# Patient Record
Sex: Male | Born: 1945 | Race: White | Hispanic: No | Marital: Married | Smoking: Former smoker
Health system: Southern US, Community
[De-identification: ages and names within clinical notes are randomized; demographics above are authoritative.]

## PROBLEM LIST (undated history)

## (undated) DIAGNOSIS — R161 Splenomegaly, not elsewhere classified: Secondary | ICD-10-CM

## (undated) DIAGNOSIS — K648 Other hemorrhoids: Secondary | ICD-10-CM

## (undated) DIAGNOSIS — M1711 Unilateral primary osteoarthritis, right knee: Secondary | ICD-10-CM

## (undated) DIAGNOSIS — E785 Hyperlipidemia, unspecified: Secondary | ICD-10-CM

## (undated) DIAGNOSIS — R0789 Other chest pain: Secondary | ICD-10-CM

## (undated) DIAGNOSIS — K644 Residual hemorrhoidal skin tags: Secondary | ICD-10-CM

## (undated) DIAGNOSIS — I491 Atrial premature depolarization: Secondary | ICD-10-CM

## (undated) DIAGNOSIS — K921 Melena: Secondary | ICD-10-CM

## (undated) DIAGNOSIS — E782 Mixed hyperlipidemia: Secondary | ICD-10-CM

## (undated) DIAGNOSIS — Z860100 Personal history of colon polyps, unspecified: Secondary | ICD-10-CM

## (undated) DIAGNOSIS — N529 Male erectile dysfunction, unspecified: Secondary | ICD-10-CM

## (undated) DIAGNOSIS — R739 Hyperglycemia, unspecified: Secondary | ICD-10-CM

## (undated) DIAGNOSIS — N200 Calculus of kidney: Secondary | ICD-10-CM

## (undated) DIAGNOSIS — R002 Palpitations: Secondary | ICD-10-CM

## (undated) DIAGNOSIS — G43109 Migraine with aura, not intractable, without status migrainosus: Secondary | ICD-10-CM

## (undated) DIAGNOSIS — R7303 Prediabetes: Secondary | ICD-10-CM

## (undated) DIAGNOSIS — R079 Chest pain, unspecified: Secondary | ICD-10-CM

## (undated) DIAGNOSIS — K635 Polyp of colon: Secondary | ICD-10-CM

## (undated) DIAGNOSIS — K649 Unspecified hemorrhoids: Secondary | ICD-10-CM

## (undated) DIAGNOSIS — M542 Cervicalgia: Secondary | ICD-10-CM

## (undated) DIAGNOSIS — K219 Gastro-esophageal reflux disease without esophagitis: Secondary | ICD-10-CM

## (undated) HISTORY — DX: Hyperglycemia, unspecified: R73.9

## (undated) HISTORY — DX: Gastro-esophageal reflux disease without esophagitis: K21.9

## (undated) HISTORY — DX: Prediabetes: R73.03

## (undated) HISTORY — DX: Other hemorrhoids: K64.8

## (undated) HISTORY — DX: Hyperlipidemia, unspecified: E78.5

## (undated) HISTORY — DX: Cervicalgia: M54.2

## (undated) HISTORY — DX: Unilateral primary osteoarthritis, right knee: M17.11

## (undated) HISTORY — DX: Personal history of colonic polyps: Z86.010

## (undated) HISTORY — PX: BONE MARROW BIOPSY: SHX199

## (undated) HISTORY — DX: Male erectile dysfunction, unspecified: N52.9

## (undated) HISTORY — DX: Chest pain, unspecified: R07.9

## (undated) HISTORY — DX: Residual hemorrhoidal skin tags: K64.4

## (undated) HISTORY — DX: Polyp of colon: K63.5

## (undated) HISTORY — DX: Migraine with aura, not intractable, without status migrainosus: G43.109

## (undated) HISTORY — DX: Atrial premature depolarization: I49.1

## (undated) HISTORY — DX: Mixed hyperlipidemia: E78.2

## (undated) HISTORY — DX: Palpitations: R00.2

## (undated) HISTORY — DX: Other chest pain: R07.89

## (undated) HISTORY — DX: Calculus of kidney: N20.0

## (undated) HISTORY — PX: KIDNEY STONE SURGERY: SHX686

## (undated) HISTORY — DX: Personal history of colon polyps, unspecified: Z86.0100

## (undated) HISTORY — DX: Unspecified hemorrhoids: K64.9

## (undated) HISTORY — DX: Melena: K92.1

---

## 1999-02-24 ENCOUNTER — Encounter: Payer: Self-pay | Admitting: Emergency Medicine

## 1999-02-24 ENCOUNTER — Emergency Department (HOSPITAL_COMMUNITY): Admission: EM | Admit: 1999-02-24 | Discharge: 1999-02-24 | Payer: Self-pay | Admitting: Emergency Medicine

## 1999-02-25 ENCOUNTER — Encounter: Payer: Self-pay | Admitting: Urology

## 1999-02-25 ENCOUNTER — Inpatient Hospital Stay (HOSPITAL_COMMUNITY): Admission: EM | Admit: 1999-02-25 | Discharge: 1999-02-26 | Payer: Self-pay | Admitting: Emergency Medicine

## 1999-02-26 ENCOUNTER — Encounter: Payer: Self-pay | Admitting: Urology

## 2007-08-11 ENCOUNTER — Ambulatory Visit (HOSPITAL_BASED_OUTPATIENT_CLINIC_OR_DEPARTMENT_OTHER): Admission: RE | Admit: 2007-08-11 | Discharge: 2007-08-11 | Payer: Self-pay | Admitting: Urology

## 2009-01-06 ENCOUNTER — Emergency Department (HOSPITAL_COMMUNITY): Admission: EM | Admit: 2009-01-06 | Discharge: 2009-01-07 | Payer: Self-pay | Admitting: Emergency Medicine

## 2011-01-28 LAB — POCT I-STAT, CHEM 8
BUN: 13 mg/dL (ref 6–23)
Calcium, Ion: 1.19 mmol/L (ref 1.12–1.32)
Chloride: 104 mEq/L (ref 96–112)
Creatinine, Ser: 1 mg/dL (ref 0.4–1.5)
Glucose, Bld: 188 mg/dL — ABNORMAL HIGH (ref 70–99)
HCT: 44 % (ref 39.0–52.0)
Hemoglobin: 15 g/dL (ref 13.0–17.0)
Potassium: 3.7 mEq/L (ref 3.5–5.1)
Sodium: 140 mEq/L (ref 135–145)
TCO2: 25 mmol/L (ref 0–100)

## 2011-01-28 LAB — CBC
HCT: 43.5 % (ref 39.0–52.0)
Hemoglobin: 14.9 g/dL (ref 13.0–17.0)
MCHC: 34.2 g/dL (ref 30.0–36.0)
MCV: 90.3 fL (ref 78.0–100.0)
Platelets: 215 10*3/uL (ref 150–400)
RBC: 4.82 MIL/uL (ref 4.22–5.81)
RDW: 14.3 % (ref 11.5–15.5)
WBC: 6.6 10*3/uL (ref 4.0–10.5)

## 2011-01-28 LAB — DIFFERENTIAL
Basophils Absolute: 0 10*3/uL (ref 0.0–0.1)
Basophils Relative: 1 % (ref 0–1)
Eosinophils Absolute: 0.1 10*3/uL (ref 0.0–0.7)
Eosinophils Relative: 2 % (ref 0–5)
Lymphocytes Relative: 29 % (ref 12–46)
Lymphs Abs: 1.9 10*3/uL (ref 0.7–4.0)
Monocytes Absolute: 0.5 10*3/uL (ref 0.1–1.0)
Monocytes Relative: 7 % (ref 3–12)
Neutro Abs: 4 10*3/uL (ref 1.7–7.7)
Neutrophils Relative %: 61 % (ref 43–77)

## 2011-01-28 LAB — POCT CARDIAC MARKERS
CKMB, poc: <1 ng/mL — ABNORMAL LOW (ref 1.0–8.0)
CKMB, poc: <1 ng/mL — ABNORMAL LOW (ref 1.0–8.0)
Myoglobin, poc: 61.6 ng/mL (ref 12–200)
Myoglobin, poc: 75 ng/mL (ref 12–200)
Troponin i, poc: <0.05 ng/mL (ref 0.00–0.09)
Troponin i, poc: <0.05 ng/mL (ref 0.00–0.09)

## 2011-03-02 NOTE — Op Note (Signed)
NAMEJIMMY, Fernando Lee                 ACCOUNT NO.:  [id number]   MEDICAL RECORD NO.:  [id number]          PATIENT TYPE:  AMB   LOCATION:  [facility]                         FACILITY:  [facility]   PHYSICIAN:  Bertram Millard. Dahlstedt, M.D.DATE OF BIRTH:  Aug 28, 1946   DATE OF PROCEDURE:  08/11/2007  DATE OF DISCHARGE:                               OPERATIVE REPORT   PREOPERATIVE DIAGNOSIS:  Right distal ureteral stone, symptomatic.   POSTOPERATIVE DIAGNOSES:  Right distal ureteral stone, symptomatic.   OPERATION PERFORMED:  1. Right cystoscopy.  2. Right ureteroscopic stone extraction.   SURGEON:  Bertram Millard. Dahlstedt, M.D.   ANESTHESIA:  General with LMA.   COMPLICATIONS:  None.   BRIEF HISTORY:  This is a 65 year old male with recurrent lower urinary  tract symptoms.  This occurred over a period of a few months.  He has a  history of stone disease.  Because of significant right lower quadrant  pain he eventually underwent CT urogram, which showed a 5 x 8 mm right  distal ureteral calculus.  Since he had been symptomatic for quite a few  months, it was evident that this stone would not pass.  He presented at  this time for ureteroscopic stone extraction.  Risks and complications  had been discussed with the patient.  He understood and desired to  proceed.   DESCRIPTION OF THE OPERATION:  The patient was administered preoperative  IV antibiotics and was brought to the operating room after the surgical  side was marked.  He was placed in the dorsal lithotomy position after a  general anesthetic was administered using LMA.  Genitalia and perineum  were prepped and draped.  The 22 French panendoscope was advanced into  his bladder.  Urethra was normal.  Prostate was normal.  No bladder  lesions were noted.  The right ureteral orifice was cannulated with a  Glidewire, which was advanced to the right renal pelvis using  fluoroscopic guidance.  The scope was then removed.  The distal ureter  was  dilated to a 23 [place] with ureteral access sheath.  At this point  the ureteroscope was passed.  The stone was encountered at about 3-4 cm  up the ureter, grasped and extracted without difficulty.  The bladder  was drained.  The procedure was terminated.   The patient tolerated the procedure well.  He was awakened and taken to  PACU in stable condition.  He will be discharged on Percocet #20 and  Bactrim DS 1 by mouth twice a day for 5 days.  He will follow up in 1-2  weeks.     Bertram Millard. Dahlstedt, M.D.  Electronically Signed    SMD/MEDQ  D:  08/11/2007  T:  08/13/2007  Job:  [id number]

## 2011-07-28 LAB — POCT HEMOGLOBIN-HEMACUE
Hemoglobin: 16.1 — ABNORMAL HIGH
Operator id: 268271

## 2012-03-08 ENCOUNTER — Encounter: Payer: Self-pay | Admitting: *Deleted

## 2012-03-08 DIAGNOSIS — R0789 Other chest pain: Secondary | ICD-10-CM | POA: Insufficient documentation

## 2012-03-08 DIAGNOSIS — E785 Hyperlipidemia, unspecified: Secondary | ICD-10-CM | POA: Insufficient documentation

## 2012-03-08 DIAGNOSIS — N200 Calculus of kidney: Secondary | ICD-10-CM | POA: Insufficient documentation

## 2012-08-24 ENCOUNTER — Encounter: Payer: Self-pay | Admitting: *Deleted

## 2012-12-28 ENCOUNTER — Encounter (INDEPENDENT_AMBULATORY_CARE_PROVIDER_SITE_OTHER): Payer: Medicare Other | Admitting: Ophthalmology

## 2012-12-28 DIAGNOSIS — H33309 Unspecified retinal break, unspecified eye: Secondary | ICD-10-CM

## 2012-12-28 DIAGNOSIS — H2510 Age-related nuclear cataract, unspecified eye: Secondary | ICD-10-CM

## 2012-12-28 DIAGNOSIS — H3530 Unspecified macular degeneration: Secondary | ICD-10-CM

## 2012-12-28 DIAGNOSIS — H43819 Vitreous degeneration, unspecified eye: Secondary | ICD-10-CM

## 2012-12-28 DIAGNOSIS — D3130 Benign neoplasm of unspecified choroid: Secondary | ICD-10-CM

## 2013-12-28 ENCOUNTER — Ambulatory Visit (INDEPENDENT_AMBULATORY_CARE_PROVIDER_SITE_OTHER): Payer: Commercial Managed Care - HMO | Admitting: Ophthalmology

## 2013-12-28 DIAGNOSIS — H43819 Vitreous degeneration, unspecified eye: Secondary | ICD-10-CM

## 2013-12-28 DIAGNOSIS — H33309 Unspecified retinal break, unspecified eye: Secondary | ICD-10-CM

## 2013-12-28 DIAGNOSIS — H2510 Age-related nuclear cataract, unspecified eye: Secondary | ICD-10-CM

## 2013-12-28 DIAGNOSIS — H3530 Unspecified macular degeneration: Secondary | ICD-10-CM

## 2013-12-28 DIAGNOSIS — D3130 Benign neoplasm of unspecified choroid: Secondary | ICD-10-CM

## 2014-11-13 ENCOUNTER — Ambulatory Visit
Admission: RE | Admit: 2014-11-13 | Discharge: 2014-11-13 | Disposition: A | Discharge status: Home / Self Care | Payer: Medicare Other | Source: Ambulatory Visit | Attending: Family Medicine | Admitting: Family Medicine

## 2014-11-13 ENCOUNTER — Other Ambulatory Visit: Payer: Self-pay | Admitting: Family Medicine

## 2014-11-13 DIAGNOSIS — R52 Pain, unspecified: Secondary | ICD-10-CM

## 2014-11-13 DIAGNOSIS — W19XXXA Unspecified fall, initial encounter: Secondary | ICD-10-CM

## 2015-01-02 ENCOUNTER — Ambulatory Visit (INDEPENDENT_AMBULATORY_CARE_PROVIDER_SITE_OTHER): Payer: Medicare Other | Admitting: Ophthalmology

## 2015-01-02 DIAGNOSIS — H3531 Nonexudative age-related macular degeneration: Secondary | ICD-10-CM

## 2015-01-02 DIAGNOSIS — H2513 Age-related nuclear cataract, bilateral: Secondary | ICD-10-CM | POA: Diagnosis not present

## 2015-01-02 DIAGNOSIS — H33302 Unspecified retinal break, left eye: Secondary | ICD-10-CM | POA: Diagnosis not present

## 2015-01-02 DIAGNOSIS — D3131 Benign neoplasm of right choroid: Secondary | ICD-10-CM | POA: Diagnosis not present

## 2015-01-02 DIAGNOSIS — H43813 Vitreous degeneration, bilateral: Secondary | ICD-10-CM

## 2015-04-28 ENCOUNTER — Ambulatory Visit
Admission: RE | Admit: 2015-04-28 | Discharge: 2015-04-28 | Disposition: A | Discharge status: Home / Self Care | Payer: Medicare Other | Source: Ambulatory Visit | Attending: Family Medicine | Admitting: Family Medicine

## 2015-04-28 ENCOUNTER — Other Ambulatory Visit: Payer: Self-pay | Admitting: Family Medicine

## 2015-04-28 DIAGNOSIS — M25562 Pain in left knee: Secondary | ICD-10-CM

## 2015-12-26 ENCOUNTER — Ambulatory Visit (INDEPENDENT_AMBULATORY_CARE_PROVIDER_SITE_OTHER): Payer: Medicare Other | Admitting: Ophthalmology

## 2015-12-26 DIAGNOSIS — H33302 Unspecified retinal break, left eye: Secondary | ICD-10-CM

## 2015-12-26 DIAGNOSIS — H353131 Nonexudative age-related macular degeneration, bilateral, early dry stage: Secondary | ICD-10-CM

## 2015-12-26 DIAGNOSIS — H2513 Age-related nuclear cataract, bilateral: Secondary | ICD-10-CM

## 2015-12-26 DIAGNOSIS — D3131 Benign neoplasm of right choroid: Secondary | ICD-10-CM | POA: Diagnosis not present

## 2015-12-26 DIAGNOSIS — H43813 Vitreous degeneration, bilateral: Secondary | ICD-10-CM

## 2016-01-02 ENCOUNTER — Ambulatory Visit (INDEPENDENT_AMBULATORY_CARE_PROVIDER_SITE_OTHER): Payer: Medicare Other | Admitting: Ophthalmology

## 2016-12-27 ENCOUNTER — Encounter (INDEPENDENT_AMBULATORY_CARE_PROVIDER_SITE_OTHER): Payer: Medicare Other | Admitting: Ophthalmology

## 2016-12-27 DIAGNOSIS — H33302 Unspecified retinal break, left eye: Secondary | ICD-10-CM | POA: Diagnosis not present

## 2016-12-27 DIAGNOSIS — D3131 Benign neoplasm of right choroid: Secondary | ICD-10-CM

## 2016-12-27 DIAGNOSIS — H353111 Nonexudative age-related macular degeneration, right eye, early dry stage: Secondary | ICD-10-CM

## 2016-12-27 DIAGNOSIS — H43813 Vitreous degeneration, bilateral: Secondary | ICD-10-CM

## 2018-01-02 ENCOUNTER — Ambulatory Visit (INDEPENDENT_AMBULATORY_CARE_PROVIDER_SITE_OTHER): Payer: Medicare Other | Admitting: Ophthalmology

## 2019-01-19 ENCOUNTER — Encounter (INDEPENDENT_AMBULATORY_CARE_PROVIDER_SITE_OTHER): Payer: Medicare Other | Admitting: Ophthalmology

## 2019-03-13 ENCOUNTER — Encounter (INDEPENDENT_AMBULATORY_CARE_PROVIDER_SITE_OTHER): Payer: Medicare Other | Admitting: Ophthalmology

## 2019-03-13 ENCOUNTER — Other Ambulatory Visit: Payer: Self-pay

## 2019-03-13 DIAGNOSIS — H353111 Nonexudative age-related macular degeneration, right eye, early dry stage: Secondary | ICD-10-CM

## 2019-03-13 DIAGNOSIS — H43813 Vitreous degeneration, bilateral: Secondary | ICD-10-CM

## 2019-03-13 DIAGNOSIS — H33302 Unspecified retinal break, left eye: Secondary | ICD-10-CM | POA: Diagnosis not present

## 2019-03-13 DIAGNOSIS — D3131 Benign neoplasm of right choroid: Secondary | ICD-10-CM | POA: Diagnosis not present

## 2019-03-13 DIAGNOSIS — H2513 Age-related nuclear cataract, bilateral: Secondary | ICD-10-CM

## 2019-11-07 ENCOUNTER — Ambulatory Visit: Payer: Medicare Other | Attending: Internal Medicine

## 2019-11-07 DIAGNOSIS — Z23 Encounter for immunization: Secondary | ICD-10-CM | POA: Insufficient documentation

## 2019-11-07 NOTE — Progress Notes (Signed)
   Covid-19 Vaccination Clinic  Name:  Fernando Lee    MRN: [id number] DOB: [birth year]  11/07/2019  [name] was observed post Covid-19 immunization for 15 minutes without incidence. He was provided with Vaccine Information Sheet and instruction to access the V-Safe system.   [name] was instructed to call 911 with any severe reactions post vaccine: [name] Kitchen Difficulty breathing  . Swelling of your face and throat  . A fast heartbeat  . A bad rash all over your body  . Dizziness and weakness    Immunizations Administered    Name Date Dose VIS Date Route   Pfizer COVID-19 Vaccine 11/07/2019  2:20 PM 0.3 mL 09/28/2019 Intramuscular   Manufacturer: [place]   Lot: [id number]   [place]: [id number]

## 2019-11-28 ENCOUNTER — Ambulatory Visit: Payer: Medicare Other | Attending: Internal Medicine

## 2019-11-28 DIAGNOSIS — Z23 Encounter for immunization: Secondary | ICD-10-CM

## 2019-11-28 NOTE — Progress Notes (Signed)
   Covid-19 Vaccination Clinic  Name:  Fernando Lee    MRN: [id number] DOB: Mar 23, 1946  11/28/2019  [name] was observed post Covid-19 immunization for 15 minutes without incidence. He was provided with Vaccine Information Sheet and instruction to access the V-Safe system.   [name] was instructed to call 911 with any severe reactions post vaccine: [name] Kitchen Difficulty breathing  . Swelling of your face and throat  . A fast heartbeat  . A bad rash all over your body  . Dizziness and weakness    Immunizations Administered    Name Date Dose VIS Date Route   Pfizer COVID-19 Vaccine 11/28/2019 10:01 AM 0.3 mL 09/28/2019 Intramuscular   Manufacturer: [employer], [employer]   Lot: [id number]   [place]: [id number]

## 2020-03-12 ENCOUNTER — Encounter (INDEPENDENT_AMBULATORY_CARE_PROVIDER_SITE_OTHER): Payer: Medicare Other | Admitting: Ophthalmology

## 2020-03-12 ENCOUNTER — Other Ambulatory Visit: Payer: Self-pay

## 2020-03-12 DIAGNOSIS — H43813 Vitreous degeneration, bilateral: Secondary | ICD-10-CM | POA: Diagnosis not present

## 2020-03-12 DIAGNOSIS — H353111 Nonexudative age-related macular degeneration, right eye, early dry stage: Secondary | ICD-10-CM

## 2020-03-12 DIAGNOSIS — H33302 Unspecified retinal break, left eye: Secondary | ICD-10-CM | POA: Diagnosis not present

## 2020-03-12 DIAGNOSIS — D3131 Benign neoplasm of right choroid: Secondary | ICD-10-CM

## 2021-01-29 DIAGNOSIS — R739 Hyperglycemia, unspecified: Secondary | ICD-10-CM | POA: Diagnosis not present

## 2021-01-29 DIAGNOSIS — I491 Atrial premature depolarization: Secondary | ICD-10-CM | POA: Diagnosis not present

## 2021-01-29 DIAGNOSIS — E782 Mixed hyperlipidemia: Secondary | ICD-10-CM | POA: Diagnosis not present

## 2021-01-29 DIAGNOSIS — R002 Palpitations: Secondary | ICD-10-CM | POA: Diagnosis not present

## 2021-02-03 DIAGNOSIS — D72829 Elevated white blood cell count, unspecified: Secondary | ICD-10-CM | POA: Diagnosis not present

## 2021-02-03 DIAGNOSIS — R899 Unspecified abnormal finding in specimens from other organs, systems and tissues: Secondary | ICD-10-CM | POA: Diagnosis not present

## 2021-02-12 ENCOUNTER — Telehealth: Payer: Self-pay | Admitting: Hematology

## 2021-02-12 NOTE — Telephone Encounter (Signed)
Received a new hem referral from Dr. Jacelyn Grip for leukocytosis and thrombocytosis. Fernando Lee has been cld and scheduled to see Dr. Irene Limbo on 5/9 at 1pm. Pt aware to arrive 20 minutes early.

## 2021-02-13 DIAGNOSIS — E782 Mixed hyperlipidemia: Secondary | ICD-10-CM | POA: Insufficient documentation

## 2021-02-13 DIAGNOSIS — K648 Other hemorrhoids: Secondary | ICD-10-CM | POA: Insufficient documentation

## 2021-02-13 DIAGNOSIS — Z860100 Personal history of colon polyps, unspecified: Secondary | ICD-10-CM | POA: Insufficient documentation

## 2021-02-13 DIAGNOSIS — K649 Unspecified hemorrhoids: Secondary | ICD-10-CM | POA: Insufficient documentation

## 2021-02-13 DIAGNOSIS — E785 Hyperlipidemia, unspecified: Secondary | ICD-10-CM | POA: Insufficient documentation

## 2021-02-13 DIAGNOSIS — M1711 Unilateral primary osteoarthritis, right knee: Secondary | ICD-10-CM | POA: Insufficient documentation

## 2021-02-13 DIAGNOSIS — R7303 Prediabetes: Secondary | ICD-10-CM | POA: Insufficient documentation

## 2021-02-13 DIAGNOSIS — M79609 Pain in unspecified limb: Secondary | ICD-10-CM | POA: Insufficient documentation

## 2021-02-13 DIAGNOSIS — K921 Melena: Secondary | ICD-10-CM | POA: Insufficient documentation

## 2021-02-13 DIAGNOSIS — R739 Hyperglycemia, unspecified: Secondary | ICD-10-CM | POA: Insufficient documentation

## 2021-02-13 DIAGNOSIS — K219 Gastro-esophageal reflux disease without esophagitis: Secondary | ICD-10-CM | POA: Insufficient documentation

## 2021-02-13 DIAGNOSIS — K644 Residual hemorrhoidal skin tags: Secondary | ICD-10-CM | POA: Insufficient documentation

## 2021-02-13 DIAGNOSIS — R079 Chest pain, unspecified: Secondary | ICD-10-CM | POA: Insufficient documentation

## 2021-02-13 DIAGNOSIS — G43109 Migraine with aura, not intractable, without status migrainosus: Secondary | ICD-10-CM | POA: Insufficient documentation

## 2021-02-13 DIAGNOSIS — I491 Atrial premature depolarization: Secondary | ICD-10-CM | POA: Insufficient documentation

## 2021-02-13 DIAGNOSIS — M542 Cervicalgia: Secondary | ICD-10-CM | POA: Insufficient documentation

## 2021-02-13 DIAGNOSIS — N529 Male erectile dysfunction, unspecified: Secondary | ICD-10-CM | POA: Insufficient documentation

## 2021-02-13 DIAGNOSIS — R002 Palpitations: Secondary | ICD-10-CM | POA: Insufficient documentation

## 2021-02-13 DIAGNOSIS — K635 Polyp of colon: Secondary | ICD-10-CM | POA: Insufficient documentation

## 2021-02-19 ENCOUNTER — Ambulatory Visit: Payer: Medicare Other | Admitting: Cardiology

## 2021-02-21 NOTE — Progress Notes (Signed)
HEMATOLOGY/ONCOLOGY CONSULTATION NOTE  Date of Service: 02/23/2021  Patient Care Team: Patient, Fernando Lee (Inactive) as PCP - General (General Practice)  CHIEF COMPLAINTS/PURPOSE OF CONSULTATION:  Leukocytosis and Thrombocytosis  HISTORY OF PRESENTING ILLNESS:   Fernando Lee is a wonderful 75 y.o. male who has been referred to [place] by Dr. Jacelyn Lee for evaluation and management of leukocytosis and thrombocytosis. The pt reports that he is doing well overall. We are joined today by the pt's wife, Fernando Lee.  The pt reports that he has been referred here by his PCP, Dr. Jacelyn Lee, on routine checkups that were done. The pt notes he was suddenly experiencing some heart fluttering that he had bene putting off for some time until he went in April to see his PCP. The pt notes they did an EKG that showed he had irregular heartbeat. The pt notes he drinks around two cups of coffee daily and his doctor instructed him to stop drinking coffee. The pt notes that Dr. Jacelyn Lee just called it irregular heartbeat, not an extra heartbeat or calling it Atrial flutter. The pt notes he had been having chocolates at night as well that had caffeine. Since stopping the caffeine intake, the pt's heartbeat had been regular.  The labs were performed as just a full checkup and the findings of thrombocytosis and leukocytosis were incidental.  The pt notes that his labs from October 2021 showed WBC of 8.7K and Plt of 373K. All prior labs to this he had the same normal levels. These labs in April were the first with irregular levels. The pt notes that his heart symptoms felt like palpitations and he would need to breathe deeper to help with this. The pt denied any supplements or medication changes in the last six months. The pt notes that after he stopped the coffee, he tried decaf and that even made the heartbeat affected again.  Outside of the palpitations, the pt has started having headaches due to caffeine withdrawals. The pt notes he  had more intermittent and milder headaches prior that is a band like feeling. The pt notes npo neurological symptoms. The pt denies being on any steroids or any recent infections. The pt notes he has lost five pounds over the last six months. He feels well currently.  The pt notes he is due for his next colonoscopy. The pt denies any smoking since 1971.   The pt denies any tick bites and notes that he has a dog at home. He denies any skin rashes or any acute infection issues. The pt denies any familial history of blood disorders. He notes that his brother had esophageal cancer and notes some issues with heart attacks. The pt notes his father passed away with cancer when he was 75 years old. The pt notes his sister has RA, but denies any joint issues. 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 This is not bothersome nor hindering to the pt.   Lab results 02/03/2021 of CBC w/diff and CMP is as follows: all values are WNL except for WBC of 16.2K, RDW of 16.0, Plt of 687K,  Neutro Abs of 11.8K, Mono Abs of 1.5K, Baso Abs of 0.5K, Eos Abs of 1.0K, Glucose of 136. 01/31/2021 CBC showed WBC of 16.2K and Plt of 699K.  On review of systems, pt reports gradual weight loss and denies vision changes, SOB, chest pain, infection issues, fevers, chills, night sweats, sudden weight changes,  sore throat, seasonal allergies, acute cough, acute back pain, chest pain, acute painful and swollen joints, and any other symptoms.  MEDICAL HISTORY:  Past Medical History:  Diagnosis Date  . Arthritis of right knee   . Cervicalgia   . Chest discomfort    normal stress echo  . Chest pain   . Colon polyps   . Dyslipidemia   . Fluttering sensation of heart   . GERD without esophagitis   . Hematochezia   . Hyperglycemia   . Hyperlipidemia   . Internal hemorrhoids   . Kidney stones   . Male erectile dysfunction, unspecified   . Mixed dyslipidemia   . Mixed  hyperlipidemia   . Ocular migraine   . PAC (premature atrial contraction)   . Personal history of colonic polyps   . Prediabetes   . Residual hemorrhoidal skin tags   . Unspecified hemorrhoids     SURGICAL HISTORY: Past Surgical History:  Procedure Laterality Date  . KIDNEY STONE SURGERY     retrieval    SOCIAL HISTORY: Social History   Socioeconomic History  . Marital status: Single    Spouse name: Not on file  . Number of children: Not on file  . Years of education: Not on file  . Highest education level: Not on file  Occupational History  . Not on file  Tobacco Use  . Smoking status: Former Smoker    Types: Cigarettes  . Smokeless tobacco: Never Used  Substance and Sexual Activity  . Alcohol use: Yes    Alcohol/week: 1.0 standard drink    Types: 1 Cans of beer Lee week  . Drug use: Not on file  . Sexual activity: Not on file  Other Topics Concern  . Not on file  Social History Narrative  . Not on file   Social Determinants of Health   Financial Resource Strain: Not on file  Food Insecurity: Not on file  Transportation Needs: Not on file  Physical Activity: Not on file  Stress: Not on file  Social Connections: Not on file  Intimate Partner Violence: Not on file    FAMILY HISTORY: Family History  Problem Relation Age of Onset  . Stroke Brother   . Congestive Heart Failure Brother     ALLERGIES:  has Fernando Known Allergies.  MEDICATIONS:  Current Outpatient Medications  Medication Sig Dispense Refill  . aspirin 81 MG tablet Take 81 mg by mouth daily.    . Biotin 1000 MCG CHEW Chew 2,000 mcg by mouth daily.    . Cholecalciferol 50 MCG (2000 UT) TABS Take 2,000 Units by mouth daily.    . fish oil-omega-3 fatty acids 1000 MG capsule Take 1 g by mouth daily.    . Flaxseed, Linseed, (FLAXSEED OIL) 1000 MG CAPS Take 1,200 mg by mouth daily.    [name] Kitchen glucosamine-chondroitin 500-400 MG tablet Take 1 tablet by mouth 3 (three) times daily.    [name] Kitchen ibuprofen (ADVIL)  200 MG tablet Take 200 mg by mouth every 6 (six) hours as needed for pain.    . Multiple Vitamin (MULTIVITAMIN) tablet Take 1 tablet by mouth daily.    . Probiotic Product (PROBIOTIC PO) Take 1 capsule by mouth daily.    . Turmeric 500 MG CAPS Take 500 mg by mouth daily.     Fernando current facility-administered medications for this visit.    REVIEW OF SYSTEMS:   10 Point review of Systems was done is negative except as noted above.  PHYSICAL EXAMINATION: ECOG  PERFORMANCE STATUS: 0 - Asymptomatic  . Vitals:   02/23/21 1307  BP: 114/69  Pulse: 67  Resp: 18  Temp: (!) 96.9 F (36.1 C)  SpO2: 100%   Filed Weights   02/23/21 1307  Weight: 174 lb 8 oz (79.2 kg)   .There is Fernando height or weight on file to calculate BMI.  GENERAL:alert, in Fernando acute distress and comfortable SKIN: Fernando acute rashes, Fernando significant lesions EYES: conjunctiva are pink and non-injected, sclera anicteric OROPHARYNX: MMM, Fernando exudates, Fernando oropharyngeal erythema or ulceration NECK: supple, Fernando JVD LYMPH:  Fernando palpable lymphadenopathy in the cervical, axillary or inguinal regions LUNGS: clear to auscultation b/l with normal respiratory effort HEART: regular rate & rhythm ABDOMEN:  normoactive bowel sounds , non tender, not distended. Extremity: Fernando pedal edema PSYCH: alert & oriented x 3 with fluent speech NEURO: Fernando focal motor/sensory deficits  LABORATORY DATA:  I have reviewed the data as listed  . CBC Latest Ref Rng & Units 02/23/2021 01/06/2009 01/06/2009  WBC 4.0 - 10.5 K/uL 18.6(H) - 6.6  Hemoglobin 13.0 - 17.0 g/dL 15.2 15.0 14.9  Hematocrit 39.0 - 52.0 % 44.9 44.0 43.5  Platelets 150 - 400 K/uL 773(H) - 215    . CMP Latest Ref Rng & Units 02/23/2021 01/06/2009  Glucose 70 - 99 mg/dL 138(H) 188(H)  BUN 8 - 23 mg/dL 18 13  Creatinine 0.61 - 1.24 mg/dL 1.12 1.0  Sodium 135 - 145 mmol/L 144 140  Potassium 3.5 - 5.1 mmol/L 4.0 3.7  Chloride 98 - 111 mmol/L 103 104  CO2 22 - 32 mmol/L 27 -  Calcium 8.9 -  10.3 mg/dL 9.6 -  Total Protein 6.5 - 8.1 g/dL 7.3 -  Total Bilirubin 0.3 - 1.2 mg/dL 0.8 -  Alkaline Phos 38 - 126 U/L 69 -  AST 15 - 41 U/L 25 -  ALT 0 - 44 U/L 30 -     RADIOGRAPHIC STUDIES: I have personally reviewed the radiological images as listed and agreed with the findings in the report. Fernando results found.  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 The pt has predominantly neutrophils with some increase in basophils and eosinophils. We want to know if this is reactive or bone marrow disorder. -Advised pt that his Plt are elevated as well. We want to know if this is clonal or reactive increase as well due to trauma, bleeding, inflammation, etc.  -Discussed workup for this-- blood tests to rule out clonal process. -Advised pt that he may need a bone marrow biopsy if the workup shows a clonal process. -Advised pt that elevated Plt increases the risk of blood clots. Start Baby ASA once daily. -Will observe for myeloproliferative neoplasms. Will try to rule out CML. -Advised pt that this most likely did not cause his heart symptoms. -Discussed taking a proactive approach due to Fernando indication for reactive process at this time and get bone marrow biopsy and blood at same time. The pt is agreeable to getting both ordered at this time. -Recommended pt continue to stay physically active and drink 48-64 oz water daily. -Advised pt to try not to worry to the best of his abilities. -Will get labs today. -Will get Bm Bx in 1 week. -Will see back in 2 weeks.    FOLLOW UP: Labs today CT bone marrow aspiration and  biopsy in 1 week RTC with Dr Irene Limbo in 2 weeks  . Orders Placed This Encounter  Procedures  . CT Biopsy    Standing Status:   Future    Standing Expiration Date:   02/23/2022    Order Specific Question:   Lab orders requested (DO NOT place  separate lab orders, these will be automatically ordered during procedure specimen collection):    Answer:   Surgical Pathology    Order Specific Question:   Reason for Exam (SYMPTOM  OR DIAGNOSIS REQUIRED)    Answer:   Unilateral bone marrow aspiration and biopsy for evaluation of suspected Myeloproliferative neoplasm with leucocytosis or thrombocytosis    Order Specific Question:   Preferred location?    Answer:   [facility]  . CT BONE MARROW BIOPSY & ASPIRATION    Standing Status:   Future    Standing Expiration Date:   02/23/2022    Order Specific Question:   Reason for Exam (SYMPTOM  OR DIAGNOSIS REQUIRED)    Answer:   Unilateral bone marrow aspiration and biopsy for evaluation of suspected Myeloproliferative neoplasm with leucocytosis or thrombocytosis    Order Specific Question:   Preferred location?    Answer:   [facility]  . CBC with Differential/Platelet    Standing Status:   Future    Number of Occurrences:   1    Standing Expiration Date:   02/23/2022  . CMP ([place] only)    Standing Status:   Future    Number of Occurrences:   1    Standing Expiration Date:   02/23/2022  . Lactate dehydrogenase    Standing Status:   Future    Number of Occurrences:   1    Standing Expiration Date:   02/23/2022  . Reticulocytes    Standing Status:   Future    Number of Occurrences:   1    Standing Expiration Date:   02/23/2022  . JAK2 (including V617F and Exon 12), MPL, and CALR-Next Generation Sequencing    Standing Status:   Future    Number of Occurrences:   1    Standing Expiration Date:   02/23/2022  . Sedimentation rate    Standing Status:   Future    Number of Occurrences:   1    Standing Expiration Date:   02/23/2022  . Multiple Myeloma Panel (SPEP&IFE w/QIG)    Standing Status:   Future    Number of Occurrences:   1    Standing Expiration Date:   02/23/2022  . ABO/RH    Standing Status:   Future    Number of Occurrences:   1    Standing Expiration Date:    02/23/2022     All of the patients questions were answered with apparent satisfaction. The patient knows to call the clinic with any problems, questions or concerns.  I spent 50 minutes counseling the patient face to face. The total time spent in the appointment was 60 minutes and more than 50% was on counseling and direct patient cares.    Sullivan Lone MD [place] AAHIVMS [facility] Hematology/Oncology Physician [facility]  (Office):       [phone] (Work cell):  [phone] (Fax):           [phone]  02/23/2021 1:48 PM  I, Reinaldo Raddle, am acting as scribe for Dr. Sullivan Lone, MD.  .I have reviewed the above documentation for accuracy and completeness, and I agree with  the above. Brunetta Genera MD

## 2021-02-23 ENCOUNTER — Inpatient Hospital Stay: Payer: Medicare Other

## 2021-02-23 ENCOUNTER — Inpatient Hospital Stay: Payer: Medicare Other | Attending: Hematology | Admitting: Hematology

## 2021-02-23 ENCOUNTER — Other Ambulatory Visit: Payer: Self-pay

## 2021-02-23 VITALS — BP 114/69 | HR 67 | Temp 96.9°F | Resp 18 | Wt 174.5 lb

## 2021-02-23 DIAGNOSIS — D72829 Elevated white blood cell count, unspecified: Secondary | ICD-10-CM | POA: Diagnosis not present

## 2021-02-23 DIAGNOSIS — Z87891 Personal history of nicotine dependence: Secondary | ICD-10-CM | POA: Insufficient documentation

## 2021-02-23 DIAGNOSIS — Z87442 Personal history of urinary calculi: Secondary | ICD-10-CM | POA: Insufficient documentation

## 2021-02-23 DIAGNOSIS — D75839 Thrombocytosis, unspecified: Secondary | ICD-10-CM

## 2021-02-23 DIAGNOSIS — K219 Gastro-esophageal reflux disease without esophagitis: Secondary | ICD-10-CM | POA: Insufficient documentation

## 2021-02-23 DIAGNOSIS — Z79899 Other long term (current) drug therapy: Secondary | ICD-10-CM | POA: Diagnosis not present

## 2021-02-23 DIAGNOSIS — Z7982 Long term (current) use of aspirin: Secondary | ICD-10-CM | POA: Insufficient documentation

## 2021-02-23 DIAGNOSIS — Z800 Family history of malignant neoplasm of digestive organs: Secondary | ICD-10-CM | POA: Diagnosis not present

## 2021-02-23 DIAGNOSIS — Z86010 Personal history of colonic polyps: Secondary | ICD-10-CM | POA: Diagnosis not present

## 2021-02-23 DIAGNOSIS — R002 Palpitations: Secondary | ICD-10-CM | POA: Insufficient documentation

## 2021-02-23 DIAGNOSIS — E785 Hyperlipidemia, unspecified: Secondary | ICD-10-CM | POA: Diagnosis not present

## 2021-02-23 LAB — CMP (CANCER CENTER ONLY)
ALT: 30 U/L (ref 0–44)
AST: 25 U/L (ref 15–41)
Albumin: 4.1 g/dL (ref 3.5–5.0)
Alkaline Phosphatase: 69 U/L (ref 38–126)
Anion gap: 14 (ref 5–15)
BUN: 18 mg/dL (ref 8–23)
CO2: 27 mmol/L (ref 22–32)
Calcium: 9.6 mg/dL (ref 8.9–10.3)
Chloride: 103 mmol/L (ref 98–111)
Creatinine: 1.12 mg/dL (ref 0.61–1.24)
GFR, Estimated: >60 mL/min (ref >60)
Glucose, Bld: 138 mg/dL — ABNORMAL HIGH (ref 70–99)
Potassium: 4 mmol/L (ref 3.5–5.1)
Sodium: 144 mmol/L (ref 135–145)
Total Bilirubin: 0.8 mg/dL (ref 0.3–1.2)
Total Protein: 7.3 g/dL (ref 6.5–8.1)

## 2021-02-23 LAB — ABO/RH: ABO/RH(D): A POS

## 2021-02-23 LAB — CBC WITH DIFFERENTIAL/PLATELET
Abs Immature Granulocytes: 0.18 10*3/uL — ABNORMAL HIGH (ref 0.00–0.07)
Basophils Absolute: 0.6 10*3/uL — ABNORMAL HIGH (ref 0.0–0.1)
Basophils Relative: 3 %
Eosinophils Absolute: 1 10*3/uL — ABNORMAL HIGH (ref 0.0–0.5)
Eosinophils Relative: 5 %
HCT: 44.9 % (ref 39.0–52.0)
Hemoglobin: 15.2 g/dL (ref 13.0–17.0)
Immature Granulocytes: 1 %
Lymphocytes Relative: 6 %
Lymphs Abs: 1.2 10*3/uL (ref 0.7–4.0)
MCH: 30.4 pg (ref 26.0–34.0)
MCHC: 33.9 g/dL (ref 30.0–36.0)
MCV: 89.8 fL (ref 80.0–100.0)
Monocytes Absolute: 1.1 10*3/uL — ABNORMAL HIGH (ref 0.1–1.0)
Monocytes Relative: 6 %
Neutro Abs: 14.5 10*3/uL — ABNORMAL HIGH (ref 1.7–7.7)
Neutrophils Relative %: 79 %
Platelets: 773 10*3/uL — ABNORMAL HIGH (ref 150–400)
RBC: 5 MIL/uL (ref 4.22–5.81)
RDW: 16.6 % — ABNORMAL HIGH (ref 11.5–15.5)
Smear Review: INCREASED
WBC: 18.6 10*3/uL — ABNORMAL HIGH (ref 4.0–10.5)
nRBC: 0 % (ref 0.0–0.2)

## 2021-02-23 LAB — RETICULOCYTES
Immature Retic Fract: 11.6 % (ref 2.3–15.9)
RBC.: 4.94 MIL/uL (ref 4.22–5.81)
Retic Count, Absolute: 88.9 10*3/uL (ref 19.0–186.0)
Retic Ct Pct: 1.8 % (ref 0.4–3.1)

## 2021-02-23 LAB — LACTATE DEHYDROGENASE: LDH: 158 U/L (ref 98–192)

## 2021-02-23 LAB — SEDIMENTATION RATE: Sed Rate: 8 mm/hr (ref 0–16)

## 2021-02-24 ENCOUNTER — Ambulatory Visit: Payer: Medicare Other | Admitting: Cardiology

## 2021-02-24 ENCOUNTER — Encounter: Payer: Self-pay | Admitting: Hematology

## 2021-02-24 ENCOUNTER — Ambulatory Visit (INDEPENDENT_AMBULATORY_CARE_PROVIDER_SITE_OTHER): Payer: Medicare Other

## 2021-02-24 ENCOUNTER — Encounter: Payer: Self-pay | Admitting: Cardiology

## 2021-02-24 VITALS — BP 108/60 | HR 59 | Ht 67.0 in | Wt 173.0 lb

## 2021-02-24 DIAGNOSIS — R002 Palpitations: Secondary | ICD-10-CM | POA: Diagnosis not present

## 2021-02-24 DIAGNOSIS — R0600 Dyspnea, unspecified: Secondary | ICD-10-CM | POA: Diagnosis not present

## 2021-02-24 DIAGNOSIS — G4733 Obstructive sleep apnea (adult) (pediatric): Secondary | ICD-10-CM | POA: Insufficient documentation

## 2021-02-24 DIAGNOSIS — E782 Mixed hyperlipidemia: Secondary | ICD-10-CM

## 2021-02-24 DIAGNOSIS — R0609 Other forms of dyspnea: Secondary | ICD-10-CM | POA: Insufficient documentation

## 2021-02-24 MED ORDER — ATORVASTATIN CALCIUM 10 MG PO TABS: 10.0000 mg | ORAL_TABLET | Freq: Every day | ORAL | 1 refills | Status: DC

## 2021-02-24 NOTE — Patient Instructions (Addendum)
Medication Instructions:  Your physician has recommended you make the following change in your medication:   START: Lipitor 10 mg daily   *If you need a refill on your cardiac medications before your next appointment, please call your pharmacy*   Lab Work: None If you have labs (blood work) drawn today and your tests are completely normal, you will receive your results only by: [name] Kitchen MyChart Message (if you have MyChart) OR . A paper copy in the mail If you have any lab test that is abnormal or we need to change your treatment, we will call you to review the results.   Testing/Procedures: Your physician has requested that you have an echocardiogram. Echocardiography is a painless test that uses sound waves to create images of your heart. It provides your doctor with information about the size and shape of your heart and how well your heart's chambers and valves are working. This procedure takes approximately one hour. There are no restrictions for this procedure.  A zio monitor was ordered today. It will remain on for 7 days. You will then return monitor and event diary in provided box. It takes 1-2 weeks for report to be downloaded and returned to [place]. We will call you with the results. If monitor falls off or has orange flashing light, please call Zio for further instructions.      Follow-Up: At [facility], you and your health needs are our priority.  As part of our continuing mission to provide you with exceptional heart care, we have created designated Provider Care Teams.  These Care Teams include your primary Cardiologist (physician) and Advanced Practice Providers (APPs -  Physician Assistants and Nurse Practitioners) who all work together to provide you with the care you need, when you need it.  We recommend signing up for the patient portal called "MyChart".  Sign up information is provided on this After Visit Summary.  MyChart is used to connect with patients for Virtual Visits  (Telemedicine).  Patients are able to view lab/test results, encounter notes, upcoming appointments, etc.  Non-urgent messages can be sent to your provider as well.   To learn more about what you can do with MyChart, go to [email or web address].    Your next appointment:   2 month(s)  The format for your next appointment:   In Person  Provider:   Jenne Campus, MD   Other Instructions   Echocardiogram An echocardiogram is a test that uses sound waves (ultrasound) to produce images of the heart. Images from an echocardiogram can provide important information about:  Heart size and shape.  The size and thickness and movement of your heart's walls.  Heart muscle function and strength.  Heart valve function or if you have stenosis. Stenosis is when the heart valves are too narrow.  If blood is flowing backward through the heart valves (regurgitation).  A tumor or infectious growth around the heart valves.  Areas of heart muscle that are not working well because of poor blood flow or injury from a heart attack.  Aneurysm detection. An aneurysm is a weak or damaged part of an artery wall. The wall bulges out from the normal force of blood pumping through the body. Tell a health care provider about:  Any allergies you have.  All medicines you are taking, including vitamins, herbs, eye drops, creams, and over-the-counter medicines.  Any blood disorders you have.  Any surgeries you have had.  Any medical conditions you have.  Whether you are pregnant or  may be pregnant. What are the risks? Generally, this is a safe test. However, problems may occur, including an allergic reaction to dye (contrast) that may be used during the test. What happens before the test? No specific preparation is needed. You may eat and drink normally. What happens during the test?  You will take off your clothes from the waist up and put on a hospital gown.  Electrodes or electrocardiogram  (ECG)patches may be placed on your chest. The electrodes or patches are then connected to a device that monitors your heart rate and rhythm.  You will lie down on a table for an ultrasound exam. A gel will be applied to your chest to help sound waves pass through your skin.  A handheld device, called a transducer, will be pressed against your chest and moved over your heart. The transducer produces sound waves that travel to your heart and bounce back (or "echo" back) to the transducer. These sound waves will be captured in real-time and changed into images of your heart that can be viewed on a video monitor. The images will be recorded on a computer and reviewed by your health care provider.  You may be asked to change positions or hold your breath for a short time. This makes it easier to get different views or better views of your heart.  In some cases, you may receive contrast through an IV in one of your veins. This can improve the quality of the pictures from your heart. The procedure may vary among health care providers and hospitals.   What can I expect after the test? You may return to your normal, everyday life, including diet, activities, and medicines, unless your health care provider tells you not to do that. Follow these instructions at home:  It is up to you to get the results of your test. Ask your health care provider, or the department that is doing the test, when your results will be ready.  Keep all follow-up visits. This is important. Summary  An echocardiogram is a test that uses sound waves (ultrasound) to produce images of the heart.  Images from an echocardiogram can provide important information about the size and shape of your heart, heart muscle function, heart valve function, and other possible heart problems.  You do not need to do anything to prepare before this test. You may eat and drink normally.  After the echocardiogram is completed, you may return to your  normal, everyday life, unless your health care provider tells you not to do that. This information is not intended to replace advice given to you by your health care provider. Make sure you discuss any questions you have with your health care provider. Document Revised: 05/27/2020 Document Reviewed: 05/27/2020 Elsevier Patient Education  2021 [employer].

## 2021-02-24 NOTE — Progress Notes (Signed)
Cardiology Consultation:    Date:  02/24/2021   ID:  Fernando Lee, DOB 02/23/1946, MRN [id number]  PCP:  Vernie Shanks, MD  Cardiologist:  Jenne Campus, MD   Referring MD: Vernie Shanks, MD   Chief Complaint  Patient presents with  . Irregular Heart Beat    History of Present Illness:    Fernando Lee is a 75 y.o. male who is being seen today for the evaluation of palpitations at the request of Vernie Shanks, MD.  Couple weeks ago he noticed palpitations he describes as fluttering in the chest and he will feel his heart skipping but no sustained arrhythmia.  There was no shortness of breath no sweating associated with this sensation.  It was making him very nervous.  He is a very active gentleman he exercised on the regular basis he walks on the regular basis he does his morning routine with exercises stretching and have no difficulty doing it.  He went to his primary care physician EKG was done he was found to have PACs he was asked to stop caffeinated drinks.  He did and his symptoms improved quite significantly.  Since that time he is doing well but still would like to have investigation done for significance of those findings.  He denies have any chest pain tightness squeezing pressure burning chest.  He is in my office with his wife and his wife tells me that he snores or not during the night he also stop breathing sometimes.  He is also very easy for him to fall asleep during the day while watching uses. He does have dyslipidemia which is not treated yet. Recently he was discovered to have high white blood cell count as well as high platelets he is scheduled to see hematology team for it. There is no family history of premature coronary artery disease He does not smoke. He exercised on the regular basis There is no syncope no dizziness, no family history of sudden cardiac death.  Past Medical History:  Diagnosis Date  . Arthritis of right knee   . Cervicalgia   . Chest  discomfort    normal stress echo  . Chest pain   . Colon polyps   . Dyslipidemia   . Fluttering sensation of heart   . GERD without esophagitis   . Hematochezia   . Hyperglycemia   . Hyperlipidemia   . Internal hemorrhoids   . Kidney stones   . Male erectile dysfunction, unspecified   . Mixed dyslipidemia   . Mixed hyperlipidemia   . Ocular migraine   . PAC (premature atrial contraction)   . Personal history of colonic polyps   . Prediabetes   . Residual hemorrhoidal skin tags   . Unspecified hemorrhoids     Past Surgical History:  Procedure Laterality Date  . KIDNEY STONE SURGERY     retrieval    Current Medications: Current Meds  Medication Sig  . atorvastatin (LIPITOR) 10 MG tablet Take 1 tablet (10 mg total) by mouth daily.  . Cholecalciferol 50 MCG (2000 UT) TABS Take 2,000 Units by mouth daily.  [name] Kitchen FIBER PO Take 1 capsule by mouth daily.  . fish oil-omega-3 fatty acids 1000 MG capsule Take 1 g by mouth daily. Strength 300mg /1500mg   . GLUCOSAMINE CHONDROITIN MSM PO Take 1 tablet by mouth daily. Strength 1500/1500  . GLUCOSAMINE-VITAMIN D PO Take 1 tablet by mouth daily. Strength 1500/1200  . Misc Natural Products (PROSTATE SUPPORT PO) Take 1  capsule by mouth daily. Unknown strength  . Multiple Vitamin (MULTIVITAMIN) tablet Take 1 tablet by mouth daily. Unknown strength  . Probiotic Product (PROBIOTIC PO) Take 1 capsule by mouth daily. Gummies Unknown strength  . psyllium (METAMUCIL) 58.6 % powder Take 1 packet by mouth daily.  . Turmeric (QC TUMERIC COMPLEX PO) Take 750 mg by mouth daily.  . vitamin C (ASCORBIC ACID) 250 MG tablet Take 500 mg by mouth daily.     Allergies:   Patient has no known allergies.   Social History   Socioeconomic History  . Marital status: Single    Spouse name: Not on file  . Number of children: Not on file  . Years of education: Not on file  . Highest education level: Not on file  Occupational History  . Not on file  Tobacco  Use  . Smoking status: Former Smoker    Types: Cigarettes  . Smokeless tobacco: Never Used  Substance and Sexual Activity  . Alcohol use: Yes    Alcohol/week: 1.0 standard drink    Types: 1 Cans of beer per week  . Drug use: Not on file  . Sexual activity: Not on file  Other Topics Concern  . Not on file  Social History Narrative  . Not on file   Social Determinants of Health   Financial Resource Strain: Not on file  Food Insecurity: Not on file  Transportation Needs: Not on file  Physical Activity: Not on file  Stress: Not on file  Social Connections: Not on file     Family History: The patient's family history includes Congestive Heart Failure in his brother; Stroke in his brother. ROS:   Please see the history of present illness.    All 14 point review of systems negative except as described per history of present illness.  EKGs/Labs/Other Studies Reviewed:    The following studies were reviewed today: Normal sinus rhythm normal P interval nonspecific ST segment changes  EKG:  EKG is  ordered today.  The ekg ordered today demonstrates normal sinus rhythm, left axis deviation, nonspecific ST segment changes  Recent Labs: 02/23/2021: ALT 30; BUN 18; Creatinine 1.12; Hemoglobin 15.2; Platelets 773; Potassium 4.0; Sodium 144  Recent Lipid Panel No results found for: CHOL, TRIG, HDL, CHOLHDL, VLDL, LDLCALC, LDLDIRECT  Physical Exam:    VS:  BP 108/60 (BP Location: Right Arm, Patient Position: Sitting)   Pulse (!) 59   Ht 5\' 7"  (1.702 m)   Wt 173 lb (78.5 kg)   SpO2 96%   BMI 27.10 kg/m     Wt Readings from Last 3 Encounters:  02/24/21 173 lb (78.5 kg)  02/23/21 174 lb 8 oz (79.2 kg)     GEN:  Well nourished, well developed in no acute distress HEENT: Normal NECK: No JVD; No carotid bruits LYMPHATICS: No lymphadenopathy CARDIAC: RRR, no murmurs, no rubs, no gallops RESPIRATORY:  Clear to auscultation without rales, wheezing or rhonchi  ABDOMEN: Soft,  non-tender, non-distended MUSCULOSKELETAL:  No edema; No deformity  SKIN: Warm and dry NEUROLOGIC:  Alert and oriented x 3 PSYCHIATRIC:  Normal affect   ASSESSMENT:    1. Palpitations   2. Fluttering sensation of heart   3. Mixed hyperlipidemia   4. Dyspnea on exertion   5. Obstructive sleep apnea    PLAN:    In order of problems listed above:  1. Palpitations, he was asked to stop drinking coffee and stop eating chocolate and palpitations practically subsided still I think it  would be reasonable to perform a Zio patch on him for about a week to see if he has still residual arrhythmias.  I also asked him to have an echocardiogram done to assess left ventricle ejection fraction. 2. Mixed dyslipidemia I did calculated his 10 years predicted risk for coronary disease that came as 21% I offered him cholesterol medication I gave him prescription for 10 mg of Lipitor, however, he wants to talk to his primary care physician before he started.  Because of his risk being more than 10% I also recommend he start taking 1 baby aspirin every single day. 3. Dyspnea on exertion I will do echocardiogram to assess left ventricle ejection fraction. 4. He does have excessive daytime somnolence, his wife described the fact that he snores at night and sometimes he stops breathing at night.  I think he does have significant sleep apnea I talked to them about potentially doing sleep study he said he want to think about it.   Medication Adjustments/Labs and Tests Ordered: Current medicines are reviewed at length with the patient today.  Concerns regarding medicines are outlined above.  Orders Placed This Encounter  Procedures  . LONG TERM MONITOR (3-14 DAYS)  . EKG 12-Lead  . ECHOCARDIOGRAM COMPLETE   Meds ordered this encounter  Medications  . atorvastatin (LIPITOR) 10 MG tablet    Sig: Take 1 tablet (10 mg total) by mouth daily.    Dispense:  90 tablet    Refill:  1    Signed, Park Liter,  MD, [facility]. 02/24/2021 12:01 PM    [facility]

## 2021-02-25 ENCOUNTER — Telehealth: Payer: Self-pay | Admitting: Hematology

## 2021-02-25 NOTE — Telephone Encounter (Signed)
Left message with follow-up appointment per 5/9 los. Gave option to call back to reschedule if needed. 

## 2021-02-27 LAB — MULTIPLE MYELOMA PANEL, SERUM
Albumin SerPl Elph-Mcnc: 3.9 g/dL (ref 2.9–4.4)
Albumin/Glob SerPl: 1.6 (ref 0.7–1.7)
Alpha 1: 0.2 g/dL (ref 0.0–0.4)
Alpha2 Glob SerPl Elph-Mcnc: 0.8 g/dL (ref 0.4–1.0)
B-Globulin SerPl Elph-Mcnc: 0.9 g/dL (ref 0.7–1.3)
Gamma Glob SerPl Elph-Mcnc: 0.7 g/dL (ref 0.4–1.8)
Globulin, Total: 2.6 g/dL (ref 2.2–3.9)
IgA: 217 mg/dL (ref 61–437)
IgG (Immunoglobin G), Serum: 853 mg/dL (ref 603–1613)
IgM (Immunoglobulin M), Srm: 20 mg/dL (ref 15–143)
Total Protein ELP: 6.5 g/dL (ref 6.0–8.5)

## 2021-03-03 DIAGNOSIS — R002 Palpitations: Secondary | ICD-10-CM

## 2021-03-03 LAB — BCR ABL1 FISH (GENPATH)

## 2021-03-03 LAB — JAK2 (INCLUDING V617F AND EXON 12), MPL,& CALR-NEXT GEN SEQ

## 2021-03-05 ENCOUNTER — Other Ambulatory Visit: Payer: Self-pay | Admitting: Radiology

## 2021-03-06 ENCOUNTER — Other Ambulatory Visit: Payer: Self-pay | Admitting: Radiology

## 2021-03-06 DIAGNOSIS — R002 Palpitations: Secondary | ICD-10-CM | POA: Diagnosis not present

## 2021-03-09 ENCOUNTER — Other Ambulatory Visit: Payer: Self-pay

## 2021-03-09 ENCOUNTER — Ambulatory Visit (HOSPITAL_COMMUNITY)
Admission: RE | Admit: 2021-03-09 | Discharge: 2021-03-09 | Disposition: A | Discharge status: Home / Self Care | Payer: Medicare Other | Source: Ambulatory Visit | Attending: Hematology | Admitting: Hematology

## 2021-03-09 ENCOUNTER — Encounter (HOSPITAL_COMMUNITY): Payer: Self-pay

## 2021-03-09 DIAGNOSIS — Z8249 Family history of ischemic heart disease and other diseases of the circulatory system: Secondary | ICD-10-CM | POA: Insufficient documentation

## 2021-03-09 DIAGNOSIS — Z87891 Personal history of nicotine dependence: Secondary | ICD-10-CM | POA: Insufficient documentation

## 2021-03-09 DIAGNOSIS — D75839 Thrombocytosis, unspecified: Secondary | ICD-10-CM | POA: Insufficient documentation

## 2021-03-09 DIAGNOSIS — D72829 Elevated white blood cell count, unspecified: Secondary | ICD-10-CM

## 2021-03-09 DIAGNOSIS — D721 Eosinophilia, unspecified: Secondary | ICD-10-CM | POA: Diagnosis not present

## 2021-03-09 DIAGNOSIS — D72824 Basophilia: Secondary | ICD-10-CM | POA: Diagnosis not present

## 2021-03-09 DIAGNOSIS — D72821 Monocytosis (symptomatic): Secondary | ICD-10-CM | POA: Diagnosis not present

## 2021-03-09 DIAGNOSIS — D47Z9 Other specified neoplasms of uncertain behavior of lymphoid, hematopoietic and related tissue: Secondary | ICD-10-CM | POA: Diagnosis not present

## 2021-03-09 DIAGNOSIS — D72828 Other elevated white blood cell count: Secondary | ICD-10-CM | POA: Diagnosis not present

## 2021-03-09 LAB — CBC WITH DIFFERENTIAL/PLATELET
Abs Immature Granulocytes: 0.36 10*3/uL — ABNORMAL HIGH (ref 0.00–0.07)
Basophils Absolute: 0.6 10*3/uL — ABNORMAL HIGH (ref 0.0–0.1)
Basophils Relative: 3 %
Eosinophils Absolute: 1.2 10*3/uL — ABNORMAL HIGH (ref 0.0–0.5)
Eosinophils Relative: 6 %
HCT: 41.2 % (ref 39.0–52.0)
Hemoglobin: 13.8 g/dL (ref 13.0–17.0)
Immature Granulocytes: 2 %
Lymphocytes Relative: 9 %
Lymphs Abs: 1.7 10*3/uL (ref 0.7–4.0)
MCH: 30.5 pg (ref 26.0–34.0)
MCHC: 33.5 g/dL (ref 30.0–36.0)
MCV: 91.2 fL (ref 80.0–100.0)
Monocytes Absolute: 1.5 10*3/uL — ABNORMAL HIGH (ref 0.1–1.0)
Monocytes Relative: 8 %
Neutro Abs: 13.9 10*3/uL — ABNORMAL HIGH (ref 1.7–7.7)
Neutrophils Relative %: 72 %
Platelets: 827 10*3/uL — ABNORMAL HIGH (ref 150–400)
RBC: 4.52 MIL/uL (ref 4.22–5.81)
RDW: 16.9 % — ABNORMAL HIGH (ref 11.5–15.5)
WBC: 19.2 10*3/uL — ABNORMAL HIGH (ref 4.0–10.5)
nRBC: 0 % (ref 0.0–0.2)

## 2021-03-09 LAB — PROTIME-INR
INR: 1.3 — ABNORMAL HIGH (ref 0.8–1.2)
Prothrombin Time: 16.1 seconds — ABNORMAL HIGH (ref 11.4–15.2)

## 2021-03-09 MED ORDER — NALOXONE HCL 0.4 MG/ML IJ SOLN
INTRAMUSCULAR | Status: AC
  Filled 2021-03-09: qty 1

## 2021-03-09 MED ORDER — FLUMAZENIL 0.5 MG/5ML IV SOLN
INTRAVENOUS | Status: AC
  Filled 2021-03-09: qty 5

## 2021-03-09 MED ORDER — FENTANYL CITRATE (PF) 100 MCG/2ML IJ SOLN
INTRAMUSCULAR | Status: AC
  Filled 2021-03-09: qty 2

## 2021-03-09 MED ORDER — FENTANYL CITRATE (PF) 100 MCG/2ML IJ SOLN
INTRAMUSCULAR | Status: AC | PRN
  Administered 2021-03-09: 25 ug via INTRAVENOUS
  Administered 2021-03-09: 50 ug via INTRAVENOUS

## 2021-03-09 MED ORDER — MIDAZOLAM HCL 2 MG/2ML IJ SOLN
INTRAMUSCULAR | Status: AC
  Filled 2021-03-09: qty 4

## 2021-03-09 MED ORDER — SODIUM CHLORIDE 0.9 % IV SOLN
INTRAVENOUS | Status: AC
  Filled 2021-03-09: qty 250

## 2021-03-09 MED ORDER — MIDAZOLAM HCL 2 MG/2ML IJ SOLN
INTRAMUSCULAR | Status: AC | PRN
  Administered 2021-03-09 (×2): 1 mg via INTRAVENOUS

## 2021-03-09 MED ORDER — LIDOCAINE HCL 1 % IJ SOLN
INTRAMUSCULAR | Status: AC | PRN
  Administered 2021-03-09: 10 mL via INTRADERMAL

## 2021-03-09 MED ORDER — SODIUM CHLORIDE 0.9 % IV SOLN: INTRAVENOUS | Status: DC

## 2021-03-09 NOTE — Discharge Instructions (Signed)
Please call Interventional Radiology clinic [phone] with any questions or concerns.  You may remove your dressing and shower tomorrow.   Bone Marrow Aspiration and Bone Marrow Biopsy, Adult, Care After This sheet gives you information about how to care for yourself after your procedure. Your health care provider may also give you more specific instructions. If you have problems or questions, contact your health care provider. What can I expect after the procedure? After the procedure, it is common to have:  Mild pain and tenderness.  Swelling.  Bruising. Follow these instructions at home: Puncture site care 1. Follow instructions from your health care provider about how to take care of the puncture site. Make sure you: ? Wash your hands with soap and water before and after you change your bandage (dressing). If soap and water are not available, use hand sanitizer. ? Change your dressing as told by your health care provider. 2. Check your puncture site every day for signs of infection. Check for: ? More redness, swelling, or pain. ? Fluid or blood. ? Warmth. ? Pus or a bad smell.   Activity 1. Return to your normal activities as told by your health care provider. Ask your health care provider what activities are safe for you. 2. Do not lift anything that is heavier than 10 lb (4.5 kg), or the limit that you are told, until your health care provider says that it is safe. 3. Do not drive for 24 hours if you were given a sedative during your procedure. General instructions 1. Take over-the-counter and prescription medicines only as told by your health care provider. 2. Do not take baths, swim, or use a hot tub until your health care provider approves. Ask your health care provider if you may take showers. You may only be allowed to take sponge baths. 3. If directed, put ice on the affected area. To do this: ? Put ice in a plastic bag. ? Place a towel between your skin and the  bag. ? Leave the ice on for 20 minutes, 2-3 times a day. 4. Keep all follow-up visits as told by your health care provider. This is important.   Contact a health care provider if:  Your pain is not controlled with medicine.  You have a fever.  You have more redness, swelling, or pain around the puncture site.  You have fluid or blood coming from the puncture site.  Your puncture site feels warm to the touch.  You have pus or a bad smell coming from the puncture site. Summary  After the procedure, it is common to have mild pain, tenderness, swelling, and bruising.  Follow instructions from your health care provider about how to take care of the puncture site and what activities are safe for you.  Take over-the-counter and prescription medicines only as told by your health care provider.  Contact a health care provider if you have any signs of infection, such as fluid or blood coming from the puncture site. This information is not intended to replace advice given to you by your health care provider. Make sure you discuss any questions you have with your health care provider. Document Revised: 02/20/2019 Document Reviewed: 02/20/2019 Elsevier Patient Education  2021 [place].   Moderate Conscious Sedation, Adult, Care After This sheet gives you information about how to care for yourself after your procedure. Your health care provider may also give you more specific instructions. If you have problems or questions, contact your health care provider. What  can I expect after the procedure? After the procedure, it is common to have:  Sleepiness for several hours.  Impaired judgment for several hours.  Difficulty with balance.  Vomiting if you eat too soon. Follow these instructions at home: For the time period you were told by your health care provider: 3. Rest. 4. Do not participate in activities where you could fall or become injured. 5. Do not drive or use  machinery. 6. Do not drink alcohol. 7. Do not take sleeping pills or medicines that cause drowsiness. 8. Do not make important decisions or sign legal documents. 9. Do not take care of children on your own.      Eating and drinking 4. Follow the diet recommended by your health care provider. 5. Drink enough fluid to keep your urine pale yellow. 6. If you vomit: ? Drink water, juice, or soup when you can drink without vomiting. ? Make sure you have little or no nausea before eating solid foods.   General instructions 5. Take over-the-counter and prescription medicines only as told by your health care provider. 6. Have a responsible adult stay with you for the time you are told. It is important to have someone help care for you until you are awake and alert. 7. Do not smoke. 8. Keep all follow-up visits as told by your health care provider. This is important. Contact a health care provider if:  You are still sleepy or having trouble with balance after 24 hours.  You feel light-headed.  You keep feeling nauseous or you keep vomiting.  You develop a rash.  You have a fever.  You have redness or swelling around the IV site. Get help right away if:  You have trouble breathing.  You have new-onset confusion at home. Summary  After the procedure, it is common to feel sleepy, have impaired judgment, or feel nauseous if you eat too soon.  Rest after you get home. Know the things you should not do after the procedure.  Follow the diet recommended by your health care provider and drink enough fluid to keep your urine pale yellow.  Get help right away if you have trouble breathing or new-onset confusion at home. This information is not intended to replace advice given to you by your health care provider. Make sure you discuss any questions you have with your health care provider. Document Revised: 02/01/2020 Document Reviewed: 08/30/2019 Elsevier Patient Education  2021 Elsevier  Inc.      

## 2021-03-09 NOTE — Consult Note (Signed)
Chief Complaint: Patient was seen in consultation today for CT-guided bone marrow biopsy  Referring Physician(s): Brunetta Genera  Supervising Physician: Daryll Brod  Patient Status: [name]  History of Present Illness: Fernando Lee is a 75 y.o. 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  He presents today for CT-guided bone marrow biopsy for further evaluation.  Additional medical history as below.  Past Medical History:  Diagnosis Date  . Arthritis of right knee   . Cervicalgia   . Chest discomfort    normal stress echo  . Chest pain   . Colon polyps   . Dyslipidemia   . Fluttering sensation of heart   . GERD without esophagitis   . Hematochezia   . Hyperglycemia   . Hyperlipidemia   . Internal hemorrhoids   . Kidney stones   . Male erectile dysfunction, unspecified   . Mixed dyslipidemia   . Mixed hyperlipidemia   . Ocular migraine   . PAC (premature atrial contraction)   . Personal history of colonic polyps   . Prediabetes   . Residual hemorrhoidal skin tags   . Unspecified hemorrhoids     Past Surgical History:  Procedure Laterality Date  . KIDNEY STONE SURGERY     retrieval    Allergies: Patient has no known allergies.  Medications: Prior to Admission medications   Medication Sig Start Date End Date Taking? Authorizing Provider  atorvastatin (LIPITOR) 10 MG tablet Take 1 tablet (10 mg total) by mouth daily. 02/24/21 05/25/21  Park Liter, MD  Cholecalciferol 50 MCG (2000 UT) TABS Take 2,000 Units by mouth daily.    [provider]  FIBER PO Take 1 capsule by mouth daily.    [provider]  fish oil-omega-3 fatty acids 1000 MG capsule Take 1 g by mouth daily. Strength 322m/1500mg    [provider]  GLUCOSAMINE CHONDROITIN MSM PO Take 1 tablet by mouth daily. Strength 1500/1500    [provider]  GLUCOSAMINE-VITAMIN D PO Take 1 tablet by  mouth daily. Strength 1500/1200    [provider]  Misc Natural Products (PROSTATE SUPPORT PO) Take 1 capsule by mouth daily. Unknown strength    [provider]  Multiple Vitamin (MULTIVITAMIN) tablet Take 1 tablet by mouth daily. Unknown strength    [provider]  Probiotic Product (PROBIOTIC PO) Take 1 capsule by mouth daily. Gummies Unknown strength    [provider]  psyllium (METAMUCIL) 58.6 % powder Take 1 packet by mouth daily.    [provider]  Turmeric (QC TUMERIC COMPLEX PO) Take 750 mg by mouth daily.    [provider]  vitamin C (ASCORBIC ACID) 250 MG tablet Take 500 mg by mouth daily.    [provider]     Family History  Problem Relation Age of Onset  . Stroke Brother   . Congestive Heart Failure Brother     Social History   Socioeconomic History  . Marital status: Married    Spouse name: Not on file  . Number of children: Not on file  . Years of education: Not on file  . Highest education level: Not on file  Occupational History  . Not on file  Tobacco Use  . Smoking status: Former Smoker    Types: Cigarettes  . Smokeless tobacco: Never Used  Substance and Sexual Activity  . Alcohol use: Yes    Alcohol/week: 1.0 standard drink    Types: 1  Cans of beer per week  . Drug use: Not on file  . Sexual activity: Not on file  Other Topics Concern  . Not on file  Social History Narrative  . Not on file   Social Determinants of Health   Financial Resource Strain: Not on file  Food Insecurity: Not on file  Transportation Needs: Not on file  Physical Activity: Not on file  Stress: Not on file  Social Connections: Not on file     Review of Systems denies fever, headache, chest pain, dyspnea, cough, abdominal/back pain, nausea, vomiting or bleeding.  Vital Signs: pending  Physical Exam awake, alert.  Chest clear to auscultation bilaterally.  Heart with regular rate and rhythm.  Abdomen  soft, positive bowel sounds, nontender.  No lower extremity edema.  Imaging: No results found.  Labs:  CBC: Recent Labs    02/23/21 1357  WBC 18.6*  HGB 15.2  HCT 44.9  PLT 773*    COAGS: No results for input(s): INR, APTT in the last 8760 hours.  BMP: Recent Labs    02/23/21 1357  NA 144  K 4.0  CL 103  CO2 27  GLUCOSE 138*  BUN 18  CALCIUM 9.6  CREATININE 1.12  GFRNONAA >60    LIVER FUNCTION TESTS: Recent Labs    02/23/21 1357  BILITOT 0.8  AST 25  ALT 30  ALKPHOS 69  PROT 7.3  ALBUMIN 4.1    TUMOR MARKERS: No results for input(s): AFPTM, CEA, CA199, CHROMGRNA in the last 8760 hours.  Assessment and Plan: 75 y.o. 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  He presents today for CT-guided bone marrow biopsy for further evaluation. Risks and benefits of procedure was discussed with the patient  including, but not limited to bleeding, infection, damage to adjacent structures or low yield requiring additional tests.  All of the questions were answered and there is agreement to proceed.  Consent signed and in chart.     Thank you for this interesting consult.  I greatly enjoyed meeting Fernando Lee and look forward to participating in their care.  A copy of this report was sent to the requesting provider on this date.  Electronically Signed: D. Rowe Robert, PA-C 03/09/2021, 9:29 AM   I spent a total of 20 minutes    in face to face in clinical consultation, greater than 50% of which was counseling/coordinating care for CT-guided bone marrow biopsy

## 2021-03-09 NOTE — Procedures (Signed)
Interventional Radiology Procedure Note  Procedure: CT BONE MARROW ASP AND CORE    Complications: None  Estimated Blood Loss:  MIN  Findings: 11 G CORE BX AND ASP PATH PENDING    M. Daryll Brod, MD

## 2021-03-16 NOTE — Progress Notes (Signed)
HEMATOLOGY/ONCOLOGY CONSULTATION NOTE  Date of Service: 03/17/2021  Patient Care Team: Fernando Shanks, MD as PCP - General (Family Medicine)  CHIEF COMPLAINTS/PURPOSE OF CONSULTATION:  Leukocytosis and Thrombocytosis  HISTORY OF PRESENTING ILLNESS:   Fernando Lee is a wonderful 75 y.o. male who has been referred to [place] by Fernando Lee for evaluation and management of leukocytosis and thrombocytosis. The pt reports that he is doing well overall. We are joined today by the pt's wife, Fernando Lee.  The pt reports that he has been referred here by his PCP, Fernando Lee, on routine checkups that were done. The pt notes he was suddenly experiencing some heart fluttering that he had bene putting off for some time until he went in April to see his PCP. The pt notes they did an EKG that showed he had irregular heartbeat. The pt notes he drinks around two cups of coffee daily and his doctor instructed him to stop drinking coffee. The pt notes that Fernando Lee just called it irregular heartbeat, not an extra heartbeat or calling it Atrial flutter. The pt notes he had been having chocolates at night as well that had caffeine. Since stopping the caffeine intake, the pt's heartbeat had been regular.  The labs were performed as just a full checkup and the findings of thrombocytosis and leukocytosis were incidental.  The pt notes that his labs from October 2021 showed WBC of 8.7K and Plt of 373K. All prior labs to this he had the same normal levels. These labs in April were the first with irregular levels. The pt notes that his heart symptoms felt like palpitations and he would need to breathe deeper to help with this. The pt denied any supplements or medication changes in the last six months. The pt notes that after he stopped the coffee, he tried decaf and that even made the heartbeat affected again.  Outside of the palpitations, the pt has started having headaches due to caffeine withdrawals. The pt notes he had more  intermittent and milder headaches prior that is a band like feeling. The pt notes npo neurological symptoms. The pt denies being on any steroids or any recent infections. The pt notes he has lost five pounds over the last six months. He feels well currently.  The pt notes he is due for his next colonoscopy. The pt denies any smoking since 1971.   The pt denies any tick bites and notes that he has a dog at home. He denies any skin rashes or any acute infection issues. The pt denies any familial history of blood disorders. He notes that his brother had esophageal cancer and notes some issues with heart attacks. The pt notes his father passed away with cancer when he was 75 years old. The pt notes his sister has RA, but denies any joint issues. 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 This is not bothersome nor hindering to the pt.   Lab results 02/03/2021 of CBC w/diff and CMP is as follows: all values are WNL except for WBC of 16.2K, RDW of 16.0, Plt of 687K,  Neutro Abs of 11.8K, Mono Abs of 1.5K, Baso Abs of 0.5K, Eos Abs of 1.0K, Glucose of 136. 01/31/2021 CBC showed WBC of 16.2K and Plt of 699K.  On review of systems, pt reports gradual weight loss and denies vision changes, SOB, chest pain, infection issues, fevers, chills, night sweats, sudden weight changes, sore  throat, seasonal allergies, acute cough, acute back pain, chest pain, acute painful and swollen joints, and any other symptoms.  INTERVAL HISTORY: Fernando Lee is here today for f/u regarding evaluation and management of leukocytosis and thrombocytosis. The patient's last visit with [place] was on 02/23/2021. The pt reports that he is doing well overall.  The pt reports no new symptoms or concerns. He notes some bother with the bone marrow biopsy he had, but this is improving.   Of note since the patient's last visit, pt has had CT Bone Marrow Biopsy & Aspiration ([id number]) on  03/09/2021, which revealed "- Marked thrombocytosis  - Leukocytosis with neutrophilia, monocytosis, eosinophilia, and  basophilia  - See CBC data and comment   COMMENT:   Per report from [facility], molecular testing of the patient's peripheral  blood in May 2022 was positive for the JAK2 V617F mutation, and negative  for mutations in CALR and MPL. FISH testing was negative for BCR-ABL1  gene rearrangement. 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  Correlation with clinical impressions and cytogenetic results is  recommended. "  Lab results 02/23/2021 of CBC w/diff and CMP is as follows: all values are WNL except for WBC of 18.6K, RDW of 16.6, lt of 773K, Neutro Abs of 14.5K, Monocytes Abs of 1.1K, Basophils Abs of 0.6K, Eos Abs of 1.0K, Glucose of 138. 02/23/2021 MMP WNL. 02/23/2021 LDH of 158. 02/23/2021 Sed Rate of 8. 02/23/2021 ABO/RH A Pos. 02/23/2021 Retic Ct Pct of 1.8, Immature Retic Fract of 11.6. 02/23/2021 JAK2 abnormal. 02/23/2021 BCR ABL normal.  On review of systems, pt denies back pain, abdominal pain, SOB, chest pain, leg swelling, and any other symptoms.  MEDICAL HISTORY:  Past Medical History:  Diagnosis Date  . Arthritis of right knee   . Cervicalgia   . Chest discomfort    normal stress echo  . Chest pain   . Colon polyps   . Dyslipidemia   . Fluttering sensation of heart   . GERD without esophagitis   . Hematochezia   . Hyperglycemia   . Hyperlipidemia   . Internal hemorrhoids   . Kidney stones   . Male erectile dysfunction, unspecified   . Mixed dyslipidemia   . Mixed hyperlipidemia   . Ocular migraine   . PAC (premature atrial contraction)   . Personal history of colonic polyps   . Prediabetes   . Residual hemorrhoidal skin tags   . Unspecified hemorrhoids     SURGICAL HISTORY: Past Surgical  History:  Procedure Laterality Date  . KIDNEY STONE SURGERY     retrieval    SOCIAL HISTORY: Social History   Socioeconomic History  . Marital status: Married    Spouse name: Not on file  . Number of children: Not on file  . Years of education: Not on file  . Highest education level: Not on file  Occupational History  . Not on file  Tobacco Use  . Smoking status: Former Smoker    Types: Cigarettes  . Smokeless tobacco: Never Used  Substance and Sexual Activity  . Alcohol use: Yes    Alcohol/week: 1.0 standard drink    Types: 1 Cans of beer per week  . Drug use: Not on file  . Sexual activity: Not on file  Other Topics Concern  . Not on file  Social History Narrative  . Not on file  Social Determinants of Health   Financial Resource Strain: Not on file  Food Insecurity: Not on file  Transportation Needs: Not on file  Physical Activity: Not on file  Stress: Not on file  Social Connections: Not on file  Intimate Partner Violence: Not on file    FAMILY HISTORY: Family History  Problem Relation Age of Onset  . Stroke Brother   . Congestive Heart Failure Brother     ALLERGIES:  has No Known Allergies.  MEDICATIONS:  Current Outpatient Medications  Medication Sig Dispense Refill  . aspirin EC 81 MG tablet Take 81 mg by mouth daily. Swallow whole.    . docusate sodium (COLACE) 100 MG capsule Take 100 mg by mouth 2 (two) times daily.    . hydroxyurea (HYDREA) 500 MG capsule Take 1 capsule by mouth daily 5 days a week (Monday through Friday). May take with food to minimize GI side effects. 30 capsule 1  . atorvastatin (LIPITOR) 10 MG tablet Take 1 tablet (10 mg total) by mouth daily. 90 tablet 1  . Cholecalciferol 50 MCG (2000 UT) TABS Take 2,000 Units by mouth daily.    [name] Kitchen FIBER PO Take 1 capsule by mouth daily.    . fish oil-omega-3 fatty acids 1000 MG capsule Take 1 g by mouth daily. Strength 349m/1500mg    . GLUCOSAMINE CHONDROITIN MSM PO Take 1 tablet by  mouth daily. Strength 1500/1500    . GLUCOSAMINE-VITAMIN D PO Take 1 tablet by mouth daily. Strength 1500/1200    . Misc Natural Products (PROSTATE SUPPORT PO) Take 1 capsule by mouth daily. Unknown strength    . Multiple Vitamin (MULTIVITAMIN) tablet Take 1 tablet by mouth daily. Unknown strength    . Probiotic Product (PROBIOTIC PO) Take 1 capsule by mouth daily. Gummies Unknown strength    . psyllium (METAMUCIL) 58.6 % powder Take 1 packet by mouth daily.    . Turmeric (QC TUMERIC COMPLEX PO) Take 750 mg by mouth daily.    . vitamin C (ASCORBIC ACID) 250 MG tablet Take 500 mg by mouth daily.     No current facility-administered medications for this visit.    REVIEW OF SYSTEMS:   10 Point review of Systems was done is negative except as noted above.  PHYSICAL EXAMINATION: ECOG PERFORMANCE STATUS: 0 - Asymptomatic  . Vitals:   03/17/21 1534  BP: 111/63  Pulse: 73  Resp: 17  Temp: 98.3 F (36.8 C)  SpO2: 98%   Filed Weights   03/17/21 1534  Weight: 172 lb 4.8 oz (78.2 kg)   .Body mass index is 26.99 kg/m.  NAD. GENERAL:alert, in no acute distress and comfortable SKIN: no acute rashes, no significant lesions EYES: conjunctiva are pink and non-injected, sclera anicteric OROPHARYNX: MMM, no exudates, no oropharyngeal erythema or ulceration NECK: supple, no JVD LYMPH:  no palpable lymphadenopathy in the cervical, axillary or inguinal regions LUNGS: clear to auscultation b/l with normal respiratory effort HEART: regular rate & rhythm ABDOMEN:  normoactive bowel sounds , non tender, not distended. Extremity: no pedal edema PSYCH: alert & oriented x 3 with fluent speech NEURO: no focal motor/sensory deficits  LABORATORY DATA:  I have reviewed the data as listed  . CBC Latest Ref Rng & Units 03/09/2021 02/23/2021 01/06/2009  WBC 4.0 - 10.5 K/uL 19.2(H) 18.6(H) -  Hemoglobin 13.0 - 17.0 g/dL 13.8 15.2 15.0  Hematocrit 39.0 - 52.0 % 41.2 44.9 44.0  Platelets 150 - 400 K/uL  827(H) 773(H) -    . CMP  Latest Ref Rng & Units 02/23/2021 01/06/2009  Glucose 70 - 99 mg/dL 138(H) 188(H)  BUN 8 - 23 mg/dL 18 13  Creatinine 0.61 - 1.24 mg/dL 1.12 1.0  Sodium 135 - 145 mmol/L 144 140  Potassium 3.5 - 5.1 mmol/L 4.0 3.7  Chloride 98 - 111 mmol/L 103 104  CO2 22 - 32 mmol/L 27 -  Calcium 8.9 - 10.3 mg/dL 9.6 -  Total Protein 6.5 - 8.1 g/dL 7.3 -  Total Bilirubin 0.3 - 1.2 mg/dL 0.8 -  Alkaline Phos 38 - 126 U/L 69 -  AST 15 - 41 U/L 25 -  ALT 0 - 44 U/L 30 -   02/23/2021 BCR ABL    02/23/2021 JAK2     RADIOGRAPHIC STUDIES: I have personally reviewed the radiological images as listed and agreed with the findings in the report. CT Biopsy  Result Date: 03/09/2021 INDICATION: Myeloproliferative neoplasm EXAM: CT GUIDED RIGHT ILIAC BONE MARROW ASPIRATION AND CORE BIOPSY Date:  03/09/2021 03/09/2021 12:43 pm Radiologist:  Jerilynn Mages. Daryll Brod, MD Guidance:  CT FLUOROSCOPY TIME:  Fluoroscopy Time: None. MEDICATIONS: 1% lidocaine ANESTHESIA/SEDATION: 2.0 mg IV Versed; 75 mcg IV Fentanyl Moderate Sedation Time:  10 minutes The patient was continuously monitored during the procedure by the interventional radiology nurse under my direct supervision. CONTRAST:  None. COMPLICATIONS: None PROCEDURE: Informed consent was obtained from the patient following explanation of the procedure, risks, benefits and alternatives. The patient understands, agrees and consents for the procedure. All questions were addressed. A time out was performed. The patient was positioned prone and non-contrast localization CT was performed of the pelvis to demonstrate the iliac marrow spaces. Maximal barrier sterile technique utilized including caps, mask, sterile gowns, sterile gloves, large sterile drape, hand hygiene, and Betadine prep. Under sterile conditions and local anesthesia, an 11 gauge coaxial bone biopsy needle was advanced into the right iliac marrow space. Needle position was confirmed with CT imaging.  Initially, bone marrow aspiration was performed. Next, the 11 gauge outer cannula was utilized to obtain a right iliac bone marrow core biopsy. Needle was removed. Hemostasis was obtained with compression. The patient tolerated the procedure well. Samples were prepared with the cytotechnologist. No immediate complications. IMPRESSION: CT guided right iliac bone marrow aspiration and core biopsy. Electronically Signed   By: Jerilynn Mages.  Shick M.D.   On: 03/09/2021 13:00   CT BONE MARROW BIOPSY & ASPIRATION  Result Date: 03/09/2021 INDICATION: Myeloproliferative neoplasm EXAM: CT GUIDED RIGHT ILIAC BONE MARROW ASPIRATION AND CORE BIOPSY Date:  03/09/2021 03/09/2021 12:43 pm Radiologist:  Jerilynn Mages. Daryll Brod, MD Guidance:  CT FLUOROSCOPY TIME:  Fluoroscopy Time: None. MEDICATIONS: 1% lidocaine ANESTHESIA/SEDATION: 2.0 mg IV Versed; 75 mcg IV Fentanyl Moderate Sedation Time:  10 minutes The patient was continuously monitored during the procedure by the interventional radiology nurse under my direct supervision. CONTRAST:  None. COMPLICATIONS: None PROCEDURE: Informed consent was obtained from the patient following explanation of the procedure, risks, benefits and alternatives. The patient understands, agrees and consents for the procedure. All questions were addressed. A time out was performed. The patient was positioned prone and non-contrast localization CT was performed of the pelvis to demonstrate the iliac marrow spaces. Maximal barrier sterile technique utilized including caps, mask, sterile gowns, sterile gloves, large sterile drape, hand hygiene, and Betadine prep. Under sterile conditions and local anesthesia, an 11 gauge coaxial bone biopsy needle was advanced into the right iliac marrow space. Needle position was confirmed with CT imaging. Initially, bone marrow aspiration was performed. Next,  the 11 gauge outer cannula was utilized to obtain a right iliac bone marrow core biopsy. Needle was removed. Hemostasis was  obtained with compression. The patient tolerated the procedure well. Samples were prepared with the cytotechnologist. No immediate complications. IMPRESSION: CT guided right iliac bone marrow aspiration and core biopsy. Electronically Signed   By: Jerilynn Mages.  Shick M.D.   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 WBC also high.  -Discussed pt CT Bone Marrow Biopsy & Aspiration ([id number]) on 03/09/2021; 80% of bone marrow. Very small amount of scarring. -Advised pt that he does have the JAK2 mutation, but normal BCR ABL. He has the [id number] mutation. -Advised pt he does not have Polycythemia Vera, as this is primarily increase in RBC. -Advised pt he could have either JAK2 positive Essential Thrombocytosis (increased Plt primarily) or primary myelofibrosis. Discussed differences between these two. 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 -Discussed essential Thrombocytosis causing secondary myelofibrosis versus primary myelofibrosis. Advised pt it is very difficult to differentiate between these two and does not change the treatment. -Advised pt that he did not have any blasts on his smear and thus no acute leukemia. Thr risk of progression to acute leukemia is 1% each year. -Will start on low dose of Hydroxyurea for goal Plt < 400K. Will start with conservative dose and gradually adjust for needs over months. Will start with 500 mg M-F. Sat and Sun off.  -Recommended pt wear sunscreen and proper skin protection as Hydroxyurea increases skin sensitivity to the sun. -Cautioned pt against using herbal or dietary supplements, as these are not regulated by the FDA.  -Advised pt to avoid drinking grapefruit  juice. -Discussed consideration of bone marrow transplant down the line if this is primary myelofibrosis that causes bone marrow scarring. -Advised pt that if this is primary myelofibrosis, then he would be more sensitive to the Hydroxyurea. -Recommended pt continue to stay physically active and drink 48-64 oz water daily. -Recommended pt receive his first COVID booster.  -Discussed Evusheld and pt's eligibility. Will send referral. -Recommended pt avoid excess alcohol intake. -Advised pt this is typically not a germ-line mutation and is acquired, not inherited. -Continue Baby ASA daily. -Will get [place] Abd for baseline liver/spleen size. -Continue Multivitamin daily. -Will set up for education session. -Will see back in 6 weeks with labs. -Will get labs and toxicity check with NP in 3 weeks.    FOLLOW UP: [place] abd  in 1 week Labs and f/u with Murray Hodgkins in 3 weeks for toxicity check RTC with Dr Irene Limbo with labs in 6 weeks Referral for Evusheld   No orders of the defined types were placed in this encounter.    All of the patients questions were answered with apparent satisfaction. The patient knows to call the clinic with any problems, questions or concerns.  The total time spent in the appointment was 30 minutes and more than 50% was on counseling and direct patient cares.     Sullivan Lone MD [place] AAHIVMS [facility] Hematology/Oncology Physician [facility]  (Office):       [phone] (Work cell):  [phone] (Fax):           [phone]  03/17/2021  4:46 PM  I, Reinaldo Raddle, am acting as scribe for Dr. Sullivan Lone, MD.  .I have reviewed the above documentation for accuracy and completeness, and I agree with the above. Brunetta Genera MD

## 2021-03-17 ENCOUNTER — Inpatient Hospital Stay (HOSPITAL_BASED_OUTPATIENT_CLINIC_OR_DEPARTMENT_OTHER): Payer: Medicare Other | Admitting: Hematology

## 2021-03-17 ENCOUNTER — Other Ambulatory Visit: Payer: Self-pay

## 2021-03-17 ENCOUNTER — Other Ambulatory Visit (HOSPITAL_COMMUNITY): Payer: Self-pay

## 2021-03-17 VITALS — BP 111/63 | HR 73 | Temp 98.3°F | Resp 17 | Wt 172.3 lb

## 2021-03-17 DIAGNOSIS — E785 Hyperlipidemia, unspecified: Secondary | ICD-10-CM | POA: Diagnosis not present

## 2021-03-17 DIAGNOSIS — Z1589 Genetic susceptibility to other disease: Secondary | ICD-10-CM

## 2021-03-17 DIAGNOSIS — D471 Chronic myeloproliferative disease: Secondary | ICD-10-CM

## 2021-03-17 DIAGNOSIS — Z87891 Personal history of nicotine dependence: Secondary | ICD-10-CM | POA: Diagnosis not present

## 2021-03-17 DIAGNOSIS — Z86010 Personal history of colonic polyps: Secondary | ICD-10-CM | POA: Diagnosis not present

## 2021-03-17 DIAGNOSIS — Z800 Family history of malignant neoplasm of digestive organs: Secondary | ICD-10-CM | POA: Diagnosis not present

## 2021-03-17 DIAGNOSIS — Z7982 Long term (current) use of aspirin: Secondary | ICD-10-CM | POA: Diagnosis not present

## 2021-03-17 DIAGNOSIS — D72829 Elevated white blood cell count, unspecified: Secondary | ICD-10-CM | POA: Diagnosis not present

## 2021-03-17 DIAGNOSIS — R002 Palpitations: Secondary | ICD-10-CM | POA: Diagnosis not present

## 2021-03-17 DIAGNOSIS — D75839 Thrombocytosis, unspecified: Secondary | ICD-10-CM | POA: Diagnosis not present

## 2021-03-17 DIAGNOSIS — Z79899 Other long term (current) drug therapy: Secondary | ICD-10-CM | POA: Diagnosis not present

## 2021-03-17 DIAGNOSIS — Z87442 Personal history of urinary calculi: Secondary | ICD-10-CM | POA: Diagnosis not present

## 2021-03-17 DIAGNOSIS — K219 Gastro-esophageal reflux disease without esophagitis: Secondary | ICD-10-CM | POA: Diagnosis not present

## 2021-03-17 MED ORDER — HYDROXYUREA 500 MG PO CAPS
ORAL_CAPSULE | ORAL | 1 refills | Status: DC
  Filled 2021-03-17: qty 30, 42d supply, fill #0
  Filled 2021-04-21: qty 30, 42d supply, fill #1

## 2021-03-18 ENCOUNTER — Telehealth: Payer: Self-pay | Admitting: Hematology

## 2021-03-18 NOTE — Telephone Encounter (Signed)
Left message with follow-up appointments per 5/31 los. Gave option to call back to reschedule if needed. 

## 2021-03-19 ENCOUNTER — Encounter (INDEPENDENT_AMBULATORY_CARE_PROVIDER_SITE_OTHER): Payer: Medicare Other | Admitting: Ophthalmology

## 2021-03-19 ENCOUNTER — Other Ambulatory Visit: Payer: Self-pay

## 2021-03-19 DIAGNOSIS — H2513 Age-related nuclear cataract, bilateral: Secondary | ICD-10-CM

## 2021-03-19 DIAGNOSIS — D3131 Benign neoplasm of right choroid: Secondary | ICD-10-CM

## 2021-03-19 DIAGNOSIS — H43813 Vitreous degeneration, bilateral: Secondary | ICD-10-CM

## 2021-03-19 DIAGNOSIS — H33302 Unspecified retinal break, left eye: Secondary | ICD-10-CM | POA: Diagnosis not present

## 2021-03-20 ENCOUNTER — Encounter (HOSPITAL_COMMUNITY): Payer: Self-pay | Admitting: Hematology

## 2021-03-23 ENCOUNTER — Other Ambulatory Visit: Payer: Self-pay

## 2021-03-23 ENCOUNTER — Ambulatory Visit (HOSPITAL_COMMUNITY): Payer: Medicare Other | Attending: Internal Medicine

## 2021-03-23 ENCOUNTER — Telehealth: Payer: Self-pay | Admitting: Hematology

## 2021-03-23 ENCOUNTER — Telehealth: Payer: Self-pay

## 2021-03-23 DIAGNOSIS — R002 Palpitations: Secondary | ICD-10-CM | POA: Diagnosis not present

## 2021-03-23 DIAGNOSIS — R0600 Dyspnea, unspecified: Secondary | ICD-10-CM

## 2021-03-23 LAB — ECHOCARDIOGRAM COMPLETE
Area-P 1/2: 2.83 cm2
S' Lateral: 2.6 cm

## 2021-03-23 MED ORDER — PERFLUTREN LIPID MICROSPHERE
1.0000 mL | INTRAVENOUS | Status: AC | PRN
  Administered 2021-03-23: 1 mL via INTRAVENOUS

## 2021-03-23 NOTE — Telephone Encounter (Signed)
Spoke w/ patient results discussed however he would rather wait for upcoming ECHO results and then discuss weather or not he should be on this med. Patient states since he has stopped the caffeine, his heart flutter has stopped. Message sent to Dr. Agustin Cree.

## 2021-03-23 NOTE — Telephone Encounter (Signed)
Scheduled appointment per 06/06 sch msg. Patient is aware. 

## 2021-03-23 NOTE — Telephone Encounter (Signed)
-----   Message from Park Liter, MD sent at 03/18/2021  9:30 PM EDT ----- Monitor showed some supraventricular tachycardia as well as 1 episode of ventricular tachycardia.  Best way to approach this is to start metoprolol tartrate 25 twice daily

## 2021-03-26 LAB — SURGICAL PATHOLOGY

## 2021-03-31 DIAGNOSIS — I491 Atrial premature depolarization: Secondary | ICD-10-CM | POA: Diagnosis not present

## 2021-03-31 DIAGNOSIS — Z0000 Encounter for general adult medical examination without abnormal findings: Secondary | ICD-10-CM | POA: Diagnosis not present

## 2021-03-31 DIAGNOSIS — K219 Gastro-esophageal reflux disease without esophagitis: Secondary | ICD-10-CM | POA: Diagnosis not present

## 2021-03-31 DIAGNOSIS — E782 Mixed hyperlipidemia: Secondary | ICD-10-CM | POA: Diagnosis not present

## 2021-03-31 DIAGNOSIS — Z86010 Personal history of colonic polyps: Secondary | ICD-10-CM | POA: Diagnosis not present

## 2021-03-31 DIAGNOSIS — R739 Hyperglycemia, unspecified: Secondary | ICD-10-CM | POA: Diagnosis not present

## 2021-03-31 DIAGNOSIS — G43109 Migraine with aura, not intractable, without status migrainosus: Secondary | ICD-10-CM | POA: Diagnosis not present

## 2021-04-03 ENCOUNTER — Other Ambulatory Visit: Payer: Self-pay

## 2021-04-03 ENCOUNTER — Ambulatory Visit (HOSPITAL_COMMUNITY)
Admission: RE | Admit: 2021-04-03 | Discharge: 2021-04-03 | Disposition: A | Discharge status: Home / Self Care | Payer: Medicare Other | Source: Ambulatory Visit | Attending: Hematology | Admitting: Hematology

## 2021-04-03 DIAGNOSIS — K824 Cholesterolosis of gallbladder: Secondary | ICD-10-CM | POA: Diagnosis not present

## 2021-04-03 DIAGNOSIS — D471 Chronic myeloproliferative disease: Secondary | ICD-10-CM | POA: Diagnosis present

## 2021-04-03 DIAGNOSIS — Z1589 Genetic susceptibility to other disease: Secondary | ICD-10-CM

## 2021-04-03 DIAGNOSIS — D6852 Prothrombin gene mutation: Secondary | ICD-10-CM | POA: Diagnosis not present

## 2021-04-03 DIAGNOSIS — E875 Hyperkalemia: Secondary | ICD-10-CM | POA: Diagnosis not present

## 2021-04-06 ENCOUNTER — Other Ambulatory Visit: Payer: Self-pay | Admitting: Physician Assistant

## 2021-04-06 DIAGNOSIS — Z1589 Genetic susceptibility to other disease: Secondary | ICD-10-CM

## 2021-04-06 DIAGNOSIS — D471 Chronic myeloproliferative disease: Secondary | ICD-10-CM

## 2021-04-07 ENCOUNTER — Other Ambulatory Visit: Payer: Self-pay

## 2021-04-07 ENCOUNTER — Inpatient Hospital Stay: Payer: Medicare Other | Attending: Physician Assistant | Admitting: Physician Assistant

## 2021-04-07 ENCOUNTER — Inpatient Hospital Stay: Payer: Medicare Other

## 2021-04-07 VITALS — BP 121/60 | HR 74 | Temp 98.1°F | Resp 19 | Ht 67.0 in | Wt 173.2 lb

## 2021-04-07 DIAGNOSIS — Z7982 Long term (current) use of aspirin: Secondary | ICD-10-CM | POA: Insufficient documentation

## 2021-04-07 DIAGNOSIS — Z86010 Personal history of colonic polyps: Secondary | ICD-10-CM | POA: Insufficient documentation

## 2021-04-07 DIAGNOSIS — D471 Chronic myeloproliferative disease: Secondary | ICD-10-CM | POA: Diagnosis not present

## 2021-04-07 DIAGNOSIS — Z1589 Genetic susceptibility to other disease: Secondary | ICD-10-CM

## 2021-04-07 DIAGNOSIS — E785 Hyperlipidemia, unspecified: Secondary | ICD-10-CM | POA: Insufficient documentation

## 2021-04-07 DIAGNOSIS — K219 Gastro-esophageal reflux disease without esophagitis: Secondary | ICD-10-CM | POA: Diagnosis not present

## 2021-04-07 LAB — CBC WITH DIFFERENTIAL (CANCER CENTER ONLY)
Abs Immature Granulocytes: 0.27 10*3/uL — ABNORMAL HIGH (ref 0.00–0.07)
Basophils Absolute: 0.4 10*3/uL — ABNORMAL HIGH (ref 0.0–0.1)
Basophils Relative: 3 %
Eosinophils Absolute: 0.7 10*3/uL — ABNORMAL HIGH (ref 0.0–0.5)
Eosinophils Relative: 5 %
HCT: 36.4 % — ABNORMAL LOW (ref 39.0–52.0)
Hemoglobin: 12.2 g/dL — ABNORMAL LOW (ref 13.0–17.0)
Immature Granulocytes: 2 %
Lymphocytes Relative: 7 %
Lymphs Abs: 1 10*3/uL (ref 0.7–4.0)
MCH: 31.9 pg (ref 26.0–34.0)
MCHC: 33.5 g/dL (ref 30.0–36.0)
MCV: 95 fL (ref 80.0–100.0)
Monocytes Absolute: 0.8 10*3/uL (ref 0.1–1.0)
Monocytes Relative: 5 %
Neutro Abs: 10.9 10*3/uL — ABNORMAL HIGH (ref 1.7–7.7)
Neutrophils Relative %: 78 %
Platelet Count: 673 10*3/uL — ABNORMAL HIGH (ref 150–400)
RBC: 3.83 MIL/uL — ABNORMAL LOW (ref 4.22–5.81)
RDW: 18.5 % — ABNORMAL HIGH (ref 11.5–15.5)
WBC Count: 14 10*3/uL — ABNORMAL HIGH (ref 4.0–10.5)
nRBC: 0 % (ref 0.0–0.2)

## 2021-04-07 LAB — CMP (CANCER CENTER ONLY)
ALT: 27 U/L (ref 0–44)
AST: 23 U/L (ref 15–41)
Albumin: 3.7 g/dL (ref 3.5–5.0)
Alkaline Phosphatase: 59 U/L (ref 38–126)
Anion gap: 9 (ref 5–15)
BUN: 18 mg/dL (ref 8–23)
CO2: 26 mmol/L (ref 22–32)
Calcium: 9.4 mg/dL (ref 8.9–10.3)
Chloride: 105 mmol/L (ref 98–111)
Creatinine: 1.13 mg/dL (ref 0.61–1.24)
GFR, Estimated: >60 mL/min (ref >60)
Glucose, Bld: 169 mg/dL — ABNORMAL HIGH (ref 70–99)
Potassium: 4.7 mmol/L (ref 3.5–5.1)
Sodium: 140 mmol/L (ref 135–145)
Total Bilirubin: 0.6 mg/dL (ref 0.3–1.2)
Total Protein: 6.2 g/dL — ABNORMAL LOW (ref 6.5–8.1)

## 2021-04-07 NOTE — Progress Notes (Signed)
HEMATOLOGY/ONCOLOGY PROGRESS NOTE:   Date of Service: 04/09/2021  Patient Care Team: Vernie Shanks, MD as PCP - General (Family Medicine)  CHIEF COMPLAINTS:  JAK2-positive myeloproliferative neoplasm, primary myelofibrosis.   INTERVAL HISTORY: Fernando Lee is here today for JAK2-positive myeloproliferative neoplasm, primary myelofibrosis. He was last seen on 03/17/2021 and was started on Hydroxyurea. He is accompanied by his wife for this visit. Fernando Lee continues to do well without any significant limitations. He reports that his energy and appetite levels are stable. He continues to complete all his ADLs on his own. Patient denies any nausea, vomiting or abdominal pain. He experienced constipation that resolved with colace. He denies any easy bruising or signs of bleeding.Patient denies any fevers, chills, night sweats, shortness of breath, chest pain or cough. He has no other complaints.  MEDICAL HISTORY:  Past Medical History:  Diagnosis Date   Arthritis of right knee    Cervicalgia    Chest discomfort    normal stress echo   Chest pain    Colon polyps    Dyslipidemia    Fluttering sensation of heart    GERD without esophagitis    Hematochezia    Hyperglycemia    Hyperlipidemia    Internal hemorrhoids    Kidney stones    Male erectile dysfunction, unspecified    Mixed dyslipidemia    Mixed hyperlipidemia    Ocular migraine    PAC (premature atrial contraction)    Personal history of colonic polyps    Prediabetes    Residual hemorrhoidal skin tags    Unspecified hemorrhoids     SURGICAL HISTORY: Past Surgical History:  Procedure Laterality Date   KIDNEY STONE SURGERY     retrieval    SOCIAL HISTORY: Social History   Socioeconomic History   Marital status: Married    Spouse name: Not on file   Number of children: Not on file   Years of education: Not on file   Highest education level: Not on file  Occupational History   Not on file  Tobacco Use    Smoking status: Former    Pack years: 0.00    Types: Cigarettes   Smokeless tobacco: Never  Substance and Sexual Activity   Alcohol use: Yes    Alcohol/week: 1.0 standard drink    Types: 1 Cans of beer per week   Drug use: Not on file   Sexual activity: Not on file  Other Topics Concern   Not on file  Social History Narrative   Not on file   Social Determinants of Health   Financial Resource Strain: Not on file  Food Insecurity: Not on file  Transportation Needs: Not on file  Physical Activity: Not on file  Stress: Not on file  Social Connections: Not on file  Intimate Partner Violence: Not on file    FAMILY HISTORY: Family History  Problem Relation Age of Onset   Stroke Brother    Congestive Heart Failure Brother     ALLERGIES:  has No Known Allergies.  MEDICATIONS:  Current Outpatient Medications  Medication Sig Dispense Refill   aspirin EC 81 MG tablet Take 81 mg by mouth daily. Swallow whole.     atorvastatin (LIPITOR) 10 MG tablet Take 1 tablet (10 mg total) by mouth daily. 90 tablet 1   Cholecalciferol 50 MCG (2000 UT) TABS Take 2,000 Units by mouth daily.     docusate sodium (COLACE) 100 MG capsule Take 100 mg by mouth 2 (two) times  daily.     FIBER PO Take 1 capsule by mouth daily.     fish oil-omega-3 fatty acids 1000 MG capsule Take 1 g by mouth daily. Strength 300mg /1500mg      GLUCOSAMINE CHONDROITIN MSM PO Take 1 tablet by mouth daily. Strength 1500/1500     GLUCOSAMINE-VITAMIN D PO Take 1 tablet by mouth daily. Strength 1500/1200     hydroxyurea (HYDREA) 500 MG capsule Take 1 capsule by mouth daily 5 days a week (Monday through Friday). May take with food to minimize GI side effects. 30 capsule 1   Misc Natural Products (PROSTATE SUPPORT PO) Take 1 capsule by mouth daily. Unknown strength     Multiple Vitamin (MULTIVITAMIN) tablet Take 1 tablet by mouth daily. Unknown strength     Probiotic Product (PROBIOTIC PO) Take 1 capsule by mouth daily. Gummies  Unknown strength     psyllium (METAMUCIL) 58.6 % powder Take 1 packet by mouth daily.     Turmeric (QC TUMERIC COMPLEX PO) Take 750 mg by mouth daily.     vitamin C (ASCORBIC ACID) 250 MG tablet Take 500 mg by mouth daily.     No current facility-administered medications for this visit.    REVIEW OF SYSTEMS:   Review of Systems  Constitutional:  Negative for chills, fever and weight loss.  HENT:  Negative for congestion, nosebleeds and sore throat.   Respiratory:  Negative for cough, shortness of breath and wheezing.   Cardiovascular:  Negative for chest pain, palpitations and leg swelling.  Gastrointestinal:  Negative for abdominal pain, blood in stool, diarrhea, melena, nausea and vomiting.  Genitourinary:  Negative for hematuria.  Skin:  Negative for rash.  Neurological:  Negative for dizziness and headaches.  Endo/Heme/Allergies:  Does not bruise/bleed easily.    PHYSICAL EXAMINATION: ECOG PERFORMANCE STATUS: 0 - Asymptomatic  . Vitals:   04/07/21 1452  BP: 121/60  Pulse: 74  Resp: 19  Temp: 98.1 F (36.7 C)  SpO2: 98%   Filed Weights   04/07/21 1452  Weight: 173 lb 3.2 oz (78.6 kg)   .Body mass index is 27.13 kg/m.  NAD. GENERAL:alert, in no acute distress and comfortable SKIN: no acute rashes, no significant lesions EYES: conjunctiva are pink and non-injected, sclera anicteric OROPHARYNX: MMM, no exudates, no oropharyngeal erythema or ulceration NECK: supple, no JVD LYMPH:  no palpable lymphadenopathy in the cervical, axillary or inguinal regions LUNGS: clear to auscultation b/l with normal respiratory effort HEART: regular rate & rhythm ABDOMEN:  normoactive bowel sounds , non tender, not distended. Extremity: no pedal edema PSYCH: alert & oriented x 3 with fluent speech NEURO: no focal motor/sensory deficits  LABORATORY DATA:  I have reviewed the data as listed  . CBC Latest Ref Rng & Units 04/07/2021 03/09/2021 02/23/2021  WBC 4.0 - 10.5 K/uL 14.0(H)  19.2(H) 18.6(H)  Hemoglobin 13.0 - 17.0 g/dL 12.2(L) 13.8 15.2  Hematocrit 39.0 - 52.0 % 36.4(L) 41.2 44.9  Platelets 150 - 400 K/uL 673(H) 827(H) 773(H)    . CMP Latest Ref Rng & Units 04/07/2021 02/23/2021 01/06/2009  Glucose 70 - 99 mg/dL 169(H) 138(H) 188(H)  BUN 8 - 23 mg/dL 18 18 13   Creatinine 0.61 - 1.24 mg/dL 1.13 1.12 1.0  Sodium 135 - 145 mmol/L 140 144 140  Potassium 3.5 - 5.1 mmol/L 4.7 4.0 3.7  Chloride 98 - 111 mmol/L 105 103 104  CO2 22 - 32 mmol/L 26 27 -  Calcium 8.9 - 10.3 mg/dL 9.4 9.6 -  Total Protein  6.5 - 8.1 g/dL 6.2(L) 7.3 -  Total Bilirubin 0.3 - 1.2 mg/dL 0.6 0.8 -  Alkaline Phos 38 - 126 U/L 59 69 -  AST 15 - 41 U/L 23 25 -  ALT 0 - 44 U/L 27 30 -   02/23/2021 BCR ABL    02/23/2021 JAK2     RADIOGRAPHIC STUDIES: I have personally reviewed the radiological images as listed and agreed with the findings in the report. US Abdomen Complete  Result Date: 04/03/2021 CLINICAL DATA:  Newly diagnosed gene mutation EXAM: ABDOMEN ULTRASOUND COMPLETE COMPARISON:  Ultrasound 05/30/2020 FINDINGS: Gallbladder: Tiny polyp measuring 3 mm. No shadowing stone. Negative sonographic Murphy. Normal wall thickness Common bile duct: Diameter: 7.6 mm Liver: No focal hepatic abnormality. Right hepatic lobe extends inferior to the lower pole of right kidney suggesting enlargement. Portal vein is patent on color Doppler imaging with normal direction of blood flow towards the liver. IVC: No abnormality visualized. Pancreas: Visualized portion unremarkable. Spleen: Echogenicity within normal limits. Subjectively appears enlarged, length measurement of 14.6 cm (my measurement) Right Kidney: Length: 10.7 cm. Echogenicity within normal limits. No mass or hydronephrosis visualized. Left Kidney: Length: 11.7 cm. Echogenicity within normal limits. No mass or hydronephrosis visualized. Abdominal aorta: No aneurysm visualized. Other findings: None. IMPRESSION: 1. Tiny gallbladder polyp. 2.  Probable liver and spleen enlargement. 3. Otherwise negative examination Electronically Signed   By: Donavan Foil M.D.   On: 04/03/2021 21:58   ECHOCARDIOGRAM COMPLETE  Result Date: 03/23/2021    ECHOCARDIOGRAM REPORT   Patient Name:   Fernando Lee Date of Exam: 03/23/2021 Medical Rec #:  [id number]     Height:       67.0 in Accession #:    [id number]    Weight:       172.3 lb Date of Birth:  06-05-1946      BSA:          1.898 m Patient Age:    26 years      BP:           111/63 mmHg Patient Gender: M             HR:           54 bpm. Exam Location:  [facility] Procedure: 2D Echo, Cardiac Doppler, Color Doppler and Intracardiac            Opacification Agent Indications:    R00.2 Palpitation  History:        Patient has prior history of Echocardiogram examinations, most                 recent 02/03/2009. Arrythmias:PAC, Signs/Symptoms:Shortness of                 Breath; Risk Factors:Sleep Apnea, Dyslipidemia, Former Smoker                 and Pre-diabetes.  Sonographer:    [place] Referring Phys: [id number] [place] Liter  Sonographer Comments: Technically difficult study due to poor echo windows and suboptimal apical window. IMPRESSIONS  1. Left ventricular ejection fraction, by estimation, is 60 to 65%. The left ventricle has normal function. The left ventricle has no regional wall motion abnormalities. There is mild concentric left ventricular hypertrophy. Left ventricular diastolic parameters are indeterminate. Elevated left atrial pressure.  2. Right ventricular systolic function is normal. The right ventricular size is normal. Tricuspid regurgitation signal is inadequate for assessing PA pressure.  3. The mitral valve is  normal in structure. Trivial mitral valve regurgitation. No evidence of mitral stenosis.  4. The aortic valve is tricuspid. Aortic valve regurgitation is trivial. No aortic stenosis is present.  5. The inferior vena cava is normal in size with greater than 50% respiratory  variability, suggesting right atrial pressure of 3 mmHg. Comparison(s): Prior images unable to be directly viewed, comparison made by report only. Based on 2010 report (from prior stress), LVEF is slightly more dynamic. FINDINGS  Left Ventricle: Left ventricular ejection fraction, by estimation, is 60 to 65%. The left ventricle has normal function. The left ventricle has no regional wall motion abnormalities. Definity contrast agent was given IV to delineate the left ventricular  endocardial borders. The left ventricular internal cavity size was normal in size. There is mild concentric left ventricular hypertrophy. Left ventricular diastolic parameters are indeterminate. Elevated left atrial pressure. Right Ventricle: The right ventricular size is normal. No increase in right ventricular wall thickness. Right ventricular systolic function is normal. Tricuspid regurgitation signal is inadequate for assessing PA pressure. Left Atrium: Left atrial size was normal in size. Right Atrium: Right atrial size was normal in size. Pericardium: There is no evidence of pericardial effusion. Mitral Valve: The mitral valve is normal in structure. Trivial mitral valve regurgitation. No evidence of mitral valve stenosis. Tricuspid Valve: The tricuspid valve is normal in structure. Tricuspid valve regurgitation is trivial. No evidence of tricuspid stenosis. Aortic Valve: The aortic valve is tricuspid. Aortic valve regurgitation is trivial. No aortic stenosis is present. Pulmonic Valve: The pulmonic valve was grossly normal. Pulmonic valve regurgitation is mild. No evidence of pulmonic stenosis. Aorta: The aortic root and ascending aorta are structurally normal, with no evidence of dilitation. Venous: The inferior vena cava is normal in size with greater than 50% respiratory variability, suggesting right atrial pressure of 3 mmHg. IAS/Shunts: The atrial septum is grossly normal.  LEFT VENTRICLE PLAX 2D LVIDd:         4.20 cm   Diastology LVIDs:         2.60 cm  LV e' medial:    7.35 cm/s LV PW:         1.20 cm  LV E/e' medial:  16.9 LV IVS:        1.00 cm  LV e' lateral:   7.58 cm/s LVOT diam:     2.00 cm  LV E/e' lateral: 16.4 LV SV:         87 LV SV Index:   46 LVOT Area:     3.14 cm  RIGHT VENTRICLE RV Basal diam:  3.40 cm RV S prime:     12.80 cm/s TAPSE (M-mode): 2.1 cm LEFT ATRIUM             Index       RIGHT ATRIUM           Index LA diam:        3.20 cm 1.69 cm/m  RA Pressure: 3.00 mmHg LA Vol (A2C):   33.3 ml 17.54 ml/m RA Area:     17.60 cm LA Vol (A4C):   25.7 ml 13.54 ml/m RA Volume:   49.70 ml  26.18 ml/m LA Biplane Vol: 29.5 ml 15.54 ml/m  AORTIC VALVE LVOT Vmax:   128.00 cm/s LVOT Vmean:  80.900 cm/s LVOT VTI:    0.276 m  AORTA Ao Root diam: 3.60 cm Ao Asc diam:  3.90 cm MITRAL VALVE  TRICUSPID VALVE                             Estimated RAP:  3.00 mmHg  MV E velocity: 124.00 cm/s  SHUNTS MV A velocity: 103.00 cm/s  Systemic VTI:  0.28 m MV E/A ratio:  1.20         Systemic Diam: 2.00 cm Rudean Haskell MD Electronically signed by Rudean Haskell MD Signature Date/Time: 03/23/2021/1:48:06 PM    Final      ASSESSMENT & PLAN:  1)JAK2-positive myeloproliferative neoplasm, primary myelofibrosis: -On 03/17/2021, started on Hydroxyurea 500 mg with directions to take 1 capsule by mouth daily for 5 days, Monday through Friday. Holding on Saturday and Sunday.  -Goal Plt < 400K.  -Recommend to take ASA 81 mg daily -Abdominal US from 04/03/2021 revealed probable liver and spleen enlargement -Patient returns today, 04/07/2021 for a follow up visit. Labs from today were reviewed that shows improvement of leukocytosis with WBC now 14.0 and thrombocytosis with Plt of 673K. There is mild anemia with hemoglobin of 12.2, likely secondary to Hydroxyurea. Recommend to continue with treatment. He will return to the clinic on 05/20/2021 for a follow up visit with Dr. Demiah Gullickson Limbo.   Orders Placed This Encounter   Procedures   CBC with Differential ([facility] [facility] Only)    Standing Status:   Future    Standing Expiration Date:   04/09/2022   CMP ([facility] only)    Standing Status:   Future    Standing Expiration Date:   04/09/2022      All of the patients questions were answered with apparent satisfaction. The patient knows to call the clinic with any problems, questions or concerns.  I have spent a total of 25 minutes minutes of face-to-face and non-face-to-face time, preparing to see the patient, obtaining and/or reviewing separately obtained history, performing a medically appropriate examination, counseling and educating the patient, ordering tests, documenting clinical information in the electronic health record,and care coordination.   Lincoln Brigham PA-C Hematology and Oncology [facility] [place] P: [phone]

## 2021-04-09 DIAGNOSIS — Z1589 Genetic susceptibility to other disease: Secondary | ICD-10-CM

## 2021-04-09 DIAGNOSIS — D471 Chronic myeloproliferative disease: Secondary | ICD-10-CM | POA: Insufficient documentation

## 2021-04-09 HISTORY — DX: Genetic susceptibility to other disease: Z15.89

## 2021-04-21 ENCOUNTER — Other Ambulatory Visit (HOSPITAL_COMMUNITY): Payer: Self-pay

## 2021-04-29 ENCOUNTER — Ambulatory Visit: Payer: Medicare Other | Admitting: Hematology

## 2021-04-29 ENCOUNTER — Other Ambulatory Visit: Payer: Medicare Other

## 2021-05-07 ENCOUNTER — Other Ambulatory Visit: Payer: Self-pay

## 2021-05-07 ENCOUNTER — Encounter: Payer: Self-pay | Admitting: Cardiology

## 2021-05-07 ENCOUNTER — Ambulatory Visit: Payer: Medicare Other | Admitting: Cardiology

## 2021-05-07 VITALS — BP 116/66 | HR 72 | Ht 67.0 in | Wt 170.0 lb

## 2021-05-07 DIAGNOSIS — I491 Atrial premature depolarization: Secondary | ICD-10-CM | POA: Diagnosis not present

## 2021-05-07 DIAGNOSIS — I4729 Other ventricular tachycardia: Secondary | ICD-10-CM

## 2021-05-07 DIAGNOSIS — I472 Ventricular tachycardia: Secondary | ICD-10-CM

## 2021-05-07 DIAGNOSIS — R7303 Prediabetes: Secondary | ICD-10-CM

## 2021-05-07 DIAGNOSIS — D471 Chronic myeloproliferative disease: Secondary | ICD-10-CM | POA: Diagnosis not present

## 2021-05-07 NOTE — Patient Instructions (Signed)

## 2021-05-07 NOTE — Progress Notes (Signed)
Cardiology Office Note:    Date:  05/07/2021   ID:  [name], DOB [birth year], MRN [id number]  PCP:  Fernando Shanks, MD  Cardiologist:  Fernando Campus, MD    Referring MD: Fernando Shanks, MD   Chief Complaint  Patient presents with   Follow-up  I am doing fine  History of Present Illness:    Fernando Lee is a 75 y.o. male he does have past medical history significant for palpitations, dyslipidemia, thrombocytosis, who was referred to [place] because of ventricular ectopy he stopped drinking coffee and that sensation went away I did echocardiogram which showed preserved left ventricle ejection fraction as well as I put monitor on him.  Surprisingly monitor showed around of nonsustained ventricular tachycardia only 1 episode nonsustained and asymptomatic.  He comes today to my office to talk about this.  Overall he denies having dizziness or palpitations since she stopped drinking coffee there is [place] no problem.  He recently started hydroxyurea which make him feel bad he said he does have sensation of lack of air but he is not short of breath.  He is scheduled to see his oncologist within the next 2 weeks and that he will talk about this to them.  Past Medical History:  Diagnosis Date   Arthritis of right knee    Cervicalgia    Chest discomfort    normal stress echo   Chest pain    Colon polyps    Dyslipidemia    Fluttering sensation of heart    GERD without esophagitis    Hematochezia    Hyperglycemia    Hyperlipidemia    Internal hemorrhoids    Kidney stones    Male erectile dysfunction, unspecified    Mixed dyslipidemia    Mixed hyperlipidemia    Ocular migraine    PAC (premature atrial contraction)    Personal history of colonic polyps    Prediabetes    Residual hemorrhoidal skin tags    Unspecified hemorrhoids     Past Surgical History:  Procedure Laterality Date   BONE MARROW BIOPSY     KIDNEY STONE SURGERY     retrieval    Current Medications: Current  Meds  Medication Sig   aspirin EC 81 MG tablet Take 81 mg by mouth daily. Swallow whole.   Cholecalciferol 50 MCG (2000 UT) TABS Take 2,000 Units by mouth daily.   docusate sodium (COLACE) 100 MG capsule Take 100 mg by mouth 2 (two) times daily.   FIBER PO Take 1 capsule by mouth daily. Unknown strength   fish oil-omega-3 fatty acids 1000 MG capsule Take 1 g by mouth daily. Strength 311m/1500mg   GLUCOSAMINE CHONDROITIN MSM PO Take 1 tablet by mouth daily. Strength 1500/1500   GLUCOSAMINE-VITAMIN D PO Take 1 tablet by mouth daily. Strength 1500/1200   hydroxyurea (HYDREA) 500 MG capsule Take 1 capsule by mouth daily 5 days a week (Monday through Friday). May take with food to minimize GI side effects. (Patient taking differently: 500 mg daily. Take 1 capsule by mouth daily 5 days a week (Monday through Friday). May take with food to minimize GI side effects.)   Misc Natural Products (PROSTATE SUPPORT PO) Take 1 capsule by mouth daily. Unknown strength   Multiple Vitamin (MULTIVITAMIN) tablet Take 1 tablet by mouth daily. Unknown strength   Probiotic Product (PROBIOTIC PO) Take 1 capsule by mouth daily. Gummies Unknown strength   psyllium (METAMUCIL) 58.6 % powder Take 1 packet by mouth daily.  Turmeric (QC TUMERIC COMPLEX PO) Take 750 mg by mouth daily. Unknown strenght   vitamin C (ASCORBIC ACID) 250 MG tablet Take 500 mg by mouth daily.     Allergies:   Patient has no known allergies.   Social History   Socioeconomic History   Marital status: Married    Spouse name: Not on file   Number of children: Not on file   Years of education: Not on file   Highest education level: Not on file  Occupational History   Not on file  Tobacco Use   Smoking status: Former    Types: Cigarettes   Smokeless tobacco: Never  Substance and Sexual Activity   Alcohol use: Yes    Alcohol/week: 1.0 standard drink    Types: 1 Cans of beer per week   Drug use: Not on file   Sexual activity: Not on file   Other Topics Concern   Not on file  Social History Narrative   Not on file   Social Determinants of Health   Financial Resource Strain: Not on file  Food Insecurity: Not on file  Transportation Needs: Not on file  Physical Activity: Not on file  Stress: Not on file  Social Connections: Not on file     Family History: The patient's family history includes Congestive Heart Failure in his brother; Stroke in his brother. ROS:   Please see the history of present illness.    All 14 point review of systems negative except as described per history of present illness  EKGs/Labs/Other Studies Reviewed:      Recent Labs: 04/07/2021: ALT 27; BUN 18; Creatinine 1.13; Hemoglobin 12.2; Platelet Count 673; Potassium 4.7; Sodium 140  Recent Lipid Panel No results found for: CHOL, TRIG, HDL, CHOLHDL, VLDL, LDLCALC, LDLDIRECT  Physical Exam:    VS:  BP 116/66 (BP Location: Left Arm, Patient Position: Sitting)   Pulse 72   Ht _0  (1.702 m)   Wt 170 lb (77.1 kg)   SpO2 94%   BMI 26.63 kg/m     Wt Readings from Last 3 Encounters:  05/07/21 170 lb (77.1 kg)  04/07/21 173 lb 3.2 oz (78.6 kg)  03/17/21 172 lb 4.8 oz (78.2 kg)     GEN:  Well nourished, well developed in no acute distress HEENT: Normal NECK: No JVD; No carotid bruits LYMPHATICS: No lymphadenopathy CARDIAC: RRR, no murmurs, no rubs, no gallops RESPIRATORY:  Clear to auscultation without rales, wheezing or rhonchi  ABDOMEN: Soft, non-tender, non-distended MUSCULOSKELETAL:  No edema; No deformity  SKIN: Warm and dry LOWER EXTREMITIES: no swelling NEUROLOGIC:  Alert and oriented x 3 PSYCHIATRIC:  Normal affect   ASSESSMENT:    1. PAC (premature atrial contraction)   2. Nonsustained ventricular tachycardia ([place])   3. Prediabetes   4. MPN (myeloproliferative neoplasm) ([facility])    PLAN:    In order of problems listed above:  Nonsustained ventricular tachycardia but preserved left ventricle ejection fraction,  completely asymptomatic.  I wanted him to start metoprolol he does not want to do it because he does not want to mix too many medications together at the right now. Dyslipidemia I wanted him to start Lipitor but he did not start it for the same reason like he does not want to start the Toprol.  He does not want to start to modification what he wants.  He does not feel well with hydroxyurea therefore he does not want to do anything about this right now.  This is  a conversation we will continue having. Prediabetes, followed by antimedicine team. Hematological problem he is taking hydroxyurea, he does have some side effect of this medication.  I will see him back in my office in about 3 months at that time we may elect to perform stress test to make sure he does not have inducible ischemia however he had absolutely no symptomatology to suggest that.  He said he can work in the garden in the heat and have absolutely no difficulty doing it.  I understand his reasoning for not starting any new medication right now when he is struggling with hydroxyurea now.   Medication Adjustments/Labs and Tests Ordered: Current medicines are reviewed at length with the patient today.  Concerns regarding medicines are outlined above.  No orders of the defined types were placed in this encounter.  Medication changes: No orders of the defined types were placed in this encounter.   Signed, Park Liter, MD, [facility] 05/07/2021 11:13 AM    [place]

## 2021-05-13 ENCOUNTER — Telehealth: Payer: Self-pay | Admitting: Hematology

## 2021-05-13 DIAGNOSIS — R5383 Other fatigue: Secondary | ICD-10-CM | POA: Diagnosis not present

## 2021-05-13 DIAGNOSIS — R059 Cough, unspecified: Secondary | ICD-10-CM | POA: Diagnosis not present

## 2021-05-13 DIAGNOSIS — R509 Fever, unspecified: Secondary | ICD-10-CM | POA: Diagnosis not present

## 2021-05-13 DIAGNOSIS — U071 COVID-19: Secondary | ICD-10-CM | POA: Diagnosis not present

## 2021-05-13 NOTE — Progress Notes (Signed)
Contacted pt to let him know Dr Irene Limbo wants him to have labs prior to his next appointment instead of just a phone visit. Pt currently is Covid positive,  pt will need to wait and come in after 06/01/21. Pt acknowledged and verbalized understanding.

## 2021-05-13 NOTE — Telephone Encounter (Signed)
R/s appts per 7/27 sch msg. Pt aware.

## 2021-05-20 ENCOUNTER — Other Ambulatory Visit: Payer: Medicare Other

## 2021-05-20 ENCOUNTER — Ambulatory Visit: Payer: Medicare Other | Admitting: Hematology

## 2021-05-29 ENCOUNTER — Telehealth: Payer: Self-pay | Admitting: Hematology

## 2021-05-29 NOTE — Telephone Encounter (Signed)
Left message with rescheduled upcoming appointment due to provider's emergency. 

## 2021-06-02 ENCOUNTER — Inpatient Hospital Stay: Payer: Medicare Other | Admitting: Hematology

## 2021-06-02 ENCOUNTER — Inpatient Hospital Stay: Payer: Medicare Other

## 2021-06-04 ENCOUNTER — Inpatient Hospital Stay: Payer: Medicare Other | Admitting: Hematology

## 2021-06-04 ENCOUNTER — Inpatient Hospital Stay: Payer: Medicare Other | Attending: Hematology

## 2021-06-04 ENCOUNTER — Other Ambulatory Visit: Payer: Self-pay

## 2021-06-04 VITALS — BP 109/59 | HR 63 | Temp 98.1°F | Resp 18 | Ht 67.0 in | Wt 170.6 lb

## 2021-06-04 DIAGNOSIS — D7581 Myelofibrosis: Secondary | ICD-10-CM | POA: Insufficient documentation

## 2021-06-04 DIAGNOSIS — Z8616 Personal history of COVID-19: Secondary | ICD-10-CM | POA: Diagnosis not present

## 2021-06-04 DIAGNOSIS — R079 Chest pain, unspecified: Secondary | ICD-10-CM | POA: Insufficient documentation

## 2021-06-04 DIAGNOSIS — Z79899 Other long term (current) drug therapy: Secondary | ICD-10-CM | POA: Insufficient documentation

## 2021-06-04 DIAGNOSIS — Z1589 Genetic susceptibility to other disease: Secondary | ICD-10-CM

## 2021-06-04 DIAGNOSIS — Z87891 Personal history of nicotine dependence: Secondary | ICD-10-CM | POA: Insufficient documentation

## 2021-06-04 DIAGNOSIS — M1711 Unilateral primary osteoarthritis, right knee: Secondary | ICD-10-CM | POA: Insufficient documentation

## 2021-06-04 DIAGNOSIS — Z86010 Personal history of colonic polyps: Secondary | ICD-10-CM | POA: Insufficient documentation

## 2021-06-04 DIAGNOSIS — E785 Hyperlipidemia, unspecified: Secondary | ICD-10-CM | POA: Diagnosis not present

## 2021-06-04 DIAGNOSIS — K219 Gastro-esophageal reflux disease without esophagitis: Secondary | ICD-10-CM | POA: Diagnosis not present

## 2021-06-04 DIAGNOSIS — R739 Hyperglycemia, unspecified: Secondary | ICD-10-CM | POA: Diagnosis not present

## 2021-06-04 DIAGNOSIS — D471 Chronic myeloproliferative disease: Secondary | ICD-10-CM

## 2021-06-04 LAB — CBC WITH DIFFERENTIAL (CANCER CENTER ONLY)
Abs Immature Granulocytes: 0.35 10*3/uL — ABNORMAL HIGH (ref 0.00–0.07)
Basophils Absolute: 0.6 10*3/uL — ABNORMAL HIGH (ref 0.0–0.1)
Basophils Relative: 4 %
Eosinophils Absolute: 0.9 10*3/uL — ABNORMAL HIGH (ref 0.0–0.5)
Eosinophils Relative: 5 %
HCT: 37.2 % — ABNORMAL LOW (ref 39.0–52.0)
Hemoglobin: 11.9 g/dL — ABNORMAL LOW (ref 13.0–17.0)
Immature Granulocytes: 2 %
Lymphocytes Relative: 7 %
Lymphs Abs: 1.1 10*3/uL (ref 0.7–4.0)
MCH: 32.1 pg (ref 26.0–34.0)
MCHC: 32 g/dL (ref 30.0–36.0)
MCV: 100.3 fL — ABNORMAL HIGH (ref 80.0–100.0)
Monocytes Absolute: 0.9 10*3/uL (ref 0.1–1.0)
Monocytes Relative: 6 %
Neutro Abs: 12.8 10*3/uL — ABNORMAL HIGH (ref 1.7–7.7)
Neutrophils Relative %: 76 %
Platelet Count: 605 10*3/uL — ABNORMAL HIGH (ref 150–400)
RBC: 3.71 MIL/uL — ABNORMAL LOW (ref 4.22–5.81)
RDW: 15.9 % — ABNORMAL HIGH (ref 11.5–15.5)
WBC Count: 16.8 10*3/uL — ABNORMAL HIGH (ref 4.0–10.5)
nRBC: 0 % (ref 0.0–0.2)

## 2021-06-04 LAB — CMP (CANCER CENTER ONLY)
ALT: 26 U/L (ref 0–44)
AST: 22 U/L (ref 15–41)
Albumin: 3.7 g/dL (ref 3.5–5.0)
Alkaline Phosphatase: 59 U/L (ref 38–126)
Anion gap: 9 (ref 5–15)
BUN: 18 mg/dL (ref 8–23)
CO2: 26 mmol/L (ref 22–32)
Calcium: 9.2 mg/dL (ref 8.9–10.3)
Chloride: 106 mmol/L (ref 98–111)
Creatinine: 1.22 mg/dL (ref 0.61–1.24)
GFR, Estimated: >60 mL/min (ref >60)
Glucose, Bld: 126 mg/dL — ABNORMAL HIGH (ref 70–99)
Potassium: 4.5 mmol/L (ref 3.5–5.1)
Sodium: 141 mmol/L (ref 135–145)
Total Bilirubin: 0.6 mg/dL (ref 0.3–1.2)
Total Protein: 6.2 g/dL — ABNORMAL LOW (ref 6.5–8.1)

## 2021-06-04 MED ORDER — HYDROXYUREA 500 MG PO CAPS: 500.0000 mg | ORAL_CAPSULE | Freq: Every day | ORAL | 2 refills | Status: DC

## 2021-06-05 ENCOUNTER — Telehealth: Payer: Self-pay | Admitting: Hematology

## 2021-06-05 MED ORDER — CILGAVIMAB (PART OF EVUSHELD) INJECTION: 300.0000 mg | Freq: Once | INTRAMUSCULAR | Status: DC

## 2021-06-05 MED ORDER — EPINEPHRINE 0.3 MG/0.3ML IJ SOAJ: 0.3000 mg | Freq: Once | INTRAMUSCULAR | Status: DC | PRN

## 2021-06-05 MED ORDER — ALBUTEROL SULFATE HFA 108 (90 BASE) MCG/ACT IN AERS: 2.0000 | INHALATION_SPRAY | Freq: Once | RESPIRATORY_TRACT | Status: DC | PRN

## 2021-06-05 MED ORDER — DIPHENHYDRAMINE HCL 50 MG/ML IJ SOLN: 50.0000 mg | Freq: Once | INTRAMUSCULAR | Status: DC | PRN

## 2021-06-05 MED ORDER — METHYLPREDNISOLONE SODIUM SUCC 125 MG IJ SOLR: 125.0000 mg | Freq: Once | INTRAMUSCULAR | Status: DC | PRN

## 2021-06-05 MED ORDER — TIXAGEVIMAB (PART OF EVUSHELD) INJECTION: 300.0000 mg | Freq: Once | INTRAMUSCULAR | Status: DC

## 2021-06-05 NOTE — Addendum Note (Signed)
Addended by: Neysa Hotter on: 06/05/2021 09:37 AM   Modules accepted: Orders

## 2021-06-05 NOTE — Progress Notes (Signed)
HEMATOLOGY/ONCOLOGY PROGRESS NOTE:   Date of Service: .06/04/2021    Patient Care Team: Vernie Shanks, MD as PCP - General (Family Medicine)  CHIEF COMPLAINTS:  JAK2-positive myeloproliferative neoplasm, primary myelofibrosis.   INTERVAL HISTORY: Fernando Lee is here today for JAK2-positive myeloproliferative neoplasm, primary myelofibrosis vs ET. He was last seen on 04/07/2021. He is accompanied by his wife for this visit.   Mr. Kotarski notes that he has been able to tolerate his hydroxyurea well without any acute toxicities.  He does report that he had moderately severe COVID infection about a month ago and received treatment with Paxlovid.  He notes that he has recovered completely and has no residual symptoms other than minimal fatigue.  No shortness of breath or chest pain.  Was seen by cardiology for PACs. Labs done today reviewed in details with the patient as well as his bone marrow biopsy results and ultrasound abdomen.  MEDICAL HISTORY:  Past Medical History:  Diagnosis Date   Arthritis of right knee    Cervicalgia    Chest discomfort    normal stress echo   Chest pain    Colon polyps    Dyslipidemia    Fluttering sensation of heart    GERD without esophagitis    Hematochezia    Hyperglycemia    Hyperlipidemia    Internal hemorrhoids    Kidney stones    Male erectile dysfunction, unspecified    Mixed dyslipidemia    Mixed hyperlipidemia    Ocular migraine    PAC (premature atrial contraction)    Personal history of colonic polyps    Prediabetes    Residual hemorrhoidal skin tags    Unspecified hemorrhoids     SURGICAL HISTORY: Past Surgical History:  Procedure Laterality Date   BONE MARROW BIOPSY     KIDNEY STONE SURGERY     retrieval    SOCIAL HISTORY: Social History   Socioeconomic History   Marital status: Married    Spouse name: Not on file   Number of children: Not on file   Years of education: Not on file   Highest education level:  Not on file  Occupational History   Not on file  Tobacco Use   Smoking status: Former    Types: Cigarettes   Smokeless tobacco: Never  Substance and Sexual Activity   Alcohol use: Yes    Alcohol/week: 1.0 standard drink    Types: 1 Cans of beer per week   Drug use: Not on file   Sexual activity: Not on file  Other Topics Concern   Not on file  Social History Narrative   Not on file   Social Determinants of Health   Financial Resource Strain: Not on file  Food Insecurity: Not on file  Transportation Needs: Not on file  Physical Activity: Not on file  Stress: Not on file  Social Connections: Not on file  Intimate Partner Violence: Not on file    FAMILY HISTORY: Family History  Problem Relation Age of Onset   Stroke Brother    Congestive Heart Failure Brother     ALLERGIES:  has No Known Allergies.  MEDICATIONS:  Current Outpatient Medications  Medication Sig Dispense Refill   aspirin EC 81 MG tablet Take 81 mg by mouth daily. Swallow whole.     atorvastatin (LIPITOR) 10 MG tablet Take 1 tablet (10 mg total) by mouth daily. 90 tablet 1   Cholecalciferol 50 MCG (2000 UT) TABS Take 2,000 Units by mouth  daily.     docusate sodium (COLACE) 100 MG capsule Take 100 mg by mouth 2 (two) times daily.     FIBER PO Take 1 capsule by mouth daily. Unknown strength     fish oil-omega-3 fatty acids 1000 MG capsule Take 1 g by mouth daily. Strength 312m/1500mg     GLUCOSAMINE CHONDROITIN MSM PO Take 1 tablet by mouth daily. Strength 1500/1500     GLUCOSAMINE-VITAMIN D PO Take 1 tablet by mouth daily. Strength 1500/1200     hydroxyurea (HYDREA) 500 MG capsule Take 1 capsule (500 mg total) by mouth daily. May take with food to minimize GI side effects. 30 capsule 2   Misc Natural Products (PROSTATE SUPPORT PO) Take 1 capsule by mouth daily. Unknown strength     Multiple Vitamin (MULTIVITAMIN) tablet Take 1 tablet by mouth daily. Unknown strength     Probiotic Product (PROBIOTIC PO)  Take 1 capsule by mouth daily. Gummies Unknown strength     psyllium (METAMUCIL) 58.6 % powder Take 1 packet by mouth daily.     Turmeric (QC TUMERIC COMPLEX PO) Take 750 mg by mouth daily. Unknown strenght     vitamin C (ASCORBIC ACID) 250 MG tablet Take 500 mg by mouth daily.     No current facility-administered medications for this visit.    REVIEW OF SYSTEMS:   Review of Systems  Constitutional:  Negative for chills, fever and weight loss.  HENT:  Negative for congestion, nosebleeds and sore throat.   Respiratory:  Negative for cough, shortness of breath and wheezing.   Cardiovascular:  Negative for chest pain, palpitations and leg swelling.  Gastrointestinal:  Negative for abdominal pain, blood in stool, diarrhea, melena, nausea and vomiting.  Genitourinary:  Negative for hematuria.  Skin:  Negative for rash.  Neurological:  Negative for dizziness and headaches.  Endo/Heme/Allergies:  Does not bruise/bleed easily.    PHYSICAL EXAMINATION: ECOG PERFORMANCE STATUS: 0 - Asymptomatic  . Vitals:   06/04/21 0839  BP: (!) 109/59  Pulse: 63  Resp: 18  Temp: 98.1 F (36.7 C)  SpO2: 100%   Filed Weights   06/04/21 0839  Weight: 170 lb 9.6 oz (77.4 kg)   .Body mass index is 26.72 kg/m.  NAD GENERAL:alert, in no acute distress and comfortable SKIN: no acute rashes, no significant lesions EYES: conjunctiva are pink and non-injected, sclera anicteric OROPHARYNX: MMM, no exudates, no oropharyngeal erythema or ulceration NECK: supple, no JVD LYMPH:  no palpable lymphadenopathy in the cervical, axillary or inguinal regions LUNGS: clear to auscultation b/l with normal respiratory effort HEART: regular rate & rhythm ABDOMEN:  normoactive bowel sounds , non tender, not distended. Extremity: no pedal edema PSYCH: alert & oriented x 3 with fluent speech NEURO: no focal motor/sensory deficits   LABORATORY DATA:  I have reviewed the data as listed  . CBC Latest Ref Rng &  Units 06/04/2021 04/07/2021 03/09/2021  WBC 4.0 - 10.5 K/uL 16.8(H) 14.0(H) 19.2(H)  Hemoglobin 13.0 - 17.0 g/dL 11.9(L) 12.2(L) 13.8  Hematocrit 39.0 - 52.0 % 37.2(L) 36.4(L) 41.2  Platelets 150 - 400 K/uL 605(H) 673(H) 827(H)    . CMP Latest Ref Rng & Units 06/04/2021 04/07/2021 02/23/2021  Glucose 70 - 99 mg/dL 126(H) 169(H) 138(H)  BUN 8 - 23 mg/dL '18 18 18  ' Creatinine 0.61 - 1.24 mg/dL 1.22 1.13 1.12  Sodium 135 - 145 mmol/L 141 140 144  Potassium 3.5 - 5.1 mmol/L 4.5 4.7 4.0  Chloride 98 - 111 mmol/L 106 105 103  CO2 22 - 32 mmol/L '26 26 27  ' Calcium 8.9 - 10.3 mg/dL 9.2 9.4 9.6  Total Protein 6.5 - 8.1 g/dL 6.2(L) 6.2(L) 7.3  Total Bilirubin 0.3 - 1.2 mg/dL 0.6 0.6 0.8  Alkaline Phos 38 - 126 U/L 59 59 69  AST 15 - 41 U/L '22 23 25  ' ALT 0 - 44 U/L '26 27 30   ' 02/23/2021 BCR ABL    02/23/2021 JAK2     RADIOGRAPHIC STUDIES: I have personally reviewed the radiological images as listed and agreed with the findings in the report. No results found.   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  No polycythemia.  Mild leukocytosis.   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 Has grade 1 out of 3 reticulin fibrosis.  Uncertain if this is primary or secondary. -On 03/17/2021, started on Hydroxyurea 500 mg with directions to take 1 capsule by mouth daily for 5 days, Monday through Friday. Holding on Saturday and Sunday.  PLAN -His platelets are improved to 605k which are improved from a peak of 827k -Mild anemia with hemoglobin of 11.9 macrocytosis due to hydroxyurea. -We shall increase his hydroxyurea to 500 mg 7 days a week (currently on it 5 days a week) -Recommend to continue to take ASA 81 mg daily -Reviewed abdominal US from 04/03/2021 revealed probable liver and spleen enlargement. -Reviewed bone marrow biopsy results and answered his questions. -We discussed in detail  the natural history of MPN's and the risk of progression of fibrosis, risk of leukemic transformation and need for monitoring of hydroxyurea. -We discussed that given his age and presentation would not push for allogenic bone marrow transplantation at this time. -We are trying to target a platelet count of less than or equal to 400k without causing other significant anemia. -Taking conservative approach to adjusting hydroxyurea dose in the setting of recent COVID infection. Follow-up Labs in 6 weeks Evusheld injection on 6 weeks Phone visit with Dr. Irene Limbo day after labs in 6 weeks   Orders Placed This Encounter  Procedures   CBC with Differential/Platelet    Standing Status:   Future    Standing Expiration Date:   06/05/2022   CMP ([place] only)    Standing Status:   Future    Standing Expiration Date:   06/05/2022   Reticulocytes    Standing Status:   Future    Standing Expiration Date:   06/05/2022   Ferritin    Standing Status:   Future    Standing Expiration Date:   06/05/2022       All of the patients questions were answered with apparent satisfaction. The patient knows to call the clinic with any problems, questions or concerns.  . The total time spent in the appointment was 30 minutes and more than 50% was on counseling and direct patient cares.   Brunetta Genera MD

## 2021-06-05 NOTE — Telephone Encounter (Signed)
Scheduled follow-up appointments per 8/18 los. Patient is aware. 

## 2021-06-10 ENCOUNTER — Other Ambulatory Visit: Payer: Self-pay | Admitting: Pharmacist

## 2021-06-10 DIAGNOSIS — D471 Chronic myeloproliferative disease: Secondary | ICD-10-CM

## 2021-06-15 DIAGNOSIS — D473 Essential (hemorrhagic) thrombocythemia: Secondary | ICD-10-CM | POA: Diagnosis not present

## 2021-07-16 ENCOUNTER — Other Ambulatory Visit: Payer: Self-pay

## 2021-07-16 ENCOUNTER — Inpatient Hospital Stay: Payer: Medicare Other | Attending: Hematology

## 2021-07-16 DIAGNOSIS — Z1589 Genetic susceptibility to other disease: Secondary | ICD-10-CM

## 2021-07-16 DIAGNOSIS — D7581 Myelofibrosis: Secondary | ICD-10-CM | POA: Diagnosis not present

## 2021-07-16 DIAGNOSIS — D649 Anemia, unspecified: Secondary | ICD-10-CM | POA: Diagnosis not present

## 2021-07-16 DIAGNOSIS — D471 Chronic myeloproliferative disease: Secondary | ICD-10-CM

## 2021-07-16 LAB — CBC WITH DIFFERENTIAL/PLATELET
Abs Immature Granulocytes: 0.34 10*3/uL — ABNORMAL HIGH (ref 0.00–0.07)
Basophils Absolute: 0.7 10*3/uL — ABNORMAL HIGH (ref 0.0–0.1)
Basophils Relative: 5 %
Eosinophils Absolute: 1 10*3/uL — ABNORMAL HIGH (ref 0.0–0.5)
Eosinophils Relative: 6 %
HCT: 37.9 % — ABNORMAL LOW (ref 39.0–52.0)
Hemoglobin: 12.7 g/dL — ABNORMAL LOW (ref 13.0–17.0)
Immature Granulocytes: 2 %
Lymphocytes Relative: 6 %
Lymphs Abs: 1 10*3/uL (ref 0.7–4.0)
MCH: 32.8 pg (ref 26.0–34.0)
MCHC: 33.5 g/dL (ref 30.0–36.0)
MCV: 97.9 fL (ref 80.0–100.0)
Monocytes Absolute: 1 10*3/uL (ref 0.1–1.0)
Monocytes Relative: 7 %
Neutro Abs: 11.6 10*3/uL — ABNORMAL HIGH (ref 1.7–7.7)
Neutrophils Relative %: 74 %
Platelets: 554 10*3/uL — ABNORMAL HIGH (ref 150–400)
RBC: 3.87 MIL/uL — ABNORMAL LOW (ref 4.22–5.81)
RDW: 15.2 % (ref 11.5–15.5)
WBC: 15.7 10*3/uL — ABNORMAL HIGH (ref 4.0–10.5)
nRBC: 0 % (ref 0.0–0.2)

## 2021-07-16 LAB — CMP (CANCER CENTER ONLY)
ALT: 20 U/L (ref 0–44)
AST: 20 U/L (ref 15–41)
Albumin: 4.1 g/dL (ref 3.5–5.0)
Alkaline Phosphatase: 56 U/L (ref 38–126)
Anion gap: 13 (ref 5–15)
BUN: 16 mg/dL (ref 8–23)
CO2: 23 mmol/L (ref 22–32)
Calcium: 9.6 mg/dL (ref 8.9–10.3)
Chloride: 105 mmol/L (ref 98–111)
Creatinine: 1.23 mg/dL (ref 0.61–1.24)
GFR, Estimated: >60 mL/min (ref >60)
Glucose, Bld: 101 mg/dL — ABNORMAL HIGH (ref 70–99)
Potassium: 4.5 mmol/L (ref 3.5–5.1)
Sodium: 141 mmol/L (ref 135–145)
Total Bilirubin: 0.7 mg/dL (ref 0.3–1.2)
Total Protein: 6.7 g/dL (ref 6.5–8.1)

## 2021-07-16 LAB — FERRITIN: Ferritin: 13 ng/mL — ABNORMAL LOW (ref 24–336)

## 2021-07-16 LAB — RETICULOCYTES
Immature Retic Fract: 21.6 % — ABNORMAL HIGH (ref 2.3–15.9)
RBC.: 3.93 MIL/uL — ABNORMAL LOW (ref 4.22–5.81)
Retic Count, Absolute: 76.6 10*3/uL (ref 19.0–186.0)
Retic Ct Pct: 2 % (ref 0.4–3.1)

## 2021-07-17 ENCOUNTER — Inpatient Hospital Stay (HOSPITAL_BASED_OUTPATIENT_CLINIC_OR_DEPARTMENT_OTHER): Payer: Medicare Other | Admitting: Hematology

## 2021-07-17 DIAGNOSIS — D471 Chronic myeloproliferative disease: Secondary | ICD-10-CM | POA: Diagnosis not present

## 2021-07-17 DIAGNOSIS — D75839 Thrombocytosis, unspecified: Secondary | ICD-10-CM | POA: Diagnosis not present

## 2021-07-17 MED ORDER — HYDROXYUREA 500 MG PO CAPS: ORAL_CAPSULE | ORAL | 2 refills | Status: DC

## 2021-07-17 MED ORDER — POLYSACCHARIDE IRON COMPLEX 150 MG PO CAPS: 150.0000 mg | ORAL_CAPSULE | Freq: Every day | ORAL | 2 refills | Status: DC

## 2021-07-24 NOTE — Progress Notes (Signed)
HEMATOLOGY/ONCOLOGY PROGRESS NOTE:   Date of Service: .07/17/2021    Patient Care Team: Vernie Shanks, MD as PCP - General (Family Medicine)  CHIEF COMPLAINTS:  JAK2-positive myeloproliferative neoplasm, primary myelofibrosis.   INTERVAL HISTORY:  .I connected with Benedetto Goad on 07/24/21 at 12:40 PM EDT by telephone visit and verified that I am speaking with the correct person using two identifiers.   I discussed the limitations, risks, security and privacy concerns of performing an evaluation and management service by telemedicine and the availability of in-person appointments. I also discussed with the patient that there may be a patient responsible charge related to this service. The patient expressed understanding and agreed to proceed.   Other persons participating in the visit and their role in the encounter: patients wife  Patient's location: home  Provider's location: [place] was called for follow-up of his JAK2-positive myeloproliferative neoplasm, primary myelofibrosis vs ET.   Mr. Hrdlicka notes that he has been able to tolerate his hydroxyurea well without any acute toxicities.  He notes no residual symptoms from his previous COVID infection. Tolerating hydroxyurea. .  Labs done for this clinic were reviewed with him and his wife in details .  MEDICAL HISTORY:  Past Medical History:  Diagnosis Date   Arthritis of right knee    Cervicalgia    Chest discomfort    normal stress echo   Chest pain    Colon polyps    Dyslipidemia    Fluttering sensation of heart    GERD without esophagitis    Hematochezia    Hyperglycemia    Hyperlipidemia    Internal hemorrhoids    Kidney stones    Male erectile dysfunction, unspecified    Mixed dyslipidemia    Mixed hyperlipidemia    Ocular migraine    PAC (premature atrial contraction)    Personal history of colonic polyps    Prediabetes    Residual hemorrhoidal skin tags     Unspecified hemorrhoids     SURGICAL HISTORY: Past Surgical History:  Procedure Laterality Date   BONE MARROW BIOPSY     KIDNEY STONE SURGERY     retrieval    SOCIAL HISTORY: Social History   Socioeconomic History   Marital status: Married    Spouse name: Not on file   Number of children: Not on file   Years of education: Not on file   Highest education level: Not on file  Occupational History   Not on file  Tobacco Use   Smoking status: Former    Types: Cigarettes   Smokeless tobacco: Never  Substance and Sexual Activity   Alcohol use: Yes    Alcohol/week: 1.0 standard drink    Types: 1 Cans of beer per week   Drug use: Not on file   Sexual activity: Not on file  Other Topics Concern   Not on file  Social History Narrative   Not on file   Social Determinants of Health   Financial Resource Strain: Not on file  Food Insecurity: Not on file  Transportation Needs: Not on file  Physical Activity: Not on file  Stress: Not on file  Social Connections: Not on file  Intimate Partner Violence: Not on file    FAMILY HISTORY: Family History  Problem Relation Age of Onset   Stroke Brother    Congestive Heart Failure Brother     ALLERGIES:  has No Known Allergies.  MEDICATIONS:  Current Outpatient Medications  Medication Sig Dispense Refill   iron polysaccharides (NIFEREX) 150 MG capsule Take 1 capsule (150 mg total) by mouth daily. 30 capsule 2   aspirin EC 81 MG tablet Take 81 mg by mouth daily. Swallow whole.     atorvastatin (LIPITOR) 10 MG tablet Take 1 tablet (10 mg total) by mouth daily. 90 tablet 1   Cholecalciferol 50 MCG (2000 UT) TABS Take 2,000 Units by mouth daily.     docusate sodium (COLACE) 100 MG capsule Take 100 mg by mouth 2 (two) times daily.     FIBER PO Take 1 capsule by mouth daily. Unknown strength     fish oil-omega-3 fatty acids 1000 MG capsule Take 1 g by mouth daily. Strength 330m/1500mg     GLUCOSAMINE CHONDROITIN MSM PO Take 1  tablet by mouth daily. Strength 1500/1500     GLUCOSAMINE-VITAMIN D PO Take 1 tablet by mouth daily. Strength 1500/1200     hydroxyurea (HYDREA) 500 MG capsule 10073m(2 tabs) po daily on Saturday and Sunday and 1 tab (50080mpo daily the rest of the 5 days of the week. May take with food to minimize GI side effects. 60 capsule 2   Misc Natural Products (PROSTATE SUPPORT PO) Take 1 capsule by mouth daily. Unknown strength     Multiple Vitamin (MULTIVITAMIN) tablet Take 1 tablet by mouth daily. Unknown strength     Probiotic Product (PROBIOTIC PO) Take 1 capsule by mouth daily. Gummies Unknown strength     psyllium (METAMUCIL) 58.6 % powder Take 1 packet by mouth daily.     Turmeric (QC TUMERIC COMPLEX PO) Take 750 mg by mouth daily. Unknown strenght     vitamin C (ASCORBIC ACID) 250 MG tablet Take 500 mg by mouth daily.     No current facility-administered medications for this visit.    REVIEW OF SYSTEMS:   .10 Point review of Systems was done is negative except as noted above.  PHYSICAL EXAMINATION: ECOG PERFORMANCE STATUS: 0 - Asymptomatic Telemedicine visit  LABORATORY DATA:  I have reviewed the data as listed  . CBC Latest Ref Rng & Units 07/16/2021 06/04/2021 04/07/2021  WBC 4.0 - 10.5 K/uL 15.7(H) 16.8(H) 14.0(H)  Hemoglobin 13.0 - 17.0 g/dL 12.7(L) 11.9(L) 12.2(L)  Hematocrit 39.0 - 52.0 % 37.9(L) 37.2(L) 36.4(L)  Platelets 150 - 400 K/uL 554(H) 605(H) 673(H)    . CMP Latest Ref Rng & Units 07/16/2021 06/04/2021 04/07/2021  Glucose 70 - 99 mg/dL 101(H) 126(H) 169(H)  BUN 8 - 23 mg/dL '16 18 18  ' Creatinine 0.61 - 1.24 mg/dL 1.23 1.22 1.13  Sodium 135 - 145 mmol/L 141 141 140  Potassium 3.5 - 5.1 mmol/L 4.5 4.5 4.7  Chloride 98 - 111 mmol/L 105 106 105  CO2 22 - 32 mmol/L '23 26 26  ' Calcium 8.9 - 10.3 mg/dL 9.6 9.2 9.4  Total Protein 6.5 - 8.1 g/dL 6.7 6.2(L) 6.2(L)  Total Bilirubin 0.3 - 1.2 mg/dL 0.7 0.6 0.6  Alkaline Phos 38 - 126 U/L 56 59 59  AST 15 - 41 U/L '20 22 23   ' ALT 0 - 44 U/L '20 26 27   ' 02/23/2021 BCR ABL    02/23/2021 JAK2     RADIOGRAPHIC STUDIES: I have personally reviewed the radiological images as listed and agreed with the findings in the report. No results found.   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  No polycythemia.  Mild leukocytosis.   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 Has grade 1 out of 3 reticulin fibrosis.  Uncertain if this is primary or secondary. -On 03/17/2021, started on Hydroxyurea 500 mg with directions to take 1 capsule by mouth daily for 5 days, Monday through Friday. Holding on Saturday and Sunday.  PLAN -His platelets are improved to 554k which are improved from a peak of 827k -Mild anemia with hemoglobin of 12.7  macrocytosis due to hydroxyurea. -We shall increase his hydroxyurea to 1000 mg 2 days a week and 500 mg p.o. daily the remaining 5 days of the week. -Recommend to continue to take ASA 81 mg daily  Follow-up Phone visit with Dr. Irene Limbo day after labs in 6 weeks   All of the patients questions were answered with apparent satisfaction. The patient knows to call the clinic with any problems, questions or concerns.  . The total time spent in the appointment was 20 minutes and more than 50% was on counseling and direct patient cares.   Brunetta Genera MD

## 2021-07-27 ENCOUNTER — Telehealth: Payer: Self-pay | Admitting: Hematology

## 2021-07-27 ENCOUNTER — Other Ambulatory Visit: Payer: Self-pay

## 2021-07-27 NOTE — Telephone Encounter (Signed)
Per 10/7 sch msg, pt has been called and confirmed appts

## 2021-08-04 ENCOUNTER — Ambulatory Visit: Payer: Medicare Other | Admitting: Cardiology

## 2021-08-27 ENCOUNTER — Ambulatory Visit: Payer: Medicare Other | Admitting: Cardiology

## 2021-08-27 ENCOUNTER — Other Ambulatory Visit: Payer: Self-pay

## 2021-08-27 ENCOUNTER — Encounter: Payer: Self-pay | Admitting: Cardiology

## 2021-08-27 VITALS — BP 108/66 | HR 62 | Ht 67.0 in | Wt 171.0 lb

## 2021-08-27 DIAGNOSIS — D75839 Thrombocytosis, unspecified: Secondary | ICD-10-CM | POA: Insufficient documentation

## 2021-08-27 DIAGNOSIS — Q825 Congenital non-neoplastic nevus: Secondary | ICD-10-CM | POA: Insufficient documentation

## 2021-08-27 DIAGNOSIS — R002 Palpitations: Secondary | ICD-10-CM

## 2021-08-27 DIAGNOSIS — D126 Benign neoplasm of colon, unspecified: Secondary | ICD-10-CM | POA: Insufficient documentation

## 2021-08-27 DIAGNOSIS — I472 Ventricular tachycardia, unspecified: Secondary | ICD-10-CM | POA: Diagnosis not present

## 2021-08-27 DIAGNOSIS — D471 Chronic myeloproliferative disease: Secondary | ICD-10-CM

## 2021-08-27 DIAGNOSIS — D72829 Elevated white blood cell count, unspecified: Secondary | ICD-10-CM | POA: Insufficient documentation

## 2021-08-27 DIAGNOSIS — K219 Gastro-esophageal reflux disease without esophagitis: Secondary | ICD-10-CM | POA: Diagnosis not present

## 2021-08-27 DIAGNOSIS — R899 Unspecified abnormal finding in specimens from other organs, systems and tissues: Secondary | ICD-10-CM | POA: Insufficient documentation

## 2021-08-27 DIAGNOSIS — I4729 Other ventricular tachycardia: Secondary | ICD-10-CM | POA: Diagnosis not present

## 2021-08-27 DIAGNOSIS — Z125 Encounter for screening for malignant neoplasm of prostate: Secondary | ICD-10-CM | POA: Insufficient documentation

## 2021-08-27 DIAGNOSIS — R7301 Impaired fasting glucose: Secondary | ICD-10-CM | POA: Insufficient documentation

## 2021-08-27 NOTE — Patient Instructions (Signed)
Medication Instructions:  Your physician recommends that you continue on your current medications as directed. Please refer to the Current Medication list given to you today. *If you need a refill on your cardiac medications before your next appointment, please call your pharmacy*   Lab Work: None If you have labs (blood work) drawn today and your tests are completely normal, you will receive your results only by: [place] (if you have MyChart) OR A paper copy in the mail If you have any lab test that is abnormal or we need to change your treatment, we will call you to review the results.   Testing/Procedures: Your physician has requested that you have a stress echocardiogram. For further information please visit [email or web address]. Please follow instruction sheet as given.    Follow-Up: At [facility], you and your health needs are our priority.  As part of our continuing mission to provide you with exceptional heart care, we have created designated Provider Care Teams.  These Care Teams include your primary Cardiologist (physician) and Advanced Practice Providers (APPs -  Physician Assistants and Nurse Practitioners) who all work together to provide you with the care you need, when you need it.  We recommend signing up for the patient portal called "MyChart".  Sign up information is provided on this After Visit Summary.  MyChart is used to connect with patients for Virtual Visits (Telemedicine).  Patients are able to view lab/test results, encounter notes, upcoming appointments, etc.  Non-urgent messages can be sent to your provider as well.   To learn more about what you can do with MyChart, go to [email or web address].    Your next appointment:   6 month(s)  The format for your next appointment:   In Person  Provider:   Jenne Campus, MD    Other Instructions  Exercise Stress Echocardiogram An exercise stress echocardiogram is a test to check how well your  heart is working. This test uses sound waves (ultrasound) and a computer to make images of your heart before and after exercise. Ultrasound images that are taken before you exercise (resting echocardiogram) will show how much blood is getting to your heart muscle and how well your heart muscle and heart valves are functioning. During the next part of this test, you will walk on a treadmill or ride a stationary bicycle to see how exercise affects your heart. While you exercise, the electrical activity of your heart will be monitored with an electrocardiogram (ECG). Your blood pressure will also be monitored. You may have this test if you have: Chest pain or other symptoms of a heart problem. Recently had a heart attack or heart surgery. Heart valve problems. A condition that causes narrowing of the blood vessels that supply your heart (coronary artery disease). A high risk of heart disease and are starting a new exercise program. A high risk of heart disease and need to have major surgery. Heart arrhythmia (abnormal heart rhythm) problems. Heart failure problems. Tell a health care provider about: Any allergies you have. All medicines you are taking, including vitamins, herbs, eye drops, creams, and over-the-counter medicines. Any problems you or family members have had with anesthetic medicines. Any surgeries you have had. Any blood disorders you have. Any medical conditions you have. Whether you are pregnant or may be pregnant. What are the risks? Generally, this is a safe test. However, problems may occur, including: Chest pain. Dizziness or light-headedness. Shortness of breath. Increased or irregular heartbeat (palpitations). Nausea or vomiting. Heart attack. This is  very rare. What happens before the test? Medicines Ask your health care provider about changing or stopping your regular medicines. This is especially important if you are taking diabetes medicines or blood thinners. If  you use an inhaler, bring it with you to the test. General instructions Wear loose, comfortable clothing and walking shoes. Follow instructions from your health care provider about eating or drinking restrictions. You may be asked to avoid all forms of caffeine for 24 hours before your test, or as told by your health care provider. Do not use any products that contain nicotine or tobacco for 4 hours before the test, or as told by your health care provider. These products include cigarettes, chewing tobacco, and vaping devices, such as e-cigarettes. If you need help quitting, ask your health care provider. What happens during the test?  You will take off your clothes from the waist up and put on a hospital gown. Electrodes or electrocardiogram (ECG) patches may be placed on your chest. The electrodes or patches are then connected to a device that monitors your heart rate and rhythm. A blood pressure cuff will be placed on your arm. You will lie down on a table for an ultrasound exam before you exercise. A gel will be applied to your chest to help sound waves pass through your skin. A handheld device, called a transducer, will be pressed against your chest and moved over your heart. The transducer produces sound waves that travel to your heart and bounce back (or "echo" back) to the transducer. These sound waves will be captured in real-time and changed into images of your heart that can be viewed on a video monitor. The images will be recorded on a computer and reviewed by your health care provider. Once the ultrasound is complete, you will start exercising by walking on a treadmill or pedaling a stationary bicycle. Your blood pressure and heart rhythm will be monitored while you exercise. The exercise will gradually get harder or faster. You will exercise until: Your heart reaches a target level. You are too tired to continue. You cannot continue because of chest pain, weakness, or dizziness. You  will have another ultrasound exam immediately after you stop exercising. The procedure may vary among health care providers and hospitals. What can I expect after the test? After your test, it is common to have: Mild soreness. Mild fatigue. Your heart rate and blood pressure will be monitored until they return to your normal levels. You should not have any new symptoms after this test. Follow these instructions at home: After your stress test, you should be able to return to your normal activities and diet. Take over-the-counter and prescription medicines only as told by your health care provider. Keep all follow-up visits. This is important. It is up to you to get the results of your test. Ask your health care provider, or the department that is doing the test, when your results will be ready. Contact a health care provider if you: Feel dizzy or light-headed. Have a fast or irregular heartbeat. Have nausea or vomiting. Have a headache. Feel short of breath. Get help right away if you: Develop pain or pressure: In your chest. In your jaw or neck. Between your shoulder blades. Radiating down your left arm. Faint. Have trouble breathing. These symptoms may represent a serious problem that is an emergency. Do not wait to see if the symptoms will go away. Get medical help right away. Call your local emergency services (911 in the [place]  Do not drive yourself to the hospital. Summary An exercise stress echocardiogram is a test that uses ultrasound to check how well your heart works before and after exercise. Before the test, follow instructions from your health care provider about stopping medicines and avoiding caffeine, nicotine and tobacco, and certain foods and drinks. During the test, your blood pressure and heart rhythm will be monitored while you exercise on a treadmill or stationary bicycle. This information is not intended to replace advice given to you by your health care  provider. Make sure you discuss any questions you have with your health care provider. Document Revised: 06/17/2021 Document Reviewed: 05/27/2020 Elsevier Patient Education  2022 [employer].

## 2021-08-27 NOTE — Progress Notes (Signed)
Cardiology Office Note:    Date:  08/27/2021   ID:  Fernando Lee, DOB 11-23-1945, MRN [id number]  PCP:  Vernie Shanks, MD  Cardiologist:  Jenne Campus, MD    Referring MD: Vernie Shanks, MD   Chief Complaint  Patient presents with   Follow-up  I am doing better  History of Present Illness:    Fernando Lee is a 75 y.o. male with past medical history significant for palpitations, dyslipidemia, thrombocytosis he was referred originally to [place] because of ventricular ectopy he used to drink a lot of coffee however stopped drinking it and sensation went away.  He did have echocardiogram which showed preserved left ventricle ejection fraction.  Surprisingly however monitor shows some runs of nonsustained ventricular tachycardia it was completely asymptomatic.  We tried to stratify to the significance of this finding.  So far echocardiogram showed preserved ejection fraction.  He does not have any dizziness or passing out.  He tells me when he walks uphill he will get short of breath and tired however he was appropriately managed by the oncology team for his thrombocytosis.  He has been given hydroxyurea he is thrombosed site count is close to normal he feels dramatically better he can walk with no major difficulties.  Past Medical History:  Diagnosis Date   Arthritis of right knee    Cervicalgia    Chest discomfort    normal stress echo   Chest pain    Colon polyps    Dyslipidemia    Fluttering sensation of heart    GERD without esophagitis    Hematochezia    Hyperglycemia    Hyperlipidemia    Internal hemorrhoids    Kidney stones    Male erectile dysfunction, unspecified    Mixed dyslipidemia    Mixed hyperlipidemia    Ocular migraine    PAC (premature atrial contraction)    Personal history of colonic polyps    Prediabetes    Residual hemorrhoidal skin tags    Unspecified hemorrhoids     Past Surgical History:  Procedure Laterality Date   BONE MARROW BIOPSY      KIDNEY STONE SURGERY     retrieval    Current Medications: Current Meds  Medication Sig   aspirin EC 81 MG tablet Take 81 mg by mouth daily. Swallow whole.   Cholecalciferol 50 MCG (2000 UT) TABS Take 2,000 Units by mouth daily.   docusate sodium (COLACE) 100 MG capsule Take 100 mg by mouth 2 (two) times daily.   FIBER PO Take 1 capsule by mouth daily. Unknown strength   fish oil-omega-3 fatty acids 1000 MG capsule Take 1 g by mouth 2 (two) times daily. Strength 385m/1500mg   GLUCOSAMINE CHONDROITIN MSM PO Take 1 tablet by mouth daily. Strength 1500/1500   hydroxyurea (HYDREA) 500 MG capsule 10065m(2 tabs) po daily on Saturday and Sunday and 1 tab (50041mpo daily the rest of the 5 days of the week. May take with food to minimize GI side effects. (Patient taking differently: Take 500-1,000 mg by mouth See admin instructions. 1000m94m tabs) po daily on Saturday and Sunday and 1 tab (500mg12m daily the rest of the 5 days of the week. May take with food to minimize GI side effects.)   iron polysaccharides (NIFEREX) 150 MG capsule Take 1 capsule (150 mg total) by mouth daily.   Misc Natural Products (PROSTATE SUPPORT PO) Take 1 capsule by mouth daily. Unknown strength   Multiple Vitamin (  MULTIVITAMIN) tablet Take 1 tablet by mouth daily. Unknown strength   Probiotic Product (PROBIOTIC PO) Take 1 capsule by mouth daily. Gummies Unknown strength   psyllium (METAMUCIL) 58.6 % powder Take 1 packet by mouth daily.   Turmeric (QC TUMERIC COMPLEX PO) Take 750 mg by mouth daily. Unknown strenght   vitamin C (ASCORBIC ACID) 250 MG tablet Take 500 mg by mouth daily.     Allergies:   Patient has no known allergies.   Social History   Socioeconomic History   Marital status: Married    Spouse name: Not on file   Number of children: Not on file   Years of education: Not on file   Highest education level: Not on file  Occupational History   Not on file  Tobacco Use   Smoking status: Former     Types: Cigarettes   Smokeless tobacco: Never  Substance and Sexual Activity   Alcohol use: Yes    Alcohol/week: 1.0 standard drink    Types: 1 Cans of beer per week   Drug use: Not on file   Sexual activity: Not on file  Other Topics Concern   Not on file  Social History Narrative   Not on file   Social Determinants of Health   Financial Resource Strain: Not on file  Food Insecurity: Not on file  Transportation Needs: Not on file  Physical Activity: Not on file  Stress: Not on file  Social Connections: Not on file     Family History: The patient's family history includes Congestive Heart Failure in his brother; Stroke in his brother. ROS:   Please see the history of present illness.    All 14 point review of systems negative except as described per history of present illness  EKGs/Labs/Other Studies Reviewed:      Recent Labs: 07/16/2021: ALT 20; BUN 16; Creatinine 1.23; Hemoglobin 12.7; Platelets 554; Potassium 4.5; Sodium 141  Recent Lipid Panel No results found for: CHOL, TRIG, HDL, CHOLHDL, VLDL, LDLCALC, LDLDIRECT  Physical Exam:    VS:  BP 108/66 (BP Location: Right Arm, Patient Position: Sitting)   Pulse 62   Ht '5\' 7"'  (1.702 m)   Wt 171 lb (77.6 kg)   SpO2 98%   BMI 26.78 kg/m     Wt Readings from Last 3 Encounters:  08/27/21 171 lb (77.6 kg)  06/04/21 170 lb 9.6 oz (77.4 kg)  05/07/21 170 lb (77.1 kg)     GEN:  Well nourished, well developed in no acute distress HEENT: Normal NECK: No JVD; No carotid bruits LYMPHATICS: No lymphadenopathy CARDIAC: RRR, no murmurs, no rubs, no gallops RESPIRATORY:  Clear to auscultation without rales, wheezing or rhonchi  ABDOMEN: Soft, non-tender, non-distended MUSCULOSKELETAL:  No edema; No deformity  SKIN: Warm and dry LOWER EXTREMITIES: no swelling NEUROLOGIC:  Alert and oriented x 3 PSYCHIATRIC:  Normal affect   ASSESSMENT:    1. Ventricular tachycardia   2. Nonsustained ventricular tachycardia   3.  Gastroesophageal reflux disease without esophagitis   4. Palpitations   5. Myeloproliferative disorder ([place])    PLAN:    In order of problems listed above:  Nonsustained ventricular tachycardia echocardiogram preserved left ventricle ejection fraction.  We will make sure he does not have any coronary artery disease.  He does not have any typical symptomatology of it however today he tells me that before when he was walking uphill he was getting short of breath and tired still was able to complete the task but  with some difficulties.  Overall feeling better. Dyslipidemia I did review his K PN which show me his LDL of 95 HDL 30.  We will continue present management. Myeloproliferative disorder that being managed excellently by oncology team. History of gastroesophageal reflux disease.  He is scheduled to have gastroscopy as well as colonoscopy.   Medication Adjustments/Labs and Tests Ordered: Current medicines are reviewed at length with the patient today.  Concerns regarding medicines are outlined above.  Orders Placed This Encounter  Procedures   ECHOCARDIOGRAM STRESS TEST   Medication changes: No orders of the defined types were placed in this encounter.   Signed, Park Liter, MD, [facility] 08/27/2021 10:51 AM    [place]

## 2021-09-14 ENCOUNTER — Other Ambulatory Visit: Payer: Self-pay

## 2021-09-14 ENCOUNTER — Inpatient Hospital Stay: Payer: Medicare Other | Attending: Hematology

## 2021-09-14 DIAGNOSIS — D471 Chronic myeloproliferative disease: Secondary | ICD-10-CM

## 2021-09-14 DIAGNOSIS — Z79899 Other long term (current) drug therapy: Secondary | ICD-10-CM | POA: Diagnosis not present

## 2021-09-14 DIAGNOSIS — D7581 Myelofibrosis: Secondary | ICD-10-CM | POA: Diagnosis not present

## 2021-09-14 DIAGNOSIS — D509 Iron deficiency anemia, unspecified: Secondary | ICD-10-CM | POA: Diagnosis not present

## 2021-09-14 LAB — FERRITIN: Ferritin: 73 ng/mL (ref 24–336)

## 2021-09-14 LAB — CBC WITH DIFFERENTIAL (CANCER CENTER ONLY)
Abs Immature Granulocytes: 0.06 10*3/uL (ref 0.00–0.07)
Basophils Absolute: 0.6 10*3/uL — ABNORMAL HIGH (ref 0.0–0.1)
Basophils Relative: 5 %
Eosinophils Absolute: 0.7 10*3/uL — ABNORMAL HIGH (ref 0.0–0.5)
Eosinophils Relative: 7 %
HCT: 33.2 % — ABNORMAL LOW (ref 39.0–52.0)
Hemoglobin: 11.2 g/dL — ABNORMAL LOW (ref 13.0–17.0)
Immature Granulocytes: 1 %
Lymphocytes Relative: 8 %
Lymphs Abs: 0.8 10*3/uL (ref 0.7–4.0)
MCH: 35.6 pg — ABNORMAL HIGH (ref 26.0–34.0)
MCHC: 33.7 g/dL (ref 30.0–36.0)
MCV: 105.4 fL — ABNORMAL HIGH (ref 80.0–100.0)
Monocytes Absolute: 0.8 10*3/uL (ref 0.1–1.0)
Monocytes Relative: 7 %
Neutro Abs: 7.9 10*3/uL — ABNORMAL HIGH (ref 1.7–7.7)
Neutrophils Relative %: 72 %
Platelet Count: 345 10*3/uL (ref 150–400)
RBC: 3.15 MIL/uL — ABNORMAL LOW (ref 4.22–5.81)
RDW: 19.3 % — ABNORMAL HIGH (ref 11.5–15.5)
WBC Count: 10.8 10*3/uL — ABNORMAL HIGH (ref 4.0–10.5)
nRBC: 0 % (ref 0.0–0.2)

## 2021-09-14 LAB — CMP (CANCER CENTER ONLY)
ALT: 23 U/L (ref 0–44)
AST: 20 U/L (ref 15–41)
Albumin: 3.8 g/dL (ref 3.5–5.0)
Alkaline Phosphatase: 49 U/L (ref 38–126)
Anion gap: 8 (ref 5–15)
BUN: 17 mg/dL (ref 8–23)
CO2: 26 mmol/L (ref 22–32)
Calcium: 8.9 mg/dL (ref 8.9–10.3)
Chloride: 107 mmol/L (ref 98–111)
Creatinine: 1.07 mg/dL (ref 0.61–1.24)
GFR, Estimated: >60 mL/min (ref >60)
Glucose, Bld: 121 mg/dL — ABNORMAL HIGH (ref 70–99)
Potassium: 4.8 mmol/L (ref 3.5–5.1)
Sodium: 141 mmol/L (ref 135–145)
Total Bilirubin: 0.9 mg/dL (ref 0.3–1.2)
Total Protein: 6 g/dL — ABNORMAL LOW (ref 6.5–8.1)

## 2021-09-14 LAB — RETIC PANEL
Immature Retic Fract: 11.6 % (ref 2.3–15.9)
RBC.: 3.16 MIL/uL — ABNORMAL LOW (ref 4.22–5.81)
Retic Count, Absolute: 40.8 10*3/uL (ref 19.0–186.0)
Retic Ct Pct: 1.3 % (ref 0.4–3.1)
Reticulocyte Hemoglobin: 38.4 pg (ref >27.9)

## 2021-09-15 ENCOUNTER — Inpatient Hospital Stay (HOSPITAL_BASED_OUTPATIENT_CLINIC_OR_DEPARTMENT_OTHER): Payer: Medicare Other | Admitting: Hematology

## 2021-09-15 DIAGNOSIS — Z79899 Other long term (current) drug therapy: Secondary | ICD-10-CM | POA: Diagnosis not present

## 2021-09-15 DIAGNOSIS — D471 Chronic myeloproliferative disease: Secondary | ICD-10-CM

## 2021-09-15 DIAGNOSIS — D509 Iron deficiency anemia, unspecified: Secondary | ICD-10-CM | POA: Diagnosis not present

## 2021-09-15 DIAGNOSIS — D7581 Myelofibrosis: Secondary | ICD-10-CM | POA: Diagnosis not present

## 2021-09-16 ENCOUNTER — Telehealth: Payer: Self-pay | Admitting: Hematology

## 2021-09-16 NOTE — Telephone Encounter (Signed)
Scheduled follow-up appointments per 11/29 los. Patient is aware. 

## 2021-09-21 NOTE — Progress Notes (Signed)
HEMATOLOGY/ONCOLOGY PROGRESS NOTE:   Date of Service: .09/15/2021    Patient Care Team: Vernie Shanks, MD as PCP - General (Family Medicine)  CHIEF COMPLAINTS:  JAK2-positive myeloproliferative neoplasm, primary myelofibrosis.   INTERVAL HISTORY:  .I connected with Benedetto Goad on .09/15/2021 at  2:20 PM EST by telephone visit and verified that I am speaking with the correct person using two identifiers.   I discussed the limitations, risks, security and privacy concerns of performing an evaluation and management service by telemedicine and the availability of in-person appointments. I also discussed with the patient that there may be a patient responsible charge related to this service. The patient expressed understanding and agreed to proceed.   Other persons participating in the visit and their role in the encounter: Patients wife   Patient's location: home  Provider's location: [facility]   Chief Complaint: f/u for Jak2 +ve MON  Patient was called to f/u on his Jak2 +ve MPN. He notes no acute new symptoms. Tolerating hydrea well without any significant reported toxicities. No infection issues Labs from 11/28 reviewed.  PLTs wnl @ 345k   MEDICAL HISTORY:  Past Medical History:  Diagnosis Date   Arthritis of right knee    Cervicalgia    Chest discomfort    normal stress echo   Chest pain    Colon polyps    Dyslipidemia    Fluttering sensation of heart    GERD without esophagitis    Hematochezia    Hyperglycemia    Hyperlipidemia    Internal hemorrhoids    Kidney stones    Male erectile dysfunction, unspecified    Mixed dyslipidemia    Mixed hyperlipidemia    Ocular migraine    PAC (premature atrial contraction)    Personal history of colonic polyps    Prediabetes    Residual hemorrhoidal skin tags    Unspecified hemorrhoids     SURGICAL HISTORY: Past Surgical History:  Procedure Laterality Date   BONE MARROW BIOPSY     KIDNEY  STONE SURGERY     retrieval    SOCIAL HISTORY: Social History   Socioeconomic History   Marital status: Married    Spouse name: Not on file   Number of children: Not on file   Years of education: Not on file   Highest education level: Not on file  Occupational History   Not on file  Tobacco Use   Smoking status: Former    Types: Cigarettes   Smokeless tobacco: Never  Substance and Sexual Activity   Alcohol use: Yes    Alcohol/week: 1.0 standard drink    Types: 1 Cans of beer per week   Drug use: Not on file   Sexual activity: Not on file  Other Topics Concern   Not on file  Social History Narrative   Not on file   Social Determinants of Health   Financial Resource Strain: Not on file  Food Insecurity: Not on file  Transportation Needs: Not on file  Physical Activity: Not on file  Stress: Not on file  Social Connections: Not on file  Intimate Partner Violence: Not on file    FAMILY HISTORY: Family History  Problem Relation Age of Onset   Stroke Brother    Congestive Heart Failure Brother     ALLERGIES:  has No Known Allergies.  MEDICATIONS:  Current Outpatient Medications  Medication Sig Dispense Refill   aspirin EC 81 MG tablet Take 81 mg by mouth  daily. Swallow whole.     Cholecalciferol 50 MCG (2000 UT) TABS Take 2,000 Units by mouth daily.     docusate sodium (COLACE) 100 MG capsule Take 100 mg by mouth 2 (two) times daily.     FIBER PO Take 1 capsule by mouth daily. Unknown strength     fish oil-omega-3 fatty acids 1000 MG capsule Take 1 g by mouth 2 (two) times daily. Strength 397m/1500mg     GLUCOSAMINE CHONDROITIN MSM PO Take 1 tablet by mouth daily. Strength 1500/1500     hydroxyurea (HYDREA) 500 MG capsule 10039m(2 tabs) po daily on Saturday and Sunday and 1 tab (5003mpo daily the rest of the 5 days of the week. May take with food to minimize GI side effects. (Patient taking differently: Take 500-1,000 mg by mouth See admin instructions. 1000m44m2 tabs) po daily on Saturday and Sunday and 1 tab (500mg73m daily the rest of the 5 days of the week. May take with food to minimize GI side effects.) 60 capsule 2   iron polysaccharides (NIFEREX) 150 MG capsule Take 1 capsule (150 mg total) by mouth daily. 30 capsule 2   Misc Natural Products (PROSTATE SUPPORT PO) Take 1 capsule by mouth daily. Unknown strength     Multiple Vitamin (MULTIVITAMIN) tablet Take 1 tablet by mouth daily. Unknown strength     Probiotic Product (PROBIOTIC PO) Take 1 capsule by mouth daily. Gummies Unknown strength     psyllium (METAMUCIL) 58.6 % powder Take 1 packet by mouth daily.     Turmeric (QC TUMERIC COMPLEX PO) Take 750 mg by mouth daily. Unknown strenght     vitamin C (ASCORBIC ACID) 250 MG tablet Take 500 mg by mouth daily.     No current facility-administered medications for this visit.    REVIEW OF SYSTEMS:   10 Point review of Systems was done is negative except as noted above.   PHYSICAL EXAMINATION: Telemedicine visit  LABORATORY DATA:  I have reviewed the data as listed  . CBC Latest Ref Rng & Units 09/14/2021 07/16/2021 06/04/2021  WBC 4.0 - 10.5 K/uL 10.8(H) 15.7(H) 16.8(H)  Hemoglobin 13.0 - 17.0 g/dL 11.2(L) 12.7(L) 11.9(L)  Hematocrit 39.0 - 52.0 % 33.2(L) 37.9(L) 37.2(L)  Platelets 150 - 400 K/uL 345 554(H) 605(H)    . CMP Latest Ref Rng & Units 09/14/2021 07/16/2021 06/04/2021  Glucose 70 - 99 mg/dL 121(H) 101(H) 126(H)  BUN 8 - 23 mg/dL '17 16 18  ' Creatinine 0.61 - 1.24 mg/dL 1.07 1.23 1.22  Sodium 135 - 145 mmol/L 141 141 141  Potassium 3.5 - 5.1 mmol/L 4.8 4.5 4.5  Chloride 98 - 111 mmol/L 107 105 106  CO2 22 - 32 mmol/L '26 23 26  ' Calcium 8.9 - 10.3 mg/dL 8.9 9.6 9.2  Total Protein 6.5 - 8.1 g/dL 6.0(L) 6.7 6.2(L)  Total Bilirubin 0.3 - 1.2 mg/dL 0.9 0.7 0.6  Alkaline Phos 38 - 126 U/L 49 56 59  AST 15 - 41 U/L '20 20 22  ' ALT 0 - 44 U/L '23 20 26   ' 02/23/2021 BCR ABL    02/23/2021 JAK2     RADIOGRAPHIC STUDIES: I  have personally reviewed the radiological images as listed and agreed with the findings in the report. No results found.   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  No polycythemia.  Mild leukocytosis.   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 Has grade 1 out  of 3 reticulin fibrosis.  Uncertain if this is primary or secondary. PLAN -patient notes no acute new concerns. -no reported toxicities with current dose of hydrea -His platelets are improved to 345k which is at goal -other labs mild anemia hgb 11.2, wbc count 10.8 -cmp - stable -We shall decrease his hydroxyurea to 1000 mg 1 days a week and 500 mg p.o. daily the remaining 6 days of the week. -Recommend to continue to take ASA 81 mg daily  Follow-up Phone visit with Dr. Irene Limbo day after labs in 8 weeks   All of the patients questions were answered with apparent satisfaction. The patient knows to call the clinic with any problems, questions or concerns.  . The total time spent in the appointment was 20 minutes and more than 50% was on counseling and direct patient cares.    Brunetta Genera MD

## 2021-09-23 ENCOUNTER — Telehealth (HOSPITAL_COMMUNITY): Payer: Self-pay | Admitting: *Deleted

## 2021-09-23 NOTE — Telephone Encounter (Signed)
Patient given detailed instructions per Stress Test Requisition Sheet for test on 09/30/21 at 1445.Patient Notified to arrive 30 minutes early, and that it is imperative to arrive on time for appointment to keep from having the test rescheduled.  Patient verbalized understanding. Damonte Frieson, [place]e [place]

## 2021-09-30 ENCOUNTER — Ambulatory Visit (HOSPITAL_COMMUNITY): Payer: Medicare Other

## 2021-09-30 ENCOUNTER — Other Ambulatory Visit: Payer: Self-pay

## 2021-09-30 ENCOUNTER — Ambulatory Visit (HOSPITAL_COMMUNITY): Payer: Medicare Other | Attending: Cardiology

## 2021-09-30 DIAGNOSIS — I472 Ventricular tachycardia, unspecified: Secondary | ICD-10-CM

## 2021-09-30 DIAGNOSIS — R0602 Shortness of breath: Secondary | ICD-10-CM | POA: Insufficient documentation

## 2021-09-30 MED ORDER — PERFLUTREN LIPID MICROSPHERE
4.0000 mL | INTRAVENOUS | Status: AC | PRN
Start: 2021-09-30 — End: 2021-09-30
  Administered 2021-09-30: 4 mL via INTRAVENOUS

## 2021-10-01 DIAGNOSIS — G43109 Migraine with aura, not intractable, without status migrainosus: Secondary | ICD-10-CM | POA: Diagnosis not present

## 2021-10-01 DIAGNOSIS — I491 Atrial premature depolarization: Secondary | ICD-10-CM | POA: Diagnosis not present

## 2021-10-01 DIAGNOSIS — R739 Hyperglycemia, unspecified: Secondary | ICD-10-CM | POA: Diagnosis not present

## 2021-10-01 DIAGNOSIS — E782 Mixed hyperlipidemia: Secondary | ICD-10-CM | POA: Diagnosis not present

## 2021-10-01 DIAGNOSIS — K219 Gastro-esophageal reflux disease without esophagitis: Secondary | ICD-10-CM | POA: Diagnosis not present

## 2021-10-01 DIAGNOSIS — Z86010 Personal history of colonic polyps: Secondary | ICD-10-CM | POA: Diagnosis not present

## 2021-10-02 ENCOUNTER — Telehealth: Payer: Self-pay

## 2021-10-02 NOTE — Telephone Encounter (Signed)
Patient notified of results.

## 2021-10-02 NOTE — Telephone Encounter (Signed)
-----   Message from Park Liter, MD sent at 10/01/2021 12:00 PM EST ----- Stress test negative for ischemia

## 2021-10-08 DIAGNOSIS — Z86010 Personal history of colonic polyps: Secondary | ICD-10-CM | POA: Diagnosis not present

## 2021-10-08 DIAGNOSIS — K635 Polyp of colon: Secondary | ICD-10-CM | POA: Diagnosis not present

## 2021-10-08 DIAGNOSIS — K5730 Diverticulosis of large intestine without perforation or abscess without bleeding: Secondary | ICD-10-CM | POA: Diagnosis not present

## 2021-10-13 DIAGNOSIS — K635 Polyp of colon: Secondary | ICD-10-CM | POA: Diagnosis not present

## 2021-11-11 ENCOUNTER — Other Ambulatory Visit: Payer: Self-pay

## 2021-11-11 ENCOUNTER — Inpatient Hospital Stay: Payer: Medicare Other | Attending: Hematology

## 2021-11-11 DIAGNOSIS — D649 Anemia, unspecified: Secondary | ICD-10-CM | POA: Insufficient documentation

## 2021-11-11 DIAGNOSIS — Z7982 Long term (current) use of aspirin: Secondary | ICD-10-CM | POA: Diagnosis not present

## 2021-11-11 DIAGNOSIS — D7581 Myelofibrosis: Secondary | ICD-10-CM | POA: Diagnosis not present

## 2021-11-11 DIAGNOSIS — Z79899 Other long term (current) drug therapy: Secondary | ICD-10-CM | POA: Diagnosis not present

## 2021-11-11 DIAGNOSIS — D471 Chronic myeloproliferative disease: Secondary | ICD-10-CM

## 2021-11-11 LAB — CBC WITH DIFFERENTIAL (CANCER CENTER ONLY)
Abs Immature Granulocytes: 0.03 10*3/uL (ref 0.00–0.07)
Basophils Absolute: 0.3 10*3/uL — ABNORMAL HIGH (ref 0.0–0.1)
Basophils Relative: 3 %
Eosinophils Absolute: 0.5 10*3/uL (ref 0.0–0.5)
Eosinophils Relative: 6 %
HCT: 25.3 % — ABNORMAL LOW (ref 39.0–52.0)
Hemoglobin: 8.6 g/dL — ABNORMAL LOW (ref 13.0–17.0)
Immature Granulocytes: 0 %
Lymphocytes Relative: 10 %
Lymphs Abs: 0.9 10*3/uL (ref 0.7–4.0)
MCH: 39.3 pg — ABNORMAL HIGH (ref 26.0–34.0)
MCHC: 34 g/dL (ref 30.0–36.0)
MCV: 115.5 fL — ABNORMAL HIGH (ref 80.0–100.0)
Monocytes Absolute: 0.6 10*3/uL (ref 0.1–1.0)
Monocytes Relative: 7 %
Neutro Abs: 6.3 10*3/uL (ref 1.7–7.7)
Neutrophils Relative %: 74 %
Platelet Count: 381 10*3/uL (ref 150–400)
RBC: 2.19 MIL/uL — ABNORMAL LOW (ref 4.22–5.81)
RDW: 17 % — ABNORMAL HIGH (ref 11.5–15.5)
WBC Count: 8.6 10*3/uL (ref 4.0–10.5)
nRBC: 0 % (ref 0.0–0.2)

## 2021-11-11 LAB — CMP (CANCER CENTER ONLY)
ALT: 20 U/L (ref 0–44)
AST: 15 U/L (ref 15–41)
Albumin: 4.3 g/dL (ref 3.5–5.0)
Alkaline Phosphatase: 57 U/L (ref 38–126)
Anion gap: 7 (ref 5–15)
BUN: 23 mg/dL (ref 8–23)
CO2: 27 mmol/L (ref 22–32)
Calcium: 9 mg/dL (ref 8.9–10.3)
Chloride: 105 mmol/L (ref 98–111)
Creatinine: 1.06 mg/dL (ref 0.61–1.24)
GFR, Estimated: >60 mL/min (ref >60)
Glucose, Bld: 161 mg/dL — ABNORMAL HIGH (ref 70–99)
Potassium: 4.6 mmol/L (ref 3.5–5.1)
Sodium: 139 mmol/L (ref 135–145)
Total Bilirubin: 0.6 mg/dL (ref 0.3–1.2)
Total Protein: 6.5 g/dL (ref 6.5–8.1)

## 2021-11-11 LAB — RETIC PANEL
Immature Retic Fract: 10.2 % (ref 2.3–15.9)
RBC.: 2.18 MIL/uL — ABNORMAL LOW (ref 4.22–5.81)
Retic Count, Absolute: 29.9 10*3/uL (ref 19.0–186.0)
Retic Ct Pct: 1.4 % (ref 0.4–3.1)
Reticulocyte Hemoglobin: 42 pg (ref >27.9)

## 2021-11-11 LAB — FERRITIN: Ferritin: 243 ng/mL (ref 24–336)

## 2021-11-12 ENCOUNTER — Inpatient Hospital Stay (HOSPITAL_BASED_OUTPATIENT_CLINIC_OR_DEPARTMENT_OTHER): Payer: Medicare Other | Admitting: Hematology

## 2021-11-12 DIAGNOSIS — D471 Chronic myeloproliferative disease: Secondary | ICD-10-CM | POA: Diagnosis not present

## 2021-11-12 DIAGNOSIS — Z7982 Long term (current) use of aspirin: Secondary | ICD-10-CM | POA: Diagnosis not present

## 2021-11-12 DIAGNOSIS — D7581 Myelofibrosis: Secondary | ICD-10-CM | POA: Diagnosis not present

## 2021-11-12 DIAGNOSIS — D649 Anemia, unspecified: Secondary | ICD-10-CM | POA: Diagnosis not present

## 2021-11-12 DIAGNOSIS — Z79899 Other long term (current) drug therapy: Secondary | ICD-10-CM | POA: Diagnosis not present

## 2021-11-18 MED ORDER — HYDROXYUREA 500 MG PO CAPS: ORAL_CAPSULE | ORAL | 2 refills | Status: DC

## 2021-11-18 NOTE — Progress Notes (Signed)
HEMATOLOGY/ONCOLOGY PROGRESS NOTE:   Date of Service: .11/12/2021  Patient Care Team: Vernie Shanks, MD as PCP - General (Family Medicine)  CHIEF COMPLAINTS:  Follow-up for continued evaluation and management of JAK2 positive myeloproliferative neoplasm  INTERVAL HISTORY: .I connected with Fernando Lee on .11/12/2021 at  2:00 PM EST by telephone visit and verified that I am speaking with the correct person using two identifiers.   I discussed the limitations, risks, security and privacy concerns of performing an evaluation and management service by telemedicine and the availability of in-person appointments. I also discussed with the patient that there may be a patient responsible charge related to this service. The patient expressed understanding and agreed to proceed.   Other persons participating in the visit and their role in the encounter: Patient's wife   Patients location: Home Providers location: [place]  Chief Complaint: Follow-up to discuss labs and for continued evaluation and management of JAK2 positive myeloproliferative neoplasm.  Patient was called and notes no acute new symptoms.  Some mild fatigue.  No chest pain no shortness of breath no abdominal pain or distention no leg swelling. No lightheadedness or dizziness.  Labs done on 11/11/2021 were discussed in detail.  His platelet counts are within normal limits at 381k, WBC count within normal limits at 8.6k, hemoglobin of 8.6 with an MCV of 115. We discussed cutting down the dose of his hydroxyurea.   MEDICAL HISTORY:  Past Medical History:  Diagnosis Date   Arthritis of right knee    Cervicalgia    Chest discomfort    normal stress echo   Chest pain    Colon polyps    Dyslipidemia    Fluttering sensation of heart    GERD without esophagitis    Hematochezia    Hyperglycemia    Hyperlipidemia    Internal hemorrhoids    Kidney stones    Male erectile dysfunction, unspecified     Mixed dyslipidemia    Mixed hyperlipidemia    Ocular migraine    PAC (premature atrial contraction)    Personal history of colonic polyps    Prediabetes    Residual hemorrhoidal skin tags    Unspecified hemorrhoids     SURGICAL HISTORY: Past Surgical History:  Procedure Laterality Date   BONE MARROW BIOPSY     KIDNEY STONE SURGERY     retrieval    SOCIAL HISTORY: Social History   Socioeconomic History   Marital status: Married    Spouse name: Not on file   Number of children: Not on file   Years of education: Not on file   Highest education level: Not on file  Occupational History   Not on file  Tobacco Use   Smoking status: Former    Types: Cigarettes   Smokeless tobacco: Never  Substance and Sexual Activity   Alcohol use: Yes    Alcohol/week: 1.0 standard drink    Types: 1 Cans of beer per week   Drug use: Not on file   Sexual activity: Not on file  Other Topics Concern   Not on file  Social History Narrative   Not on file   Social Determinants of Health   Financial Resource Strain: Not on file  Food Insecurity: Not on file  Transportation Needs: Not on file  Physical Activity: Not on file  Stress: Not on file  Social Connections: Not on file  Intimate Partner Violence: Not on file    FAMILY HISTORY: Family  History  Problem Relation Age of Onset   Stroke Brother    Congestive Heart Failure Brother     ALLERGIES:  has No Known Allergies.  MEDICATIONS:  Current Outpatient Medications  Medication Sig Dispense Refill   aspirin EC 81 MG tablet Take 81 mg by mouth daily. Swallow whole.     Cholecalciferol 50 MCG (2000 UT) TABS Take 2,000 Units by mouth daily.     docusate sodium (COLACE) 100 MG capsule Take 100 mg by mouth 2 (two) times daily.     FIBER PO Take 1 capsule by mouth daily. Unknown strength     fish oil-omega-3 fatty acids 1000 MG capsule Take 1 g by mouth 2 (two) times daily. Strength 354m/1500mg     GLUCOSAMINE CHONDROITIN MSM PO  Take 1 tablet by mouth daily. Strength 1500/1500     hydroxyurea (HYDREA) 500 MG capsule 10075m(2 tabs) po daily on Saturday and Sunday and 1 tab (50048mpo daily the rest of the 5 days of the week. May take with food to minimize GI side effects. (Patient taking differently: Take 500-1,000 mg by mouth See admin instructions. 1000m69m tabs) po daily on Saturday and Sunday and 1 tab (500mg83m daily the rest of the 5 days of the week. May take with food to minimize GI side effects.) 60 capsule 2   iron polysaccharides (NIFEREX) 150 MG capsule Take 1 capsule (150 mg total) by mouth daily. 30 capsule 2   Misc Natural Products (PROSTATE SUPPORT PO) Take 1 capsule by mouth daily. Unknown strength     Multiple Vitamin (MULTIVITAMIN) tablet Take 1 tablet by mouth daily. Unknown strength     Probiotic Product (PROBIOTIC PO) Take 1 capsule by mouth daily. Gummies Unknown strength     psyllium (METAMUCIL) 58.6 % powder Take 1 packet by mouth daily.     Turmeric (QC TUMERIC COMPLEX PO) Take 750 mg by mouth daily. Unknown strenght     vitamin C (ASCORBIC ACID) 250 MG tablet Take 500 mg by mouth daily.     No current facility-administered medications for this visit.    REVIEW OF SYSTEMS:   Review of systems negative other than that mentioned above   PHYSICAL EXAMINATION: Telemedicine visit  LABORATORY DATA:  I have reviewed the data as listed  . CBC Latest Ref Rng & Units 11/11/2021 09/14/2021 07/16/2021  WBC 4.0 - 10.5 K/uL 8.6 10.8(H) 15.7(H)  Hemoglobin 13.0 - 17.0 g/dL 8.6(L) 11.2(L) 12.7(L)  Hematocrit 39.0 - 52.0 % 25.3(L) 33.2(L) 37.9(L)  Platelets 150 - 400 K/uL 381 345 554(H)    . CMP Latest Ref Rng & Units 11/11/2021 09/14/2021 07/16/2021  Glucose 70 - 99 mg/dL 161(H) 121(H) 101(H)  BUN 8 - 23 mg/dL _0 Creatinine 0.61 - 1.24 mg/dL 1.06 1.07 1.23  Sodium 135 - 145 mmol/L 139 141 141  Potassium 3.5 - 5.1 mmol/L 4.6 4.8 4.5  Chloride 98 - 111 mmol/L 105 107 105  CO2 22 - 32  mmol/L _1 Calcium 8.9 - 10.3 mg/dL 9.0 8.9 9.6  Total Protein 6.5 - 8.1 g/dL 6.5 6.0(L) 6.7  Total Bilirubin 0.3 - 1.2 mg/dL 0.6 0.9 0.7  Alkaline Phos 38 - 126 U/L 57 49 56  AST 15 - 41 U/L _2 ALT 0 - 44 U/L _3 . Lab Results  Component Value Date   LDH 158 02/23/2021    02/23/2021 BCR ABL    02/23/2021 JAK2  RADIOGRAPHIC STUDIES: I have personally reviewed the radiological images as listed and agreed with the findings in the report. No results found.   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  No polycythemia.  Mild leukocytosis.   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 Has grade 1 out of 3 reticulin fibrosis.  Uncertain if this is primary or secondary. PLAN -Patient notes no acute new symptoms -Mild fatigue no lightheadedness no dizziness no abnormal bleeding no infection issues. -Labs show hemoglobin of 8.6 with an MCV of 115, platelets of 385k and WBC count within normal limits. -Given the patient's anemia we recommended decreasing his hydroxyurea dose to 500 mg 5 days a week Monday through Friday. -Continue vitamin B complex -Continue aspirin 81 mg p.o. daily -Continue iron polysaccharide 1 capsule p.o. daily. --We shall decrease his hydroxyurea to 1000 mg 1 days a week and 500 mg p.o. daily the remaining 6 days of the week. -We might need to allow some permissive thrombocytosis in the context of hydroxyurea related bone marrow suppression with anemia. -Recommend to continue to take ASA 81 mg daily  Follow-up Phone visit with Dr. Irene Limbo day after labs in 6 weeks   All of the patients questions were answered with apparent satisfaction. The patient knows to call the clinic with any problems, questions or concerns.  Brunetta Genera MD

## 2021-12-24 ENCOUNTER — Other Ambulatory Visit: Payer: Self-pay

## 2021-12-24 ENCOUNTER — Inpatient Hospital Stay: Payer: Medicare Other

## 2021-12-24 ENCOUNTER — Other Ambulatory Visit: Payer: Self-pay | Admitting: Hematology

## 2021-12-24 ENCOUNTER — Inpatient Hospital Stay: Payer: Medicare Other | Admitting: Hematology

## 2021-12-24 ENCOUNTER — Inpatient Hospital Stay: Payer: Medicare Other | Attending: Hematology

## 2021-12-24 VITALS — BP 104/56 | HR 68 | Temp 98.1°F | Resp 18 | Wt 176.0 lb

## 2021-12-24 DIAGNOSIS — K219 Gastro-esophageal reflux disease without esophagitis: Secondary | ICD-10-CM | POA: Insufficient documentation

## 2021-12-24 DIAGNOSIS — Z87442 Personal history of urinary calculi: Secondary | ICD-10-CM | POA: Insufficient documentation

## 2021-12-24 DIAGNOSIS — D471 Chronic myeloproliferative disease: Secondary | ICD-10-CM | POA: Diagnosis not present

## 2021-12-24 DIAGNOSIS — E785 Hyperlipidemia, unspecified: Secondary | ICD-10-CM | POA: Insufficient documentation

## 2021-12-24 DIAGNOSIS — R5383 Other fatigue: Secondary | ICD-10-CM | POA: Diagnosis not present

## 2021-12-24 DIAGNOSIS — Z8616 Personal history of COVID-19: Secondary | ICD-10-CM | POA: Diagnosis not present

## 2021-12-24 DIAGNOSIS — Z87891 Personal history of nicotine dependence: Secondary | ICD-10-CM | POA: Insufficient documentation

## 2021-12-24 DIAGNOSIS — D6489 Other specified anemias: Secondary | ICD-10-CM | POA: Diagnosis not present

## 2021-12-24 DIAGNOSIS — D649 Anemia, unspecified: Secondary | ICD-10-CM

## 2021-12-24 DIAGNOSIS — Z7982 Long term (current) use of aspirin: Secondary | ICD-10-CM | POA: Insufficient documentation

## 2021-12-24 DIAGNOSIS — D7581 Myelofibrosis: Secondary | ICD-10-CM | POA: Diagnosis not present

## 2021-12-24 DIAGNOSIS — Z79899 Other long term (current) drug therapy: Secondary | ICD-10-CM | POA: Diagnosis not present

## 2021-12-24 LAB — PREPARE RBC (CROSSMATCH)

## 2021-12-24 LAB — IRON AND IRON BINDING CAPACITY (CC-WL,HP ONLY)
Iron: 174 ug/dL (ref 45–182)
Saturation Ratios: 61 % — ABNORMAL HIGH (ref 17.9–39.5)
TIBC: 284 ug/dL (ref 250–450)
UIBC: 110 ug/dL — ABNORMAL LOW (ref 117–376)

## 2021-12-24 LAB — CMP (CANCER CENTER ONLY)
ALT: 14 U/L (ref 0–44)
AST: 13 U/L — ABNORMAL LOW (ref 15–41)
Albumin: 4.1 g/dL (ref 3.5–5.0)
Alkaline Phosphatase: 50 U/L (ref 38–126)
Anion gap: 6 (ref 5–15)
BUN: 21 mg/dL (ref 8–23)
CO2: 26 mmol/L (ref 22–32)
Calcium: 9.4 mg/dL (ref 8.9–10.3)
Chloride: 106 mmol/L (ref 98–111)
Creatinine: 1.03 mg/dL (ref 0.61–1.24)
GFR, Estimated: >60 mL/min (ref >60)
Glucose, Bld: 129 mg/dL — ABNORMAL HIGH (ref 70–99)
Potassium: 4.5 mmol/L (ref 3.5–5.1)
Sodium: 138 mmol/L (ref 135–145)
Total Bilirubin: 0.6 mg/dL (ref 0.3–1.2)
Total Protein: 6.2 g/dL — ABNORMAL LOW (ref 6.5–8.1)

## 2021-12-24 LAB — RETICULOCYTES
Immature Retic Fract: 20.6 % — ABNORMAL HIGH (ref 2.3–15.9)
RBC.: 1.64 MIL/uL — ABNORMAL LOW (ref 4.22–5.81)
Retic Count, Absolute: 30.3 10*3/uL (ref 19.0–186.0)
Retic Ct Pct: 1.9 % (ref 0.4–3.1)

## 2021-12-24 LAB — CBC WITH DIFFERENTIAL/PLATELET
Abs Immature Granulocytes: 0.06 10*3/uL (ref 0.00–0.07)
Basophils Absolute: 0.4 10*3/uL — ABNORMAL HIGH (ref 0.0–0.1)
Basophils Relative: 3 %
Eosinophils Absolute: 1.2 10*3/uL — ABNORMAL HIGH (ref 0.0–0.5)
Eosinophils Relative: 11 %
HCT: 20 % — ABNORMAL LOW (ref 39.0–52.0)
Hemoglobin: 6.6 g/dL — CL (ref 13.0–17.0)
Immature Granulocytes: 1 %
Lymphocytes Relative: 8 %
Lymphs Abs: 0.9 10*3/uL (ref 0.7–4.0)
MCH: 39.8 pg — ABNORMAL HIGH (ref 26.0–34.0)
MCHC: 33 g/dL (ref 30.0–36.0)
MCV: 120.5 fL — ABNORMAL HIGH (ref 80.0–100.0)
Monocytes Absolute: 0.7 10*3/uL (ref 0.1–1.0)
Monocytes Relative: 6 %
Neutro Abs: 8 10*3/uL — ABNORMAL HIGH (ref 1.7–7.7)
Neutrophils Relative %: 71 %
Platelets: 473 10*3/uL — ABNORMAL HIGH (ref 150–400)
RBC: 1.66 MIL/uL — ABNORMAL LOW (ref 4.22–5.81)
RDW: 15.9 % — ABNORMAL HIGH (ref 11.5–15.5)
WBC: 11.3 10*3/uL — ABNORMAL HIGH (ref 4.0–10.5)
nRBC: 0 % (ref 0.0–0.2)

## 2021-12-24 LAB — LACTATE DEHYDROGENASE: LDH: 109 U/L (ref 98–192)

## 2021-12-24 LAB — VITAMIN B12: Vitamin B-12: 2373 pg/mL — ABNORMAL HIGH (ref 180–914)

## 2021-12-24 LAB — FERRITIN: Ferritin: 209 ng/mL (ref 24–336)

## 2021-12-24 MED ORDER — ACETAMINOPHEN 325 MG PO TABS
650.0000 mg | ORAL_TABLET | Freq: Once | ORAL | Status: AC
  Administered 2021-12-24: 13:00:00 650 mg via ORAL
  Filled 2021-12-24: qty 2

## 2021-12-24 MED ORDER — HEPARIN SOD (PORK) LOCK FLUSH 100 UNIT/ML IV SOLN: 500.0000 [IU] | Freq: Every day | INTRAVENOUS | Status: DC | PRN

## 2021-12-24 MED ORDER — HEPARIN SOD (PORK) LOCK FLUSH 100 UNIT/ML IV SOLN: 250.0000 [IU] | INTRAVENOUS | Status: DC | PRN

## 2021-12-24 MED ORDER — SODIUM CHLORIDE 0.9% IV SOLUTION
250.0000 mL | Freq: Once | INTRAVENOUS | Status: AC
  Administered 2021-12-24: 13:00:00 250 mL via INTRAVENOUS

## 2021-12-24 MED ORDER — SODIUM CHLORIDE 0.9% FLUSH: 10.0000 mL | INTRAVENOUS | Status: DC | PRN

## 2021-12-24 MED ORDER — SODIUM CHLORIDE 0.9% FLUSH: 3.0000 mL | INTRAVENOUS | Status: DC | PRN

## 2021-12-24 MED ORDER — METHYLPREDNISOLONE SODIUM SUCC 40 MG IJ SOLR
40.0000 mg | Freq: Once | INTRAMUSCULAR | Status: AC
  Administered 2021-12-24: 13:00:00 40 mg via INTRAVENOUS
  Filled 2021-12-24: qty 1

## 2021-12-24 NOTE — Progress Notes (Signed)
Patient did not receive PRBC's due to T&S. MD aware and patient aware of appointment on 3/10 at 8am. He is also aware to leave the blue blood bracelet in place. ?

## 2021-12-25 ENCOUNTER — Telehealth: Payer: Self-pay | Admitting: Hematology

## 2021-12-25 ENCOUNTER — Inpatient Hospital Stay: Payer: Medicare Other

## 2021-12-25 ENCOUNTER — Other Ambulatory Visit: Payer: Self-pay

## 2021-12-25 DIAGNOSIS — K219 Gastro-esophageal reflux disease without esophagitis: Secondary | ICD-10-CM | POA: Diagnosis not present

## 2021-12-25 DIAGNOSIS — Z87891 Personal history of nicotine dependence: Secondary | ICD-10-CM | POA: Diagnosis not present

## 2021-12-25 DIAGNOSIS — D649 Anemia, unspecified: Secondary | ICD-10-CM

## 2021-12-25 DIAGNOSIS — Z7982 Long term (current) use of aspirin: Secondary | ICD-10-CM | POA: Diagnosis not present

## 2021-12-25 DIAGNOSIS — D6489 Other specified anemias: Secondary | ICD-10-CM | POA: Diagnosis not present

## 2021-12-25 DIAGNOSIS — Z79899 Other long term (current) drug therapy: Secondary | ICD-10-CM | POA: Diagnosis not present

## 2021-12-25 DIAGNOSIS — D7581 Myelofibrosis: Secondary | ICD-10-CM | POA: Diagnosis not present

## 2021-12-25 DIAGNOSIS — Z87442 Personal history of urinary calculi: Secondary | ICD-10-CM | POA: Diagnosis not present

## 2021-12-25 DIAGNOSIS — R5383 Other fatigue: Secondary | ICD-10-CM | POA: Diagnosis not present

## 2021-12-25 DIAGNOSIS — E785 Hyperlipidemia, unspecified: Secondary | ICD-10-CM | POA: Diagnosis not present

## 2021-12-25 DIAGNOSIS — Z8616 Personal history of COVID-19: Secondary | ICD-10-CM | POA: Diagnosis not present

## 2021-12-25 LAB — ERYTHROPOIETIN: Erythropoietin: 1090 m[IU]/mL — ABNORMAL HIGH (ref 2.6–18.5)

## 2021-12-25 LAB — FOLATE RBC
Folate, Hemolysate: 567 ng/mL
Folate, RBC: 2864 ng/mL (ref >498)
Hematocrit: 19.8 % — ABNORMAL LOW (ref 37.5–51.0)

## 2021-12-25 LAB — COPPER, SERUM: Copper: 128 ug/dL (ref 69–132)

## 2021-12-25 MED ORDER — METHYLPREDNISOLONE SODIUM SUCC 40 MG IJ SOLR
40.0000 mg | Freq: Once | INTRAMUSCULAR | Status: AC
  Administered 2021-12-25: 40 mg via INTRAVENOUS
  Filled 2021-12-25: qty 1

## 2021-12-25 MED ORDER — ACETAMINOPHEN 325 MG PO TABS
650.0000 mg | ORAL_TABLET | Freq: Once | ORAL | Status: AC
  Administered 2021-12-25: 650 mg via ORAL
  Filled 2021-12-25: qty 2

## 2021-12-25 MED ORDER — SODIUM CHLORIDE 0.9% IV SOLUTION
250.0000 mL | Freq: Once | INTRAVENOUS | Status: AC
  Administered 2021-12-25: 250 mL via INTRAVENOUS

## 2021-12-25 NOTE — Patient Instructions (Signed)
Blood Transfusion, Adult, Care After This sheet gives you information about how to care for yourself after your procedure. Your doctor may also give you more specific instructions. If you have problems or questions, contact your doctor. What can I expect after the procedure? After the procedure, it is common to have: Bruising and soreness at the IV site. A headache. Follow these instructions at home: Insertion site care   Follow instructions from your doctor about how to take care of your insertion site. This is where an IV tube was put into your vein. Make sure you: Wash your hands with soap and water before and after you change your bandage (dressing). If you cannot use soap and water, use hand sanitizer. Change your bandage as told by your doctor. Check your insertion site every day for signs of infection. Check for: Redness, swelling, or pain. Bleeding from the site. Warmth. Pus or a bad smell. General instructions Take over-the-counter and prescription medicines only as told by your doctor. Rest as told by your doctor. Go back to your normal activities as told by your doctor. Keep all follow-up visits as told by your doctor. This is important. Contact a doctor if: You have itching or red, swollen areas of skin (hives). You feel worried or nervous (anxious). You feel weak after doing your normal activities. You have redness, swelling, warmth, or pain around the insertion site. You have blood coming from the insertion site, and the blood does not stop with pressure. You have pus or a bad smell coming from the insertion site. Get help right away if: You have signs of a serious reaction. This may be coming from an allergy or the body's defense system (immune system). Signs include: Trouble breathing or shortness of breath. Swelling of the face or feeling warm (flushed). Fever or chills. Head, chest, or back pain. Dark pee (urine) or blood in the pee. Widespread rash. Fast  heartbeat. Feeling dizzy or light-headed. You may receive your blood transfusion in an outpatient setting. If so, you will be told whom to contact to report any reactions. These symptoms may be an emergency. Do not wait to see if the symptoms will go away. Get medical help right away. Call your local emergency services (911 in the [place] Do not drive yourself to the hospital. Summary Bruising and soreness at the IV site are common. Check your insertion site every day for signs of infection. Rest as told by your doctor. Go back to your normal activities as told by your doctor. Get help right away if you have signs of a serious reaction. This information is not intended to replace advice given to you by your health care provider. Make sure you discuss any questions you have with your health care provider. Document Revised: 01/29/2021 Document Reviewed: 03/29/2019 Elsevier Patient Education  2022 [employer]  

## 2021-12-25 NOTE — Telephone Encounter (Signed)
Left message with follow-up appointments per 3/9 los. ?

## 2021-12-26 LAB — VITAMIN C: Vitamin C: 0.8 mg/dL (ref 0.4–2.0)

## 2021-12-27 LAB — TYPE AND SCREEN
ABO/RH(D): A POS
Antibody Screen: NEGATIVE
Unit division: 0

## 2021-12-27 LAB — BPAM RBC
Blood Product Expiration Date: 202304022359
ISSUE DATE / TIME: 202303100844
Unit Type and Rh: 6200

## 2021-12-31 NOTE — Progress Notes (Signed)
? ? ?HEMATOLOGY/ONCOLOGY PROGRESS NOTE:  ? ?Date of Service: .12/24/2021 ? ?Patient Care Team: ?Vernie Shanks, MD as PCP - General (Family Medicine) ? ?CHIEF COMPLAINTS:  ?Follow-up for management of JAK2 positive myeloproliferative neoplasm ? ?INTERVAL HISTORY: ?Fernando Lee is here for follow-up for continued evaluation and management of JAK2 positive myeloproliferative neoplasm. ?She notes increase in fatigue and dyspnea on exertion. ?No other primary toxicities from taking Fernando current dose of hydroxyurea 500 mg 4 days a week ?No fevers no chills no night sweats. ?No abdominal pain ?No abnormal bleeding or bruising ?Labs done today were reviewed with the patient and Fernando Lee in detail ? ?MEDICAL HISTORY:  ?Past Medical History:  ?Diagnosis Date  ? Arthritis of right knee   ? Cervicalgia   ? Chest discomfort   ? normal stress echo  ? Chest pain   ? Colon polyps   ? Dyslipidemia   ? Fluttering sensation of heart   ? GERD without esophagitis   ? Hematochezia   ? Hyperglycemia   ? Hyperlipidemia   ? Internal hemorrhoids   ? Kidney stones   ? Male erectile dysfunction, unspecified   ? Mixed dyslipidemia   ? Mixed hyperlipidemia   ? Ocular migraine   ? PAC (premature atrial contraction)   ? Personal history of colonic polyps   ? Prediabetes   ? Residual hemorrhoidal skin tags   ? Unspecified hemorrhoids   ? ? ?SURGICAL HISTORY: ?Past Surgical History:  ?Procedure Laterality Date  ? BONE MARROW BIOPSY    ? KIDNEY STONE SURGERY    ? retrieval  ? ? ?SOCIAL HISTORY: ?Social History  ? ?Socioeconomic History  ? Marital status: Married  ?  Spouse name: Not on file  ? Number of children: Not on file  ? Years of education: Not on file  ? Highest education level: Not on file  ?Occupational History  ? Not on file  ?Tobacco Use  ? Smoking status: Former  ?  Types: Cigarettes  ? Smokeless tobacco: Never  ?Substance and Sexual Activity  ? Alcohol use: Yes  ?  Alcohol/week: 1.0 standard drink  ?  Types: 1 Cans of beer per week  ?  Drug use: Not on file  ? Sexual activity: Not on file  ?Other Topics Concern  ? Not on file  ?Social History Narrative  ? Not on file  ? ?Social Determinants of Health  ? ?Financial Resource Strain: Not on file  ?Food Insecurity: Not on file  ?Transportation Needs: Not on file  ?Physical Activity: Not on file  ?Stress: Not on file  ?Social Connections: Not on file  ?Intimate Partner Violence: Not on file  ? ? ?FAMILY HISTORY: ?Family History  ?Problem Relation Age of Onset  ? Stroke Brother   ? Congestive Heart Failure Brother   ? ? ?ALLERGIES:  has No Known Allergies. ? ?MEDICATIONS:  ?Current Outpatient Medications  ?Medication Sig Dispense Refill  ? aspirin EC 81 MG tablet Take 81 mg by mouth daily. Swallow whole.    ? Cholecalciferol 50 MCG (2000 UT) TABS Take 2,000 Units by mouth daily.    ? docusate sodium (COLACE) 100 MG capsule Take 100 mg by mouth 2 (two) times daily.    ? FIBER PO Take 1 capsule by mouth daily. Unknown strength    ? fish oil-omega-3 fatty acids 1000 MG capsule Take 1 g by mouth 2 (two) times daily. Strength 369m/1500mg    ? GLUCOSAMINE CHONDROITIN MSM PO Take 1 tablet by mouth  daily. Strength 1500/1500    ? hydroxyurea (HYDREA) 500 MG capsule 1 tab (521m) po daily 5 days a week Monday through Friday. May take with food to minimize GI side effects. 60 capsule 2  ? iron polysaccharides (NIFEREX) 150 MG capsule Take 1 capsule (150 mg total) by mouth daily. 30 capsule 2  ? Misc Natural Products (PROSTATE SUPPORT PO) Take 1 capsule by mouth daily. Unknown strength    ? Multiple Vitamin (MULTIVITAMIN) tablet Take 1 tablet by mouth daily. Unknown strength    ? psyllium (METAMUCIL) 58.6 % powder Take 1 packet by mouth daily.    ? Turmeric (QC TUMERIC COMPLEX PO) Take 750 mg by mouth daily. Unknown strenght    ? vitamin C (ASCORBIC ACID) 250 MG tablet Take 500 mg by mouth daily.    ? ?No current facility-administered medications for this visit.  ? ? ?REVIEW OF SYSTEMS:   ?10 Point review of  Systems was done is negative except as noted above. ? ?PHYSICAL EXAMINATION: ?.BP (!) 104/56   Pulse 68   Temp 98.1 ?F (36.7 ?C)   Resp 18   Wt 176 lb (79.8 kg)   SpO2 99%   BMI 27.57 kg/m?  ?NAD ?GENERAL:alert, in no acute distress and comfortable ?SKIN: no acute rashes, no significant lesions ?EYES: conjunctiva are pink and non-injected, sclera anicteric ?OROPHARYNX: MMM, no exudates, no oropharyngeal erythema or ulceration ?NECK: supple, no JVD ?LYMPH:  no palpable lymphadenopathy in the cervical, axillary or inguinal regions ?LUNGS: clear to auscultation b/l with normal respiratory effort ?HEART: regular rate & rhythm ?ABDOMEN:  normoactive bowel sounds , non tender, not distended. ?Extremity: no pedal edema ?PSYCH: alert & oriented x 3 with fluent speech ?NEURO: no focal motor/sensory deficits ? ? ?LABORATORY DATA:  ?I have reviewed the data as listed ? ?. ?CBC Latest Ref Rng & Units 12/24/2021 12/24/2021 11/11/2021  ?WBC 4.0 - 10.5 K/uL 11.3(H) - 8.6  ?Hemoglobin 13.0 - 17.0 g/dL 6.6(LL) - 8.6(L)  ?Hematocrit 37.5 - 51.0 % 19.8(L) 20.0(L) 25.3(L)  ?Platelets 150 - 400 K/uL 473(H) - 381  ? ? ?. ?CMP Latest Ref Rng & Units 12/24/2021 11/11/2021 09/14/2021  ?Glucose 70 - 99 mg/dL 129(H) 161(H) 121(H)  ?BUN 8 - 23 mg/dL _0 ?Creatinine 0.61 - 1.24 mg/dL 1.03 1.06 1.07  ?Sodium 135 - 145 mmol/L 138 139 141  ?Potassium 3.5 - 5.1 mmol/L 4.5 4.6 4.8  ?Chloride 98 - 111 mmol/L 106 105 107  ?CO2 22 - 32 mmol/L _1 ?Calcium 8.9 - 10.3 mg/dL 9.4 9.0 8.9  ?Total Protein 6.5 - 8.1 g/dL 6.2(L) 6.5 6.0(L)  ?Total Bilirubin 0.3 - 1.2 mg/dL 0.6 0.6 0.9  ?Alkaline Phos 38 - 126 U/L 50 57 49  ?AST 15 - 41 U/L 13(L) 15 20  ?ALT 0 - 44 U/L _2 ? ?. ?Lab Results  ?Component Value Date  ? LDH 109 12/24/2021  ? ? ?02/23/2021 BCR ABL  ? ? ?02/23/2021 JAK2 ? ? ? ? ?RADIOGRAPHIC STUDIES: ?I have personally reviewed the radiological images as listed and agreed with the findings in the report. ?No results  found. ? ? ?ASSESSMENT & PLAN:  ? ? ?1)JAK2-positive myeloproliferative neoplasm-primarily presenting with thrombocytosis.  No polycythemia.  Mild leukocytosis.   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 ?Has grade 1 out of 3 reticulin fibrosis.  Uncertain if this is primary or secondary. ?PLAN ?-Patient's labs from today were reviewed in detail ?CBC  showed significant symptomatic anemia with a hemoglobin of 6.6, MCV of 120.5, WBC count of 11.3k platelets of 473k. ?-Patient was evaluated and scheduled for 2 units of PRBC transfusion. ?-Hydroxyurea dose was cut down from 500 mg p.o. 4 days a week down to 500 mg twice weekly. ?Continue aspirin 81 mg p.o. daily ?-Patient notes no issues with bleeding ?-Additional labs were ordered today and showed ferritin of 209 with an iron saturation of 99%, folic acid within normal limits, B12 adequate, copper 128, LDH 109 suggesting against hemolysis or significant myelofibrosis. ?Erythropoietin levels at 1090 ?We discussed the patient appears to be hydroxy refractory since it is difficult to control Fernando platelets without dropping Fernando hemoglobin levels at this time with hydroxyurea ?Follow-up ?note: Labs with 1 units of prbc if need in 10 days ?Labs with 1 unit of PRBC and MD visit in 3 weeks  ?The total time spent in the appointment was 30 minutes*. ? ?All of the patient's questions were answered with apparent satisfaction. The patient knows to call the clinic with any problems, questions or concerns. ? ? ?Sullivan Lone MD MS AAHIVMS [facility] ?Hematology/Oncology Physician ?[place] ? ?.*Total Encounter Time as defined by the Centers for Medicare and Medicaid Services includes, in addition to the face-to-face time of a patient visit (documented in the note above) non-face-to-face time: obtaining and reviewing outside history, ordering and reviewing medications, tests or procedures, care coordination (communications with  other health care professionals or caregivers) and documentation in the medical record. ? ? ? ? ?

## 2022-01-01 ENCOUNTER — Other Ambulatory Visit: Payer: Self-pay

## 2022-01-01 DIAGNOSIS — D471 Chronic myeloproliferative disease: Secondary | ICD-10-CM

## 2022-01-04 ENCOUNTER — Inpatient Hospital Stay: Payer: Medicare Other

## 2022-01-04 ENCOUNTER — Other Ambulatory Visit: Payer: Self-pay | Admitting: Hematology

## 2022-01-04 ENCOUNTER — Other Ambulatory Visit: Payer: Self-pay

## 2022-01-04 DIAGNOSIS — D6489 Other specified anemias: Secondary | ICD-10-CM | POA: Diagnosis not present

## 2022-01-04 DIAGNOSIS — Z87442 Personal history of urinary calculi: Secondary | ICD-10-CM | POA: Diagnosis not present

## 2022-01-04 DIAGNOSIS — Z8616 Personal history of COVID-19: Secondary | ICD-10-CM | POA: Diagnosis not present

## 2022-01-04 DIAGNOSIS — R5383 Other fatigue: Secondary | ICD-10-CM | POA: Diagnosis not present

## 2022-01-04 DIAGNOSIS — K219 Gastro-esophageal reflux disease without esophagitis: Secondary | ICD-10-CM | POA: Diagnosis not present

## 2022-01-04 DIAGNOSIS — E785 Hyperlipidemia, unspecified: Secondary | ICD-10-CM | POA: Diagnosis not present

## 2022-01-04 DIAGNOSIS — D7581 Myelofibrosis: Secondary | ICD-10-CM | POA: Diagnosis not present

## 2022-01-04 DIAGNOSIS — Z7982 Long term (current) use of aspirin: Secondary | ICD-10-CM | POA: Diagnosis not present

## 2022-01-04 DIAGNOSIS — D471 Chronic myeloproliferative disease: Secondary | ICD-10-CM

## 2022-01-04 DIAGNOSIS — Z87891 Personal history of nicotine dependence: Secondary | ICD-10-CM | POA: Diagnosis not present

## 2022-01-04 DIAGNOSIS — Z79899 Other long term (current) drug therapy: Secondary | ICD-10-CM | POA: Diagnosis not present

## 2022-01-04 LAB — CBC WITH DIFFERENTIAL (CANCER CENTER ONLY)
Abs Immature Granulocytes: 0.15 10*3/uL — ABNORMAL HIGH (ref 0.00–0.07)
Basophils Absolute: 0.5 10*3/uL — ABNORMAL HIGH (ref 0.0–0.1)
Basophils Relative: 4 %
Eosinophils Absolute: 1.2 10*3/uL — ABNORMAL HIGH (ref 0.0–0.5)
Eosinophils Relative: 10 %
HCT: 23.5 % — ABNORMAL LOW (ref 39.0–52.0)
Hemoglobin: 7.9 g/dL — ABNORMAL LOW (ref 13.0–17.0)
Immature Granulocytes: 1 %
Lymphocytes Relative: 7 %
Lymphs Abs: 0.8 10*3/uL (ref 0.7–4.0)
MCH: 38.5 pg — ABNORMAL HIGH (ref 26.0–34.0)
MCHC: 33.6 g/dL (ref 30.0–36.0)
MCV: 114.6 fL — ABNORMAL HIGH (ref 80.0–100.0)
Monocytes Absolute: 0.7 10*3/uL (ref 0.1–1.0)
Monocytes Relative: 6 %
Neutro Abs: 8.6 10*3/uL — ABNORMAL HIGH (ref 1.7–7.7)
Neutrophils Relative %: 72 %
Platelet Count: 481 10*3/uL — ABNORMAL HIGH (ref 150–400)
RBC: 2.05 MIL/uL — ABNORMAL LOW (ref 4.22–5.81)
RDW: 18.5 % — ABNORMAL HIGH (ref 11.5–15.5)
WBC Count: 12 10*3/uL — ABNORMAL HIGH (ref 4.0–10.5)
nRBC: 0 % (ref 0.0–0.2)

## 2022-01-04 LAB — SAMPLE TO BLOOD BANK

## 2022-01-04 LAB — CMP (CANCER CENTER ONLY)
ALT: 15 U/L (ref 0–44)
AST: 14 U/L — ABNORMAL LOW (ref 15–41)
Albumin: 4.2 g/dL (ref 3.5–5.0)
Alkaline Phosphatase: 50 U/L (ref 38–126)
Anion gap: 7 (ref 5–15)
BUN: 22 mg/dL (ref 8–23)
CO2: 28 mmol/L (ref 22–32)
Calcium: 9.2 mg/dL (ref 8.9–10.3)
Chloride: 104 mmol/L (ref 98–111)
Creatinine: 1.03 mg/dL (ref 0.61–1.24)
GFR, Estimated: >60 mL/min
Glucose, Bld: 167 mg/dL — ABNORMAL HIGH (ref 70–99)
Potassium: 4.1 mmol/L (ref 3.5–5.1)
Sodium: 139 mmol/L (ref 135–145)
Total Bilirubin: 0.6 mg/dL (ref 0.3–1.2)
Total Protein: 6.6 g/dL (ref 6.5–8.1)

## 2022-01-04 LAB — IRON AND IRON BINDING CAPACITY (CC-WL,HP ONLY)
Iron: 185 ug/dL — ABNORMAL HIGH (ref 45–182)
Saturation Ratios: 68 % — ABNORMAL HIGH (ref 17.9–39.5)
TIBC: 273 ug/dL (ref 250–450)
UIBC: 88 ug/dL — ABNORMAL LOW (ref 117–376)

## 2022-01-04 LAB — LACTATE DEHYDROGENASE: LDH: 110 U/L (ref 98–192)

## 2022-01-04 LAB — FERRITIN: Ferritin: 249 ng/mL (ref 24–336)

## 2022-01-04 NOTE — Progress Notes (Signed)
Per Dr. Irene Limbo - patient is not to receive RBC today with hemoglobin of 7.9. Patient made aware of the change in treatment plan. Patient does not note any dizziness, hypotension, or excessive fatigue. Patient discharged in stable condition.  ?

## 2022-01-12 ENCOUNTER — Other Ambulatory Visit: Payer: Self-pay | Admitting: *Deleted

## 2022-01-12 DIAGNOSIS — D471 Chronic myeloproliferative disease: Secondary | ICD-10-CM

## 2022-01-14 ENCOUNTER — Other Ambulatory Visit: Payer: Self-pay

## 2022-01-14 ENCOUNTER — Inpatient Hospital Stay: Payer: Medicare Other

## 2022-01-14 ENCOUNTER — Inpatient Hospital Stay: Payer: Medicare Other | Admitting: Hematology

## 2022-01-14 VITALS — BP 109/57 | HR 76 | Temp 97.9°F | Resp 18 | Wt 175.6 lb

## 2022-01-14 DIAGNOSIS — D649 Anemia, unspecified: Secondary | ICD-10-CM | POA: Diagnosis not present

## 2022-01-14 DIAGNOSIS — R5383 Other fatigue: Secondary | ICD-10-CM | POA: Diagnosis not present

## 2022-01-14 DIAGNOSIS — D471 Chronic myeloproliferative disease: Secondary | ICD-10-CM

## 2022-01-14 DIAGNOSIS — Z79899 Other long term (current) drug therapy: Secondary | ICD-10-CM | POA: Diagnosis not present

## 2022-01-14 DIAGNOSIS — Z7982 Long term (current) use of aspirin: Secondary | ICD-10-CM | POA: Diagnosis not present

## 2022-01-14 DIAGNOSIS — Z87891 Personal history of nicotine dependence: Secondary | ICD-10-CM | POA: Diagnosis not present

## 2022-01-14 DIAGNOSIS — Z87442 Personal history of urinary calculi: Secondary | ICD-10-CM | POA: Diagnosis not present

## 2022-01-14 DIAGNOSIS — Z8616 Personal history of COVID-19: Secondary | ICD-10-CM | POA: Diagnosis not present

## 2022-01-14 DIAGNOSIS — D6489 Other specified anemias: Secondary | ICD-10-CM | POA: Diagnosis not present

## 2022-01-14 DIAGNOSIS — K219 Gastro-esophageal reflux disease without esophagitis: Secondary | ICD-10-CM | POA: Diagnosis not present

## 2022-01-14 DIAGNOSIS — E785 Hyperlipidemia, unspecified: Secondary | ICD-10-CM | POA: Diagnosis not present

## 2022-01-14 DIAGNOSIS — D7581 Myelofibrosis: Secondary | ICD-10-CM | POA: Diagnosis not present

## 2022-01-14 LAB — CBC WITH DIFFERENTIAL (CANCER CENTER ONLY)
Abs Immature Granulocytes: 0.07 10*3/uL (ref 0.00–0.07)
Basophils Absolute: 0.3 10*3/uL — ABNORMAL HIGH (ref 0.0–0.1)
Basophils Relative: 3 %
Eosinophils Absolute: 1.4 10*3/uL — ABNORMAL HIGH (ref 0.0–0.5)
Eosinophils Relative: 12 %
HCT: 20.9 % — ABNORMAL LOW (ref 39.0–52.0)
Hemoglobin: 6.9 g/dL — CL (ref 13.0–17.0)
Immature Granulocytes: 1 %
Lymphocytes Relative: 8 %
Lymphs Abs: 0.9 10*3/uL (ref 0.7–4.0)
MCH: 37.3 pg — ABNORMAL HIGH (ref 26.0–34.0)
MCHC: 33 g/dL (ref 30.0–36.0)
MCV: 113 fL — ABNORMAL HIGH (ref 80.0–100.0)
Monocytes Absolute: 0.8 10*3/uL (ref 0.1–1.0)
Monocytes Relative: 7 %
Neutro Abs: 8.1 10*3/uL — ABNORMAL HIGH (ref 1.7–7.7)
Neutrophils Relative %: 69 %
Platelet Count: 520 10*3/uL — ABNORMAL HIGH (ref 150–400)
RBC: 1.85 MIL/uL — ABNORMAL LOW (ref 4.22–5.81)
RDW: 18.6 % — ABNORMAL HIGH (ref 11.5–15.5)
WBC Count: 11.6 10*3/uL — ABNORMAL HIGH (ref 4.0–10.5)
nRBC: 0 % (ref 0.0–0.2)

## 2022-01-14 LAB — CMP (CANCER CENTER ONLY)
ALT: 15 U/L (ref 0–44)
AST: 12 U/L — ABNORMAL LOW (ref 15–41)
Albumin: 4.1 g/dL (ref 3.5–5.0)
Alkaline Phosphatase: 52 U/L (ref 38–126)
Anion gap: 5 (ref 5–15)
BUN: 21 mg/dL (ref 8–23)
CO2: 28 mmol/L (ref 22–32)
Calcium: 9 mg/dL (ref 8.9–10.3)
Chloride: 105 mmol/L (ref 98–111)
Creatinine: 1.01 mg/dL (ref 0.61–1.24)
GFR, Estimated: >60 mL/min (ref >60)
Glucose, Bld: 151 mg/dL — ABNORMAL HIGH (ref 70–99)
Potassium: 4.8 mmol/L (ref 3.5–5.1)
Sodium: 138 mmol/L (ref 135–145)
Total Bilirubin: 0.5 mg/dL (ref 0.3–1.2)
Total Protein: 6.4 g/dL — ABNORMAL LOW (ref 6.5–8.1)

## 2022-01-14 LAB — SAMPLE TO BLOOD BANK

## 2022-01-14 LAB — PREPARE RBC (CROSSMATCH)

## 2022-01-14 MED ORDER — METHYLPREDNISOLONE SODIUM SUCC 40 MG IJ SOLR
40.0000 mg | Freq: Once | INTRAMUSCULAR | Status: AC
  Administered 2022-01-14: 40 mg via INTRAVENOUS
  Filled 2022-01-14: qty 1

## 2022-01-14 MED ORDER — SODIUM CHLORIDE 0.9% IV SOLUTION
250.0000 mL | Freq: Once | INTRAVENOUS | Status: AC
  Administered 2022-01-14: 250 mL via INTRAVENOUS

## 2022-01-14 MED ORDER — ACETAMINOPHEN 325 MG PO TABS
650.0000 mg | ORAL_TABLET | Freq: Once | ORAL | Status: AC
  Administered 2022-01-14: 650 mg via ORAL
  Filled 2022-01-14: qty 2

## 2022-01-14 NOTE — Progress Notes (Signed)
? ? ?HEMATOLOGY/ONCOLOGY PROGRESS NOTE:  ? ?Date of Service: 01/14/2022 ? ?Patient Care Team: ?Vernie Shanks, MD as PCP - General (Family Medicine) ? ?CHIEF COMPLAINTS:  ?Follow-up for continued evaluation and management of JAK2 positive myeloproliferative neoplasm ? ?INTERVAL HISTORY: ?Fernando Lee is a 76 y.o. male here for follow-up for continued evaluation and management of JAK2 positive myeloproliferative neoplasm. He presents today accompanied by his wife. He reports He is doing well with no new symptoms or concerns. ? ?He reports that he does well in the morning and as the day goes by he progressively feels more grade 1 fatigue. He says that while walking his dog up a gradual inclined plane on a hill that he experiences weakness in his legs. He notes that when bending down and bending back up, he feels fatigued. ? ?He was currently on Hydroxyurea 500 mg 2 days a week, but we discussed stopping due to symptomatic anemia. ? ?He reports no blood in stool. ? ?He reports no new or recent hemorrhoids.  ? ?He reports staying hydrating a maintaining a healthy diet. We discussed eating fruits and vegetables. ? ?He notes that he struggled with symptoms during COVID-19 infection back in July/August of 2022. He was one Paxlovid for treatment. ? ?No fevers no chills no night sweats. ?No abdominal pain ?No abnormal bleeding or bruising ? ?He notes he does not drink alcohol. ? ?Labs done today were reviewed with the patient and his wife in detail ?CBC shows increased platelet count at 520 from 481. ?Hemoglobin is down to 6.9 from a hemoglobin of 7.92 weeks ago. ? ?LDH done 01/04/2022 was 110. ? ?MEDICAL HISTORY:  ?Past Medical History:  ?Diagnosis Date  ? Arthritis of right knee   ? Cervicalgia   ? Chest discomfort   ? normal stress echo  ? Chest pain   ? Colon polyps   ? Dyslipidemia   ? Fluttering sensation of heart   ? GERD without esophagitis   ? Hematochezia   ? Hyperglycemia   ? Hyperlipidemia   ? Internal  hemorrhoids   ? Kidney stones   ? Male erectile dysfunction, unspecified   ? Mixed dyslipidemia   ? Mixed hyperlipidemia   ? Ocular migraine   ? PAC (premature atrial contraction)   ? Personal history of colonic polyps   ? Prediabetes   ? Residual hemorrhoidal skin tags   ? Unspecified hemorrhoids   ? ? ?SURGICAL HISTORY: ?Past Surgical History:  ?Procedure Laterality Date  ? BONE MARROW BIOPSY    ? KIDNEY STONE SURGERY    ? retrieval  ? ? ?SOCIAL HISTORY: ?Social History  ? ?Socioeconomic History  ? Marital status: Married  ?  Spouse name: Not on file  ? Number of children: Not on file  ? Years of education: Not on file  ? Highest education level: Not on file  ?Occupational History  ? Not on file  ?Tobacco Use  ? Smoking status: Former  ?  Types: Cigarettes  ? Smokeless tobacco: Never  ?Substance and Sexual Activity  ? Alcohol use: Yes  ?  Alcohol/week: 1.0 standard drink  ?  Types: 1 Cans of beer per week  ? Drug use: Not on file  ? Sexual activity: Not on file  ?Other Topics Concern  ? Not on file  ?Social History Narrative  ? Not on file  ? ?Social Determinants of Health  ? ?Financial Resource Strain: Not on file  ?Food Insecurity: Not on file  ?Transportation Needs: Not  on file  ?Physical Activity: Not on file  ?Stress: Not on file  ?Social Connections: Not on file  ?Intimate Partner Violence: Not on file  ? ? ?FAMILY HISTORY: ?Family History  ?Problem Relation Age of Onset  ? Stroke Brother   ? Congestive Heart Failure Brother   ? ? ?ALLERGIES:  has No Known Allergies. ? ?MEDICATIONS:  ?Current Outpatient Medications  ?Medication Sig Dispense Refill  ? aspirin EC 81 MG tablet Take 81 mg by mouth daily. Swallow whole.    ? Cholecalciferol 50 MCG (2000 UT) TABS Take 2,000 Units by mouth daily.    ? docusate sodium (COLACE) 100 MG capsule Take 100 mg by mouth 2 (two) times daily.    ? FIBER PO Take 1 capsule by mouth daily. Unknown strength    ? fish oil-omega-3 fatty acids 1000 MG capsule Take 1 g by mouth 2  (two) times daily. Strength 378m/1500mg    ? GLUCOSAMINE CHONDROITIN MSM PO Take 1 tablet by mouth daily. Strength 1500/1500    ? hydroxyurea (HYDREA) 500 MG capsule 1 tab (5065m po daily 5 days a week Monday through Friday. May take with food to minimize GI side effects. 60 capsule 2  ? iron polysaccharides (NIFEREX) 150 MG capsule Take 1 capsule (150 mg total) by mouth daily. 30 capsule 2  ? Misc Natural Products (PROSTATE SUPPORT PO) Take 1 capsule by mouth daily. Unknown strength    ? Multiple Vitamin (MULTIVITAMIN) tablet Take 1 tablet by mouth daily. Unknown strength    ? psyllium (METAMUCIL) 58.6 % powder Take 1 packet by mouth daily.    ? Turmeric (QC TUMERIC COMPLEX PO) Take 750 mg by mouth daily. Unknown strenght    ? vitamin C (ASCORBIC ACID) 250 MG tablet Take 500 mg by mouth daily.    ? ?No current facility-administered medications for this visit.  ? ? ?REVIEW OF SYSTEMS:   ?10 Point review of Systems was done is negative except as noted above. ? ?PHYSICAL EXAMINATION: ?.BP (!) 109/57   Pulse 76   Temp 97.9 ?F (36.6 ?C)   Resp 18   Wt 175 lb 9.6 oz (79.7 kg)   SpO2 100%   BMI 27.50 kg/m?  ?NAD ?GENERAL:alert, in no acute distress and comfortable ?SKIN: no acute rashes, no significant lesions ?EYES: conjunctiva are pink and non-injected, sclera anicteric ?NECK: supple, no JVD ?LYMPH:  no palpable lymphadenopathy in the cervical, axillary or inguinal regions ?LUNGS: clear to auscultation b/l with normal respiratory effort ?HEART: regular rate & rhythm ?ABDOMEN:  normoactive bowel sounds , non tender, not distended. ?Extremity: no pedal edema ?PSYCH: alert & oriented x 3 with fluent speech ?NEURO: no focal motor/sensory deficits ? ?LABORATORY DATA:  ?I have reviewed the data as listed ? ?. ? ?  Latest Ref Rng & Units 01/14/2022  ? 12:24 PM 01/04/2022  ?  9:26 AM 12/24/2021  ? 11:29 AM  ?CBC  ?WBC 4.0 - 10.5 K/uL 11.6   12.0     ?Hemoglobin 13.0 - 17.0 g/dL 6.9   7.9     ?Hematocrit 39.0 - 52.0 % 20.9    23.5   19.8    ?Platelets 150 - 400 K/uL 520   481     ? ? ?. ? ?  Latest Ref Rng & Units 01/14/2022  ? 12:24 PM 01/04/2022  ?  9:26 AM 12/24/2021  ?  9:31 AM  ?CMP  ?Glucose 70 - 99 mg/dL 151   167   129    ?  BUN 8 - 23 mg/dL _0 ?Creatinine 0.61 - 1.24 mg/dL 1.01   1.03   1.03    ?Sodium 135 - 145 mmol/L 138   139   138    ?Potassium 3.5 - 5.1 mmol/L 4.8   4.1   4.5    ?Chloride 98 - 111 mmol/L 105   104   106    ?CO2 22 - 32 mmol/L _1 ?Calcium 8.9 - 10.3 mg/dL 9.0   9.2   9.4    ?Total Protein 6.5 - 8.1 g/dL 6.4   6.6   6.2    ?Total Bilirubin 0.3 - 1.2 mg/dL 0.5   0.6   0.6    ?Alkaline Phos 38 - 126 U/L 52   50   50    ?AST 15 - 41 U/L _2 ?ALT 0 - 44 U/L _3 ? ?. ?Lab Results  ?Component Value Date  ? LDH 110 01/04/2022  ? ? ?02/23/2021 BCR ABL  ? ? ?02/23/2021 JAK2 ? ? ? ? ?RADIOGRAPHIC STUDIES: ?I have personally reviewed the radiological images as listed and agreed with the findings in the report. ?No results found. ? ? ?ASSESSMENT & PLAN:  ? ? ?1)JAK2-positive myeloproliferative neoplasm-primarily presenting with thrombocytosis.  No polycythemia.  Mild leukocytosis.   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 ?Has grade 1 out of 3 reticulin fibrosis.  Uncertain if this is primary or secondary. ? ?PLAN ?-Patient's labs from today were reviewed in detail ?CBC showed significant symptomatic anemia with a hemoglobin of 6.9, MCV of 113, WBC count of 11.6k platelets of 520k. ?-Patient was evaluated and scheduled for 1 unit of PRBC transfusion. ?-We will stop the Hydroxyurea dose 500 mg twice weekly due to symptomatic macrocytic anemia. RBC at 1.85 today.  ?Continue aspirin 81 mg p.o. daily ?-Additional labs from 12/24/2021 were discussed in detail with him and showed ferritin of 209 with an iron saturation of 25%, folic acid within normal limits, B12 adequate, copper 128, LDH 109 suggesting against hemolysis or significant  myelofibrosis. 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 Other etiologies of anemia have been evaluated

## 2022-01-14 NOTE — Patient Instructions (Signed)
Blood Transfusion, Adult, Care After This sheet gives you information about how to care for yourself after your procedure. Your doctor may also give you more specific instructions. If you have problems or questions, contact your doctor. What can I expect after the procedure? After the procedure, it is common to have: Bruising and soreness at the IV site. A headache. Follow these instructions at home: Insertion site care   Follow instructions from your doctor about how to take care of your insertion site. This is where an IV tube was put into your vein. Make sure you: Wash your hands with soap and water before and after you change your bandage (dressing). If you cannot use soap and water, use hand sanitizer. Change your bandage as told by your doctor. Check your insertion site every day for signs of infection. Check for: Redness, swelling, or pain. Bleeding from the site. Warmth. Pus or a bad smell. General instructions Take over-the-counter and prescription medicines only as told by your doctor. Rest as told by your doctor. Go back to your normal activities as told by your doctor. Keep all follow-up visits as told by your doctor. This is important. Contact a doctor if: You have itching or red, swollen areas of skin (hives). You feel worried or nervous (anxious). You feel weak after doing your normal activities. You have redness, swelling, warmth, or pain around the insertion site. You have blood coming from the insertion site, and the blood does not stop with pressure. You have pus or a bad smell coming from the insertion site. Get help right away if: You have signs of a serious reaction. This may be coming from an allergy or the body's defense system (immune system). Signs include: Trouble breathing or shortness of breath. Swelling of the face or feeling warm (flushed). Fever or chills. Head, chest, or back pain. Dark pee (urine) or blood in the pee. Widespread rash. Fast  heartbeat. Feeling dizzy or light-headed. You may receive your blood transfusion in an outpatient setting. If so, you will be told whom to contact to report any reactions. These symptoms may be an emergency. Do not wait to see if the symptoms will go away. Get medical help right away. Call your local emergency services (911 in the [place] Do not drive yourself to the hospital. Summary Bruising and soreness at the IV site are common. Check your insertion site every day for signs of infection. Rest as told by your doctor. Go back to your normal activities as told by your doctor. Get help right away if you have signs of a serious reaction. This information is not intended to replace advice given to you by your health care provider. Make sure you discuss any questions you have with your health care provider. Document Revised: 01/29/2021 Document Reviewed: 03/29/2019 Elsevier Patient Education  2022 [employer]  

## 2022-01-15 LAB — BPAM RBC
Blood Product Expiration Date: 202304212359
ISSUE DATE / TIME: 202303301454
Unit Type and Rh: 6200

## 2022-01-15 LAB — TYPE AND SCREEN
ABO/RH(D): A POS
Antibody Screen: NEGATIVE
Unit division: 0

## 2022-01-19 ENCOUNTER — Telehealth: Payer: Self-pay | Admitting: Hematology

## 2022-01-19 NOTE — Telephone Encounter (Signed)
Scheduled follow-up appointments per 3/30 los. Patient is aware. 

## 2022-01-25 ENCOUNTER — Other Ambulatory Visit: Payer: Self-pay

## 2022-01-25 ENCOUNTER — Inpatient Hospital Stay: Payer: Medicare Other | Attending: Hematology

## 2022-01-25 ENCOUNTER — Other Ambulatory Visit: Payer: Self-pay | Admitting: *Deleted

## 2022-01-25 ENCOUNTER — Inpatient Hospital Stay: Payer: Medicare Other

## 2022-01-25 DIAGNOSIS — E785 Hyperlipidemia, unspecified: Secondary | ICD-10-CM | POA: Insufficient documentation

## 2022-01-25 DIAGNOSIS — D471 Chronic myeloproliferative disease: Secondary | ICD-10-CM

## 2022-01-25 DIAGNOSIS — Z87891 Personal history of nicotine dependence: Secondary | ICD-10-CM | POA: Diagnosis not present

## 2022-01-25 DIAGNOSIS — D649 Anemia, unspecified: Secondary | ICD-10-CM

## 2022-01-25 DIAGNOSIS — Z86010 Personal history of colonic polyps: Secondary | ICD-10-CM | POA: Diagnosis not present

## 2022-01-25 DIAGNOSIS — Z79899 Other long term (current) drug therapy: Secondary | ICD-10-CM | POA: Diagnosis not present

## 2022-01-25 DIAGNOSIS — K219 Gastro-esophageal reflux disease without esophagitis: Secondary | ICD-10-CM | POA: Insufficient documentation

## 2022-01-25 DIAGNOSIS — D7581 Myelofibrosis: Secondary | ICD-10-CM | POA: Insufficient documentation

## 2022-01-25 DIAGNOSIS — M129 Arthropathy, unspecified: Secondary | ICD-10-CM | POA: Diagnosis not present

## 2022-01-25 LAB — CBC WITH DIFFERENTIAL/PLATELET
Abs Immature Granulocytes: 0.18 10*3/uL — ABNORMAL HIGH (ref 0.00–0.07)
Basophils Absolute: 0.4 10*3/uL — ABNORMAL HIGH (ref 0.0–0.1)
Basophils Relative: 3 %
Eosinophils Absolute: 1.3 10*3/uL — ABNORMAL HIGH (ref 0.0–0.5)
Eosinophils Relative: 9 %
HCT: 21.2 % — ABNORMAL LOW (ref 39.0–52.0)
Hemoglobin: 7.2 g/dL — ABNORMAL LOW (ref 13.0–17.0)
Immature Granulocytes: 1 %
Lymphocytes Relative: 6 %
Lymphs Abs: 0.8 10*3/uL (ref 0.7–4.0)
MCH: 36.7 pg — ABNORMAL HIGH (ref 26.0–34.0)
MCHC: 34 g/dL (ref 30.0–36.0)
MCV: 108.2 fL — ABNORMAL HIGH (ref 80.0–100.0)
Monocytes Absolute: 0.9 10*3/uL (ref 0.1–1.0)
Monocytes Relative: 6 %
Neutro Abs: 10.3 10*3/uL — ABNORMAL HIGH (ref 1.7–7.7)
Neutrophils Relative %: 75 %
Platelets: 601 10*3/uL — ABNORMAL HIGH (ref 150–400)
RBC: 1.96 MIL/uL — ABNORMAL LOW (ref 4.22–5.81)
RDW: 18.8 % — ABNORMAL HIGH (ref 11.5–15.5)
WBC: 13.9 10*3/uL — ABNORMAL HIGH (ref 4.0–10.5)
nRBC: 0 % (ref 0.0–0.2)

## 2022-01-25 LAB — SAMPLE TO BLOOD BANK

## 2022-01-25 LAB — RETICULOCYTES
Immature Retic Fract: 10.7 % (ref 2.3–15.9)
RBC.: 1.96 MIL/uL — ABNORMAL LOW (ref 4.22–5.81)
Retic Count, Absolute: 32.5 10*3/uL (ref 19.0–186.0)
Retic Ct Pct: 1.7 % (ref 0.4–3.1)

## 2022-01-25 LAB — CMP (CANCER CENTER ONLY)
ALT: 16 U/L (ref 0–44)
AST: 13 U/L — ABNORMAL LOW (ref 15–41)
Albumin: 4.1 g/dL (ref 3.5–5.0)
Alkaline Phosphatase: 54 U/L (ref 38–126)
Anion gap: 5 (ref 5–15)
BUN: 21 mg/dL (ref 8–23)
CO2: 28 mmol/L (ref 22–32)
Calcium: 9 mg/dL (ref 8.9–10.3)
Chloride: 105 mmol/L (ref 98–111)
Creatinine: 0.99 mg/dL (ref 0.61–1.24)
GFR, Estimated: >60 mL/min (ref >60)
Glucose, Bld: 112 mg/dL — ABNORMAL HIGH (ref 70–99)
Potassium: 4.6 mmol/L (ref 3.5–5.1)
Sodium: 138 mmol/L (ref 135–145)
Total Bilirubin: 0.4 mg/dL (ref 0.3–1.2)
Total Protein: 6.3 g/dL — ABNORMAL LOW (ref 6.5–8.1)

## 2022-01-25 LAB — PREPARE RBC (CROSSMATCH)

## 2022-01-25 MED ORDER — HEPARIN SOD (PORK) LOCK FLUSH 100 UNIT/ML IV SOLN: 250.0000 [IU] | INTRAVENOUS | Status: DC | PRN

## 2022-01-25 MED ORDER — METHYLPREDNISOLONE SODIUM SUCC 40 MG IJ SOLR
40.0000 mg | Freq: Once | INTRAMUSCULAR | Status: AC
  Administered 2022-01-25: 40 mg via INTRAVENOUS
  Filled 2022-01-25: qty 1

## 2022-01-25 MED ORDER — ACETAMINOPHEN 325 MG PO TABS
650.0000 mg | ORAL_TABLET | Freq: Once | ORAL | Status: AC
  Administered 2022-01-25: 650 mg via ORAL
  Filled 2022-01-25: qty 2

## 2022-01-25 MED ORDER — SODIUM CHLORIDE 0.9% IV SOLUTION
250.0000 mL | Freq: Once | INTRAVENOUS | Status: AC
  Administered 2022-01-25: 250 mL via INTRAVENOUS

## 2022-01-25 MED ORDER — SODIUM CHLORIDE 0.9% FLUSH: 3.0000 mL | INTRAVENOUS | Status: DC | PRN

## 2022-01-25 NOTE — Patient Instructions (Signed)

## 2022-01-25 NOTE — Progress Notes (Signed)
Blood orders placed and confirmed with blood bank ?

## 2022-01-26 LAB — TYPE AND SCREEN
ABO/RH(D): A POS
Antibody Screen: NEGATIVE
Unit division: 0

## 2022-01-26 LAB — PARVOVIRUS B19 IGM: Parovirus B19 IgM Abs: 0.1 index (ref 0.0–0.8)

## 2022-01-26 LAB — BPAM RBC
Blood Product Expiration Date: 202304222359
ISSUE DATE / TIME: 202304101504
Unit Type and Rh: 6200

## 2022-01-27 LAB — HUMAN PARVOVIRUS DNA DETECTION BY PCR: Parvovirus B19, PCR: NEGATIVE

## 2022-02-03 ENCOUNTER — Other Ambulatory Visit: Payer: Self-pay

## 2022-02-03 DIAGNOSIS — D471 Chronic myeloproliferative disease: Secondary | ICD-10-CM

## 2022-02-05 ENCOUNTER — Inpatient Hospital Stay: Payer: Medicare Other

## 2022-02-05 ENCOUNTER — Other Ambulatory Visit: Payer: Self-pay

## 2022-02-05 ENCOUNTER — Inpatient Hospital Stay (HOSPITAL_BASED_OUTPATIENT_CLINIC_OR_DEPARTMENT_OTHER): Payer: Medicare Other | Admitting: Hematology

## 2022-02-05 VITALS — BP 104/58 | HR 69 | Temp 97.9°F | Resp 20 | Wt 176.3 lb

## 2022-02-05 DIAGNOSIS — M129 Arthropathy, unspecified: Secondary | ICD-10-CM | POA: Diagnosis not present

## 2022-02-05 DIAGNOSIS — D471 Chronic myeloproliferative disease: Secondary | ICD-10-CM | POA: Diagnosis not present

## 2022-02-05 DIAGNOSIS — Z87891 Personal history of nicotine dependence: Secondary | ICD-10-CM | POA: Diagnosis not present

## 2022-02-05 DIAGNOSIS — Z1589 Genetic susceptibility to other disease: Secondary | ICD-10-CM | POA: Diagnosis not present

## 2022-02-05 DIAGNOSIS — E785 Hyperlipidemia, unspecified: Secondary | ICD-10-CM | POA: Diagnosis not present

## 2022-02-05 DIAGNOSIS — K219 Gastro-esophageal reflux disease without esophagitis: Secondary | ICD-10-CM | POA: Diagnosis not present

## 2022-02-05 DIAGNOSIS — Z86010 Personal history of colonic polyps: Secondary | ICD-10-CM | POA: Diagnosis not present

## 2022-02-05 DIAGNOSIS — Z79899 Other long term (current) drug therapy: Secondary | ICD-10-CM | POA: Diagnosis not present

## 2022-02-05 DIAGNOSIS — D649 Anemia, unspecified: Secondary | ICD-10-CM

## 2022-02-05 DIAGNOSIS — D7581 Myelofibrosis: Secondary | ICD-10-CM | POA: Diagnosis not present

## 2022-02-05 LAB — CMP (CANCER CENTER ONLY)
ALT: 17 U/L (ref 0–44)
AST: 13 U/L — ABNORMAL LOW (ref 15–41)
Albumin: 4 g/dL (ref 3.5–5.0)
Alkaline Phosphatase: 53 U/L (ref 38–126)
Anion gap: 6 (ref 5–15)
BUN: 23 mg/dL (ref 8–23)
CO2: 28 mmol/L (ref 22–32)
Calcium: 8.7 mg/dL — ABNORMAL LOW (ref 8.9–10.3)
Chloride: 105 mmol/L (ref 98–111)
Creatinine: 1.07 mg/dL (ref 0.61–1.24)
GFR, Estimated: >60 mL/min (ref >60)
Glucose, Bld: 155 mg/dL — ABNORMAL HIGH (ref 70–99)
Potassium: 4.7 mmol/L (ref 3.5–5.1)
Sodium: 139 mmol/L (ref 135–145)
Total Bilirubin: 0.4 mg/dL (ref 0.3–1.2)
Total Protein: 6.1 g/dL — ABNORMAL LOW (ref 6.5–8.1)

## 2022-02-05 LAB — CBC WITH DIFFERENTIAL (CANCER CENTER ONLY)
Abs Immature Granulocytes: 0.18 10*3/uL — ABNORMAL HIGH (ref 0.00–0.07)
Basophils Absolute: 0.4 10*3/uL — ABNORMAL HIGH (ref 0.0–0.1)
Basophils Relative: 3 %
Eosinophils Absolute: 1.2 10*3/uL — ABNORMAL HIGH (ref 0.0–0.5)
Eosinophils Relative: 9 %
HCT: 23 % — ABNORMAL LOW (ref 39.0–52.0)
Hemoglobin: 7.7 g/dL — ABNORMAL LOW (ref 13.0–17.0)
Immature Granulocytes: 1 %
Lymphocytes Relative: 8 %
Lymphs Abs: 0.9 10*3/uL (ref 0.7–4.0)
MCH: 35.2 pg — ABNORMAL HIGH (ref 26.0–34.0)
MCHC: 33.5 g/dL (ref 30.0–36.0)
MCV: 105 fL — ABNORMAL HIGH (ref 80.0–100.0)
Monocytes Absolute: 1 10*3/uL (ref 0.1–1.0)
Monocytes Relative: 8 %
Neutro Abs: 8.9 10*3/uL — ABNORMAL HIGH (ref 1.7–7.7)
Neutrophils Relative %: 71 %
Platelet Count: 584 10*3/uL — ABNORMAL HIGH (ref 150–400)
RBC: 2.19 MIL/uL — ABNORMAL LOW (ref 4.22–5.81)
RDW: 18.6 % — ABNORMAL HIGH (ref 11.5–15.5)
WBC Count: 12.5 10*3/uL — ABNORMAL HIGH (ref 4.0–10.5)
nRBC: 0 % (ref 0.0–0.2)

## 2022-02-05 LAB — SAMPLE TO BLOOD BANK

## 2022-02-07 NOTE — Progress Notes (Signed)
? ? ?HEMATOLOGY/ONCOLOGY PROGRESS NOTE:  ? ?Date of Service: 02/05/2022 ? ? ?Patient Care Team: ?Vernie Shanks, MD as PCP - General (Family Medicine) ? ?CHIEF COMPLAINTS:  ?Follow-up for continued evaluation and management of JAK2 positive myeloproliferative neoplasm ? ?INTERVAL HISTORY: ? ?Fernando Lee is a 76 y.o. male who is here for continued evaluation and management of JAK2 positive myeloproliferative neoplasm. ?He notes that his energy levels have improved over the last couple of weeks. ?He continues to be off his hydroxyurea. ?He notes that he has been walking more working in the yard and overall has been much more physically active.  His legs do not feel like lead anymore. ?Labs done today were reviewed with him in detail.  Hemoglobin up to 7.7. ?Platelets have remained relatively stable in the 580k range. ?No new infection issues. ?Parvovirus testing negative thus far. ?No indication for PRBC transfusion today. ? ?MEDICAL HISTORY:  ?Past Medical History:  ?Diagnosis Date  ? Arthritis of right knee   ? Cervicalgia   ? Chest discomfort   ? normal stress echo  ? Chest pain   ? Colon polyps   ? Dyslipidemia   ? Fluttering sensation of heart   ? GERD without esophagitis   ? Hematochezia   ? Hyperglycemia   ? Hyperlipidemia   ? Internal hemorrhoids   ? Kidney stones   ? Male erectile dysfunction, unspecified   ? Mixed dyslipidemia   ? Mixed hyperlipidemia   ? Ocular migraine   ? PAC (premature atrial contraction)   ? Personal history of colonic polyps   ? Prediabetes   ? Residual hemorrhoidal skin tags   ? Unspecified hemorrhoids   ? ? ?SURGICAL HISTORY: ?Past Surgical History:  ?Procedure Laterality Date  ? BONE MARROW BIOPSY    ? KIDNEY STONE SURGERY    ? retrieval  ? ? ?SOCIAL HISTORY: ?Social History  ? ?Socioeconomic History  ? Marital status: Married  ?  Spouse name: Not on file  ? Number of children: Not on file  ? Years of education: Not on file  ? Highest education level: Not on file  ?Occupational  History  ? Not on file  ?Tobacco Use  ? Smoking status: Former  ?  Types: Cigarettes  ? Smokeless tobacco: Never  ?Substance and Sexual Activity  ? Alcohol use: Yes  ?  Alcohol/week: 1.0 standard drink  ?  Types: 1 Cans of beer per week  ? Drug use: Not on file  ? Sexual activity: Not on file  ?Other Topics Concern  ? Not on file  ?Social History Narrative  ? Not on file  ? ?Social Determinants of Health  ? ?Financial Resource Strain: Not on file  ?Food Insecurity: Not on file  ?Transportation Needs: Not on file  ?Physical Activity: Not on file  ?Stress: Not on file  ?Social Connections: Not on file  ?Intimate Partner Violence: Not on file  ? ? ?FAMILY HISTORY: ?Family History  ?Problem Relation Age of Onset  ? Stroke Brother   ? Congestive Heart Failure Brother   ? ? ?ALLERGIES:  has No Known Allergies. ? ?MEDICATIONS:  ?Current Outpatient Medications  ?Medication Sig Dispense Refill  ? aspirin EC 81 MG tablet Take 81 mg by mouth daily. Swallow whole.    ? Cholecalciferol 50 MCG (2000 UT) TABS Take 2,000 Units by mouth daily.    ? docusate sodium (COLACE) 100 MG capsule Take 100 mg by mouth 2 (two) times daily.    ? FIBER  PO Take 1 capsule by mouth daily. Unknown strength    ? fish oil-omega-3 fatty acids 1000 MG capsule Take 1 g by mouth 2 (two) times daily. Strength 39m/1500mg    ? GLUCOSAMINE CHONDROITIN MSM PO Take 1 tablet by mouth daily. Strength 1500/1500    ? hydroxyurea (HYDREA) 500 MG capsule 1 tab (5079m po daily 5 days a week Monday through Friday. May take with food to minimize GI side effects. 60 capsule 2  ? iron polysaccharides (NIFEREX) 150 MG capsule Take 1 capsule (150 mg total) by mouth daily. 30 capsule 2  ? Misc Natural Products (PROSTATE SUPPORT PO) Take 1 capsule by mouth daily. Unknown strength    ? Multiple Vitamin (MULTIVITAMIN) tablet Take 1 tablet by mouth daily. Unknown strength    ? psyllium (METAMUCIL) 58.6 % powder Take 1 packet by mouth daily.    ? Turmeric (QC TUMERIC COMPLEX  PO) Take 750 mg by mouth daily. Unknown strenght    ? vitamin C (ASCORBIC ACID) 250 MG tablet Take 500 mg by mouth daily.    ? ?No current facility-administered medications for this visit.  ? ? ?REVIEW OF SYSTEMS:   ?10 Point review of Systems was done is negative except as noted above. ? ?PHYSICAL EXAMINATION: ?.BP (!) 104/58   Pulse 69   Temp 97.9 ?F (36.6 ?C)   Resp 20   Wt 176 lb 4.8 oz (80 kg)   SpO2 99%   BMI 27.61 kg/m?  ?NAD ?GENERAL:alert, in no acute distress and comfortable ?SKIN: no acute rashes, no significant lesions ?EYES: conjunctiva are pink and non-injected, sclera anicteric ?OROPHARYNX: MMM, no exudates, no oropharyngeal erythema or ulceration ?NECK: supple, no JVD ?LYMPH:  no palpable lymphadenopathy in the cervical, axillary or inguinal regions ?LUNGS: clear to auscultation b/l with normal respiratory effort ?HEART: regular rate & rhythm ?ABDOMEN:  normoactive bowel sounds , non tender, not distended. ?Extremity: no pedal edema ?PSYCH: alert & oriented x 3 with fluent speech ?NEURO: no focal motor/sensory deficits ? ?LABORATORY DATA:  ?I have reviewed the data as listed ? ?. ? ?  Latest Ref Rng & Units 02/05/2022  ? 12:58 PM 01/25/2022  ?  1:22 PM 01/14/2022  ? 12:24 PM  ?CBC  ?WBC 4.0 - 10.5 K/uL 12.5   13.9   11.6    ?Hemoglobin 13.0 - 17.0 g/dL 7.7   7.2   6.9    ?Hematocrit 39.0 - 52.0 % 23.0   21.2   20.9    ?Platelets 150 - 400 K/uL 584   601   520    ? ? ?. ? ?  Latest Ref Rng & Units 02/05/2022  ? 12:58 PM 01/25/2022  ?  1:22 PM 01/14/2022  ? 12:24 PM  ?CMP  ?Glucose 70 - 99 mg/dL 155   112   151    ?BUN 8 - 23 mg/dL _0 ?Creatinine 0.61 - 1.24 mg/dL 1.07   0.99   1.01    ?Sodium 135 - 145 mmol/L 139   138   138    ?Potassium 3.5 - 5.1 mmol/L 4.7   4.6   4.8    ?Chloride 98 - 111 mmol/L 105   105   105    ?CO2 22 - 32 mmol/L _1 ?Calcium 8.9 - 10.3 mg/dL 8.7   9.0   9.0    ?Total Protein 6.5 - 8.1 g/dL  6.1   6.3   6.4    ?Total Bilirubin 0.3 - 1.2 mg/dL 0.4   0.4    0.5    ?Alkaline Phos 38 - 126 U/L 53   54   52    ?AST 15 - 41 U/L _0 ?ALT 0 - 44 U/L _1 ? ?. ?Lab Results  ?Component Value Date  ? LDH 110 01/04/2022  ? ? ?02/23/2021 BCR ABL  ? ? ?02/23/2021 JAK2 ? ? ? ? ?RADIOGRAPHIC STUDIES: ?I have personally reviewed the radiological images as listed and agreed with the findings in the report. ?No results found. ? ? ?ASSESSMENT & PLAN:  ? ? ?1)JAK2-positive myeloproliferative neoplasm-primarily presenting with thrombocytosis.  No polycythemia.  Mild leukocytosis.   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 ?Has grade 1 out of 3 reticulin fibrosis.  Uncertain if this is primary or secondary.  LDH has remained within normal limits. ? ?PLAN ?-Patient continues to be off hydroxyurea due to his symptomatic anemia. ?-His hemoglobin levels have improved somewhat to 7.7, WBC count 12.5k, platelets 584k ?CMP stable ?Parvovirus IgM and PCR negative ?Patient notes that his overall energy levels have significantly improved over the last 2 weeks and he has been much more physically active. ?-We will continue to hold his hydroxyurea at this time ?-We will continue to take his p.o. iron and B complex ?-We will monitor his labs every 2 weeks for consideration of transfusion support.. ?-We will need to allow for permissive thrombocytosis at this time pending improvement in his anemia. ?-Continue aspirin ? ? ?Follow-up ?Labs with 1 units of prbc if needed in 14 days ?Labs with 1 unit of PRBC and MD visit in 28 days ? ?The total time spent in the appointment was 20 minutes*. ? ?All of the patient's questions were answered with apparent satisfaction. The patient knows to call the clinic with any problems, questions or concerns. ? ? ?Sullivan Lone MD MS AAHIVMS [facility] ?Hematology/Oncology Physician ?[place] ? ?.*Total Encounter Time as defined by the Centers for Medicare and Medicaid Services includes, in  addition to the face-to-face time of a patient visit (documented in the note above) non-face-to-face time: obtaining and reviewing outside history, ordering and reviewing medications, tests or procedures, care c

## 2022-02-08 LAB — PARVOVIRUS B19 IGG: Parovirus B19 IgG Abs: 0.3 index (ref 0.0–0.8)

## 2022-02-17 ENCOUNTER — Other Ambulatory Visit: Payer: Self-pay

## 2022-02-17 DIAGNOSIS — D471 Chronic myeloproliferative disease: Secondary | ICD-10-CM

## 2022-02-19 ENCOUNTER — Other Ambulatory Visit: Payer: Self-pay

## 2022-02-19 ENCOUNTER — Inpatient Hospital Stay: Payer: Medicare Other

## 2022-02-19 ENCOUNTER — Inpatient Hospital Stay: Payer: Medicare Other | Attending: Hematology

## 2022-02-19 DIAGNOSIS — D72829 Elevated white blood cell count, unspecified: Secondary | ICD-10-CM | POA: Insufficient documentation

## 2022-02-19 DIAGNOSIS — D7581 Myelofibrosis: Secondary | ICD-10-CM | POA: Diagnosis not present

## 2022-02-19 DIAGNOSIS — D471 Chronic myeloproliferative disease: Secondary | ICD-10-CM

## 2022-02-19 DIAGNOSIS — D473 Essential (hemorrhagic) thrombocythemia: Secondary | ICD-10-CM | POA: Insufficient documentation

## 2022-02-19 DIAGNOSIS — D539 Nutritional anemia, unspecified: Secondary | ICD-10-CM | POA: Diagnosis not present

## 2022-02-19 LAB — CMP (CANCER CENTER ONLY)
ALT: 19 U/L (ref 0–44)
AST: 14 U/L — ABNORMAL LOW (ref 15–41)
Albumin: 3.9 g/dL (ref 3.5–5.0)
Alkaline Phosphatase: 51 U/L (ref 38–126)
Anion gap: 3 — ABNORMAL LOW (ref 5–15)
BUN: 21 mg/dL (ref 8–23)
CO2: 28 mmol/L (ref 22–32)
Calcium: 8.5 mg/dL — ABNORMAL LOW (ref 8.9–10.3)
Chloride: 107 mmol/L (ref 98–111)
Creatinine: 0.99 mg/dL (ref 0.61–1.24)
GFR, Estimated: >60 mL/min (ref >60)
Glucose, Bld: 177 mg/dL — ABNORMAL HIGH (ref 70–99)
Potassium: 4.7 mmol/L (ref 3.5–5.1)
Sodium: 138 mmol/L (ref 135–145)
Total Bilirubin: 0.4 mg/dL (ref 0.3–1.2)
Total Protein: 6.1 g/dL — ABNORMAL LOW (ref 6.5–8.1)

## 2022-02-19 LAB — SAMPLE TO BLOOD BANK

## 2022-02-19 LAB — CBC WITH DIFFERENTIAL (CANCER CENTER ONLY)
Abs Immature Granulocytes: 0.26 10*3/uL — ABNORMAL HIGH (ref 0.00–0.07)
Basophils Absolute: 0.2 10*3/uL — ABNORMAL HIGH (ref 0.0–0.1)
Basophils Relative: 2 %
Eosinophils Absolute: 1.1 10*3/uL — ABNORMAL HIGH (ref 0.0–0.5)
Eosinophils Relative: 9 %
HCT: 19.2 % — ABNORMAL LOW (ref 39.0–52.0)
Hemoglobin: 6.6 g/dL — CL (ref 13.0–17.0)
Immature Granulocytes: 2 %
Lymphocytes Relative: 7 %
Lymphs Abs: 0.8 10*3/uL (ref 0.7–4.0)
MCH: 36.1 pg — ABNORMAL HIGH (ref 26.0–34.0)
MCHC: 34.4 g/dL (ref 30.0–36.0)
MCV: 104.9 fL — ABNORMAL HIGH (ref 80.0–100.0)
Monocytes Absolute: 0.8 10*3/uL (ref 0.1–1.0)
Monocytes Relative: 6 %
Neutro Abs: 8.7 10*3/uL — ABNORMAL HIGH (ref 1.7–7.7)
Neutrophils Relative %: 74 %
Platelet Count: 608 10*3/uL — ABNORMAL HIGH (ref 150–400)
RBC: 1.83 MIL/uL — ABNORMAL LOW (ref 4.22–5.81)
RDW: 19.2 % — ABNORMAL HIGH (ref 11.5–15.5)
WBC Count: 11.8 10*3/uL — ABNORMAL HIGH (ref 4.0–10.5)
nRBC: 0 % (ref 0.0–0.2)

## 2022-02-19 LAB — PREPARE RBC (CROSSMATCH)

## 2022-02-19 MED ORDER — METHYLPREDNISOLONE SODIUM SUCC 40 MG IJ SOLR
40.0000 mg | Freq: Once | INTRAMUSCULAR | Status: AC
  Administered 2022-02-19: 40 mg via INTRAVENOUS
  Filled 2022-02-19: qty 1

## 2022-02-19 MED ORDER — SODIUM CHLORIDE 0.9% IV SOLUTION: 250.0000 mL | Freq: Once | INTRAVENOUS | Status: DC

## 2022-02-19 MED ORDER — ACETAMINOPHEN 325 MG PO TABS
650.0000 mg | ORAL_TABLET | Freq: Once | ORAL | Status: AC
  Administered 2022-02-19: 650 mg via ORAL
  Filled 2022-02-19: qty 2

## 2022-02-19 NOTE — Progress Notes (Signed)
CRITICAL VALUE STICKER ? ?CRITICAL VALUE: Hgb 6.6  ? ?DATE & TIME NOTIFIED: 02/19/22 10:35 ? ?MD NOTIFIED: Dr Irene Limbo ? ?TIME OF NOTIFICATION:10:40 ? ? ?

## 2022-02-19 NOTE — Patient Instructions (Signed)
Blood Transfusion, Adult A blood transfusion is a procedure in which you receive blood or a type of blood cell (blood component) through an IV. You may need a blood transfusion when your blood level is low. This may result from a bleeding disorder, illness, injury, or surgery. The blood may come from a donor. You may also be able to donate blood for yourself (autologous blood donation) before a planned surgery. The blood given in a transfusion is made up of different blood components. You may receive: Red blood cells. These carry oxygen to the cells in the body. Platelets. These help your blood to clot. Plasma. This is the liquid part of your blood. It carries proteins and other substances throughout the body. White blood cells. These help you fight infections. If you have hemophilia or another clotting disorder, you may also receive other types of blood products. Tell a health care provider about: Any blood disorders you have. Any previous reactions you have had during a blood transfusion. Any allergies you have. All medicines you are taking, including vitamins, herbs, eye drops, creams, and over-the-counter medicines. Any surgeries you have had. Any medical conditions you have, including any recent fever or cold symptoms. Whether you are pregnant or may be pregnant. What are the risks? Generally, this is a safe procedure. However, problems may occur. The most common problems include: A mild allergic reaction, such as red, swollen areas of skin (hives) and itching. Fever or chills. This may be the body's response to new blood cells received. This may occur during or up to 4 hours after the transfusion. More serious problems may include: Transfusion-associated circulatory overload (TACO), or too much fluid in the lungs. This may cause breathing problems. A serious allergic reaction, such as difficulty breathing or swelling around the face and lips. Transfusion-related acute lung injury  (TRALI), which causes breathing difficulty and low oxygen in the blood. This can occur within hours of the transfusion or several days later. Iron overload. This can happen after receiving many blood transfusions over a period of time. Infection or virus being transmitted. This is rare because donated blood is carefully tested before it is given. Hemolytic transfusion reaction. This is rare. It happens when your body's defense system (immune system)tries to attack the new blood cells. Symptoms may include fever, chills, nausea, low blood pressure, and low back or chest pain. Transfusion-associated graft-versus-host disease (TAGVHD). This is rare. It happens when donated cells attack your body's healthy tissues. What happens before the procedure? Medicines Ask your health care provider about: Changing or stopping your regular medicines. This is especially important if you are taking diabetes medicines or blood thinners. Taking medicines such as aspirin and ibuprofen. These medicines can thin your blood. Do not take these medicines unless your health care provider tells you to take them. Taking over-the-counter medicines, vitamins, herbs, and supplements. General instructions Follow instructions from your health care provider about eating and drinking restrictions. You will have a blood test to determine your blood type. This is necessary to know what kind of blood your body will accept and to match it to the donor blood. If you are going to have a planned surgery, you may be able to do an autologous blood donation. This may be done in case you need to have a transfusion. You will have your temperature, blood pressure, and pulse monitored before the transfusion. If you have had an allergic reaction to a transfusion in the past, you may be given medicine to help prevent   a reaction. This medicine may be given to you by mouth (orally) or through an IV. Set aside time for the blood transfusion. This  procedure generally takes 1-4 hours to complete. What happens during the procedure?  An IV will be inserted into one of your veins. The bag of donated blood will be attached to your IV. The blood will then enter through your vein. Your temperature, blood pressure, and pulse will be monitored regularly during the transfusion. This monitoring is done to detect early signs of a transfusion reaction. Tell your nurse right away if you have any of these symptoms during the transfusion: Shortness of breath or trouble breathing. Chest or back pain. Fever or chills. Hives or itching. If you have any signs or symptoms of a reaction, your transfusion will be stopped and you may be given medicine. When the transfusion is complete, your IV will be removed. Pressure may be applied to the IV site for a few minutes. A bandage (dressing)will be applied. The procedure may vary among health care providers and hospitals. What happens after the procedure? Your temperature, blood pressure, pulse, breathing rate, and blood oxygen level will be monitored until you leave the hospital or clinic. Your blood may be tested to see how you are responding to the transfusion. You may be warmed with fluids or blankets to maintain a normal body temperature. If you receive your blood transfusion in an outpatient setting, you will be told whom to contact to report any reactions. Where to find more information For more information on blood transfusions, visit the [employer]: redcross.org Summary A blood transfusion is a procedure in which you receive blood or a type of blood cell (blood component) through an IV. The blood you receive may come from a donor or be donated by yourself (autologous blood donation) before a planned surgery. The blood given in a transfusion is made up of different blood components. You may receive red blood cells, platelets, plasma, or white blood cells depending on the condition treated. Your  temperature, blood pressure, and pulse will be monitored before, during, and after the transfusion. After the transfusion, your blood may be tested to see how your body has responded. This information is not intended to replace advice given to you by your health care provider. Make sure you discuss any questions you have with your health care provider. Document Revised: 08/09/2019 Document Reviewed: 03/29/2019 Elsevier Patient Education  2023 [employer]  

## 2022-02-20 LAB — TYPE AND SCREEN
ABO/RH(D): A POS
Antibody Screen: NEGATIVE
Unit division: 0

## 2022-02-20 LAB — BPAM RBC
Blood Product Expiration Date: 202305312359
ISSUE DATE / TIME: 202305051144
Unit Type and Rh: 5100

## 2022-03-05 ENCOUNTER — Ambulatory Visit: Payer: Medicare Other | Admitting: Hematology

## 2022-03-05 ENCOUNTER — Other Ambulatory Visit: Payer: Medicare Other

## 2022-03-09 ENCOUNTER — Other Ambulatory Visit: Payer: Self-pay

## 2022-03-09 ENCOUNTER — Ambulatory Visit: Payer: Medicare Other | Admitting: Cardiology

## 2022-03-09 ENCOUNTER — Encounter: Payer: Self-pay | Admitting: Cardiology

## 2022-03-09 VITALS — BP 98/58 | HR 75 | Ht 67.0 in | Wt 175.0 lb

## 2022-03-09 DIAGNOSIS — D471 Chronic myeloproliferative disease: Secondary | ICD-10-CM

## 2022-03-09 DIAGNOSIS — G4733 Obstructive sleep apnea (adult) (pediatric): Secondary | ICD-10-CM

## 2022-03-09 DIAGNOSIS — I472 Ventricular tachycardia, unspecified: Secondary | ICD-10-CM | POA: Diagnosis not present

## 2022-03-09 DIAGNOSIS — I4729 Other ventricular tachycardia: Secondary | ICD-10-CM | POA: Diagnosis not present

## 2022-03-09 DIAGNOSIS — R002 Palpitations: Secondary | ICD-10-CM | POA: Diagnosis not present

## 2022-03-09 DIAGNOSIS — R7303 Prediabetes: Secondary | ICD-10-CM

## 2022-03-09 NOTE — Patient Instructions (Signed)

## 2022-03-09 NOTE — Progress Notes (Signed)
Cardiology Office Note:    Date:  03/09/2022   ID:  SEVEN DOLLENS, DOB 1946/09/21, MRN [id number]  PCP:  Vernie Shanks, MD  Cardiologist:  Jenne Campus, MD    Referring MD: Vernie Shanks, MD   Chief Complaint  Patient presents with   discuss hemoglobin low    Discuss diet      History of Present Illness:    RHONIN TROTT is a 76 y.o. male with past medical history significant for palpitations, dyslipidemia, thrombocytosis.  Originally referred to [place] because of ventricular ectopy however at that time he is to drink a lot of coffee he stopped doing it palpitation when away.  He did have echocardiogram showed preserved ejection fraction monitor however showed a run of nonsustained ventricular tachycardia.  Since that time he is completely asymptomatic in terms of arrhythmia he did develop significant anemia and required multiple blood transfusions.  He is weak tired exhausted his hemoglobin is usually between 6.6 and 7.2.  He complained of being exhausted tired and weak.  Cardiac wise stable  Past Medical History:  Diagnosis Date   Arthritis of right knee    Cervicalgia    Chest discomfort    normal stress echo   Chest pain    Colon polyps    Dyslipidemia    Fluttering sensation of heart    GERD without esophagitis    Hematochezia    Hyperglycemia    Hyperlipidemia    Internal hemorrhoids    Kidney stones    Male erectile dysfunction, unspecified    Mixed dyslipidemia    Mixed hyperlipidemia    Ocular migraine    PAC (premature atrial contraction)    Personal history of colonic polyps    Prediabetes    Residual hemorrhoidal skin tags    Unspecified hemorrhoids     Past Surgical History:  Procedure Laterality Date   BONE MARROW BIOPSY     KIDNEY STONE SURGERY     retrieval    Current Medications: Current Meds  Medication Sig   aspirin EC 81 MG tablet Take 81 mg by mouth daily. Swallow whole.   B Complex Vitamins (B COMPLEX PO) Take 1 tablet by mouth  daily.   Cholecalciferol 50 MCG (2000 UT) TABS Take 2,000 Units by mouth daily.   docusate sodium (COLACE) 100 MG capsule Take 100 mg by mouth 2 (two) times daily.   FIBER PO Take 1 capsule by mouth daily. Unknown strength   fish oil-omega-3 fatty acids 1000 MG capsule Take 1 g by mouth 2 (two) times daily. Strength 364m/1500mg   GLUCOSAMINE CHONDROITIN MSM PO Take 1 tablet by mouth daily. Strength 1500/1500   hydroxyurea (HYDREA) 500 MG capsule 1 tab (5025m po daily 5 days a week Monday through Friday. May take with food to minimize GI side effects.   iron polysaccharides (NIFEREX) 150 MG capsule Take 1 capsule (150 mg total) by mouth daily.   Misc Natural Products (PROSTATE SUPPORT PO) Take 1 capsule by mouth daily. Unknown strength   Multiple Vitamin (MULTIVITAMIN) tablet Take 1 tablet by mouth daily. Unknown strength   psyllium (METAMUCIL) 58.6 % powder Take 1 packet by mouth daily.   Turmeric (QC TUMERIC COMPLEX PO) Take 750 mg by mouth daily. Unknown strenght   vitamin C (ASCORBIC ACID) 250 MG tablet Take 500 mg by mouth daily.     Allergies:   Patient has no known allergies.   Social History   Socioeconomic History   Marital status:  Married    Spouse name: Not on file   Number of children: Not on file   Years of education: Not on file   Highest education level: Not on file  Occupational History   Not on file  Tobacco Use   Smoking status: Former    Types: Cigarettes   Smokeless tobacco: Never  Substance and Sexual Activity   Alcohol use: Yes    Alcohol/week: 1.0 standard drink    Types: 1 Cans of beer per week   Drug use: Not on file   Sexual activity: Not on file  Other Topics Concern   Not on file  Social History Narrative   Not on file   Social Determinants of Health   Financial Resource Strain: Not on file  Food Insecurity: Not on file  Transportation Needs: Not on file  Physical Activity: Not on file  Stress: Not on file  Social Connections: Not on file      Family History: The patient's family history includes Congestive Heart Failure in his brother; Stroke in his brother. ROS:   Please see the history of present illness.    All 14 point review of systems negative except as described per history of present illness  EKGs/Labs/Other Studies Reviewed:      Recent Labs: 02/19/2022: ALT 19; BUN 21; Creatinine 0.99; Hemoglobin 6.6; Platelet Count 608; Potassium 4.7; Sodium 138  Recent Lipid Panel No results found for: CHOL, TRIG, HDL, CHOLHDL, VLDL, LDLCALC, LDLDIRECT  Physical Exam:    VS:  BP (!) 98/58 (BP Location: Left Arm, Patient Position: Sitting)   Pulse 75   Ht _0  (1.702 m)   Wt 175 lb (79.4 kg)   SpO2 94%   BMI 27.41 kg/m     Wt Readings from Last 3 Encounters:  03/09/22 175 lb (79.4 kg)  02/05/22 176 lb 4.8 oz (80 kg)  01/14/22 175 lb 9.6 oz (79.7 kg)     GEN:  Well nourished, well developed in no acute distress HEENT: Normal NECK: No JVD; No carotid bruits LYMPHATICS: No lymphadenopathy CARDIAC: RRR, no murmurs, no rubs, no gallops RESPIRATORY:  Clear to auscultation without rales, wheezing or rhonchi  ABDOMEN: Soft, non-tender, non-distended MUSCULOSKELETAL:  No edema; No deformity  SKIN: Warm and dry LOWER EXTREMITIES: no swelling NEUROLOGIC:  Alert and oriented x 3 PSYCHIATRIC:  Normal affect   ASSESSMENT:    1. Ventricular tachycardia ([place])   2. Nonsustained ventricular tachycardia ([place])   3. Obstructive sleep apnea   4. Palpitations   5. Prediabetes    PLAN:    In order of problems listed above:  Ventricular tachycardia: Denies having dizziness or passing out, overall asymptomatic doing well continue present management ejection fraction normal based on last echocardiogram Obstructive sleep apnea that being followed by internal medicine team Palpitations denies having any Cardiac wise doing well continue present management   Medication Adjustments/Labs and Tests Ordered: Current medicines  are reviewed at length with the patient today.  Concerns regarding medicines are outlined above.  Orders Placed This Encounter  Procedures   EKG 12-Lead   Medication changes: No orders of the defined types were placed in this encounter.   Signed, Park Liter, MD, [facility] 03/09/2022 12:13 PM    [place]

## 2022-03-10 ENCOUNTER — Other Ambulatory Visit: Payer: Self-pay

## 2022-03-10 ENCOUNTER — Inpatient Hospital Stay: Payer: Medicare Other

## 2022-03-10 ENCOUNTER — Inpatient Hospital Stay (HOSPITAL_BASED_OUTPATIENT_CLINIC_OR_DEPARTMENT_OTHER): Payer: Medicare Other | Admitting: Hematology

## 2022-03-10 VITALS — BP 109/59 | HR 66 | Resp 20 | Wt 175.1 lb

## 2022-03-10 DIAGNOSIS — Z1589 Genetic susceptibility to other disease: Secondary | ICD-10-CM | POA: Diagnosis not present

## 2022-03-10 DIAGNOSIS — D471 Chronic myeloproliferative disease: Secondary | ICD-10-CM | POA: Diagnosis not present

## 2022-03-10 DIAGNOSIS — D539 Nutritional anemia, unspecified: Secondary | ICD-10-CM | POA: Diagnosis not present

## 2022-03-10 DIAGNOSIS — D473 Essential (hemorrhagic) thrombocythemia: Secondary | ICD-10-CM | POA: Diagnosis not present

## 2022-03-10 DIAGNOSIS — D7581 Myelofibrosis: Secondary | ICD-10-CM | POA: Diagnosis not present

## 2022-03-10 DIAGNOSIS — D72829 Elevated white blood cell count, unspecified: Secondary | ICD-10-CM | POA: Diagnosis not present

## 2022-03-10 LAB — CMP (CANCER CENTER ONLY)
ALT: 17 U/L (ref 0–44)
AST: 16 U/L (ref 15–41)
Albumin: 4.2 g/dL (ref 3.5–5.0)
Alkaline Phosphatase: 59 U/L (ref 38–126)
Anion gap: 5 (ref 5–15)
BUN: 18 mg/dL (ref 8–23)
CO2: 27 mmol/L (ref 22–32)
Calcium: 8.9 mg/dL (ref 8.9–10.3)
Chloride: 106 mmol/L (ref 98–111)
Creatinine: 1.09 mg/dL (ref 0.61–1.24)
GFR, Estimated: >60 mL/min (ref >60)
Glucose, Bld: 106 mg/dL — ABNORMAL HIGH (ref 70–99)
Potassium: 4.7 mmol/L (ref 3.5–5.1)
Sodium: 138 mmol/L (ref 135–145)
Total Bilirubin: 0.5 mg/dL (ref 0.3–1.2)
Total Protein: 6.5 g/dL (ref 6.5–8.1)

## 2022-03-10 LAB — CBC WITH DIFFERENTIAL (CANCER CENTER ONLY)
Abs Immature Granulocytes: 0.51 10*3/uL — ABNORMAL HIGH (ref 0.00–0.07)
Basophils Absolute: 0.3 10*3/uL — ABNORMAL HIGH (ref 0.0–0.1)
Basophils Relative: 2 %
Eosinophils Absolute: 1.6 10*3/uL — ABNORMAL HIGH (ref 0.0–0.5)
Eosinophils Relative: 10 %
HCT: 19.8 % — ABNORMAL LOW (ref 39.0–52.0)
Hemoglobin: 6.6 g/dL — CL (ref 13.0–17.0)
Immature Granulocytes: 3 %
Lymphocytes Relative: 6 %
Lymphs Abs: 1 10*3/uL (ref 0.7–4.0)
MCH: 34 pg (ref 26.0–34.0)
MCHC: 33.3 g/dL (ref 30.0–36.0)
MCV: 102.1 fL — ABNORMAL HIGH (ref 80.0–100.0)
Monocytes Absolute: 1.1 10*3/uL — ABNORMAL HIGH (ref 0.1–1.0)
Monocytes Relative: 7 %
Neutro Abs: 11 10*3/uL — ABNORMAL HIGH (ref 1.7–7.7)
Neutrophils Relative %: 72 %
Platelet Count: 701 10*3/uL — ABNORMAL HIGH (ref 150–400)
RBC: 1.94 MIL/uL — ABNORMAL LOW (ref 4.22–5.81)
RDW: 18.5 % — ABNORMAL HIGH (ref 11.5–15.5)
WBC Count: 15.4 10*3/uL — ABNORMAL HIGH (ref 4.0–10.5)
nRBC: 0 % (ref 0.0–0.2)

## 2022-03-10 LAB — PREPARE RBC (CROSSMATCH)

## 2022-03-10 LAB — SAMPLE TO BLOOD BANK

## 2022-03-10 MED ORDER — SODIUM CHLORIDE 0.9% IV SOLUTION
250.0000 mL | Freq: Once | INTRAVENOUS | Status: AC
  Administered 2022-03-10: 250 mL via INTRAVENOUS

## 2022-03-10 MED ORDER — METHYLPREDNISOLONE SODIUM SUCC 40 MG IJ SOLR
40.0000 mg | Freq: Once | INTRAMUSCULAR | Status: AC
  Administered 2022-03-10: 40 mg via INTRAVENOUS
  Filled 2022-03-10: qty 1

## 2022-03-10 MED ORDER — ACETAMINOPHEN 325 MG PO TABS
650.0000 mg | ORAL_TABLET | Freq: Once | ORAL | Status: AC
  Administered 2022-03-10: 650 mg via ORAL
  Filled 2022-03-10: qty 2

## 2022-03-10 NOTE — Patient Instructions (Signed)
Blood Transfusion, Adult, Care After This sheet gives you information about how to care for yourself after your procedure. Your doctor may also give you more specific instructions. If you have problems or questions, contact your doctor. What can I expect after the procedure? After the procedure, it is common to have: Bruising and soreness at the IV site. A headache. Follow these instructions at home: Insertion site care     Follow instructions from your doctor about how to take care of your insertion site. This is where an IV tube was put into your vein. Make sure you: Wash your hands with soap and water before and after you change your bandage (dressing). If you cannot use soap and water, use hand sanitizer. Change your bandage as told by your doctor. Check your insertion site every day for signs of infection. Check for: Redness, swelling, or pain. Bleeding from the site. Warmth. Pus or a bad smell. General instructions Take over-the-counter and prescription medicines only as told by your doctor. Rest as told by your doctor. Go back to your normal activities as told by your doctor. Keep all follow-up visits as told by your doctor. This is important. Contact a doctor if: You have itching or red, swollen areas of skin (hives). You feel worried or nervous (anxious). You feel weak after doing your normal activities. You have redness, swelling, warmth, or pain around the insertion site. You have blood coming from the insertion site, and the blood does not stop with pressure. You have pus or a bad smell coming from the insertion site. Get help right away if: You have signs of a serious reaction. This may be coming from an allergy or the body's defense system (immune system). Signs include: Trouble breathing or shortness of breath. Swelling of the face or feeling warm (flushed). Fever or chills. Head, chest, or back pain. Dark pee (urine) or blood in the pee. Widespread rash. Fast  heartbeat. Feeling dizzy or light-headed. You may receive your blood transfusion in an outpatient setting. If so, you will be told whom to contact to report any reactions. These symptoms may be an emergency. Do not wait to see if the symptoms will go away. Get medical help right away. Call your local emergency services (911 in the [place] Do not drive yourself to the hospital. Summary Bruising and soreness at the IV site are common. Check your insertion site every day for signs of infection. Rest as told by your doctor. Go back to your normal activities as told by your doctor. Get help right away if you have signs of a serious reaction. This information is not intended to replace advice given to you by your health care provider. Make sure you discuss any questions you have with your health care provider. Document Revised: 01/29/2021 Document Reviewed: 03/29/2019 Elsevier Patient Education  2023 Elsevier Inc.  

## 2022-03-10 NOTE — Progress Notes (Signed)
HEMATOLOGY/ONCOLOGY PROGRESS NOTE:   Date of Service: 03/10/2022   Patient Care Team: Vernie Shanks, MD as PCP - General (Family Medicine)  CHIEF COMPLAINTS:  Follow-up for continued evaluation and management of JAK2 positive myeloproliferative neoplasm  INTERVAL HISTORY: Fernando Lee is a 76 y.o. male who is here for continued evaluation and management of JAK2 positive myeloproliferative neoplasm. He reports He is doing well with no new symptoms or concerns.  He reports some weakness.  We discussed a potential bone marrow biopsy which he was agreeable to.  He notes he feels good for the first day or two after PRBC transfusion.  He notes some occasional swelling in ankles.  He reports urgency at night. He says that last night he was up every hour.  He notes some stiffness in his knees.  He continues to be off his hydroxyurea.  No new legions or rashes. No abdominal pain or distension. No black or bloody stools. No infection issues. No other new or acute focal symptoms.  Labs done today were reviewed in detail. CBC shows hemoglobin 6.6, WBC count 15.4k, platelets 701k, ANC elevated at 11.0k, RBC at 1.94 and MCV 102.1 consistent with macrocytic anemia. CMP stable  He will get 2 units PRBC transfusion today.  MEDICAL HISTORY:  Past Medical History:  Diagnosis Date   Arthritis of right knee    Cervicalgia    Chest discomfort    normal stress echo   Chest pain    Colon polyps    Dyslipidemia    Fluttering sensation of heart    GERD without esophagitis    Hematochezia    Hyperglycemia    Hyperlipidemia    Internal hemorrhoids    Kidney stones    Male erectile dysfunction, unspecified    Mixed dyslipidemia    Mixed hyperlipidemia    Ocular migraine    PAC (premature atrial contraction)    Personal history of colonic polyps    Prediabetes    Residual hemorrhoidal skin tags    Unspecified hemorrhoids     SURGICAL HISTORY: Past Surgical History:   Procedure Laterality Date   BONE MARROW BIOPSY     KIDNEY STONE SURGERY     retrieval    SOCIAL HISTORY: Social History   Socioeconomic History   Marital status: Married    Spouse name: Not on file   Number of children: Not on file   Years of education: Not on file   Highest education level: Not on file  Occupational History   Not on file  Tobacco Use   Smoking status: Former    Types: Cigarettes   Smokeless tobacco: Never  Substance and Sexual Activity   Alcohol use: Yes    Alcohol/week: 1.0 standard drink    Types: 1 Cans of beer per week   Drug use: Not on file   Sexual activity: Not on file  Other Topics Concern   Not on file  Social History Narrative   Not on file   Social Determinants of Health   Financial Resource Strain: Not on file  Food Insecurity: Not on file  Transportation Needs: Not on file  Physical Activity: Not on file  Stress: Not on file  Social Connections: Not on file  Intimate Partner Violence: Not on file    FAMILY HISTORY: Family History  Problem Relation Age of Onset   Stroke Brother    Congestive Heart Failure Brother     ALLERGIES:  has No Known Allergies.  MEDICATIONS:  Current Outpatient Medications  Medication Sig Dispense Refill   aspirin EC 81 MG tablet Take 81 mg by mouth daily. Swallow whole.     B Complex Vitamins (B COMPLEX PO) Take 1 tablet by mouth daily.     Cholecalciferol 50 MCG (2000 UT) TABS Take 2,000 Units by mouth daily.     docusate sodium (COLACE) 100 MG capsule Take 100 mg by mouth 2 (two) times daily.     FIBER PO Take 1 capsule by mouth daily. Unknown strength     fish oil-omega-3 fatty acids 1000 MG capsule Take 1 g by mouth 2 (two) times daily. Strength 312m/1500mg     GLUCOSAMINE CHONDROITIN MSM PO Take 1 tablet by mouth daily. Strength 1500/1500     hydroxyurea (HYDREA) 500 MG capsule 1 tab (5064m po daily 5 days a week Monday through Friday. May take with food to minimize GI side effects. 60  capsule 2   iron polysaccharides (NIFEREX) 150 MG capsule Take 1 capsule (150 mg total) by mouth daily. 30 capsule 2   Misc Natural Products (PROSTATE SUPPORT PO) Take 1 capsule by mouth daily. Unknown strength     Multiple Vitamin (MULTIVITAMIN) tablet Take 1 tablet by mouth daily. Unknown strength     psyllium (METAMUCIL) 58.6 % powder Take 1 packet by mouth daily.     Turmeric (QC TUMERIC COMPLEX PO) Take 750 mg by mouth daily. Unknown strenght     vitamin C (ASCORBIC ACID) 250 MG tablet Take 500 mg by mouth daily.     No current facility-administered medications for this visit.    REVIEW OF SYSTEMS:   10 Point review of Systems was done is negative except as noted above.  PHYSICAL EXAMINATION: .BP (!) 109/59   Pulse 66   Resp 20   Wt 175 lb 1.6 oz (79.4 kg)   SpO2 100%   BMI 27.42 kg/m  NAD GENERAL:alert, in no acute distress and comfortable SKIN: no acute rashes, no significant lesions EYES: conjunctiva are pink and non-injected, sclera anicteric NECK: supple, no JVD LYMPH:  no palpable lymphadenopathy in the cervical, axillary or inguinal regions LUNGS: clear to auscultation b/l with normal respiratory effort HEART: regular rate & rhythm ABDOMEN:  normoactive bowel sounds , non tender, not distended. Extremity: no pedal edema PSYCH: alert & oriented x 3 with fluent speech NEURO: no focal motor/sensory deficits  LABORATORY DATA:  I have reviewed the data as listed  .    Latest Ref Rng & Units 02/19/2022   10:08 AM 02/05/2022   12:58 PM 01/25/2022    1:22 PM  CBC  WBC 4.0 - 10.5 K/uL 11.8   12.5   13.9    Hemoglobin 13.0 - 17.0 g/dL 6.6   7.7   7.2    Hematocrit 39.0 - 52.0 % 19.2   23.0   21.2    Platelets 150 - 400 K/uL 608   584   601      .    Latest Ref Rng & Units 02/19/2022   10:08 AM 02/05/2022   12:58 PM 01/25/2022    1:22 PM  CMP  Glucose 70 - 99 mg/dL 177   155   112    BUN 8 - 23 mg/dL _0 Creatinine 0.61 - 1.24 mg/dL 0.99   1.07   0.99     Sodium 135 - 145 mmol/L 138   139   138    Potassium 3.5 -  5.1 mmol/L 4.7   4.7   4.6    Chloride 98 - 111 mmol/L 107   105   105    CO2 22 - 32 mmol/L _0 Calcium 8.9 - 10.3 mg/dL 8.5   8.7   9.0    Total Protein 6.5 - 8.1 g/dL 6.1   6.1   6.3    Total Bilirubin 0.3 - 1.2 mg/dL 0.4   0.4   0.4    Alkaline Phos 38 - 126 U/L 51   53   54    AST 15 - 41 U/L _1 ALT 0 - 44 U/L _2 . Lab Results  Component Value Date   LDH 110 01/04/2022    02/23/2021 BCR ABL    02/23/2021 JAK2     RADIOGRAPHIC STUDIES: I have personally reviewed the radiological images as listed and agreed with the findings in the report. No results found.   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  No polycythemia.  Mild leukocytosis.   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 Has grade 1 out of 3 reticulin fibrosis.  Uncertain if this is primary or secondary.  LDH has remained within normal limits.  PLAN -Patient continues to be off hydroxyurea due to his symptomatic anemia. -Labs done today were reviewed in detail. CBC shows hemoglobin 6.6, WBC count 15.4k, platelets 701k, ANC elevated at 11.0k, RBC at 1.94 and MCV 102.1 consistent with macrocytic anemia. CMP stable -We will continue to hold his hydroxyurea at this time -We will continue to take his p.o. iron and B complex -We will monitor his labs every 2 weeks for consideration of transfusion support. -We will need to allow for permissive thrombocytosis at this time pending improvement in his anemia. -Continue aspirin -Today we will do PRBC 2 units and then PRBC transfusion 1 unit every 2 weeks. -Labs and 1 unit of PRBC in 2 weeks and will repeat in 4 weeks. -Schedule CT bone marrow aspiration and biopsy in 1 week. -RTC in 4 weeks.   Follow-up 2 units today of PRBC Labs and 1 unit of PRBC in 2  weeks Labs and 1 unit of PRBC In 4 weeks CT bone marrow aspiration and biopsy in 1 week RTC with Dr Irene Limbo in 4 weeks  The total time spent in the appointment was 30 minutes*.  All of the patient's questions were answered with apparent satisfaction. The patient knows to call the clinic with any problems, questions or concerns.   Sullivan Lone MD MS AAHIVMS [facility] Hematology/Oncology Physician [facility]  .*Total Encounter Time as defined by the Centers for Medicare and Medicaid Services includes, in addition to the face-to-face time of a patient visit (documented in the note above) non-face-to-face time: obtaining and reviewing outside history, ordering and reviewing medications, tests or procedures, care coordination (communications with other health care professionals or caregivers) and documentation in the medical record.  I, Melene Muller, am acting as scribe for Dr. Sullivan Lone, MD.  .I have reviewed the above documentation for accuracy and completeness, and I agree with the above. Brunetta Genera MD

## 2022-03-10 NOTE — Progress Notes (Signed)
CRITICAL VALUE STICKER  CRITICAL VALUE: HGB 6.6  DATE & TIME NOTIFIED: 03/10/22 12:25  MD NOTIFIED: Dr Irene Limbo  TIME OF NOTIFICATION: 12:26

## 2022-03-11 ENCOUNTER — Inpatient Hospital Stay: Payer: Medicare Other

## 2022-03-11 DIAGNOSIS — D539 Nutritional anemia, unspecified: Secondary | ICD-10-CM | POA: Diagnosis not present

## 2022-03-11 DIAGNOSIS — D7581 Myelofibrosis: Secondary | ICD-10-CM | POA: Diagnosis not present

## 2022-03-11 DIAGNOSIS — D72829 Elevated white blood cell count, unspecified: Secondary | ICD-10-CM | POA: Diagnosis not present

## 2022-03-11 DIAGNOSIS — D473 Essential (hemorrhagic) thrombocythemia: Secondary | ICD-10-CM | POA: Diagnosis not present

## 2022-03-11 DIAGNOSIS — D471 Chronic myeloproliferative disease: Secondary | ICD-10-CM

## 2022-03-11 MED ORDER — METHYLPREDNISOLONE SODIUM SUCC 40 MG IJ SOLR
40.0000 mg | Freq: Once | INTRAMUSCULAR | Status: AC
  Administered 2022-03-11: 40 mg via INTRAVENOUS
  Filled 2022-03-11: qty 1

## 2022-03-11 MED ORDER — ACETAMINOPHEN 325 MG PO TABS
650.0000 mg | ORAL_TABLET | Freq: Once | ORAL | Status: AC
  Administered 2022-03-11: 650 mg via ORAL
  Filled 2022-03-11: qty 2

## 2022-03-11 MED ORDER — SODIUM CHLORIDE 0.9% IV SOLUTION
250.0000 mL | Freq: Once | INTRAVENOUS | Status: AC
  Administered 2022-03-11: 250 mL via INTRAVENOUS

## 2022-03-11 NOTE — Patient Instructions (Signed)
Blood Transfusion, Adult, Care After This sheet gives you information about how to care for yourself after your procedure. Your doctor may also give you more specific instructions. If you have problems or questions, contact your doctor. What can I expect after the procedure? After the procedure, it is common to have: Bruising and soreness at the IV site. A headache. Follow these instructions at home: Insertion site care     Follow instructions from your doctor about how to take care of your insertion site. This is where an IV tube was put into your vein. Make sure you: Wash your hands with soap and water before and after you change your bandage (dressing). If you cannot use soap and water, use hand sanitizer. Change your bandage as told by your doctor. Check your insertion site every day for signs of infection. Check for: Redness, swelling, or pain. Bleeding from the site. Warmth. Pus or a bad smell. General instructions Take over-the-counter and prescription medicines only as told by your doctor. Rest as told by your doctor. Go back to your normal activities as told by your doctor. Keep all follow-up visits as told by your doctor. This is important. Contact a doctor if: You have itching or red, swollen areas of skin (hives). You feel worried or nervous (anxious). You feel weak after doing your normal activities. You have redness, swelling, warmth, or pain around the insertion site. You have blood coming from the insertion site, and the blood does not stop with pressure. You have pus or a bad smell coming from the insertion site. Get help right away if: You have signs of a serious reaction. This may be coming from an allergy or the body's defense system (immune system). Signs include: Trouble breathing or shortness of breath. Swelling of the face or feeling warm (flushed). Fever or chills. Head, chest, or back pain. Dark pee (urine) or blood in the pee. Widespread rash. Fast  heartbeat. Feeling dizzy or light-headed. You may receive your blood transfusion in an outpatient setting. If so, you will be told whom to contact to report any reactions. These symptoms may be an emergency. Do not wait to see if the symptoms will go away. Get medical help right away. Call your local emergency services (911 in the [place] Do not drive yourself to the hospital. Summary Bruising and soreness at the IV site are common. Check your insertion site every day for signs of infection. Rest as told by your doctor. Go back to your normal activities as told by your doctor. Get help right away if you have signs of a serious reaction. This information is not intended to replace advice given to you by your health care provider. Make sure you discuss any questions you have with your health care provider. Document Revised: 01/29/2021 Document Reviewed: 03/29/2019 Elsevier Patient Education  2023 Elsevier Inc.  

## 2022-03-12 LAB — TYPE AND SCREEN
ABO/RH(D): A POS
Antibody Screen: NEGATIVE
Unit division: 0
Unit division: 0

## 2022-03-12 LAB — BPAM RBC
Blood Product Expiration Date: 202306082359
Blood Product Expiration Date: 202306162359
ISSUE DATE / TIME: 202305241414
ISSUE DATE / TIME: 202305250824
Unit Type and Rh: 6200
Unit Type and Rh: 6200

## 2022-03-16 ENCOUNTER — Telehealth: Payer: Self-pay | Admitting: Hematology

## 2022-03-16 NOTE — Telephone Encounter (Signed)
Scheduled per 05/24 los, patient has been called and notified of upcoming appointments.

## 2022-03-17 DIAGNOSIS — D649 Anemia, unspecified: Secondary | ICD-10-CM | POA: Diagnosis not present

## 2022-03-17 DIAGNOSIS — D7589 Other specified diseases of blood and blood-forming organs: Secondary | ICD-10-CM | POA: Diagnosis not present

## 2022-03-22 ENCOUNTER — Encounter (INDEPENDENT_AMBULATORY_CARE_PROVIDER_SITE_OTHER): Payer: Medicare Other | Admitting: Ophthalmology

## 2022-03-24 ENCOUNTER — Other Ambulatory Visit: Payer: Self-pay

## 2022-03-24 DIAGNOSIS — D471 Chronic myeloproliferative disease: Secondary | ICD-10-CM

## 2022-03-25 ENCOUNTER — Other Ambulatory Visit: Payer: Self-pay | Admitting: Lab

## 2022-03-25 ENCOUNTER — Inpatient Hospital Stay: Payer: Medicare Other | Attending: Hematology

## 2022-03-25 ENCOUNTER — Other Ambulatory Visit: Payer: Self-pay

## 2022-03-25 ENCOUNTER — Inpatient Hospital Stay: Payer: Medicare Other

## 2022-03-25 DIAGNOSIS — D473 Essential (hemorrhagic) thrombocythemia: Secondary | ICD-10-CM | POA: Insufficient documentation

## 2022-03-25 DIAGNOSIS — D471 Chronic myeloproliferative disease: Secondary | ICD-10-CM

## 2022-03-25 DIAGNOSIS — Z79899 Other long term (current) drug therapy: Secondary | ICD-10-CM | POA: Diagnosis not present

## 2022-03-25 DIAGNOSIS — D7581 Myelofibrosis: Secondary | ICD-10-CM | POA: Diagnosis present

## 2022-03-25 LAB — CBC WITH DIFFERENTIAL (CANCER CENTER ONLY)
Abs Immature Granulocytes: 0.59 10*3/uL — ABNORMAL HIGH (ref 0.00–0.07)
Basophils Absolute: 0.4 10*3/uL — ABNORMAL HIGH (ref 0.0–0.1)
Basophils Relative: 2 %
Eosinophils Absolute: 1.4 10*3/uL — ABNORMAL HIGH (ref 0.0–0.5)
Eosinophils Relative: 10 %
HCT: 21.1 % — ABNORMAL LOW (ref 39.0–52.0)
Hemoglobin: 7.2 g/dL — ABNORMAL LOW (ref 13.0–17.0)
Immature Granulocytes: 4 %
Lymphocytes Relative: 6 %
Lymphs Abs: 0.8 10*3/uL (ref 0.7–4.0)
MCH: 34.4 pg — ABNORMAL HIGH (ref 26.0–34.0)
MCHC: 34.1 g/dL (ref 30.0–36.0)
MCV: 101 fL — ABNORMAL HIGH (ref 80.0–100.0)
Monocytes Absolute: 0.9 10*3/uL (ref 0.1–1.0)
Monocytes Relative: 6 %
Neutro Abs: 10.4 10*3/uL — ABNORMAL HIGH (ref 1.7–7.7)
Neutrophils Relative %: 72 %
Platelet Count: 662 10*3/uL — ABNORMAL HIGH (ref 150–400)
RBC: 2.09 MIL/uL — ABNORMAL LOW (ref 4.22–5.81)
RDW: 18 % — ABNORMAL HIGH (ref 11.5–15.5)
WBC Count: 14.4 10*3/uL — ABNORMAL HIGH (ref 4.0–10.5)
nRBC: 0 % (ref 0.0–0.2)

## 2022-03-25 LAB — CMP (CANCER CENTER ONLY)
ALT: 14 U/L (ref 0–44)
AST: 11 U/L — ABNORMAL LOW (ref 15–41)
Albumin: 4.2 g/dL (ref 3.5–5.0)
Alkaline Phosphatase: 56 U/L (ref 38–126)
Anion gap: 5 (ref 5–15)
BUN: 20 mg/dL (ref 8–23)
CO2: 27 mmol/L (ref 22–32)
Calcium: 9.4 mg/dL (ref 8.9–10.3)
Chloride: 106 mmol/L (ref 98–111)
Creatinine: 1.13 mg/dL (ref 0.61–1.24)
GFR, Estimated: >60 mL/min (ref >60)
Glucose, Bld: 130 mg/dL — ABNORMAL HIGH (ref 70–99)
Potassium: 5 mmol/L (ref 3.5–5.1)
Sodium: 138 mmol/L (ref 135–145)
Total Bilirubin: 0.6 mg/dL (ref 0.3–1.2)
Total Protein: 6.2 g/dL — ABNORMAL LOW (ref 6.5–8.1)

## 2022-03-25 LAB — SAMPLE TO BLOOD BANK

## 2022-03-25 LAB — PREPARE RBC (CROSSMATCH)

## 2022-03-25 MED ORDER — ACETAMINOPHEN 325 MG PO TABS
650.0000 mg | ORAL_TABLET | Freq: Once | ORAL | Status: AC
  Administered 2022-03-25: 650 mg via ORAL
  Filled 2022-03-25: qty 2

## 2022-03-25 MED ORDER — METHYLPREDNISOLONE SODIUM SUCC 40 MG IJ SOLR
40.0000 mg | Freq: Once | INTRAMUSCULAR | Status: AC
  Administered 2022-03-25: 40 mg via INTRAVENOUS
  Filled 2022-03-25: qty 1

## 2022-03-25 MED ORDER — SODIUM CHLORIDE 0.9% IV SOLUTION
250.0000 mL | Freq: Once | INTRAVENOUS | Status: AC
  Administered 2022-03-25: 250 mL via INTRAVENOUS

## 2022-03-26 ENCOUNTER — Other Ambulatory Visit: Payer: Self-pay | Admitting: Student

## 2022-03-26 DIAGNOSIS — D471 Chronic myeloproliferative disease: Secondary | ICD-10-CM

## 2022-03-26 LAB — TYPE AND SCREEN
ABO/RH(D): A POS
Antibody Screen: NEGATIVE
Unit division: 0

## 2022-03-26 LAB — BPAM RBC
Blood Product Expiration Date: 202306282359
ISSUE DATE / TIME: 202306081225
Unit Type and Rh: 6200

## 2022-03-27 NOTE — H&P (Signed)
Referring Physician(s): Brunetta Genera  Supervising Physician: Jacqulynn Cadet  Patient Status:  [facility] OP  Chief Complaint:  "I'm here for another bone marrow biopsy"  Subjective: Pt known to IR service from BM bx on 03/09/21. He has a PMH sig for HLD, GERD, nephrolithiasis, PAC's/VT, thrombocytosis, OSA, prediabetes and JAK2+ MPN. He now has worsening anemia and presents today for CT guided bone marrow biopsy to r/o additional MDS vs progression of myelofibrosis.    Allergies: Patient has no known allergies.  Medications: Prior to Admission medications   Medication Sig Start Date End Date Taking? Authorizing Provider  aspirin EC 81 MG tablet Take 81 mg by mouth daily. Swallow whole.    [provider]  B Complex Vitamins (B COMPLEX PO) Take 1 tablet by mouth daily.    [provider]  Cholecalciferol 50 MCG (2000 UT) TABS Take 2,000 Units by mouth daily.    [provider]  docusate sodium (COLACE) 100 MG capsule Take 100 mg by mouth 2 (two) times daily.    [provider]  FIBER PO Take 1 capsule by mouth daily. Unknown strength    [provider]  fish oil-omega-3 fatty acids 1000 MG capsule Take 1 g by mouth 2 (two) times daily. Strength 364m/1500mg    [provider]  GLUCOSAMINE CHONDROITIN MSM PO Take 1 tablet by mouth daily. Strength 1500/1500    [provider]  hydroxyurea (HYDREA) 500 MG capsule 1 tab (5058m po daily 5 days a week Monday through Friday. May take with food to minimize GI side effects. 11/18/21   KaBrunetta GeneraMD  iron polysaccharides (NIFEREX) 150 MG capsule Take 1 capsule (150 mg total) by mouth daily. 07/17/21   KaBrunetta GeneraMD  Misc Natural Products (PROSTATE SUPPORT PO) Take 1 capsule by mouth daily. Unknown strength    [provider]  Multiple Vitamin (MULTIVITAMIN) tablet Take 1 tablet by mouth daily. Unknown strength    [provider]  psyllium  (METAMUCIL) 58.6 % powder Take 1 packet by mouth daily.    [provider]  Turmeric (QC TUMERIC COMPLEX PO) Take 750 mg by mouth daily. Unknown strenght    [provider]  vitamin C (ASCORBIC ACID) 250 MG tablet Take 500 mg by mouth daily.    [provider]     Vital Signs:   Physical Exam  Imaging: No results found.  Labs:  CBC: Recent Labs    02/05/22 1258 02/19/22 1008 03/10/22 1203 03/25/22 1014  WBC 12.5* 11.8* 15.4* 14.4*  HGB 7.7* 6.6* 6.6* 7.2*  HCT 23.0* 19.2* 19.8* 21.1*  PLT 584* 608* 701* 662*    COAGS: No results for input(s): "INR", "APTT" in the last 8760 hours.  BMP: Recent Labs    02/05/22 1258 02/19/22 1008 03/10/22 1203 03/25/22 1014  NA 139 138 138 138  K 4.7 4.7 4.7 5.0  CL 105 107 106 106  CO2 _0 GLUCOSE 155* 177* 106* 130*  BUN _1 CALCIUM 8.7* 8.5* 8.9 9.4  CREATININE 1.07 0.99 1.09 1.13  GFRNONAA >60 >60 >60 >60    LIVER FUNCTION TESTS: Recent Labs    02/05/22 1258 02/19/22 1008 03/10/22 1203 03/25/22 1014  BILITOT 0.4 0.4 0.5 0.6  AST 13* 14* 16 11*  ALT _2 ALKPHOS 53 51 59 56  PROT 6.1* 6.1* 6.5 6.2*  ALBUMIN 4.0 3.9 4.2 4.2    Assessment  and Plan: Pt known to IR service from BM bx on 03/09/21. He has a PMH sig for HLD, GERD, nephrolithiasis, PAC's/VT, thrombocytosis, OSA, prediabetes and JAK2+ MPN. He now has worsening anemia and presents today for CT guided bone marrow biopsy to r/o additional MDS vs progression of myelofibrosis.Risks and benefits of procedure was discussed with the patient including, but not limited to bleeding, infection, damage to adjacent structures or low yield requiring additional tests.  All of the questions were answered and there is agreement to proceed.  Consent signed and in chart.    Electronically Signed: D. Rowe Robert, PA-C 03/27/2022, 3:25 PM   I spent a total of 20 minutes at the the patient's bedside AND on the  patient's hospital floor or unit, greater than 50% of which was counseling/coordinating care for CT guided bone marrow biopsy

## 2022-03-29 ENCOUNTER — Ambulatory Visit (HOSPITAL_COMMUNITY)
Admission: RE | Admit: 2022-03-29 | Discharge: 2022-03-29 | Disposition: A | Discharge status: Home / Self Care | Payer: Medicare Other | Source: Ambulatory Visit | Attending: Hematology | Admitting: Hematology

## 2022-03-29 ENCOUNTER — Encounter (HOSPITAL_COMMUNITY): Payer: Self-pay

## 2022-03-29 DIAGNOSIS — R7303 Prediabetes: Secondary | ICD-10-CM | POA: Insufficient documentation

## 2022-03-29 DIAGNOSIS — G4733 Obstructive sleep apnea (adult) (pediatric): Secondary | ICD-10-CM | POA: Insufficient documentation

## 2022-03-29 DIAGNOSIS — D75839 Thrombocytosis, unspecified: Secondary | ICD-10-CM | POA: Diagnosis not present

## 2022-03-29 DIAGNOSIS — K219 Gastro-esophageal reflux disease without esophagitis: Secondary | ICD-10-CM | POA: Insufficient documentation

## 2022-03-29 DIAGNOSIS — Z87442 Personal history of urinary calculi: Secondary | ICD-10-CM | POA: Insufficient documentation

## 2022-03-29 DIAGNOSIS — Z1589 Genetic susceptibility to other disease: Secondary | ICD-10-CM

## 2022-03-29 DIAGNOSIS — D469 Myelodysplastic syndrome, unspecified: Secondary | ICD-10-CM | POA: Diagnosis not present

## 2022-03-29 DIAGNOSIS — E785 Hyperlipidemia, unspecified: Secondary | ICD-10-CM | POA: Insufficient documentation

## 2022-03-29 DIAGNOSIS — D471 Chronic myeloproliferative disease: Secondary | ICD-10-CM

## 2022-03-29 DIAGNOSIS — D539 Nutritional anemia, unspecified: Secondary | ICD-10-CM | POA: Diagnosis not present

## 2022-03-29 DIAGNOSIS — D649 Anemia, unspecified: Secondary | ICD-10-CM | POA: Diagnosis not present

## 2022-03-29 DIAGNOSIS — D72829 Elevated white blood cell count, unspecified: Secondary | ICD-10-CM | POA: Diagnosis not present

## 2022-03-29 LAB — CBC WITH DIFFERENTIAL/PLATELET
Abs Immature Granulocytes: 0.89 10*3/uL — ABNORMAL HIGH (ref 0.00–0.07)
Basophils Absolute: 0.9 10*3/uL — ABNORMAL HIGH (ref 0.0–0.1)
Basophils Relative: 5 %
Eosinophils Absolute: 2 10*3/uL — ABNORMAL HIGH (ref 0.0–0.5)
Eosinophils Relative: 11 %
HCT: 27 % — ABNORMAL LOW (ref 39.0–52.0)
Hemoglobin: 8.8 g/dL — ABNORMAL LOW (ref 13.0–17.0)
Immature Granulocytes: 5 %
Lymphocytes Relative: 7 %
Lymphs Abs: 1.2 10*3/uL (ref 0.7–4.0)
MCH: 33.1 pg (ref 26.0–34.0)
MCHC: 32.6 g/dL (ref 30.0–36.0)
MCV: 101.5 fL — ABNORMAL HIGH (ref 80.0–100.0)
Monocytes Absolute: 1.3 10*3/uL — ABNORMAL HIGH (ref 0.1–1.0)
Monocytes Relative: 7 %
Neutro Abs: 12.5 10*3/uL — ABNORMAL HIGH (ref 1.7–7.7)
Neutrophils Relative %: 65 %
Platelets: 770 10*3/uL — ABNORMAL HIGH (ref 150–400)
RBC: 2.66 MIL/uL — ABNORMAL LOW (ref 4.22–5.81)
RDW: 17.8 % — ABNORMAL HIGH (ref 11.5–15.5)
WBC: 18.8 10*3/uL — ABNORMAL HIGH (ref 4.0–10.5)
nRBC: 0 % (ref 0.0–0.2)

## 2022-03-29 LAB — GLUCOSE, CAPILLARY: Glucose-Capillary: 116 mg/dL — ABNORMAL HIGH (ref 70–99)

## 2022-03-29 MED ORDER — MIDAZOLAM HCL 2 MG/2ML IJ SOLN
INTRAMUSCULAR | Status: AC | PRN
  Administered 2022-03-29 (×2): 1 mg via INTRAVENOUS

## 2022-03-29 MED ORDER — FENTANYL CITRATE (PF) 100 MCG/2ML IJ SOLN
INTRAMUSCULAR | Status: AC
  Filled 2022-03-29: qty 2

## 2022-03-29 MED ORDER — FENTANYL CITRATE (PF) 100 MCG/2ML IJ SOLN
INTRAMUSCULAR | Status: AC | PRN
  Administered 2022-03-29 (×2): 50 ug via INTRAVENOUS

## 2022-03-29 MED ORDER — LIDOCAINE HCL (PF) 1 % IJ SOLN
INTRAMUSCULAR | Status: AC | PRN
  Administered 2022-03-29: 15 mL

## 2022-03-29 MED ORDER — MIDAZOLAM HCL 2 MG/2ML IJ SOLN
INTRAMUSCULAR | Status: AC
  Filled 2022-03-29: qty 4

## 2022-03-29 MED ORDER — SODIUM CHLORIDE 0.9 % IV SOLN: INTRAVENOUS | Status: DC

## 2022-03-29 NOTE — Procedures (Signed)
Interventional Radiology Procedure Note  Procedure: CT guided aspirate and core biopsy of right iliac bone Complications: None Recommendations: - Bedrest supine x 1 hrs - Hydrocodone PRN  Pain - Follow biopsy results  Signed,  Randilyn Foisy K. Kingstyn Deruiter, MD   

## 2022-03-29 NOTE — Discharge Instructions (Signed)
For questions /concerns may call Interventional Radiology at [phone] ° °You may remove your dressing and shower tomorrow afternoon ° ° ° °Bone Marrow Aspiration and Bone Marrow Biopsy, Adult, Care After °This sheet gives you information about how to care for yourself after your procedure. Your health care provider may also give you more specific instructions. If you have problems or questions, contact your health careprovider. °What can I expect after the procedure? °After the procedure, it is common to have: °Mild pain and tenderness. °Swelling. °Bruising. °Follow these instructions at home: °Puncture site care °Follow instructions from your health care provider about how to take care of the puncture site. Make sure you: °Wash your hands with soap and water before and after you change your bandage (dressing). If soap and water are not available, use hand sanitizer. °Change your dressing as told by your health care provider. °Check your puncture site every day for signs of infection. Check for: °More redness, swelling, or pain. °Fluid or blood. °Warmth. °Pus or a bad smell. ° °Activity °Return to your normal activities as told by your health care provider. Ask your health care provider what activities are safe for you. °Do not lift anything that is heavier than 10 lb (4.5 kg), or the limit that you are told, until your health care provider says that it is safe. °Do not drive for 24 hours if you were given a sedative during your procedure. °General instructions °Take over-the-counter and prescription medicines only as told by your health care provider. °Do not take baths, swim, or use a hot tub until your health care provider approves. Ask your health care provider if you may take showers. You may only be allowed to take sponge baths. °If directed, put ice on the affected area. To do this: °Put ice in a plastic bag. °Place a towel between your skin and the bag. °Leave the ice on for 20 minutes, 2-3 times a  day. °Keep all follow-up visits as told by your health care provider. This is important. ° °Contact a health care provider if: °Your pain is not controlled with medicine. °You have a fever. °You have more redness, swelling, or pain around the puncture site. °You have fluid or blood coming from the puncture site. °Your puncture site feels warm to the touch. °You have pus or a bad smell coming from the puncture site. °Summary °After the procedure, it is common to have mild pain, tenderness, swelling, and bruising. °Follow instructions from your health care provider about how to take care of the puncture site and what activities are safe for you. °Take over-the-counter and prescription medicines only as told by your health care provider. °Contact a health care provider if you have any signs of infection, such as fluid or blood coming from the puncture site. °This information is not intended to replace advice given to you by your health care provider. Make sure you discuss any questions you have with your healthcare provider. ° ° °Moderate Conscious Sedation, Adult, Care After °This sheet gives you information about how to care for yourself after your procedure. Your health care provider may also give you more specific instructions. If you have problems or questions, contact your health careprovider. °What can I expect after the procedure? °After the procedure, it is common to have: °Sleepiness for several hours. °Impaired judgment for several hours. °Difficulty with balance. °Vomiting if you eat too soon. °Follow these instructions at home: °For the time period you were told by your health care provider: °  Rest. °Do not participate in activities where you could fall or become injured. °Do not drive or use machinery. °Do not drink alcohol. °Do not take sleeping pills or medicines that cause drowsiness. °Do not make important decisions or sign legal documents. °Do not take care of children on your own. °Eating and  drinking ° °Follow the diet recommended by your health care provider. °Drink enough fluid to keep your urine pale yellow. °If you vomit: °Drink water, juice, or soup when you can drink without vomiting. °Make sure you have little or no nausea before eating solid foods. ° °General instructions °Take over-the-counter and prescription medicines only as told by your health care provider. °Have a responsible adult stay with you for the time you are told. It is important to have someone help care for you until you are awake and alert. °Do not smoke. °Keep all follow-up visits as told by your health care provider. This is important. °Contact a health care provider if: °You are still sleepy or having trouble with balance after 24 hours. °You feel light-headed. °You keep feeling nauseous or you keep vomiting. °You develop a rash. °You have a fever. °You have redness or swelling around the IV site. °Get help right away if: °You have trouble breathing. °You have new-onset confusion at home. °Summary °After the procedure, it is common to feel sleepy, have impaired judgment, or feel nauseous if you eat too soon. °Rest after you get home. Know the things you should not do after the procedure. °Follow the diet recommended by your health care provider and drink enough fluid to keep your urine pale yellow. °Get help right away if you have trouble breathing or new-onset confusion at home. °This information is not intended to replace advice given to you by your health care provider. Make sure you discuss any questions you have with your healthcare provider. °Document Revised: 02/01/2020 Document Reviewed: 08/30/2019 °Elsevier Patient Education © 2022 [employer]  °

## 2022-03-31 LAB — SURGICAL PATHOLOGY

## 2022-04-06 ENCOUNTER — Encounter (HOSPITAL_COMMUNITY): Payer: Self-pay | Admitting: Hematology

## 2022-04-06 ENCOUNTER — Other Ambulatory Visit: Payer: Self-pay | Admitting: *Deleted

## 2022-04-06 DIAGNOSIS — Z0000 Encounter for general adult medical examination without abnormal findings: Secondary | ICD-10-CM | POA: Diagnosis not present

## 2022-04-06 DIAGNOSIS — D649 Anemia, unspecified: Secondary | ICD-10-CM

## 2022-04-06 DIAGNOSIS — D471 Chronic myeloproliferative disease: Secondary | ICD-10-CM

## 2022-04-07 ENCOUNTER — Encounter (HOSPITAL_COMMUNITY): Payer: Self-pay | Admitting: Hematology

## 2022-04-07 ENCOUNTER — Inpatient Hospital Stay (HOSPITAL_BASED_OUTPATIENT_CLINIC_OR_DEPARTMENT_OTHER): Payer: Medicare Other | Admitting: Hematology

## 2022-04-07 ENCOUNTER — Other Ambulatory Visit: Payer: Self-pay

## 2022-04-07 ENCOUNTER — Inpatient Hospital Stay: Payer: Medicare Other

## 2022-04-07 VITALS — BP 113/57 | HR 71 | Temp 98.1°F | Resp 20 | Wt 171.7 lb

## 2022-04-07 DIAGNOSIS — D471 Chronic myeloproliferative disease: Secondary | ICD-10-CM | POA: Diagnosis not present

## 2022-04-07 DIAGNOSIS — Z1589 Genetic susceptibility to other disease: Secondary | ICD-10-CM

## 2022-04-07 DIAGNOSIS — D473 Essential (hemorrhagic) thrombocythemia: Secondary | ICD-10-CM | POA: Diagnosis not present

## 2022-04-07 DIAGNOSIS — Z79899 Other long term (current) drug therapy: Secondary | ICD-10-CM | POA: Diagnosis not present

## 2022-04-07 LAB — CMP (CANCER CENTER ONLY)
ALT: 13 U/L (ref 0–44)
AST: 11 U/L — ABNORMAL LOW (ref 15–41)
Albumin: 4.1 g/dL (ref 3.5–5.0)
Alkaline Phosphatase: 54 U/L (ref 38–126)
Anion gap: 4 — ABNORMAL LOW (ref 5–15)
BUN: 23 mg/dL (ref 8–23)
CO2: 28 mmol/L (ref 22–32)
Calcium: 9.2 mg/dL (ref 8.9–10.3)
Chloride: 106 mmol/L (ref 98–111)
Creatinine: 1.09 mg/dL (ref 0.61–1.24)
GFR, Estimated: >60 mL/min (ref >60)
Glucose, Bld: 181 mg/dL — ABNORMAL HIGH (ref 70–99)
Potassium: 4.5 mmol/L (ref 3.5–5.1)
Sodium: 138 mmol/L (ref 135–145)
Total Bilirubin: 0.6 mg/dL (ref 0.3–1.2)
Total Protein: 6.3 g/dL — ABNORMAL LOW (ref 6.5–8.1)

## 2022-04-07 LAB — CBC WITH DIFFERENTIAL (CANCER CENTER ONLY)
Abs Immature Granulocytes: 0.64 10*3/uL — ABNORMAL HIGH (ref 0.00–0.07)
Basophils Absolute: 0.3 10*3/uL — ABNORMAL HIGH (ref 0.0–0.1)
Basophils Relative: 3 %
Eosinophils Absolute: 1.3 10*3/uL — ABNORMAL HIGH (ref 0.0–0.5)
Eosinophils Relative: 10 %
HCT: 21.2 % — ABNORMAL LOW (ref 39.0–52.0)
Hemoglobin: 7.1 g/dL — ABNORMAL LOW (ref 13.0–17.0)
Immature Granulocytes: 5 %
Lymphocytes Relative: 6 %
Lymphs Abs: 0.8 10*3/uL (ref 0.7–4.0)
MCH: 33.5 pg (ref 26.0–34.0)
MCHC: 33.5 g/dL (ref 30.0–36.0)
MCV: 100 fL (ref 80.0–100.0)
Monocytes Absolute: 0.8 10*3/uL (ref 0.1–1.0)
Monocytes Relative: 6 %
Neutro Abs: 9.4 10*3/uL — ABNORMAL HIGH (ref 1.7–7.7)
Neutrophils Relative %: 70 %
Platelet Count: 710 10*3/uL — ABNORMAL HIGH (ref 150–400)
RBC: 2.12 MIL/uL — ABNORMAL LOW (ref 4.22–5.81)
RDW: 17.9 % — ABNORMAL HIGH (ref 11.5–15.5)
WBC Count: 13.2 10*3/uL — ABNORMAL HIGH (ref 4.0–10.5)
nRBC: 0 % (ref 0.0–0.2)

## 2022-04-07 LAB — SAMPLE TO BLOOD BANK

## 2022-04-07 LAB — PREPARE RBC (CROSSMATCH)

## 2022-04-07 MED ORDER — ACETAMINOPHEN 325 MG PO TABS
650.0000 mg | ORAL_TABLET | Freq: Once | ORAL | Status: AC
  Administered 2022-04-07: 650 mg via ORAL
  Filled 2022-04-07: qty 2

## 2022-04-07 MED ORDER — METHYLPREDNISOLONE SODIUM SUCC 40 MG IJ SOLR
40.0000 mg | Freq: Once | INTRAMUSCULAR | Status: AC
  Administered 2022-04-07: 40 mg via INTRAVENOUS
  Filled 2022-04-07: qty 1

## 2022-04-07 MED ORDER — SODIUM CHLORIDE 0.9% IV SOLUTION
250.0000 mL | Freq: Once | INTRAVENOUS | Status: AC
  Administered 2022-04-07: 250 mL via INTRAVENOUS

## 2022-04-07 NOTE — Patient Instructions (Signed)
Blood Transfusion, Adult, Care After This sheet gives you information about how to care for yourself after your procedure. Your doctor may also give you more specific instructions. If you have problems or questions, contact your doctor. What can I expect after the procedure? After the procedure, it is common to have: Bruising and soreness at the IV site. A headache. Follow these instructions at home: Insertion site care     Follow instructions from your doctor about how to take care of your insertion site. This is where an IV tube was put into your vein. Make sure you: Wash your hands with soap and water before and after you change your bandage (dressing). If you cannot use soap and water, use hand sanitizer. Change your bandage as told by your doctor. Check your insertion site every day for signs of infection. Check for: Redness, swelling, or pain. Bleeding from the site. Warmth. Pus or a bad smell. General instructions Take over-the-counter and prescription medicines only as told by your doctor. Rest as told by your doctor. Go back to your normal activities as told by your doctor. Keep all follow-up visits as told by your doctor. This is important. Contact a doctor if: You have itching or red, swollen areas of skin (hives). You feel worried or nervous (anxious). You feel weak after doing your normal activities. You have redness, swelling, warmth, or pain around the insertion site. You have blood coming from the insertion site, and the blood does not stop with pressure. You have pus or a bad smell coming from the insertion site. Get help right away if: You have signs of a serious reaction. This may be coming from an allergy or the body's defense system (immune system). Signs include: Trouble breathing or shortness of breath. Swelling of the face or feeling warm (flushed). Fever or chills. Head, chest, or back pain. Dark pee (urine) or blood in the pee. Widespread rash. Fast  heartbeat. Feeling dizzy or light-headed. You may receive your blood transfusion in an outpatient setting. If so, you will be told whom to contact to report any reactions. These symptoms may be an emergency. Do not wait to see if the symptoms will go away. Get medical help right away. Call your local emergency services (911 in the [place] Do not drive yourself to the hospital. Summary Bruising and soreness at the IV site are common. Check your insertion site every day for signs of infection. Rest as told by your doctor. Go back to your normal activities as told by your doctor. Get help right away if you have signs of a serious reaction. This information is not intended to replace advice given to you by your health care provider. Make sure you discuss any questions you have with your health care provider. Document Revised: 01/29/2021 Document Reviewed: 03/29/2019 Elsevier Patient Education  2023 Elsevier Inc.  

## 2022-04-08 LAB — TYPE AND SCREEN
ABO/RH(D): A POS
Antibody Screen: NEGATIVE
Unit division: 0

## 2022-04-08 LAB — BPAM RBC
Blood Product Expiration Date: 202307082359
ISSUE DATE / TIME: 202306211302
Unit Type and Rh: 6200

## 2022-04-09 ENCOUNTER — Telehealth: Payer: Self-pay | Admitting: Hematology

## 2022-04-14 NOTE — Addendum Note (Signed)
Addended by: Sullivan Lone on: 04/14/2022 02:08 AM   Modules accepted: Orders

## 2022-04-14 NOTE — Progress Notes (Addendum)
HEMATOLOGY/ONCOLOGY PROGRESS NOTE:   Date of Service: 04/14/2022   Patient Care Team: Vernie Shanks, MD as PCP - General (Family Medicine)  CHIEF COMPLAINTS:  Follow-up for continued evaluation and management of JAK2 positive myeloproliferative neoplasm/MDS  INTERVAL HISTORY:  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  He is accompanied by his wife.  He had an uneventful bone marrow biopsy done recently and is here for discussion of the results of his bone marrow biopsy.  He is still requiring PRBC transfusions as needed. We discussed his bone marrow biopsy lab results in details.  He would like to get additional opinion and we discussed referring him to Dr. Terrace Arabia at [facility] for additional input.  No infection issues since his last clinic visit.  No abnormal bleeding or bruising.  No significant new fatigue.  MEDICAL HISTORY:  Past Medical History:  Diagnosis Date   Arthritis of right knee    Cervicalgia    Chest discomfort    normal stress echo   Chest pain    Colon polyps    Dyslipidemia    Fluttering sensation of heart    GERD without esophagitis    Hematochezia    Hyperglycemia    Hyperlipidemia    Internal hemorrhoids    Kidney stones    Male erectile dysfunction, unspecified    Mixed dyslipidemia    Mixed hyperlipidemia    Ocular migraine    PAC (premature atrial contraction)    Personal history of colonic polyps    Prediabetes    Residual hemorrhoidal skin tags    Unspecified hemorrhoids     SURGICAL HISTORY: Past Surgical History:  Procedure Laterality Date   BONE MARROW BIOPSY     KIDNEY STONE SURGERY     retrieval    SOCIAL HISTORY: Social History   Socioeconomic History   Marital status: Married    Spouse name: Not on file   Number of children: Not on file   Years of education: Not on file   Highest education level: Not on file  Occupational History   Not on file   Tobacco Use   Smoking status: Former    Types: Cigarettes   Smokeless tobacco: Never  Substance and Sexual Activity   Alcohol use: Yes    Alcohol/week: 1.0 standard drink of alcohol    Types: 1 Cans of beer per week   Drug use: Not on file   Sexual activity: Not on file  Other Topics Concern   Not on file  Social History Narrative   Not on file   Social Determinants of Health   Financial Resource Strain: Not on file  Food Insecurity: Not on file  Transportation Needs: Not on file  Physical Activity: Not on file  Stress: Not on file  Social Connections: Not on file  Intimate Partner Violence: Not on file    FAMILY HISTORY: Family History  Problem Relation Age of Onset   Stroke Brother    Congestive Heart Failure Brother     ALLERGIES:  has No Known Allergies.  MEDICATIONS:  Current Outpatient Medications  Medication Sig Dispense Refill   aspirin EC 81 MG tablet Take 81 mg by mouth daily. Swallow whole.     B Complex Vitamins (B COMPLEX PO) Take 1 tablet by mouth daily.     Cholecalciferol 50 MCG (2000 UT) TABS Take 2,000 Units by mouth daily.  docusate sodium (COLACE) 100 MG capsule Take 100 mg by mouth 2 (two) times daily.     FIBER PO Take 1 capsule by mouth daily. Unknown strength     fish oil-omega-3 fatty acids 1000 MG capsule Take 1 g by mouth 2 (two) times daily. Strength 323m/1500mg     GLUCOSAMINE CHONDROITIN MSM PO Take 1 tablet by mouth daily. Strength 1500/1500     Misc Natural Products (PROSTATE SUPPORT PO) Take 1 capsule by mouth daily. Unknown strength     Multiple Vitamin (MULTIVITAMIN) tablet Take 1 tablet by mouth daily. Unknown strength     psyllium (METAMUCIL) 58.6 % powder Take 1 packet by mouth daily.     Turmeric (QC TUMERIC COMPLEX PO) Take 750 mg by mouth daily. Unknown strenght     vitamin C (ASCORBIC ACID) 250 MG tablet Take 500 mg by mouth daily.     hydroxyurea (HYDREA) 500 MG capsule 1 tab (5053m po daily 5 days a week Monday  through Friday. May take with food to minimize GI side effects. (Patient not taking: Reported on 04/07/2022) 60 capsule 2   iron polysaccharides (NIFEREX) 150 MG capsule Take 1 capsule (150 mg total) by mouth daily. (Patient not taking: Reported on 04/07/2022) 30 capsule 2   No current facility-administered medications for this visit.    REVIEW OF SYSTEMS:    10 Point review of Systems was done is negative except as noted above.   PHYSICAL EXAMINATION: .BP (!) 113/57   Pulse 71   Temp 98.1 F (36.7 C)   Resp 20   Wt 171 lb 11.2 oz (77.9 kg)   SpO2 99%   BMI 26.89 kg/m  NAD GENERAL:alert, in no acute distress and comfortable SKIN: no acute rashes, no significant lesions EYES: conjunctiva are pink and non-injected, sclera anicteric OROPHARYNX: MMM, no exudates, no oropharyngeal erythema or ulceration NECK: supple, no JVD LYMPH:  no palpable lymphadenopathy in the cervical, axillary or inguinal regions LUNGS: clear to auscultation b/l with normal respiratory effort HEART: regular rate & rhythm ABDOMEN:  normoactive bowel sounds , non tender, not distended. Extremity: no pedal edema PSYCH: alert & oriented x 3 with fluent speech NEURO: no focal motor/sensory deficits   LABORATORY DATA:  I have reviewed the data as listed  .    Latest Ref Rng & Units 04/07/2022    9:54 AM 03/29/2022    9:19 AM 03/25/2022   10:14 AM  CBC  WBC 4.0 - 10.5 K/uL 13.2  18.8  14.4   Hemoglobin 13.0 - 17.0 g/dL 7.1  8.8  7.2   Hematocrit 39.0 - 52.0 % 21.2  27.0  21.1   Platelets 150 - 400 K/uL 710  770  662     .    Latest Ref Rng & Units 04/07/2022    9:54 AM 03/25/2022   10:14 AM 03/10/2022   12:03 PM  CMP  Glucose 70 - 99 mg/dL 181  130  106   BUN 8 - 23 mg/dL _0 Creatinine 0.61 - 1.24 mg/dL 1.09  1.13  1.09   Sodium 135 - 145 mmol/L 138  138  138   Potassium 3.5 - 5.1 mmol/L 4.5  5.0  4.7   Chloride 98 - 111 mmol/L 106  106  106   CO2 22 - 32 mmol/L _1 Calcium 8.9 -  10.3 mg/dL 9.2  9.4  8.9   Total Protein 6.5 - 8.1  g/dL 6.3  6.2  6.5   Total Bilirubin 0.3 - 1.2 mg/dL 0.6  0.6  0.5   Alkaline Phos 38 - 126 U/L 54  56  59   AST 15 - 41 U/L _0 ALT 0 - 44 U/L _1 . Lab Results  Component Value Date   LDH 110 01/04/2022    02/23/2021 BCR ABL    02/23/2021 JAK2   Surgical Pathology  CASE: [id number]  PATIENT: Lyndal Rance  Bone Marrow Report      Clinical History: MPN with progressive anemia  (BH)      DIAGNOSIS:   BONE MARROW, ASPIRATE, CLOT, CORE:  -Hypercellular bone marrow with features of myeloid neoplasm  -See comment   PERIPHERAL BLOOD:  -Macrocytic anemia  -Leukocytosis  -Thrombocytosis   COMMENT:   The bone marrow/peripheral blood show persistent involvement by  previously known myeloid neoplasm.  There is a myeloproliferative  component as supported by previous JAK2 positivity.  However, there are  also dyspoietic changes primarily involving the megakaryocytic cell line  with numerous hypolobated/unilobated forms in addition to  dysgranulopoiesis to a lesser extent.  This is associated with  eosinophilia.  It is not entirely clear whether the overall findings  represent a myeloproliferative neoplasm with treatment related changes  or represent a primary myeloproliferative/myelodysplastic neoplasm  including but not limited to myeloid neoplasms with eosinophilia.  Correlation with cytogenetic and FISH studies strongly recommended.      RADIOGRAPHIC STUDIES: I have personally reviewed the radiological images as listed and agreed with the findings in the report. CT BONE MARROW BIOPSY & ASPIRATION  Result Date: 03/29/2022 INDICATION: 76 year old male with a history of myelodysplastic syndrome and progressive anemia. He presents for repeat bone marrow biopsy to assess for disease progression. EXAM: CT GUIDED BONE MARROW ASPIRATION AND CORE BIOPSY Interventional Radiologist:  Criselda Peaches, MD MEDICATIONS: None. ANESTHESIA/SEDATION: Moderate (conscious) sedation was employed during this procedure. A total of 2 milligrams versed and 100 micrograms fentanyl were administered intravenously. The patient's level of consciousness and vital signs were monitored continuously by radiology nursing throughout the procedure under my direct supervision. Total monitored sedation time: 10 minutes FLUOROSCOPY: None. COMPLICATIONS: None immediate. Estimated blood loss: <25 mL PROCEDURE: Informed written consent was obtained from the patient after a thorough discussion of the procedural risks, benefits and alternatives. All questions were addressed. Maximal Sterile Barrier Technique was utilized including caps, mask, sterile gowns, sterile gloves, sterile drape, hand hygiene and skin antiseptic. A timeout was performed prior to the initiation of the procedure. The patient was positioned prone and non-contrast localization CT was performed of the pelvis to demonstrate the iliac marrow spaces. Maximal barrier sterile technique utilized including caps, mask, sterile gowns, sterile gloves, large sterile drape, hand hygiene, and betadine prep. Under sterile conditions and local anesthesia, an 11 gauge coaxial bone biopsy needle was advanced into the right iliac marrow space. Needle position was confirmed with CT imaging. Initially, bone marrow aspiration was performed. Next, the 11 gauge outer cannula was utilized to obtain a right iliac bone marrow core biopsy. Needle was removed. Hemostasis was obtained with compression. The patient tolerated the procedure well. Samples were prepared with the cytotechnologist. IMPRESSION: Technically successful right iliac bone marrow aspirate and core biopsy. Electronically Signed   By: Jacqulynn Cadet M.D.   On: 03/29/2022 16:40   CT Biopsy  Result Date: 03/29/2022 INDICATION: 76 year old male with a history of myelodysplastic  syndrome and progressive anemia. He  presents for repeat bone marrow biopsy to assess for disease progression. EXAM: CT GUIDED BONE MARROW ASPIRATION AND CORE BIOPSY Interventional Radiologist:  Criselda Peaches, MD MEDICATIONS: None. ANESTHESIA/SEDATION: Moderate (conscious) sedation was employed during this procedure. A total of 2 milligrams versed and 100 micrograms fentanyl were administered intravenously. The patient's level of consciousness and vital signs were monitored continuously by radiology nursing throughout the procedure under my direct supervision. Total monitored sedation time: 10 minutes FLUOROSCOPY: None. COMPLICATIONS: None immediate. Estimated blood loss: <25 mL PROCEDURE: Informed written consent was obtained from the patient after a thorough discussion of the procedural risks, benefits and alternatives. All questions were addressed. Maximal Sterile Barrier Technique was utilized including caps, mask, sterile gowns, sterile gloves, sterile drape, hand hygiene and skin antiseptic. A timeout was performed prior to the initiation of the procedure. The patient was positioned prone and non-contrast localization CT was performed of the pelvis to demonstrate the iliac marrow spaces. Maximal barrier sterile technique utilized including caps, mask, sterile gowns, sterile gloves, large sterile drape, hand hygiene, and betadine prep. Under sterile conditions and local anesthesia, an 11 gauge coaxial bone biopsy needle was advanced into the right iliac marrow space. Needle position was confirmed with CT imaging. Initially, bone marrow aspiration was performed. Next, the 11 gauge outer cannula was utilized to obtain a right iliac bone marrow core biopsy. Needle was removed. Hemostasis was obtained with compression. The patient tolerated the procedure well. Samples were prepared with the cytotechnologist. IMPRESSION: Technically successful right iliac bone marrow aspirate and core biopsy. Electronically Signed   By: Jacqulynn Cadet M.D.    On: 03/29/2022 16:40     ASSESSMENT & PLAN:    1)JAK2-positive myeloproliferative neoplasm-primarily presenting with thrombocytosis.  No polycythemia.  Mild leukocytosis.   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 Has grade 1 out of 3 reticulin fibrosis.  Uncertain if this is primary or secondary.  LDH has remained within normal limits.  PLAN -Patient's labs done today were discussed in detail with the patient. CBC shows hemoglobin of 7.1 with platelets of 710k and WBC count of 13.2k with neutrophils of 9.4 and eosinophils of 1.3k Bone marrow biopsy results discussed in detail with the patient.  Does not shows a involved picture suggesting a combination of an MPN and MDS. MDS FISH panel is unrevealing. We will send out additional myeloid panel including FISH for hypereosinophilic syndrome to evaluate for any other targetable mutations. Hydroxyurea continues to be on hold for more than 6 months. Other reasons for cytopenias evaluated Per patient's request we will give him a referral for second opinion with Dr. Dante Gang at [facility]. We discussed possible consideration of hypomethylating agent treatment with Vidaza if no other targetable mutations noted on myeloid NGS panel. Continue PRBC transfusion as needed for hemoglobin less than 7.5 or if symptomatic  Follow-up note: Labs and 2 units of PRBC every 2 weeks x 6 RTC with Dr Irene Limbo in 6 weeks Referral to Dr Jari Sportsman at [facility] for 2nd opinion regarding MDS/MPN    The total time spent in the appointment was 35 minutes*.  All of the patient's questions were answered with apparent satisfaction. The patient knows to call the clinic with any problems, questions or concerns.   Sullivan Lone MD MS AAHIVMS [facility] Hematology/Oncology Physician [facility]  .*Total Encounter Time as defined by the Centers for Medicare and Medicaid Services includes, in  addition to the face-to-face time of a patient visit (documented in the note above) non-face-to-face time: obtaining and reviewing outside history, ordering and reviewing medications, tests or procedures, care coordination (communications with other health care professionals or caregivers) and documentation in the medical record.

## 2022-04-15 ENCOUNTER — Other Ambulatory Visit: Payer: Self-pay

## 2022-04-15 DIAGNOSIS — D471 Chronic myeloproliferative disease: Secondary | ICD-10-CM

## 2022-04-19 ENCOUNTER — Other Ambulatory Visit: Payer: Self-pay

## 2022-04-19 DIAGNOSIS — D471 Chronic myeloproliferative disease: Secondary | ICD-10-CM

## 2022-04-19 LAB — SURGICAL PATHOLOGY

## 2022-04-21 ENCOUNTER — Inpatient Hospital Stay: Payer: Medicare Other | Attending: Hematology

## 2022-04-21 ENCOUNTER — Telehealth: Payer: Self-pay

## 2022-04-21 ENCOUNTER — Other Ambulatory Visit: Payer: Self-pay

## 2022-04-21 ENCOUNTER — Inpatient Hospital Stay: Payer: Medicare Other

## 2022-04-21 DIAGNOSIS — D7581 Myelofibrosis: Secondary | ICD-10-CM | POA: Diagnosis not present

## 2022-04-21 DIAGNOSIS — D473 Essential (hemorrhagic) thrombocythemia: Secondary | ICD-10-CM | POA: Diagnosis not present

## 2022-04-21 DIAGNOSIS — D471 Chronic myeloproliferative disease: Secondary | ICD-10-CM

## 2022-04-21 LAB — CMP (CANCER CENTER ONLY)
ALT: 16 U/L (ref 0–44)
AST: 12 U/L — ABNORMAL LOW (ref 15–41)
Albumin: 4 g/dL (ref 3.5–5.0)
Alkaline Phosphatase: 61 U/L (ref 38–126)
Anion gap: 6 (ref 5–15)
BUN: 23 mg/dL (ref 8–23)
CO2: 25 mmol/L (ref 22–32)
Calcium: 8.9 mg/dL (ref 8.9–10.3)
Chloride: 106 mmol/L (ref 98–111)
Creatinine: 1.11 mg/dL (ref 0.61–1.24)
GFR, Estimated: >60 mL/min (ref >60)
Glucose, Bld: 176 mg/dL — ABNORMAL HIGH (ref 70–99)
Potassium: 4.1 mmol/L (ref 3.5–5.1)
Sodium: 137 mmol/L (ref 135–145)
Total Bilirubin: 0.6 mg/dL (ref 0.3–1.2)
Total Protein: 6.1 g/dL — ABNORMAL LOW (ref 6.5–8.1)

## 2022-04-21 LAB — CBC WITH DIFFERENTIAL (CANCER CENTER ONLY)
Abs Immature Granulocytes: 0.66 10*3/uL — ABNORMAL HIGH (ref 0.00–0.07)
Basophils Absolute: 0.3 10*3/uL — ABNORMAL HIGH (ref 0.0–0.1)
Basophils Relative: 2 %
Eosinophils Absolute: 1.2 10*3/uL — ABNORMAL HIGH (ref 0.0–0.5)
Eosinophils Relative: 8 %
HCT: 19.4 % — ABNORMAL LOW (ref 39.0–52.0)
Hemoglobin: 6.7 g/dL — CL (ref 13.0–17.0)
Immature Granulocytes: 5 %
Lymphocytes Relative: 5 %
Lymphs Abs: 0.8 10*3/uL (ref 0.7–4.0)
MCH: 33.8 pg (ref 26.0–34.0)
MCHC: 34.5 g/dL (ref 30.0–36.0)
MCV: 98 fL (ref 80.0–100.0)
Monocytes Absolute: 0.8 10*3/uL (ref 0.1–1.0)
Monocytes Relative: 6 %
Neutro Abs: 10.6 10*3/uL — ABNORMAL HIGH (ref 1.7–7.7)
Neutrophils Relative %: 74 %
Platelet Count: 724 10*3/uL — ABNORMAL HIGH (ref 150–400)
RBC: 1.98 MIL/uL — ABNORMAL LOW (ref 4.22–5.81)
RDW: 17.8 % — ABNORMAL HIGH (ref 11.5–15.5)
WBC Count: 14.3 10*3/uL — ABNORMAL HIGH (ref 4.0–10.5)
nRBC: 0 % (ref 0.0–0.2)

## 2022-04-21 LAB — PREPARE RBC (CROSSMATCH)

## 2022-04-21 LAB — SAMPLE TO BLOOD BANK

## 2022-04-21 MED ORDER — SODIUM CHLORIDE 0.9% IV SOLUTION
250.0000 mL | Freq: Once | INTRAVENOUS | Status: AC
  Administered 2022-04-21: 250 mL via INTRAVENOUS

## 2022-04-21 MED ORDER — ACETAMINOPHEN 325 MG PO TABS
650.0000 mg | ORAL_TABLET | Freq: Once | ORAL | Status: AC
  Administered 2022-04-21: 650 mg via ORAL
  Filled 2022-04-21: qty 2

## 2022-04-21 MED ORDER — METHYLPREDNISOLONE SODIUM SUCC 40 MG IJ SOLR
40.0000 mg | Freq: Once | INTRAMUSCULAR | Status: AC
  Administered 2022-04-21: 40 mg via INTRAVENOUS
  Filled 2022-04-21: qty 1

## 2022-04-21 NOTE — Telephone Encounter (Signed)
CRITICAL VALUE STICKER  CRITICAL VALUE:   Hgb 6.7  RECEIVER (on-site recipient of call):  Bonnita Nasuti, [place] NOTIFIED: 10:16   MESSENGER (representative from lab):  Lauren  MD NOTIFIED:   Irene Limbo  TIME OF NOTIFICATION:  10:19

## 2022-04-21 NOTE — Patient Instructions (Signed)
Blood Transfusion, Adult, Care After This sheet gives you information about how to care for yourself after your procedure. Your doctor may also give you more specific instructions. If you have problems or questions, contact your doctor. What can I expect after the procedure? After the procedure, it is common to have: Bruising and soreness at the IV site. A headache. Follow these instructions at home: Insertion site care     Follow instructions from your doctor about how to take care of your insertion site. This is where an IV tube was put into your vein. Make sure you: Wash your hands with soap and water before and after you change your bandage (dressing). If you cannot use soap and water, use hand sanitizer. Change your bandage as told by your doctor. Check your insertion site every day for signs of infection. Check for: Redness, swelling, or pain. Bleeding from the site. Warmth. Pus or a bad smell. General instructions Take over-the-counter and prescription medicines only as told by your doctor. Rest as told by your doctor. Go back to your normal activities as told by your doctor. Keep all follow-up visits as told by your doctor. This is important. Contact a doctor if: You have itching or red, swollen areas of skin (hives). You feel worried or nervous (anxious). You feel weak after doing your normal activities. You have redness, swelling, warmth, or pain around the insertion site. You have blood coming from the insertion site, and the blood does not stop with pressure. You have pus or a bad smell coming from the insertion site. Get help right away if: You have signs of a serious reaction. This may be coming from an allergy or the body's defense system (immune system). Signs include: Trouble breathing or shortness of breath. Swelling of the face or feeling warm (flushed). Fever or chills. Head, chest, or back pain. Dark pee (urine) or blood in the pee. Widespread rash. Fast  heartbeat. Feeling dizzy or light-headed. You may receive your blood transfusion in an outpatient setting. If so, you will be told whom to contact to report any reactions. These symptoms may be an emergency. Do not wait to see if the symptoms will go away. Get medical help right away. Call your local emergency services (911 in the [place] Do not drive yourself to the hospital. Summary Bruising and soreness at the IV site are common. Check your insertion site every day for signs of infection. Rest as told by your doctor. Go back to your normal activities as told by your doctor. Get help right away if you have signs of a serious reaction. This information is not intended to replace advice given to you by your health care provider. Make sure you discuss any questions you have with your health care provider. Document Revised: 01/29/2021 Document Reviewed: 03/29/2019 Elsevier Patient Education  2023 Elsevier Inc.  

## 2022-04-22 LAB — TYPE AND SCREEN
ABO/RH(D): A POS
Antibody Screen: NEGATIVE
Unit division: 0
Unit division: 0

## 2022-04-22 LAB — BPAM RBC
Blood Product Expiration Date: 202307202359
Blood Product Expiration Date: 202307262359
ISSUE DATE / TIME: 202307051204
ISSUE DATE / TIME: 202307051204
Unit Type and Rh: 6200
Unit Type and Rh: 6200

## 2022-05-03 ENCOUNTER — Other Ambulatory Visit: Payer: Self-pay

## 2022-05-03 DIAGNOSIS — D471 Chronic myeloproliferative disease: Secondary | ICD-10-CM

## 2022-05-03 LAB — MPN W/HYPEREOSINOPHILIA FISH

## 2022-05-05 ENCOUNTER — Inpatient Hospital Stay: Payer: Medicare Other

## 2022-05-06 ENCOUNTER — Telehealth: Payer: Self-pay | Admitting: Hematology

## 2022-05-06 ENCOUNTER — Other Ambulatory Visit: Payer: Self-pay

## 2022-05-06 ENCOUNTER — Inpatient Hospital Stay: Payer: Medicare Other

## 2022-05-06 DIAGNOSIS — D7581 Myelofibrosis: Secondary | ICD-10-CM | POA: Diagnosis not present

## 2022-05-06 DIAGNOSIS — D473 Essential (hemorrhagic) thrombocythemia: Secondary | ICD-10-CM | POA: Diagnosis not present

## 2022-05-06 DIAGNOSIS — D471 Chronic myeloproliferative disease: Secondary | ICD-10-CM

## 2022-05-06 LAB — CBC WITH DIFFERENTIAL (CANCER CENTER ONLY)
Abs Immature Granulocytes: 0.91 10*3/uL — ABNORMAL HIGH (ref 0.00–0.07)
Basophils Absolute: 0.4 10*3/uL — ABNORMAL HIGH (ref 0.0–0.1)
Basophils Relative: 3 %
Eosinophils Absolute: 1.1 10*3/uL — ABNORMAL HIGH (ref 0.0–0.5)
Eosinophils Relative: 7 %
HCT: 22.6 % — ABNORMAL LOW (ref 39.0–52.0)
Hemoglobin: 7.7 g/dL — ABNORMAL LOW (ref 13.0–17.0)
Immature Granulocytes: 6 %
Lymphocytes Relative: 5 %
Lymphs Abs: 0.7 10*3/uL (ref 0.7–4.0)
MCH: 32.9 pg (ref 26.0–34.0)
MCHC: 34.1 g/dL (ref 30.0–36.0)
MCV: 96.6 fL (ref 80.0–100.0)
Monocytes Absolute: 1.1 10*3/uL — ABNORMAL HIGH (ref 0.1–1.0)
Monocytes Relative: 7 %
Neutro Abs: 11.3 10*3/uL — ABNORMAL HIGH (ref 1.7–7.7)
Neutrophils Relative %: 72 %
Platelet Count: 695 10*3/uL — ABNORMAL HIGH (ref 150–400)
RBC: 2.34 MIL/uL — ABNORMAL LOW (ref 4.22–5.81)
RDW: 17.4 % — ABNORMAL HIGH (ref 11.5–15.5)
WBC Count: 15.6 10*3/uL — ABNORMAL HIGH (ref 4.0–10.5)
nRBC: 0 % (ref 0.0–0.2)

## 2022-05-06 LAB — CMP (CANCER CENTER ONLY)
ALT: 14 U/L (ref 0–44)
AST: 11 U/L — ABNORMAL LOW (ref 15–41)
Albumin: 3.9 g/dL (ref 3.5–5.0)
Alkaline Phosphatase: 65 U/L (ref 38–126)
Anion gap: 6 (ref 5–15)
BUN: 18 mg/dL (ref 8–23)
CO2: 27 mmol/L (ref 22–32)
Calcium: 8.8 mg/dL — ABNORMAL LOW (ref 8.9–10.3)
Chloride: 104 mmol/L (ref 98–111)
Creatinine: 1.06 mg/dL (ref 0.61–1.24)
GFR, Estimated: >60 mL/min (ref >60)
Glucose, Bld: 161 mg/dL — ABNORMAL HIGH (ref 70–99)
Potassium: 4.2 mmol/L (ref 3.5–5.1)
Sodium: 137 mmol/L (ref 135–145)
Total Bilirubin: 0.5 mg/dL (ref 0.3–1.2)
Total Protein: 5.9 g/dL — ABNORMAL LOW (ref 6.5–8.1)

## 2022-05-06 LAB — SAMPLE TO BLOOD BANK

## 2022-05-06 NOTE — Telephone Encounter (Signed)
Called pt to sch per 7/19 inbasket,pt is aware. Requested to call later for blood appt, desk rn notified

## 2022-05-06 NOTE — Telephone Encounter (Signed)
.  Called patient to schedule appointment per 7/19 inbasket, patient is aware of date and time.   

## 2022-05-10 DIAGNOSIS — D649 Anemia, unspecified: Secondary | ICD-10-CM | POA: Diagnosis not present

## 2022-05-17 ENCOUNTER — Other Ambulatory Visit: Payer: Self-pay | Admitting: *Deleted

## 2022-05-17 DIAGNOSIS — Z87442 Personal history of urinary calculi: Secondary | ICD-10-CM | POA: Diagnosis not present

## 2022-05-17 DIAGNOSIS — K649 Unspecified hemorrhoids: Secondary | ICD-10-CM | POA: Diagnosis not present

## 2022-05-17 DIAGNOSIS — E785 Hyperlipidemia, unspecified: Secondary | ICD-10-CM | POA: Diagnosis not present

## 2022-05-17 DIAGNOSIS — D469 Myelodysplastic syndrome, unspecified: Secondary | ICD-10-CM | POA: Diagnosis not present

## 2022-05-17 DIAGNOSIS — Z79622 Long term (current) use of janus kinase inhibitor: Secondary | ICD-10-CM | POA: Diagnosis not present

## 2022-05-17 DIAGNOSIS — K219 Gastro-esophageal reflux disease without esophagitis: Secondary | ICD-10-CM | POA: Diagnosis not present

## 2022-05-17 DIAGNOSIS — D471 Chronic myeloproliferative disease: Secondary | ICD-10-CM

## 2022-05-17 DIAGNOSIS — M351 Other overlap syndromes: Secondary | ICD-10-CM | POA: Diagnosis not present

## 2022-05-19 ENCOUNTER — Inpatient Hospital Stay: Payer: Medicare Other

## 2022-05-19 ENCOUNTER — Other Ambulatory Visit: Payer: Self-pay

## 2022-05-19 ENCOUNTER — Inpatient Hospital Stay: Payer: Medicare Other | Attending: Hematology | Admitting: Hematology

## 2022-05-19 VITALS — BP 119/58 | HR 70 | Temp 98.8°F | Resp 20 | Wt 168.5 lb

## 2022-05-19 DIAGNOSIS — D72829 Elevated white blood cell count, unspecified: Secondary | ICD-10-CM | POA: Diagnosis not present

## 2022-05-19 DIAGNOSIS — D75839 Thrombocytosis, unspecified: Secondary | ICD-10-CM | POA: Insufficient documentation

## 2022-05-19 DIAGNOSIS — Z86010 Personal history of colonic polyps: Secondary | ICD-10-CM | POA: Insufficient documentation

## 2022-05-19 DIAGNOSIS — D469 Myelodysplastic syndrome, unspecified: Secondary | ICD-10-CM | POA: Diagnosis not present

## 2022-05-19 DIAGNOSIS — E785 Hyperlipidemia, unspecified: Secondary | ICD-10-CM | POA: Diagnosis not present

## 2022-05-19 DIAGNOSIS — M1711 Unilateral primary osteoarthritis, right knee: Secondary | ICD-10-CM | POA: Diagnosis not present

## 2022-05-19 DIAGNOSIS — D539 Nutritional anemia, unspecified: Secondary | ICD-10-CM | POA: Diagnosis not present

## 2022-05-19 DIAGNOSIS — Z8719 Personal history of other diseases of the digestive system: Secondary | ICD-10-CM | POA: Insufficient documentation

## 2022-05-19 DIAGNOSIS — Z87891 Personal history of nicotine dependence: Secondary | ICD-10-CM | POA: Insufficient documentation

## 2022-05-19 DIAGNOSIS — D473 Essential (hemorrhagic) thrombocythemia: Secondary | ICD-10-CM | POA: Diagnosis not present

## 2022-05-19 DIAGNOSIS — D649 Anemia, unspecified: Secondary | ICD-10-CM

## 2022-05-19 DIAGNOSIS — D471 Chronic myeloproliferative disease: Secondary | ICD-10-CM

## 2022-05-19 DIAGNOSIS — R5383 Other fatigue: Secondary | ICD-10-CM | POA: Diagnosis not present

## 2022-05-19 DIAGNOSIS — Z7982 Long term (current) use of aspirin: Secondary | ICD-10-CM | POA: Insufficient documentation

## 2022-05-19 DIAGNOSIS — Z87442 Personal history of urinary calculi: Secondary | ICD-10-CM | POA: Diagnosis not present

## 2022-05-19 DIAGNOSIS — K219 Gastro-esophageal reflux disease without esophagitis: Secondary | ICD-10-CM | POA: Diagnosis not present

## 2022-05-20 ENCOUNTER — Inpatient Hospital Stay: Payer: Medicare Other

## 2022-05-24 ENCOUNTER — Other Ambulatory Visit: Payer: Self-pay

## 2022-05-25 ENCOUNTER — Other Ambulatory Visit: Payer: Self-pay

## 2022-05-25 DIAGNOSIS — D471 Chronic myeloproliferative disease: Secondary | ICD-10-CM

## 2022-05-26 ENCOUNTER — Other Ambulatory Visit: Payer: Self-pay | Admitting: *Deleted

## 2022-05-26 ENCOUNTER — Inpatient Hospital Stay: Payer: Medicare Other

## 2022-05-26 ENCOUNTER — Other Ambulatory Visit: Payer: Self-pay

## 2022-05-26 ENCOUNTER — Encounter: Payer: Self-pay | Admitting: Hematology

## 2022-05-26 DIAGNOSIS — D469 Myelodysplastic syndrome, unspecified: Secondary | ICD-10-CM | POA: Diagnosis not present

## 2022-05-26 DIAGNOSIS — D649 Anemia, unspecified: Secondary | ICD-10-CM | POA: Insufficient documentation

## 2022-05-26 DIAGNOSIS — D471 Chronic myeloproliferative disease: Secondary | ICD-10-CM

## 2022-05-26 LAB — CMP (CANCER CENTER ONLY)
ALT: 15 U/L (ref 0–44)
AST: 11 U/L — ABNORMAL LOW (ref 15–41)
Albumin: 4.1 g/dL (ref 3.5–5.0)
Alkaline Phosphatase: 64 U/L (ref 38–126)
Anion gap: 4 — ABNORMAL LOW (ref 5–15)
BUN: 21 mg/dL (ref 8–23)
CO2: 28 mmol/L (ref 22–32)
Calcium: 8.6 mg/dL — ABNORMAL LOW (ref 8.9–10.3)
Chloride: 104 mmol/L (ref 98–111)
Creatinine: 1.03 mg/dL (ref 0.61–1.24)
GFR, Estimated: >60 mL/min (ref >60)
Glucose, Bld: 136 mg/dL — ABNORMAL HIGH (ref 70–99)
Potassium: 4.5 mmol/L (ref 3.5–5.1)
Sodium: 136 mmol/L (ref 135–145)
Total Bilirubin: 0.5 mg/dL (ref 0.3–1.2)
Total Protein: 6.2 g/dL — ABNORMAL LOW (ref 6.5–8.1)

## 2022-05-26 LAB — CBC WITH DIFFERENTIAL (CANCER CENTER ONLY)
Abs Immature Granulocytes: 1.08 10*3/uL — ABNORMAL HIGH (ref 0.00–0.07)
Basophils Absolute: 0.4 10*3/uL — ABNORMAL HIGH (ref 0.0–0.1)
Basophils Relative: 3 %
Eosinophils Absolute: 1 10*3/uL — ABNORMAL HIGH (ref 0.0–0.5)
Eosinophils Relative: 7 %
HCT: 20.8 % — ABNORMAL LOW (ref 39.0–52.0)
Hemoglobin: 7 g/dL — ABNORMAL LOW (ref 13.0–17.0)
Immature Granulocytes: 7 %
Lymphocytes Relative: 5 %
Lymphs Abs: 0.8 10*3/uL (ref 0.7–4.0)
MCH: 31.5 pg (ref 26.0–34.0)
MCHC: 33.7 g/dL (ref 30.0–36.0)
MCV: 93.7 fL (ref 80.0–100.0)
Monocytes Absolute: 0.9 10*3/uL (ref 0.1–1.0)
Monocytes Relative: 6 %
Neutro Abs: 10.6 10*3/uL — ABNORMAL HIGH (ref 1.7–7.7)
Neutrophils Relative %: 72 %
Platelet Count: 696 10*3/uL — ABNORMAL HIGH (ref 150–400)
RBC: 2.22 MIL/uL — ABNORMAL LOW (ref 4.22–5.81)
RDW: 17.7 % — ABNORMAL HIGH (ref 11.5–15.5)
WBC Count: 14.7 10*3/uL — ABNORMAL HIGH (ref 4.0–10.5)
nRBC: 0 % (ref 0.0–0.2)

## 2022-05-26 LAB — SAMPLE TO BLOOD BANK

## 2022-05-26 LAB — PREPARE RBC (CROSSMATCH)

## 2022-05-26 NOTE — Addendum Note (Signed)
Addended by: Sullivan Lone on: 05/26/2022 01:20 AM   Modules accepted: Orders

## 2022-05-26 NOTE — Progress Notes (Addendum)
HEMATOLOGY/ONCOLOGY PROGRESS NOTE:   Date of Service: 05/26/2022   Patient Care Team: Vernie Shanks, MD (Inactive) as PCP - General (Family Medicine)  CHIEF COMPLAINTS:  Follow-up for continued evaluation and management of JAK2 positive myeloproliferative neoplasm/MDS  INTERVAL HISTORY:  Fernando Lee is here for continued valuation and management of his MPN/MDS.  [facility] for the second hematology opinion and they concurred with our diagnosis.  He was recommended Luspatercept to see if that would help his refractory anemia and also consideration of hypomethylating agents if that did not work. Patient did have transfusion for hemoglobin of 7 at [facility].  Today he has no acute new symptoms.  No lightheadedness or dizziness.  Some element of fatigue.  No new infection issues.  We had a long discussion about treatment options going ahead and he is agreeable with pursuing Luspatercept at this time.  He will be scheduled for continued monitoring and transfusion support as needed. Recent labs done today for his doctors were reviewed in detail with the patient.  MEDICAL HISTORY:  Past Medical History:  Diagnosis Date   Arthritis of right knee    Cervicalgia    Chest discomfort    normal stress echo   Chest pain    Colon polyps    Dyslipidemia    Fluttering sensation of heart    GERD without esophagitis    Hematochezia    Hyperglycemia    Hyperlipidemia    Internal hemorrhoids    Kidney stones    Male erectile dysfunction, unspecified    Mixed dyslipidemia    Mixed hyperlipidemia    Ocular migraine    PAC (premature atrial contraction)    Personal history of colonic polyps    Prediabetes    Residual hemorrhoidal skin tags    Unspecified hemorrhoids     SURGICAL HISTORY: Past Surgical History:  Procedure Laterality Date   BONE MARROW BIOPSY     KIDNEY STONE SURGERY     retrieval    SOCIAL HISTORY: Social History    Socioeconomic History   Marital status: Married    Spouse name: Not on file   Number of children: Not on file   Years of education: Not on file   Highest education level: Not on file  Occupational History   Not on file  Tobacco Use   Smoking status: Former    Types: Cigarettes   Smokeless tobacco: Never  Substance and Sexual Activity   Alcohol use: Yes    Alcohol/week: 1.0 standard drink of alcohol    Types: 1 Cans of beer per week   Drug use: Not on file   Sexual activity: Not on file  Other Topics Concern   Not on file  Social History Narrative   Not on file   Social Determinants of Health   Financial Resource Strain: Not on file  Food Insecurity: Not on file  Transportation Needs: Not on file  Physical Activity: Not on file  Stress: Not on file  Social Connections: Not on file  Intimate Partner Violence: Not on file    FAMILY HISTORY: Family History  Problem Relation Age of Onset   Stroke Brother    Congestive Heart Failure Brother     ALLERGIES:  has No Known Allergies.  MEDICATIONS:  Current Outpatient Medications  Medication Sig Dispense Refill   aspirin EC 81 MG tablet Take 81 mg by mouth daily. Swallow whole.     B Complex  Vitamins (B COMPLEX PO) Take 1 tablet by mouth daily.     Cholecalciferol 50 MCG (2000 UT) TABS Take 2,000 Units by mouth daily.     docusate sodium (COLACE) 100 MG capsule Take 100 mg by mouth 2 (two) times daily.     FIBER PO Take 1 capsule by mouth daily. Unknown strength     fish oil-omega-3 fatty acids 1000 MG capsule Take 1 g by mouth 2 (two) times daily. Strength 348m/1500mg     GLUCOSAMINE CHONDROITIN MSM PO Take 1 tablet by mouth daily. Strength 1500/1500     hydroxyurea (HYDREA) 500 MG capsule 1 tab (5065m po daily 5 days a week Monday through Friday. May take with food to minimize GI side effects. (Patient not taking: Reported on 04/07/2022) 60 capsule 2   iron polysaccharides (NIFEREX) 150 MG capsule Take 1 capsule (150  mg total) by mouth daily. (Patient not taking: Reported on 04/07/2022) 30 capsule 2   Misc Natural Products (PROSTATE SUPPORT PO) Take 1 capsule by mouth daily. Unknown strength     Multiple Vitamin (MULTIVITAMIN) tablet Take 1 tablet by mouth daily. Unknown strength     psyllium (METAMUCIL) 58.6 % powder Take 1 packet by mouth daily.     Turmeric (QC TUMERIC COMPLEX PO) Take 750 mg by mouth daily. Unknown strenght     vitamin C (ASCORBIC ACID) 250 MG tablet Take 500 mg by mouth daily.     No current facility-administered medications for this visit.    REVIEW OF SYSTEMS:    10 Point review of Systems was done is negative except as noted above.   PHYSICAL EXAMINATION: .BP (!) 119/58   Pulse 70   Temp 98.8 F (37.1 C)   Resp 20   Wt 168 lb 8 oz (76.4 kg)   SpO2 99%   BMI 26.39 kg/m  NAD GENERAL:alert, in no acute distress and comfortable SKIN: no acute rashes, no significant lesions EYES: conjunctiva are pink and non-injected, sclera anicteric OROPHARYNX: MMM, no exudates, no oropharyngeal erythema or ulceration NECK: supple, no JVD LYMPH:  no palpable lymphadenopathy in the cervical, axillary or inguinal regions LUNGS: clear to auscultation b/l with normal respiratory effort HEART: regular rate & rhythm ABDOMEN:  normoactive bowel sounds , non tender, not distended. Extremity: no pedal edema PSYCH: alert & oriented x 3 with fluent speech NEURO: no focal motor/sensory deficits    LABORATORY DATA:  I have reviewed the data as listed  .    Latest Ref Rng & Units 05/06/2022    2:00 PM 04/21/2022    9:50 AM 04/07/2022    9:54 AM  CBC  WBC 4.0 - 10.5 K/uL 15.6  14.3  13.2   Hemoglobin 13.0 - 17.0 g/dL 7.7  6.7  7.1   Hematocrit 39.0 - 52.0 % 22.6  19.4  21.2   Platelets 150 - 400 K/uL 695  724  710     .    Latest Ref Rng & Units 05/06/2022    2:00 PM 04/21/2022    9:50 AM 04/07/2022    9:54 AM  CMP  Glucose 70 - 99 mg/dL 161  176  181   BUN 8 - 23 mg/dL '18  23  23    ' Creatinine 0.61 - 1.24 mg/dL 1.06  1.11  1.09   Sodium 135 - 145 mmol/L 137  137  138   Potassium 3.5 - 5.1 mmol/L 4.2  4.1  4.5   Chloride 98 - 111 mmol/L 104  106  106   CO2 22 - 32 mmol/L '27  25  28   ' Calcium 8.9 - 10.3 mg/dL 8.8  8.9  9.2   Total Protein 6.5 - 8.1 g/dL 5.9  6.1  6.3   Total Bilirubin 0.3 - 1.2 mg/dL 0.5  0.6  0.6   Alkaline Phos 38 - 126 U/L 65  61  54   AST 15 - 41 U/L '11  12  11   ' ALT 0 - 44 U/L '14  16  13    ' . Lab Results  Component Value Date   LDH 110 01/04/2022    02/23/2021 BCR ABL    02/23/2021 JAK2   Surgical Pathology  CASE: [id number]  PATIENT: Fernando Lee  Bone Marrow Report      Clinical History: MPN with progressive anemia  (BH)      DIAGNOSIS:   BONE MARROW, ASPIRATE, CLOT, CORE:  -Hypercellular bone marrow with features of myeloid neoplasm  -See comment   PERIPHERAL BLOOD:  -Macrocytic anemia  -Leukocytosis  -Thrombocytosis   COMMENT:   The bone marrow/peripheral blood show persistent involvement by  previously known myeloid neoplasm.  There is a myeloproliferative  component as supported by previous JAK2 positivity.  However, there are  also dyspoietic changes primarily involving the megakaryocytic cell line  with numerous hypolobated/unilobated forms in addition to  dysgranulopoiesis to a lesser extent.  This is associated with  eosinophilia.  It is not entirely clear whether the overall findings  represent a myeloproliferative neoplasm with treatment related changes  or represent a primary myeloproliferative/myelodysplastic neoplasm  including but not limited to myeloid neoplasms with eosinophilia.  Correlation with cytogenetic and FISH studies strongly recommended.      RADIOGRAPHIC STUDIES: I have personally reviewed the radiological images as listed and agreed with the findings in the report. No results found.   ASSESSMENT & PLAN:    1)JAK2-positive myeloproliferative neoplasm-primarily presenting  with thrombocytosis.  With associated MDS causing refractory anemia.  No polycythemia.  Mild leukocytosis.   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 Has grade 1 out of 3 reticulin fibrosis.  Uncertain if this is primary or secondary.  LDH has remained within normal limits.  PLAN -Patient's recent records from [facility] were reviewed and discussed in detail with the patient. -The diagnosis of JAK2 positive MPN/MDS was confirmed. -His recent labs recorded vitals were reviewed and the fact that he received 2 units of PRBC support. -We shall continue monitoring CBC every 2 weeks and provide transfusion support as needed with PRBCs to maintain hemoglobin more than 7.5 or if symptomatic. -We discussed options for Luspatercept to treat his refractory anemia versus consideration of starting hypomethylating agent.-Patient prefers to proceed with Lutathera.  We discussed the pros and cons and potential adverse effects of Lutathera and the patient is agreeable to proceed.   Continue PRBC transfusion as needed for hemoglobin less than 7.5 or if symptomatic  Follow-up Please schedule patient to start Luspatercept in 7 to 10 days with labs and appointment for 1 unit of PRBC MD visit, labs appointment for 1 unit of PRBC 2 weeks after starting Luspatercept  The total time spent in the appointment was 32 minutes*.  All of the patient's questions were answered with apparent satisfaction. The patient knows to call the clinic with any problems, questions or concerns.   Sullivan Lone MD MS AAHIVMS [facility] Hematology/Oncology Physician [facility]  .*Total Encounter Time as defined by  the Centers for Medicare and Medicaid Services includes, in addition to the face-to-face time of a patient visit (documented in the note above) non-face-to-face time: obtaining and reviewing outside history, ordering and reviewing medications, tests or  procedures, care coordination (communications with other health care professionals or caregivers) and documentation in the medical record.

## 2022-05-27 ENCOUNTER — Other Ambulatory Visit: Payer: Self-pay

## 2022-05-28 ENCOUNTER — Other Ambulatory Visit: Payer: Self-pay

## 2022-05-28 ENCOUNTER — Inpatient Hospital Stay: Payer: Medicare Other

## 2022-05-28 DIAGNOSIS — D649 Anemia, unspecified: Secondary | ICD-10-CM

## 2022-05-28 DIAGNOSIS — D469 Myelodysplastic syndrome, unspecified: Secondary | ICD-10-CM | POA: Diagnosis not present

## 2022-05-28 MED ORDER — ACETAMINOPHEN 325 MG PO TABS
650.0000 mg | ORAL_TABLET | Freq: Once | ORAL | Status: AC
  Administered 2022-05-28: 650 mg via ORAL

## 2022-05-28 MED ORDER — METHYLPREDNISOLONE SODIUM SUCC 40 MG IJ SOLR
40.0000 mg | Freq: Once | INTRAMUSCULAR | Status: AC
  Administered 2022-05-28: 40 mg via INTRAVENOUS

## 2022-05-28 MED ORDER — SODIUM CHLORIDE 0.9% IV SOLUTION
250.0000 mL | Freq: Once | INTRAVENOUS | Status: AC
  Administered 2022-05-28: 250 mL via INTRAVENOUS

## 2022-05-29 LAB — TYPE AND SCREEN
ABO/RH(D): A POS
Antibody Screen: NEGATIVE
Unit division: 0
Unit division: 0

## 2022-05-29 LAB — BPAM RBC
Blood Product Expiration Date: 202309012359
Blood Product Expiration Date: 202309012359
ISSUE DATE / TIME: 202308110856
ISSUE DATE / TIME: 202308110856
Unit Type and Rh: 6200
Unit Type and Rh: 6200

## 2022-06-02 ENCOUNTER — Inpatient Hospital Stay: Payer: Medicare Other

## 2022-06-02 ENCOUNTER — Other Ambulatory Visit: Payer: Self-pay

## 2022-06-02 DIAGNOSIS — D469 Myelodysplastic syndrome, unspecified: Secondary | ICD-10-CM

## 2022-06-02 DIAGNOSIS — D649 Anemia, unspecified: Secondary | ICD-10-CM

## 2022-06-02 DIAGNOSIS — D471 Chronic myeloproliferative disease: Secondary | ICD-10-CM

## 2022-06-02 LAB — CBC WITH DIFFERENTIAL (CANCER CENTER ONLY)
Abs Immature Granulocytes: 1.24 10*3/uL — ABNORMAL HIGH (ref 0.00–0.07)
Basophils Absolute: 0.5 10*3/uL — ABNORMAL HIGH (ref 0.0–0.1)
Basophils Relative: 3 %
Eosinophils Absolute: 1.4 10*3/uL — ABNORMAL HIGH (ref 0.0–0.5)
Eosinophils Relative: 8 %
HCT: 25.2 % — ABNORMAL LOW (ref 39.0–52.0)
Hemoglobin: 8.5 g/dL — ABNORMAL LOW (ref 13.0–17.0)
Immature Granulocytes: 7 %
Lymphocytes Relative: 5 %
Lymphs Abs: 0.8 10*3/uL (ref 0.7–4.0)
MCH: 31 pg (ref 26.0–34.0)
MCHC: 33.7 g/dL (ref 30.0–36.0)
MCV: 92 fL (ref 80.0–100.0)
Monocytes Absolute: 1 10*3/uL (ref 0.1–1.0)
Monocytes Relative: 6 %
Neutro Abs: 12.7 10*3/uL — ABNORMAL HIGH (ref 1.7–7.7)
Neutrophils Relative %: 71 %
Platelet Count: 705 10*3/uL — ABNORMAL HIGH (ref 150–400)
RBC: 2.74 MIL/uL — ABNORMAL LOW (ref 4.22–5.81)
RDW: 17.6 % — ABNORMAL HIGH (ref 11.5–15.5)
WBC Count: 17.7 10*3/uL — ABNORMAL HIGH (ref 4.0–10.5)
nRBC: 0 % (ref 0.0–0.2)

## 2022-06-02 LAB — CMP (CANCER CENTER ONLY)
ALT: 12 U/L (ref 0–44)
AST: 10 U/L — ABNORMAL LOW (ref 15–41)
Albumin: 4 g/dL (ref 3.5–5.0)
Alkaline Phosphatase: 63 U/L (ref 38–126)
Anion gap: 3 — ABNORMAL LOW (ref 5–15)
BUN: 19 mg/dL (ref 8–23)
CO2: 29 mmol/L (ref 22–32)
Calcium: 9 mg/dL (ref 8.9–10.3)
Chloride: 106 mmol/L (ref 98–111)
Creatinine: 1.07 mg/dL (ref 0.61–1.24)
GFR, Estimated: >60 mL/min (ref >60)
Glucose, Bld: 146 mg/dL — ABNORMAL HIGH (ref 70–99)
Potassium: 4.9 mmol/L (ref 3.5–5.1)
Sodium: 138 mmol/L (ref 135–145)
Total Bilirubin: 0.7 mg/dL (ref 0.3–1.2)
Total Protein: 6.2 g/dL — ABNORMAL LOW (ref 6.5–8.1)

## 2022-06-02 LAB — SAMPLE TO BLOOD BANK

## 2022-06-02 NOTE — Progress Notes (Signed)
Patient's hgb today was 8.5, no blood transfusion needed at this timeEustaquio Maize, RN aware. Patient left infusion room with no complaints and in no distress. Per pharmacy, patient is unable to get Rebrozyl today due to insurance issues. Patient verbalized understanding.

## 2022-06-02 NOTE — Patient Instructions (Signed)
Luspatercept Injection What is this medication? LUSPATERCEPT (lus PAT er sept) treats low levels of red blood cells (anemia) in the body in people with beta thalassemia or myelodysplastic syndromes. It works by helping the body make more red blood cells. This medicine may be used for other purposes; ask your health care provider or pharmacist if you have questions. COMMON BRAND NAME(S): REBLOZYL What should I tell my care team before I take this medication? They need to know if you have any of these conditions: Have had your spleen removed High blood pressure History of blood clots Tobacco use An unusual or allergic reaction to luspatercept, other medications, foods, dyes or preservatives Pregnant or trying to get pregnant Breast-feeding How should I use this medication? This medication is for injection under the skin. It is given by your care team in a hospital or clinic setting. Talk to your care team about the use of the medication in children. This medication is not approved for use in children. Overdosage: If you think you have taken too much of this medicine contact a poison control center or emergency room at once. NOTE: This medicine is only for you. Do not share this medicine with others. What if I miss a dose? Keep appointments for follow-up doses. It is important not to miss your dose. Call your care team if you are unable to keep an appointment. What may interact with this medication? Interactions are not expected. This list may not describe all possible interactions. Give your health care provider a list of all the medicines, herbs, non-prescription drugs, or dietary supplements you use. Also tell them if you smoke, drink alcohol, or use illegal drugs. Some items may interact with your medicine. What should I watch for while using this medication? Your condition will be monitored carefully while you are receiving this medication. Talk to your care team if you wish to become  pregnant or think you might be pregnant. This medication can cause serious birth defects. Discuss contraceptive options with your care team. Do not breastfeed while taking this medication. You may need blood work done while you are taking this medication. What side effects may I notice from receiving this medication? Side effects that you should report to your care team as soon as possible: Allergic reactions--skin rash, itching, hives, swelling of the face, lips, tongue, or throat Blood clot--pain, swelling, or warmth in the leg, shortness of breath, chest pain Increase in blood pressure Severe back pain, numbness or weakness of the hands, arms, legs, or feet, loss of coordination, loss of bowel or bladder control Side effects that usually do not require medical attention (report these to your care team if they continue or are bothersome): Bone pain Dizziness Fatigue Headache Joint pain Muscle pain Stomach pain This list may not describe all possible side effects. Call your doctor for medical advice about side effects. You may report side effects to FDA at 1-800-FDA-1088. Where should I keep my medication? This medication is given in a hospital or clinic. It will not be stored at home. NOTE: This sheet is a summary. It may not cover all possible information. If you have questions about this medicine, talk to your doctor, pharmacist, or health care provider.  2023 Elsevier/Gold Standard (2021-05-01 00:00:00)  

## 2022-06-10 ENCOUNTER — Inpatient Hospital Stay: Payer: Medicare Other

## 2022-06-10 ENCOUNTER — Other Ambulatory Visit: Payer: Self-pay | Admitting: *Deleted

## 2022-06-10 ENCOUNTER — Other Ambulatory Visit: Payer: Self-pay | Admitting: Hematology

## 2022-06-10 ENCOUNTER — Other Ambulatory Visit: Payer: Self-pay

## 2022-06-10 DIAGNOSIS — D649 Anemia, unspecified: Secondary | ICD-10-CM

## 2022-06-10 DIAGNOSIS — D469 Myelodysplastic syndrome, unspecified: Secondary | ICD-10-CM

## 2022-06-10 DIAGNOSIS — D471 Chronic myeloproliferative disease: Secondary | ICD-10-CM

## 2022-06-10 LAB — CMP (CANCER CENTER ONLY)
ALT: 14 U/L (ref 0–44)
AST: 13 U/L — ABNORMAL LOW (ref 15–41)
Albumin: 4 g/dL (ref 3.5–5.0)
Alkaline Phosphatase: 65 U/L (ref 38–126)
Anion gap: 3 — ABNORMAL LOW (ref 5–15)
BUN: 19 mg/dL (ref 8–23)
CO2: 29 mmol/L (ref 22–32)
Calcium: 8.9 mg/dL (ref 8.9–10.3)
Chloride: 105 mmol/L (ref 98–111)
Creatinine: 0.86 mg/dL (ref 0.61–1.24)
GFR, Estimated: >60 mL/min (ref >60)
Glucose, Bld: 141 mg/dL — ABNORMAL HIGH (ref 70–99)
Potassium: 4.7 mmol/L (ref 3.5–5.1)
Sodium: 137 mmol/L (ref 135–145)
Total Bilirubin: 0.6 mg/dL (ref 0.3–1.2)
Total Protein: 5.8 g/dL — ABNORMAL LOW (ref 6.5–8.1)

## 2022-06-10 LAB — CBC WITH DIFFERENTIAL (CANCER CENTER ONLY)
Abs Immature Granulocytes: 1.13 10*3/uL — ABNORMAL HIGH (ref 0.00–0.07)
Basophils Absolute: 0.4 10*3/uL — ABNORMAL HIGH (ref 0.0–0.1)
Basophils Relative: 3 %
Eosinophils Absolute: 1.2 10*3/uL — ABNORMAL HIGH (ref 0.0–0.5)
Eosinophils Relative: 8 %
HCT: 20.5 % — ABNORMAL LOW (ref 39.0–52.0)
Hemoglobin: 6.8 g/dL — CL (ref 13.0–17.0)
Immature Granulocytes: 8 %
Lymphocytes Relative: 5 %
Lymphs Abs: 0.7 10*3/uL (ref 0.7–4.0)
MCH: 30.8 pg (ref 26.0–34.0)
MCHC: 33.2 g/dL (ref 30.0–36.0)
MCV: 92.8 fL (ref 80.0–100.0)
Monocytes Absolute: 1 10*3/uL (ref 0.1–1.0)
Monocytes Relative: 6 %
Neutro Abs: 10.5 10*3/uL — ABNORMAL HIGH (ref 1.7–7.7)
Neutrophils Relative %: 70 %
Platelet Count: 656 10*3/uL — ABNORMAL HIGH (ref 150–400)
RBC: 2.21 MIL/uL — ABNORMAL LOW (ref 4.22–5.81)
RDW: 18 % — ABNORMAL HIGH (ref 11.5–15.5)
WBC Count: 14.9 10*3/uL — ABNORMAL HIGH (ref 4.0–10.5)
nRBC: 0 % (ref 0.0–0.2)

## 2022-06-10 LAB — SAMPLE TO BLOOD BANK

## 2022-06-10 LAB — PREPARE RBC (CROSSMATCH)

## 2022-06-10 MED ORDER — METHYLPREDNISOLONE SODIUM SUCC 40 MG IJ SOLR: 40.0000 mg | Freq: Once | INTRAMUSCULAR | Status: DC

## 2022-06-10 MED ORDER — LUSPATERCEPT-AAMT 75 MG ~~LOC~~ SOLR
1.0000 mg/kg | Freq: Once | SUBCUTANEOUS | Status: AC
  Administered 2022-06-10: 75 mg via SUBCUTANEOUS
  Filled 2022-06-10: qty 1.5

## 2022-06-10 NOTE — Patient Instructions (Signed)
Luspatercept Injection What is this medication? LUSPATERCEPT (lus PAT er sept) treats low levels of red blood cells (anemia) in the body in people with beta thalassemia or myelodysplastic syndromes. It works by helping the body make more red blood cells. This medicine may be used for other purposes; ask your health care provider or pharmacist if you have questions. COMMON BRAND NAME(S): REBLOZYL What should I tell my care team before I take this medication? They need to know if you have any of these conditions: Have had your spleen removed High blood pressure History of blood clots Tobacco use An unusual or allergic reaction to luspatercept, other medications, foods, dyes or preservatives Pregnant or trying to get pregnant Breast-feeding How should I use this medication? This medication is for injection under the skin. It is given by your care team in a hospital or clinic setting. Talk to your care team about the use of the medication in children. This medication is not approved for use in children. Overdosage: If you think you have taken too much of this medicine contact a poison control center or emergency room at once. NOTE: This medicine is only for you. Do not share this medicine with others. What if I miss a dose? Keep appointments for follow-up doses. It is important not to miss your dose. Call your care team if you are unable to keep an appointment. What may interact with this medication? Interactions are not expected. This list may not describe all possible interactions. Give your health care provider a list of all the medicines, herbs, non-prescription drugs, or dietary supplements you use. Also tell them if you smoke, drink alcohol, or use illegal drugs. Some items may interact with your medicine. What should I watch for while using this medication? Your condition will be monitored carefully while you are receiving this medication. Talk to your care team if you wish to become  pregnant or think you might be pregnant. This medication can cause serious birth defects. Discuss contraceptive options with your care team. Do not breastfeed while taking this medication. You may need blood work done while you are taking this medication. What side effects may I notice from receiving this medication? Side effects that you should report to your care team as soon as possible: Allergic reactions--skin rash, itching, hives, swelling of the face, lips, tongue, or throat Blood clot--pain, swelling, or warmth in the leg, shortness of breath, chest pain Increase in blood pressure Severe back pain, numbness or weakness of the hands, arms, legs, or feet, loss of coordination, loss of bowel or bladder control Side effects that usually do not require medical attention (report these to your care team if they continue or are bothersome): Bone pain Dizziness Fatigue Headache Joint pain Muscle pain Stomach pain This list may not describe all possible side effects. Call your doctor for medical advice about side effects. You may report side effects to FDA at 1-800-FDA-1088. Where should I keep my medication? This medication is given in a hospital or clinic. It will not be stored at home. NOTE: This sheet is a summary. It may not cover all possible information. If you have questions about this medicine, talk to your doctor, pharmacist, or health care provider.  2023 Elsevier/Gold Standard (2021-05-01 00:00:00)  

## 2022-06-11 ENCOUNTER — Inpatient Hospital Stay: Payer: Medicare Other

## 2022-06-11 DIAGNOSIS — D469 Myelodysplastic syndrome, unspecified: Secondary | ICD-10-CM

## 2022-06-11 MED ORDER — SODIUM CHLORIDE 0.9% IV SOLUTION
250.0000 mL | Freq: Once | INTRAVENOUS | Status: AC
  Administered 2022-06-11: 250 mL via INTRAVENOUS

## 2022-06-11 MED ORDER — ACETAMINOPHEN 325 MG PO TABS
650.0000 mg | ORAL_TABLET | Freq: Once | ORAL | Status: AC
  Administered 2022-06-11: 650 mg via ORAL
  Filled 2022-06-11: qty 2

## 2022-06-11 MED ORDER — METHYLPREDNISOLONE SODIUM SUCC 40 MG IJ SOLR
40.0000 mg | Freq: Once | INTRAMUSCULAR | Status: AC
  Administered 2022-06-11: 40 mg via INTRAVENOUS
  Filled 2022-06-11: qty 1

## 2022-06-11 NOTE — Patient Instructions (Signed)
Blood Transfusion, Adult, Care After The following information offers guidance on how to care for yourself after your procedure. Your health care provider may also give you more specific instructions. If you have problems or questions, contact your health care provider. What can I expect after the procedure? After the procedure, it is common to have: Bruising and soreness where the IV was inserted. A headache. Follow these instructions at home: IV insertion site care     Follow instructions from your health care provider about how to take care of your IV insertion site. Make sure you: Wash your hands with soap and water for at least 20 seconds before and after you change your bandage (dressing). If soap and water are not available, use hand sanitizer. Change your dressing as told by your health care provider. Check your IV insertion site every day for signs of infection. Check for: Redness, swelling, or pain. Bleeding from the site. Warmth. Pus or a bad smell. General instructions Take over-the-counter and prescription medicines only as told by your health care provider. Rest as told by your health care provider. Return to your normal activities as told by your health care provider. Keep all follow-up visits. Lab tests may need to be done at certain periods to recheck your blood counts. Contact a health care provider if: You have itching or red, swollen areas of skin (hives). You have a fever or chills. You have pain in the head, back, or chest. You feel anxious or you feel weak after doing your normal activities. You have redness, swelling, warmth, or pain around the IV insertion site. You have blood coming from the IV insertion site that does not stop with pressure. You have pus or a bad smell coming from your IV insertion site. If you received your blood transfusion in an outpatient setting, you will be told whom to contact to report any reactions. Get help right away if: You  have symptoms of a serious allergic or immune system reaction, including: Trouble breathing or shortness of breath. Swelling of the face, feeling flushed, or widespread rash. Dark urine or blood in the urine. Fast heartbeat. These symptoms may be an emergency. Get help right away. Call 911. Do not wait to see if the symptoms will go away. Do not drive yourself to the hospital. Summary Bruising and soreness around the IV insertion site are common. Check your IV insertion site every day for signs of infection. Rest as told by your health care provider. Return to your normal activities as told by your health care provider. Get help right away for symptoms of a serious allergic or immune system reaction to the blood transfusion. This information is not intended to replace advice given to you by your health care provider. Make sure you discuss any questions you have with your health care provider. Document Revised: 01/01/2022 Document Reviewed: 01/01/2022 Elsevier Patient Education  2023 Elsevier Inc.  

## 2022-06-12 LAB — BPAM RBC
Blood Product Expiration Date: 202309112359
ISSUE DATE / TIME: 202308251349
Unit Type and Rh: 6200

## 2022-06-12 LAB — TYPE AND SCREEN
ABO/RH(D): A POS
Antibody Screen: NEGATIVE
Unit division: 0

## 2022-06-15 ENCOUNTER — Other Ambulatory Visit: Payer: Self-pay

## 2022-06-15 DIAGNOSIS — D469 Myelodysplastic syndrome, unspecified: Secondary | ICD-10-CM

## 2022-06-16 ENCOUNTER — Other Ambulatory Visit: Payer: Self-pay

## 2022-06-16 ENCOUNTER — Inpatient Hospital Stay: Payer: Medicare Other

## 2022-06-16 ENCOUNTER — Ambulatory Visit: Payer: Medicare Other | Admitting: Hematology

## 2022-06-16 DIAGNOSIS — D469 Myelodysplastic syndrome, unspecified: Secondary | ICD-10-CM

## 2022-06-16 DIAGNOSIS — D649 Anemia, unspecified: Secondary | ICD-10-CM

## 2022-06-16 LAB — CBC WITH DIFFERENTIAL (CANCER CENTER ONLY)
Abs Immature Granulocytes: 1.61 10*3/uL — ABNORMAL HIGH (ref 0.00–0.07)
Basophils Absolute: 0.6 10*3/uL — ABNORMAL HIGH (ref 0.0–0.1)
Basophils Relative: 3 %
Eosinophils Absolute: 1.2 10*3/uL — ABNORMAL HIGH (ref 0.0–0.5)
Eosinophils Relative: 6 %
HCT: 24.4 % — ABNORMAL LOW (ref 39.0–52.0)
Hemoglobin: 8.3 g/dL — ABNORMAL LOW (ref 13.0–17.0)
Immature Granulocytes: 8 %
Lymphocytes Relative: 4 %
Lymphs Abs: 0.7 10*3/uL (ref 0.7–4.0)
MCH: 31.2 pg (ref 26.0–34.0)
MCHC: 34 g/dL (ref 30.0–36.0)
MCV: 91.7 fL (ref 80.0–100.0)
Monocytes Absolute: 1.2 10*3/uL — ABNORMAL HIGH (ref 0.1–1.0)
Monocytes Relative: 6 %
Neutro Abs: 14.2 10*3/uL — ABNORMAL HIGH (ref 1.7–7.7)
Neutrophils Relative %: 73 %
Platelet Count: 724 10*3/uL — ABNORMAL HIGH (ref 150–400)
RBC: 2.66 MIL/uL — ABNORMAL LOW (ref 4.22–5.81)
RDW: 17.2 % — ABNORMAL HIGH (ref 11.5–15.5)
WBC Count: 19.5 10*3/uL — ABNORMAL HIGH (ref 4.0–10.5)
nRBC: 0 % (ref 0.0–0.2)

## 2022-06-16 LAB — CMP (CANCER CENTER ONLY)
ALT: 14 U/L (ref 0–44)
AST: 11 U/L — ABNORMAL LOW (ref 15–41)
Albumin: 4.1 g/dL (ref 3.5–5.0)
Alkaline Phosphatase: 69 U/L (ref 38–126)
Anion gap: 3 — ABNORMAL LOW (ref 5–15)
BUN: 17 mg/dL (ref 8–23)
CO2: 29 mmol/L (ref 22–32)
Calcium: 9 mg/dL (ref 8.9–10.3)
Chloride: 104 mmol/L (ref 98–111)
Creatinine: 1.06 mg/dL (ref 0.61–1.24)
GFR, Estimated: >60 mL/min (ref >60)
Glucose, Bld: 154 mg/dL — ABNORMAL HIGH (ref 70–99)
Potassium: 4.8 mmol/L (ref 3.5–5.1)
Sodium: 136 mmol/L (ref 135–145)
Total Bilirubin: 0.6 mg/dL (ref 0.3–1.2)
Total Protein: 6.1 g/dL — ABNORMAL LOW (ref 6.5–8.1)

## 2022-06-16 LAB — SAMPLE TO BLOOD BANK

## 2022-06-16 NOTE — Progress Notes (Signed)
Patient's hgb 8.3 today. Patient does not meet parameters for blood transfusion today. He denies any symptoms. Patient provided with a copy of his lab work. He did have questions regarding upcoming appointments. Dr. Grier Mitts nurse asked to give patient a call. Patient discharged home with his wife.

## 2022-06-22 ENCOUNTER — Encounter (INDEPENDENT_AMBULATORY_CARE_PROVIDER_SITE_OTHER): Payer: Medicare Other | Admitting: Ophthalmology

## 2022-06-23 ENCOUNTER — Inpatient Hospital Stay: Payer: Medicare Other | Attending: Hematology | Admitting: Hematology

## 2022-06-23 ENCOUNTER — Other Ambulatory Visit: Payer: Self-pay

## 2022-06-23 ENCOUNTER — Inpatient Hospital Stay: Payer: Medicare Other

## 2022-06-23 VITALS — BP 125/67 | HR 69 | Temp 97.0°F | Resp 18 | Wt 167.5 lb

## 2022-06-23 DIAGNOSIS — D649 Anemia, unspecified: Secondary | ICD-10-CM

## 2022-06-23 DIAGNOSIS — D469 Myelodysplastic syndrome, unspecified: Secondary | ICD-10-CM

## 2022-06-23 DIAGNOSIS — Z79899 Other long term (current) drug therapy: Secondary | ICD-10-CM | POA: Insufficient documentation

## 2022-06-23 DIAGNOSIS — D471 Chronic myeloproliferative disease: Secondary | ICD-10-CM

## 2022-06-23 LAB — CBC WITH DIFFERENTIAL (CANCER CENTER ONLY)
Abs Immature Granulocytes: 2.17 10*3/uL — ABNORMAL HIGH (ref 0.00–0.07)
Basophils Absolute: 0.8 10*3/uL — ABNORMAL HIGH (ref 0.0–0.1)
Basophils Relative: 3 %
Eosinophils Absolute: 1.1 10*3/uL — ABNORMAL HIGH (ref 0.0–0.5)
Eosinophils Relative: 5 %
HCT: 23.4 % — ABNORMAL LOW (ref 39.0–52.0)
Hemoglobin: 7.8 g/dL — ABNORMAL LOW (ref 13.0–17.0)
Immature Granulocytes: 10 %
Lymphocytes Relative: 3 %
Lymphs Abs: 0.7 10*3/uL (ref 0.7–4.0)
MCH: 30.8 pg (ref 26.0–34.0)
MCHC: 33.3 g/dL (ref 30.0–36.0)
MCV: 92.5 fL (ref 80.0–100.0)
Monocytes Absolute: 1.2 10*3/uL — ABNORMAL HIGH (ref 0.1–1.0)
Monocytes Relative: 6 %
Neutro Abs: 16 10*3/uL — ABNORMAL HIGH (ref 1.7–7.7)
Neutrophils Relative %: 73 %
Platelet Count: 754 10*3/uL — ABNORMAL HIGH (ref 150–400)
RBC: 2.53 MIL/uL — ABNORMAL LOW (ref 4.22–5.81)
RDW: 17.7 % — ABNORMAL HIGH (ref 11.5–15.5)
WBC Count: 21.9 10*3/uL — ABNORMAL HIGH (ref 4.0–10.5)
nRBC: 0 % (ref 0.0–0.2)

## 2022-06-23 LAB — CMP (CANCER CENTER ONLY)
ALT: 12 U/L (ref 0–44)
AST: 12 U/L — ABNORMAL LOW (ref 15–41)
Albumin: 4.1 g/dL (ref 3.5–5.0)
Alkaline Phosphatase: 76 U/L (ref 38–126)
Anion gap: 6 (ref 5–15)
BUN: 21 mg/dL (ref 8–23)
CO2: 26 mmol/L (ref 22–32)
Calcium: 8.8 mg/dL — ABNORMAL LOW (ref 8.9–10.3)
Chloride: 105 mmol/L (ref 98–111)
Creatinine: 1.21 mg/dL (ref 0.61–1.24)
GFR, Estimated: >60 mL/min (ref >60)
Glucose, Bld: 143 mg/dL — ABNORMAL HIGH (ref 70–99)
Potassium: 4.4 mmol/L (ref 3.5–5.1)
Sodium: 137 mmol/L (ref 135–145)
Total Bilirubin: 0.7 mg/dL (ref 0.3–1.2)
Total Protein: 6.3 g/dL — ABNORMAL LOW (ref 6.5–8.1)

## 2022-06-28 NOTE — Progress Notes (Signed)
HEMATOLOGY/ONCOLOGY PROGRESS NOTE:   Date of Service: 06/23/2022   Patient Care Team: Fernando Shanks, MD (Inactive) as PCP - General (Family Medicine)  CHIEF COMPLAINTS:  Follow-up for continued evaluation and management of JAK2 positive myeloproliferative neoplasm/MDS  INTERVAL HISTORY:  [name] is a 76 y.o. male here for continued evaluation and management of his MPN/MDS. He reports He is doing well with no new symptoms or concerns.  He notes no issues with any notable toxicities from his first dose of Luspatercept.  He notes persistent grade 1 fatigue.  No lightheadedness or dizziness. No new infection issues. No other new or acute focal symptoms.  Labs done today were reviewed in detail.  MEDICAL HISTORY:  Past Medical History:  Diagnosis Date   Arthritis of right knee    Cervicalgia    Chest discomfort    normal stress echo   Chest pain    Colon polyps    Dyslipidemia    Fluttering sensation of heart    GERD without esophagitis    Hematochezia    Hyperglycemia    Hyperlipidemia    Internal hemorrhoids    Kidney stones    Male erectile dysfunction, unspecified    Mixed dyslipidemia    Mixed hyperlipidemia    Ocular migraine    PAC (premature atrial contraction)    Personal history of colonic polyps    Prediabetes    Residual hemorrhoidal skin tags    Unspecified hemorrhoids     SURGICAL HISTORY: Past Surgical History:  Procedure Laterality Date   BONE MARROW BIOPSY     KIDNEY STONE SURGERY     retrieval    SOCIAL HISTORY: Social History   Socioeconomic History   Marital status: Married    Spouse name: Not on file   Number of children: Not on file   Years of education: Not on file   Highest education level: Not on file  Occupational History   Not on file  Tobacco Use   Smoking status: Former    Types: Cigarettes   Smokeless tobacco: Never  Substance and Sexual Activity   Alcohol use: Yes    Alcohol/week: 1.0 standard  drink of alcohol    Types: 1 Cans of beer per week   Drug use: Not on file   Sexual activity: Not on file  Other Topics Concern   Not on file  Social History Narrative   Not on file   Social Determinants of Health   Financial Resource Strain: Not on file  Food Insecurity: Not on file  Transportation Needs: Not on file  Physical Activity: Not on file  Stress: Not on file  Social Connections: Not on file  Intimate Partner Violence: Not on file    FAMILY HISTORY: Family History  Problem Relation Age of Onset   Stroke Brother    Congestive Heart Failure Brother     ALLERGIES:  has No Known Allergies.  MEDICATIONS:  Current Outpatient Medications  Medication Sig Dispense Refill   aspirin EC 81 MG tablet Take 81 mg by mouth daily. Swallow whole.     B Complex Vitamins (B COMPLEX PO) Take 1 tablet by mouth daily.     Cholecalciferol 50 MCG (2000 UT) TABS Take 2,000 Units by mouth daily.     docusate sodium (COLACE) 100 MG capsule Take 100 mg by mouth 2 (two) times daily.     FIBER PO Take 1 capsule by mouth daily. Unknown strength     fish oil-omega-3  fatty acids 1000 MG capsule Take 1 g by mouth 2 (two) times daily. Strength 359m/1500mg     GLUCOSAMINE CHONDROITIN MSM PO Take 1 tablet by mouth daily. Strength 1500/1500     Misc Natural Products (PROSTATE SUPPORT PO) Take 1 capsule by mouth daily. Unknown strength     Multiple Vitamin (MULTIVITAMIN) tablet Take 1 tablet by mouth daily. Unknown strength     psyllium (METAMUCIL) 58.6 % powder Take 1 packet by mouth daily.     Turmeric (QC TUMERIC COMPLEX PO) Take 750 mg by mouth daily. Unknown strenght     vitamin C (ASCORBIC ACID) 250 MG tablet Take 500 mg by mouth daily.     No current facility-administered medications for this visit.    REVIEW OF SYSTEMS:    10 Point review of Systems was done is negative except as noted above.   PHYSICAL EXAMINATION: .BP 125/67 (BP Location: Left Arm, Patient Position: Sitting)    Pulse 69   Temp (!) 97 F (36.1 C) (Oral)   Resp 18   Wt 167 lb 8 oz (76 kg)   SpO2 95%   BMI 26.23 kg/m  NAD GENERAL:alert, in no acute distress and comfortable SKIN: no acute rashes, no significant lesions EYES: conjunctiva are pink and non-injected, sclera anicteric NECK: supple, no JVD LYMPH:  no palpable lymphadenopathy in the cervical, axillary or inguinal regions LUNGS: clear to auscultation b/l with normal respiratory effort HEART: regular rate & rhythm ABDOMEN:  normoactive bowel sounds , non tender, not distended. Extremity: no pedal edema PSYCH: alert & oriented x 3 with fluent speech NEURO: no focal motor/sensory deficits  LABORATORY DATA:  I have reviewed the data as listed  .    Latest Ref Rng & Units 06/23/2022    9:52 AM 06/16/2022    9:26 AM 06/10/2022    8:57 AM  CBC  WBC 4.0 - 10.5 K/uL 21.9  19.5  14.9   Hemoglobin 13.0 - 17.0 g/dL 7.8  8.3  6.8   Hematocrit 39.0 - 52.0 % 23.4  24.4  20.5   Platelets 150 - 400 K/uL 754  724  656     .    Latest Ref Rng & Units 06/23/2022    9:52 AM 06/16/2022    9:26 AM 06/10/2022    8:57 AM  CMP  Glucose 70 - 99 mg/dL 143  154  141   BUN 8 - 23 mg/dL '21  17  19   ' Creatinine 0.61 - 1.24 mg/dL 1.21  1.06  0.86   Sodium 135 - 145 mmol/L 137  136  137   Potassium 3.5 - 5.1 mmol/L 4.4  4.8  4.7   Chloride 98 - 111 mmol/L 105  104  105   CO2 22 - 32 mmol/L '26  29  29   ' Calcium 8.9 - 10.3 mg/dL 8.8  9.0  8.9   Total Protein 6.5 - 8.1 g/dL 6.3  6.1  5.8   Total Bilirubin 0.3 - 1.2 mg/dL 0.7  0.6  0.6   Alkaline Phos 38 - 126 U/L 76  69  65   AST 15 - 41 U/L '12  11  13   ' ALT 0 - 44 U/L '12  14  14    ' . Lab Results  Component Value Date   LDH 110 01/04/2022    02/23/2021 BCR ABL    02/23/2021 JAK2   Surgical Pathology  CASE: [id number]  PATIENT: Fernando Lee  Bone Marrow Report  Clinical History: MPN with progressive anemia  (BH)      DIAGNOSIS:   BONE MARROW, ASPIRATE, CLOT, CORE:   -Hypercellular bone marrow with features of myeloid neoplasm  -See comment   PERIPHERAL BLOOD:  -Macrocytic anemia  -Leukocytosis  -Thrombocytosis   COMMENT:   The bone marrow/peripheral blood show persistent involvement by  previously known myeloid neoplasm.  There is a myeloproliferative  component as supported by previous JAK2 positivity.  However, there are  also dyspoietic changes primarily involving the megakaryocytic cell line  with numerous hypolobated/unilobated forms in addition to  dysgranulopoiesis to a lesser extent.  This is associated with  eosinophilia.  It is not entirely clear whether the overall findings  represent a myeloproliferative neoplasm with treatment related changes  or represent a primary myeloproliferative/myelodysplastic neoplasm  including but not limited to myeloid neoplasms with eosinophilia.  Correlation with cytogenetic and FISH studies strongly recommended.      RADIOGRAPHIC STUDIES: I have personally reviewed the radiological images as listed and agreed with the findings in the report. No results found.   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  With associated MDS causing refractory anemia.  No polycythemia.  Mild leukocytosis.   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 Has grade 1 out of 3 reticulin fibrosis.  Uncertain if this is primary or secondary.  LDH has remained within normal limits.  PLAN -Labs done today were reviewed in detail. CBC shows elevated WBC at 21.9k, low Hgb of 7.8, and elevated platelets of 754k. CMP stable. -The diagnosis of JAK2 positive MPN/MDS was confirmed. -His recent labs recorded vitals were reviewed and the fact that he received 2 units of PRBC support. -We shall continue monitoring CBC every 2 weeks and provide transfusion support as needed with PRBCs to maintain hemoglobin more than 7.5 or if  symptomatic. -He has started Luspatercept and is tolerating it well with no prohibitive toxicities. -He will be scheduled for continued monitoring and transfusion support as needed.   Continue PRBC transfusion as needed for hemoglobin less than 7.5 or if symptomatic  Follow-up Continue Luspatercept every 3 weeks with labs and MD visit as per orders. Will need labs and prn PRBC transfusion (1 unit) every 2 weeks x 4  The total time spent in the appointment was 22 minutes*.  All of the patient's questions were answered with apparent satisfaction. The patient knows to call the clinic with any problems, questions or concerns.   Sullivan Lone MD MS AAHIVMS [facility] Hematology/Oncology Physician [facility]  .*Total Encounter Time as defined by the Centers for Medicare and Medicaid Services includes, in addition to the face-to-face time of a patient visit (documented in the note above) non-face-to-face time: obtaining and reviewing outside history, ordering and reviewing medications, tests or procedures, care coordination (communications with other health care professionals or caregivers) and documentation in the medical record.  I, Melene Muller, am acting as scribe for Dr. Sullivan Lone, MD.  .I have reviewed the above documentation for accuracy and completeness, and I agree with the above. Brunetta Genera MD

## 2022-06-29 ENCOUNTER — Encounter: Payer: Self-pay | Admitting: Hematology

## 2022-06-29 ENCOUNTER — Telehealth: Payer: Self-pay | Admitting: Hematology

## 2022-06-29 NOTE — Telephone Encounter (Signed)
Scheduled follow-up appointments per 9/6 los. Patient is aware. 

## 2022-06-30 ENCOUNTER — Inpatient Hospital Stay: Payer: Medicare Other

## 2022-06-30 ENCOUNTER — Other Ambulatory Visit: Payer: Self-pay

## 2022-06-30 VITALS — BP 108/67 | HR 73 | Temp 97.9°F | Resp 17 | Wt 167.5 lb

## 2022-06-30 DIAGNOSIS — D649 Anemia, unspecified: Secondary | ICD-10-CM

## 2022-06-30 DIAGNOSIS — D469 Myelodysplastic syndrome, unspecified: Secondary | ICD-10-CM | POA: Diagnosis not present

## 2022-06-30 LAB — CMP (CANCER CENTER ONLY)
ALT: 12 U/L (ref 0–44)
AST: 11 U/L — ABNORMAL LOW (ref 15–41)
Albumin: 4 g/dL (ref 3.5–5.0)
Alkaline Phosphatase: 73 U/L (ref 38–126)
Anion gap: 5 (ref 5–15)
BUN: 22 mg/dL (ref 8–23)
CO2: 27 mmol/L (ref 22–32)
Calcium: 8.9 mg/dL (ref 8.9–10.3)
Chloride: 104 mmol/L (ref 98–111)
Creatinine: 1.18 mg/dL (ref 0.61–1.24)
GFR, Estimated: >60 mL/min (ref >60)
Glucose, Bld: 139 mg/dL — ABNORMAL HIGH (ref 70–99)
Potassium: 4.9 mmol/L (ref 3.5–5.1)
Sodium: 136 mmol/L (ref 135–145)
Total Bilirubin: 0.7 mg/dL (ref 0.3–1.2)
Total Protein: 5.8 g/dL — ABNORMAL LOW (ref 6.5–8.1)

## 2022-06-30 LAB — CBC WITH DIFFERENTIAL (CANCER CENTER ONLY)
Abs Immature Granulocytes: 2.03 10*3/uL — ABNORMAL HIGH (ref 0.00–0.07)
Basophils Absolute: 0.5 10*3/uL — ABNORMAL HIGH (ref 0.0–0.1)
Basophils Relative: 2 %
Eosinophils Absolute: 1.3 10*3/uL — ABNORMAL HIGH (ref 0.0–0.5)
Eosinophils Relative: 7 %
HCT: 19.1 % — ABNORMAL LOW (ref 39.0–52.0)
Hemoglobin: 6.4 g/dL — CL (ref 13.0–17.0)
Immature Granulocytes: 10 %
Lymphocytes Relative: 4 %
Lymphs Abs: 0.8 10*3/uL (ref 0.7–4.0)
MCH: 31.1 pg (ref 26.0–34.0)
MCHC: 33.5 g/dL (ref 30.0–36.0)
MCV: 92.7 fL (ref 80.0–100.0)
Monocytes Absolute: 1.3 10*3/uL — ABNORMAL HIGH (ref 0.1–1.0)
Monocytes Relative: 6 %
Neutro Abs: 14.3 10*3/uL — ABNORMAL HIGH (ref 1.7–7.7)
Neutrophils Relative %: 71 %
Platelet Count: 721 10*3/uL — ABNORMAL HIGH (ref 150–400)
RBC: 2.06 MIL/uL — ABNORMAL LOW (ref 4.22–5.81)
RDW: 17.6 % — ABNORMAL HIGH (ref 11.5–15.5)
WBC Count: 20.1 10*3/uL — ABNORMAL HIGH (ref 4.0–10.5)
nRBC: 0 % (ref 0.0–0.2)

## 2022-06-30 LAB — SAMPLE TO BLOOD BANK

## 2022-06-30 LAB — PREPARE RBC (CROSSMATCH)

## 2022-06-30 MED ORDER — LUSPATERCEPT-AAMT 75 MG ~~LOC~~ SOLR
1.0000 mg/kg | Freq: Once | SUBCUTANEOUS | Status: AC
  Administered 2022-06-30: 75 mg via SUBCUTANEOUS
  Filled 2022-06-30: qty 1.5

## 2022-06-30 MED ORDER — ACETAMINOPHEN 325 MG PO TABS
650.0000 mg | ORAL_TABLET | Freq: Once | ORAL | Status: AC
  Administered 2022-06-30: 650 mg via ORAL
  Filled 2022-06-30: qty 2

## 2022-06-30 MED ORDER — METHYLPREDNISOLONE SODIUM SUCC 40 MG IJ SOLR
40.0000 mg | Freq: Once | INTRAMUSCULAR | Status: AC
  Administered 2022-06-30: 40 mg via INTRAVENOUS
  Filled 2022-06-30: qty 1

## 2022-06-30 NOTE — Patient Instructions (Addendum)
Luspatercept Injection What is this medication? LUSPATERCEPT (lus PAT er sept) treats low levels of red blood cells (anemia) in the body in people with beta thalassemia or myelodysplastic syndromes. It works by helping the body make more red blood cells. This medicine may be used for other purposes; ask your health care provider or pharmacist if you have questions. COMMON BRAND NAME(S): REBLOZYL What should I tell my care team before I take this medication? They need to know if you have any of these conditions: Have had your spleen removed High blood pressure History of blood clots Tobacco use An unusual or allergic reaction to luspatercept, other medications, foods, dyes or preservatives Pregnant or trying to get pregnant Breast-feeding How should I use this medication? This medication is for injection under the skin. It is given by your care team in a hospital or clinic setting. Talk to your care team about the use of the medication in children. This medication is not approved for use in children. Overdosage: If you think you have taken too much of this medicine contact a poison control center or emergency room at once. NOTE: This medicine is only for you. Do not share this medicine with others. What if I miss a dose? Keep appointments for follow-up doses. It is important not to miss your dose. Call your care team if you are unable to keep an appointment. What may interact with this medication? Interactions are not expected. This list may not describe all possible interactions. Give your health care provider a list of all the medicines, herbs, non-prescription drugs, or dietary supplements you use. Also tell them if you smoke, drink alcohol, or use illegal drugs. Some items may interact with your medicine. What should I watch for while using this medication? Your condition will be monitored carefully while you are receiving this medication. Talk to your care team if you wish to become  pregnant or think you might be pregnant. This medication can cause serious birth defects. Discuss contraceptive options with your care team. Do not breastfeed while taking this medication. You may need blood work done while you are taking this medication. What side effects may I notice from receiving this medication? Side effects that you should report to your care team as soon as possible: Allergic reactions--skin rash, itching, hives, swelling of the face, lips, tongue, or throat Blood clot--pain, swelling, or warmth in the leg, shortness of breath, chest pain Increase in blood pressure Severe back pain, numbness or weakness of the hands, arms, legs, or feet, loss of coordination, loss of bowel or bladder control Side effects that usually do not require medical attention (report these to your care team if they continue or are bothersome): Bone pain Dizziness Fatigue Headache Joint pain Muscle pain Stomach pain This list may not describe all possible side effects. Call your doctor for medical advice about side effects. You may report side effects to FDA at 1-800-FDA-1088. Where should I keep my medication? This medication is given in a hospital or clinic. It will not be stored at home. NOTE: This sheet is a summary. It may not cover all possible information. If you have questions about this medicine, talk to your doctor, pharmacist, or health care provider.  2023 Elsevier/Gold Standard (2021-05-01 00:00:00)  Blood Transfusion, Adult, Care After After a blood transfusion, it is common to have: Bruising and soreness at the IV site. A headache. Follow these instructions at home: Your doctor may give you more instructions. If you have problems, contact your doctor.  Insertion site care     Follow instructions from your doctor about how to take care of your insertion site. This is where an IV tube was put into your vein. Make sure you: Wash your hands with soap and water for at least 20  seconds before and after you change your bandage. If you cannot use soap and water, use hand sanitizer. Change your bandage as told by your doctor. Check your insertion site every day for signs of infection. Check for: Redness, swelling, or pain. Bleeding from the site. Warmth. Pus or a bad smell. General instructions Take over-the-counter and prescription medicines only as told by your doctor. Rest as told by your doctor. Go back to your normal activities as told by your doctor. Keep all follow-up visits. You may need to have tests at certain times to check your blood. Contact a doctor if: You have itching or red, swollen areas of skin (hives). You have a fever or chills. You have pain in the head, back, or chest. You feel worried or nervous (anxious). You feel weak after doing your normal activities. You have any of these problems at the insertion site: Redness, swelling, warmth, or pain. Bleeding that does not stop with pressure. Pus or a bad smell. If you received your blood transfusion in an outpatient setting, you will be told whom to contact to report any reactions. Get help right away if: You have signs of a serious reaction. This may be coming from an allergy or the body's defense system (immune system). Signs include: Trouble breathing or shortness of breath. Swelling of the face or feeling warm (flushed). A widespread rash. Dark pee (urine) or blood in the pee. Fast heartbeat. These symptoms may be an emergency. Get help right away. Call 911. Do not wait to see if the symptoms will go away. Do not drive yourself to the hospital. Summary Bruising and soreness at the IV site are common. Check your insertion site every day for signs of infection. Rest as told by your doctor. Go back to your normal activities as told by your doctor. Get help right away if you have signs of a serious reaction. This information is not intended to replace advice given to you by your health  care provider. Make sure you discuss any questions you have with your health care provider. Document Revised: 01/01/2022 Document Reviewed: 01/01/2022 Elsevier Patient Education  [place].

## 2022-07-01 LAB — TYPE AND SCREEN
ABO/RH(D): A POS
Antibody Screen: NEGATIVE
Unit division: 0

## 2022-07-01 LAB — BPAM RBC
Blood Product Expiration Date: 202310042359
ISSUE DATE / TIME: 202309131246
Unit Type and Rh: 6200

## 2022-07-03 ENCOUNTER — Other Ambulatory Visit: Payer: Self-pay

## 2022-07-09 ENCOUNTER — Other Ambulatory Visit: Payer: Self-pay

## 2022-07-09 ENCOUNTER — Inpatient Hospital Stay: Payer: Medicare Other

## 2022-07-09 DIAGNOSIS — D469 Myelodysplastic syndrome, unspecified: Secondary | ICD-10-CM

## 2022-07-09 LAB — CMP (CANCER CENTER ONLY)
ALT: 12 U/L (ref 0–44)
AST: 11 U/L — ABNORMAL LOW (ref 15–41)
Albumin: 3.8 g/dL (ref 3.5–5.0)
Alkaline Phosphatase: 72 U/L (ref 38–126)
Anion gap: 4 — ABNORMAL LOW (ref 5–15)
BUN: 28 mg/dL — ABNORMAL HIGH (ref 8–23)
CO2: 27 mmol/L (ref 22–32)
Calcium: 8.6 mg/dL — ABNORMAL LOW (ref 8.9–10.3)
Chloride: 108 mmol/L (ref 98–111)
Creatinine: 1.04 mg/dL (ref 0.61–1.24)
GFR, Estimated: >60 mL/min (ref >60)
Glucose, Bld: 154 mg/dL — ABNORMAL HIGH (ref 70–99)
Potassium: 4.5 mmol/L (ref 3.5–5.1)
Sodium: 139 mmol/L (ref 135–145)
Total Bilirubin: 0.5 mg/dL (ref 0.3–1.2)
Total Protein: 5.7 g/dL — ABNORMAL LOW (ref 6.5–8.1)

## 2022-07-09 LAB — CBC WITH DIFFERENTIAL (CANCER CENTER ONLY)
Abs Immature Granulocytes: 1.87 10*3/uL — ABNORMAL HIGH (ref 0.00–0.07)
Basophils Absolute: 0.3 10*3/uL — ABNORMAL HIGH (ref 0.0–0.1)
Basophils Relative: 2 %
Eosinophils Absolute: 1.3 10*3/uL — ABNORMAL HIGH (ref 0.0–0.5)
Eosinophils Relative: 7 %
HCT: 16.1 % — ABNORMAL LOW (ref 39.0–52.0)
Hemoglobin: 5.4 g/dL — CL (ref 13.0–17.0)
Immature Granulocytes: 10 %
Lymphocytes Relative: 5 %
Lymphs Abs: 0.8 10*3/uL (ref 0.7–4.0)
MCH: 31 pg (ref 26.0–34.0)
MCHC: 33.5 g/dL (ref 30.0–36.0)
MCV: 92.5 fL (ref 80.0–100.0)
Monocytes Absolute: 1.2 10*3/uL — ABNORMAL HIGH (ref 0.1–1.0)
Monocytes Relative: 7 %
Neutro Abs: 12.6 10*3/uL — ABNORMAL HIGH (ref 1.7–7.7)
Neutrophils Relative %: 69 %
Platelet Count: 679 10*3/uL — ABNORMAL HIGH (ref 150–400)
RBC: 1.74 MIL/uL — ABNORMAL LOW (ref 4.22–5.81)
RDW: 17.6 % — ABNORMAL HIGH (ref 11.5–15.5)
WBC Count: 18.1 10*3/uL — ABNORMAL HIGH (ref 4.0–10.5)
nRBC: 0 % (ref 0.0–0.2)

## 2022-07-09 LAB — RETIC PANEL
Immature Retic Fract: 7.6 % (ref 2.3–15.9)
RBC.: 1.75 MIL/uL — ABNORMAL LOW (ref 4.22–5.81)
Retic Count, Absolute: 21.6 10*3/uL (ref 19.0–186.0)
Retic Ct Pct: 1.3 % (ref 0.4–3.1)
Reticulocyte Hemoglobin: 35 pg (ref >27.9)

## 2022-07-09 LAB — PREPARE RBC (CROSSMATCH)

## 2022-07-10 ENCOUNTER — Inpatient Hospital Stay: Payer: Medicare Other

## 2022-07-10 DIAGNOSIS — D469 Myelodysplastic syndrome, unspecified: Secondary | ICD-10-CM

## 2022-07-10 MED ORDER — SODIUM CHLORIDE 0.9% IV SOLUTION
250.0000 mL | Freq: Once | INTRAVENOUS | Status: AC
  Administered 2022-07-10: 250 mL via INTRAVENOUS

## 2022-07-10 MED ORDER — ACETAMINOPHEN 325 MG PO TABS
650.0000 mg | ORAL_TABLET | Freq: Once | ORAL | Status: AC
  Administered 2022-07-10: 650 mg via ORAL

## 2022-07-10 MED ORDER — METHYLPREDNISOLONE SODIUM SUCC 40 MG IJ SOLR
40.0000 mg | Freq: Once | INTRAMUSCULAR | Status: AC
  Administered 2022-07-10: 40 mg via INTRAVENOUS
  Filled 2022-07-10: qty 1

## 2022-07-10 NOTE — Patient Instructions (Signed)
Blood Transfusion, Adult, Care After The following information offers guidance on how to care for yourself after your procedure. Your health care provider may also give you more specific instructions. If you have problems or questions, contact your health care provider. What can I expect after the procedure? After the procedure, it is common to have: Bruising and soreness where the IV was inserted. A headache. Follow these instructions at home: IV insertion site care     Follow instructions from your health care provider about how to take care of your IV insertion site. Make sure you: Wash your hands with soap and water for at least 20 seconds before and after you change your bandage (dressing). If soap and water are not available, use hand sanitizer. Change your dressing as told by your health care provider. Check your IV insertion site every day for signs of infection. Check for: Redness, swelling, or pain. Bleeding from the site. Warmth. Pus or a bad smell. General instructions Take over-the-counter and prescription medicines only as told by your health care provider. Rest as told by your health care provider. Return to your normal activities as told by your health care provider. Keep all follow-up visits. Lab tests may need to be done at certain periods to recheck your blood counts. Contact a health care provider if: You have itching or red, swollen areas of skin (hives). You have a fever or chills. You have pain in the head, back, or chest. You feel anxious or you feel weak after doing your normal activities. You have redness, swelling, warmth, or pain around the IV insertion site. You have blood coming from the IV insertion site that does not stop with pressure. You have pus or a bad smell coming from your IV insertion site. If you received your blood transfusion in an outpatient setting, you will be told whom to contact to report any reactions. Get help right away if: You  have symptoms of a serious allergic or immune system reaction, including: Trouble breathing or shortness of breath. Swelling of the face, feeling flushed, or widespread rash. Dark urine or blood in the urine. Fast heartbeat. These symptoms may be an emergency. Get help right away. Call 911. Do not wait to see if the symptoms will go away. Do not drive yourself to the hospital. Summary Bruising and soreness around the IV insertion site are common. Check your IV insertion site every day for signs of infection. Rest as told by your health care provider. Return to your normal activities as told by your health care provider. Get help right away for symptoms of a serious allergic or immune system reaction to the blood transfusion. This information is not intended to replace advice given to you by your health care provider. Make sure you discuss any questions you have with your health care provider. Document Revised: 01/01/2022 Document Reviewed: 01/01/2022 Elsevier Patient Education  2023 Elsevier Inc.  

## 2022-07-11 LAB — TYPE AND SCREEN
ABO/RH(D): A POS
Antibody Screen: NEGATIVE
Unit division: 0
Unit division: 0

## 2022-07-11 LAB — BPAM RBC
Blood Product Expiration Date: 202310122359
Blood Product Expiration Date: 202310122359
ISSUE DATE / TIME: 202309230822
ISSUE DATE / TIME: 202309230822
Unit Type and Rh: 6200
Unit Type and Rh: 6200

## 2022-07-12 ENCOUNTER — Encounter: Payer: Self-pay | Admitting: Hematology

## 2022-07-12 ENCOUNTER — Other Ambulatory Visit: Payer: Self-pay | Admitting: *Deleted

## 2022-07-12 DIAGNOSIS — D469 Myelodysplastic syndrome, unspecified: Secondary | ICD-10-CM

## 2022-07-13 ENCOUNTER — Inpatient Hospital Stay: Payer: Medicare Other

## 2022-07-13 ENCOUNTER — Other Ambulatory Visit: Payer: Self-pay

## 2022-07-13 DIAGNOSIS — D469 Myelodysplastic syndrome, unspecified: Secondary | ICD-10-CM | POA: Diagnosis not present

## 2022-07-13 LAB — CBC WITH DIFFERENTIAL (CANCER CENTER ONLY)
Abs Immature Granulocytes: 1.76 10*3/uL — ABNORMAL HIGH (ref 0.00–0.07)
Basophils Absolute: 0.5 10*3/uL — ABNORMAL HIGH (ref 0.0–0.1)
Basophils Relative: 3 %
Eosinophils Absolute: 1.5 10*3/uL — ABNORMAL HIGH (ref 0.0–0.5)
Eosinophils Relative: 7 %
HCT: 21.8 % — ABNORMAL LOW (ref 39.0–52.0)
Hemoglobin: 7.3 g/dL — ABNORMAL LOW (ref 13.0–17.0)
Immature Granulocytes: 9 %
Lymphocytes Relative: 4 %
Lymphs Abs: 0.8 10*3/uL (ref 0.7–4.0)
MCH: 30.4 pg (ref 26.0–34.0)
MCHC: 33.5 g/dL (ref 30.0–36.0)
MCV: 90.8 fL (ref 80.0–100.0)
Monocytes Absolute: 1.4 10*3/uL — ABNORMAL HIGH (ref 0.1–1.0)
Monocytes Relative: 7 %
Neutro Abs: 13.8 10*3/uL — ABNORMAL HIGH (ref 1.7–7.7)
Neutrophils Relative %: 70 %
Platelet Count: 609 10*3/uL — ABNORMAL HIGH (ref 150–400)
RBC: 2.4 MIL/uL — ABNORMAL LOW (ref 4.22–5.81)
RDW: 16.6 % — ABNORMAL HIGH (ref 11.5–15.5)
WBC Count: 19.7 10*3/uL — ABNORMAL HIGH (ref 4.0–10.5)
nRBC: 0 % (ref 0.0–0.2)

## 2022-07-13 LAB — CMP (CANCER CENTER ONLY)
ALT: 12 U/L (ref 0–44)
AST: 10 U/L — ABNORMAL LOW (ref 15–41)
Albumin: 4 g/dL (ref 3.5–5.0)
Alkaline Phosphatase: 68 U/L (ref 38–126)
Anion gap: 4 — ABNORMAL LOW (ref 5–15)
BUN: 25 mg/dL — ABNORMAL HIGH (ref 8–23)
CO2: 28 mmol/L (ref 22–32)
Calcium: 8.8 mg/dL — ABNORMAL LOW (ref 8.9–10.3)
Chloride: 108 mmol/L (ref 98–111)
Creatinine: 1.22 mg/dL (ref 0.61–1.24)
GFR, Estimated: >60 mL/min (ref >60)
Glucose, Bld: 140 mg/dL — ABNORMAL HIGH (ref 70–99)
Potassium: 4.8 mmol/L (ref 3.5–5.1)
Sodium: 140 mmol/L (ref 135–145)
Total Bilirubin: 0.6 mg/dL (ref 0.3–1.2)
Total Protein: 6.2 g/dL — ABNORMAL LOW (ref 6.5–8.1)

## 2022-07-13 LAB — SAMPLE TO BLOOD BANK

## 2022-07-15 ENCOUNTER — Inpatient Hospital Stay: Payer: Medicare Other

## 2022-07-19 ENCOUNTER — Inpatient Hospital Stay: Payer: Medicare Other | Attending: Hematology

## 2022-07-19 ENCOUNTER — Other Ambulatory Visit: Payer: Self-pay

## 2022-07-19 ENCOUNTER — Inpatient Hospital Stay: Payer: Medicare Other

## 2022-07-19 DIAGNOSIS — K219 Gastro-esophageal reflux disease without esophagitis: Secondary | ICD-10-CM | POA: Insufficient documentation

## 2022-07-19 DIAGNOSIS — D469 Myelodysplastic syndrome, unspecified: Secondary | ICD-10-CM

## 2022-07-19 DIAGNOSIS — Z87442 Personal history of urinary calculi: Secondary | ICD-10-CM | POA: Insufficient documentation

## 2022-07-19 DIAGNOSIS — Z86010 Personal history of colonic polyps: Secondary | ICD-10-CM | POA: Insufficient documentation

## 2022-07-19 DIAGNOSIS — M25473 Effusion, unspecified ankle: Secondary | ICD-10-CM | POA: Insufficient documentation

## 2022-07-19 DIAGNOSIS — D539 Nutritional anemia, unspecified: Secondary | ICD-10-CM | POA: Diagnosis not present

## 2022-07-19 DIAGNOSIS — M129 Arthropathy, unspecified: Secondary | ICD-10-CM | POA: Insufficient documentation

## 2022-07-19 DIAGNOSIS — D649 Anemia, unspecified: Secondary | ICD-10-CM

## 2022-07-19 DIAGNOSIS — Z79899 Other long term (current) drug therapy: Secondary | ICD-10-CM | POA: Diagnosis not present

## 2022-07-19 DIAGNOSIS — Z87891 Personal history of nicotine dependence: Secondary | ICD-10-CM | POA: Diagnosis not present

## 2022-07-19 DIAGNOSIS — Z7982 Long term (current) use of aspirin: Secondary | ICD-10-CM | POA: Diagnosis not present

## 2022-07-19 DIAGNOSIS — E785 Hyperlipidemia, unspecified: Secondary | ICD-10-CM | POA: Diagnosis not present

## 2022-07-19 DIAGNOSIS — R351 Nocturia: Secondary | ICD-10-CM | POA: Insufficient documentation

## 2022-07-19 DIAGNOSIS — D751 Secondary polycythemia: Secondary | ICD-10-CM | POA: Insufficient documentation

## 2022-07-19 LAB — CBC WITH DIFFERENTIAL (CANCER CENTER ONLY)
Abs Immature Granulocytes: 1.9 10*3/uL — ABNORMAL HIGH (ref 0.00–0.07)
Basophils Absolute: 0.4 10*3/uL — ABNORMAL HIGH (ref 0.0–0.1)
Basophils Relative: 2 %
Eosinophils Absolute: 1.4 10*3/uL — ABNORMAL HIGH (ref 0.0–0.5)
Eosinophils Relative: 7 %
HCT: 19 % — ABNORMAL LOW (ref 39.0–52.0)
Hemoglobin: 6.3 g/dL — CL (ref 13.0–17.0)
Immature Granulocytes: 9 %
Lymphocytes Relative: 5 %
Lymphs Abs: 1 10*3/uL (ref 0.7–4.0)
MCH: 30.3 pg (ref 26.0–34.0)
MCHC: 33.2 g/dL (ref 30.0–36.0)
MCV: 91.3 fL (ref 80.0–100.0)
Monocytes Absolute: 1.3 10*3/uL — ABNORMAL HIGH (ref 0.1–1.0)
Monocytes Relative: 6 %
Neutro Abs: 14.4 10*3/uL — ABNORMAL HIGH (ref 1.7–7.7)
Neutrophils Relative %: 71 %
Platelet Count: 709 10*3/uL — ABNORMAL HIGH (ref 150–400)
RBC: 2.08 MIL/uL — ABNORMAL LOW (ref 4.22–5.81)
RDW: 16.1 % — ABNORMAL HIGH (ref 11.5–15.5)
WBC Count: 20.4 10*3/uL — ABNORMAL HIGH (ref 4.0–10.5)
nRBC: 0 % (ref 0.0–0.2)

## 2022-07-19 LAB — RETICULOCYTES
Immature Retic Fract: 11.8 % (ref 2.3–15.9)
RBC.: 2.08 MIL/uL — ABNORMAL LOW (ref 4.22–5.81)
Retic Count, Absolute: 23.1 10*3/uL (ref 19.0–186.0)
Retic Ct Pct: 1.1 % (ref 0.4–3.1)

## 2022-07-19 LAB — CMP (CANCER CENTER ONLY)
ALT: 12 U/L (ref 0–44)
AST: 11 U/L — ABNORMAL LOW (ref 15–41)
Albumin: 4.1 g/dL (ref 3.5–5.0)
Alkaline Phosphatase: 78 U/L (ref 38–126)
Anion gap: 7 (ref 5–15)
BUN: 19 mg/dL (ref 8–23)
CO2: 26 mmol/L (ref 22–32)
Calcium: 8.7 mg/dL — ABNORMAL LOW (ref 8.9–10.3)
Chloride: 104 mmol/L (ref 98–111)
Creatinine: 1.2 mg/dL (ref 0.61–1.24)
GFR, Estimated: >60 mL/min (ref >60)
Glucose, Bld: 142 mg/dL — ABNORMAL HIGH (ref 70–99)
Potassium: 4.6 mmol/L (ref 3.5–5.1)
Sodium: 137 mmol/L (ref 135–145)
Total Bilirubin: 0.6 mg/dL (ref 0.3–1.2)
Total Protein: 5.9 g/dL — ABNORMAL LOW (ref 6.5–8.1)

## 2022-07-19 LAB — PREPARE RBC (CROSSMATCH)

## 2022-07-19 LAB — SAMPLE TO BLOOD BANK

## 2022-07-19 LAB — IRON AND IRON BINDING CAPACITY (CC-WL,HP ONLY)
Iron: 198 ug/dL — ABNORMAL HIGH (ref 45–182)
Saturation Ratios: 94 % — ABNORMAL HIGH (ref 17.9–39.5)
TIBC: 210 ug/dL — ABNORMAL LOW (ref 250–450)
UIBC: 12 ug/dL — ABNORMAL LOW (ref 117–376)

## 2022-07-19 LAB — FERRITIN: Ferritin: 933 ng/mL — ABNORMAL HIGH (ref 24–336)

## 2022-07-19 MED ORDER — SODIUM CHLORIDE 0.9% FLUSH: 10.0000 mL | INTRAVENOUS | Status: DC | PRN

## 2022-07-19 MED ORDER — METHYLPREDNISOLONE SODIUM SUCC 40 MG IJ SOLR
40.0000 mg | Freq: Once | INTRAMUSCULAR | Status: AC
  Administered 2022-07-19: 40 mg via INTRAVENOUS
  Filled 2022-07-19: qty 1

## 2022-07-19 MED ORDER — SODIUM CHLORIDE 0.9 % IV SOLN: Freq: Once | INTRAVENOUS | Status: AC

## 2022-07-19 MED ORDER — ACETAMINOPHEN 325 MG PO TABS
650.0000 mg | ORAL_TABLET | Freq: Once | ORAL | Status: AC
  Administered 2022-07-19: 650 mg via ORAL
  Filled 2022-07-19: qty 2

## 2022-07-19 NOTE — Patient Instructions (Signed)

## 2022-07-20 LAB — TYPE AND SCREEN
ABO/RH(D): A POS
Antibody Screen: NEGATIVE
Unit division: 0

## 2022-07-20 LAB — BPAM RBC
Blood Product Expiration Date: 202310262359
ISSUE DATE / TIME: 202310021523
Unit Type and Rh: 6200

## 2022-07-21 ENCOUNTER — Other Ambulatory Visit: Payer: Self-pay | Admitting: Hematology

## 2022-07-21 ENCOUNTER — Inpatient Hospital Stay: Payer: Medicare Other

## 2022-07-21 ENCOUNTER — Inpatient Hospital Stay (HOSPITAL_BASED_OUTPATIENT_CLINIC_OR_DEPARTMENT_OTHER): Payer: Medicare Other | Admitting: Hematology

## 2022-07-21 VITALS — BP 116/60 | HR 67 | Temp 99.0°F | Resp 18 | Ht 67.0 in | Wt 165.4 lb

## 2022-07-21 DIAGNOSIS — D649 Anemia, unspecified: Secondary | ICD-10-CM

## 2022-07-21 DIAGNOSIS — Z7982 Long term (current) use of aspirin: Secondary | ICD-10-CM | POA: Diagnosis not present

## 2022-07-21 DIAGNOSIS — M25473 Effusion, unspecified ankle: Secondary | ICD-10-CM | POA: Diagnosis not present

## 2022-07-21 DIAGNOSIS — Z79899 Other long term (current) drug therapy: Secondary | ICD-10-CM | POA: Diagnosis not present

## 2022-07-21 DIAGNOSIS — Z86010 Personal history of colonic polyps: Secondary | ICD-10-CM | POA: Diagnosis not present

## 2022-07-21 DIAGNOSIS — K219 Gastro-esophageal reflux disease without esophagitis: Secondary | ICD-10-CM | POA: Diagnosis not present

## 2022-07-21 DIAGNOSIS — D469 Myelodysplastic syndrome, unspecified: Secondary | ICD-10-CM | POA: Diagnosis not present

## 2022-07-21 DIAGNOSIS — E785 Hyperlipidemia, unspecified: Secondary | ICD-10-CM | POA: Diagnosis not present

## 2022-07-21 DIAGNOSIS — D751 Secondary polycythemia: Secondary | ICD-10-CM | POA: Diagnosis not present

## 2022-07-21 DIAGNOSIS — Z87891 Personal history of nicotine dependence: Secondary | ICD-10-CM | POA: Diagnosis not present

## 2022-07-21 DIAGNOSIS — Z87442 Personal history of urinary calculi: Secondary | ICD-10-CM | POA: Diagnosis not present

## 2022-07-21 DIAGNOSIS — D539 Nutritional anemia, unspecified: Secondary | ICD-10-CM | POA: Diagnosis not present

## 2022-07-21 DIAGNOSIS — M129 Arthropathy, unspecified: Secondary | ICD-10-CM | POA: Diagnosis not present

## 2022-07-21 MED ORDER — LUSPATERCEPT-AAMT 75 MG ~~LOC~~ SOLR
1.3300 mg/kg | Freq: Once | SUBCUTANEOUS | Status: AC
  Administered 2022-07-21: 100 mg via SUBCUTANEOUS
  Filled 2022-07-21: qty 1.5

## 2022-07-21 NOTE — Progress Notes (Signed)
HEMATOLOGY/ONCOLOGY PROGRESS NOTE:   Date of Service: .07/21/2022    Patient Care Team: Lurline Del, DO as PCP - General (Family Medicine)  CHIEF COMPLAINTS:  Follow-up for continued evaluation and management of JAK2 positive myeloproliferative neoplasm/MDS  INTERVAL HISTORY:  [name] is a 76 y.o. male here for continued evaluation and management of his MPN/MDS. Patient was doing well at his last visit with me on 06/23/22.  He started Luspatercept and is tolerating it well with no prohibitive toxicities. He has had 2 doses thus far.  He has received 2 RBC transfusions since our last visit. His most recent transfusion was on 07/19/22.  Patient feels that his symptoms have improved over the last x1 week. He denies any light headedness, dizziness, urine color changes, fever, chills, night sweats, back pain.  He reports frequent ankle swelling which is controlled with compression stockings. He states that the swelling seems to be dependent in nature and is resolved by the time he wakes up in the mornings.  He reports nocturia but denies any known Hx of prostate problems. He denies any family history of prostate cancer.  He states that his PCP has advised him to consider starting Metformin given his elevated A1c. He would like to try dietary changes before starting the medication.  He recently went on the [employer] for [employer].  MEDICAL HISTORY:  Past Medical History:  Diagnosis Date   Arthritis of right knee    Cervicalgia    Chest discomfort    normal stress echo   Chest pain    Colon polyps    Dyslipidemia    Fluttering sensation of heart    GERD without esophagitis    Hematochezia    Hyperglycemia    Hyperlipidemia    Internal hemorrhoids    Kidney stones    Male erectile dysfunction, unspecified    Mixed dyslipidemia    Mixed hyperlipidemia    Ocular migraine    PAC (premature atrial contraction)    Personal history of colonic polyps     Prediabetes    Residual hemorrhoidal skin tags    Unspecified hemorrhoids     SURGICAL HISTORY: Past Surgical History:  Procedure Laterality Date   BONE MARROW BIOPSY     KIDNEY STONE SURGERY     retrieval    SOCIAL HISTORY: Social History   Socioeconomic History   Marital status: Married    Spouse name: Not on file   Number of children: Not on file   Years of education: Not on file   Highest education level: Not on file  Occupational History   Not on file  Tobacco Use   Smoking status: Former    Types: Cigarettes   Smokeless tobacco: Never  Substance and Sexual Activity   Alcohol use: Yes    Alcohol/week: 1.0 standard drink of alcohol    Types: 1 Cans of beer per week   Drug use: Not on file   Sexual activity: Not on file  Other Topics Concern   Not on file  Social History Narrative   Not on file   Social Determinants of Health   Financial Resource Strain: Not on file  Food Insecurity: Not on file  Transportation Needs: Not on file  Physical Activity: Not on file  Stress: Not on file  Social Connections: Not on file  Intimate Partner Violence: Not on file    FAMILY HISTORY: Family History  Problem Relation Age of Onset   Stroke Brother  Congestive Heart Failure Brother     ALLERGIES:  has No Known Allergies.  MEDICATIONS:  Current Outpatient Medications  Medication Sig Dispense Refill   aspirin EC 81 MG tablet Take 81 mg by mouth daily. Swallow whole.     B Complex Vitamins (B COMPLEX PO) Take 1 tablet by mouth daily.     Cholecalciferol 50 MCG (2000 UT) TABS Take 2,000 Units by mouth daily.     docusate sodium (COLACE) 100 MG capsule Take 100 mg by mouth 2 (two) times daily.     FIBER PO Take 1 capsule by mouth daily. Unknown strength     fish oil-omega-3 fatty acids 1000 MG capsule Take 1 g by mouth 2 (two) times daily. Strength 326m/1500mg     Flaxseed, Linseed, (FLAXSEED OIL) 1000 MG CAPS Take by mouth.     GLUCOSAMINE CHONDROITIN MSM PO  Take 1 tablet by mouth daily. Strength 1500/1500     Misc Natural Products (PROSTATE SUPPORT PO) Take 1 capsule by mouth daily. Unknown strength     Multiple Vitamin (MULTIVITAMIN) tablet Take 1 tablet by mouth daily. Unknown strength     psyllium (METAMUCIL) 58.6 % powder Take 1 packet by mouth daily.     Turmeric (QC TUMERIC COMPLEX PO) Take 750 mg by mouth daily. Unknown strenght     vitamin C (ASCORBIC ACID) 250 MG tablet Take 500 mg by mouth daily.     No current facility-administered medications for this visit.    REVIEW OF SYSTEMS:   10 Point review of Systems was done is negative except as noted above.   PHYSICAL EXAMINATION: .BP 116/60 (BP Location: Left Arm, Patient Position: Sitting)   Pulse 67   Temp 99 F (37.2 C) (Tympanic)   Resp 18   Ht _0  (1.702 m)   Wt 165 lb 6.4 oz (75 kg)   SpO2 100%   BMI 25.91 kg/m  NAD GENERAL:alert, in no acute distress and comfortable SKIN: no acute rashes, no significant lesions EYES: conjunctiva are pink and non-injected, sclera anicteric NECK: supple, no JVD LYMPH:  no palpable lymphadenopathy in the cervical, axillary or inguinal regions LUNGS: clear to auscultation b/l with normal respiratory effort HEART: regular rate & rhythm ABDOMEN:  normoactive bowel sounds , non tender, not distended. Extremity: no pedal edema PSYCH: alert & oriented x 3 with fluent speech NEURO: no focal motor/sensory deficits  LABORATORY DATA:  I have reviewed the data as listed  .    Latest Ref Rng & Units 07/19/2022    1:33 PM 07/13/2022    1:50 PM 07/09/2022    2:58 PM  CBC  WBC 4.0 - 10.5 K/uL 20.4  19.7  18.1   Hemoglobin 13.0 - 17.0 g/dL 6.3  7.3  5.4   Hematocrit 39.0 - 52.0 % 19.0  21.8  16.1   Platelets 150 - 400 K/uL 709  609  679    Iron/TIBC/Ferritin/ %Sat    Component Value Date/Time   IRON 198 (H) 07/19/2022 1344   TIBC 210 (L) 07/19/2022 1344   FERRITIN 933 (H) 07/19/2022 1343   IRONPCTSAT 94 (H) 07/19/2022 1344  .     Latest Ref Rng & Units 07/19/2022    1:33 PM 07/13/2022    1:50 PM 07/09/2022    2:58 PM  CMP  Glucose 70 - 99 mg/dL 142  140  154   BUN 8 - 23 mg/dL _1 Creatinine 0.61 - 1.24 mg/dL 1.20  1.22  1.04   Sodium 135 - 145 mmol/L 137  140  139   Potassium 3.5 - 5.1 mmol/L 4.6  4.8  4.5   Chloride 98 - 111 mmol/L 104  108  108   CO2 22 - 32 mmol/L _0 Calcium 8.9 - 10.3 mg/dL 8.7  8.8  8.6   Total Protein 6.5 - 8.1 g/dL 5.9  6.2  5.7   Total Bilirubin 0.3 - 1.2 mg/dL 0.6  0.6  0.5   Alkaline Phos 38 - 126 U/L 78  68  72   AST 15 - 41 U/L _1 ALT 0 - 44 U/L _2 . Lab Results  Component Value Date   LDH 110 01/04/2022    02/23/2021 BCR ABL    02/23/2021 JAK2   Surgical Pathology  CASE: [id number]  PATIENT: Quantay Petti  Bone Marrow Report      Clinical History: MPN with progressive anemia  (BH)  DIAGNOSIS:   BONE MARROW, ASPIRATE, CLOT, CORE:  -Hypercellular bone marrow with features of myeloid neoplasm  -See comment   PERIPHERAL BLOOD:  -Macrocytic anemia  -Leukocytosis  -Thrombocytosis   COMMENT:   The bone marrow/peripheral blood show persistent involvement by  previously known myeloid neoplasm.  There is a myeloproliferative  component as supported by previous JAK2 positivity.  However, there are  also dyspoietic changes primarily involving the megakaryocytic cell line  with numerous hypolobated/unilobated forms in addition to  dysgranulopoiesis to a lesser extent.  This is associated with  eosinophilia.  It is not entirely clear whether the overall findings  represent a myeloproliferative neoplasm with treatment related changes  or represent a primary myeloproliferative/myelodysplastic neoplasm  including but not limited to myeloid neoplasms with eosinophilia.  Correlation with cytogenetic and FISH studies strongly recommended.      RADIOGRAPHIC STUDIES: I have personally reviewed the radiological images as listed  and agreed with the findings in the report. No results found.   ASSESSMENT & PLAN:    #1 JAK2-positive myeloproliferative neoplasm-primarily presenting with thrombocytosis and associated MDS causing refractory anemia -No polycythemia.  -Mild leukocytosis.  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 Has grade 1 out of 3 reticulin fibrosis.  Uncertain if this is primary or secondary. -LDH has remained within normal limits. 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  PLAN: -Labs were reviewed in detail. -Hgb dropped from 7.3 to 6.3 from 9/26 to 07/19/22; WBC at 20.4k, elevated platelets of 709k. CMP stable. -His recent labs recorded vitals were reviewed and the fact that he received 2 units of PRBC support since our last visit on 06/23/22. -We shall continue monitoring labs every 7-10 days and provide transfusion support as needed with PRBCs to maintain hemoglobin more than 7.5 or if symptomatic. -Increase dose of Luspatercept to 1.32m/kg today. We will likely increase to next dose in 6 weeks if tolerated well. -May conside rVidaza if the Luspatercept is not effective at reducing his transfusion dependence. -He will be scheduled for continued monitoring and transfusion support as needed. Continue PRBC transfusion as needed for hemoglobin less than 7.5 or if symptomatic.  -May consider iron chelation therapy if ferritin levels keep increasing. -We reviewed that he may be a candidate for port placement but he would like to consider this for a while before proceeding with this option.  Follow-up Labs with 2 units of PRBC if needed every 10 days. Continue Luspatercept per  orders    The total time spent in the appointment was 32 minutes* .  All of the patient's questions were answered with apparent satisfaction. The patient knows to call the clinic with any problems, questions or concerns.   Sullivan Lone MD MS AAHIVMS [facility] Hematology/Oncology  Physician [facility]  .*Total Encounter Time as defined by the Centers for Medicare and Medicaid Services includes, in addition to the face-to-face time of a patient visit (documented in the note above) non-face-to-face time: obtaining and reviewing outside history, ordering and reviewing medications, tests or procedures, care coordination (communications with other health care professionals or caregivers) and documentation in the medical record.  I, Eugene Gavia, am acting as scribe for Dr. Sullivan Lone, MD  .I have reviewed the above documentation for accuracy and completeness, and I agree with the above. Brunetta Genera MD

## 2022-07-27 ENCOUNTER — Encounter: Payer: Self-pay | Admitting: Hematology

## 2022-07-29 ENCOUNTER — Other Ambulatory Visit: Payer: Self-pay

## 2022-07-29 ENCOUNTER — Inpatient Hospital Stay: Payer: Medicare Other

## 2022-07-29 DIAGNOSIS — D751 Secondary polycythemia: Secondary | ICD-10-CM | POA: Diagnosis not present

## 2022-07-29 DIAGNOSIS — Z79899 Other long term (current) drug therapy: Secondary | ICD-10-CM | POA: Diagnosis not present

## 2022-07-29 DIAGNOSIS — D539 Nutritional anemia, unspecified: Secondary | ICD-10-CM | POA: Diagnosis not present

## 2022-07-29 DIAGNOSIS — Z87891 Personal history of nicotine dependence: Secondary | ICD-10-CM | POA: Diagnosis not present

## 2022-07-29 DIAGNOSIS — D469 Myelodysplastic syndrome, unspecified: Secondary | ICD-10-CM

## 2022-07-29 DIAGNOSIS — Z7982 Long term (current) use of aspirin: Secondary | ICD-10-CM | POA: Diagnosis not present

## 2022-07-29 DIAGNOSIS — Z87442 Personal history of urinary calculi: Secondary | ICD-10-CM | POA: Diagnosis not present

## 2022-07-29 DIAGNOSIS — M25473 Effusion, unspecified ankle: Secondary | ICD-10-CM | POA: Diagnosis not present

## 2022-07-29 DIAGNOSIS — M129 Arthropathy, unspecified: Secondary | ICD-10-CM | POA: Diagnosis not present

## 2022-07-29 DIAGNOSIS — Z86010 Personal history of colonic polyps: Secondary | ICD-10-CM | POA: Diagnosis not present

## 2022-07-29 DIAGNOSIS — E785 Hyperlipidemia, unspecified: Secondary | ICD-10-CM | POA: Diagnosis not present

## 2022-07-29 DIAGNOSIS — K219 Gastro-esophageal reflux disease without esophagitis: Secondary | ICD-10-CM | POA: Diagnosis not present

## 2022-07-29 LAB — CBC WITH DIFFERENTIAL (CANCER CENTER ONLY)
Abs Immature Granulocytes: 1.9 10*3/uL — ABNORMAL HIGH (ref 0.00–0.07)
Band Neutrophils: 2 %
Basophils Absolute: 0 10*3/uL (ref 0.0–0.1)
Basophils Relative: 0 %
Eosinophils Absolute: 0.7 10*3/uL — ABNORMAL HIGH (ref 0.0–0.5)
Eosinophils Relative: 3 %
HCT: 17.5 % — ABNORMAL LOW (ref 39.0–52.0)
Hemoglobin: 5.9 g/dL — CL (ref 13.0–17.0)
Lymphocytes Relative: 0 %
Lymphs Abs: 0 10*3/uL — ABNORMAL LOW (ref 0.7–4.0)
MCH: 29.9 pg (ref 26.0–34.0)
MCHC: 33.7 g/dL (ref 30.0–36.0)
MCV: 88.8 fL (ref 80.0–100.0)
Metamyelocytes Relative: 8 %
Monocytes Absolute: 0.5 10*3/uL (ref 0.1–1.0)
Monocytes Relative: 2 %
Neutro Abs: 20.7 10*3/uL — ABNORMAL HIGH (ref 1.7–7.7)
Neutrophils Relative %: 85 %
Platelet Count: 649 10*3/uL — ABNORMAL HIGH (ref 150–400)
RBC: 1.97 MIL/uL — ABNORMAL LOW (ref 4.22–5.81)
RDW: 16.2 % — ABNORMAL HIGH (ref 11.5–15.5)
WBC Count: 23.8 10*3/uL — ABNORMAL HIGH (ref 4.0–10.5)
nRBC: 0 % (ref 0.0–0.2)

## 2022-07-29 LAB — CMP (CANCER CENTER ONLY)
ALT: 12 U/L (ref 0–44)
AST: 10 U/L — ABNORMAL LOW (ref 15–41)
Albumin: 4 g/dL (ref 3.5–5.0)
Alkaline Phosphatase: 81 U/L (ref 38–126)
Anion gap: 6 (ref 5–15)
BUN: 26 mg/dL — ABNORMAL HIGH (ref 8–23)
CO2: 26 mmol/L (ref 22–32)
Calcium: 8.7 mg/dL — ABNORMAL LOW (ref 8.9–10.3)
Chloride: 105 mmol/L (ref 98–111)
Creatinine: 1.16 mg/dL (ref 0.61–1.24)
GFR, Estimated: >60 mL/min (ref >60)
Glucose, Bld: 131 mg/dL — ABNORMAL HIGH (ref 70–99)
Potassium: 5.2 mmol/L — ABNORMAL HIGH (ref 3.5–5.1)
Sodium: 137 mmol/L (ref 135–145)
Total Bilirubin: 0.6 mg/dL (ref 0.3–1.2)
Total Protein: 6.3 g/dL — ABNORMAL LOW (ref 6.5–8.1)

## 2022-07-29 LAB — PREPARE RBC (CROSSMATCH)

## 2022-07-29 MED ORDER — ACETAMINOPHEN 325 MG PO TABS
650.0000 mg | ORAL_TABLET | Freq: Once | ORAL | Status: AC
  Administered 2022-07-29: 650 mg via ORAL
  Filled 2022-07-29: qty 2

## 2022-07-29 MED ORDER — METHYLPREDNISOLONE SODIUM SUCC 40 MG IJ SOLR
40.0000 mg | Freq: Once | INTRAMUSCULAR | Status: AC
  Administered 2022-07-29: 40 mg via INTRAVENOUS
  Filled 2022-07-29: qty 1

## 2022-07-29 NOTE — Patient Instructions (Signed)

## 2022-07-30 LAB — SAMPLE TO BLOOD BANK

## 2022-07-30 LAB — TYPE AND SCREEN
ABO/RH(D): A POS
Antibody Screen: NEGATIVE
Unit division: 0
Unit division: 0

## 2022-07-30 LAB — BPAM RBC
Blood Product Expiration Date: 202311082359
Blood Product Expiration Date: 202311082359
ISSUE DATE / TIME: 202310121348
ISSUE DATE / TIME: 202310121545
Unit Type and Rh: 6200
Unit Type and Rh: 6200

## 2022-07-31 ENCOUNTER — Other Ambulatory Visit: Payer: Self-pay

## 2022-08-02 ENCOUNTER — Telehealth: Payer: Self-pay | Admitting: Hematology

## 2022-08-02 NOTE — Telephone Encounter (Signed)
Scheduled appointment per 10/13 scheduling message. Patient is aware.

## 2022-08-06 ENCOUNTER — Other Ambulatory Visit: Payer: Self-pay

## 2022-08-10 ENCOUNTER — Other Ambulatory Visit: Payer: Self-pay

## 2022-08-11 ENCOUNTER — Inpatient Hospital Stay: Payer: Medicare Other

## 2022-08-11 ENCOUNTER — Other Ambulatory Visit: Payer: Self-pay

## 2022-08-11 ENCOUNTER — Inpatient Hospital Stay (HOSPITAL_BASED_OUTPATIENT_CLINIC_OR_DEPARTMENT_OTHER): Payer: Medicare Other | Admitting: Hematology

## 2022-08-11 VITALS — BP 118/68 | HR 72 | Temp 98.0°F | Resp 18

## 2022-08-11 DIAGNOSIS — Z87442 Personal history of urinary calculi: Secondary | ICD-10-CM | POA: Diagnosis not present

## 2022-08-11 DIAGNOSIS — D539 Nutritional anemia, unspecified: Secondary | ICD-10-CM | POA: Diagnosis not present

## 2022-08-11 DIAGNOSIS — Z7982 Long term (current) use of aspirin: Secondary | ICD-10-CM | POA: Diagnosis not present

## 2022-08-11 DIAGNOSIS — E785 Hyperlipidemia, unspecified: Secondary | ICD-10-CM | POA: Diagnosis not present

## 2022-08-11 DIAGNOSIS — D649 Anemia, unspecified: Secondary | ICD-10-CM | POA: Diagnosis not present

## 2022-08-11 DIAGNOSIS — D751 Secondary polycythemia: Secondary | ICD-10-CM | POA: Diagnosis not present

## 2022-08-11 DIAGNOSIS — K219 Gastro-esophageal reflux disease without esophagitis: Secondary | ICD-10-CM | POA: Diagnosis not present

## 2022-08-11 DIAGNOSIS — Z79899 Other long term (current) drug therapy: Secondary | ICD-10-CM | POA: Diagnosis not present

## 2022-08-11 DIAGNOSIS — D469 Myelodysplastic syndrome, unspecified: Secondary | ICD-10-CM

## 2022-08-11 DIAGNOSIS — M129 Arthropathy, unspecified: Secondary | ICD-10-CM | POA: Diagnosis not present

## 2022-08-11 DIAGNOSIS — M25473 Effusion, unspecified ankle: Secondary | ICD-10-CM | POA: Diagnosis not present

## 2022-08-11 DIAGNOSIS — Z87891 Personal history of nicotine dependence: Secondary | ICD-10-CM | POA: Diagnosis not present

## 2022-08-11 DIAGNOSIS — Z86010 Personal history of colonic polyps: Secondary | ICD-10-CM | POA: Diagnosis not present

## 2022-08-11 LAB — CMP (CANCER CENTER ONLY)
ALT: 14 U/L (ref 0–44)
AST: 12 U/L — ABNORMAL LOW (ref 15–41)
Albumin: 4 g/dL (ref 3.5–5.0)
Alkaline Phosphatase: 82 U/L (ref 38–126)
Anion gap: 7 (ref 5–15)
BUN: 20 mg/dL (ref 8–23)
CO2: 26 mmol/L (ref 22–32)
Calcium: 8.7 mg/dL — ABNORMAL LOW (ref 8.9–10.3)
Chloride: 106 mmol/L (ref 98–111)
Creatinine: 1.15 mg/dL (ref 0.61–1.24)
GFR, Estimated: >60 mL/min (ref >60)
Glucose, Bld: 144 mg/dL — ABNORMAL HIGH (ref 70–99)
Potassium: 5.3 mmol/L — ABNORMAL HIGH (ref 3.5–5.1)
Sodium: 139 mmol/L (ref 135–145)
Total Bilirubin: 0.6 mg/dL (ref 0.3–1.2)
Total Protein: 6.2 g/dL — ABNORMAL LOW (ref 6.5–8.1)

## 2022-08-11 LAB — CBC WITH DIFFERENTIAL (CANCER CENTER ONLY)
Abs Immature Granulocytes: 1.84 10*3/uL — ABNORMAL HIGH (ref 0.00–0.07)
Basophils Absolute: 0.4 10*3/uL — ABNORMAL HIGH (ref 0.0–0.1)
Basophils Relative: 2 %
Eosinophils Absolute: 1.2 10*3/uL — ABNORMAL HIGH (ref 0.0–0.5)
Eosinophils Relative: 6 %
HCT: 18.8 % — ABNORMAL LOW (ref 39.0–52.0)
Hemoglobin: 6.3 g/dL — CL (ref 13.0–17.0)
Immature Granulocytes: 10 %
Lymphocytes Relative: 4 %
Lymphs Abs: 0.8 10*3/uL (ref 0.7–4.0)
MCH: 30.1 pg (ref 26.0–34.0)
MCHC: 33.5 g/dL (ref 30.0–36.0)
MCV: 90 fL (ref 80.0–100.0)
Monocytes Absolute: 1.2 10*3/uL — ABNORMAL HIGH (ref 0.1–1.0)
Monocytes Relative: 7 %
Neutro Abs: 12.9 10*3/uL — ABNORMAL HIGH (ref 1.7–7.7)
Neutrophils Relative %: 71 %
Platelet Count: 622 10*3/uL — ABNORMAL HIGH (ref 150–400)
RBC: 2.09 MIL/uL — ABNORMAL LOW (ref 4.22–5.81)
RDW: 15.9 % — ABNORMAL HIGH (ref 11.5–15.5)
WBC Count: 18.3 10*3/uL — ABNORMAL HIGH (ref 4.0–10.5)
nRBC: 0 % (ref 0.0–0.2)

## 2022-08-11 LAB — SAMPLE TO BLOOD BANK

## 2022-08-11 LAB — PREPARE RBC (CROSSMATCH)

## 2022-08-11 MED ORDER — SODIUM CHLORIDE 0.9% IV SOLUTION
250.0000 mL | Freq: Once | INTRAVENOUS | Status: AC
  Administered 2022-08-11: 250 mL via INTRAVENOUS

## 2022-08-11 MED ORDER — METHYLPREDNISOLONE SODIUM SUCC 40 MG IJ SOLR
40.0000 mg | Freq: Once | INTRAMUSCULAR | Status: AC
  Administered 2022-08-11: 40 mg via INTRAVENOUS
  Filled 2022-08-11: qty 1

## 2022-08-11 MED ORDER — ACETAMINOPHEN 325 MG PO TABS
650.0000 mg | ORAL_TABLET | Freq: Once | ORAL | Status: AC
  Administered 2022-08-11: 650 mg via ORAL
  Filled 2022-08-11: qty 2

## 2022-08-11 MED ORDER — LUSPATERCEPT-AAMT 75 MG ~~LOC~~ SOLR
1.3300 mg/kg | Freq: Once | SUBCUTANEOUS | Status: DC
  Filled 2022-08-11 (×2): qty 2

## 2022-08-11 MED ORDER — LUSPATERCEPT-AAMT 75 MG ~~LOC~~ SOLR
1.3300 mg/kg | Freq: Once | SUBCUTANEOUS | Status: AC
  Administered 2022-08-11: 100 mg via SUBCUTANEOUS
  Filled 2022-08-11: qty 1.5

## 2022-08-11 NOTE — Progress Notes (Signed)
HEMATOLOGY/ONCOLOGY PROGRESS NOTE:   Date of Service: 08/11/22  Patient Care Team: Lurline Del, DO as PCP - General (Family Medicine)  CHIEF COMPLAINTS:  Follow-up for continued evaluation and management of JAK2 positive myeloproliferative neoplasm/MDS  INTERVAL HISTORY:  [name] is a 76 y.o. male here for continued evaluation and management of his MPN/MDS. Patient was last seen by me on 07/21/2022 and was doing well without any toxicity with Luspatercept.   He is here today with his wife and to start his cycle 4 day 1 of Luspatercept. This is going to be is 2nd time with his increased dosage. This month he has had 3 blood transfusions. He notes that he feels better when the blood unit has later expiration date. He complains of mild fatigue after his last transfusion.   He reports he was being doing well since his last visit. He had questions if there are any new treatment trials for his MPN/MDS.   He is unsure if he wants to get a port as of right now because he is slightly worried about infection.   His wife reports they recently went to the beach and the patient felt good there.   He denies any infection issues, abnormal bleeding, bone pain, back pain, chest pain, abdominal pain, and leg swelling. He does note that he has some leg swelling if he seats for a long time. He is currently not taking any water pill.   He complains of LLQ pain, but he says it is due to digestion. He complains that he has been waking up more frequently at night to use bathroom. He reports he has scheduled appointment with Urologist for frequency issue. He reports that he does not have any problems with prostate.    MEDICAL HISTORY:  Past Medical History:  Diagnosis Date   Arthritis of right knee    Cervicalgia    Chest discomfort    normal stress echo   Chest pain    Colon polyps    Dyslipidemia    Fluttering sensation of heart    GERD without esophagitis    Hematochezia     Hyperglycemia    Hyperlipidemia    Internal hemorrhoids    Kidney stones    Male erectile dysfunction, unspecified    Mixed dyslipidemia    Mixed hyperlipidemia    Ocular migraine    PAC (premature atrial contraction)    Personal history of colonic polyps    Prediabetes    Residual hemorrhoidal skin tags    Unspecified hemorrhoids     SURGICAL HISTORY: Past Surgical History:  Procedure Laterality Date   BONE MARROW BIOPSY     KIDNEY STONE SURGERY     retrieval    SOCIAL HISTORY: Social History   Socioeconomic History   Marital status: Married    Spouse name: Not on file   Number of children: Not on file   Years of education: Not on file   Highest education level: Not on file  Occupational History   Not on file  Tobacco Use   Smoking status: Former    Types: Cigarettes   Smokeless tobacco: Never  Substance and Sexual Activity   Alcohol use: Yes    Alcohol/week: 1.0 standard drink of alcohol    Types: 1 Cans of beer per week   Drug use: Not on file   Sexual activity: Not on file  Other Topics Concern   Not on file  Social History Narrative   Not on  file   Social Determinants of Health   Financial Resource Strain: Not on file  Food Insecurity: Not on file  Transportation Needs: Not on file  Physical Activity: Not on file  Stress: Not on file  Social Connections: Not on file  Intimate Partner Violence: Not on file    FAMILY HISTORY: Family History  Problem Relation Age of Onset   Stroke Brother    Congestive Heart Failure Brother     ALLERGIES:  has No Known Allergies.  MEDICATIONS:  Current Outpatient Medications  Medication Sig Dispense Refill   aspirin EC 81 MG tablet Take 81 mg by mouth daily. Swallow whole.     B Complex Vitamins (B COMPLEX PO) Take 1 tablet by mouth daily.     Cholecalciferol 50 MCG (2000 UT) TABS Take 2,000 Units by mouth daily.     docusate sodium (COLACE) 100 MG capsule Take 100 mg by mouth 2 (two) times daily.      FIBER PO Take 1 capsule by mouth daily. Unknown strength     fish oil-omega-3 fatty acids 1000 MG capsule Take 1 g by mouth 2 (two) times daily. Strength 379m/1500mg     Flaxseed, Linseed, (FLAXSEED OIL) 1000 MG CAPS Take by mouth.     GLUCOSAMINE CHONDROITIN MSM PO Take 1 tablet by mouth daily. Strength 1500/1500     Misc Natural Products (PROSTATE SUPPORT PO) Take 1 capsule by mouth daily. Unknown strength     Multiple Vitamin (MULTIVITAMIN) tablet Take 1 tablet by mouth daily. Unknown strength     psyllium (METAMUCIL) 58.6 % powder Take 1 packet by mouth daily.     Turmeric (QC TUMERIC COMPLEX PO) Take 750 mg by mouth daily. Unknown strenght     vitamin C (ASCORBIC ACID) 250 MG tablet Take 500 mg by mouth daily.     No current facility-administered medications for this visit.    REVIEW OF SYSTEMS:   10 Point review of Systems was done is negative except as noted above.   PHYSICAL EXAMINATION: .BP (!) 114/58 (BP Location: Left Arm, Patient Position: Sitting) Comment: nurse notified  Pulse 69   Temp 98.6 F (37 C) (Temporal)   Resp 17   Ht '5\' 7"'  (1.702 m)   Wt 167 lb (75.8 kg)   SpO2 99%   BMI 26.16 kg/m  NAD GENERAL:alert, in no acute distress and comfortable SKIN: no acute rashes, no significant lesions EYES: conjunctiva are pink and non-injected, sclera anicteric NECK: supple, no JVD LYMPH:  no palpable lymphadenopathy in the cervical, axillary or inguinal regions LUNGS: clear to auscultation b/l with normal respiratory effort HEART: regular rate & rhythm ABDOMEN:  normoactive bowel sounds , non tender, not distended. Extremity: no pedal edema PSYCH: alert & oriented x 3 with fluent speech NEURO: no focal motor/sensory deficits  LABORATORY DATA:  I have reviewed the data as listed  .    Latest Ref Rng & Units 08/11/2022    9:33 AM 07/29/2022   11:57 AM 07/19/2022    1:33 PM  CBC  WBC 4.0 - 10.5 K/uL 18.3  23.8  20.4   Hemoglobin 13.0 - 17.0 g/dL 6.3  5.9  6.3    Hematocrit 39.0 - 52.0 % 18.8  17.5  19.0   Platelets 150 - 400 K/uL 622  649  709    Iron/TIBC/Ferritin/ %Sat    Component Value Date/Time   IRON 198 (H) 07/19/2022 1344   TIBC 210 (L) 07/19/2022 1344   FERRITIN 933 (H) 07/19/2022  1343   IRONPCTSAT 94 (H) 07/19/2022 1344  .    Latest Ref Rng & Units 08/11/2022    9:33 AM 07/29/2022   11:57 AM 07/19/2022    1:33 PM  CMP  Glucose 70 - 99 mg/dL 144  131  142   BUN 8 - 23 mg/dL '20  26  19   ' Creatinine 0.61 - 1.24 mg/dL 1.15  1.16  1.20   Sodium 135 - 145 mmol/L 139  137  137   Potassium 3.5 - 5.1 mmol/L 5.3  5.2  4.6   Chloride 98 - 111 mmol/L 106  105  104   CO2 22 - 32 mmol/L '26  26  26   ' Calcium 8.9 - 10.3 mg/dL 8.7  8.7  8.7   Total Protein 6.5 - 8.1 g/dL 6.2  6.3  5.9   Total Bilirubin 0.3 - 1.2 mg/dL 0.6  0.6  0.6   Alkaline Phos 38 - 126 U/L 82  81  78   AST 15 - 41 U/L '12  10  11   ' ALT 0 - 44 U/L '14  12  12    ' . Lab Results  Component Value Date   LDH 110 01/04/2022    02/23/2021 BCR ABL    02/23/2021 JAK2   Surgical Pathology  CASE: [id number]  PATIENT: Fernando Lee  Bone Marrow Report      Clinical History: MPN with progressive anemia  (BH)  DIAGNOSIS:   BONE MARROW, ASPIRATE, CLOT, CORE:  -Hypercellular bone marrow with features of myeloid neoplasm  -See comment   PERIPHERAL BLOOD:  -Macrocytic anemia  -Leukocytosis  -Thrombocytosis   COMMENT:   The bone marrow/peripheral blood show persistent involvement by  previously known myeloid neoplasm.  There is a myeloproliferative  component as supported by previous JAK2 positivity.  However, there are  also dyspoietic changes primarily involving the megakaryocytic cell line  with numerous hypolobated/unilobated forms in addition to  dysgranulopoiesis to a lesser extent.  This is associated with  eosinophilia.  It is not entirely clear whether the overall findings  represent a myeloproliferative neoplasm with treatment related changes  or  represent a primary myeloproliferative/myelodysplastic neoplasm  including but not limited to myeloid neoplasms with eosinophilia.  Correlation with cytogenetic and FISH studies strongly recommended.      RADIOGRAPHIC STUDIES: I have personally reviewed the radiological images as listed and agreed with the findings in the report. No results found.   ASSESSMENT & PLAN:    #1 JAK2-positive myeloproliferative neoplasm-primarily presenting with thrombocytosis and associated MDS causing refractory anemia -No polycythemia.  -Mild leukocytosis.  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 Has grade 1 out of 3 reticulin fibrosis.  Uncertain if this is primary or secondary. -LDH has remained within normal limits. 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  PLAN: -Labs were reviewed in detail. WBC counts are 18.3 K, Hemoglobin at 6.3 K, and platelet at 622K. CMP stable. -Increase dose of Luspatercept to 1.9m/kg today. -May consider Vidaza if the Luspatercept is not effective at reducing his transfusion dependence. -He will be scheduled for continued monitoring and transfusion support as needed.  -We reviewed that he may be a candidate for port placement but he would like to consider this for a while before proceeding with this option. Discussed the benefits and risks of placing a port. Patient had question about infection rate with port, and I answered that the infection rate is around 5%. -Answered questions regarding any new treatent trials,  which that are none as of right now. -Explained the difference between [place] and [place]. Answered questions regarding Vidaza and Luspatercept.  -Answered all of patient's and his wife's question about his MPN/MDS.   Follow-up Labs and 2 units of PRBC in 10 days Plz schedule next luspatercept inj in 3 weeks per integrated scheduling  The total time spent in the appointment was 30 minutes* .  All of the  patient's questions were answered with apparent satisfaction. The patient knows to call the clinic with any problems, questions or concerns.   Zettie Cooley, am acting as a scribe for Sullivan Lone, MD.  Sullivan Lone MD [place] AAHIVMS [facility] Hematology/Oncology Physician [facility]  .*Total Encounter Time as defined by the Centers for Medicare and Medicaid Services includes, in addition to the face-to-face time of a patient visit (documented in the note above) non-face-to-face time: obtaining and reviewing outside history, ordering and reviewing medications, tests or procedures, care coordination (communications with other health care professionals or caregivers) and documentation in the medical record.

## 2022-08-12 LAB — TYPE AND SCREEN
ABO/RH(D): A POS
Antibody Screen: NEGATIVE
Unit division: 0

## 2022-08-12 LAB — BPAM RBC
Blood Product Expiration Date: 202311032359
ISSUE DATE / TIME: 202310251226
Unit Type and Rh: 6200

## 2022-08-16 DIAGNOSIS — D649 Anemia, unspecified: Secondary | ICD-10-CM | POA: Diagnosis not present

## 2022-08-16 DIAGNOSIS — Z79899 Other long term (current) drug therapy: Secondary | ICD-10-CM | POA: Diagnosis not present

## 2022-08-16 DIAGNOSIS — D469 Myelodysplastic syndrome, unspecified: Secondary | ICD-10-CM | POA: Diagnosis not present

## 2022-08-16 DIAGNOSIS — Z7982 Long term (current) use of aspirin: Secondary | ICD-10-CM | POA: Diagnosis not present

## 2022-08-17 ENCOUNTER — Inpatient Hospital Stay: Payer: Medicare Other

## 2022-08-17 ENCOUNTER — Encounter: Payer: Self-pay | Admitting: Hematology

## 2022-08-20 ENCOUNTER — Other Ambulatory Visit: Payer: Self-pay

## 2022-08-20 DIAGNOSIS — D469 Myelodysplastic syndrome, unspecified: Secondary | ICD-10-CM

## 2022-08-23 ENCOUNTER — Inpatient Hospital Stay: Payer: Medicare Other

## 2022-08-23 ENCOUNTER — Inpatient Hospital Stay: Payer: Medicare Other | Attending: Hematology

## 2022-08-23 ENCOUNTER — Other Ambulatory Visit: Payer: Self-pay

## 2022-08-23 DIAGNOSIS — R5383 Other fatigue: Secondary | ICD-10-CM | POA: Insufficient documentation

## 2022-08-23 DIAGNOSIS — Z79899 Other long term (current) drug therapy: Secondary | ICD-10-CM | POA: Diagnosis not present

## 2022-08-23 DIAGNOSIS — E785 Hyperlipidemia, unspecified: Secondary | ICD-10-CM | POA: Insufficient documentation

## 2022-08-23 DIAGNOSIS — Z87891 Personal history of nicotine dependence: Secondary | ICD-10-CM | POA: Insufficient documentation

## 2022-08-23 DIAGNOSIS — R161 Splenomegaly, not elsewhere classified: Secondary | ICD-10-CM | POA: Diagnosis not present

## 2022-08-23 DIAGNOSIS — Z7982 Long term (current) use of aspirin: Secondary | ICD-10-CM | POA: Diagnosis not present

## 2022-08-23 DIAGNOSIS — D469 Myelodysplastic syndrome, unspecified: Secondary | ICD-10-CM

## 2022-08-23 DIAGNOSIS — Z86010 Personal history of colonic polyps: Secondary | ICD-10-CM | POA: Diagnosis not present

## 2022-08-23 DIAGNOSIS — Z87442 Personal history of urinary calculi: Secondary | ICD-10-CM | POA: Diagnosis not present

## 2022-08-23 DIAGNOSIS — N202 Calculus of kidney with calculus of ureter: Secondary | ICD-10-CM | POA: Insufficient documentation

## 2022-08-23 DIAGNOSIS — Z7952 Long term (current) use of systemic steroids: Secondary | ICD-10-CM | POA: Diagnosis not present

## 2022-08-23 DIAGNOSIS — K449 Diaphragmatic hernia without obstruction or gangrene: Secondary | ICD-10-CM | POA: Insufficient documentation

## 2022-08-23 DIAGNOSIS — K219 Gastro-esophageal reflux disease without esophagitis: Secondary | ICD-10-CM | POA: Insufficient documentation

## 2022-08-23 LAB — CBC WITH DIFFERENTIAL (CANCER CENTER ONLY)
Abs Immature Granulocytes: 2.61 10*3/uL — ABNORMAL HIGH (ref 0.00–0.07)
Basophils Absolute: 0.9 10*3/uL — ABNORMAL HIGH (ref 0.0–0.1)
Basophils Relative: 4 %
Eosinophils Absolute: 1.5 10*3/uL — ABNORMAL HIGH (ref 0.0–0.5)
Eosinophils Relative: 6 %
HCT: 24.1 % — ABNORMAL LOW (ref 39.0–52.0)
Hemoglobin: 7.9 g/dL — ABNORMAL LOW (ref 13.0–17.0)
Immature Granulocytes: 11 %
Lymphocytes Relative: 4 %
Lymphs Abs: 0.9 10*3/uL (ref 0.7–4.0)
MCH: 29.5 pg (ref 26.0–34.0)
MCHC: 32.8 g/dL (ref 30.0–36.0)
MCV: 89.9 fL (ref 80.0–100.0)
Monocytes Absolute: 1.4 10*3/uL — ABNORMAL HIGH (ref 0.1–1.0)
Monocytes Relative: 6 %
Neutro Abs: 16.1 10*3/uL — ABNORMAL HIGH (ref 1.7–7.7)
Neutrophils Relative %: 69 %
Platelet Count: 751 10*3/uL — ABNORMAL HIGH (ref 150–400)
RBC: 2.68 MIL/uL — ABNORMAL LOW (ref 4.22–5.81)
RDW: 15.7 % — ABNORMAL HIGH (ref 11.5–15.5)
WBC Count: 23.3 10*3/uL — ABNORMAL HIGH (ref 4.0–10.5)
nRBC: 0 % (ref 0.0–0.2)

## 2022-08-23 LAB — CMP (CANCER CENTER ONLY)
ALT: 14 U/L (ref 0–44)
AST: 12 U/L — ABNORMAL LOW (ref 15–41)
Albumin: 4.1 g/dL (ref 3.5–5.0)
Alkaline Phosphatase: 84 U/L (ref 38–126)
Anion gap: 8 (ref 5–15)
BUN: 20 mg/dL (ref 8–23)
CO2: 24 mmol/L (ref 22–32)
Calcium: 9 mg/dL (ref 8.9–10.3)
Chloride: 104 mmol/L (ref 98–111)
Creatinine: 1.17 mg/dL (ref 0.61–1.24)
GFR, Estimated: >60 mL/min (ref >60)
Glucose, Bld: 185 mg/dL — ABNORMAL HIGH (ref 70–99)
Potassium: 5.2 mmol/L — ABNORMAL HIGH (ref 3.5–5.1)
Sodium: 136 mmol/L (ref 135–145)
Total Bilirubin: 0.7 mg/dL (ref 0.3–1.2)
Total Protein: 6.5 g/dL (ref 6.5–8.1)

## 2022-08-23 LAB — SAMPLE TO BLOOD BANK

## 2022-08-23 LAB — PREPARE RBC (CROSSMATCH)

## 2022-08-23 MED ORDER — ACETAMINOPHEN 325 MG PO TABS
650.0000 mg | ORAL_TABLET | Freq: Once | ORAL | Status: AC
  Administered 2022-08-23: 650 mg via ORAL
  Filled 2022-08-23: qty 2

## 2022-08-23 MED ORDER — SODIUM CHLORIDE 0.9% IV SOLUTION
250.0000 mL | Freq: Once | INTRAVENOUS | Status: AC
  Administered 2022-08-23: 250 mL via INTRAVENOUS

## 2022-08-23 MED ORDER — METHYLPREDNISOLONE SODIUM SUCC 40 MG IJ SOLR: 40.0000 mg | Freq: Once | INTRAMUSCULAR | Status: DC

## 2022-08-23 NOTE — Progress Notes (Signed)
Patient did not want to take Solumedrol premed prior to blood transfusion. States an oncologist at [facility] said he shouldn't take a steroid. Dr. Irene Limbo aware, okay to hold steroid.

## 2022-08-23 NOTE — Patient Instructions (Signed)

## 2022-08-24 ENCOUNTER — Encounter (INDEPENDENT_AMBULATORY_CARE_PROVIDER_SITE_OTHER): Payer: Medicare Other | Admitting: Ophthalmology

## 2022-08-24 LAB — BPAM RBC
Blood Product Expiration Date: 202311262359
ISSUE DATE / TIME: 202311061042
Unit Type and Rh: 6200

## 2022-08-24 LAB — TYPE AND SCREEN
ABO/RH(D): A POS
Antibody Screen: NEGATIVE
Unit division: 0

## 2022-08-27 DIAGNOSIS — R350 Frequency of micturition: Secondary | ICD-10-CM | POA: Diagnosis not present

## 2022-08-27 DIAGNOSIS — R3912 Poor urinary stream: Secondary | ICD-10-CM | POA: Diagnosis not present

## 2022-08-31 ENCOUNTER — Encounter (HOSPITAL_COMMUNITY): Payer: Self-pay

## 2022-08-31 ENCOUNTER — Inpatient Hospital Stay (HOSPITAL_COMMUNITY)
Admission: EM | Admit: 2022-08-31 | Discharge: 2022-09-01 | DRG: 841 | Disposition: A | Discharge status: Home / Self Care | Payer: Medicare Other | Attending: Internal Medicine | Admitting: Internal Medicine

## 2022-08-31 ENCOUNTER — Emergency Department (HOSPITAL_COMMUNITY): Payer: Medicare Other

## 2022-08-31 DIAGNOSIS — K219 Gastro-esophageal reflux disease without esophagitis: Secondary | ICD-10-CM | POA: Diagnosis not present

## 2022-08-31 DIAGNOSIS — D649 Anemia, unspecified: Secondary | ICD-10-CM | POA: Diagnosis present

## 2022-08-31 DIAGNOSIS — M1711 Unilateral primary osteoarthritis, right knee: Secondary | ICD-10-CM | POA: Diagnosis present

## 2022-08-31 DIAGNOSIS — K449 Diaphragmatic hernia without obstruction or gangrene: Secondary | ICD-10-CM | POA: Diagnosis present

## 2022-08-31 DIAGNOSIS — R1012 Left upper quadrant pain: Secondary | ICD-10-CM | POA: Diagnosis not present

## 2022-08-31 DIAGNOSIS — E782 Mixed hyperlipidemia: Secondary | ICD-10-CM | POA: Diagnosis not present

## 2022-08-31 DIAGNOSIS — D471 Chronic myeloproliferative disease: Secondary | ICD-10-CM | POA: Diagnosis present

## 2022-08-31 DIAGNOSIS — Z6825 Body mass index (BMI) 25.0-25.9, adult: Secondary | ICD-10-CM

## 2022-08-31 DIAGNOSIS — Z7982 Long term (current) use of aspirin: Secondary | ICD-10-CM

## 2022-08-31 DIAGNOSIS — D75839 Thrombocytosis, unspecified: Secondary | ICD-10-CM | POA: Diagnosis present

## 2022-08-31 DIAGNOSIS — D72829 Elevated white blood cell count, unspecified: Secondary | ICD-10-CM | POA: Diagnosis not present

## 2022-08-31 DIAGNOSIS — R161 Splenomegaly, not elsewhere classified: Secondary | ICD-10-CM | POA: Diagnosis not present

## 2022-08-31 DIAGNOSIS — D464 Refractory anemia, unspecified: Secondary | ICD-10-CM | POA: Diagnosis not present

## 2022-08-31 DIAGNOSIS — Z79899 Other long term (current) drug therapy: Secondary | ICD-10-CM

## 2022-08-31 DIAGNOSIS — N200 Calculus of kidney: Secondary | ICD-10-CM | POA: Diagnosis not present

## 2022-08-31 DIAGNOSIS — E669 Obesity, unspecified: Secondary | ICD-10-CM | POA: Diagnosis present

## 2022-08-31 DIAGNOSIS — N201 Calculus of ureter: Secondary | ICD-10-CM | POA: Diagnosis not present

## 2022-08-31 DIAGNOSIS — D469 Myelodysplastic syndrome, unspecified: Secondary | ICD-10-CM | POA: Diagnosis present

## 2022-08-31 DIAGNOSIS — N202 Calculus of kidney with calculus of ureter: Secondary | ICD-10-CM | POA: Diagnosis present

## 2022-08-31 DIAGNOSIS — Z87891 Personal history of nicotine dependence: Secondary | ICD-10-CM

## 2022-08-31 LAB — CBC WITH DIFFERENTIAL/PLATELET
Abs Immature Granulocytes: 1.73 10*3/uL — ABNORMAL HIGH (ref 0.00–0.07)
Basophils Absolute: 0.2 10*3/uL — ABNORMAL HIGH (ref 0.0–0.1)
Basophils Relative: 1 %
Eosinophils Absolute: 0.6 10*3/uL — ABNORMAL HIGH (ref 0.0–0.5)
Eosinophils Relative: 4 %
HCT: 17.8 % — ABNORMAL LOW (ref 39.0–52.0)
Hemoglobin: 5.7 g/dL — CL (ref 13.0–17.0)
Immature Granulocytes: 11 %
Lymphocytes Relative: 4 %
Lymphs Abs: 0.6 10*3/uL — ABNORMAL LOW (ref 0.7–4.0)
MCH: 28.5 pg (ref 26.0–34.0)
MCHC: 32 g/dL (ref 30.0–36.0)
MCV: 89 fL (ref 80.0–100.0)
Monocytes Absolute: 1.1 10*3/uL — ABNORMAL HIGH (ref 0.1–1.0)
Monocytes Relative: 7 %
Neutro Abs: 12.1 10*3/uL — ABNORMAL HIGH (ref 1.7–7.7)
Neutrophils Relative %: 73 %
Platelets: 540 10*3/uL — ABNORMAL HIGH (ref 150–400)
RBC: 2 MIL/uL — ABNORMAL LOW (ref 4.22–5.81)
RDW: 15.6 % — ABNORMAL HIGH (ref 11.5–15.5)
WBC: 16.3 10*3/uL — ABNORMAL HIGH (ref 4.0–10.5)
nRBC: 0 % (ref 0.0–0.2)

## 2022-08-31 LAB — COMPREHENSIVE METABOLIC PANEL
ALT: 13 U/L (ref 0–44)
AST: 24 U/L (ref 15–41)
Albumin: 3.8 g/dL (ref 3.5–5.0)
Alkaline Phosphatase: 79 U/L (ref 38–126)
Anion gap: 6 (ref 5–15)
BUN: 29 mg/dL — ABNORMAL HIGH (ref 8–23)
CO2: 25 mmol/L (ref 22–32)
Calcium: 8.5 mg/dL — ABNORMAL LOW (ref 8.9–10.3)
Chloride: 106 mmol/L (ref 98–111)
Creatinine, Ser: 0.92 mg/dL (ref 0.61–1.24)
GFR, Estimated: >60 mL/min (ref >60)
Glucose, Bld: 130 mg/dL — ABNORMAL HIGH (ref 70–99)
Potassium: 4.4 mmol/L (ref 3.5–5.1)
Sodium: 137 mmol/L (ref 135–145)
Total Bilirubin: 1.8 mg/dL — ABNORMAL HIGH (ref 0.3–1.2)
Total Protein: 6.4 g/dL — ABNORMAL LOW (ref 6.5–8.1)

## 2022-08-31 LAB — LIPASE, BLOOD: Lipase: 30 U/L (ref 11–51)

## 2022-08-31 LAB — PREPARE RBC (CROSSMATCH)

## 2022-08-31 MED ORDER — ONDANSETRON HCL 4 MG/2ML IJ SOLN: 4.0000 mg | Freq: Four times a day (QID) | INTRAMUSCULAR | Status: DC | PRN

## 2022-08-31 MED ORDER — IOHEXOL 350 MG/ML SOLN
100.0000 mL | Freq: Once | INTRAVENOUS | Status: AC | PRN
  Administered 2022-08-31: 100 mL via INTRAVENOUS

## 2022-08-31 MED ORDER — OXYCODONE HCL 5 MG PO TABS
5.0000 mg | ORAL_TABLET | Freq: Once | ORAL | Status: AC
  Administered 2022-08-31: 5 mg via ORAL
  Filled 2022-08-31: qty 1

## 2022-08-31 MED ORDER — ONDANSETRON HCL 4 MG PO TABS: 4.0000 mg | ORAL_TABLET | Freq: Four times a day (QID) | ORAL | Status: DC | PRN

## 2022-08-31 MED ORDER — ACETAMINOPHEN 325 MG PO TABS: 650.0000 mg | ORAL_TABLET | Freq: Four times a day (QID) | ORAL | Status: DC | PRN

## 2022-08-31 MED ORDER — ACETAMINOPHEN 650 MG RE SUPP: 650.0000 mg | Freq: Four times a day (QID) | RECTAL | Status: DC | PRN

## 2022-08-31 MED ORDER — MORPHINE SULFATE (PF) 2 MG/ML IV SOLN
2.0000 mg | INTRAVENOUS | Status: DC | PRN
  Administered 2022-08-31 – 2022-09-01 (×3): 4 mg via INTRAVENOUS
  Filled 2022-08-31 (×3): qty 2

## 2022-08-31 MED ORDER — FENTANYL CITRATE PF 50 MCG/ML IJ SOSY
50.0000 ug | PREFILLED_SYRINGE | Freq: Once | INTRAMUSCULAR | Status: AC
  Administered 2022-08-31: 50 ug via INTRAVENOUS
  Filled 2022-08-31: qty 1

## 2022-08-31 MED ORDER — SODIUM CHLORIDE 0.9 % IV SOLN
10.0000 mL/h | Freq: Once | INTRAVENOUS | Status: AC
  Administered 2022-08-31: 10 mL/h via INTRAVENOUS

## 2022-08-31 NOTE — ED Notes (Signed)
Patient transported to CT 

## 2022-08-31 NOTE — ED Provider Notes (Signed)
Care transferred to me. He has a history of myelodysplastic syndrome. Patient's CTA shows no obvious bleeding. However patient complains of persistent LUQ pain. CT shows massive splenomegaly. This seems to be causing his LUQ pain today.  No signs of rupture.  Discussed with Dr. Lindi Adie, this point radiation might be an option but he will defer to Dr. Irene Limbo, who can see him in the AM. Will admit for transfusion and pain control. Discussed with Dr. Alcario Drought for admission.   Sherwood Gambler, MD 08/31/22 1944

## 2022-08-31 NOTE — ED Notes (Signed)
Pt care taken, resting, blood infusing, no complaints at this time.

## 2022-08-31 NOTE — ED Notes (Signed)
Blood bank has 2 units of blood ready for this patient. Notified Taquita,RN.

## 2022-08-31 NOTE — ED Triage Notes (Signed)
Pt arrived via POV, c/o LLQ abd pain, denies any n/v or diarrhea. Denies any urinary sx, fevers or chills.

## 2022-08-31 NOTE — ED Notes (Signed)
Pt needs 20g or greater IV for CT

## 2022-08-31 NOTE — H&P (Addendum)
History and Physical    Patient: Fernando Lee PRX:[id number] DOB: 1945/11/15 DOA: 08/31/2022 DOS: the patient was seen and examined on 08/31/2022 PCP: Lurline Del, DO  Patient coming from: Home  Chief Complaint:  Chief Complaint  Patient presents with   Abdominal Pain   HPI: Fernando Lee is a 76 y.o. 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  Frequent PRBC transfusions for anemia.  Pt with HGB 7.9 on 11/6, got 1u PRBC transfusion it looks like that day.  Pt in to ED with c/o LUQ abd pain, fatigue, weakness, SOB.  The patient states that he required multiple blood transfusions throughout the month of October.  No fevers, chills, N/V, hematochezia, melena.  Sharp LUQ pain.  Spleen was 14cm in 2022.  20cm on CT today.    Review of Systems: As mentioned in the history of present illness. All other systems reviewed and are negative. Past Medical History:  Diagnosis Date   Arthritis of right knee    Cervicalgia    Chest discomfort    normal stress echo   Chest pain    Colon polyps    Dyslipidemia    Fluttering sensation of heart    GERD without esophagitis    Hematochezia    Hyperglycemia    Hyperlipidemia    Internal hemorrhoids    Kidney stones    Male erectile dysfunction, unspecified    Mixed dyslipidemia    Mixed hyperlipidemia    Ocular migraine    PAC (premature atrial contraction)    Personal history of colonic polyps    Prediabetes    Residual hemorrhoidal skin tags    Unspecified hemorrhoids    Past Surgical History:  Procedure Laterality Date   BONE MARROW BIOPSY     KIDNEY STONE SURGERY     retrieval   Social History:  reports that he has quit smoking. His smoking use included cigarettes. He has never used smokeless tobacco. He reports current alcohol use of about 1.0 standard drink of alcohol per week. No history on file for drug use.  No Known Allergies  Family History  Problem Relation Age of Onset   Stroke  Brother    Congestive Heart Failure Brother     Prior to Admission medications   Medication Sig Start Date End Date Taking? Authorizing Provider  aspirin EC 81 MG tablet Take 81 mg by mouth daily. Swallow whole.    [provider]  B Complex Vitamins (B COMPLEX PO) Take 1 tablet by mouth daily.    [provider]  Cholecalciferol 50 MCG (2000 UT) TABS Take 2,000 Units by mouth daily.    [provider]  docusate sodium (COLACE) 100 MG capsule Take 100 mg by mouth 2 (two) times daily.    [provider]  FIBER PO Take 1 capsule by mouth daily. Unknown strength    [provider]  fish oil-omega-3 fatty acids 1000 MG capsule Take 1 g by mouth 2 (two) times daily. Strength 312m/1500mg    [provider]  Flaxseed, Linseed, (FLAXSEED OIL) 1000 MG CAPS Take by mouth.    [provider]  GLUCOSAMINE CHONDROITIN MSM PO Take 1 tablet by mouth daily. Strength 1500/1500    [provider]  Misc Natural Products (PROSTATE SUPPORT PO) Take 1 capsule by mouth daily. Unknown strength    [provider]  Multiple Vitamin (MULTIVITAMIN) tablet Take 1 tablet by mouth daily. Unknown strength    [provider]  psyllium (METAMUCIL) 58.6 % powder Take 1 packet by mouth daily.    [provider]  Turmeric (QC TUMERIC COMPLEX PO) Take 750 mg by mouth daily. Unknown strenght    [provider]  vitamin C (ASCORBIC ACID) 250 MG tablet Take 500 mg by mouth daily.    [provider]    Physical Exam: Vitals:   08/31/22 1833 08/31/22 1845 08/31/22 1900 08/31/22 1915  BP: 127/65 128/62 133/66 122/63  Pulse: 84 88 87 86  Resp: _0 Temp: 98.6 F (37 C) 98.6 F (37 C)  98.5 F (36.9 C)  TempSrc: Oral Oral    SpO2: 99% 100% 100% 100%  Weight:      Height:       Constitutional: NAD, calm, comfortable Eyes: PERRL, lids and conjunctivae normal ENMT: Mucous membranes are moist. Posterior  pharynx clear of any exudate or lesions.Normal dentition.  Neck: normal, supple, no masses, no thyromegaly Respiratory: clear to auscultation bilaterally, no wheezing, no crackles. Normal respiratory effort. No accessory muscle use.  Cardiovascular: Regular rate and rhythm, no murmurs / rubs / gallops. No extremity edema. 2+ pedal pulses. No carotid bruits.  Abdomen: LUQ TTP with splenomegally Musculoskeletal: no clubbing / cyanosis. No joint deformity upper and lower extremities. Good ROM, no contractures. Normal muscle tone.  Skin: no rashes, lesions, ulcers. No induration Neurologic: CN 2-12 grossly intact. Sensation intact, DTR normal. Strength 5/5 in all 4.  Psychiatric: Normal judgment and insight. Alert and oriented x 3. Normal mood.   Data Reviewed:       Latest Ref Rng & Units 08/31/2022    2:20 PM 08/23/2022    8:26 AM 08/11/2022    9:33 AM  CBC  WBC 4.0 - 10.5 K/uL 16.3  23.3  18.3   Hemoglobin 13.0 - 17.0 g/dL 5.7  7.9  6.3   Hematocrit 39.0 - 52.0 % 17.8  24.1  18.8   Platelets 150 - 400 K/uL 540  751  622       Latest Ref Rng & Units 08/31/2022    2:20 PM 08/23/2022    8:26 AM 08/11/2022    9:33 AM  CMP  Glucose 70 - 99 mg/dL 130  185  144   BUN 8 - 23 mg/dL _1 Creatinine 0.61 - 1.24 mg/dL 0.92  1.17  1.15   Sodium 135 - 145 mmol/L 137  136  139   Potassium 3.5 - 5.1 mmol/L 4.4  5.2  5.3   Chloride 98 - 111 mmol/L 106  104  106   CO2 22 - 32 mmol/L _2 Calcium 8.9 - 10.3 mg/dL 8.5  9.0  8.7   Total Protein 6.5 - 8.1 g/dL 6.4  6.5  6.2   Total Bilirubin 0.3 - 1.2 mg/dL 1.8  0.7  0.6   Alkaline Phos 38 - 126 U/L 79  84  82   AST 15 - 41 U/L _3 ALT 0 - 44 U/L _4 Assessment and Plan: * Symptomatic anemia Slight T. Bili elevation from baseline to 1.8, LUQ pain, HGB drop with no stigmata of GIB, and spleen looking suspiciously enlarged to 20cm up from 14cm last year. Antibody negative on type and screen today. Splenic  RBC hemolysis? 2u PRBC transfusion Repeat CBC in AM LDH, haptoglobin, DAT, and retic count Watch for stigmata  of GIB Will need heme/onc to see him in AM.  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 Lindi Adie  Family Communication: No family in room  Severity of Illness: The appropriate patient status for this patient is OBSERVATION. Observation status is judged to be reasonable and necessary in order to provide the required intensity of service to ensure the patient's safety. The patient's presenting symptoms, physical exam findings, and initial radiographic and laboratory data in the context of their medical condition is felt to place them at decreased risk for further clinical deterioration. Furthermore, it is anticipated that the patient will be medically stable for discharge from the hospital within 2 midnights of admission.   Author: Etta Quill., DO 08/31/2022 8:29 PM  For on call review [email or web address].

## 2022-08-31 NOTE — Assessment & Plan Note (Signed)
Chronic Needs heme/onc consult in AM as above

## 2022-08-31 NOTE — ED Provider Triage Note (Signed)
Emergency Medicine Provider Triage Evaluation Note  Fernando Lee , a 76 y.o. male  was evaluated in triage.  Pt complains of left upper abdominal pain.  Pt has a history of cancer and splenic enlargement.   Review of Systems  Positive: Abdominal pain Negative: fever  Physical Exam  BP (!) 143/68 (BP Location: Left Arm)   Pulse 93   Temp 98.2 F (36.8 C) (Oral)   Resp 18   Ht '5\' 7"'$  (1.702 m)   Wt 74.8 kg   SpO2 98%   BMI 25.84 kg/m  Gen:   Awake, no distress   Resp:  Normal effort  MSK:   Moves extremities without difficulty  Other:  Abdomen  tender left upper abdomen   Medical Decision Making  Medically screening exam initiated at 2:11 PM.  Appropriate orders placed.  JAJA SWITALSKI was informed that the remainder of the evaluation will be completed by another provider, this initial triage assessment does not replace that evaluation, and the importance of remaining in the ED until their evaluation is complete.     Fransico Meadow, [place] 08/31/22 1413

## 2022-08-31 NOTE — Assessment & Plan Note (Addendum)
Slight T. Bili elevation from baseline to 1.8, LUQ pain, HGB drop with no stigmata of GIB, and spleen looking suspiciously enlarged to 20cm up from 14cm last year. Antibody negative on type and screen today. Splenic RBC hemolysis? 2u PRBC transfusion Repeat CBC in AM LDH, haptoglobin, DAT, and retic count Watch for stigmata of GIB Will need heme/onc to see him in AM.

## 2022-08-31 NOTE — ED Provider Notes (Signed)
[facility] DEPT Provider Note   CSN: [id number] Arrival date & time: 08/31/22  1345     History  Chief Complaint  Patient presents with   Abdominal Pain    Fernando Lee is a 76 y.o. male.   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  The patient states that he required multiple blood transfusions throughout the month of October.  His hemoglobin was last noted to be 7.98 days ago.  He denies any hematemesis, hematochezia or melena.  He denies any fevers or chills.  He denies any vomiting.  He endorses sharp left upper quadrant pain.  Home Medications Prior to Admission medications   Medication Sig Start Date End Date Taking? Authorizing Provider  aspirin EC 81 MG tablet Take 81 mg by mouth daily. Swallow whole.    [provider]  B Complex Vitamins (B COMPLEX PO) Take 1 tablet by mouth daily.    [provider]  Cholecalciferol 50 MCG (2000 UT) TABS Take 2,000 Units by mouth daily.    [provider]  docusate sodium (COLACE) 100 MG capsule Take 100 mg by mouth 2 (two) times daily.    [provider]  FIBER PO Take 1 capsule by mouth daily. Unknown strength    [provider]  fish oil-omega-3 fatty acids 1000 MG capsule Take 1 g by mouth 2 (two) times daily. Strength '300mg'$ /'1500mg'$     [provider]  Flaxseed, Linseed, (FLAXSEED OIL) 1000 MG CAPS Take by mouth.    [provider]  GLUCOSAMINE CHONDROITIN MSM PO Take 1 tablet by mouth daily. Strength 1500/1500    [provider]  Misc Natural Products (PROSTATE SUPPORT PO) Take 1 capsule by mouth daily. Unknown strength    [provider]  Multiple Vitamin (MULTIVITAMIN) tablet Take 1 tablet by mouth daily. Unknown strength    [provider]  psyllium (METAMUCIL) 58.6 % powder Take 1 packet by mouth daily.    [provider]  Turmeric (QC TUMERIC COMPLEX PO) Take 750 mg by mouth daily. Unknown strenght    [provider]  vitamin C (ASCORBIC ACID) 250 MG tablet Take 500 mg by mouth daily.    [provider]      Allergies    Patient has no known allergies.    Review of Systems   Review of Systems  Gastrointestinal:  Positive for abdominal pain.  All other systems reviewed and are negative.   Physical Exam Updated Vital Signs BP 121/69   Pulse 84   Temp 98.2 F (36.8 C) (Oral)   Resp 18   Ht '5\' 7"'$  (1.702 m)   Wt 74.8 kg   SpO2 98%   BMI 25.84 kg/m  Physical Exam Vitals and nursing note reviewed.  Constitutional:      General: He is not in acute distress. HENT:     Head: Normocephalic and atraumatic.  Eyes:     Conjunctiva/sclera: Conjunctivae normal.     Pupils: Pupils are equal, round, and reactive to light.  Cardiovascular:     Rate and Rhythm: Normal rate and regular rhythm.  Pulmonary:     Effort: Pulmonary effort is normal. No respiratory distress.  Abdominal:     General: There is no distension.     Tenderness: There is abdominal tenderness in the left upper  quadrant. There is no right CVA tenderness, left CVA tenderness, guarding or rebound.  Musculoskeletal:        General: No deformity or signs of injury.     Cervical back: Neck supple.  Skin:    Findings: No lesion or rash.  Neurological:     General: No focal deficit present.     Mental Status: He is alert. Mental status is at baseline.     ED Results / Procedures / Treatments   Labs (all labs ordered are listed, but only abnormal results are displayed) Labs Reviewed  CBC WITH DIFFERENTIAL/PLATELET - Abnormal; Notable for the following components:      Result Value   WBC 16.3 (*)    RBC 2.00 (*)    Hemoglobin 5.7 (*)    HCT 17.8 (*)    RDW 15.6 (*)    Platelets 540 (*)    Neutro Abs 12.1  (*)    Lymphs Abs 0.6 (*)    Monocytes Absolute 1.1 (*)    Eosinophils Absolute 0.6 (*)    Basophils Absolute 0.2 (*)    Abs Immature Granulocytes 1.73 (*)    All other components within normal limits  COMPREHENSIVE METABOLIC PANEL - Abnormal; Notable for the following components:   Glucose, Bld 130 (*)    BUN 29 (*)    Calcium 8.5 (*)    Total Protein 6.4 (*)    Total Bilirubin 1.8 (*)    All other components within normal limits  LIPASE, BLOOD  PREPARE RBC (CROSSMATCH)  TYPE AND SCREEN    EKG None  Radiology No results found.  Procedures .Critical Care  Performed by: Regan Lemming, MD Authorized by: Regan Lemming, MD   Critical care provider statement:    Critical care time (minutes):  30   Critical care was time spent personally by me on the following activities:  Development of treatment plan with patient or surrogate, discussions with consultants, evaluation of patient's response to treatment, examination of patient, ordering and review of laboratory studies, ordering and review of radiographic studies, ordering and performing treatments and interventions, pulse oximetry, re-evaluation of patient's condition and review of old charts   Care discussed with: admitting provider       Medications Ordered in ED Medications  0.9 %  sodium chloride infusion (has no administration in time range)  iohexol (OMNIPAQUE) 350 MG/ML injection 100 mL (has no administration in time range)  oxyCODONE (Oxy IR/ROXICODONE) immediate release tablet 5 mg (5 mg Oral Given 08/31/22 1430)  fentaNYL (SUBLIMAZE) injection 50 mcg (50 mcg Intravenous Given 08/31/22 1654)    ED Course/ Medical Decision Making/ A&P Clinical Course as of 08/31/22 1745  Tue Aug 31, 2022  1440 Hemoglobin(!!): 5.7 [JL]    Clinical Course User Index [JL] Regan Lemming, MD                           Medical Decision Making Amount and/or Complexity of Data Reviewed Labs: ordered. Decision-making details  documented in ED Course. Radiology: ordered.  Risk Prescription drug management.   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  The patient states that he required multiple blood transfusions throughout the month of October.  His hemoglobin was last noted to be 7.98 days ago.  He denies any hematemesis, hematochezia or melena.  He denies any fevers or  chills.  He denies any vomiting.  He endorses sharp left upper quadrant pain.  On arrival, the patient was vitally stable.  Physical exam significant for left upper quadrant tenderness to palpation.  Initial triage labs concerning for leukocytosis to 16.3, anemia to 5.7, thrombocytosis to 540.  I discussed the patient's case with on-call hematology who did not recommend any immediate intervention regarding the patient beyond his need for blood transfusion.  I discussed plan for blood transfusion with the patient given his symptomatic anemia.  Will evaluate further for any source of intra-abdominal hemorrhage given the patient's rapid decline in his hemoglobin however suspect the primary cause to be related to his MDS.  MP with mildly elevated BUN to 29, normal renal function, normal liver function, mildly elevated T. bili to 1.8.  He was administered fentanyl and oxycodone for pain control.  CT angiogram was ordered and pending of the abdomen pelvis.  2 units PRBCs were ordered after discussion with the patient.  Plan for likely admission following blood product administration for observation, full disposition pending CT imaging given the patient's ongoing abdominal pain.  Signout given to Dr. Regenia Skeeter at 1700.   Final Clinical Impression(s) / ED Diagnoses Final diagnoses:  Left upper quadrant abdominal pain  Symptomatic anemia  MDS (myelodysplastic syndrome) ([place])  Myeloproliferative disorder  [facility])    Rx / DC Orders ED Discharge Orders     None         Regan Lemming, MD 08/31/22 1745

## 2022-09-01 ENCOUNTER — Inpatient Hospital Stay: Payer: Medicare Other

## 2022-09-01 ENCOUNTER — Inpatient Hospital Stay: Payer: Medicare Other | Admitting: Hematology

## 2022-09-01 ENCOUNTER — Other Ambulatory Visit: Payer: Self-pay

## 2022-09-01 ENCOUNTER — Ambulatory Visit: Payer: Medicare Other

## 2022-09-01 DIAGNOSIS — K219 Gastro-esophageal reflux disease without esophagitis: Secondary | ICD-10-CM | POA: Diagnosis present

## 2022-09-01 DIAGNOSIS — D649 Anemia, unspecified: Secondary | ICD-10-CM | POA: Diagnosis present

## 2022-09-01 DIAGNOSIS — R161 Splenomegaly, not elsewhere classified: Secondary | ICD-10-CM

## 2022-09-01 DIAGNOSIS — K449 Diaphragmatic hernia without obstruction or gangrene: Secondary | ICD-10-CM | POA: Diagnosis present

## 2022-09-01 DIAGNOSIS — Z6825 Body mass index (BMI) 25.0-25.9, adult: Secondary | ICD-10-CM | POA: Diagnosis not present

## 2022-09-01 DIAGNOSIS — D464 Refractory anemia, unspecified: Secondary | ICD-10-CM | POA: Diagnosis present

## 2022-09-01 DIAGNOSIS — D469 Myelodysplastic syndrome, unspecified: Secondary | ICD-10-CM | POA: Diagnosis not present

## 2022-09-01 DIAGNOSIS — E669 Obesity, unspecified: Secondary | ICD-10-CM | POA: Diagnosis present

## 2022-09-01 DIAGNOSIS — Z7982 Long term (current) use of aspirin: Secondary | ICD-10-CM | POA: Diagnosis not present

## 2022-09-01 DIAGNOSIS — M1711 Unilateral primary osteoarthritis, right knee: Secondary | ICD-10-CM | POA: Diagnosis present

## 2022-09-01 DIAGNOSIS — E782 Mixed hyperlipidemia: Secondary | ICD-10-CM | POA: Diagnosis present

## 2022-09-01 DIAGNOSIS — Z79899 Other long term (current) drug therapy: Secondary | ICD-10-CM | POA: Diagnosis not present

## 2022-09-01 DIAGNOSIS — N202 Calculus of kidney with calculus of ureter: Secondary | ICD-10-CM | POA: Diagnosis present

## 2022-09-01 DIAGNOSIS — D75839 Thrombocytosis, unspecified: Secondary | ICD-10-CM | POA: Diagnosis present

## 2022-09-01 DIAGNOSIS — D471 Chronic myeloproliferative disease: Secondary | ICD-10-CM | POA: Diagnosis present

## 2022-09-01 DIAGNOSIS — D72829 Elevated white blood cell count, unspecified: Secondary | ICD-10-CM | POA: Diagnosis present

## 2022-09-01 DIAGNOSIS — Z87891 Personal history of nicotine dependence: Secondary | ICD-10-CM | POA: Diagnosis not present

## 2022-09-01 LAB — CBC
HCT: 19.9 % — ABNORMAL LOW (ref 39.0–52.0)
Hemoglobin: 6.7 g/dL — CL (ref 13.0–17.0)
MCH: 29.1 pg (ref 26.0–34.0)
MCHC: 33.7 g/dL (ref 30.0–36.0)
MCV: 86.5 fL (ref 80.0–100.0)
Platelets: 420 10*3/uL — ABNORMAL HIGH (ref 150–400)
RBC: 2.3 MIL/uL — ABNORMAL LOW (ref 4.22–5.81)
RDW: 15 % (ref 11.5–15.5)
WBC: 13.8 10*3/uL — ABNORMAL HIGH (ref 4.0–10.5)
nRBC: 0 % (ref 0.0–0.2)

## 2022-09-01 LAB — DIRECT ANTIGLOBULIN TEST (NOT AT ARMC)
DAT, IgG: NEGATIVE
DAT, complement: NEGATIVE

## 2022-09-01 LAB — BASIC METABOLIC PANEL
Anion gap: 4 — ABNORMAL LOW (ref 5–15)
BUN: 23 mg/dL (ref 8–23)
CO2: 25 mmol/L (ref 22–32)
Calcium: 8.1 mg/dL — ABNORMAL LOW (ref 8.9–10.3)
Chloride: 106 mmol/L (ref 98–111)
Creatinine, Ser: 0.9 mg/dL (ref 0.61–1.24)
GFR, Estimated: >60 mL/min (ref >60)
Glucose, Bld: 136 mg/dL — ABNORMAL HIGH (ref 70–99)
Potassium: 4.4 mmol/L (ref 3.5–5.1)
Sodium: 135 mmol/L (ref 135–145)

## 2022-09-01 LAB — PREPARE RBC (CROSSMATCH)

## 2022-09-01 MED ORDER — TRAMADOL HCL 50 MG PO TABS: 50.0000 mg | ORAL_TABLET | Freq: Four times a day (QID) | ORAL | 0 refills | Status: AC | PRN

## 2022-09-01 MED ORDER — SODIUM CHLORIDE 0.9% IV SOLUTION: Freq: Once | INTRAVENOUS | Status: DC

## 2022-09-01 MED ORDER — PREDNISONE 10 MG PO TABS: ORAL_TABLET | ORAL | 0 refills | Status: DC

## 2022-09-01 MED ORDER — METHYLPREDNISOLONE SODIUM SUCC 125 MG IJ SOLR
125.0000 mg | Freq: Once | INTRAMUSCULAR | Status: AC
  Administered 2022-09-01: 125 mg via INTRAVENOUS
  Filled 2022-09-01: qty 2

## 2022-09-01 NOTE — Progress Notes (Signed)
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 He also is seen by Dr. Joan Mayans at [facility].  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  He was admitted to the ED on 08/31/2022 due to persistent LUQ abdominal pain, shortness of breath and weakness.  He was again noted to have symptomatic anemia.He had an CT angio GI bleed scan on 08/31/2022 which showed significant splenomegaly of 20 x 16 x 11 cm.  He was also noted to have lower pulm right renal calculus and 2 distal left ureteral calculi measuring 4 mm and 5 mm without any hydroureter or hydronephrosis. No overt GI bleeding noted.  Moderate to large hiatal hernia.  His previous ultrasound abdomen on 04/03/2021 showed splenomegaly with a splenic length of 14.6 cm.  He has been given Fentanyl, oxycodone, and Morphine for pain management. Patient reported that Morphine helped relieve his pain.  He notes his pain has been better during today's visit.   He denies of fever, chills, and night sweats during the visit.  He has been to the Urologist, who has prescribed him with Alfuzosin for his urinary symptoms of urinary retention and weak stream.   OBJECTIVE:  PHYSICAL EXAMINATION: . Vitals:   09/01/22 0600 09/01/22 0700 09/01/22 0800 09/01/22 0937  BP: 114/68 123/76 121/70   Pulse: 75 78 78   Resp: 18 19 (!) 21   Temp:    98.9 F (37.2 C)  TempSrc:    Oral  SpO2: 98% 100% 96%   Weight:      Height:       Filed Weights   08/31/22 1353  Weight: 74.8 kg   .Body mass index is 25.84 kg/m.  GENERAL:alert, in no acute distress and comfortable SKIN: skin  color, texture, turgor are normal, no rashes or significant lesions EYES: normal, conjunctiva are pink and non-injected, sclera clear OROPHARYNX:no exudate, no erythema and lips, buccal mucosa, and tongue normal  NECK: supple, no JVD, thyroid normal size, non-tender, without nodularity LYMPH:  no palpable lymphadenopathy in the cervical, axillary or inguinal LUNGS: clear to auscultation with normal respiratory effort HEART: regular rate & rhythm,  no murmurs and no lower extremity edema ABDOMEN: abdomen soft, non-tender, normoactive bowel sounds , enlarged spleen 5 finger breadths below the left costal margin with some mild tenderness to palpation. Musculoskeletal: no cyanosis of digits and no clubbing  PSYCH: alert & oriented x 3 with fluent speech NEURO: no focal motor/sensory deficits  MEDICAL HISTORY:  Past Medical History:  Diagnosis Date   Arthritis of right knee    Cervicalgia    Chest discomfort    normal stress echo   Chest pain    Colon polyps    Dyslipidemia    Fluttering sensation of heart    GERD without esophagitis    Hematochezia    Hyperglycemia    Hyperlipidemia    Internal hemorrhoids    Kidney stones    Male erectile dysfunction, unspecified    Mixed dyslipidemia    Mixed hyperlipidemia    Ocular migraine    PAC (premature atrial contraction)    Personal history of colonic polyps    Prediabetes    Residual  hemorrhoidal skin tags    Unspecified hemorrhoids     SURGICAL HISTORY: Past Surgical History:  Procedure Laterality Date   BONE MARROW BIOPSY     KIDNEY STONE SURGERY     retrieval    SOCIAL HISTORY: Social History   Socioeconomic History   Marital status: Married    Spouse name: Not on file   Number of children: Not on file   Years of education: Not on file   Highest education level: Not on file  Occupational History   Not on file  Tobacco Use   Smoking status: Former    Types: Cigarettes   Smokeless tobacco: Never  Substance and  Sexual Activity   Alcohol use: Yes    Alcohol/week: 1.0 standard drink of alcohol    Types: 1 Cans of beer per week   Drug use: Not on file   Sexual activity: Not on file  Other Topics Concern   Not on file  Social History Narrative   Not on file   Social Determinants of Health   Financial Resource Strain: Not on file  Food Insecurity: Unknown (08/31/2022)   Hunger Vital Sign    Worried About Running Out of Food in the Last Year: Never true    Ran Out of Food in the Last Year: Not on file  Transportation Needs: No Transportation Needs (08/31/2022)   PRAPARE - Hydrologist (Medical): No    Lack of Transportation (Non-Medical): No  Physical Activity: Not on file  Stress: Not on file  Social Connections: Not on file  Intimate Partner Violence: Not on file    FAMILY HISTORY: Family History  Problem Relation Age of Onset   Stroke Brother    Congestive Heart Failure Brother     ALLERGIES:  has No Known Allergies.  MEDICATIONS:  Scheduled Meds:  sodium chloride   Intravenous Once   Continuous Infusions: PRN Meds:.acetaminophen **OR** acetaminophen, morphine injection, ondansetron **OR** ondansetron (ZOFRAN) IV  REVIEW OF SYSTEMS:    10 Point review of Systems was done is negative except as noted above.   LABORATORY DATA:  I have reviewed the data as listed  .    Latest Ref Rng & Units 09/01/2022    3:30 AM 08/31/2022    2:20 PM 08/23/2022    8:26 AM  CBC  WBC 4.0 - 10.5 K/uL 13.8  16.3  23.3   Hemoglobin 13.0 - 17.0 g/dL 6.7  5.7  7.9   Hematocrit 39.0 - 52.0 % 19.9  17.8  24.1   Platelets 150 - 400 K/uL 420  540  751     .    Latest Ref Rng & Units 09/01/2022    3:30 AM 08/31/2022    2:20 PM 08/23/2022    8:26 AM  CMP  Glucose 70 - 99 mg/dL 136  130  185   BUN 8 - 23 mg/dL _0 Creatinine 0.61 - 1.24 mg/dL 0.90  0.92  1.17   Sodium 135 - 145 mmol/L 135  137  136   Potassium 3.5 - 5.1 mmol/L 4.4  4.4  5.2    Chloride 98 - 111 mmol/L 106  106  104   CO2 22 - 32 mmol/L _1 Calcium 8.9 - 10.3 mg/dL 8.1  8.5  9.0   Total Protein 6.5 - 8.1 g/dL  6.4  6.5   Total Bilirubin 0.3 - 1.2 mg/dL  1.8  0.7   Alkaline Phos 38 - 126 U/L  79  84   AST 15 - 41 U/L  24  12   ALT 0 - 44 U/L  13  14      RADIOGRAPHIC STUDIES: I have personally reviewed the radiological images as listed and agreed with the findings in the report. CT ANGIO GI BLEED  Result Date: 08/31/2022 CLINICAL DATA:  Acute mesenteric ischemia. Abdominal pain. History of myelodysplastic syndrome. EXAM: CTA ABDOMEN AND PELVIS WITHOUT AND WITH CONTRAST TECHNIQUE: Multidetector CT imaging of the abdomen and pelvis was performed using the standard protocol during bolus administration of intravenous contrast. Multiplanar reconstructed images and MIPs were obtained and reviewed to evaluate the vascular anatomy. RADIATION DOSE REDUCTION: This exam was performed according to the departmental dose-optimization program which includes automated exposure control, adjustment of the mA and/or kV according to patient size and/or use of iterative reconstruction technique. CONTRAST:  132m OMNIPAQUE IOHEXOL 350 MG/ML SOLN COMPARISON:  Abdominal ultrasound examination 04/03/2021 FINDINGS: VASCULAR Aorta: Normal caliber abdominal aorta. Scattered atherosclerotic calcifications. No dissection. Celiac: Normal SMA: Normal Renals: Normal IMA: Patent Inflow: Normal Proximal Outflow: Normal Veins: Normal Review of the MIP images confirms the above findings. NON-VASCULAR Lower chest: Small left pleural effusion and bibasilar atelectasis. The heart is normal in size. No pericardial effusion. There is a moderate to large hiatal hernia. Hepatobiliary: No hepatic lesions or intrahepatic biliary dilatation. The gallbladder is unremarkable. No common bile duct dilatation. Pancreas: No mass, inflammation or ductal dilatation. Spleen: Massive splenomegaly. The spleen measures 20  x 16 x 11 cm. No splenic lesions or splenic infarct. Adrenals/Urinary Tract: The left kidney is displaced medially and inferiorly by the enlarged spleen. No worrisome renal lesions,, hydronephrosis or pyelonephritis. There is a lower pole right renal calculus and there are 2 distal left ureteral calculi more proximal calculus measures 4 mm and the more distal calculus measures 5 mm. I do not see any hydroureter or hydronephrosis. Stomach/Bowel: The stomach, duodenum, small bowel and colon are grossly normal. No inflammatory changes, mass lesions or obstructive findings. Lymphatic: No abdominal or pelvic lymphadenopathy. Reproductive: The prostate gland is enlarged. The seminal vesicles are unremarkable. Other: No pelvic mass or adenopathy. No free pelvic fluid collections. No inguinal mass or adenopathy. No abdominal wall hernia or subcutaneous lesions. Musculoskeletal: No significant bony findings. IMPRESSION: 1. Normal caliber abdominal aorta and no dissection. The branch vessels are normal. 2. Splenomegaly. 3. Two distal left ureteral calculi but no hydroureter or hydronephrosis. 4. Lower pole right renal calculus. 5. Moderate to large hiatal hernia. 6. Small left pleural effusion and bibasilar atelectasis. Electronically Signed   By: PMarijo SanesM.D.   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  -Mild leukocytosis.  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 Has grade 1 out of 3 reticulin fibrosis.  Uncertain if this is primary or secondary. -LDH has remained within normal limits. 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  #2 Progression to massive splenomegaly with left upper quadrant abdominal pain. Still has myeloid left shift but no significant increase in blasts to suggest transformation to AML.  Still with  similar pattern of thrombocytosis leukocytosis with myeloid left shift and refractory anemia.  #3 symptomatic anemia -from his MDS.  No overt evidence of GI bleeding  #4 left upper quadrant abdominal pain likely from his significant splenomegaly but rapid improvement in pain  after pain meds also suggest this could be from his ureterolithiasis on the left side.  PLAN: -Transfuse as needed to maintain hemoglobin of at least 7. -Pain medications as per hospital medicine team with plan to discharge on pain meds as needed. -Drink adequate amounts of water to try to flush out the small urinary stones.  He has been started on alfuzosin by his urologist and should continue following up with urology regarding his ureterolithiasis and other urinary symptoms. -We given him a dose of Solu-Medrol and was recommended to discharge on oral prednisone taper sent over 2 weeks to try to reduce any inflammation in the spleen. -We will set him up for weekly labs and transfusion support as needed as outpatient. -We discussed that given his significant splenomegaly we would recommend transitioning from [place] to [place]. -He is now agreeable with Port-A-Cath placement as outpatient to help facilitate more frequent blood monitoring transfusion support and IV Vidaza. -Answered all of patient's and his wife's question about his MPN/MDS.  -Plan communicated with hospitalist Dr. Sloan Leiter. -Outpatient orders for [place] placed, Port-A-Cath ordered and scheduling message sent.  The total time spent in the appointment was 60 minutes*.  All of the patient's questions were answered with apparent satisfaction. The patient knows to call the clinic with any problems, questions or concerns.   Sullivan Lone MD MS AAHIVMS [facility] Hematology/Oncology Physician [facility]  .*Total Encounter Time as defined by the Centers for Medicare and Medicaid Services includes, in addition to the face-to-face time of a patient  visit (documented in the note above) non-face-to-face time: obtaining and reviewing outside history, ordering and reviewing medications, tests or procedures, care coordination (communications with other health care professionals or caregivers) and documentation in the medical record.  09/01/2022 9:42 AM

## 2022-09-01 NOTE — Discharge Summary (Signed)
Physician Discharge Summary  Fernando Lee:[id number] DOB: December 24, 1945 DOA: 08/31/2022  PCP: Lurline Del, DO  Admit date: 08/31/2022 Discharge date: 09/01/2022  Admitted From: Home Disposition: Home  Recommendations for Outpatient Follow-up:  Follow up with oncology office scheduled.   Home Health: N/A Equipment/Devices: N/A  Discharge Condition: Stable CODE STATUS: Full code Diet recommendation: Regular diet  Discharge summary: 76 year old gentleman with history of JAK2 positive MDS on Luspatercept, transfusion dependent recurrent anemia presenting with left upper quadrant abdominal pain, fatigue and weakness.  Was  scheduled for dose of chemotherapy and blood transfusion today as outpatient.  He was found to have a hemoglobin of 5.7, CT scan with splenomegaly of 20 cm.  Admitted with oncology consultation.   # Symptomatic anemia due to severe bone marrow suppression from myelodysplastic syndrome. 5.7-2 units of PRBC transfusion-6.7.  Received third unit of transfusion.  Clinically improved.  Seen by oncology.  Decision was made to discharge him with prednisone taper, also prescribed him Short course of tramadol to take for moderate pain.  Patient is medically stabilized.  He will be followed very closely at oncology clinic.  Platelets and WBC counts are stable. Significant splenomegaly and discomfort.  No evidence of acute complications.   Patient was initially admitted to inpatient as there was recommendations for inpatient procedures and patient needed third unit of blood transfusion, however patient decided to go home and do procedure as outpatient and was discharged with outpatient follow-up on his request.  Discharge Diagnoses:  Principal Problem:   Symptomatic anemia Active Problems:   MDS (myelodysplastic syndrome) ([place])   Anemia    Discharge Instructions  Discharge Instructions     Diet general   Complete by: As directed    Increase activity slowly    Complete by: As directed       Allergies as of 09/01/2022   No Known Allergies      Medication List     STOP taking these medications    LUSPATERCEPT-AAMT [place]       TAKE these medications    aspirin EC 81 MG tablet Take 81 mg by mouth daily. Swallow whole.   B COMPLEX PO Take 1 tablet by mouth daily.   docusate sodium 100 MG capsule Commonly known as: COLACE Take 100 mg by mouth 2 (two) times daily.   FIBER PO Take 1 capsule by mouth daily. Unknown strength   fish oil-omega-3 fatty acids 1000 MG capsule Take 1 g by mouth 2 (two) times daily. Strength '300mg'$ /'1500mg'$    Flaxseed Oil 1000 MG Caps Take 1,000 mg by mouth daily.   GLUCOSAMINE CHONDROITIN MSM PO Take 1 tablet by mouth daily. Strength 1500/1500   multivitamin tablet Take 1 tablet by mouth daily. Unknown strength   predniSONE 10 MG tablet Commonly known as: DELTASONE 4 tabs daily for 4 days 3 tabs daily for 4 days 2 tabs daily for 4 days 1 tab daily for 4 days   PROSTATE SUPPORT PO Take 1 capsule by mouth daily. Unknown strength   psyllium 58.6 % powder Commonly known as: METAMUCIL Take 1 packet by mouth daily.   QC TUMERIC COMPLEX PO Take 750 mg by mouth daily.   traMADol 50 MG tablet Commonly known as: Ultram Take 1 tablet (50 mg total) by mouth every 6 (six) hours as needed for up to 5 days.   vitamin C 250 MG tablet Commonly known as: ASCORBIC ACID Take 500 mg by mouth daily.   Vitamin D3 50 MCG (2000 UT) Tabs  Take 2,000 Units by mouth daily.        No Known Allergies  Consultations: Hematology oncology   Procedures/Studies: CT ANGIO GI BLEED  Result Date: 08/31/2022 CLINICAL DATA:  Acute mesenteric ischemia. Abdominal pain. History of myelodysplastic syndrome. EXAM: CTA ABDOMEN AND PELVIS WITHOUT AND WITH CONTRAST TECHNIQUE: Multidetector CT imaging of the abdomen and pelvis was performed using the standard protocol during bolus administration of intravenous contrast.  Multiplanar reconstructed images and MIPs were obtained and reviewed to evaluate the vascular anatomy. RADIATION DOSE REDUCTION: This exam was performed according to the departmental dose-optimization program which includes automated exposure control, adjustment of the mA and/or kV according to patient size and/or use of iterative reconstruction technique. CONTRAST:  157m OMNIPAQUE IOHEXOL 350 MG/ML SOLN COMPARISON:  Abdominal ultrasound examination 04/03/2021 FINDINGS: VASCULAR Aorta: Normal caliber abdominal aorta. Scattered atherosclerotic calcifications. No dissection. Celiac: Normal SMA: Normal Renals: Normal IMA: Patent Inflow: Normal Proximal Outflow: Normal Veins: Normal Review of the MIP images confirms the above findings. NON-VASCULAR Lower chest: Small left pleural effusion and bibasilar atelectasis. The heart is normal in size. No pericardial effusion. There is a moderate to large hiatal hernia. Hepatobiliary: No hepatic lesions or intrahepatic biliary dilatation. The gallbladder is unremarkable. No common bile duct dilatation. Pancreas: No mass, inflammation or ductal dilatation. Spleen: Massive splenomegaly. The spleen measures 20 x 16 x 11 cm. No splenic lesions or splenic infarct. Adrenals/Urinary Tract: The left kidney is displaced medially and inferiorly by the enlarged spleen. No worrisome renal lesions,, hydronephrosis or pyelonephritis. There is a lower pole right renal calculus and there are 2 distal left ureteral calculi more proximal calculus measures 4 mm and the more distal calculus measures 5 mm. I do not see any hydroureter or hydronephrosis. Stomach/Bowel: The stomach, duodenum, small bowel and colon are grossly normal. No inflammatory changes, mass lesions or obstructive findings. Lymphatic: No abdominal or pelvic lymphadenopathy. Reproductive: The prostate gland is enlarged. The seminal vesicles are unremarkable. Other: No pelvic mass or adenopathy. No free pelvic fluid collections.  No inguinal mass or adenopathy. No abdominal wall hernia or subcutaneous lesions. Musculoskeletal: No significant bony findings. IMPRESSION: 1. Normal caliber abdominal aorta and no dissection. The branch vessels are normal. 2. Splenomegaly. 3. Two distal left ureteral calculi but no hydroureter or hydronephrosis. 4. Lower pole right renal calculus. 5. Moderate to large hiatal hernia. 6. Small left pleural effusion and bibasilar atelectasis. Electronically Signed   By: PMarijo SanesM.D.   On: 08/31/2022 18:13   (Echo, Carotid, EGD, Colonoscopy, ERCP)    Subjective: Patient seen and examined in the morning rounds.  Detailed discussion with oncology.  He was again seen in the evening for discharge readiness.  Wife at the bedside.  Eagerly wanting to go home.  Up about and walking around.   Discharge Exam: Vitals:   09/01/22 1500 09/01/22 1533  BP: 123/67 120/75  Pulse: 78 79  Resp: (!) 21 (!) 23  Temp:  98.9 F (37.2 C)  SpO2: 97% 98%   Vitals:   09/01/22 1343 09/01/22 1400 09/01/22 1500 09/01/22 1533  BP: 118/66 119/67 123/67 120/75  Pulse: 78 79 78 79  Resp: (!) 24 (!) 25 (!) 21 (!) 23  Temp: 99.5 F (37.5 C)   98.9 F (37.2 C)  TempSrc: Oral   Oral  SpO2: 97% 97% 97% 98%  Weight:      Height:        General: Pt is alert, awake, not in acute distress Cardiovascular: RRR, S1/S2 +,  no rubs, no gallops Respiratory: CTA bilaterally, no wheezing, no rhonchi Abdominal: Soft, NT, ND, bowel sounds +, culpable form for splenomegaly left upper quadrant. Extremities: no edema, no cyanosis    The results of significant diagnostics from this hospitalization (including imaging, microbiology, ancillary and laboratory) are listed below for reference.     Microbiology: No results found for this or any previous visit (from the past 240 hour(s)).   Labs: BNP (last 3 results) No results for input(s): "BNP" in the last 8760 hours. Basic Metabolic Panel: Recent Labs  Lab 08/31/22 1420  09/01/22 0330  NA 137 135  K 4.4 4.4  CL 106 106  CO2 25 25  GLUCOSE 130* 136*  BUN 29* 23  CREATININE 0.92 0.90  CALCIUM 8.5* 8.1*   Liver Function Tests: Recent Labs  Lab 08/31/22 1420  AST 24  ALT 13  ALKPHOS 79  BILITOT 1.8*  PROT 6.4*  ALBUMIN 3.8   Recent Labs  Lab 08/31/22 1420  LIPASE 30   No results for input(s): "AMMONIA" in the last 168 hours. CBC: Recent Labs  Lab 08/31/22 1420 09/01/22 0330  WBC 16.3* 13.8*  NEUTROABS 12.1*  --   HGB 5.7* 6.7*  HCT 17.8* 19.9*  MCV 89.0 86.5  PLT 540* 420*   Cardiac Enzymes: No results for input(s): "CKTOTAL", "CKMB", "CKMBINDEX", "TROPONINI" in the last 168 hours. BNP: Invalid input(s): "POCBNP" CBG: No results for input(s): "GLUCAP" in the last 168 hours. D-Dimer No results for input(s): "DDIMER" in the last 72 hours. Hgb A1c No results for input(s): "HGBA1C" in the last 72 hours. Lipid Profile No results for input(s): "CHOL", "HDL", "LDLCALC", "TRIG", "CHOLHDL", "LDLDIRECT" in the last 72 hours. Thyroid function studies No results for input(s): "TSH", "T4TOTAL", "T3FREE", "THYROIDAB" in the last 72 hours.  Invalid input(s): "FREET3" Anemia work up No results for input(s): "VITAMINB12", "FOLATE", "FERRITIN", "TIBC", "IRON", "RETICCTPCT" in the last 72 hours. Urinalysis No results found for: "COLORURINE", "APPEARANCEUR", "LABSPEC", "PHURINE", "GLUCOSEU", "HGBUR", "BILIRUBINUR", "KETONESUR", "PROTEINUR", "UROBILINOGEN", "NITRITE", "LEUKOCYTESUR" Sepsis Labs Recent Labs  Lab 08/31/22 1420 09/01/22 0330  WBC 16.3* 13.8*   Microbiology No results found for this or any previous visit (from the past 240 hour(s)).   Time coordinating discharge: 35 minutes  SIGNED:   Barb Merino, MD  [facility] 09/01/2022, 4:12 PM

## 2022-09-01 NOTE — Progress Notes (Signed)
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  Was a scheduled for dose of chemotherapy and blood transfusion today as outpatient.  He was found to have a hemoglobin of 5.7, CT scan with splenomegaly of 20 cm.  Admitted with oncology consultation.   Assessment & Plan:   Symptomatic anemia due to severe bone marrow suppression from myelodysplastic syndrome. 5.7-2 units of PRBC transfusion-6.7.  We will transfuse 1 more unit of blood today.  Patient consented. Platelets and WBC counts are stable. Significant splenomegaly and discomfort.  No evidence of acute complications.  Called and discussed case with oncology.  Further management plan will be deferred to oncology. Discussed whether splenomegaly is causing sequestration of her obesity and may benefit with intervention, discussion deferred to oncology.   DVT prophylaxis: SCDs Start: 08/31/22 2012   Code Status: Full code Family Communication: None at the bedside Disposition Plan: Status is: Observation Should be changed to inpatient . Anticipate need for more transfusions, trial of therapies and procedures      Consultants:  Hematology oncology  Procedures:  None  Antimicrobials:  None   Subjective: Patient seen in the morning rounds.  Denied any overnight events.  He just had a busy night in the ER.  Completed 2 units of PRBC overnight but hemoglobin is still 6.7.  Consented for on the unit PRBC. Does have abdominal mild to moderate discomfort in the right upper quadrant and palpable spleen that he can feel.   Objective: Vitals:   09/01/22 0800 09/01/22 0937 09/01/22 1000 09/01/22 1047  BP: 121/70  114/67 (!) 141/73  Pulse: 78  86 87  Resp: (!) 21  (!) 22  (!) 24  Temp:  98.9 F (37.2 C)  99 F (37.2 C)  TempSrc:  Oral  Oral  SpO2: 96%  96% 98%  Weight:      Height:        Intake/Output Summary (Last 24 hours) at 09/01/2022 1101 Last data filed at 08/31/2022 2123 Gross per 24 hour  Intake 328 ml  Output --  Net 328 ml   Filed Weights   08/31/22 1353  Weight: 74.8 kg    Examination:  General exam: Appears calm and comfortable, on room air. Respiratory system: Clear to auscultation. Respiratory effort normal. Cardiovascular system: S1 & S2 heard, RRR. No JVD, murmurs, rubs, gallops or clicks. No pedal edema. Gastrointestinal system: Soft.  Nontender.  Palpable splenomegaly left upper quadrant.  Nontender. Central nervous system: Alert and oriented. No focal neurological deficits. Extremities: Symmetric 5 x 5 power. Skin: No rashes, lesions or ulcers Psychiatry: Judgement and insight appear normal. Mood & affect appropriate anxious.    Data Reviewed: I have personally reviewed following labs and imaging studies  CBC: Recent Labs  Lab 08/31/22 1420 09/01/22 0330  WBC 16.3* 13.8*  NEUTROABS 12.1*  --   HGB 5.7* 6.7*  HCT 17.8* 19.9*  MCV 89.0 86.5  PLT 540* 092*   Basic Metabolic Panel: Recent Labs  Lab 08/31/22 1420 09/01/22 0330  NA 137 135  K 4.4 4.4  CL 106 106  CO2 25 25  GLUCOSE 130* 136*  BUN 29* 23  CREATININE 0.92 0.90  CALCIUM 8.5* 8.1*   GFR: Estimated Creatinine Clearance: 63.2 mL/min (by C-G formula based on  SCr of 0.9 mg/dL). Liver Function Tests: Recent Labs  Lab 08/31/22 1420  AST 24  ALT 13  ALKPHOS 79  BILITOT 1.8*  PROT 6.4*  ALBUMIN 3.8   Recent Labs  Lab 08/31/22 1420  LIPASE 30   No results for input(s): "AMMONIA" in the last 168 hours. Coagulation Profile: No results for input(s): "INR", "PROTIME" in the last 168 hours. Cardiac Enzymes: No results for input(s): "CKTOTAL", "CKMB", "CKMBINDEX", "TROPONINI" in the last 168 hours. BNP (last 3 results) No results for  input(s): "PROBNP" in the last 8760 hours. HbA1C: No results for input(s): "HGBA1C" in the last 72 hours. CBG: No results for input(s): "GLUCAP" in the last 168 hours. Lipid Profile: No results for input(s): "CHOL", "HDL", "LDLCALC", "TRIG", "CHOLHDL", "LDLDIRECT" in the last 72 hours. Thyroid Function Tests: No results for input(s): "TSH", "T4TOTAL", "FREET4", "T3FREE", "THYROIDAB" in the last 72 hours. Anemia Panel: No results for input(s): "VITAMINB12", "FOLATE", "FERRITIN", "TIBC", "IRON", "RETICCTPCT" in the last 72 hours. Sepsis Labs: No results for input(s): "PROCALCITON", "LATICACIDVEN" in the last 168 hours.  No results found for this or any previous visit (from the past 240 hour(s)).       Radiology Studies: CT ANGIO GI BLEED  Result Date: 08/31/2022 CLINICAL DATA:  Acute mesenteric ischemia. Abdominal pain. History of myelodysplastic syndrome. EXAM: CTA ABDOMEN AND PELVIS WITHOUT AND WITH CONTRAST TECHNIQUE: Multidetector CT imaging of the abdomen and pelvis was performed using the standard protocol during bolus administration of intravenous contrast. Multiplanar reconstructed images and MIPs were obtained and reviewed to evaluate the vascular anatomy. RADIATION DOSE REDUCTION: This exam was performed according to the departmental dose-optimization program which includes automated exposure control, adjustment of the mA and/or kV according to patient size and/or use of iterative reconstruction technique. CONTRAST:  137m OMNIPAQUE IOHEXOL 350 MG/ML SOLN COMPARISON:  Abdominal ultrasound examination 04/03/2021 FINDINGS: VASCULAR Aorta: Normal caliber abdominal aorta. Scattered atherosclerotic calcifications. No dissection. Celiac: Normal SMA: Normal Renals: Normal IMA: Patent Inflow: Normal Proximal Outflow: Normal Veins: Normal Review of the MIP images confirms the above findings. NON-VASCULAR Lower chest: Small left pleural effusion and bibasilar atelectasis. The heart is normal in  size. No pericardial effusion. There is a moderate to large hiatal hernia. Hepatobiliary: No hepatic lesions or intrahepatic biliary dilatation. The gallbladder is unremarkable. No common bile duct dilatation. Pancreas: No mass, inflammation or ductal dilatation. Spleen: Massive splenomegaly. The spleen measures 20 x 16 x 11 cm. No splenic lesions or splenic infarct. Adrenals/Urinary Tract: The left kidney is displaced medially and inferiorly by the enlarged spleen. No worrisome renal lesions,, hydronephrosis or pyelonephritis. There is a lower pole right renal calculus and there are 2 distal left ureteral calculi more proximal calculus measures 4 mm and the more distal calculus measures 5 mm. I do not see any hydroureter or hydronephrosis. Stomach/Bowel: The stomach, duodenum, small bowel and colon are grossly normal. No inflammatory changes, mass lesions or obstructive findings. Lymphatic: No abdominal or pelvic lymphadenopathy. Reproductive: The prostate gland is enlarged. The seminal vesicles are unremarkable. Other: No pelvic mass or adenopathy. No free pelvic fluid collections. No inguinal mass or adenopathy. No abdominal wall hernia or subcutaneous lesions. Musculoskeletal: No significant bony findings. IMPRESSION: 1. Normal caliber abdominal aorta and no dissection. The branch vessels are normal. 2. Splenomegaly. 3. Two distal left ureteral calculi but no hydroureter or hydronephrosis. 4. Lower pole right renal calculus. 5. Moderate to large hiatal hernia. 6. Small left pleural effusion and bibasilar atelectasis. Electronically Signed   By: PMamie Nick  Gallerani M.D.   On: 08/31/2022 18:13        Scheduled Meds:  sodium chloride   Intravenous Once   Continuous Infusions:   LOS: 0 days    Time spent: 35 minutes    Barb Merino, MD [facility] Pager [phone]

## 2022-09-01 NOTE — ED Notes (Signed)
Pt in bed, no hives or redness noted, pt denies itchiness.  Pt continues blood transfusion, pt states that he doesn't need anything for his pain at this time .

## 2022-09-01 NOTE — ED Notes (Signed)
Pt in bed, sig other at bedside, pt states that he is ready to go home, pt reports some slight swelling in his R arm, pt states that he doesn't need or want anything for it, pt states that it is already getting better. Pt d/c pt iv times two, dressing placed, pt verbalized understanding d/c instructions and follow up. Pt from dpt with sig other.

## 2022-09-02 ENCOUNTER — Other Ambulatory Visit: Payer: Self-pay | Admitting: Hematology

## 2022-09-02 DIAGNOSIS — R161 Splenomegaly, not elsewhere classified: Secondary | ICD-10-CM

## 2022-09-02 DIAGNOSIS — D469 Myelodysplastic syndrome, unspecified: Secondary | ICD-10-CM

## 2022-09-02 DIAGNOSIS — D649 Anemia, unspecified: Secondary | ICD-10-CM

## 2022-09-02 MED ORDER — LIDOCAINE-PRILOCAINE 2.5-2.5 % EX CREA: TOPICAL_CREAM | CUTANEOUS | 3 refills | Status: DC

## 2022-09-02 MED ORDER — PROCHLORPERAZINE MALEATE 10 MG PO TABS: 10.0000 mg | ORAL_TABLET | Freq: Four times a day (QID) | ORAL | 1 refills | Status: DC | PRN

## 2022-09-02 MED ORDER — ONDANSETRON HCL 8 MG PO TABS: 8.0000 mg | ORAL_TABLET | Freq: Three times a day (TID) | ORAL | 1 refills | Status: DC | PRN

## 2022-09-02 NOTE — Progress Notes (Signed)
IV Vidaza, Port-A-Cath orders placed. Scheduling message sent. Weekly labs and 1 unit of PRBC transfusion support as needed Hoping to start [place] 09/06/2022. Patient has been recommended to avoid all contact activities due to massive splenomegaly with increased risk of splenic injury.

## 2022-09-03 ENCOUNTER — Other Ambulatory Visit: Payer: Self-pay

## 2022-09-03 DIAGNOSIS — D469 Myelodysplastic syndrome, unspecified: Secondary | ICD-10-CM

## 2022-09-04 LAB — BPAM RBC
Blood Product Expiration Date: 202312022359
Blood Product Expiration Date: 202312062359
Blood Product Expiration Date: 202312122359
Blood Product Expiration Date: 202312132359
ISSUE DATE / TIME: 202311141821
ISSUE DATE / TIME: 202311142128
ISSUE DATE / TIME: 202311151032
Unit Type and Rh: 6200
Unit Type and Rh: 6200
Unit Type and Rh: 6200
Unit Type and Rh: 6200

## 2022-09-04 LAB — TYPE AND SCREEN
ABO/RH(D): A POS
Antibody Screen: NEGATIVE
Unit division: 0
Unit division: 0
Unit division: 0
Unit division: 0

## 2022-09-06 ENCOUNTER — Inpatient Hospital Stay: Payer: Medicare Other

## 2022-09-06 ENCOUNTER — Other Ambulatory Visit: Payer: Self-pay

## 2022-09-06 ENCOUNTER — Inpatient Hospital Stay (HOSPITAL_BASED_OUTPATIENT_CLINIC_OR_DEPARTMENT_OTHER): Payer: Medicare Other | Admitting: Hematology

## 2022-09-06 VITALS — BP 125/64 | HR 90 | Temp 98.4°F | Resp 20 | Wt 163.1 lb

## 2022-09-06 VITALS — BP 106/64 | HR 73 | Temp 98.2°F | Resp 16

## 2022-09-06 DIAGNOSIS — K449 Diaphragmatic hernia without obstruction or gangrene: Secondary | ICD-10-CM | POA: Diagnosis not present

## 2022-09-06 DIAGNOSIS — D469 Myelodysplastic syndrome, unspecified: Secondary | ICD-10-CM

## 2022-09-06 DIAGNOSIS — K219 Gastro-esophageal reflux disease without esophagitis: Secondary | ICD-10-CM | POA: Diagnosis not present

## 2022-09-06 DIAGNOSIS — Z7982 Long term (current) use of aspirin: Secondary | ICD-10-CM | POA: Diagnosis not present

## 2022-09-06 DIAGNOSIS — Z87891 Personal history of nicotine dependence: Secondary | ICD-10-CM | POA: Diagnosis not present

## 2022-09-06 DIAGNOSIS — Z87442 Personal history of urinary calculi: Secondary | ICD-10-CM | POA: Diagnosis not present

## 2022-09-06 DIAGNOSIS — D649 Anemia, unspecified: Secondary | ICD-10-CM

## 2022-09-06 DIAGNOSIS — R5383 Other fatigue: Secondary | ICD-10-CM | POA: Diagnosis not present

## 2022-09-06 DIAGNOSIS — N202 Calculus of kidney with calculus of ureter: Secondary | ICD-10-CM | POA: Diagnosis not present

## 2022-09-06 DIAGNOSIS — Z86010 Personal history of colonic polyps: Secondary | ICD-10-CM | POA: Diagnosis not present

## 2022-09-06 DIAGNOSIS — R161 Splenomegaly, not elsewhere classified: Secondary | ICD-10-CM | POA: Diagnosis not present

## 2022-09-06 DIAGNOSIS — Z7952 Long term (current) use of systemic steroids: Secondary | ICD-10-CM | POA: Diagnosis not present

## 2022-09-06 DIAGNOSIS — Z79899 Other long term (current) drug therapy: Secondary | ICD-10-CM | POA: Diagnosis not present

## 2022-09-06 DIAGNOSIS — E785 Hyperlipidemia, unspecified: Secondary | ICD-10-CM | POA: Diagnosis not present

## 2022-09-06 LAB — COMPREHENSIVE METABOLIC PANEL
ALT: 19 U/L (ref 0–44)
AST: 14 U/L — ABNORMAL LOW (ref 15–41)
Albumin: 4 g/dL (ref 3.5–5.0)
Alkaline Phosphatase: 77 U/L (ref 38–126)
Anion gap: 8 (ref 5–15)
BUN: 27 mg/dL — ABNORMAL HIGH (ref 8–23)
CO2: 25 mmol/L (ref 22–32)
Calcium: 8.8 mg/dL — ABNORMAL LOW (ref 8.9–10.3)
Chloride: 102 mmol/L (ref 98–111)
Creatinine, Ser: 1.18 mg/dL (ref 0.61–1.24)
GFR, Estimated: >60 mL/min (ref >60)
Glucose, Bld: 274 mg/dL — ABNORMAL HIGH (ref 70–99)
Potassium: 4.7 mmol/L (ref 3.5–5.1)
Sodium: 135 mmol/L (ref 135–145)
Total Bilirubin: 1.6 mg/dL — ABNORMAL HIGH (ref 0.3–1.2)
Total Protein: 6.1 g/dL — ABNORMAL LOW (ref 6.5–8.1)

## 2022-09-06 LAB — CBC WITH DIFFERENTIAL (CANCER CENTER ONLY)
Abs Immature Granulocytes: 2.45 10*3/uL — ABNORMAL HIGH (ref 0.00–0.07)
Basophils Absolute: 0.3 10*3/uL — ABNORMAL HIGH (ref 0.0–0.1)
Basophils Relative: 1 %
Eosinophils Absolute: 0.6 10*3/uL — ABNORMAL HIGH (ref 0.0–0.5)
Eosinophils Relative: 2 %
HCT: 21.4 % — ABNORMAL LOW (ref 39.0–52.0)
Hemoglobin: 7.3 g/dL — ABNORMAL LOW (ref 13.0–17.0)
Immature Granulocytes: 9 %
Lymphocytes Relative: 2 %
Lymphs Abs: 0.6 10*3/uL — ABNORMAL LOW (ref 0.7–4.0)
MCH: 29.7 pg (ref 26.0–34.0)
MCHC: 34.1 g/dL (ref 30.0–36.0)
MCV: 87 fL (ref 80.0–100.0)
Monocytes Absolute: 1 10*3/uL (ref 0.1–1.0)
Monocytes Relative: 4 %
Neutro Abs: 21.2 10*3/uL — ABNORMAL HIGH (ref 1.7–7.7)
Neutrophils Relative %: 82 %
Platelet Count: 642 10*3/uL — ABNORMAL HIGH (ref 150–400)
RBC: 2.46 MIL/uL — ABNORMAL LOW (ref 4.22–5.81)
RDW: 14.6 % (ref 11.5–15.5)
WBC Count: 26.2 10*3/uL — ABNORMAL HIGH (ref 4.0–10.5)
nRBC: 0 % (ref 0.0–0.2)

## 2022-09-06 LAB — SAMPLE TO BLOOD BANK

## 2022-09-06 LAB — PREPARE RBC (CROSSMATCH)

## 2022-09-06 MED ORDER — ONDANSETRON HCL 8 MG PO TABS
8.0000 mg | ORAL_TABLET | Freq: Once | ORAL | Status: AC
  Administered 2022-09-06: 8 mg via ORAL
  Filled 2022-09-06: qty 1

## 2022-09-06 MED ORDER — SODIUM CHLORIDE 0.9% IV SOLUTION
250.0000 mL | Freq: Once | INTRAVENOUS | Status: AC
  Administered 2022-09-06: 250 mL via INTRAVENOUS

## 2022-09-06 MED ORDER — SODIUM CHLORIDE 0.9 % IV SOLN
75.0000 mg/m2 | Freq: Once | INTRAVENOUS | Status: AC
  Administered 2022-09-06: 140 mg via INTRAVENOUS
  Filled 2022-09-06: qty 14

## 2022-09-06 MED ORDER — METHYLPREDNISOLONE SODIUM SUCC 40 MG IJ SOLR
40.0000 mg | Freq: Once | INTRAMUSCULAR | Status: AC
  Administered 2022-09-06: 40 mg via INTRAVENOUS
  Filled 2022-09-06: qty 1

## 2022-09-06 MED ORDER — ACETAMINOPHEN 325 MG PO TABS
650.0000 mg | ORAL_TABLET | Freq: Once | ORAL | Status: AC
  Administered 2022-09-06: 650 mg via ORAL
  Filled 2022-09-06: qty 2

## 2022-09-06 MED ORDER — SODIUM CHLORIDE 0.9 % IV SOLN: Freq: Once | INTRAVENOUS | Status: AC

## 2022-09-06 NOTE — Progress Notes (Signed)
HEMATOLOGY/ONCOLOGY PROGRESS NOTE:   Date of Service: 09/06/22  Patient Care Team: Lurline Del, DO as PCP - General (Family Medicine)  CHIEF COMPLAINTS:  Follow-up for continued evaluation and management of JAK2 positive myeloproliferative neoplasm/MDS  INTERVAL HISTORY:  [name] is a 76 y.o. male here for continued evaluation and management of his MPN/MDS with refractory anemia thrombocytosis. He is here to start his cycle 1 of Vidaza.  Patient was last seen by me in ED on 09/01/2022. He was admitted to the ED on 08/31/2022 due to persistent LUQ abdominal pain, shortness of breath and weakness. He had an CT angio GI bleed scan on 08/31/2022 which showed significant splenomegaly of 20 x 16 x 11 cm. He was given Fentanyl, Oxycodone, and Morphine for pain management. He reported that Morphine helped relieve his pain more than Fentanyl and Oxycodone. We changed his treatment from [place] to [place].   Patient is here with her wife during today's visit. He reports he is doing better since his ED visit with occasional sharp abdominal pain.  He complains of fatigue since the morning due to his medication. He complains of losing weight in the past month, but his wife reports he has been eating fairly well. He denies abnormal bowl moment.  He denies of noticing abnormal kidney stone passage. But, he noticed dark colored urine with mild blood in the morning.    He is currently taking 16 day regimen of Prednisone and Tramadol 50 mg. He was prescribed these medications by the ED physician.   MEDICAL HISTORY:  Past Medical History:  Diagnosis Date   Arthritis of right knee    Cervicalgia    Chest discomfort    normal stress echo   Chest pain    Colon polyps    Dyslipidemia    Fluttering sensation of heart    GERD without esophagitis    Hematochezia    Hyperglycemia    Hyperlipidemia    Internal hemorrhoids    Kidney stones    Male erectile dysfunction, unspecified     Mixed dyslipidemia    Mixed hyperlipidemia    Ocular migraine    PAC (premature atrial contraction)    Personal history of colonic polyps    Prediabetes    Residual hemorrhoidal skin tags    Unspecified hemorrhoids     SURGICAL HISTORY: Past Surgical History:  Procedure Laterality Date   BONE MARROW BIOPSY     KIDNEY STONE SURGERY     retrieval    SOCIAL HISTORY: Social History   Socioeconomic History   Marital status: Married    Spouse name: Not on file   Number of children: Not on file   Years of education: Not on file   Highest education level: Not on file  Occupational History   Not on file  Tobacco Use   Smoking status: Former    Types: Cigarettes   Smokeless tobacco: Never  Substance and Sexual Activity   Alcohol use: Yes    Alcohol/week: 1.0 standard drink of alcohol    Types: 1 Cans of beer per week   Drug use: Not on file   Sexual activity: Not on file  Other Topics Concern   Not on file  Social History Narrative   Not on file   Social Determinants of Health   Financial Resource Strain: Not on file  Food Insecurity: Unknown (08/31/2022)   Hunger Vital Sign    Worried About Running Out of Food in the Last Year:  Never true    Ran Out of Food in the Last Year: Not on file  Transportation Needs: No Transportation Needs (08/31/2022)   PRAPARE - Hydrologist (Medical): No    Lack of Transportation (Non-Medical): No  Physical Activity: Not on file  Stress: Not on file  Social Connections: Not on file  Intimate Partner Violence: Not on file    FAMILY HISTORY: Family History  Problem Relation Age of Onset   Stroke Brother    Congestive Heart Failure Brother     ALLERGIES:  has No Known Allergies.  MEDICATIONS:  Current Outpatient Medications  Medication Sig Dispense Refill   aspirin EC 81 MG tablet Take 81 mg by mouth daily. Swallow whole.     B Complex Vitamins (B COMPLEX PO) Take 1 tablet by mouth daily.      Cholecalciferol (VITAMIN D3) 50 MCG (2000 UT) TABS Take 2,000 Units by mouth daily.     docusate sodium (COLACE) 100 MG capsule Take 100 mg by mouth 2 (two) times daily.     FIBER PO Take 1 capsule by mouth daily. Unknown strength     fish oil-omega-3 fatty acids 1000 MG capsule Take 1 g by mouth 2 (two) times daily. Strength 372m/1500mg     Flaxseed, Linseed, (FLAXSEED OIL) 1000 MG CAPS Take 1,000 mg by mouth daily.     GLUCOSAMINE CHONDROITIN MSM PO Take 1 tablet by mouth daily. Strength 1500/1500     lidocaine-prilocaine (EMLA) cream Apply to affected area once 30 g 3   Misc Natural Products (PROSTATE SUPPORT PO) Take 1 capsule by mouth daily. Unknown strength     Multiple Vitamin (MULTIVITAMIN) tablet Take 1 tablet by mouth daily. Unknown strength     ondansetron (ZOFRAN) 8 MG tablet Take 1 tablet (8 mg total) by mouth every 8 (eight) hours as needed for nausea or vomiting. 30 tablet 1   predniSONE (DELTASONE) 10 MG tablet 4 tabs daily for 4 days 3 tabs daily for 4 days 2 tabs daily for 4 days 1 tab daily for 4 days 40 tablet 0   prochlorperazine (COMPAZINE) 10 MG tablet Take 1 tablet (10 mg total) by mouth every 6 (six) hours as needed for nausea or vomiting. 30 tablet 1   psyllium (METAMUCIL) 58.6 % powder Take 1 packet by mouth daily.     traMADol (ULTRAM) 50 MG tablet Take 1 tablet (50 mg total) by mouth every 6 (six) hours as needed for up to 5 days. 20 tablet 0   Turmeric (QC TUMERIC COMPLEX PO) Take 750 mg by mouth daily.     vitamin C (ASCORBIC ACID) 250 MG tablet Take 500 mg by mouth daily.     No current facility-administered medications for this visit.    REVIEW OF SYSTEMS:   10 Point review of Systems was done is negative except as noted above.   PHYSICAL EXAMINATION: .BP 125/64   Pulse 90   Temp 98.4 F (36.9 C)   Resp 20   Wt 163 lb 1.6 oz (74 kg)   SpO2 98%   BMI 25.55 kg/m  NAD GENERAL:alert, in no acute distress and comfortable SKIN: no acute rashes, no  significant lesions EYES: conjunctiva are pink and non-injected, sclera anicteric NECK: supple, no JVD LYMPH:  no palpable lymphadenopathy in the cervical, axillary or inguinal regions LUNGS: clear to auscultation b/l with normal respiratory effort HEART: regular rate & rhythm ABDOMEN:  normoactive bowel sounds , non tender, not distended.  Extremity: no pedal edema PSYCH: alert & oriented x 3 with fluent speech NEURO: no focal motor/sensory deficits  LABORATORY DATA:  I have reviewed the data as listed  .    Latest Ref Rng & Units 09/06/2022    9:32 AM 09/01/2022    3:30 AM 08/31/2022    2:20 PM  CBC  WBC 4.0 - 10.5 K/uL 26.2  13.8  16.3   Hemoglobin 13.0 - 17.0 g/dL 7.3  6.7  5.7   Hematocrit 39.0 - 52.0 % 21.4  19.9  17.8   Platelets 150 - 400 K/uL 642  420  540    Iron/TIBC/Ferritin/ %Sat    Component Value Date/Time   IRON 198 (H) 07/19/2022 1344   TIBC 210 (L) 07/19/2022 1344   FERRITIN 933 (H) 07/19/2022 1343   IRONPCTSAT 94 (H) 07/19/2022 1344  .    Latest Ref Rng & Units 09/06/2022    9:38 AM 09/01/2022    3:30 AM 08/31/2022    2:20 PM  CMP  Glucose 70 - 99 mg/dL 274  136  130   BUN 8 - 23 mg/dL _0 Creatinine 0.61 - 1.24 mg/dL 1.18  0.90  0.92   Sodium 135 - 145 mmol/L 135  135  137   Potassium 3.5 - 5.1 mmol/L 4.7  4.4  4.4   Chloride 98 - 111 mmol/L 102  106  106   CO2 22 - 32 mmol/L _1 Calcium 8.9 - 10.3 mg/dL 8.8  8.1  8.5   Total Protein 6.5 - 8.1 g/dL 6.1   6.4   Total Bilirubin 0.3 - 1.2 mg/dL 1.6   1.8   Alkaline Phos 38 - 126 U/L 77   79   AST 15 - 41 U/L 14   24   ALT 0 - 44 U/L 19   13    . Lab Results  Component Value Date   LDH 110 01/04/2022    02/23/2021 BCR ABL    02/23/2021 JAK2   Surgical Pathology  CASE: [id number]  PATIENT: Jaelin Holifield  Bone Marrow Report      Clinical History: MPN with progressive anemia  (BH)  DIAGNOSIS:   BONE MARROW, ASPIRATE, CLOT, CORE:  -Hypercellular bone marrow  with features of myeloid neoplasm  -See comment   PERIPHERAL BLOOD:  -Macrocytic anemia  -Leukocytosis  -Thrombocytosis   COMMENT:   The bone marrow/peripheral blood show persistent involvement by  previously known myeloid neoplasm.  There is a myeloproliferative  component as supported by previous JAK2 positivity.  However, there are  also dyspoietic changes primarily involving the megakaryocytic cell line  with numerous hypolobated/unilobated forms in addition to  dysgranulopoiesis to a lesser extent.  This is associated with  eosinophilia.  It is not entirely clear whether the overall findings  represent a myeloproliferative neoplasm with treatment related changes  or represent a primary myeloproliferative/myelodysplastic neoplasm  including but not limited to myeloid neoplasms with eosinophilia.  Correlation with cytogenetic and FISH studies strongly recommended.      RADIOGRAPHIC STUDIES: I have personally reviewed the radiological images as listed and agreed with the findings in the report. CT ANGIO GI BLEED  Result Date: 08/31/2022 CLINICAL DATA:  Acute mesenteric ischemia. Abdominal pain. History of myelodysplastic syndrome. EXAM: CTA ABDOMEN AND PELVIS WITHOUT AND WITH CONTRAST TECHNIQUE: Multidetector CT imaging of the abdomen and pelvis was performed using the standard protocol during bolus administration of intravenous contrast.  Multiplanar reconstructed images and MIPs were obtained and reviewed to evaluate the vascular anatomy. RADIATION DOSE REDUCTION: This exam was performed according to the departmental dose-optimization program which includes automated exposure control, adjustment of the mA and/or kV according to patient size and/or use of iterative reconstruction technique. CONTRAST:  110m OMNIPAQUE IOHEXOL 350 MG/ML SOLN COMPARISON:  Abdominal ultrasound examination 04/03/2021 FINDINGS: VASCULAR Aorta: Normal caliber abdominal aorta. Scattered atherosclerotic  calcifications. No dissection. Celiac: Normal SMA: Normal Renals: Normal IMA: Patent Inflow: Normal Proximal Outflow: Normal Veins: Normal Review of the MIP images confirms the above findings. NON-VASCULAR Lower chest: Small left pleural effusion and bibasilar atelectasis. The heart is normal in size. No pericardial effusion. There is a moderate to large hiatal hernia. Hepatobiliary: No hepatic lesions or intrahepatic biliary dilatation. The gallbladder is unremarkable. No common bile duct dilatation. Pancreas: No mass, inflammation or ductal dilatation. Spleen: Massive splenomegaly. The spleen measures 20 x 16 x 11 cm. No splenic lesions or splenic infarct. Adrenals/Urinary Tract: The left kidney is displaced medially and inferiorly by the enlarged spleen. No worrisome renal lesions,, hydronephrosis or pyelonephritis. There is a lower pole right renal calculus and there are 2 distal left ureteral calculi more proximal calculus measures 4 mm and the more distal calculus measures 5 mm. I do not see any hydroureter or hydronephrosis. Stomach/Bowel: The stomach, duodenum, small bowel and colon are grossly normal. No inflammatory changes, mass lesions or obstructive findings. Lymphatic: No abdominal or pelvic lymphadenopathy. Reproductive: The prostate gland is enlarged. The seminal vesicles are unremarkable. Other: No pelvic mass or adenopathy. No free pelvic fluid collections. No inguinal mass or adenopathy. No abdominal wall hernia or subcutaneous lesions. Musculoskeletal: No significant bony findings. IMPRESSION: 1. Normal caliber abdominal aorta and no dissection. The branch vessels are normal. 2. Splenomegaly. 3. Two distal left ureteral calculi but no hydroureter or hydronephrosis. 4. Lower pole right renal calculus. 5. Moderate to large hiatal hernia. 6. Small left pleural effusion and bibasilar atelectasis. Electronically Signed   By: PMarijo SanesM.D.   On: 08/31/2022 18:13     ASSESSMENT & PLAN:    #1  JAK2-positive myeloproliferative neoplasm-primarily presenting with thrombocytosis and associated MDS causing refractory anemia -No polycythemia.  -Mild leukocytosis.  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 Has grade 1 out of 3 reticulin fibrosis.  Uncertain if this is primary or secondary. -LDH has remained within normal limits. 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  PLAN: -Labs were reviewed in detail. WBC counts are 26.2 K, Hemoglobin at 7.3 K, and platelet at 642K. CMP stable. -Discussed his CT Angio GI bled scan from 09/01/2022 with the patient and his wife. CT angio GI bleed scan results showed kidney stones.and significant splenomegaly.  -Recommended to drink more water to push kidney stone.  -Port scheduled for 09/14/2022. Answered patient's questions regarding his port.  -Educated the patient and his wife on the difference between VGreeceand LCogswell Answered all patient's questions on his new treatment.  -Answered all of patient's and his wife's question about his MPN/MDS.  -patient appropriate to proceed with C1 of Vidaza from today.  Follow-up: Labs and 1 unit of PRBC weekly x 8 MD visit in 2 weeks with labs for toxicity check C2 of Vdiaza per integrated scheduling  The total time spent in the appointment was 30 minutes* .  All of the patient's questions were answered with apparent satisfaction. The patient knows to call the clinic with any problems, questions or concerns.   GSullivan Lone  MD MS AAHIVMS [facility] Hematology/Oncology Physician [facility]  .*Total Encounter Time as defined by the Centers for Medicare and Medicaid Services includes, in addition to the face-to-face time of a patient visit (documented in the note above) non-face-to-face time: obtaining and reviewing outside history, ordering and reviewing medications, tests or procedures, care coordination (communications with other health care  professionals or caregivers) and documentation in the medical record.   I, Cleda Mccreedy, am acting as a Education administrator for Sullivan Lone, MD.  .I have reviewed the above documentation for accuracy and completeness, and I agree with the above. Brunetta Genera MD

## 2022-09-06 NOTE — Patient Instructions (Addendum)
Blood Transfusion, Adult, Care After The following information offers guidance on how to care for yourself after your procedure. Your health care provider may also give you more specific instructions. If you have problems or questions, contact your health care provider. What can I expect after the procedure? After the procedure, it is common to have: Bruising and soreness where the IV was inserted. A headache. Follow these instructions at home: IV insertion site care     Follow instructions from your health care provider about how to take care of your IV insertion site. Make sure you: Wash your hands with soap and water for at least 20 seconds before and after you change your bandage (dressing). If soap and water are not available, use hand sanitizer. Change your dressing as told by your health care provider. Check your IV insertion site every day for signs of infection. Check for: Redness, swelling, or pain. Bleeding from the site. Warmth. Pus or a bad smell. General instructions Take over-the-counter and prescription medicines only as told by your health care provider. Rest as told by your health care provider. Return to your normal activities as told by your health care provider. Keep all follow-up visits. Lab tests may need to be done at certain periods to recheck your blood counts. Contact a health care provider if: You have itching or red, swollen areas of skin (hives). You have a fever or chills. You have pain in the head, back, or chest. You feel anxious or you feel weak after doing your normal activities. You have redness, swelling, warmth, or pain around the IV insertion site. You have blood coming from the IV insertion site that does not stop with pressure. You have pus or a bad smell coming from your IV insertion site. If you received your blood transfusion in an outpatient setting, you will be told whom to contact to report any reactions. Get help right away if: You  have symptoms of a serious allergic or immune system reaction, including: Trouble breathing or shortness of breath. Swelling of the face, feeling flushed, or widespread rash. Dark urine or blood in the urine. Fast heartbeat. These symptoms may be an emergency. Get help right away. Call 911. Do not wait to see if the symptoms will go away. Do not drive yourself to the hospital. Summary Bruising and soreness around the IV insertion site are common. Check your IV insertion site every day for signs of infection. Rest as told by your health care provider. Return to your normal activities as told by your health care provider. Get help right away for symptoms of a serious allergic or immune system reaction to the blood transfusion. This information is not intended to replace advice given to you by your health care provider. Make sure you discuss any questions you have with your health care provider. Document Revised: 01/01/2022 Document Reviewed: 01/01/2022 Elsevier Patient Education  [facility]  Discharge Instructions: Thank you for choosing [facility] to provide your oncology and hematology care.   If you have a lab appointment with the [facility], please go directly to the [facility] and check in at the registration area.   Wear comfortable clothing and clothing appropriate for easy access to any Portacath or PICC line.   We strive to give you quality time with your provider. You may need to reschedule your appointment if you arrive late (15 or more minutes).  Arriving late affects you and other patients  whose appointments are after yours.  Also, if you miss three or more appointments without notifying the office, you may be dismissed from the clinic at the provider's discretion.      For prescription refill requests, have your pharmacy contact our office and allow 72 hours for refills to be completed.    Today you  received the following chemotherapy and/or immunotherapy agent: Azacitidine (Vidaza).   To help prevent nausea and vomiting after your treatment, we encourage you to take your nausea medication as directed.  BELOW ARE SYMPTOMS THAT SHOULD BE REPORTED IMMEDIATELY: *FEVER GREATER THAN 100.4 F (38 C) OR HIGHER *CHILLS OR SWEATING *NAUSEA AND VOMITING THAT IS NOT CONTROLLED WITH YOUR NAUSEA MEDICATION *UNUSUAL SHORTNESS OF BREATH *UNUSUAL BRUISING OR BLEEDING *URINARY PROBLEMS (pain or burning when urinating, or frequent urination) *BOWEL PROBLEMS (unusual diarrhea, constipation, pain near the anus) TENDERNESS IN MOUTH AND THROAT WITH OR WITHOUT PRESENCE OF ULCERS (sore throat, sores in mouth, or a toothache) UNUSUAL RASH, SWELLING OR PAIN  UNUSUAL VAGINAL DISCHARGE OR ITCHING   Items with * indicate a potential emergency and should be followed up as soon as possible or go to the Emergency Department if any problems should occur.  Please show the CHEMOTHERAPY ALERT CARD or IMMUNOTHERAPY ALERT CARD at check-in to the Emergency Department and triage nurse.  Should you have questions after your visit or need to cancel or reschedule your appointment, please contact [facility]  Dept: [phone]  and follow the prompts.  Office hours are 8:00 a.m. to 4:30 p.m. Monday - Friday. Please note that voicemails left after 4:00 p.m. may not be returned until the following business day.  We are closed weekends and major holidays. You have access to a nurse at all times for urgent questions. Please call the main number to the clinic Dept: [phone] and follow the prompts.   For any non-urgent questions, you may also contact your provider using MyChart. We now offer e-Visits for anyone 82 and older to request care online for non-urgent symptoms. For details visit [email or web address].   Also download the MyChart app! Go to the app store, search "MyChart", open the app,  select [facility], and log in with your MyChart username and password.  Masks are optional in the cancer centers. If you would like for your care team to wear a mask while they are taking care of you, please let them know. You may have one support person who is at least 78 years old accompany you for your appointments.  Azacitidine Injection What is this medication? AZACITIDINE (ay [place]) treats blood and bone marrow cancers. It works by slowing down the growth of cancer cells. This medicine may be used for other purposes; ask your health care provider or pharmacist if you have questions. COMMON BRAND NAME(S): Vidaza What should I tell my care team before I take this medication? They need to know if you have any of these conditions: Kidney disease Liver disease Low blood cell levels, such as low white cells, platelets, or red blood cells Low levels of albumin in the blood Low levels of bicarbonate in the blood An unusual or allergic reaction to azacitidine, mannitol, other medications, foods, dyes, or preservatives If you or your partner are pregnant or trying to get pregnant Breast-feeding How should I use this medication? This medication is injected into a vein or under the skin. It is given by your care team in a hospital or clinic setting. Talk  to your care team about the use of this medication in children. While it may be prescribed for children as young as 1 month for selected conditions, precautions do apply. Overdosage: If you think you have taken too much of this medicine contact a poison control center or emergency room at once. NOTE: This medicine is only for you. Do not share this medicine with others. What if I miss a dose? Keep appointments for follow-up doses. It is important not to miss your dose. Call your care team if you are unable to keep an appointment. What may interact with this medication? Interactions are not expected. This list may not describe all  possible interactions. Give your health care provider a list of all the medicines, herbs, non-prescription drugs, or dietary supplements you use. Also tell them if you smoke, drink alcohol, or use illegal drugs. Some items may interact with your medicine. What should I watch for while using this medication? Your condition will be monitored carefully while you are receiving this medication. You may need blood work while taking this medication. This medication may make you feel generally unwell. This is not uncommon as chemotherapy can affect healthy cells as well as cancer cells. Report any side effects. Continue your course of treatment even though you feel ill unless your care team tells you to stop. Other product types may be available that contain the medication azacitidine. The injection and oral products should not be used in place of one another. Talk to your care team if you have questions. This medication can cause serious side effects. To reduce the risk, your care team may give you other medications to take before receiving this one. Be sure to follow the directions from your care team. This medication may increase your risk of getting an infection. Call your care team for advice if you get a fever, chills, sore throat, or other symptoms of a cold or flu. Do not treat yourself. Try to avoid being around people who are sick. Avoid taking medications that contain aspirin, acetaminophen, ibuprofen, naproxen, or ketoprofen unless instructed by your care team. These medications may hide a fever. This medication may increase your risk to bruise or bleed. Call your care team if you notice any unusual bleeding. Be careful brushing or flossing your teeth or using a toothpick because you may get an infection or bleed more easily. If you have any dental work done, tell your dentist you are receiving this medication. Talk to your care team if you or your partner may be pregnant. Serious birth defects can  occur if you take this medication during pregnancy and for 6 months after the last dose. You will need a negative pregnancy test before starting this medication. Contraception is recommended while taking his medication and for 6 months after the last dose. Your care team can help you find the option that works for you. If your partner can get pregnant, use a condom during sex while taking this medication and for 3 months after the last dose. Do not breastfeed while taking this medication and for 1 week after the last dose. This medication may cause infertility. Talk to your care team if you are concerned about your fertility. What side effects may I notice from receiving this medication? Side effects that you should report to your care team as soon as possible: Allergic reactions--skin rash, itching, hives, swelling of the face, lips, tongue, or throat Infection--fever, chills, cough, sore throat, wounds that don't heal, pain or trouble when  passing urine, general feeling of discomfort or being unwell Kidney injury--decrease in the amount of urine, swelling of the ankles, hands, or feet Liver injury--right upper belly pain, loss of appetite, nausea, light-colored stool, dark yellow or brown urine, yellowing skin or eyes, unusual weakness or fatigue Low red blood cell level--unusual weakness or fatigue, dizziness, headache, trouble breathing Tumor lysis syndrome (TLS)--nausea, vomiting, diarrhea, decrease in the amount of urine, dark urine, unusual weakness or fatigue, confusion, muscle pain or cramps, fast or irregular heartbeat, joint pain Unusual bruising or bleeding Side effects that usually do not require medical attention (report to your care team if they continue or are bothersome): Constipation Diarrhea Nausea Pain, redness, or irritation at injection site Vomiting This list may not describe all possible side effects. Call your doctor for medical advice about side effects. You may report  side effects to FDA at 1-800-FDA-1088. Where should I keep my medication? This medication is given in a hospital or clinic. It will not be stored at home. NOTE: This sheet is a summary. It may not cover all possible information. If you have questions about this medicine, talk to your doctor, pharmacist, or health care provider.  2023 Elsevier/Gold Standard (2022-02-18 00:00:00)

## 2022-09-07 ENCOUNTER — Inpatient Hospital Stay: Payer: Medicare Other

## 2022-09-07 ENCOUNTER — Telehealth: Payer: Self-pay

## 2022-09-07 VITALS — BP 114/70 | HR 79 | Temp 98.2°F | Resp 17

## 2022-09-07 DIAGNOSIS — R5383 Other fatigue: Secondary | ICD-10-CM | POA: Diagnosis not present

## 2022-09-07 DIAGNOSIS — K449 Diaphragmatic hernia without obstruction or gangrene: Secondary | ICD-10-CM | POA: Diagnosis not present

## 2022-09-07 DIAGNOSIS — K219 Gastro-esophageal reflux disease without esophagitis: Secondary | ICD-10-CM | POA: Diagnosis not present

## 2022-09-07 DIAGNOSIS — N202 Calculus of kidney with calculus of ureter: Secondary | ICD-10-CM | POA: Diagnosis not present

## 2022-09-07 DIAGNOSIS — E785 Hyperlipidemia, unspecified: Secondary | ICD-10-CM | POA: Diagnosis not present

## 2022-09-07 DIAGNOSIS — Z87891 Personal history of nicotine dependence: Secondary | ICD-10-CM | POA: Diagnosis not present

## 2022-09-07 DIAGNOSIS — R161 Splenomegaly, not elsewhere classified: Secondary | ICD-10-CM | POA: Diagnosis not present

## 2022-09-07 DIAGNOSIS — Z7952 Long term (current) use of systemic steroids: Secondary | ICD-10-CM | POA: Diagnosis not present

## 2022-09-07 DIAGNOSIS — Z79899 Other long term (current) drug therapy: Secondary | ICD-10-CM | POA: Diagnosis not present

## 2022-09-07 DIAGNOSIS — Z87442 Personal history of urinary calculi: Secondary | ICD-10-CM | POA: Diagnosis not present

## 2022-09-07 DIAGNOSIS — Z86010 Personal history of colonic polyps: Secondary | ICD-10-CM | POA: Diagnosis not present

## 2022-09-07 DIAGNOSIS — Z7982 Long term (current) use of aspirin: Secondary | ICD-10-CM | POA: Diagnosis not present

## 2022-09-07 DIAGNOSIS — D469 Myelodysplastic syndrome, unspecified: Secondary | ICD-10-CM

## 2022-09-07 DIAGNOSIS — D649 Anemia, unspecified: Secondary | ICD-10-CM

## 2022-09-07 LAB — TYPE AND SCREEN
ABO/RH(D): A POS
Antibody Screen: NEGATIVE
Unit division: 0

## 2022-09-07 LAB — BPAM RBC
Blood Product Expiration Date: 202312142359
ISSUE DATE / TIME: 202311201352
Unit Type and Rh: 6200

## 2022-09-07 MED ORDER — ONDANSETRON HCL 8 MG PO TABS
8.0000 mg | ORAL_TABLET | Freq: Once | ORAL | Status: AC
  Administered 2022-09-07: 8 mg via ORAL
  Filled 2022-09-07: qty 1

## 2022-09-07 MED ORDER — SODIUM CHLORIDE 0.9 % IV SOLN
75.0000 mg/m2 | Freq: Once | INTRAVENOUS | Status: AC
  Administered 2022-09-07: 140 mg via INTRAVENOUS
  Filled 2022-09-07: qty 14

## 2022-09-07 MED ORDER — SODIUM CHLORIDE 0.9 % IV SOLN: Freq: Once | INTRAVENOUS | Status: AC

## 2022-09-07 NOTE — Telephone Encounter (Signed)
Pt doing well. He is actually receiving the medication for 5 days. Will call him again next week.

## 2022-09-07 NOTE — Patient Instructions (Signed)
[facility]  Discharge Instructions: Thank you for choosing [facility] to provide your oncology and hematology care.   If you have a lab appointment with the Cancer Center, please go directly to the Cancer Center and check in at the registration area.   Wear comfortable clothing and clothing appropriate for easy access to any Portacath or PICC line.   We strive to give you quality time with your provider. You may need to reschedule your appointment if you arrive late (15 or more minutes).  Arriving late affects you and other patients whose appointments are after yours.  Also, if you miss three or more appointments without notifying the office, you may be dismissed from the clinic at the provider's discretion.      For prescription refill requests, have your pharmacy contact our office and allow 72 hours for refills to be completed.    Today you received the following chemotherapy and/or immunotherapy agents: Vidaza      To help prevent nausea and vomiting after your treatment, we encourage you to take your nausea medication as directed.  BELOW ARE SYMPTOMS THAT SHOULD BE REPORTED IMMEDIATELY: *FEVER GREATER THAN 100.4 F (38 C) OR HIGHER *CHILLS OR SWEATING *NAUSEA AND VOMITING THAT IS NOT CONTROLLED WITH YOUR NAUSEA MEDICATION *UNUSUAL SHORTNESS OF BREATH *UNUSUAL BRUISING OR BLEEDING *URINARY PROBLEMS (pain or burning when urinating, or frequent urination) *BOWEL PROBLEMS (unusual diarrhea, constipation, pain near the anus) TENDERNESS IN MOUTH AND THROAT WITH OR WITHOUT PRESENCE OF ULCERS (sore throat, sores in mouth, or a toothache) UNUSUAL RASH, SWELLING OR PAIN  UNUSUAL VAGINAL DISCHARGE OR ITCHING   Items with * indicate a potential emergency and should be followed up as soon as possible or go to the Emergency Department if any problems should occur.  Please show the CHEMOTHERAPY ALERT CARD or IMMUNOTHERAPY ALERT CARD at check-in to the  Emergency Department and triage nurse.  Should you have questions after your visit or need to cancel or reschedule your appointment, please contact [facility]  Dept: [phone]  and follow the prompts.  Office hours are 8:00 a.m. to 4:30 p.m. Monday - Friday. Please note that voicemails left after 4:00 p.m. may not be returned until the following business day.  We are closed weekends and major holidays. You have access to a nurse at all times for urgent questions. Please call the main number to the clinic Dept: [phone] and follow the prompts.   For any non-urgent questions, you may also contact your provider using MyChart. We now offer e-Visits for anyone 18 and older to request care online for non-urgent symptoms. For details visit [email or web address].   Also download the MyChart app! Go to the app store, search "MyChart", open the app, select [facility], and log in with your MyChart username and password.  Masks are optional in the cancer centers. If you would like for your care team to wear a mask while they are taking care of you, please let them know. You may have one support Ota Ebersole who is at least 78 years old accompany you for your appointments. 

## 2022-09-07 NOTE — Telephone Encounter (Signed)
-----   Message from Lester [place], RN sent at 09/06/2022  4:12 PM EST ----- Regarding: Dr. Irene Limbo Pt. first time Vidaza. Tolerated well, no issues noted. Hemoglobin was 7.3 and he also received 1 unit of blood.

## 2022-09-08 ENCOUNTER — Inpatient Hospital Stay: Payer: Medicare Other

## 2022-09-08 ENCOUNTER — Other Ambulatory Visit: Payer: Self-pay

## 2022-09-08 VITALS — BP 105/64 | HR 74 | Temp 98.0°F | Resp 17

## 2022-09-08 DIAGNOSIS — Z86010 Personal history of colonic polyps: Secondary | ICD-10-CM | POA: Diagnosis not present

## 2022-09-08 DIAGNOSIS — E785 Hyperlipidemia, unspecified: Secondary | ICD-10-CM | POA: Diagnosis not present

## 2022-09-08 DIAGNOSIS — D649 Anemia, unspecified: Secondary | ICD-10-CM

## 2022-09-08 DIAGNOSIS — D469 Myelodysplastic syndrome, unspecified: Secondary | ICD-10-CM

## 2022-09-08 DIAGNOSIS — K449 Diaphragmatic hernia without obstruction or gangrene: Secondary | ICD-10-CM | POA: Diagnosis not present

## 2022-09-08 DIAGNOSIS — Z7952 Long term (current) use of systemic steroids: Secondary | ICD-10-CM | POA: Diagnosis not present

## 2022-09-08 DIAGNOSIS — Z87442 Personal history of urinary calculi: Secondary | ICD-10-CM | POA: Diagnosis not present

## 2022-09-08 DIAGNOSIS — Z87891 Personal history of nicotine dependence: Secondary | ICD-10-CM | POA: Diagnosis not present

## 2022-09-08 DIAGNOSIS — R5383 Other fatigue: Secondary | ICD-10-CM | POA: Diagnosis not present

## 2022-09-08 DIAGNOSIS — R161 Splenomegaly, not elsewhere classified: Secondary | ICD-10-CM | POA: Diagnosis not present

## 2022-09-08 DIAGNOSIS — N202 Calculus of kidney with calculus of ureter: Secondary | ICD-10-CM | POA: Diagnosis not present

## 2022-09-08 DIAGNOSIS — K219 Gastro-esophageal reflux disease without esophagitis: Secondary | ICD-10-CM | POA: Diagnosis not present

## 2022-09-08 DIAGNOSIS — Z7982 Long term (current) use of aspirin: Secondary | ICD-10-CM | POA: Diagnosis not present

## 2022-09-08 DIAGNOSIS — Z79899 Other long term (current) drug therapy: Secondary | ICD-10-CM | POA: Diagnosis not present

## 2022-09-08 MED ORDER — SODIUM CHLORIDE 0.9 % IV SOLN
75.0000 mg/m2 | Freq: Once | INTRAVENOUS | Status: AC
  Administered 2022-09-08: 140 mg via INTRAVENOUS
  Filled 2022-09-08: qty 14

## 2022-09-08 MED ORDER — SODIUM CHLORIDE 0.9 % IV SOLN: Freq: Once | INTRAVENOUS | Status: AC

## 2022-09-08 MED ORDER — ONDANSETRON HCL 8 MG PO TABS
8.0000 mg | ORAL_TABLET | Freq: Once | ORAL | Status: AC
  Administered 2022-09-08: 8 mg via ORAL
  Filled 2022-09-08: qty 1

## 2022-09-08 NOTE — Patient Instructions (Signed)
[facility]  Discharge Instructions: Thank you for choosing [facility] to provide your oncology and hematology care.   If you have a lab appointment with the Cancer Center, please go directly to the Cancer Center and check in at the registration area.   Wear comfortable clothing and clothing appropriate for easy access to any Portacath or PICC line.   We strive to give you quality time with your provider. You may need to reschedule your appointment if you arrive late (15 or more minutes).  Arriving late affects you and other patients whose appointments are after yours.  Also, if you miss three or more appointments without notifying the office, you may be dismissed from the clinic at the provider's discretion.      For prescription refill requests, have your pharmacy contact our office and allow 72 hours for refills to be completed.    Today you received the following chemotherapy and/or immunotherapy agents: Vidaza      To help prevent nausea and vomiting after your treatment, we encourage you to take your nausea medication as directed.  BELOW ARE SYMPTOMS THAT SHOULD BE REPORTED IMMEDIATELY: *FEVER GREATER THAN 100.4 F (38 C) OR HIGHER *CHILLS OR SWEATING *NAUSEA AND VOMITING THAT IS NOT CONTROLLED WITH YOUR NAUSEA MEDICATION *UNUSUAL SHORTNESS OF BREATH *UNUSUAL BRUISING OR BLEEDING *URINARY PROBLEMS (pain or burning when urinating, or frequent urination) *BOWEL PROBLEMS (unusual diarrhea, constipation, pain near the anus) TENDERNESS IN MOUTH AND THROAT WITH OR WITHOUT PRESENCE OF ULCERS (sore throat, sores in mouth, or a toothache) UNUSUAL RASH, SWELLING OR PAIN  UNUSUAL VAGINAL DISCHARGE OR ITCHING   Items with * indicate a potential emergency and should be followed up as soon as possible or go to the Emergency Department if any problems should occur.  Please show the CHEMOTHERAPY ALERT CARD or IMMUNOTHERAPY ALERT CARD at check-in to the  Emergency Department and triage nurse.  Should you have questions after your visit or need to cancel or reschedule your appointment, please contact [facility]  Dept: [phone]  and follow the prompts.  Office hours are 8:00 a.m. to 4:30 p.m. Monday - Friday. Please note that voicemails left after 4:00 p.m. may not be returned until the following business day.  We are closed weekends and major holidays. You have access to a nurse at all times for urgent questions. Please call the main number to the clinic Dept: [phone] and follow the prompts.   For any non-urgent questions, you may also contact your provider using MyChart. We now offer e-Visits for anyone 18 and older to request care online for non-urgent symptoms. For details visit [email or web address].   Also download the MyChart app! Go to the app store, search "MyChart", open the app, select [facility], and log in with your MyChart username and password.  Masks are optional in the cancer centers. If you would like for your care team to wear a mask while they are taking care of you, please let them know. You may have one support person who is at least 78 years old accompany you for your appointments. 

## 2022-09-10 ENCOUNTER — Other Ambulatory Visit: Payer: Self-pay

## 2022-09-10 ENCOUNTER — Inpatient Hospital Stay: Payer: Medicare Other

## 2022-09-10 VITALS — BP 123/71 | HR 92 | Temp 97.7°F | Resp 16 | Wt 164.5 lb

## 2022-09-10 DIAGNOSIS — Z86010 Personal history of colonic polyps: Secondary | ICD-10-CM | POA: Diagnosis not present

## 2022-09-10 DIAGNOSIS — E785 Hyperlipidemia, unspecified: Secondary | ICD-10-CM | POA: Diagnosis not present

## 2022-09-10 DIAGNOSIS — N202 Calculus of kidney with calculus of ureter: Secondary | ICD-10-CM | POA: Diagnosis not present

## 2022-09-10 DIAGNOSIS — D469 Myelodysplastic syndrome, unspecified: Secondary | ICD-10-CM | POA: Diagnosis not present

## 2022-09-10 DIAGNOSIS — Z87891 Personal history of nicotine dependence: Secondary | ICD-10-CM | POA: Diagnosis not present

## 2022-09-10 DIAGNOSIS — D649 Anemia, unspecified: Secondary | ICD-10-CM

## 2022-09-10 DIAGNOSIS — Z7952 Long term (current) use of systemic steroids: Secondary | ICD-10-CM | POA: Diagnosis not present

## 2022-09-10 DIAGNOSIS — R5383 Other fatigue: Secondary | ICD-10-CM | POA: Diagnosis not present

## 2022-09-10 DIAGNOSIS — K449 Diaphragmatic hernia without obstruction or gangrene: Secondary | ICD-10-CM | POA: Diagnosis not present

## 2022-09-10 DIAGNOSIS — Z87442 Personal history of urinary calculi: Secondary | ICD-10-CM | POA: Diagnosis not present

## 2022-09-10 DIAGNOSIS — Z79899 Other long term (current) drug therapy: Secondary | ICD-10-CM | POA: Diagnosis not present

## 2022-09-10 DIAGNOSIS — Z7982 Long term (current) use of aspirin: Secondary | ICD-10-CM | POA: Diagnosis not present

## 2022-09-10 DIAGNOSIS — R161 Splenomegaly, not elsewhere classified: Secondary | ICD-10-CM | POA: Diagnosis not present

## 2022-09-10 DIAGNOSIS — K219 Gastro-esophageal reflux disease without esophagitis: Secondary | ICD-10-CM | POA: Diagnosis not present

## 2022-09-10 MED ORDER — SODIUM CHLORIDE 0.9 % IV SOLN
75.0000 mg/m2 | Freq: Once | INTRAVENOUS | Status: AC
  Administered 2022-09-10: 140 mg via INTRAVENOUS
  Filled 2022-09-10: qty 14

## 2022-09-10 MED ORDER — ONDANSETRON HCL 8 MG PO TABS
8.0000 mg | ORAL_TABLET | Freq: Once | ORAL | Status: AC
  Administered 2022-09-10: 8 mg via ORAL
  Filled 2022-09-10: qty 1

## 2022-09-10 MED ORDER — SODIUM CHLORIDE 0.9 % IV SOLN: Freq: Once | INTRAVENOUS | Status: AC

## 2022-09-10 NOTE — Patient Instructions (Signed)
[facility]  Discharge Instructions: Thank you for choosing [facility] to provide your oncology and hematology care.   If you have a lab appointment with the Cancer Center, please go directly to the Cancer Center and check in at the registration area.   Wear comfortable clothing and clothing appropriate for easy access to any Portacath or PICC line.   We strive to give you quality time with your provider. You may need to reschedule your appointment if you arrive late (15 or more minutes).  Arriving late affects you and other patients whose appointments are after yours.  Also, if you miss three or more appointments without notifying the office, you may be dismissed from the clinic at the provider's discretion.      For prescription refill requests, have your pharmacy contact our office and allow 72 hours for refills to be completed.    Today you received the following chemotherapy and/or immunotherapy agents: Vidaza      To help prevent nausea and vomiting after your treatment, we encourage you to take your nausea medication as directed.  BELOW ARE SYMPTOMS THAT SHOULD BE REPORTED IMMEDIATELY: *FEVER GREATER THAN 100.4 F (38 C) OR HIGHER *CHILLS OR SWEATING *NAUSEA AND VOMITING THAT IS NOT CONTROLLED WITH YOUR NAUSEA MEDICATION *UNUSUAL SHORTNESS OF BREATH *UNUSUAL BRUISING OR BLEEDING *URINARY PROBLEMS (pain or burning when urinating, or frequent urination) *BOWEL PROBLEMS (unusual diarrhea, constipation, pain near the anus) TENDERNESS IN MOUTH AND THROAT WITH OR WITHOUT PRESENCE OF ULCERS (sore throat, sores in mouth, or a toothache) UNUSUAL RASH, SWELLING OR PAIN  UNUSUAL VAGINAL DISCHARGE OR ITCHING   Items with * indicate a potential emergency and should be followed up as soon as possible or go to the Emergency Department if any problems should occur.  Please show the CHEMOTHERAPY ALERT CARD or IMMUNOTHERAPY ALERT CARD at check-in to the  Emergency Department and triage nurse.  Should you have questions after your visit or need to cancel or reschedule your appointment, please contact [facility]  Dept: [phone]  and follow the prompts.  Office hours are 8:00 a.m. to 4:30 p.m. Monday - Friday. Please note that voicemails left after 4:00 p.m. may not be returned until the following business day.  We are closed weekends and major holidays. You have access to a nurse at all times for urgent questions. Please call the main number to the clinic Dept: [phone] and follow the prompts.   For any non-urgent questions, you may also contact your provider using MyChart. We now offer e-Visits for anyone 18 and older to request care online for non-urgent symptoms. For details visit [email or web address].   Also download the MyChart app! Go to the app store, search "MyChart", open the app, select , and log in with your MyChart username and password.  Masks are optional in the cancer centers. If you would like for your care team to wear a mask while they are taking care of you, please let them know. You may have one support person who is at least 78 years old accompany you for your appointments. 

## 2022-09-13 ENCOUNTER — Other Ambulatory Visit: Payer: Self-pay

## 2022-09-13 ENCOUNTER — Other Ambulatory Visit: Payer: Self-pay | Admitting: *Deleted

## 2022-09-13 ENCOUNTER — Other Ambulatory Visit: Payer: Self-pay | Admitting: Radiology

## 2022-09-13 ENCOUNTER — Telehealth: Payer: Self-pay | Admitting: *Deleted

## 2022-09-13 ENCOUNTER — Inpatient Hospital Stay: Payer: Medicare Other

## 2022-09-13 ENCOUNTER — Encounter: Payer: Self-pay | Admitting: Hematology

## 2022-09-13 VITALS — BP 99/66 | HR 73 | Temp 98.2°F | Resp 18 | Wt 162.8 lb

## 2022-09-13 DIAGNOSIS — E785 Hyperlipidemia, unspecified: Secondary | ICD-10-CM | POA: Diagnosis not present

## 2022-09-13 DIAGNOSIS — Z7952 Long term (current) use of systemic steroids: Secondary | ICD-10-CM | POA: Diagnosis not present

## 2022-09-13 DIAGNOSIS — D469 Myelodysplastic syndrome, unspecified: Secondary | ICD-10-CM

## 2022-09-13 DIAGNOSIS — N202 Calculus of kidney with calculus of ureter: Secondary | ICD-10-CM | POA: Diagnosis not present

## 2022-09-13 DIAGNOSIS — K449 Diaphragmatic hernia without obstruction or gangrene: Secondary | ICD-10-CM | POA: Diagnosis not present

## 2022-09-13 DIAGNOSIS — Z87891 Personal history of nicotine dependence: Secondary | ICD-10-CM | POA: Diagnosis not present

## 2022-09-13 DIAGNOSIS — R5383 Other fatigue: Secondary | ICD-10-CM | POA: Diagnosis not present

## 2022-09-13 DIAGNOSIS — R161 Splenomegaly, not elsewhere classified: Secondary | ICD-10-CM | POA: Diagnosis not present

## 2022-09-13 DIAGNOSIS — Z79899 Other long term (current) drug therapy: Secondary | ICD-10-CM | POA: Diagnosis not present

## 2022-09-13 DIAGNOSIS — K219 Gastro-esophageal reflux disease without esophagitis: Secondary | ICD-10-CM | POA: Diagnosis not present

## 2022-09-13 DIAGNOSIS — Z87442 Personal history of urinary calculi: Secondary | ICD-10-CM | POA: Diagnosis not present

## 2022-09-13 DIAGNOSIS — Z7982 Long term (current) use of aspirin: Secondary | ICD-10-CM | POA: Diagnosis not present

## 2022-09-13 DIAGNOSIS — Z86010 Personal history of colonic polyps: Secondary | ICD-10-CM | POA: Diagnosis not present

## 2022-09-13 DIAGNOSIS — D649 Anemia, unspecified: Secondary | ICD-10-CM

## 2022-09-13 LAB — CMP (CANCER CENTER ONLY)
ALT: 19 U/L (ref 0–44)
AST: 10 U/L — ABNORMAL LOW (ref 15–41)
Albumin: 4.1 g/dL (ref 3.5–5.0)
Alkaline Phosphatase: 65 U/L (ref 38–126)
Anion gap: 8 (ref 5–15)
BUN: 23 mg/dL (ref 8–23)
CO2: 25 mmol/L (ref 22–32)
Calcium: 9.1 mg/dL (ref 8.9–10.3)
Chloride: 102 mmol/L (ref 98–111)
Creatinine: 1.12 mg/dL (ref 0.61–1.24)
GFR, Estimated: >60 mL/min (ref >60)
Glucose, Bld: 241 mg/dL — ABNORMAL HIGH (ref 70–99)
Potassium: 4.4 mmol/L (ref 3.5–5.1)
Sodium: 135 mmol/L (ref 135–145)
Total Bilirubin: 0.8 mg/dL (ref 0.3–1.2)
Total Protein: 6 g/dL — ABNORMAL LOW (ref 6.5–8.1)

## 2022-09-13 LAB — CBC WITH DIFFERENTIAL (CANCER CENTER ONLY)
Abs Immature Granulocytes: 0.4 10*3/uL — ABNORMAL HIGH (ref 0.00–0.07)
Band Neutrophils: 7 %
Basophils Absolute: 0.4 10*3/uL — ABNORMAL HIGH (ref 0.0–0.1)
Basophils Relative: 2 %
Eosinophils Absolute: 0.6 10*3/uL — ABNORMAL HIGH (ref 0.0–0.5)
Eosinophils Relative: 3 %
HCT: 18.6 % — ABNORMAL LOW (ref 39.0–52.0)
Hemoglobin: 6.2 g/dL — CL (ref 13.0–17.0)
Lymphocytes Relative: 5 %
Lymphs Abs: 0.9 10*3/uL (ref 0.7–4.0)
MCH: 28.7 pg (ref 26.0–34.0)
MCHC: 33.3 g/dL (ref 30.0–36.0)
MCV: 86.1 fL (ref 80.0–100.0)
Metamyelocytes Relative: 1 %
Monocytes Absolute: 0.2 10*3/uL (ref 0.1–1.0)
Monocytes Relative: 1 %
Myelocytes: 1 %
Neutro Abs: 16 10*3/uL — ABNORMAL HIGH (ref 1.7–7.7)
Neutrophils Relative %: 80 %
Platelet Count: 469 10*3/uL — ABNORMAL HIGH (ref 150–400)
RBC: 2.16 MIL/uL — ABNORMAL LOW (ref 4.22–5.81)
RDW: 15 % (ref 11.5–15.5)
WBC Count: 18.4 10*3/uL — ABNORMAL HIGH (ref 4.0–10.5)
nRBC: 0 % (ref 0.0–0.2)

## 2022-09-13 LAB — PREPARE RBC (CROSSMATCH)

## 2022-09-13 LAB — SAMPLE TO BLOOD BANK

## 2022-09-13 MED ORDER — ONDANSETRON HCL 8 MG PO TABS
8.0000 mg | ORAL_TABLET | Freq: Once | ORAL | Status: AC
  Administered 2022-09-13: 8 mg via ORAL
  Filled 2022-09-13: qty 1

## 2022-09-13 MED ORDER — ACETAMINOPHEN 325 MG PO TABS
650.0000 mg | ORAL_TABLET | Freq: Once | ORAL | Status: AC
  Administered 2022-09-13: 650 mg via ORAL

## 2022-09-13 MED ORDER — SODIUM CHLORIDE 0.9% IV SOLUTION
250.0000 mL | Freq: Once | INTRAVENOUS | Status: AC
  Administered 2022-09-13: 250 mL via INTRAVENOUS

## 2022-09-13 MED ORDER — METHYLPREDNISOLONE SODIUM SUCC 40 MG IJ SOLR
40.0000 mg | Freq: Once | INTRAMUSCULAR | Status: AC
  Administered 2022-09-13: 40 mg via INTRAVENOUS
  Filled 2022-09-13: qty 1

## 2022-09-13 MED ORDER — ACETAMINOPHEN 325 MG PO TABS
ORAL_TABLET | ORAL | Status: AC
  Filled 2022-09-13: qty 2

## 2022-09-13 MED ORDER — SODIUM CHLORIDE 0.9 % IV SOLN
75.0000 mg/m2 | Freq: Once | INTRAVENOUS | Status: AC
  Administered 2022-09-13: 140 mg via INTRAVENOUS
  Filled 2022-09-13: qty 14

## 2022-09-13 MED ORDER — DIPHENHYDRAMINE HCL 25 MG PO CAPS: 25.0000 mg | ORAL_CAPSULE | Freq: Once | ORAL | Status: DC

## 2022-09-13 MED ORDER — SODIUM CHLORIDE 0.9 % IV SOLN: Freq: Once | INTRAVENOUS | Status: AC

## 2022-09-13 MED ORDER — ACETAMINOPHEN 325 MG PO TABS: 650.0000 mg | ORAL_TABLET | Freq: Once | ORAL | Status: DC

## 2022-09-13 NOTE — Progress Notes (Signed)
Per Dr Lorenso Courier and Shawn Stall RN consulted that patient should get 1 unit of blood.  Ordered.

## 2022-09-13 NOTE — H&P (Signed)
Referring Physician(s): Brunetta Genera  Supervising Physician: Michaelle Birks  Patient Status:  [facility]  Chief Complaint:  "I'm here for a port a cath"  Subjective: Pt known to IR service from bone marrow biopsies in 2022 and on 03/29/22. He has a PMH sig for HLD, GERD, renal stones, arthritis as well as JAK2 + MPN/MDS/refractory anemia/thrombocytosis. He is scheduled today for port a cath placement for MPN/MDS requiring frequent transfusion support /Vidaza chemotherapy.    Allergies: Patient has no known allergies.  Medications: Prior to Admission medications   Medication Sig Start Date End Date Taking? Authorizing Provider  ALFUZOSIN HCL ER PO  09/02/22   [provider]  aspirin EC 81 MG tablet Take 81 mg by mouth daily. Swallow whole.    [provider]  B Complex Vitamins (B COMPLEX PO) Take 1 tablet by mouth daily.    [provider]  Cholecalciferol (VITAMIN D3) 50 MCG (2000 UT) TABS Take 2,000 Units by mouth daily.    [provider]  docusate sodium (COLACE) 100 MG capsule Take 100 mg by mouth 2 (two) times daily.    [provider]  FIBER PO Take 1 capsule by mouth daily. Unknown strength    [provider]  fish oil-omega-3 fatty acids 1000 MG capsule Take 1 g by mouth 2 (two) times daily. Strength '300mg'$ /'1500mg'$     [provider]  Flaxseed, Linseed, (FLAXSEED OIL) 1000 MG CAPS Take 1,000 mg by mouth daily.    [provider]  GLUCOSAMINE CHONDROITIN MSM PO Take 1 tablet by mouth daily. Strength 1500/1500    [provider]  lidocaine-prilocaine (EMLA) cream Apply to affected area once 09/02/22   Brunetta Genera, MD  Misc Natural Products (PROSTATE SUPPORT PO) Take 1 capsule by mouth daily. Unknown strength    [provider]  Multiple Vitamin (MULTIVITAMIN) tablet Take 1 tablet by mouth daily. Unknown strength    [provider]  ondansetron (ZOFRAN) 8 MG tablet  Take 1 tablet (8 mg total) by mouth every 8 (eight) hours as needed for nausea or vomiting. 09/02/22   Brunetta Genera, MD  predniSONE (DELTASONE) 10 MG tablet 4 tabs daily for 4 days 3 tabs daily for 4 days 2 tabs daily for 4 days 1 tab daily for 4 days 09/01/22   Barb Merino, MD  prochlorperazine (COMPAZINE) 10 MG tablet Take 1 tablet (10 mg total) by mouth every 6 (six) hours as needed for nausea or vomiting. 09/02/22   Brunetta Genera, MD  psyllium (METAMUCIL) 58.6 % powder Take 1 packet by mouth daily.    [provider]  Turmeric (QC TUMERIC COMPLEX PO) Take 750 mg by mouth daily.    [provider]  vitamin C (ASCORBIC ACID) 250 MG tablet Take 500 mg by mouth daily.    [provider]     Vital Signs:   Physical Exam  Imaging: No results found.  Labs:  CBC: Recent Labs    08/31/22 1420 09/01/22 0330 09/06/22 0932 09/13/22 0750  WBC 16.3* 13.8* 26.2* 18.4*  HGB 5.7* 6.7* 7.3* 6.2*  HCT 17.8* 19.9* 21.4* 18.6*  PLT 540* 420* 642* 469*    COAGS: No results for input(s): "INR", "APTT" in the last 8760 hours.  BMP: Recent Labs    08/31/22 1420 09/01/22 0330 09/06/22 0938 09/13/22 0750  NA 137 135 135 135  K 4.4 4.4 4.7 4.4  CL 106 106 102 102  CO2 25 25 25  25  GLUCOSE 130* 136* 274* 241*  BUN 29* 23 27* 23  CALCIUM 8.5* 8.1* 8.8* 9.1  CREATININE 0.92 0.90 1.18 1.12  GFRNONAA >60 >60 >60 >60    LIVER FUNCTION TESTS: Recent Labs    08/23/22 0826 08/31/22 1420 09/06/22 0938 09/13/22 0750  BILITOT 0.7 1.8* 1.6* 0.8  AST 12* 24 14* 10*  ALT '14 13 19 19  '$ ALKPHOS 84 79 77 65  PROT 6.5 6.4* 6.1* 6.0*  ALBUMIN 4.1 3.8 4.0 4.1    Assessment and Plan: Pt known to IR service from bone marrow biopsies in 2022 and on 03/29/22. He has a PMH sig for HLD, GERD, renal stones, arthritis as well as JAK2 + MPN/MDS/refractory anemia/thrombocytosis. He is scheduled today for port a cath placement for MPN/MDS requiring frequent  transfusion support /Vidaza chemotherapy. Risks and benefits of image guided port-a-catheter placement was discussed with the patient including, but not limited to bleeding, infection, pneumothorax, or fibrin sheath development and need for additional procedures.  All of the patient's questions were answered, patient is agreeable to proceed. Consent signed and in chart.    Electronically Signed: D. Rowe Robert, PA-C 09/13/2022, 3:11 PM   I spent a total of  at the the patient's bedside AND on the patient's hospital floor or unit, greater than 50% of which was counseling/coordinating care for port a cath placement

## 2022-09-13 NOTE — Patient Instructions (Addendum)
[facility]  Discharge Instructions: Thank you for choosing [facility] to provide your oncology and hematology care.   If you have a lab appointment with the [facility], please go directly to the [facility] and check in at the registration area.   Wear comfortable clothing and clothing appropriate for easy access to any Portacath or PICC line.   We strive to give you quality time with your provider. You may need to reschedule your appointment if you arrive late (15 or more minutes).  Arriving late affects you and other patients whose appointments are after yours.  Also, if you miss three or more appointments without notifying the office, you may be dismissed from the clinic at the provider's discretion.      For prescription refill requests, have your pharmacy contact our office and allow 72 hours for refills to be completed.    Today you received the following chemotherapy and/or immunotherapy agents: Vidaza      To help prevent nausea and vomiting after your treatment, we encourage you to take your nausea medication as directed.  BELOW ARE SYMPTOMS THAT SHOULD BE REPORTED IMMEDIATELY: *FEVER GREATER THAN 100.4 F (38 C) OR HIGHER *CHILLS OR SWEATING *NAUSEA AND VOMITING THAT IS NOT CONTROLLED WITH YOUR NAUSEA MEDICATION *UNUSUAL SHORTNESS OF BREATH *UNUSUAL BRUISING OR BLEEDING *URINARY PROBLEMS (pain or burning when urinating, or frequent urination) *BOWEL PROBLEMS (unusual diarrhea, constipation, pain near the anus) TENDERNESS IN MOUTH AND THROAT WITH OR WITHOUT PRESENCE OF ULCERS (sore throat, sores in mouth, or a toothache) UNUSUAL RASH, SWELLING OR PAIN  UNUSUAL VAGINAL DISCHARGE OR ITCHING   Items with * indicate a potential emergency and should be followed up as soon as possible or go to the Emergency Department if any problems should occur.  Please show the CHEMOTHERAPY ALERT CARD or IMMUNOTHERAPY ALERT CARD at check-in to the  Emergency Department and triage nurse.  Should you have questions after your visit or need to cancel or reschedule your appointment, please contact [facility]  Dept: [phone]  and follow the prompts.  Office hours are 8:00 a.m. to 4:30 p.m. Monday - Friday. Please note that voicemails left after 4:00 p.m. may not be returned until the following business day.  We are closed weekends and major holidays. You have access to a nurse at all times for urgent questions. Please call the main number to the clinic Dept: [phone] and follow the prompts.   For any non-urgent questions, you may also contact your provider using MyChart. We now offer e-Visits for anyone 3 and older to request care online for non-urgent symptoms. For details visit [email or web address].   Also download the MyChart app! Go to the app store, search "MyChart", open the app, select [facility], and log in with your MyChart username and password.  Masks are optional in the cancer centers. If you would like for your care team to wear a mask while they are taking care of you, please let them know. You may have one support person who is at least 78 years old accompany you for your appointments. Blood Transfusion, Adult, Care After The following information offers guidance on how to care for yourself after your procedure. Your health care provider may also give you more specific instructions. If you have problems or questions, contact your health care provider. What can I expect after the procedure? After the procedure, it is common to have: Bruising and soreness where the IV  was inserted. A headache. Follow these instructions at home: IV insertion site care     Follow instructions from your health care provider about how to take care of your IV insertion site. Make sure you: Wash your hands with soap and water for at least 20 seconds before and after you change your bandage (dressing). If soap  and water are not available, use hand sanitizer. Change your dressing as told by your health care provider. Check your IV insertion site every day for signs of infection. Check for: Redness, swelling, or pain. Bleeding from the site. Warmth. Pus or a bad smell. General instructions Take over-the-counter and prescription medicines only as told by your health care provider. Rest as told by your health care provider. Return to your normal activities as told by your health care provider. Keep all follow-up visits. Lab tests may need to be done at certain periods to recheck your blood counts. Contact a health care provider if: You have itching or red, swollen areas of skin (hives). You have a fever or chills. You have pain in the head, back, or chest. You feel anxious or you feel weak after doing your normal activities. You have redness, swelling, warmth, or pain around the IV insertion site. You have blood coming from the IV insertion site that does not stop with pressure. You have pus or a bad smell coming from your IV insertion site. If you received your blood transfusion in an outpatient setting, you will be told whom to contact to report any reactions. Get help right away if: You have symptoms of a serious allergic or immune system reaction, including: Trouble breathing or shortness of breath. Swelling of the face, feeling flushed, or widespread rash. Dark urine or blood in the urine. Fast heartbeat. These symptoms may be an emergency. Get help right away. Call 911. Do not wait to see if the symptoms will go away. Do not drive yourself to the hospital. Summary Bruising and soreness around the IV insertion site are common. Check your IV insertion site every day for signs of infection. Rest as told by your health care provider. Return to your normal activities as told by your health care provider. Get help right away for symptoms of a serious allergic or immune system reaction to the  blood transfusion. This information is not intended to replace advice given to you by your health care provider. Make sure you discuss any questions you have with your health care provider. Document Revised: 01/01/2022 Document Reviewed: 01/01/2022 Elsevier Patient Education  [place].

## 2022-09-13 NOTE — Telephone Encounter (Signed)
CRITICAL VALUE STICKER  CRITICAL VALUE: Hgb 6.2  RECEIVER (on-site recipient of call): Julianne Rice, RN  DATE & TIME NOTIFIED: 09/13/2022 @ 8:28 am  MESSENGER (representative from lab): Pam  MD NOTIFIED: Dr Irene Limbo  TIME OF NOTIFICATION: 8.:30 am  RESPONSE: Pending.  Patient has appt today in infusion.  Blood Bank hold on file

## 2022-09-14 ENCOUNTER — Encounter (HOSPITAL_COMMUNITY): Payer: Self-pay

## 2022-09-14 ENCOUNTER — Ambulatory Visit (HOSPITAL_COMMUNITY)
Admission: RE | Admit: 2022-09-14 | Discharge: 2022-09-14 | Disposition: A | Discharge status: Home / Self Care | Payer: Medicare Other | Source: Ambulatory Visit | Attending: Hematology | Admitting: Hematology

## 2022-09-14 ENCOUNTER — Ambulatory Visit (HOSPITAL_COMMUNITY)
Admission: RE | Admit: 2022-09-14 | Discharge: 2022-09-14 | Disposition: A | Discharge status: Home / Self Care | Payer: Medicare Other | Source: Ambulatory Visit

## 2022-09-14 DIAGNOSIS — D469 Myelodysplastic syndrome, unspecified: Secondary | ICD-10-CM | POA: Diagnosis not present

## 2022-09-14 DIAGNOSIS — K219 Gastro-esophageal reflux disease without esophagitis: Secondary | ICD-10-CM | POA: Diagnosis not present

## 2022-09-14 DIAGNOSIS — E785 Hyperlipidemia, unspecified: Secondary | ICD-10-CM | POA: Diagnosis not present

## 2022-09-14 DIAGNOSIS — Z452 Encounter for adjustment and management of vascular access device: Secondary | ICD-10-CM | POA: Diagnosis not present

## 2022-09-14 HISTORY — DX: Splenomegaly, not elsewhere classified: R16.1

## 2022-09-14 HISTORY — PX: IR IMAGING GUIDED PORT INSERTION: IMG5740

## 2022-09-14 LAB — TYPE AND SCREEN
ABO/RH(D): A POS
Antibody Screen: NEGATIVE
Unit division: 0

## 2022-09-14 LAB — BPAM RBC
Blood Product Expiration Date: 202312182359
ISSUE DATE / TIME: 202311271104
Unit Type and Rh: 6200

## 2022-09-14 MED ORDER — MIDAZOLAM HCL 2 MG/2ML IJ SOLN
INTRAMUSCULAR | Status: AC | PRN
  Administered 2022-09-14 (×2): 1 mg via INTRAVENOUS

## 2022-09-14 MED ORDER — HEPARIN SOD (PORK) LOCK FLUSH 100 UNIT/ML IV SOLN
INTRAVENOUS | Status: AC
  Filled 2022-09-14: qty 5

## 2022-09-14 MED ORDER — LIDOCAINE-EPINEPHRINE 1 %-1:100000 IJ SOLN
INTRAMUSCULAR | Status: AC
  Filled 2022-09-14: qty 1

## 2022-09-14 MED ORDER — SODIUM CHLORIDE 0.9 % IV SOLN: INTRAVENOUS | Status: DC

## 2022-09-14 MED ORDER — MIDAZOLAM HCL 2 MG/2ML IJ SOLN
INTRAMUSCULAR | Status: AC
  Filled 2022-09-14: qty 4

## 2022-09-14 MED ORDER — FENTANYL CITRATE (PF) 100 MCG/2ML IJ SOLN
INTRAMUSCULAR | Status: AC | PRN
  Administered 2022-09-14: 50 ug via INTRAVENOUS

## 2022-09-14 MED ORDER — FENTANYL CITRATE (PF) 100 MCG/2ML IJ SOLN
INTRAMUSCULAR | Status: AC
  Filled 2022-09-14: qty 2

## 2022-09-14 MED ORDER — HEPARIN SOD (PORK) LOCK FLUSH 100 UNIT/ML IV SOLN
INTRAVENOUS | Status: AC | PRN
  Administered 2022-09-14: 500 [IU] via INTRAVENOUS

## 2022-09-14 NOTE — Procedures (Signed)
Vascular and Interventional Radiology Procedure Note  Patient: Fernando Lee DOB: 1945-11-22 Medical Record Number: [id number] Note Date/Time: 09/14/22 1:22 PM   Performing Physician: Michaelle Birks, MD Assistant(s): None  Diagnosis:  MDS  Procedure: PORT PLACEMENT  Anesthesia: Conscious Sedation Complications: None Estimated Blood Loss: Minimal  Findings:  Successful right-sided port placement, with the tip of the catheter in the proximal right atrium.  Plan: Catheter ready for use.  See detailed procedure note with images in PACS. The patient tolerated the procedure well without incident or complication and was returned to Recovery in stable condition.    Michaelle Birks, MD [facility] Rad[facility]   Pager. [place]

## 2022-09-16 ENCOUNTER — Other Ambulatory Visit: Payer: Self-pay

## 2022-09-16 ENCOUNTER — Inpatient Hospital Stay: Payer: Medicare Other

## 2022-09-16 DIAGNOSIS — R161 Splenomegaly, not elsewhere classified: Secondary | ICD-10-CM | POA: Diagnosis not present

## 2022-09-16 DIAGNOSIS — K219 Gastro-esophageal reflux disease without esophagitis: Secondary | ICD-10-CM | POA: Diagnosis not present

## 2022-09-16 DIAGNOSIS — Z79899 Other long term (current) drug therapy: Secondary | ICD-10-CM | POA: Diagnosis not present

## 2022-09-16 DIAGNOSIS — Z7952 Long term (current) use of systemic steroids: Secondary | ICD-10-CM | POA: Diagnosis not present

## 2022-09-16 DIAGNOSIS — Z87891 Personal history of nicotine dependence: Secondary | ICD-10-CM | POA: Diagnosis not present

## 2022-09-16 DIAGNOSIS — D469 Myelodysplastic syndrome, unspecified: Secondary | ICD-10-CM | POA: Diagnosis not present

## 2022-09-16 DIAGNOSIS — D649 Anemia, unspecified: Secondary | ICD-10-CM

## 2022-09-16 DIAGNOSIS — Z7982 Long term (current) use of aspirin: Secondary | ICD-10-CM | POA: Diagnosis not present

## 2022-09-16 DIAGNOSIS — R5383 Other fatigue: Secondary | ICD-10-CM | POA: Diagnosis not present

## 2022-09-16 DIAGNOSIS — E785 Hyperlipidemia, unspecified: Secondary | ICD-10-CM | POA: Diagnosis not present

## 2022-09-16 DIAGNOSIS — N202 Calculus of kidney with calculus of ureter: Secondary | ICD-10-CM | POA: Diagnosis not present

## 2022-09-16 DIAGNOSIS — Z87442 Personal history of urinary calculi: Secondary | ICD-10-CM | POA: Diagnosis not present

## 2022-09-16 DIAGNOSIS — Z86010 Personal history of colonic polyps: Secondary | ICD-10-CM | POA: Diagnosis not present

## 2022-09-16 DIAGNOSIS — K449 Diaphragmatic hernia without obstruction or gangrene: Secondary | ICD-10-CM | POA: Diagnosis not present

## 2022-09-16 LAB — CBC WITH DIFFERENTIAL (CANCER CENTER ONLY)
Abs Immature Granulocytes: 0.89 10*3/uL — ABNORMAL HIGH (ref 0.00–0.07)
Basophils Absolute: 0.2 10*3/uL — ABNORMAL HIGH (ref 0.0–0.1)
Basophils Relative: 2 %
Eosinophils Absolute: 0.5 10*3/uL (ref 0.0–0.5)
Eosinophils Relative: 3 %
HCT: 19.3 % — ABNORMAL LOW (ref 39.0–52.0)
Hemoglobin: 6.3 g/dL — CL (ref 13.0–17.0)
Immature Granulocytes: 7 %
Lymphocytes Relative: 3 %
Lymphs Abs: 0.5 10*3/uL — ABNORMAL LOW (ref 0.7–4.0)
MCH: 28.3 pg (ref 26.0–34.0)
MCHC: 32.6 g/dL (ref 30.0–36.0)
MCV: 86.5 fL (ref 80.0–100.0)
Monocytes Absolute: 0.6 10*3/uL (ref 0.1–1.0)
Monocytes Relative: 4 %
Neutro Abs: 11.1 10*3/uL — ABNORMAL HIGH (ref 1.7–7.7)
Neutrophils Relative %: 81 %
Platelet Count: 265 10*3/uL (ref 150–400)
RBC: 2.23 MIL/uL — ABNORMAL LOW (ref 4.22–5.81)
RDW: 15 % (ref 11.5–15.5)
WBC Count: 13.7 10*3/uL — ABNORMAL HIGH (ref 4.0–10.5)
nRBC: 0 % (ref 0.0–0.2)

## 2022-09-16 LAB — SAMPLE TO BLOOD BANK

## 2022-09-16 LAB — PREPARE RBC (CROSSMATCH)

## 2022-09-16 MED ORDER — SODIUM CHLORIDE 0.9% IV SOLUTION
250.0000 mL | Freq: Once | INTRAVENOUS | Status: AC
  Administered 2022-09-16: 250 mL via INTRAVENOUS

## 2022-09-16 MED ORDER — METHYLPREDNISOLONE SODIUM SUCC 40 MG IJ SOLR
40.0000 mg | Freq: Once | INTRAMUSCULAR | Status: AC
  Administered 2022-09-16: 40 mg via INTRAVENOUS
  Filled 2022-09-16: qty 1

## 2022-09-16 MED ORDER — ACETAMINOPHEN 325 MG PO TABS
650.0000 mg | ORAL_TABLET | Freq: Once | ORAL | Status: AC
  Administered 2022-09-16: 650 mg via ORAL
  Filled 2022-09-16: qty 2

## 2022-09-16 MED ORDER — SODIUM CHLORIDE 0.9% FLUSH
10.0000 mL | INTRAVENOUS | Status: AC | PRN
  Administered 2022-09-16: 10 mL

## 2022-09-16 MED ORDER — HEPARIN SOD (PORK) LOCK FLUSH 100 UNIT/ML IV SOLN
500.0000 [IU] | Freq: Every day | INTRAVENOUS | Status: AC | PRN
  Administered 2022-09-16: 500 [IU]

## 2022-09-16 NOTE — Patient Instructions (Signed)
Blood Transfusion, Adult, Care After The following information offers guidance on how to care for yourself after your procedure. Your health care provider may also give you more specific instructions. If you have problems or questions, contact your health care provider. What can I expect after the procedure? After the procedure, it is common to have: Bruising and soreness where the IV was inserted. A headache. Follow these instructions at home: IV insertion site care     Follow instructions from your health care provider about how to take care of your IV insertion site. Make sure you: Wash your hands with soap and water for at least 20 seconds before and after you change your bandage (dressing). If soap and water are not available, use hand sanitizer. Change your dressing as told by your health care provider. Check your IV insertion site every day for signs of infection. Check for: Redness, swelling, or pain. Bleeding from the site. Warmth. Pus or a bad smell. General instructions Take over-the-counter and prescription medicines only as told by your health care provider. Rest as told by your health care provider. Return to your normal activities as told by your health care provider. Keep all follow-up visits. Lab tests may need to be done at certain periods to recheck your blood counts. Contact a health care provider if: You have itching or red, swollen areas of skin (hives). You have a fever or chills. You have pain in the head, back, or chest. You feel anxious or you feel weak after doing your normal activities. You have redness, swelling, warmth, or pain around the IV insertion site. You have blood coming from the IV insertion site that does not stop with pressure. You have pus or a bad smell coming from your IV insertion site. If you received your blood transfusion in an outpatient setting, you will be told whom to contact to report any reactions. Get help right away if: You  have symptoms of a serious allergic or immune system reaction, including: Trouble breathing or shortness of breath. Swelling of the face, feeling flushed, or widespread rash. Dark urine or blood in the urine. Fast heartbeat. These symptoms may be an emergency. Get help right away. Call 911. Do not wait to see if the symptoms will go away. Do not drive yourself to the hospital. Summary Bruising and soreness around the IV insertion site are common. Check your IV insertion site every day for signs of infection. Rest as told by your health care provider. Return to your normal activities as told by your health care provider. Get help right away for symptoms of a serious allergic or immune system reaction to the blood transfusion. This information is not intended to replace advice given to you by your health care provider. Make sure you discuss any questions you have with your health care provider. Document Revised: 01/01/2022 Document Reviewed: 01/01/2022 Elsevier Patient Education  [facility]  Discharge Instructions: Thank you for choosing [facility] to provide your oncology and hematology care.   If you have a lab appointment with the [facility], please go directly to the [facility] and check in at the registration area.   Wear comfortable clothing and clothing appropriate for easy access to any Portacath or PICC line.   We strive to give you quality time with your provider. You may need to reschedule your appointment if you arrive late (15 or more minutes).  Arriving late affects you and other patients  whose appointments are after yours.  Also, if you miss three or more appointments without notifying the office, you may be dismissed from the clinic at the provider's discretion.      For prescription refill requests, have your pharmacy contact our office and allow 72 hours for refills to be completed.    Today you  received the following chemotherapy and/or immunotherapy agent: Azacitidine (Vidaza).   To help prevent nausea and vomiting after your treatment, we encourage you to take your nausea medication as directed.  BELOW ARE SYMPTOMS THAT SHOULD BE REPORTED IMMEDIATELY: *FEVER GREATER THAN 100.4 F (38 C) OR HIGHER *CHILLS OR SWEATING *NAUSEA AND VOMITING THAT IS NOT CONTROLLED WITH YOUR NAUSEA MEDICATION *UNUSUAL SHORTNESS OF BREATH *UNUSUAL BRUISING OR BLEEDING *URINARY PROBLEMS (pain or burning when urinating, or frequent urination) *BOWEL PROBLEMS (unusual diarrhea, constipation, pain near the anus) TENDERNESS IN MOUTH AND THROAT WITH OR WITHOUT PRESENCE OF ULCERS (sore throat, sores in mouth, or a toothache) UNUSUAL RASH, SWELLING OR PAIN  UNUSUAL VAGINAL DISCHARGE OR ITCHING   Items with * indicate a potential emergency and should be followed up as soon as possible or go to the Emergency Department if any problems should occur.  Please show the CHEMOTHERAPY ALERT CARD or IMMUNOTHERAPY ALERT CARD at check-in to the Emergency Department and triage nurse.  Should you have questions after your visit or need to cancel or reschedule your appointment, please contact [facility]  Dept: [phone]  and follow the prompts.  Office hours are 8:00 a.m. to 4:30 p.m. Monday - Friday. Please note that voicemails left after 4:00 p.m. may not be returned until the following business day.  We are closed weekends and major holidays. You have access to a nurse at all times for urgent questions. Please call the main number to the clinic Dept: [phone] and follow the prompts.   For any non-urgent questions, you may also contact your provider using MyChart. We now offer e-Visits for anyone 67 and older to request care online for non-urgent symptoms. For details visit [email or web address].   Also download the MyChart app! Go to the app store, search "MyChart", open the app,  select [facility], and log in with your MyChart username and password.  Masks are optional in the cancer centers. If you would like for your care team to wear a mask while they are taking care of you, please let them know. You may have one support person who is at least 78 years old accompany you for your appointments.  Azacitidine Injection What is this medication? AZACITIDINE (ay [place]) treats blood and bone marrow cancers. It works by slowing down the growth of cancer cells. This medicine may be used for other purposes; ask your health care provider or pharmacist if you have questions. COMMON BRAND NAME(S): Vidaza What should I tell my care team before I take this medication? They need to know if you have any of these conditions: Kidney disease Liver disease Low blood cell levels, such as low white cells, platelets, or red blood cells Low levels of albumin in the blood Low levels of bicarbonate in the blood An unusual or allergic reaction to azacitidine, mannitol, other medications, foods, dyes, or preservatives If you or your partner are pregnant or trying to get pregnant Breast-feeding How should I use this medication? This medication is injected into a vein or under the skin. It is given by your care team in a hospital or clinic setting. Talk  to your care team about the use of this medication in children. While it may be prescribed for children as young as 1 month for selected conditions, precautions do apply. Overdosage: If you think you have taken too much of this medicine contact a poison control center or emergency room at once. NOTE: This medicine is only for you. Do not share this medicine with others. What if I miss a dose? Keep appointments for follow-up doses. It is important not to miss your dose. Call your care team if you are unable to keep an appointment. What may interact with this medication? Interactions are not expected. This list may not describe all  possible interactions. Give your health care provider a list of all the medicines, herbs, non-prescription drugs, or dietary supplements you use. Also tell them if you smoke, drink alcohol, or use illegal drugs. Some items may interact with your medicine. What should I watch for while using this medication? Your condition will be monitored carefully while you are receiving this medication. You may need blood work while taking this medication. This medication may make you feel generally unwell. This is not uncommon as chemotherapy can affect healthy cells as well as cancer cells. Report any side effects. Continue your course of treatment even though you feel ill unless your care team tells you to stop. Other product types may be available that contain the medication azacitidine. The injection and oral products should not be used in place of one another. Talk to your care team if you have questions. This medication can cause serious side effects. To reduce the risk, your care team may give you other medications to take before receiving this one. Be sure to follow the directions from your care team. This medication may increase your risk of getting an infection. Call your care team for advice if you get a fever, chills, sore throat, or other symptoms of a cold or flu. Do not treat yourself. Try to avoid being around people who are sick. Avoid taking medications that contain aspirin, acetaminophen, ibuprofen, naproxen, or ketoprofen unless instructed by your care team. These medications may hide a fever. This medication may increase your risk to bruise or bleed. Call your care team if you notice any unusual bleeding. Be careful brushing or flossing your teeth or using a toothpick because you may get an infection or bleed more easily. If you have any dental work done, tell your dentist you are receiving this medication. Talk to your care team if you or your partner may be pregnant. Serious birth defects can  occur if you take this medication during pregnancy and for 6 months after the last dose. You will need a negative pregnancy test before starting this medication. Contraception is recommended while taking his medication and for 6 months after the last dose. Your care team can help you find the option that works for you. If your partner can get pregnant, use a condom during sex while taking this medication and for 3 months after the last dose. Do not breastfeed while taking this medication and for 1 week after the last dose. This medication may cause infertility. Talk to your care team if you are concerned about your fertility. What side effects may I notice from receiving this medication? Side effects that you should report to your care team as soon as possible: Allergic reactions--skin rash, itching, hives, swelling of the face, lips, tongue, or throat Infection--fever, chills, cough, sore throat, wounds that don't heal, pain or trouble when  passing urine, general feeling of discomfort or being unwell Kidney injury--decrease in the amount of urine, swelling of the ankles, hands, or feet Liver injury--right upper belly pain, loss of appetite, nausea, light-colored stool, dark yellow or brown urine, yellowing skin or eyes, unusual weakness or fatigue Low red blood cell level--unusual weakness or fatigue, dizziness, headache, trouble breathing Tumor lysis syndrome (TLS)--nausea, vomiting, diarrhea, decrease in the amount of urine, dark urine, unusual weakness or fatigue, confusion, muscle pain or cramps, fast or irregular heartbeat, joint pain Unusual bruising or bleeding Side effects that usually do not require medical attention (report to your care team if they continue or are bothersome): Constipation Diarrhea Nausea Pain, redness, or irritation at injection site Vomiting This list may not describe all possible side effects. Call your doctor for medical advice about side effects. You may report  side effects to FDA at 1-800-FDA-1088. Where should I keep my medication? This medication is given in a hospital or clinic. It will not be stored at home. NOTE: This sheet is a summary. It may not cover all possible information. If you have questions about this medicine, talk to your doctor, pharmacist, or health care provider.  2023 Elsevier/Gold Standard (2022-02-18 00:00:00)

## 2022-09-17 LAB — BPAM RBC
Blood Product Expiration Date: 202312232359
Blood Product Expiration Date: 202312232359
ISSUE DATE / TIME: 202311301047
ISSUE DATE / TIME: 202311301047
Unit Type and Rh: 6200
Unit Type and Rh: 6200

## 2022-09-17 LAB — TYPE AND SCREEN
ABO/RH(D): A POS
Antibody Screen: NEGATIVE
Unit division: 0
Unit division: 0

## 2022-09-20 ENCOUNTER — Other Ambulatory Visit: Payer: Self-pay

## 2022-09-20 DIAGNOSIS — D469 Myelodysplastic syndrome, unspecified: Secondary | ICD-10-CM

## 2022-09-20 MED ORDER — PREDNISONE 10 MG PO TABS: ORAL_TABLET | ORAL | 0 refills | Status: DC

## 2022-09-22 ENCOUNTER — Other Ambulatory Visit: Payer: Self-pay

## 2022-09-22 DIAGNOSIS — D469 Myelodysplastic syndrome, unspecified: Secondary | ICD-10-CM

## 2022-09-23 ENCOUNTER — Inpatient Hospital Stay: Payer: Medicare Other | Attending: Hematology

## 2022-09-23 ENCOUNTER — Other Ambulatory Visit: Payer: Self-pay

## 2022-09-23 ENCOUNTER — Inpatient Hospital Stay: Payer: Medicare Other

## 2022-09-23 DIAGNOSIS — J90 Pleural effusion, not elsewhere classified: Secondary | ICD-10-CM | POA: Insufficient documentation

## 2022-09-23 DIAGNOSIS — D469 Myelodysplastic syndrome, unspecified: Secondary | ICD-10-CM

## 2022-09-23 DIAGNOSIS — D75839 Thrombocytosis, unspecified: Secondary | ICD-10-CM | POA: Diagnosis not present

## 2022-09-23 DIAGNOSIS — N400 Enlarged prostate without lower urinary tract symptoms: Secondary | ICD-10-CM | POA: Diagnosis not present

## 2022-09-23 DIAGNOSIS — Z79899 Other long term (current) drug therapy: Secondary | ICD-10-CM | POA: Insufficient documentation

## 2022-09-23 DIAGNOSIS — M1711 Unilateral primary osteoarthritis, right knee: Secondary | ICD-10-CM | POA: Diagnosis not present

## 2022-09-23 DIAGNOSIS — E782 Mixed hyperlipidemia: Secondary | ICD-10-CM | POA: Insufficient documentation

## 2022-09-23 DIAGNOSIS — R739 Hyperglycemia, unspecified: Secondary | ICD-10-CM | POA: Diagnosis not present

## 2022-09-23 DIAGNOSIS — Z87891 Personal history of nicotine dependence: Secondary | ICD-10-CM | POA: Insufficient documentation

## 2022-09-23 DIAGNOSIS — Z5111 Encounter for antineoplastic chemotherapy: Secondary | ICD-10-CM | POA: Insufficient documentation

## 2022-09-23 DIAGNOSIS — K5900 Constipation, unspecified: Secondary | ICD-10-CM | POA: Diagnosis not present

## 2022-09-23 DIAGNOSIS — D72829 Elevated white blood cell count, unspecified: Secondary | ICD-10-CM | POA: Diagnosis not present

## 2022-09-23 DIAGNOSIS — N202 Calculus of kidney with calculus of ureter: Secondary | ICD-10-CM | POA: Insufficient documentation

## 2022-09-23 DIAGNOSIS — R161 Splenomegaly, not elsewhere classified: Secondary | ICD-10-CM | POA: Diagnosis not present

## 2022-09-23 DIAGNOSIS — Z7952 Long term (current) use of systemic steroids: Secondary | ICD-10-CM | POA: Diagnosis not present

## 2022-09-23 DIAGNOSIS — K219 Gastro-esophageal reflux disease without esophagitis: Secondary | ICD-10-CM | POA: Insufficient documentation

## 2022-09-23 DIAGNOSIS — K449 Diaphragmatic hernia without obstruction or gangrene: Secondary | ICD-10-CM | POA: Insufficient documentation

## 2022-09-23 LAB — SAMPLE TO BLOOD BANK

## 2022-09-23 LAB — CBC WITH DIFFERENTIAL (CANCER CENTER ONLY)
Abs Immature Granulocytes: 0.87 10*3/uL — ABNORMAL HIGH (ref 0.00–0.07)
Basophils Absolute: 0.2 10*3/uL — ABNORMAL HIGH (ref 0.0–0.1)
Basophils Relative: 2 %
Eosinophils Absolute: 0.5 10*3/uL (ref 0.0–0.5)
Eosinophils Relative: 4 %
HCT: 21.8 % — ABNORMAL LOW (ref 39.0–52.0)
Hemoglobin: 7.2 g/dL — ABNORMAL LOW (ref 13.0–17.0)
Immature Granulocytes: 8 %
Lymphocytes Relative: 5 %
Lymphs Abs: 0.6 10*3/uL — ABNORMAL LOW (ref 0.7–4.0)
MCH: 29.6 pg (ref 26.0–34.0)
MCHC: 33 g/dL (ref 30.0–36.0)
MCV: 89.7 fL (ref 80.0–100.0)
Monocytes Absolute: 0.7 10*3/uL (ref 0.1–1.0)
Monocytes Relative: 6 %
Neutro Abs: 8.4 10*3/uL — ABNORMAL HIGH (ref 1.7–7.7)
Neutrophils Relative %: 75 %
Platelet Count: 388 10*3/uL (ref 150–400)
RBC: 2.43 MIL/uL — ABNORMAL LOW (ref 4.22–5.81)
RDW: 16.4 % — ABNORMAL HIGH (ref 11.5–15.5)
WBC Count: 11.2 10*3/uL — ABNORMAL HIGH (ref 4.0–10.5)
nRBC: 0 % (ref 0.0–0.2)

## 2022-09-23 LAB — CMP (CANCER CENTER ONLY)
ALT: 10 U/L (ref 0–44)
AST: 10 U/L — ABNORMAL LOW (ref 15–41)
Albumin: 3.6 g/dL (ref 3.5–5.0)
Alkaline Phosphatase: 60 U/L (ref 38–126)
Anion gap: 9 (ref 5–15)
BUN: 17 mg/dL (ref 8–23)
CO2: 23 mmol/L (ref 22–32)
Calcium: 8.6 mg/dL — ABNORMAL LOW (ref 8.9–10.3)
Chloride: 102 mmol/L (ref 98–111)
Creatinine: 0.93 mg/dL (ref 0.61–1.24)
GFR, Estimated: >60 mL/min (ref >60)
Glucose, Bld: 249 mg/dL — ABNORMAL HIGH (ref 70–99)
Potassium: 4.2 mmol/L (ref 3.5–5.1)
Sodium: 134 mmol/L — ABNORMAL LOW (ref 135–145)
Total Bilirubin: 1.3 mg/dL — ABNORMAL HIGH (ref 0.3–1.2)
Total Protein: 5.6 g/dL — ABNORMAL LOW (ref 6.5–8.1)

## 2022-09-23 LAB — PREPARE RBC (CROSSMATCH)

## 2022-09-23 MED ORDER — METHYLPREDNISOLONE SODIUM SUCC 40 MG IJ SOLR
40.0000 mg | Freq: Once | INTRAMUSCULAR | Status: AC
  Administered 2022-09-23: 40 mg via INTRAVENOUS
  Filled 2022-09-23: qty 1

## 2022-09-23 MED ORDER — SODIUM CHLORIDE 0.9% IV SOLUTION
250.0000 mL | Freq: Once | INTRAVENOUS | Status: AC
  Administered 2022-09-23: 250 mL via INTRAVENOUS

## 2022-09-23 MED ORDER — SODIUM CHLORIDE 0.9% FLUSH
10.0000 mL | INTRAVENOUS | Status: AC
  Administered 2022-09-23: 10 mL via INTRAVENOUS

## 2022-09-23 MED ORDER — ACETAMINOPHEN 325 MG PO TABS
650.0000 mg | ORAL_TABLET | Freq: Once | ORAL | Status: AC
  Administered 2022-09-23: 650 mg via ORAL
  Filled 2022-09-23: qty 2

## 2022-09-24 ENCOUNTER — Inpatient Hospital Stay (HOSPITAL_BASED_OUTPATIENT_CLINIC_OR_DEPARTMENT_OTHER): Payer: Medicare Other | Admitting: Hematology

## 2022-09-24 VITALS — BP 122/62 | HR 95 | Temp 97.7°F | Resp 17 | Ht 67.0 in | Wt 166.6 lb

## 2022-09-24 DIAGNOSIS — D75839 Thrombocytosis, unspecified: Secondary | ICD-10-CM | POA: Diagnosis not present

## 2022-09-24 DIAGNOSIS — R161 Splenomegaly, not elsewhere classified: Secondary | ICD-10-CM | POA: Diagnosis not present

## 2022-09-24 DIAGNOSIS — D471 Chronic myeloproliferative disease: Secondary | ICD-10-CM

## 2022-09-24 DIAGNOSIS — E782 Mixed hyperlipidemia: Secondary | ICD-10-CM | POA: Diagnosis not present

## 2022-09-24 DIAGNOSIS — J90 Pleural effusion, not elsewhere classified: Secondary | ICD-10-CM | POA: Diagnosis not present

## 2022-09-24 DIAGNOSIS — D469 Myelodysplastic syndrome, unspecified: Secondary | ICD-10-CM

## 2022-09-24 DIAGNOSIS — Z7952 Long term (current) use of systemic steroids: Secondary | ICD-10-CM | POA: Diagnosis not present

## 2022-09-24 DIAGNOSIS — Z79899 Other long term (current) drug therapy: Secondary | ICD-10-CM | POA: Diagnosis not present

## 2022-09-24 DIAGNOSIS — D72829 Elevated white blood cell count, unspecified: Secondary | ICD-10-CM | POA: Diagnosis not present

## 2022-09-24 DIAGNOSIS — M1711 Unilateral primary osteoarthritis, right knee: Secondary | ICD-10-CM | POA: Diagnosis not present

## 2022-09-24 DIAGNOSIS — K449 Diaphragmatic hernia without obstruction or gangrene: Secondary | ICD-10-CM | POA: Diagnosis not present

## 2022-09-24 DIAGNOSIS — N202 Calculus of kidney with calculus of ureter: Secondary | ICD-10-CM | POA: Diagnosis not present

## 2022-09-24 DIAGNOSIS — K219 Gastro-esophageal reflux disease without esophagitis: Secondary | ICD-10-CM | POA: Diagnosis not present

## 2022-09-24 DIAGNOSIS — K5900 Constipation, unspecified: Secondary | ICD-10-CM | POA: Diagnosis not present

## 2022-09-24 DIAGNOSIS — D649 Anemia, unspecified: Secondary | ICD-10-CM

## 2022-09-24 DIAGNOSIS — Z87891 Personal history of nicotine dependence: Secondary | ICD-10-CM | POA: Diagnosis not present

## 2022-09-24 DIAGNOSIS — R739 Hyperglycemia, unspecified: Secondary | ICD-10-CM | POA: Diagnosis not present

## 2022-09-24 DIAGNOSIS — Z5111 Encounter for antineoplastic chemotherapy: Secondary | ICD-10-CM | POA: Diagnosis not present

## 2022-09-24 LAB — BPAM RBC
Blood Product Expiration Date: 202312292359
ISSUE DATE / TIME: 202312071050
Unit Type and Rh: 6200

## 2022-09-24 LAB — TYPE AND SCREEN
ABO/RH(D): A POS
Antibody Screen: NEGATIVE
Unit division: 0

## 2022-09-24 MED ORDER — SENNOSIDES-DOCUSATE SODIUM 8.6-50 MG PO TABS: 2.0000 | ORAL_TABLET | Freq: Every day | ORAL | 1 refills | Status: DC

## 2022-09-24 NOTE — Progress Notes (Signed)
HEMATOLOGY/ONCOLOGY PROGRESS NOTE:   Date of Service: 09/24/22  Patient Care Team: Lurline Del, DO as PCP - General (Family Medicine)  CHIEF COMPLAINTS:  Follow-up for continued evaluation and management of JAK2 positive myeloproliferative neoplasm/MDS  INTERVAL HISTORY:  [name] is a 76 y.o. male here for continued evaluation and management of his MPN/MDS with refractory anemia thrombocytosis. He is here for toxicity check after cycle 1 of Vidaza.  Patient was last seen by me on 09/06/22 and noted fatigue after his morning medications. He noted accompanying weight loss despite good appetite.   Today, he states that he tolerated his first cycle of Vidaza. However, he did experience nausea and fatigue the day after treatment. However, he has continued to experience an increase in his energy levels overall. He denies any vomiting or diarrhea. He is using Senna and Metamucil with mild relief of constipation. He denies any other significant toxicities.  I provided reassurance that a repeat bone marrow biopsy is not indicated at this time as he is responding well to the [place].  He reports that his LUQ discomfort has improved but he does have sudden episodes of pain with straining such as when sneezing and coughing. He has not been using Tramadol for his pain as it causes him to be very drowsy.  He reports that his appetite is good and he does not feel that he is getting full too soon. His weight has been stable for the last 3 months.  He denies any urinary complaints.  He has not been taking the steroids as he has concerned about permanent negative side effects. I provided reassurance that most of these effects are primarily associated with long-term steroid use rather than a short taper. We discussed the benefits of steroid use in regards to her splenomegaly.  We reviewed his labs results in detail. His WBC have improved significantly from 23.8 to 11.2.  HGB has improved  from 5.9 to 7.2 and his HCT has improved from 17.5 to 21.8. His platelets have elevated from 649K to 388K today. He has some persistent mild hyperglycemia but his CMP is otherwise stable.   MEDICAL HISTORY:  Past Medical History:  Diagnosis Date   Arthritis of right knee    Cervicalgia    Chest discomfort    normal stress echo   Chest pain    Colon polyps    Dyslipidemia    Fluttering sensation of heart    GERD without esophagitis    Hematochezia    Hyperglycemia    Hyperlipidemia    Internal hemorrhoids    Kidney stones    Male erectile dysfunction, unspecified    Mixed dyslipidemia    Mixed hyperlipidemia    Ocular migraine    PAC (premature atrial contraction)    Personal history of colonic polyps    Prediabetes    Residual hemorrhoidal skin tags    Spleen enlarged    nov 2023 hospitalized   Unspecified hemorrhoids     SURGICAL HISTORY: Past Surgical History:  Procedure Laterality Date   BONE MARROW BIOPSY     IR IMAGING GUIDED PORT INSERTION  09/14/2022   KIDNEY STONE SURGERY     retrieval    SOCIAL HISTORY: Social History   Socioeconomic History   Marital status: Married    Spouse name: Not on file   Number of children: Not on file   Years of education: Not on file   Highest education level: Not on file  Occupational History  Not on file  Tobacco Use   Smoking status: Former    Types: Cigarettes   Smokeless tobacco: Never  Substance and Sexual Activity   Alcohol use: Yes    Alcohol/week: 1.0 standard drink of alcohol    Types: 1 Cans of beer per week   Drug use: Not on file   Sexual activity: Not on file  Other Topics Concern   Not on file  Social History Narrative   Not on file   Social Determinants of Health   Financial Resource Strain: Not on file  Food Insecurity: Unknown (08/31/2022)   Hunger Vital Sign    Worried About Running Out of Food in the Last Year: Never true    Ran Out of Food in the Last Year: Not on file   Transportation Needs: No Transportation Needs (08/31/2022)   PRAPARE - Hydrologist (Medical): No    Lack of Transportation (Non-Medical): No  Physical Activity: Not on file  Stress: Not on file  Social Connections: Not on file  Intimate Partner Violence: Not on file    FAMILY HISTORY: Family History  Problem Relation Age of Onset   Stroke Brother    Congestive Heart Failure Brother     ALLERGIES:  has No Known Allergies.  MEDICATIONS:  Current Outpatient Medications  Medication Sig Dispense Refill   ALFUZOSIN HCL ER PO      aspirin EC 81 MG tablet Take 81 mg by mouth daily. Swallow whole.     B Complex Vitamins (B COMPLEX PO) Take 1 tablet by mouth daily.     Cholecalciferol (VITAMIN D3) 50 MCG (2000 UT) TABS Take 2,000 Units by mouth daily.     docusate sodium (COLACE) 100 MG capsule Take 100 mg by mouth 2 (two) times daily.     FIBER PO Take 1 capsule by mouth daily. Unknown strength     fish oil-omega-3 fatty acids 1000 MG capsule Take 1 g by mouth 2 (two) times daily. Strength 381m/1500mg     Flaxseed, Linseed, (FLAXSEED OIL) 1000 MG CAPS Take 1,000 mg by mouth daily.     GLUCOSAMINE CHONDROITIN MSM PO Take 1 tablet by mouth daily. Strength 1500/1500     lidocaine-prilocaine (EMLA) cream Apply to affected area once 30 g 3   Misc Natural Products (PROSTATE SUPPORT PO) Take 1 capsule by mouth daily. Unknown strength     Multiple Vitamin (MULTIVITAMIN) tablet Take 1 tablet by mouth daily. Unknown strength     ondansetron (ZOFRAN) 8 MG tablet Take 1 tablet (8 mg total) by mouth every 8 (eight) hours as needed for nausea or vomiting. 30 tablet 1   predniSONE (DELTASONE) 10 MG tablet 4 tabs daily for 4 days 3 tabs daily for 4 days 2 tabs daily for 4 days 1 tab daily for 4 days 40 tablet 0   prochlorperazine (COMPAZINE) 10 MG tablet Take 1 tablet (10 mg total) by mouth every 6 (six) hours as needed for nausea or vomiting. 30 tablet 1   psyllium  (METAMUCIL) 58.6 % powder Take 1 packet by mouth daily.     Turmeric (QC TUMERIC COMPLEX PO) Take 750 mg by mouth daily.     vitamin C (ASCORBIC ACID) 250 MG tablet Take 500 mg by mouth daily.     No current facility-administered medications for this visit.    REVIEW OF SYSTEMS:   10 Point review of Systems was done is negative except as noted above.   PHYSICAL EXAMINATION: .  BP 122/62 (BP Location: Left Arm, Patient Position: Sitting)   Pulse 95   Temp 97.7 F (36.5 C) (Temporal)   Resp 17   Ht _0  (1.702 m)   Wt 166 lb 9.6 oz (75.6 kg)   SpO2 96%   BMI 26.09 kg/m  NAD GENERAL:alert, in no acute distress and comfortable SKIN: no acute rashes, no significant lesions EYES: conjunctiva are pink and non-injected, sclera anicteric NECK: supple, no JVD LYMPH:  no palpable lymphadenopathy in the cervical, axillary or inguinal regions LUNGS: clear to auscultation b/l with normal respiratory effort HEART: regular rate & rhythm ABDOMEN:  normoactive bowel sounds , non tender, not distended. Splenomegaly *** Extremity: no pedal edema PSYCH: alert & oriented x 3 with fluent speech NEURO: no focal motor/sensory deficits  LABORATORY DATA:  I have reviewed the data as listed  .    Latest Ref Rng & Units 09/23/2022    8:45 AM 09/16/2022    8:43 AM 09/13/2022    7:50 AM  CBC  WBC 4.0 - 10.5 K/uL 11.2  13.7  18.4   Hemoglobin 13.0 - 17.0 g/dL 7.2  6.3  6.2   Hematocrit 39.0 - 52.0 % 21.8  19.3  18.6   Platelets 150 - 400 K/uL 388  265  469    Iron/TIBC/Ferritin/ %Sat    Component Value Date/Time   IRON 198 (H) 07/19/2022 1344   TIBC 210 (L) 07/19/2022 1344   FERRITIN 933 (H) 07/19/2022 1343   IRONPCTSAT 94 (H) 07/19/2022 1344  .    Latest Ref Rng & Units 09/23/2022    8:45 AM 09/13/2022    7:50 AM 09/06/2022    9:38 AM  CMP  Glucose 70 - 99 mg/dL 249  241  274   BUN 8 - 23 mg/dL _1 Creatinine 0.61 - 1.24 mg/dL 0.93  1.12  1.18   Sodium 135 - 145 mmol/L 134   135  135   Potassium 3.5 - 5.1 mmol/L 4.2  4.4  4.7   Chloride 98 - 111 mmol/L 102  102  102   CO2 22 - 32 mmol/L _2 Calcium 8.9 - 10.3 mg/dL 8.6  9.1  8.8   Total Protein 6.5 - 8.1 g/dL 5.6  6.0  6.1   Total Bilirubin 0.3 - 1.2 mg/dL 1.3  0.8  1.6   Alkaline Phos 38 - 126 U/L 60  65  77   AST 15 - 41 U/L _3 ALT 0 - 44 U/L _4 . Lab Results  Component Value Date   LDH 110 01/04/2022    02/23/2021 BCR ABL    02/23/2021 JAK2   Surgical Pathology  CASE: [id number]  PATIENT: Fernando Lee  Bone Marrow Report      Clinical History: MPN with progressive anemia  (BH)  DIAGNOSIS:   BONE MARROW, ASPIRATE, CLOT, CORE:  -Hypercellular bone marrow with features of myeloid neoplasm  -See comment   PERIPHERAL BLOOD:  -Macrocytic anemia  -Leukocytosis  -Thrombocytosis   COMMENT:   The bone marrow/peripheral blood show persistent involvement by  previously known myeloid neoplasm.  There is a myeloproliferative  component as supported by previous JAK2 positivity.  However, there are  also dyspoietic changes primarily involving the megakaryocytic cell line  with numerous hypolobated/unilobated forms in addition to  dysgranulopoiesis to a lesser extent.  This is associated with  eosinophilia.  It is not entirely clear whether the overall findings  represent a myeloproliferative neoplasm with treatment related changes  or represent a primary myeloproliferative/myelodysplastic neoplasm  including but not limited to myeloid neoplasms with eosinophilia.  Correlation with cytogenetic and FISH studies strongly recommended.      RADIOGRAPHIC STUDIES: I have personally reviewed the radiological images as listed and agreed with the findings in the report. IR IMAGING GUIDED PORT INSERTION  Result Date: 09/14/2022 INDICATION: Poor IV access.  Myelodysplastic syndrome EXAM: IMPLANTED PORT A CATH PLACEMENT WITH ULTRASOUND AND FLUOROSCOPIC GUIDANCE  MEDICATIONS: None ANESTHESIA/SEDATION: Moderate (conscious) sedation was employed during this procedure. A total of Versed 3 mg and Fentanyl 100 mcg was administered intravenously. Moderate Sedation Time: 20 minutes. The patient's level of consciousness and vital signs were monitored continuously by radiology nursing throughout the procedure under my direct supervision. FLUOROSCOPY TIME:  Fluoroscopic dose; 1 mGy COMPLICATIONS: None immediate. PROCEDURE: The procedure, risks, benefits, and alternatives were explained to the patient. Questions regarding the procedure were encouraged and answered. The patient understands and consents to the procedure. The RIGHT neck and chest were prepped with chlorhexidine in a sterile fashion, and a sterile drape was applied covering the operative field. Maximum barrier sterile technique with sterile gowns and gloves were used for the procedure. A timeout was performed prior to the initiation of the procedure. Local anesthesia was provided with 1% lidocaine with epinephrine. After creating a small venotomy incision, a micropuncture kit was utilized to access the internal jugular vein under direct, real-time ultrasound guidance. Ultrasound image documentation was performed. The microwire was kinked to measure appropriate catheter length. A subcutaneous port pocket was then created along the upper chest wall utilizing a combination of sharp and blunt dissection. The pocket was irrigated with sterile saline. A single lumen ISP power injectable port was chosen for placement. The 8 Fr catheter was tunneled from the port pocket site to the venotomy incision. The port was placed in the pocket. The external catheter was trimmed to appropriate length. At the venotomy, an 8 Fr peel-away sheath was placed over a guidewire under fluoroscopic guidance. The catheter was then placed through the sheath and the sheath was removed. Final catheter positioning was confirmed and documented with a  fluoroscopic spot radiograph. The port was accessed with a Huber needle, aspirated and flushed with heparinized saline. The port pocket incision was closed with interrupted 3-0 Vicryl suture then Dermabond was applied, including at the venotomy incision. Dressings were placed. The patient tolerated the procedure well without immediate post procedural complication. IMPRESSION: Successful placement of a RIGHT internal jugular approach power injectable Port-A-Cath. The tip of the catheter is positioned within the proximal RIGHT atrium. The catheter is ready for immediate use. Michaelle Birks, MD [facility] Radiology Electronically Signed   By: Michaelle Birks M.D.   On: 09/14/2022 17:18   CT ANGIO GI BLEED  Result Date: 08/31/2022 CLINICAL DATA:  Acute mesenteric ischemia. Abdominal pain. History of myelodysplastic syndrome. EXAM: CTA ABDOMEN AND PELVIS WITHOUT AND WITH CONTRAST TECHNIQUE: Multidetector CT imaging of the abdomen and pelvis was performed using the standard protocol during bolus administration of intravenous contrast. Multiplanar reconstructed images and MIPs were obtained and reviewed to evaluate the vascular anatomy. RADIATION DOSE REDUCTION: This exam was performed according to the departmental dose-optimization program which includes automated exposure control, adjustment of the mA and/or kV according to patient size and/or use of iterative reconstruction technique. CONTRAST:  171m OMNIPAQUE IOHEXOL 350 MG/ML SOLN  COMPARISON:  Abdominal ultrasound examination 04/03/2021 FINDINGS: VASCULAR Aorta: Normal caliber abdominal aorta. Scattered atherosclerotic calcifications. No dissection. Celiac: Normal SMA: Normal Renals: Normal IMA: Patent Inflow: Normal Proximal Outflow: Normal Veins: Normal Review of the MIP images confirms the above findings. NON-VASCULAR Lower chest: Small left pleural effusion and bibasilar atelectasis. The heart is normal in size. No  pericardial effusion. There is a moderate to large hiatal hernia. Hepatobiliary: No hepatic lesions or intrahepatic biliary dilatation. The gallbladder is unremarkable. No common bile duct dilatation. Pancreas: No mass, inflammation or ductal dilatation. Spleen: Massive splenomegaly. The spleen measures 20 x 16 x 11 cm. No splenic lesions or splenic infarct. Adrenals/Urinary Tract: The left kidney is displaced medially and inferiorly by the enlarged spleen. No worrisome renal lesions,, hydronephrosis or pyelonephritis. There is a lower pole right renal calculus and there are 2 distal left ureteral calculi more proximal calculus measures 4 mm and the more distal calculus measures 5 mm. I do not see any hydroureter or hydronephrosis. Stomach/Bowel: The stomach, duodenum, small bowel and colon are grossly normal. No inflammatory changes, mass lesions or obstructive findings. Lymphatic: No abdominal or pelvic lymphadenopathy. Reproductive: The prostate gland is enlarged. The seminal vesicles are unremarkable. Other: No pelvic mass or adenopathy. No free pelvic fluid collections. No inguinal mass or adenopathy. No abdominal wall hernia or subcutaneous lesions. Musculoskeletal: No significant bony findings. IMPRESSION: 1. Normal caliber abdominal aorta and no dissection. The branch vessels are normal. 2. Splenomegaly. 3. Two distal left ureteral calculi but no hydroureter or hydronephrosis. 4. Lower pole right renal calculus. 5. Moderate to large hiatal hernia. 6. Small left pleural effusion and bibasilar atelectasis. Electronically Signed   By: Marijo Sanes M.D.   On: 08/31/2022 18:13     IR IMAGING GUIDED PORT INSERTION  Result Date: 09/14/2022 INDICATION: Poor IV access.  Myelodysplastic syndrome EXAM: IMPLANTED PORT A CATH PLACEMENT WITH ULTRASOUND AND FLUOROSCOPIC GUIDANCE MEDICATIONS: None ANESTHESIA/SEDATION: Moderate (conscious) sedation was employed during this procedure. A total of Versed 3 mg and  Fentanyl 100 mcg was administered intravenously. Moderate Sedation Time: 20 minutes. The patient's level of consciousness and vital signs were monitored continuously by radiology nursing throughout the procedure under my direct supervision. FLUOROSCOPY TIME:  Fluoroscopic dose; 1 mGy COMPLICATIONS: None immediate. PROCEDURE: The procedure, risks, benefits, and alternatives were explained to the patient. Questions regarding the procedure were encouraged and answered. The patient understands and consents to the procedure. The RIGHT neck and chest were prepped with chlorhexidine in a sterile fashion, and a sterile drape was applied covering the operative field. Maximum barrier sterile technique with sterile gowns and gloves were used for the procedure. A timeout was performed prior to the initiation of the procedure. Local anesthesia was provided with 1% lidocaine with epinephrine. After creating a small venotomy incision, a micropuncture kit was utilized to access the internal jugular vein under direct, real-time ultrasound guidance. Ultrasound image documentation was performed. The microwire was kinked to measure appropriate catheter length. A subcutaneous port pocket was then created along the upper chest wall utilizing a combination of sharp and blunt dissection. The pocket was irrigated with sterile saline. A single lumen ISP power injectable port was chosen for placement. The 8 Fr catheter was tunneled from the port pocket site to the venotomy incision. The port was placed in the pocket. The external catheter was trimmed to appropriate length. At the venotomy, an 8 Fr peel-away sheath was placed over a guidewire under fluoroscopic guidance. The catheter was then placed through the  sheath and the sheath was removed. Final catheter positioning was confirmed and documented with a fluoroscopic spot radiograph. The port was accessed with a Huber needle, aspirated and flushed with heparinized saline. The port pocket  incision was closed with interrupted 3-0 Vicryl suture then Dermabond was applied, including at the venotomy incision. Dressings were placed. The patient tolerated the procedure well without immediate post procedural complication. IMPRESSION: Successful placement of a RIGHT internal jugular approach power injectable Port-A-Cath. The tip of the catheter is positioned within the proximal RIGHT atrium. The catheter is ready for immediate use. Michaelle Birks, MD [facility] Radiology Electronically Signed   By: Michaelle Birks M.D.   On: 09/14/2022 17:18   CT ANGIO GI BLEED  Result Date: 08/31/2022 CLINICAL DATA:  Acute mesenteric ischemia. Abdominal pain. History of myelodysplastic syndrome. EXAM: CTA ABDOMEN AND PELVIS WITHOUT AND WITH CONTRAST TECHNIQUE: Multidetector CT imaging of the abdomen and pelvis was performed using the standard protocol during bolus administration of intravenous contrast. Multiplanar reconstructed images and MIPs were obtained and reviewed to evaluate the vascular anatomy. RADIATION DOSE REDUCTION: This exam was performed according to the departmental dose-optimization program which includes automated exposure control, adjustment of the mA and/or kV according to patient size and/or use of iterative reconstruction technique. CONTRAST:  133m OMNIPAQUE IOHEXOL 350 MG/ML SOLN COMPARISON:  Abdominal ultrasound examination 04/03/2021 FINDINGS: VASCULAR Aorta: Normal caliber abdominal aorta. Scattered atherosclerotic calcifications. No dissection. Celiac: Normal SMA: Normal Renals: Normal IMA: Patent Inflow: Normal Proximal Outflow: Normal Veins: Normal Review of the MIP images confirms the above findings. NON-VASCULAR Lower chest: Small left pleural effusion and bibasilar atelectasis. The heart is normal in size. No pericardial effusion. There is a moderate to large hiatal hernia. Hepatobiliary: No hepatic lesions or intrahepatic biliary dilatation.  The gallbladder is unremarkable. No common bile duct dilatation. Pancreas: No mass, inflammation or ductal dilatation. Spleen: Massive splenomegaly. The spleen measures 20 x 16 x 11 cm. No splenic lesions or splenic infarct. Adrenals/Urinary Tract: The left kidney is displaced medially and inferiorly by the enlarged spleen. No worrisome renal lesions,, hydronephrosis or pyelonephritis. There is a lower pole right renal calculus and there are 2 distal left ureteral calculi more proximal calculus measures 4 mm and the more distal calculus measures 5 mm. I do not see any hydroureter or hydronephrosis. Stomach/Bowel: The stomach, duodenum, small bowel and colon are grossly normal. No inflammatory changes, mass lesions or obstructive findings. Lymphatic: No abdominal or pelvic lymphadenopathy. Reproductive: The prostate gland is enlarged. The seminal vesicles are unremarkable. Other: No pelvic mass or adenopathy. No free pelvic fluid collections. No inguinal mass or adenopathy. No abdominal wall hernia or subcutaneous lesions. Musculoskeletal: No significant bony findings. IMPRESSION: 1. Normal caliber abdominal aorta and no dissection. The branch vessels are normal. 2. Splenomegaly. 3. Two distal left ureteral calculi but no hydroureter or hydronephrosis. 4. Lower pole right renal calculus. 5. Moderate to large hiatal hernia. 6. Small left pleural effusion and bibasilar atelectasis. Electronically Signed   By: PMarijo SanesM.D.   On: 08/31/2022 18:13     ASSESSMENT & PLAN:    #1 JAK2-positive myeloproliferative neoplasm-primarily presenting with thrombocytosis and associated MDS causing refractory anemia -No polycythemia.  -Mild leukocytosis.  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 Has grade 1 out of 3 reticulin fibrosis.  Uncertain if this is primary or secondary. -LDH has remained within normal limits. 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 -CT Angio GI bleed  scan from 09/01/2022  showed kidney stones.and significant splenomegaly  PLAN: -We reviewed his labs results in detail. His WBC have improved significantly from 23.8 to 11.2.  HGB has improved from 5.9 to 7.2 and his HCT has improved from 17.5 to 21.8. His platelets have elevated from 649K to 388K today. He has some persistent mild hyperglycemia but his CMP is otherwise stable.  -He tolerated Vidaza cycle 1 well without significant toxicities. -Continue with daily B complex. -Continue Senna-S for constipation. Provided Rx today.  -Encouraged him to trial Prednisone taper. -He may use Tramadol prn for his LUQ pain. -He may also try a low compression, elastic abdominal/chest binder. -Will proceed with cycle 2 day 1 of Vidaza on 10/05/22.  Follow-up: -Follow-up for continued Vidaza treatment for cycle 2 and please schedule cycle 3 of Vidaza as per integrated scheduling. -Continue labs and PRBC transfusion as needed weekly x 8 -MD visit cycle 2-day 15. -NP visit currently as scheduled on cycle 2-day 1  The total time spent in the appointment was 30 minutes* .  All of the patient's questions were answered with apparent satisfaction. The patient knows to call the clinic with any problems, questions or concerns.   I,Alexis Herring,acting as a Education administrator for Sullivan Lone, MD.,have documented all relevant documentation on the behalf of Sullivan Lone, MD,as directed by  Sullivan Lone, MD while in the presence of Sullivan Lone, MD.  ***  Sullivan Lone MD Sharon AAHIVMS [facility] Hematology/Oncology Physician [facility]  .*Total Encounter Time as defined by the Centers for Medicare and Medicaid Services includes, in addition to the face-to-face time of a patient visit (documented in the note above) non-face-to-face time: obtaining and reviewing outside history, ordering and reviewing medications, tests or procedures, care coordination (communications with other health care professionals or  caregivers) and documentation in the medical record.

## 2022-09-29 ENCOUNTER — Other Ambulatory Visit: Payer: Self-pay

## 2022-09-30 ENCOUNTER — Other Ambulatory Visit: Payer: Self-pay

## 2022-09-30 ENCOUNTER — Inpatient Hospital Stay: Payer: Medicare Other

## 2022-09-30 ENCOUNTER — Encounter: Payer: Self-pay | Admitting: Hematology

## 2022-09-30 DIAGNOSIS — Z87891 Personal history of nicotine dependence: Secondary | ICD-10-CM | POA: Diagnosis not present

## 2022-09-30 DIAGNOSIS — E782 Mixed hyperlipidemia: Secondary | ICD-10-CM | POA: Diagnosis not present

## 2022-09-30 DIAGNOSIS — D469 Myelodysplastic syndrome, unspecified: Secondary | ICD-10-CM

## 2022-09-30 DIAGNOSIS — K449 Diaphragmatic hernia without obstruction or gangrene: Secondary | ICD-10-CM | POA: Diagnosis not present

## 2022-09-30 DIAGNOSIS — M1711 Unilateral primary osteoarthritis, right knee: Secondary | ICD-10-CM | POA: Diagnosis not present

## 2022-09-30 DIAGNOSIS — N202 Calculus of kidney with calculus of ureter: Secondary | ICD-10-CM | POA: Diagnosis not present

## 2022-09-30 DIAGNOSIS — D75839 Thrombocytosis, unspecified: Secondary | ICD-10-CM | POA: Diagnosis not present

## 2022-09-30 DIAGNOSIS — Z7952 Long term (current) use of systemic steroids: Secondary | ICD-10-CM | POA: Diagnosis not present

## 2022-09-30 DIAGNOSIS — K219 Gastro-esophageal reflux disease without esophagitis: Secondary | ICD-10-CM | POA: Diagnosis not present

## 2022-09-30 DIAGNOSIS — K5900 Constipation, unspecified: Secondary | ICD-10-CM | POA: Diagnosis not present

## 2022-09-30 DIAGNOSIS — D72829 Elevated white blood cell count, unspecified: Secondary | ICD-10-CM | POA: Diagnosis not present

## 2022-09-30 DIAGNOSIS — R739 Hyperglycemia, unspecified: Secondary | ICD-10-CM | POA: Diagnosis not present

## 2022-09-30 DIAGNOSIS — D649 Anemia, unspecified: Secondary | ICD-10-CM

## 2022-09-30 DIAGNOSIS — Z79899 Other long term (current) drug therapy: Secondary | ICD-10-CM | POA: Diagnosis not present

## 2022-09-30 DIAGNOSIS — Z5111 Encounter for antineoplastic chemotherapy: Secondary | ICD-10-CM | POA: Diagnosis not present

## 2022-09-30 DIAGNOSIS — J90 Pleural effusion, not elsewhere classified: Secondary | ICD-10-CM | POA: Diagnosis not present

## 2022-09-30 DIAGNOSIS — R161 Splenomegaly, not elsewhere classified: Secondary | ICD-10-CM | POA: Diagnosis not present

## 2022-09-30 LAB — CBC WITH DIFFERENTIAL (CANCER CENTER ONLY)
Abs Immature Granulocytes: 0.56 10*3/uL — ABNORMAL HIGH (ref 0.00–0.07)
Basophils Absolute: 0.2 10*3/uL — ABNORMAL HIGH (ref 0.0–0.1)
Basophils Relative: 3 %
Eosinophils Absolute: 0.2 10*3/uL (ref 0.0–0.5)
Eosinophils Relative: 3 %
HCT: 20.1 % — ABNORMAL LOW (ref 39.0–52.0)
Hemoglobin: 6.7 g/dL — CL (ref 13.0–17.0)
Immature Granulocytes: 6 %
Lymphocytes Relative: 3 %
Lymphs Abs: 0.3 10*3/uL — ABNORMAL LOW (ref 0.7–4.0)
MCH: 29.8 pg (ref 26.0–34.0)
MCHC: 33.3 g/dL (ref 30.0–36.0)
MCV: 89.3 fL (ref 80.0–100.0)
Monocytes Absolute: 0.4 10*3/uL (ref 0.1–1.0)
Monocytes Relative: 4 %
Neutro Abs: 7.9 10*3/uL — ABNORMAL HIGH (ref 1.7–7.7)
Neutrophils Relative %: 81 %
Platelet Count: 665 10*3/uL — ABNORMAL HIGH (ref 150–400)
RBC: 2.25 MIL/uL — ABNORMAL LOW (ref 4.22–5.81)
RDW: 16.6 % — ABNORMAL HIGH (ref 11.5–15.5)
WBC Count: 9.6 10*3/uL (ref 4.0–10.5)
nRBC: 0 % (ref 0.0–0.2)

## 2022-09-30 LAB — FERRITIN: Ferritin: 1450 ng/mL — ABNORMAL HIGH (ref 24–336)

## 2022-09-30 LAB — IRON AND IRON BINDING CAPACITY (CC-WL,HP ONLY)
Iron: 180 ug/dL (ref 45–182)
Saturation Ratios: 93 % — ABNORMAL HIGH (ref 17.9–39.5)
TIBC: 193 ug/dL — ABNORMAL LOW (ref 250–450)
UIBC: 13 ug/dL — ABNORMAL LOW (ref 117–376)

## 2022-09-30 LAB — PREPARE RBC (CROSSMATCH)

## 2022-09-30 MED ORDER — HEPARIN SOD (PORK) LOCK FLUSH 100 UNIT/ML IV SOLN
500.0000 [IU] | Freq: Every day | INTRAVENOUS | Status: AC | PRN
  Administered 2022-09-30: 500 [IU]

## 2022-09-30 MED ORDER — METHYLPREDNISOLONE SODIUM SUCC 40 MG IJ SOLR
40.0000 mg | Freq: Once | INTRAMUSCULAR | Status: AC
  Administered 2022-09-30: 40 mg via INTRAVENOUS
  Filled 2022-09-30: qty 1

## 2022-09-30 MED ORDER — SODIUM CHLORIDE 0.9% IV SOLUTION
250.0000 mL | Freq: Once | INTRAVENOUS | Status: AC
  Administered 2022-09-30: 250 mL via INTRAVENOUS

## 2022-09-30 MED ORDER — SODIUM CHLORIDE 0.9% FLUSH
10.0000 mL | INTRAVENOUS | Status: AC | PRN
  Administered 2022-09-30: 10 mL

## 2022-09-30 MED ORDER — SODIUM CHLORIDE 0.9% FLUSH
10.0000 mL | INTRAVENOUS | Status: DC | PRN
  Administered 2022-09-30: 10 mL via INTRAVENOUS

## 2022-09-30 MED ORDER — ACETAMINOPHEN 325 MG PO TABS
650.0000 mg | ORAL_TABLET | Freq: Once | ORAL | Status: AC
  Administered 2022-09-30: 650 mg via ORAL
  Filled 2022-09-30: qty 2

## 2022-10-01 ENCOUNTER — Inpatient Hospital Stay: Payer: Medicare Other

## 2022-10-01 DIAGNOSIS — D469 Myelodysplastic syndrome, unspecified: Secondary | ICD-10-CM | POA: Diagnosis not present

## 2022-10-01 DIAGNOSIS — E782 Mixed hyperlipidemia: Secondary | ICD-10-CM | POA: Diagnosis not present

## 2022-10-01 DIAGNOSIS — K449 Diaphragmatic hernia without obstruction or gangrene: Secondary | ICD-10-CM | POA: Diagnosis not present

## 2022-10-01 DIAGNOSIS — D75839 Thrombocytosis, unspecified: Secondary | ICD-10-CM | POA: Diagnosis not present

## 2022-10-01 DIAGNOSIS — M1711 Unilateral primary osteoarthritis, right knee: Secondary | ICD-10-CM | POA: Diagnosis not present

## 2022-10-01 DIAGNOSIS — Z7952 Long term (current) use of systemic steroids: Secondary | ICD-10-CM | POA: Diagnosis not present

## 2022-10-01 DIAGNOSIS — K219 Gastro-esophageal reflux disease without esophagitis: Secondary | ICD-10-CM | POA: Diagnosis not present

## 2022-10-01 DIAGNOSIS — J90 Pleural effusion, not elsewhere classified: Secondary | ICD-10-CM | POA: Diagnosis not present

## 2022-10-01 DIAGNOSIS — R161 Splenomegaly, not elsewhere classified: Secondary | ICD-10-CM | POA: Diagnosis not present

## 2022-10-01 DIAGNOSIS — N202 Calculus of kidney with calculus of ureter: Secondary | ICD-10-CM | POA: Diagnosis not present

## 2022-10-01 DIAGNOSIS — D72829 Elevated white blood cell count, unspecified: Secondary | ICD-10-CM | POA: Diagnosis not present

## 2022-10-01 DIAGNOSIS — Z79899 Other long term (current) drug therapy: Secondary | ICD-10-CM | POA: Diagnosis not present

## 2022-10-01 DIAGNOSIS — Z87891 Personal history of nicotine dependence: Secondary | ICD-10-CM | POA: Diagnosis not present

## 2022-10-01 DIAGNOSIS — Z5111 Encounter for antineoplastic chemotherapy: Secondary | ICD-10-CM | POA: Diagnosis not present

## 2022-10-01 DIAGNOSIS — K5900 Constipation, unspecified: Secondary | ICD-10-CM | POA: Diagnosis not present

## 2022-10-01 DIAGNOSIS — R739 Hyperglycemia, unspecified: Secondary | ICD-10-CM | POA: Diagnosis not present

## 2022-10-01 MED ORDER — METHYLPREDNISOLONE SODIUM SUCC 40 MG IJ SOLR
40.0000 mg | Freq: Once | INTRAMUSCULAR | Status: AC
  Administered 2022-10-01: 40 mg via INTRAVENOUS
  Filled 2022-10-01: qty 1

## 2022-10-01 MED ORDER — HEPARIN SOD (PORK) LOCK FLUSH 100 UNIT/ML IV SOLN
500.0000 [IU] | Freq: Every day | INTRAVENOUS | Status: AC | PRN
  Administered 2022-10-01: 500 [IU]

## 2022-10-01 MED ORDER — ACETAMINOPHEN 325 MG PO TABS
650.0000 mg | ORAL_TABLET | Freq: Once | ORAL | Status: AC
  Administered 2022-10-01: 650 mg via ORAL
  Filled 2022-10-01: qty 2

## 2022-10-01 MED ORDER — SODIUM CHLORIDE 0.9% IV SOLUTION
250.0000 mL | Freq: Once | INTRAVENOUS | Status: AC
  Administered 2022-10-01: 250 mL via INTRAVENOUS

## 2022-10-01 MED ORDER — SODIUM CHLORIDE 0.9% FLUSH
10.0000 mL | INTRAVENOUS | Status: AC | PRN
  Administered 2022-10-01: 10 mL

## 2022-10-01 MED ORDER — HEPARIN SOD (PORK) LOCK FLUSH 100 UNIT/ML IV SOLN: 250.0000 [IU] | INTRAVENOUS | Status: DC | PRN

## 2022-10-01 NOTE — Patient Instructions (Signed)
Blood Transfusion, Adult, Care After The following information offers guidance on how to care for yourself after your procedure. Your health care provider may also give you more specific instructions. If you have problems or questions, contact your health care provider. What can I expect after the procedure? After the procedure, it is common to have: Bruising and soreness where the IV was inserted. A headache. Follow these instructions at home: IV insertion site care     Follow instructions from your health care provider about how to take care of your IV insertion site. Make sure you: Wash your hands with soap and water for at least 20 seconds before and after you change your bandage (dressing). If soap and water are not available, use hand sanitizer. Change your dressing as told by your health care provider. Check your IV insertion site every day for signs of infection. Check for: Redness, swelling, or pain. Bleeding from the site. Warmth. Pus or a bad smell. General instructions Take over-the-counter and prescription medicines only as told by your health care provider. Rest as told by your health care provider. Return to your normal activities as told by your health care provider. Keep all follow-up visits. Lab tests may need to be done at certain periods to recheck your blood counts. Contact a health care provider if: You have itching or red, swollen areas of skin (hives). You have a fever or chills. You have pain in the head, back, or chest. You feel anxious or you feel weak after doing your normal activities. You have redness, swelling, warmth, or pain around the IV insertion site. You have blood coming from the IV insertion site that does not stop with pressure. You have pus or a bad smell coming from your IV insertion site. If you received your blood transfusion in an outpatient setting, you will be told whom to contact to report any reactions. Get help right away if: You  have symptoms of a serious allergic or immune system reaction, including: Trouble breathing or shortness of breath. Swelling of the face, feeling flushed, or widespread rash. Dark urine or blood in the urine. Fast heartbeat. These symptoms may be an emergency. Get help right away. Call 911. Do not wait to see if the symptoms will go away. Do not drive yourself to the hospital. Summary Bruising and soreness around the IV insertion site are common. Check your IV insertion site every day for signs of infection. Rest as told by your health care provider. Return to your normal activities as told by your health care provider. Get help right away for symptoms of a serious allergic or immune system reaction to the blood transfusion. This information is not intended to replace advice given to you by your health care provider. Make sure you discuss any questions you have with your health care provider. Document Revised: 01/01/2022 Document Reviewed: 01/01/2022 Elsevier Patient Education  [facility]  Discharge Instructions: Thank you for choosing [facility] to provide your oncology and hematology care.   If you have a lab appointment with the [facility], please go directly to the [facility] and check in at the registration area.   Wear comfortable clothing and clothing appropriate for easy access to any Portacath or PICC line.   We strive to give you quality time with your provider. You may need to reschedule your appointment if you arrive late (15 or more minutes).  Arriving late affects you and other patients  whose appointments are after yours.  Also, if you miss three or more appointments without notifying the office, you may be dismissed from the clinic at the provider's discretion.      For prescription refill requests, have your pharmacy contact our office and allow 72 hours for refills to be completed.    Today you  received the following chemotherapy and/or immunotherapy agent: Azacitidine (Vidaza).   To help prevent nausea and vomiting after your treatment, we encourage you to take your nausea medication as directed.  BELOW ARE SYMPTOMS THAT SHOULD BE REPORTED IMMEDIATELY: *FEVER GREATER THAN 100.4 F (38 C) OR HIGHER *CHILLS OR SWEATING *NAUSEA AND VOMITING THAT IS NOT CONTROLLED WITH YOUR NAUSEA MEDICATION *UNUSUAL SHORTNESS OF BREATH *UNUSUAL BRUISING OR BLEEDING *URINARY PROBLEMS (pain or burning when urinating, or frequent urination) *BOWEL PROBLEMS (unusual diarrhea, constipation, pain near the anus) TENDERNESS IN MOUTH AND THROAT WITH OR WITHOUT PRESENCE OF ULCERS (sore throat, sores in mouth, or a toothache) UNUSUAL RASH, SWELLING OR PAIN  UNUSUAL VAGINAL DISCHARGE OR ITCHING   Items with * indicate a potential emergency and should be followed up as soon as possible or go to the Emergency Department if any problems should occur.  Please show the CHEMOTHERAPY ALERT CARD or IMMUNOTHERAPY ALERT CARD at check-in to the Emergency Department and triage nurse.  Should you have questions after your visit or need to cancel or reschedule your appointment, please contact [facility]  Dept: [phone]  and follow the prompts.  Office hours are 8:00 a.m. to 4:30 p.m. Monday - Friday. Please note that voicemails left after 4:00 p.m. may not be returned until the following business day.  We are closed weekends and major holidays. You have access to a nurse at all times for urgent questions. Please call the main number to the clinic Dept: [phone] and follow the prompts.   For any non-urgent questions, you may also contact your provider using MyChart. We now offer e-Visits for anyone 85 and older to request care online for non-urgent symptoms. For details visit [email or web address].   Also download the MyChart app! Go to the app store, search "MyChart", open the app,  select [facility], and log in with your MyChart username and password.  Masks are optional in the cancer centers. If you would like for your care team to wear a mask while they are taking care of you, please let them know. You may have one support person who is at least 78 years old accompany you for your appointments.  Azacitidine Injection What is this medication? AZACITIDINE (ay [place]) treats blood and bone marrow cancers. It works by slowing down the growth of cancer cells. This medicine may be used for other purposes; ask your health care provider or pharmacist if you have questions. COMMON BRAND NAME(S): Vidaza What should I tell my care team before I take this medication? They need to know if you have any of these conditions: Kidney disease Liver disease Low blood cell levels, such as low white cells, platelets, or red blood cells Low levels of albumin in the blood Low levels of bicarbonate in the blood An unusual or allergic reaction to azacitidine, mannitol, other medications, foods, dyes, or preservatives If you or your partner are pregnant or trying to get pregnant Breast-feeding How should I use this medication? This medication is injected into a vein or under the skin. It is given by your care team in a hospital or clinic setting. Talk  to your care team about the use of this medication in children. While it may be prescribed for children as young as 1 month for selected conditions, precautions do apply. Overdosage: If you think you have taken too much of this medicine contact a poison control center or emergency room at once. NOTE: This medicine is only for you. Do not share this medicine with others. What if I miss a dose? Keep appointments for follow-up doses. It is important not to miss your dose. Call your care team if you are unable to keep an appointment. What may interact with this medication? Interactions are not expected. This list may not describe all  possible interactions. Give your health care provider a list of all the medicines, herbs, non-prescription drugs, or dietary supplements you use. Also tell them if you smoke, drink alcohol, or use illegal drugs. Some items may interact with your medicine. What should I watch for while using this medication? Your condition will be monitored carefully while you are receiving this medication. You may need blood work while taking this medication. This medication may make you feel generally unwell. This is not uncommon as chemotherapy can affect healthy cells as well as cancer cells. Report any side effects. Continue your course of treatment even though you feel ill unless your care team tells you to stop. Other product types may be available that contain the medication azacitidine. The injection and oral products should not be used in place of one another. Talk to your care team if you have questions. This medication can cause serious side effects. To reduce the risk, your care team may give you other medications to take before receiving this one. Be sure to follow the directions from your care team. This medication may increase your risk of getting an infection. Call your care team for advice if you get a fever, chills, sore throat, or other symptoms of a cold or flu. Do not treat yourself. Try to avoid being around people who are sick. Avoid taking medications that contain aspirin, acetaminophen, ibuprofen, naproxen, or ketoprofen unless instructed by your care team. These medications may hide a fever. This medication may increase your risk to bruise or bleed. Call your care team if you notice any unusual bleeding. Be careful brushing or flossing your teeth or using a toothpick because you may get an infection or bleed more easily. If you have any dental work done, tell your dentist you are receiving this medication. Talk to your care team if you or your partner may be pregnant. Serious birth defects can  occur if you take this medication during pregnancy and for 6 months after the last dose. You will need a negative pregnancy test before starting this medication. Contraception is recommended while taking his medication and for 6 months after the last dose. Your care team can help you find the option that works for you. If your partner can get pregnant, use a condom during sex while taking this medication and for 3 months after the last dose. Do not breastfeed while taking this medication and for 1 week after the last dose. This medication may cause infertility. Talk to your care team if you are concerned about your fertility. What side effects may I notice from receiving this medication? Side effects that you should report to your care team as soon as possible: Allergic reactions--skin rash, itching, hives, swelling of the face, lips, tongue, or throat Infection--fever, chills, cough, sore throat, wounds that don't heal, pain or trouble when  passing urine, general feeling of discomfort or being unwell Kidney injury--decrease in the amount of urine, swelling of the ankles, hands, or feet Liver injury--right upper belly pain, loss of appetite, nausea, light-colored stool, dark yellow or brown urine, yellowing skin or eyes, unusual weakness or fatigue Low red blood cell level--unusual weakness or fatigue, dizziness, headache, trouble breathing Tumor lysis syndrome (TLS)--nausea, vomiting, diarrhea, decrease in the amount of urine, dark urine, unusual weakness or fatigue, confusion, muscle pain or cramps, fast or irregular heartbeat, joint pain Unusual bruising or bleeding Side effects that usually do not require medical attention (report to your care team if they continue or are bothersome): Constipation Diarrhea Nausea Pain, redness, or irritation at injection site Vomiting This list may not describe all possible side effects. Call your doctor for medical advice about side effects. You may report  side effects to FDA at 1-800-FDA-1088. Where should I keep my medication? This medication is given in a hospital or clinic. It will not be stored at home. NOTE: This sheet is a summary. It may not cover all possible information. If you have questions about this medicine, talk to your doctor, pharmacist, or health care provider.  2023 Elsevier/Gold Standard (2022-02-18 00:00:00)

## 2022-10-01 NOTE — Progress Notes (Unsigned)
[facility] OFFICE PROGRESS NOTE  Lurline Del, DO [place] [address]  DIAGNOSIS: Follow-up for continued evaluation and management of JAK2 positive myeloproliferative neoplasm/MDS   CURRENT THERAPY: Vidaza days 1-5 every 4 weeks. Status post 1 cycle   INTERVAL HISTORY: Fernando Lee 76 y.o. male returns to clinic today for follow-up visit accompanied by ***.  The patient was last seen in clinic by Dr. Irene Limbo on 09/24/2022.  The patient is being followed for MPN-MDS with refractory anemia and thrombocytosis.  He is status post 1 cycle of 5 days and tolerated it well except for some fatigue.  At the patient's last appointment he was endorsing some constipation for which Dr. Irene Limbo sent a prescription for Senokot-S which has ***his symptoms.  Energy???    Nausea.   Today the patient denies any fever, chills, or night sweats.  Weight loss?  Appetite?  At the patient's last appointment, he was hesitant to start his steroids but after a discussion with Dr. Rod Can he eventually ***.  He is currently taking ***.  He denies any signs and symptoms of infection including sore throat, cough, shortness of breath, skin infections, abdominal pain?  Diarrhea, or dysuria.  He does take tramadol as needed for left upper quadrant discomfort.  He tries not to take tramadol unless needed due to it causing drowsiness.  He denies any abnormal bleeding or bruising.  He is compliant with his B vitamin.   The patient is here today for evaluation and repeat blood work before starting day 1 cycle 2  MEDICAL HISTORY: Past Medical History:  Diagnosis Date   Arthritis of right knee    Cervicalgia    Chest discomfort    normal stress echo   Chest pain    Colon polyps    Dyslipidemia    Fluttering sensation of heart    GERD without esophagitis    Hematochezia    Hyperglycemia    Hyperlipidemia    Internal hemorrhoids    Kidney stones    Male erectile dysfunction, unspecified     Mixed dyslipidemia    Mixed hyperlipidemia    Ocular migraine    PAC (premature atrial contraction)    Personal history of colonic polyps    Prediabetes    Residual hemorrhoidal skin tags    Spleen enlarged    nov 2023 hospitalized   Unspecified hemorrhoids     ALLERGIES:  has No Known Allergies.  MEDICATIONS:  Current Outpatient Medications  Medication Sig Dispense Refill   ALFUZOSIN HCL ER PO      aspirin EC 81 MG tablet Take 81 mg by mouth daily. Swallow whole.     B Complex Vitamins (B COMPLEX PO) Take 1 tablet by mouth daily.     Cholecalciferol (VITAMIN D3) 50 MCG (2000 UT) TABS Take 2,000 Units by mouth daily.     docusate sodium (COLACE) 100 MG capsule Take 100 mg by mouth 2 (two) times daily.     FIBER PO Take 1 capsule by mouth daily. Unknown strength     fish oil-omega-3 fatty acids 1000 MG capsule Take 1 g by mouth 2 (two) times daily. Strength 3109m/1500mg     Flaxseed, Linseed, (FLAXSEED OIL) 1000 MG CAPS Take 1,000 mg by mouth daily.     GLUCOSAMINE CHONDROITIN MSM PO Take 1 tablet by mouth daily. Strength 1500/1500     lidocaine-prilocaine (EMLA) cream Apply to affected area once 30 g 3   Misc Natural Products (PROSTATE  SUPPORT PO) Take 1 capsule by mouth daily. Unknown strength     Multiple Vitamin (MULTIVITAMIN) tablet Take 1 tablet by mouth daily. Unknown strength     ondansetron (ZOFRAN) 8 MG tablet Take 1 tablet (8 mg total) by mouth every 8 (eight) hours as needed for nausea or vomiting. 30 tablet 1   predniSONE (DELTASONE) 10 MG tablet 4 tabs daily for 4 days 3 tabs daily for 4 days 2 tabs daily for 4 days 1 tab daily for 4 days 40 tablet 0   prochlorperazine (COMPAZINE) 10 MG tablet Take 1 tablet (10 mg total) by mouth every 6 (six) hours as needed for nausea or vomiting. 30 tablet 1   psyllium (METAMUCIL) 58.6 % powder Take 1 packet by mouth daily.     senna-docusate (SENNA S) 8.6-50 MG tablet Take 2 tablets by mouth at bedtime. 60 tablet 1   Turmeric (QC  TUMERIC COMPLEX PO) Take 750 mg by mouth daily.     vitamin C (ASCORBIC ACID) 250 MG tablet Take 500 mg by mouth daily.     No current facility-administered medications for this visit.   Facility-Administered Medications Ordered in Other Visits  Medication Dose Route Frequency Provider Last Rate Last Admin   heparin lock flush 100 unit/mL  250 Units Intracatheter PRN Brunetta Genera, MD       sodium chloride flush (NS) 0.9 % injection 10 mL  10 mL Intracatheter PRN Brunetta Genera, MD        SURGICAL HISTORY:  Past Surgical History:  Procedure Laterality Date   BONE MARROW BIOPSY     IR IMAGING GUIDED PORT INSERTION  09/14/2022   KIDNEY STONE SURGERY     retrieval    REVIEW OF SYSTEMS:   Review of Systems  Constitutional: Negative for appetite change, chills, fatigue, fever and unexpected weight change.  HENT:   Negative for mouth sores, nosebleeds, sore throat and trouble swallowing.   Eyes: Negative for eye problems and icterus.  Respiratory: Negative for cough, hemoptysis, shortness of breath and wheezing.   Cardiovascular: Negative for chest pain and leg swelling.  Gastrointestinal: Negative for abdominal pain, constipation, diarrhea, nausea and vomiting.  Genitourinary: Negative for bladder incontinence, difficulty urinating, dysuria, frequency and hematuria.   Musculoskeletal: Negative for back pain, gait problem, neck pain and neck stiffness.  Skin: Negative for itching and rash.  Neurological: Negative for dizziness, extremity weakness, gait problem, headaches, light-headedness and seizures.  Hematological: Negative for adenopathy. Does not bruise/bleed easily.  Psychiatric/Behavioral: Negative for confusion, depression and sleep disturbance. The patient is not nervous/anxious.     PHYSICAL EXAMINATION:  There were no vitals taken for this visit.  ECOG PERFORMANCE STATUS: {CHL ONC ECOG [id number]  Physical Exam  Constitutional: Oriented to person,  place, and time and well-developed, well-nourished, and in no distress. No distress.  HENT:  Head: Normocephalic and atraumatic.  Mouth/Throat: Oropharynx is clear and moist. No oropharyngeal exudate.  Eyes: Conjunctivae are normal. Right eye exhibits no discharge. Left eye exhibits no discharge. No scleral icterus.  Neck: Normal range of motion. Neck supple.  Cardiovascular: Normal rate, regular rhythm, normal heart sounds and intact distal pulses.   Pulmonary/Chest: Effort normal and breath sounds normal. No respiratory distress. No wheezes. No rales.  Abdominal: Soft. Bowel sounds are normal. Exhibits no distension and no mass. There is no tenderness.  Musculoskeletal: Normal range of motion. Exhibits no edema.  Lymphadenopathy:    No cervical adenopathy.  Neurological: Alert and oriented to person, place,  and time. Exhibits normal muscle tone. Gait normal. Coordination normal.  Skin: Skin is warm and dry. No rash noted. Not diaphoretic. No erythema. No pallor.  Psychiatric: Mood, memory and judgment normal.  Vitals reviewed.  LABORATORY DATA: Lab Results  Component Value Date   WBC 9.6 09/30/2022   HGB 6.7 (LL) 09/30/2022   HCT 20.1 (L) 09/30/2022   MCV 89.3 09/30/2022   PLT 665 (H) 09/30/2022      Chemistry      Component Value Date/Time   NA 134 (L) 09/23/2022 0845   K 4.2 09/23/2022 0845   CL 102 09/23/2022 0845   CO2 23 09/23/2022 0845   BUN 17 09/23/2022 0845   CREATININE 0.93 09/23/2022 0845      Component Value Date/Time   CALCIUM 8.6 (L) 09/23/2022 0845   ALKPHOS 60 09/23/2022 0845   AST 10 (L) 09/23/2022 0845   ALT 10 09/23/2022 0845   BILITOT 1.3 (H) 09/23/2022 0845       RADIOGRAPHIC STUDIES:  IR IMAGING GUIDED PORT INSERTION  Result Date: 09/14/2022 INDICATION: Poor IV access.  Myelodysplastic syndrome EXAM: IMPLANTED PORT A CATH PLACEMENT WITH ULTRASOUND AND FLUOROSCOPIC GUIDANCE MEDICATIONS: None ANESTHESIA/SEDATION: Moderate (conscious) sedation  was employed during this procedure. A total of Versed 3 mg and Fentanyl 100 mcg was administered intravenously. Moderate Sedation Time: 20 minutes. The patient's level of consciousness and vital signs were monitored continuously by radiology nursing throughout the procedure under my direct supervision. FLUOROSCOPY TIME:  Fluoroscopic dose; 1 mGy COMPLICATIONS: None immediate. PROCEDURE: The procedure, risks, benefits, and alternatives were explained to the patient. Questions regarding the procedure were encouraged and answered. The patient understands and consents to the procedure. The RIGHT neck and chest were prepped with chlorhexidine in a sterile fashion, and a sterile drape was applied covering the operative field. Maximum barrier sterile technique with sterile gowns and gloves were used for the procedure. A timeout was performed prior to the initiation of the procedure. Local anesthesia was provided with 1% lidocaine with epinephrine. After creating a small venotomy incision, a micropuncture kit was utilized to access the internal jugular vein under direct, real-time ultrasound guidance. Ultrasound image documentation was performed. The microwire was kinked to measure appropriate catheter length. A subcutaneous port pocket was then created along the upper chest wall utilizing a combination of sharp and blunt dissection. The pocket was irrigated with sterile saline. A single lumen ISP power injectable port was chosen for placement. The 8 Fr catheter was tunneled from the port pocket site to the venotomy incision. The port was placed in the pocket. The external catheter was trimmed to appropriate length. At the venotomy, an 8 Fr peel-away sheath was placed over a guidewire under fluoroscopic guidance. The catheter was then placed through the sheath and the sheath was removed. Final catheter positioning was confirmed and documented with a fluoroscopic spot radiograph. The port was accessed with a Huber needle,  aspirated and flushed with heparinized saline. The port pocket incision was closed with interrupted 3-0 Vicryl suture then Dermabond was applied, including at the venotomy incision. Dressings were placed. The patient tolerated the procedure well without immediate post procedural complication. IMPRESSION: Successful placement of a RIGHT internal jugular approach power injectable Port-A-Cath. The tip of the catheter is positioned within the proximal RIGHT atrium. The catheter is ready for immediate use. Michaelle Birks, MD [facility]iology Electronically Signed   By: Michaelle Birks M.D.   On: 09/14/2022 17:18  ASSESSMENT/PLAN:  #1 JAK2-positive myeloproliferative neoplasm-primarily presenting with thrombocytosis and associated MDS causing refractory anemia -No polycythemia.  -Mild leukocytosis.  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 Has grade 1 out of 3 reticulin fibrosis.  Uncertain if this is primary or secondary. -LDH has remained within normal limits. 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 -CT Angio GI bleed scan from 09/01/2022 showed kidney stones.and significant splenomegaly   PLAN: -We reviewed his labs results in detail. His WBC have **** from 23.8 to 11.2 to *** today -HGB has improved from 5.9 to 7.2 to *** today and his HCT has improved from 17.5 to 21.8. His platelets have *** -CMP is *** -He tolerated Vidaza cycle 1 well without significant toxicities. -Continue with daily B complex. -Continue Senna-S for constipation.  -Prednisone *** -Dr. Irene Limbo previously prescribed Tramadol prn for his LUQ pain. -He may also try a low compression, elastic abdominal/chest binder. -Will proceed MD follow up on cycle 2 day 15 on 10/22/22.   Follow-up: -Continue labs and PRBC transfusion as needed weekly x 8 -MD visit cycle 2-day 15.   No orders of the defined types were placed in this encounter.     I spent {CHL ONC TIME VISIT - RXYVO:[id number]} counseling the patient face to face. The total time spent in the appointment was {CHL ONC TIME VISIT - MNOTR:[id number]}.  Letecia Arps L Chasiti Waddington, PA-C 10/01/22

## 2022-10-02 LAB — TYPE AND SCREEN
ABO/RH(D): A POS
Antibody Screen: NEGATIVE
Unit division: 0
Unit division: 0

## 2022-10-02 LAB — BPAM RBC
Blood Product Expiration Date: 202401122359
Blood Product Expiration Date: 202401122359
ISSUE DATE / TIME: 202312141113
ISSUE DATE / TIME: 202312150852
Unit Type and Rh: 6200
Unit Type and Rh: 6200

## 2022-10-04 ENCOUNTER — Inpatient Hospital Stay: Payer: Medicare Other

## 2022-10-04 ENCOUNTER — Other Ambulatory Visit: Payer: Self-pay | Admitting: *Deleted

## 2022-10-04 ENCOUNTER — Inpatient Hospital Stay (HOSPITAL_BASED_OUTPATIENT_CLINIC_OR_DEPARTMENT_OTHER): Payer: Medicare Other | Admitting: Physician Assistant

## 2022-10-04 ENCOUNTER — Other Ambulatory Visit: Payer: Self-pay

## 2022-10-04 DIAGNOSIS — Z7952 Long term (current) use of systemic steroids: Secondary | ICD-10-CM | POA: Diagnosis not present

## 2022-10-04 DIAGNOSIS — D469 Myelodysplastic syndrome, unspecified: Secondary | ICD-10-CM

## 2022-10-04 DIAGNOSIS — D75839 Thrombocytosis, unspecified: Secondary | ICD-10-CM | POA: Diagnosis not present

## 2022-10-04 DIAGNOSIS — D649 Anemia, unspecified: Secondary | ICD-10-CM

## 2022-10-04 DIAGNOSIS — M1711 Unilateral primary osteoarthritis, right knee: Secondary | ICD-10-CM | POA: Diagnosis not present

## 2022-10-04 DIAGNOSIS — Z79899 Other long term (current) drug therapy: Secondary | ICD-10-CM | POA: Diagnosis not present

## 2022-10-04 DIAGNOSIS — Z87891 Personal history of nicotine dependence: Secondary | ICD-10-CM | POA: Diagnosis not present

## 2022-10-04 DIAGNOSIS — K219 Gastro-esophageal reflux disease without esophagitis: Secondary | ICD-10-CM | POA: Diagnosis not present

## 2022-10-04 DIAGNOSIS — N202 Calculus of kidney with calculus of ureter: Secondary | ICD-10-CM | POA: Diagnosis not present

## 2022-10-04 DIAGNOSIS — K449 Diaphragmatic hernia without obstruction or gangrene: Secondary | ICD-10-CM | POA: Diagnosis not present

## 2022-10-04 DIAGNOSIS — K5900 Constipation, unspecified: Secondary | ICD-10-CM | POA: Diagnosis not present

## 2022-10-04 DIAGNOSIS — J90 Pleural effusion, not elsewhere classified: Secondary | ICD-10-CM | POA: Diagnosis not present

## 2022-10-04 DIAGNOSIS — R161 Splenomegaly, not elsewhere classified: Secondary | ICD-10-CM | POA: Diagnosis not present

## 2022-10-04 DIAGNOSIS — R739 Hyperglycemia, unspecified: Secondary | ICD-10-CM | POA: Diagnosis not present

## 2022-10-04 DIAGNOSIS — Z5111 Encounter for antineoplastic chemotherapy: Secondary | ICD-10-CM | POA: Diagnosis not present

## 2022-10-04 DIAGNOSIS — E782 Mixed hyperlipidemia: Secondary | ICD-10-CM | POA: Diagnosis not present

## 2022-10-04 DIAGNOSIS — D72829 Elevated white blood cell count, unspecified: Secondary | ICD-10-CM | POA: Diagnosis not present

## 2022-10-04 LAB — CBC WITH DIFFERENTIAL (CANCER CENTER ONLY)
Abs Immature Granulocytes: 0.65 10*3/uL — ABNORMAL HIGH (ref 0.00–0.07)
Basophils Absolute: 0.3 10*3/uL — ABNORMAL HIGH (ref 0.0–0.1)
Basophils Relative: 3 %
Eosinophils Absolute: 0.4 10*3/uL (ref 0.0–0.5)
Eosinophils Relative: 4 %
HCT: 25.2 % — ABNORMAL LOW (ref 39.0–52.0)
Hemoglobin: 8.4 g/dL — ABNORMAL LOW (ref 13.0–17.0)
Immature Granulocytes: 6 %
Lymphocytes Relative: 4 %
Lymphs Abs: 0.5 10*3/uL — ABNORMAL LOW (ref 0.7–4.0)
MCH: 29.2 pg (ref 26.0–34.0)
MCHC: 33.3 g/dL (ref 30.0–36.0)
MCV: 87.5 fL (ref 80.0–100.0)
Monocytes Absolute: 0.5 10*3/uL (ref 0.1–1.0)
Monocytes Relative: 4 %
Neutro Abs: 8.4 10*3/uL — ABNORMAL HIGH (ref 1.7–7.7)
Neutrophils Relative %: 79 %
Platelet Count: 651 10*3/uL — ABNORMAL HIGH (ref 150–400)
RBC: 2.88 MIL/uL — ABNORMAL LOW (ref 4.22–5.81)
RDW: 15.9 % — ABNORMAL HIGH (ref 11.5–15.5)
WBC Count: 10.6 10*3/uL — ABNORMAL HIGH (ref 4.0–10.5)
nRBC: 0 % (ref 0.0–0.2)

## 2022-10-04 LAB — CMP (CANCER CENTER ONLY)
ALT: 17 U/L (ref 0–44)
AST: 10 U/L — ABNORMAL LOW (ref 15–41)
Albumin: 3.7 g/dL (ref 3.5–5.0)
Alkaline Phosphatase: 49 U/L (ref 38–126)
Anion gap: 5 (ref 5–15)
BUN: 27 mg/dL — ABNORMAL HIGH (ref 8–23)
CO2: 28 mmol/L (ref 22–32)
Calcium: 8.8 mg/dL — ABNORMAL LOW (ref 8.9–10.3)
Chloride: 102 mmol/L (ref 98–111)
Creatinine: 1.02 mg/dL (ref 0.61–1.24)
GFR, Estimated: >60 mL/min (ref >60)
Glucose, Bld: 173 mg/dL — ABNORMAL HIGH (ref 70–99)
Potassium: 4.4 mmol/L (ref 3.5–5.1)
Sodium: 135 mmol/L (ref 135–145)
Total Bilirubin: 0.7 mg/dL (ref 0.3–1.2)
Total Protein: 5.3 g/dL — ABNORMAL LOW (ref 6.5–8.1)

## 2022-10-04 MED ORDER — HEPARIN SOD (PORK) LOCK FLUSH 100 UNIT/ML IV SOLN
500.0000 [IU] | Freq: Once | INTRAVENOUS | Status: AC | PRN
  Administered 2022-10-04: 500 [IU]

## 2022-10-04 MED ORDER — SODIUM CHLORIDE 0.9% FLUSH
10.0000 mL | INTRAVENOUS | Status: DC | PRN
  Administered 2022-10-04: 10 mL

## 2022-10-04 MED ORDER — SODIUM CHLORIDE 0.9 % IV SOLN
75.0000 mg/m2 | Freq: Once | INTRAVENOUS | Status: AC
  Administered 2022-10-04: 140 mg via INTRAVENOUS
  Filled 2022-10-04: qty 14

## 2022-10-04 MED ORDER — ONDANSETRON HCL 8 MG PO TABS
8.0000 mg | ORAL_TABLET | Freq: Once | ORAL | Status: AC
  Administered 2022-10-04: 8 mg via ORAL
  Filled 2022-10-04: qty 1

## 2022-10-04 MED ORDER — SODIUM CHLORIDE 0.9 % IV SOLN: Freq: Once | INTRAVENOUS | Status: AC

## 2022-10-04 NOTE — Patient Instructions (Signed)
[facility]  Discharge Instructions: Thank you for choosing [facility] to provide your oncology and hematology care.   If you have a lab appointment with the [facility], please go directly to the [facility] and check in at the registration area.   Wear comfortable clothing and clothing appropriate for easy access to any Portacath or PICC line.   We strive to give you quality time with your provider. You may need to reschedule your appointment if you arrive late (15 or more minutes).  Arriving late affects you and other patients whose appointments are after yours.  Also, if you miss three or more appointments without notifying the office, you may be dismissed from the clinic at the provider's discretion.      For prescription refill requests, have your pharmacy contact our office and allow 72 hours for refills to be completed.    Today you received the following chemotherapy and/or immunotherapy agents: Vidaza      To help prevent nausea and vomiting after your treatment, we encourage you to take your nausea medication as directed.  BELOW ARE SYMPTOMS THAT SHOULD BE REPORTED IMMEDIATELY: *FEVER GREATER THAN 100.4 F (38 C) OR HIGHER *CHILLS OR SWEATING *NAUSEA AND VOMITING THAT IS NOT CONTROLLED WITH YOUR NAUSEA MEDICATION *UNUSUAL SHORTNESS OF BREATH *UNUSUAL BRUISING OR BLEEDING *URINARY PROBLEMS (pain or burning when urinating, or frequent urination) *BOWEL PROBLEMS (unusual diarrhea, constipation, pain near the anus) TENDERNESS IN MOUTH AND THROAT WITH OR WITHOUT PRESENCE OF ULCERS (sore throat, sores in mouth, or a toothache) UNUSUAL RASH, SWELLING OR PAIN  UNUSUAL VAGINAL DISCHARGE OR ITCHING   Items with * indicate a potential emergency and should be followed up as soon as possible or go to the Emergency Department if any problems should occur.  Please show the CHEMOTHERAPY ALERT CARD or IMMUNOTHERAPY ALERT CARD at check-in to the  Emergency Department and triage nurse.  Should you have questions after your visit or need to cancel or reschedule your appointment, please contact [facility]  Dept: [phone]  and follow the prompts.  Office hours are 8:00 a.m. to 4:30 p.m. Monday - Friday. Please note that voicemails left after 4:00 p.m. may not be returned until the following business day.  We are closed weekends and major holidays. You have access to a nurse at all times for urgent questions. Please call the main number to the clinic Dept: [phone] and follow the prompts.   For any non-urgent questions, you may also contact your provider using MyChart. We now offer e-Visits for anyone 54 and older to request care online for non-urgent symptoms. For details visit [email or web address].   Also download the MyChart app! Go to the app store, search "MyChart", open the app, select [facility], and log in with your MyChart username and password.  Masks are optional in the cancer centers. If you would like for your care team to wear a mask while they are taking care of you, please let them know. You may have one support person who is at least 78 years old accompany you for your appointments. Blood Transfusion, Adult, Care After The following information offers guidance on how to care for yourself after your procedure. Your health care provider may also give you more specific instructions. If you have problems or questions, contact your health care provider. What can I expect after the procedure? After the procedure, it is common to have: Bruising and soreness where the IV  was inserted. A headache. Follow these instructions at home: IV insertion site care     Follow instructions from your health care provider about how to take care of your IV insertion site. Make sure you: Wash your hands with soap and water for at least 20 seconds before and after you change your bandage (dressing). If soap  and water are not available, use hand sanitizer. Change your dressing as told by your health care provider. Check your IV insertion site every day for signs of infection. Check for: Redness, swelling, or pain. Bleeding from the site. Warmth. Pus or a bad smell. General instructions Take over-the-counter and prescription medicines only as told by your health care provider. Rest as told by your health care provider. Return to your normal activities as told by your health care provider. Keep all follow-up visits. Lab tests may need to be done at certain periods to recheck your blood counts. Contact a health care provider if: You have itching or red, swollen areas of skin (hives). You have a fever or chills. You have pain in the head, back, or chest. You feel anxious or you feel weak after doing your normal activities. You have redness, swelling, warmth, or pain around the IV insertion site. You have blood coming from the IV insertion site that does not stop with pressure. You have pus or a bad smell coming from your IV insertion site. If you received your blood transfusion in an outpatient setting, you will be told whom to contact to report any reactions. Get help right away if: You have symptoms of a serious allergic or immune system reaction, including: Trouble breathing or shortness of breath. Swelling of the face, feeling flushed, or widespread rash. Dark urine or blood in the urine. Fast heartbeat. These symptoms may be an emergency. Get help right away. Call 911. Do not wait to see if the symptoms will go away. Do not drive yourself to the hospital. Summary Bruising and soreness around the IV insertion site are common. Check your IV insertion site every day for signs of infection. Rest as told by your health care provider. Return to your normal activities as told by your health care provider. Get help right away for symptoms of a serious allergic or immune system reaction to the  blood transfusion. This information is not intended to replace advice given to you by your health care provider. Make sure you discuss any questions you have with your health care provider. Document Revised: 01/01/2022 Document Reviewed: 01/01/2022 Elsevier Patient Education  [place].

## 2022-10-05 ENCOUNTER — Inpatient Hospital Stay: Payer: Medicare Other

## 2022-10-05 VITALS — BP 101/56 | HR 69 | Temp 98.4°F | Resp 17

## 2022-10-05 DIAGNOSIS — Z7952 Long term (current) use of systemic steroids: Secondary | ICD-10-CM | POA: Diagnosis not present

## 2022-10-05 DIAGNOSIS — N202 Calculus of kidney with calculus of ureter: Secondary | ICD-10-CM | POA: Diagnosis not present

## 2022-10-05 DIAGNOSIS — D649 Anemia, unspecified: Secondary | ICD-10-CM

## 2022-10-05 DIAGNOSIS — M1711 Unilateral primary osteoarthritis, right knee: Secondary | ICD-10-CM | POA: Diagnosis not present

## 2022-10-05 DIAGNOSIS — E782 Mixed hyperlipidemia: Secondary | ICD-10-CM | POA: Diagnosis not present

## 2022-10-05 DIAGNOSIS — Z5111 Encounter for antineoplastic chemotherapy: Secondary | ICD-10-CM | POA: Diagnosis not present

## 2022-10-05 DIAGNOSIS — D75839 Thrombocytosis, unspecified: Secondary | ICD-10-CM | POA: Diagnosis not present

## 2022-10-05 DIAGNOSIS — K219 Gastro-esophageal reflux disease without esophagitis: Secondary | ICD-10-CM | POA: Diagnosis not present

## 2022-10-05 DIAGNOSIS — J90 Pleural effusion, not elsewhere classified: Secondary | ICD-10-CM | POA: Diagnosis not present

## 2022-10-05 DIAGNOSIS — R739 Hyperglycemia, unspecified: Secondary | ICD-10-CM | POA: Diagnosis not present

## 2022-10-05 DIAGNOSIS — K5900 Constipation, unspecified: Secondary | ICD-10-CM | POA: Diagnosis not present

## 2022-10-05 DIAGNOSIS — D469 Myelodysplastic syndrome, unspecified: Secondary | ICD-10-CM

## 2022-10-05 DIAGNOSIS — K449 Diaphragmatic hernia without obstruction or gangrene: Secondary | ICD-10-CM | POA: Diagnosis not present

## 2022-10-05 DIAGNOSIS — R161 Splenomegaly, not elsewhere classified: Secondary | ICD-10-CM | POA: Diagnosis not present

## 2022-10-05 DIAGNOSIS — Z79899 Other long term (current) drug therapy: Secondary | ICD-10-CM | POA: Diagnosis not present

## 2022-10-05 DIAGNOSIS — Z87891 Personal history of nicotine dependence: Secondary | ICD-10-CM | POA: Diagnosis not present

## 2022-10-05 DIAGNOSIS — D72829 Elevated white blood cell count, unspecified: Secondary | ICD-10-CM | POA: Diagnosis not present

## 2022-10-05 MED ORDER — SODIUM CHLORIDE 0.9 % IV SOLN: Freq: Once | INTRAVENOUS | Status: AC

## 2022-10-05 MED ORDER — ONDANSETRON HCL 8 MG PO TABS
8.0000 mg | ORAL_TABLET | Freq: Once | ORAL | Status: AC
  Administered 2022-10-05: 8 mg via ORAL
  Filled 2022-10-05: qty 1

## 2022-10-05 MED ORDER — SODIUM CHLORIDE 0.9 % IV SOLN
75.0000 mg/m2 | Freq: Once | INTRAVENOUS | Status: AC
  Administered 2022-10-05: 140 mg via INTRAVENOUS
  Filled 2022-10-05: qty 14

## 2022-10-05 MED ORDER — HEPARIN SOD (PORK) LOCK FLUSH 100 UNIT/ML IV SOLN
500.0000 [IU] | Freq: Once | INTRAVENOUS | Status: AC | PRN
  Administered 2022-10-05: 500 [IU]

## 2022-10-05 MED ORDER — SODIUM CHLORIDE 0.9% FLUSH
10.0000 mL | INTRAVENOUS | Status: DC | PRN
  Administered 2022-10-05: 10 mL

## 2022-10-05 NOTE — Patient Instructions (Signed)
[facility]  Discharge Instructions: Thank you for choosing [facility] to provide your oncology and hematology care.   If you have a lab appointment with the Cancer Center, please go directly to the Cancer Center and check in at the registration area.   Wear comfortable clothing and clothing appropriate for easy access to any Portacath or PICC line.   We strive to give you quality time with your provider. You may need to reschedule your appointment if you arrive late (15 or more minutes).  Arriving late affects you and other patients whose appointments are after yours.  Also, if you miss three or more appointments without notifying the office, you may be dismissed from the clinic at the provider's discretion.      For prescription refill requests, have your pharmacy contact our office and allow 72 hours for refills to be completed.    Today you received the following chemotherapy and/or immunotherapy agents: azacitidine      To help prevent nausea and vomiting after your treatment, we encourage you to take your nausea medication as directed.  BELOW ARE SYMPTOMS THAT SHOULD BE REPORTED IMMEDIATELY: *FEVER GREATER THAN 100.4 F (38 C) OR HIGHER *CHILLS OR SWEATING *NAUSEA AND VOMITING THAT IS NOT CONTROLLED WITH YOUR NAUSEA MEDICATION *UNUSUAL SHORTNESS OF BREATH *UNUSUAL BRUISING OR BLEEDING *URINARY PROBLEMS (pain or burning when urinating, or frequent urination) *BOWEL PROBLEMS (unusual diarrhea, constipation, pain near the anus) TENDERNESS IN MOUTH AND THROAT WITH OR WITHOUT PRESENCE OF ULCERS (sore throat, sores in mouth, or a toothache) UNUSUAL RASH, SWELLING OR PAIN  UNUSUAL VAGINAL DISCHARGE OR ITCHING   Items with * indicate a potential emergency and should be followed up as soon as possible or go to the Emergency Department if any problems should occur.  Please show the CHEMOTHERAPY ALERT CARD or IMMUNOTHERAPY ALERT CARD at check-in  to the Emergency Department and triage nurse.  Should you have questions after your visit or need to cancel or reschedule your appointment, please contact [facility]  Dept: [phone]  and follow the prompts.  Office hours are 8:00 a.m. to 4:30 p.m. Monday - Friday. Please note that voicemails left after 4:00 p.m. may not be returned until the following business day.  We are closed weekends and major holidays. You have access to a nurse at all times for urgent questions. Please call the main number to the clinic Dept: [phone] and follow the prompts.   For any non-urgent questions, you may also contact your provider using MyChart. We now offer e-Visits for anyone 18 and older to request care online for non-urgent symptoms. For details visit [email or web address].   Also download the MyChart app! Go to the app store, search "MyChart", open the app, select [facility], and log in with your MyChart username and password.  Masks are optional in the cancer centers. If you would like for your care team to wear a mask while they are taking care of you, please let them know. You may have one support person who is at least 78 years old accompany you for your appointments. 

## 2022-10-06 ENCOUNTER — Other Ambulatory Visit: Payer: Self-pay | Admitting: *Deleted

## 2022-10-06 ENCOUNTER — Inpatient Hospital Stay: Payer: Medicare Other

## 2022-10-06 VITALS — BP 105/58 | HR 67 | Temp 97.7°F | Resp 18

## 2022-10-06 DIAGNOSIS — K5900 Constipation, unspecified: Secondary | ICD-10-CM | POA: Diagnosis not present

## 2022-10-06 DIAGNOSIS — D469 Myelodysplastic syndrome, unspecified: Secondary | ICD-10-CM

## 2022-10-06 DIAGNOSIS — N202 Calculus of kidney with calculus of ureter: Secondary | ICD-10-CM | POA: Diagnosis not present

## 2022-10-06 DIAGNOSIS — Z5111 Encounter for antineoplastic chemotherapy: Secondary | ICD-10-CM | POA: Diagnosis not present

## 2022-10-06 DIAGNOSIS — J90 Pleural effusion, not elsewhere classified: Secondary | ICD-10-CM | POA: Diagnosis not present

## 2022-10-06 DIAGNOSIS — Z87891 Personal history of nicotine dependence: Secondary | ICD-10-CM | POA: Diagnosis not present

## 2022-10-06 DIAGNOSIS — E782 Mixed hyperlipidemia: Secondary | ICD-10-CM | POA: Diagnosis not present

## 2022-10-06 DIAGNOSIS — D72829 Elevated white blood cell count, unspecified: Secondary | ICD-10-CM | POA: Diagnosis not present

## 2022-10-06 DIAGNOSIS — M1711 Unilateral primary osteoarthritis, right knee: Secondary | ICD-10-CM | POA: Diagnosis not present

## 2022-10-06 DIAGNOSIS — R739 Hyperglycemia, unspecified: Secondary | ICD-10-CM | POA: Diagnosis not present

## 2022-10-06 DIAGNOSIS — K449 Diaphragmatic hernia without obstruction or gangrene: Secondary | ICD-10-CM | POA: Diagnosis not present

## 2022-10-06 DIAGNOSIS — Z79899 Other long term (current) drug therapy: Secondary | ICD-10-CM | POA: Diagnosis not present

## 2022-10-06 DIAGNOSIS — D75839 Thrombocytosis, unspecified: Secondary | ICD-10-CM | POA: Diagnosis not present

## 2022-10-06 DIAGNOSIS — Z7952 Long term (current) use of systemic steroids: Secondary | ICD-10-CM | POA: Diagnosis not present

## 2022-10-06 DIAGNOSIS — D649 Anemia, unspecified: Secondary | ICD-10-CM

## 2022-10-06 DIAGNOSIS — R161 Splenomegaly, not elsewhere classified: Secondary | ICD-10-CM | POA: Diagnosis not present

## 2022-10-06 DIAGNOSIS — K219 Gastro-esophageal reflux disease without esophagitis: Secondary | ICD-10-CM | POA: Diagnosis not present

## 2022-10-06 MED ORDER — HEPARIN SOD (PORK) LOCK FLUSH 100 UNIT/ML IV SOLN
500.0000 [IU] | Freq: Once | INTRAVENOUS | Status: AC | PRN
  Administered 2022-10-06: 500 [IU]

## 2022-10-06 MED ORDER — ONDANSETRON HCL 8 MG PO TABS
8.0000 mg | ORAL_TABLET | Freq: Once | ORAL | Status: AC
  Administered 2022-10-06: 8 mg via ORAL
  Filled 2022-10-06: qty 1

## 2022-10-06 MED ORDER — SODIUM CHLORIDE 0.9% FLUSH
10.0000 mL | INTRAVENOUS | Status: DC | PRN
  Administered 2022-10-06: 10 mL

## 2022-10-06 MED ORDER — SODIUM CHLORIDE 0.9 % IV SOLN
75.0000 mg/m2 | Freq: Once | INTRAVENOUS | Status: AC
  Administered 2022-10-06: 140 mg via INTRAVENOUS
  Filled 2022-10-06: qty 14

## 2022-10-06 MED ORDER — SODIUM CHLORIDE 0.9 % IV SOLN: Freq: Once | INTRAVENOUS | Status: AC

## 2022-10-06 NOTE — Patient Instructions (Signed)
[facility]  Discharge Instructions: Thank you for choosing [facility] to provide your oncology and hematology care.   If you have a lab appointment with the Cancer Center, please go directly to the Cancer Center and check in at the registration area.   Wear comfortable clothing and clothing appropriate for easy access to any Portacath or PICC line.   We strive to give you quality time with your provider. You may need to reschedule your appointment if you arrive late (15 or more minutes).  Arriving late affects you and other patients whose appointments are after yours.  Also, if you miss three or more appointments without notifying the office, you may be dismissed from the clinic at the provider's discretion.      For prescription refill requests, have your pharmacy contact our office and allow 72 hours for refills to be completed.    Today you received the following chemotherapy and/or immunotherapy agents: Vidaza      To help prevent nausea and vomiting after your treatment, we encourage you to take your nausea medication as directed.  BELOW ARE SYMPTOMS THAT SHOULD BE REPORTED IMMEDIATELY: *FEVER GREATER THAN 100.4 F (38 C) OR HIGHER *CHILLS OR SWEATING *NAUSEA AND VOMITING THAT IS NOT CONTROLLED WITH YOUR NAUSEA MEDICATION *UNUSUAL SHORTNESS OF BREATH *UNUSUAL BRUISING OR BLEEDING *URINARY PROBLEMS (pain or burning when urinating, or frequent urination) *BOWEL PROBLEMS (unusual diarrhea, constipation, pain near the anus) TENDERNESS IN MOUTH AND THROAT WITH OR WITHOUT PRESENCE OF ULCERS (sore throat, sores in mouth, or a toothache) UNUSUAL RASH, SWELLING OR PAIN  UNUSUAL VAGINAL DISCHARGE OR ITCHING   Items with * indicate a potential emergency and should be followed up as soon as possible or go to the Emergency Department if any problems should occur.  Please show the CHEMOTHERAPY ALERT CARD or IMMUNOTHERAPY ALERT CARD at check-in to the  Emergency Department and triage nurse.  Should you have questions after your visit or need to cancel or reschedule your appointment, please contact [facility]  Dept: [phone]  and follow the prompts.  Office hours are 8:00 a.m. to 4:30 p.m. Monday - Friday. Please note that voicemails left after 4:00 p.m. may not be returned until the following business day.  We are closed weekends and major holidays. You have access to a nurse at all times for urgent questions. Please call the main number to the clinic Dept: [phone] and follow the prompts.   For any non-urgent questions, you may also contact your provider using MyChart. We now offer e-Visits for anyone 18 and older to request care online for non-urgent symptoms. For details visit [email or web address].   Also download the MyChart app! Go to the app store, search "MyChart", open the app, select [facility], and log in with your MyChart username and password.  Masks are optional in the cancer centers. If you would like for your care team to wear a mask while they are taking care of you, please let them know. You may have one support person who is at least 78 years old accompany you for your appointments. 

## 2022-10-07 ENCOUNTER — Other Ambulatory Visit: Payer: Self-pay

## 2022-10-07 ENCOUNTER — Inpatient Hospital Stay: Payer: Medicare Other

## 2022-10-07 VITALS — BP 107/67 | HR 75 | Temp 98.2°F | Resp 17

## 2022-10-07 DIAGNOSIS — M1711 Unilateral primary osteoarthritis, right knee: Secondary | ICD-10-CM | POA: Diagnosis not present

## 2022-10-07 DIAGNOSIS — K219 Gastro-esophageal reflux disease without esophagitis: Secondary | ICD-10-CM | POA: Diagnosis not present

## 2022-10-07 DIAGNOSIS — D469 Myelodysplastic syndrome, unspecified: Secondary | ICD-10-CM | POA: Diagnosis not present

## 2022-10-07 DIAGNOSIS — Z87891 Personal history of nicotine dependence: Secondary | ICD-10-CM | POA: Diagnosis not present

## 2022-10-07 DIAGNOSIS — R161 Splenomegaly, not elsewhere classified: Secondary | ICD-10-CM | POA: Diagnosis not present

## 2022-10-07 DIAGNOSIS — E782 Mixed hyperlipidemia: Secondary | ICD-10-CM | POA: Diagnosis not present

## 2022-10-07 DIAGNOSIS — J90 Pleural effusion, not elsewhere classified: Secondary | ICD-10-CM | POA: Diagnosis not present

## 2022-10-07 DIAGNOSIS — D649 Anemia, unspecified: Secondary | ICD-10-CM

## 2022-10-07 DIAGNOSIS — K5900 Constipation, unspecified: Secondary | ICD-10-CM | POA: Diagnosis not present

## 2022-10-07 DIAGNOSIS — D72829 Elevated white blood cell count, unspecified: Secondary | ICD-10-CM | POA: Diagnosis not present

## 2022-10-07 DIAGNOSIS — K449 Diaphragmatic hernia without obstruction or gangrene: Secondary | ICD-10-CM | POA: Diagnosis not present

## 2022-10-07 DIAGNOSIS — D75839 Thrombocytosis, unspecified: Secondary | ICD-10-CM | POA: Diagnosis not present

## 2022-10-07 DIAGNOSIS — Z79899 Other long term (current) drug therapy: Secondary | ICD-10-CM | POA: Diagnosis not present

## 2022-10-07 DIAGNOSIS — Z5111 Encounter for antineoplastic chemotherapy: Secondary | ICD-10-CM | POA: Diagnosis not present

## 2022-10-07 DIAGNOSIS — N202 Calculus of kidney with calculus of ureter: Secondary | ICD-10-CM | POA: Diagnosis not present

## 2022-10-07 DIAGNOSIS — R739 Hyperglycemia, unspecified: Secondary | ICD-10-CM | POA: Diagnosis not present

## 2022-10-07 DIAGNOSIS — Z7952 Long term (current) use of systemic steroids: Secondary | ICD-10-CM | POA: Diagnosis not present

## 2022-10-07 LAB — CBC WITH DIFFERENTIAL (CANCER CENTER ONLY)
Abs Immature Granulocytes: 0.62 10*3/uL — ABNORMAL HIGH (ref 0.00–0.07)
Basophils Absolute: 0.3 10*3/uL — ABNORMAL HIGH (ref 0.0–0.1)
Basophils Relative: 3 %
Eosinophils Absolute: 0.7 10*3/uL — ABNORMAL HIGH (ref 0.0–0.5)
Eosinophils Relative: 7 %
HCT: 24 % — ABNORMAL LOW (ref 39.0–52.0)
Hemoglobin: 7.9 g/dL — ABNORMAL LOW (ref 13.0–17.0)
Immature Granulocytes: 6 %
Lymphocytes Relative: 4 %
Lymphs Abs: 0.4 10*3/uL — ABNORMAL LOW (ref 0.7–4.0)
MCH: 29 pg (ref 26.0–34.0)
MCHC: 32.9 g/dL (ref 30.0–36.0)
MCV: 88.2 fL (ref 80.0–100.0)
Monocytes Absolute: 0.4 10*3/uL (ref 0.1–1.0)
Monocytes Relative: 4 %
Neutro Abs: 7.9 10*3/uL — ABNORMAL HIGH (ref 1.7–7.7)
Neutrophils Relative %: 76 %
Platelet Count: 643 10*3/uL — ABNORMAL HIGH (ref 150–400)
RBC: 2.72 MIL/uL — ABNORMAL LOW (ref 4.22–5.81)
RDW: 15.9 % — ABNORMAL HIGH (ref 11.5–15.5)
WBC Count: 10.4 10*3/uL (ref 4.0–10.5)
nRBC: 0 % (ref 0.0–0.2)

## 2022-10-07 LAB — CMP (CANCER CENTER ONLY)
ALT: 16 U/L (ref 0–44)
AST: 11 U/L — ABNORMAL LOW (ref 15–41)
Albumin: 3.6 g/dL (ref 3.5–5.0)
Alkaline Phosphatase: 46 U/L (ref 38–126)
Anion gap: 5 (ref 5–15)
BUN: 25 mg/dL — ABNORMAL HIGH (ref 8–23)
CO2: 29 mmol/L (ref 22–32)
Calcium: 8.7 mg/dL — ABNORMAL LOW (ref 8.9–10.3)
Chloride: 103 mmol/L (ref 98–111)
Creatinine: 1.09 mg/dL (ref 0.61–1.24)
GFR, Estimated: >60 mL/min (ref >60)
Glucose, Bld: 171 mg/dL — ABNORMAL HIGH (ref 70–99)
Potassium: 4.4 mmol/L (ref 3.5–5.1)
Sodium: 137 mmol/L (ref 135–145)
Total Bilirubin: 0.6 mg/dL (ref 0.3–1.2)
Total Protein: 5.4 g/dL — ABNORMAL LOW (ref 6.5–8.1)

## 2022-10-07 LAB — SAMPLE TO BLOOD BANK

## 2022-10-07 LAB — PREPARE RBC (CROSSMATCH)

## 2022-10-07 MED ORDER — ONDANSETRON HCL 8 MG PO TABS: 8.0000 mg | ORAL_TABLET | Freq: Once | ORAL | Status: DC

## 2022-10-07 MED ORDER — SODIUM CHLORIDE 0.9% IV SOLUTION: 250.0000 mL | Freq: Once | INTRAVENOUS | Status: DC

## 2022-10-07 MED ORDER — SODIUM CHLORIDE 0.9 % IV SOLN
75.0000 mg/m2 | Freq: Once | INTRAVENOUS | Status: AC
  Administered 2022-10-07: 140 mg via INTRAVENOUS
  Filled 2022-10-07: qty 14

## 2022-10-07 MED ORDER — METHYLPREDNISOLONE SODIUM SUCC 40 MG IJ SOLR
40.0000 mg | Freq: Once | INTRAMUSCULAR | Status: AC
  Administered 2022-10-07: 40 mg via INTRAVENOUS
  Filled 2022-10-07: qty 1

## 2022-10-07 MED ORDER — SODIUM CHLORIDE 0.9% FLUSH
10.0000 mL | INTRAVENOUS | Status: DC | PRN
  Administered 2022-10-07: 10 mL

## 2022-10-07 MED ORDER — ACETAMINOPHEN 325 MG PO TABS
650.0000 mg | ORAL_TABLET | Freq: Once | ORAL | Status: AC
  Administered 2022-10-07: 650 mg via ORAL
  Filled 2022-10-07: qty 2

## 2022-10-07 MED ORDER — HEPARIN SOD (PORK) LOCK FLUSH 100 UNIT/ML IV SOLN
500.0000 [IU] | Freq: Once | INTRAVENOUS | Status: AC | PRN
  Administered 2022-10-07: 500 [IU]

## 2022-10-07 MED ORDER — SODIUM CHLORIDE 0.9% FLUSH
10.0000 mL | INTRAVENOUS | Status: DC | PRN
  Administered 2022-10-07: 10 mL via INTRAVENOUS

## 2022-10-07 MED ORDER — SODIUM CHLORIDE 0.9 % IV SOLN: Freq: Once | INTRAVENOUS | Status: AC

## 2022-10-08 ENCOUNTER — Inpatient Hospital Stay: Payer: Medicare Other

## 2022-10-08 VITALS — BP 109/64 | HR 70 | Temp 98.0°F | Resp 18

## 2022-10-08 DIAGNOSIS — K219 Gastro-esophageal reflux disease without esophagitis: Secondary | ICD-10-CM | POA: Diagnosis not present

## 2022-10-08 DIAGNOSIS — Z5111 Encounter for antineoplastic chemotherapy: Secondary | ICD-10-CM | POA: Diagnosis not present

## 2022-10-08 DIAGNOSIS — R161 Splenomegaly, not elsewhere classified: Secondary | ICD-10-CM | POA: Diagnosis not present

## 2022-10-08 DIAGNOSIS — D469 Myelodysplastic syndrome, unspecified: Secondary | ICD-10-CM | POA: Diagnosis not present

## 2022-10-08 DIAGNOSIS — D649 Anemia, unspecified: Secondary | ICD-10-CM

## 2022-10-08 DIAGNOSIS — M1711 Unilateral primary osteoarthritis, right knee: Secondary | ICD-10-CM | POA: Diagnosis not present

## 2022-10-08 DIAGNOSIS — N202 Calculus of kidney with calculus of ureter: Secondary | ICD-10-CM | POA: Diagnosis not present

## 2022-10-08 DIAGNOSIS — D75839 Thrombocytosis, unspecified: Secondary | ICD-10-CM | POA: Diagnosis not present

## 2022-10-08 DIAGNOSIS — K449 Diaphragmatic hernia without obstruction or gangrene: Secondary | ICD-10-CM | POA: Diagnosis not present

## 2022-10-08 DIAGNOSIS — D72829 Elevated white blood cell count, unspecified: Secondary | ICD-10-CM | POA: Diagnosis not present

## 2022-10-08 DIAGNOSIS — Z7952 Long term (current) use of systemic steroids: Secondary | ICD-10-CM | POA: Diagnosis not present

## 2022-10-08 DIAGNOSIS — J90 Pleural effusion, not elsewhere classified: Secondary | ICD-10-CM | POA: Diagnosis not present

## 2022-10-08 DIAGNOSIS — R739 Hyperglycemia, unspecified: Secondary | ICD-10-CM | POA: Diagnosis not present

## 2022-10-08 DIAGNOSIS — Z87891 Personal history of nicotine dependence: Secondary | ICD-10-CM | POA: Diagnosis not present

## 2022-10-08 DIAGNOSIS — Z79899 Other long term (current) drug therapy: Secondary | ICD-10-CM | POA: Diagnosis not present

## 2022-10-08 DIAGNOSIS — K5900 Constipation, unspecified: Secondary | ICD-10-CM | POA: Diagnosis not present

## 2022-10-08 DIAGNOSIS — E782 Mixed hyperlipidemia: Secondary | ICD-10-CM | POA: Diagnosis not present

## 2022-10-08 LAB — TYPE AND SCREEN
ABO/RH(D): A POS
Antibody Screen: NEGATIVE
Unit division: 0

## 2022-10-08 LAB — BPAM RBC
Blood Product Expiration Date: 202401182359
ISSUE DATE / TIME: 202312211140
Unit Type and Rh: 6200

## 2022-10-08 MED ORDER — ONDANSETRON HCL 8 MG PO TABS: 8.0000 mg | ORAL_TABLET | Freq: Once | ORAL | Status: DC

## 2022-10-08 MED ORDER — SODIUM CHLORIDE 0.9 % IV SOLN
75.0000 mg/m2 | Freq: Once | INTRAVENOUS | Status: AC
  Administered 2022-10-08: 140 mg via INTRAVENOUS
  Filled 2022-10-08: qty 14

## 2022-10-08 MED ORDER — HEPARIN SOD (PORK) LOCK FLUSH 100 UNIT/ML IV SOLN
500.0000 [IU] | Freq: Once | INTRAVENOUS | Status: AC | PRN
  Administered 2022-10-08: 500 [IU]

## 2022-10-08 MED ORDER — SODIUM CHLORIDE 0.9 % IV SOLN: Freq: Once | INTRAVENOUS | Status: AC

## 2022-10-08 MED ORDER — SODIUM CHLORIDE 0.9% FLUSH
10.0000 mL | INTRAVENOUS | Status: DC | PRN
  Administered 2022-10-08: 10 mL

## 2022-10-08 NOTE — Patient Instructions (Signed)
[facility]  Discharge Instructions: Thank you for choosing [facility] to provide your oncology and hematology care.   If you have a lab appointment with the Cancer Center, please go directly to the Cancer Center and check in at the registration area.   Wear comfortable clothing and clothing appropriate for easy access to any Portacath or PICC line.   We strive to give you quality time with your provider. You may need to reschedule your appointment if you arrive late (15 or more minutes).  Arriving late affects you and other patients whose appointments are after yours.  Also, if you miss three or more appointments without notifying the office, you may be dismissed from the clinic at the provider's discretion.      For prescription refill requests, have your pharmacy contact our office and allow 72 hours for refills to be completed.    Today you received the following chemotherapy and/or immunotherapy agents: Vidaza      To help prevent nausea and vomiting after your treatment, we encourage you to take your nausea medication as directed.  BELOW ARE SYMPTOMS THAT SHOULD BE REPORTED IMMEDIATELY: *FEVER GREATER THAN 100.4 F (38 C) OR HIGHER *CHILLS OR SWEATING *NAUSEA AND VOMITING THAT IS NOT CONTROLLED WITH YOUR NAUSEA MEDICATION *UNUSUAL SHORTNESS OF BREATH *UNUSUAL BRUISING OR BLEEDING *URINARY PROBLEMS (pain or burning when urinating, or frequent urination) *BOWEL PROBLEMS (unusual diarrhea, constipation, pain near the anus) TENDERNESS IN MOUTH AND THROAT WITH OR WITHOUT PRESENCE OF ULCERS (sore throat, sores in mouth, or a toothache) UNUSUAL RASH, SWELLING OR PAIN  UNUSUAL VAGINAL DISCHARGE OR ITCHING   Items with * indicate a potential emergency and should be followed up as soon as possible or go to the Emergency Department if any problems should occur.  Please show the CHEMOTHERAPY ALERT CARD or IMMUNOTHERAPY ALERT CARD at check-in to the  Emergency Department and triage nurse.  Should you have questions after your visit or need to cancel or reschedule your appointment, please contact [facility]  Dept: [phone]  and follow the prompts.  Office hours are 8:00 a.m. to 4:30 p.m. Monday - Friday. Please note that voicemails left after 4:00 p.m. may not be returned until the following business day.  We are closed weekends and major holidays. You have access to a nurse at all times for urgent questions. Please call the main number to the clinic Dept: [phone] and follow the prompts.   For any non-urgent questions, you may also contact your provider using MyChart. We now offer e-Visits for anyone 18 and older to request care online for non-urgent symptoms. For details visit [email or web address].   Also download the MyChart app! Go to the app store, search "MyChart", open the app, select [facility], and log in with your MyChart username and password.  Masks are optional in the cancer centers. If you would like for your care team to wear a mask while they are taking care of you, please let them know. You may have one support person who is at least 78 years old accompany you for your appointments. 

## 2022-10-14 ENCOUNTER — Inpatient Hospital Stay: Payer: Medicare Other

## 2022-10-14 ENCOUNTER — Other Ambulatory Visit: Payer: Self-pay

## 2022-10-14 ENCOUNTER — Telehealth: Payer: Self-pay

## 2022-10-14 DIAGNOSIS — E782 Mixed hyperlipidemia: Secondary | ICD-10-CM | POA: Diagnosis not present

## 2022-10-14 DIAGNOSIS — D469 Myelodysplastic syndrome, unspecified: Secondary | ICD-10-CM

## 2022-10-14 DIAGNOSIS — M1711 Unilateral primary osteoarthritis, right knee: Secondary | ICD-10-CM | POA: Diagnosis not present

## 2022-10-14 DIAGNOSIS — D649 Anemia, unspecified: Secondary | ICD-10-CM

## 2022-10-14 DIAGNOSIS — J90 Pleural effusion, not elsewhere classified: Secondary | ICD-10-CM | POA: Diagnosis not present

## 2022-10-14 DIAGNOSIS — Z7952 Long term (current) use of systemic steroids: Secondary | ICD-10-CM | POA: Diagnosis not present

## 2022-10-14 DIAGNOSIS — K5900 Constipation, unspecified: Secondary | ICD-10-CM | POA: Diagnosis not present

## 2022-10-14 DIAGNOSIS — Z5111 Encounter for antineoplastic chemotherapy: Secondary | ICD-10-CM | POA: Diagnosis not present

## 2022-10-14 DIAGNOSIS — N202 Calculus of kidney with calculus of ureter: Secondary | ICD-10-CM | POA: Diagnosis not present

## 2022-10-14 DIAGNOSIS — D75839 Thrombocytosis, unspecified: Secondary | ICD-10-CM | POA: Diagnosis not present

## 2022-10-14 DIAGNOSIS — D72829 Elevated white blood cell count, unspecified: Secondary | ICD-10-CM | POA: Diagnosis not present

## 2022-10-14 DIAGNOSIS — K449 Diaphragmatic hernia without obstruction or gangrene: Secondary | ICD-10-CM | POA: Diagnosis not present

## 2022-10-14 DIAGNOSIS — R739 Hyperglycemia, unspecified: Secondary | ICD-10-CM | POA: Diagnosis not present

## 2022-10-14 DIAGNOSIS — Z79899 Other long term (current) drug therapy: Secondary | ICD-10-CM | POA: Diagnosis not present

## 2022-10-14 DIAGNOSIS — R161 Splenomegaly, not elsewhere classified: Secondary | ICD-10-CM | POA: Diagnosis not present

## 2022-10-14 DIAGNOSIS — Z87891 Personal history of nicotine dependence: Secondary | ICD-10-CM | POA: Diagnosis not present

## 2022-10-14 DIAGNOSIS — K219 Gastro-esophageal reflux disease without esophagitis: Secondary | ICD-10-CM | POA: Diagnosis not present

## 2022-10-14 LAB — CBC WITH DIFFERENTIAL (CANCER CENTER ONLY)
Abs Immature Granulocytes: 0.08 10*3/uL — ABNORMAL HIGH (ref 0.00–0.07)
Basophils Absolute: 0.1 10*3/uL (ref 0.0–0.1)
Basophils Relative: 2 %
Eosinophils Absolute: 0.4 10*3/uL (ref 0.0–0.5)
Eosinophils Relative: 7 %
HCT: 20 % — ABNORMAL LOW (ref 39.0–52.0)
Hemoglobin: 6.6 g/dL — CL (ref 13.0–17.0)
Immature Granulocytes: 1 %
Lymphocytes Relative: 6 %
Lymphs Abs: 0.4 10*3/uL — ABNORMAL LOW (ref 0.7–4.0)
MCH: 28.8 pg (ref 26.0–34.0)
MCHC: 33 g/dL (ref 30.0–36.0)
MCV: 87.3 fL (ref 80.0–100.0)
Monocytes Absolute: 0.3 10*3/uL (ref 0.1–1.0)
Monocytes Relative: 5 %
Neutro Abs: 4.7 10*3/uL (ref 1.7–7.7)
Neutrophils Relative %: 79 %
Platelet Count: 285 10*3/uL (ref 150–400)
RBC: 2.29 MIL/uL — ABNORMAL LOW (ref 4.22–5.81)
RDW: 15.6 % — ABNORMAL HIGH (ref 11.5–15.5)
WBC Count: 6 10*3/uL (ref 4.0–10.5)
nRBC: 0 % (ref 0.0–0.2)

## 2022-10-14 LAB — PREPARE RBC (CROSSMATCH)

## 2022-10-14 MED ORDER — SODIUM CHLORIDE 0.9% IV SOLUTION
250.0000 mL | Freq: Once | INTRAVENOUS | Status: AC
  Administered 2022-10-14: 250 mL via INTRAVENOUS

## 2022-10-14 MED ORDER — HEPARIN SOD (PORK) LOCK FLUSH 100 UNIT/ML IV SOLN
500.0000 [IU] | Freq: Every day | INTRAVENOUS | Status: AC | PRN
  Administered 2022-10-14: 500 [IU]

## 2022-10-14 MED ORDER — METHYLPREDNISOLONE SODIUM SUCC 40 MG IJ SOLR
40.0000 mg | Freq: Once | INTRAMUSCULAR | Status: AC
  Administered 2022-10-14: 40 mg via INTRAVENOUS
  Filled 2022-10-14: qty 1

## 2022-10-14 MED ORDER — SODIUM CHLORIDE 0.9% FLUSH
10.0000 mL | INTRAVENOUS | Status: DC | PRN
  Administered 2022-10-14: 10 mL via INTRAVENOUS

## 2022-10-14 MED ORDER — ACETAMINOPHEN 325 MG PO TABS
650.0000 mg | ORAL_TABLET | Freq: Once | ORAL | Status: AC
  Administered 2022-10-14: 650 mg via ORAL
  Filled 2022-10-14: qty 2

## 2022-10-14 MED ORDER — SODIUM CHLORIDE 0.9% FLUSH
10.0000 mL | INTRAVENOUS | Status: AC | PRN
  Administered 2022-10-14: 10 mL

## 2022-10-14 NOTE — Telephone Encounter (Signed)
CRITICAL VALUE STICKER  CRITICAL VALUE:   Hgb 6.6  RECEIVER (on-site recipient of call):  Bonnita Nasuti, [place] NOTIFIED: 09:25  MESSENGER (representative from lab):  Verdis Frederickson  MD NOTIFIED:   Irene Limbo  TIME OF NOTIFICATION:  09:25

## 2022-10-14 NOTE — Patient Instructions (Signed)

## 2022-10-15 LAB — TYPE AND SCREEN
ABO/RH(D): A POS
Antibody Screen: NEGATIVE
Unit division: 0
Unit division: 0

## 2022-10-15 LAB — BPAM RBC
Blood Product Expiration Date: 202401182359
Blood Product Expiration Date: 202401202359
ISSUE DATE / TIME: 202312281030
ISSUE DATE / TIME: 202312281030
Unit Type and Rh: 6200
Unit Type and Rh: 6200

## 2022-10-19 ENCOUNTER — Ambulatory Visit: Payer: Medicare Other | Admitting: Hematology

## 2022-10-20 ENCOUNTER — Other Ambulatory Visit: Payer: Self-pay

## 2022-10-20 DIAGNOSIS — D469 Myelodysplastic syndrome, unspecified: Secondary | ICD-10-CM

## 2022-10-21 ENCOUNTER — Other Ambulatory Visit: Payer: Medicare Other

## 2022-10-21 ENCOUNTER — Other Ambulatory Visit: Payer: Self-pay

## 2022-10-21 DIAGNOSIS — D469 Myelodysplastic syndrome, unspecified: Secondary | ICD-10-CM

## 2022-10-22 ENCOUNTER — Other Ambulatory Visit: Payer: Self-pay

## 2022-10-22 ENCOUNTER — Inpatient Hospital Stay: Payer: Medicare Other

## 2022-10-22 ENCOUNTER — Other Ambulatory Visit: Payer: Self-pay | Admitting: Medical Oncology

## 2022-10-22 ENCOUNTER — Inpatient Hospital Stay: Payer: Medicare Other | Attending: Hematology | Admitting: Hematology

## 2022-10-22 VITALS — BP 108/63 | HR 77 | Temp 97.9°F | Resp 17 | Wt 165.5 lb

## 2022-10-22 DIAGNOSIS — D469 Myelodysplastic syndrome, unspecified: Secondary | ICD-10-CM

## 2022-10-22 DIAGNOSIS — Z86010 Personal history of colonic polyps: Secondary | ICD-10-CM | POA: Diagnosis not present

## 2022-10-22 DIAGNOSIS — E785 Hyperlipidemia, unspecified: Secondary | ICD-10-CM | POA: Insufficient documentation

## 2022-10-22 DIAGNOSIS — M542 Cervicalgia: Secondary | ICD-10-CM | POA: Diagnosis not present

## 2022-10-22 DIAGNOSIS — Z79899 Other long term (current) drug therapy: Secondary | ICD-10-CM | POA: Diagnosis not present

## 2022-10-22 DIAGNOSIS — Z87891 Personal history of nicotine dependence: Secondary | ICD-10-CM | POA: Insufficient documentation

## 2022-10-22 DIAGNOSIS — D649 Anemia, unspecified: Secondary | ICD-10-CM | POA: Diagnosis not present

## 2022-10-22 DIAGNOSIS — K219 Gastro-esophageal reflux disease without esophagitis: Secondary | ICD-10-CM | POA: Diagnosis not present

## 2022-10-22 LAB — CMP (CANCER CENTER ONLY)
ALT: 11 U/L (ref 0–44)
AST: 9 U/L — ABNORMAL LOW (ref 15–41)
Albumin: 3.8 g/dL (ref 3.5–5.0)
Alkaline Phosphatase: 51 U/L (ref 38–126)
Anion gap: 4 — ABNORMAL LOW (ref 5–15)
BUN: 18 mg/dL (ref 8–23)
CO2: 27 mmol/L (ref 22–32)
Calcium: 8.7 mg/dL — ABNORMAL LOW (ref 8.9–10.3)
Chloride: 104 mmol/L (ref 98–111)
Creatinine: 0.85 mg/dL (ref 0.61–1.24)
GFR, Estimated: >60 mL/min (ref >60)
Glucose, Bld: 99 mg/dL (ref 70–99)
Potassium: 4.3 mmol/L (ref 3.5–5.1)
Sodium: 135 mmol/L (ref 135–145)
Total Bilirubin: 0.5 mg/dL (ref 0.3–1.2)
Total Protein: 5.3 g/dL — ABNORMAL LOW (ref 6.5–8.1)

## 2022-10-22 LAB — CBC WITH DIFFERENTIAL (CANCER CENTER ONLY)
Abs Immature Granulocytes: 0.08 10*3/uL — ABNORMAL HIGH (ref 0.00–0.07)
Basophils Absolute: 0.1 10*3/uL (ref 0.0–0.1)
Basophils Relative: 2 %
Eosinophils Absolute: 0.3 10*3/uL (ref 0.0–0.5)
Eosinophils Relative: 6 %
HCT: 19.6 % — ABNORMAL LOW (ref 39.0–52.0)
Hemoglobin: 6.5 g/dL — CL (ref 13.0–17.0)
Immature Granulocytes: 2 %
Lymphocytes Relative: 12 %
Lymphs Abs: 0.6 10*3/uL — ABNORMAL LOW (ref 0.7–4.0)
MCH: 29 pg (ref 26.0–34.0)
MCHC: 33.2 g/dL (ref 30.0–36.0)
MCV: 87.5 fL (ref 80.0–100.0)
Monocytes Absolute: 0.2 10*3/uL (ref 0.1–1.0)
Monocytes Relative: 4 %
Neutro Abs: 3.8 10*3/uL (ref 1.7–7.7)
Neutrophils Relative %: 74 %
Platelet Count: 678 10*3/uL — ABNORMAL HIGH (ref 150–400)
RBC: 2.24 MIL/uL — ABNORMAL LOW (ref 4.22–5.81)
RDW: 15.7 % — ABNORMAL HIGH (ref 11.5–15.5)
WBC Count: 5 10*3/uL (ref 4.0–10.5)
nRBC: 0 % (ref 0.0–0.2)

## 2022-10-22 LAB — SAMPLE TO BLOOD BANK

## 2022-10-22 LAB — PREPARE RBC (CROSSMATCH)

## 2022-10-22 MED ORDER — HEPARIN SOD (PORK) LOCK FLUSH 100 UNIT/ML IV SOLN
500.0000 [IU] | Freq: Every day | INTRAVENOUS | Status: AC | PRN
  Administered 2022-10-22: 500 [IU]

## 2022-10-22 MED ORDER — SODIUM CHLORIDE 0.9% FLUSH
10.0000 mL | Freq: Once | INTRAVENOUS | Status: AC
  Administered 2022-10-22: 10 mL

## 2022-10-22 MED ORDER — ACETAMINOPHEN 325 MG PO TABS
650.0000 mg | ORAL_TABLET | Freq: Once | ORAL | Status: AC
  Administered 2022-10-22: 650 mg via ORAL
  Filled 2022-10-22: qty 2

## 2022-10-22 MED ORDER — SODIUM CHLORIDE 0.9% FLUSH
10.0000 mL | INTRAVENOUS | Status: AC | PRN
  Administered 2022-10-22: 10 mL

## 2022-10-22 MED ORDER — METHYLPREDNISOLONE SODIUM SUCC 40 MG IJ SOLR
40.0000 mg | Freq: Once | INTRAMUSCULAR | Status: AC
  Administered 2022-10-22: 40 mg via INTRAVENOUS
  Filled 2022-10-22: qty 1

## 2022-10-22 MED ORDER — SODIUM CHLORIDE 0.9% IV SOLUTION
250.0000 mL | Freq: Once | INTRAVENOUS | Status: AC
  Administered 2022-10-22: 250 mL via INTRAVENOUS

## 2022-10-22 NOTE — Progress Notes (Signed)
HEMATOLOGY/ONCOLOGY PROGRESS NOTE:   Date of Service: 10/22/22  Patient Care Team: Lurline Del, DO as PCP - General (Family Medicine)  CHIEF COMPLAINTS:  Follow-up for continued evaluation and management of JAK2 positive myeloproliferative neoplasm/MDS  INTERVAL HISTORY:  [name] is a 77 y.o. male here for continued evaluation and management of his MPN/MDS with refractory anemia thrombocytosis. He is here for toxicity check after cycle 1 of Vidaza.  Patient was last seen seen by me on 09/24/22 and reported nausea, fatigue, constipation, and sudden LUQ pain.  Today, he is accompanied by his wife.He will be starting his 3rd cycle on January 15th, 2024. He has had more energy recently.  He tolerated his last cycle with no nausea, diarrhea, or skin rashes.  He does not have any major spleen discomfort, although When sleeping on left side, there is some spleen tenderness.  He has experienced some constipation soon after treatment. He restarted Metamucil and a tablespoon of mineral oil before bedtime, which have helped.  When standing up to quickly he occasionally experienced lightheadedness. He sometimes controls stomach sickness with Compasine.  No skin rash or tingling/numbness in hands/feet. No infection issues.  He previously had shingles in his 43s.  His lymph nodes seem to have shrunk. He has some swelling in ankles, which improves with transfusions.  He takes Alfuzosin, which has improved his frequent urination. He typically sleeps 2-3 hours a night .    MEDICAL HISTORY:  Past Medical History:  Diagnosis Date   Arthritis of right knee    Cervicalgia    Chest discomfort    normal stress echo   Chest pain    Colon polyps    Dyslipidemia    Fluttering sensation of heart    GERD without esophagitis    Hematochezia    Hyperglycemia    Hyperlipidemia    Internal hemorrhoids    Kidney stones    Male erectile dysfunction, unspecified    Mixed  dyslipidemia    Mixed hyperlipidemia    Ocular migraine    PAC (premature atrial contraction)    Personal history of colonic polyps    Prediabetes    Residual hemorrhoidal skin tags    Spleen enlarged    nov 2023 hospitalized   Unspecified hemorrhoids     SURGICAL HISTORY: Past Surgical History:  Procedure Laterality Date   BONE MARROW BIOPSY     IR IMAGING GUIDED PORT INSERTION  09/14/2022   KIDNEY STONE SURGERY     retrieval    SOCIAL HISTORY: Social History   Socioeconomic History   Marital status: Married    Spouse name: Not on file   Number of children: Not on file   Years of education: Not on file   Highest education level: Not on file  Occupational History   Not on file  Tobacco Use   Smoking status: Former    Types: Cigarettes   Smokeless tobacco: Never  Substance and Sexual Activity   Alcohol use: Yes    Alcohol/week: 1.0 standard drink of alcohol    Types: 1 Cans of beer per week   Drug use: Not on file   Sexual activity: Not on file  Other Topics Concern   Not on file  Social History Narrative   Not on file   Social Determinants of Health   Financial Resource Strain: Not on file  Food Insecurity: Unknown (08/31/2022)   Hunger Vital Sign    Worried About Running Out of Food in the  Last Year: Never true    [place] in the Last Year: Not on file  Transportation Needs: No Transportation Needs (08/31/2022)   PRAPARE - Hydrologist (Medical): No    Lack of Transportation (Non-Medical): No  Physical Activity: Not on file  Stress: Not on file  Social Connections: Not on file  Intimate Partner Violence: Not on file    FAMILY HISTORY: Family History  Problem Relation Age of Onset   Stroke Brother    Congestive Heart Failure Brother     ALLERGIES:  has No Known Allergies.  MEDICATIONS:  Current Outpatient Medications  Medication Sig Dispense Refill   ALFUZOSIN HCL ER PO      aspirin EC 81 MG tablet Take 81  mg by mouth daily. Swallow whole.     B Complex Vitamins (B COMPLEX PO) Take 1 tablet by mouth daily.     Cholecalciferol (VITAMIN D3) 50 MCG (2000 UT) TABS Take 2,000 Units by mouth daily.     docusate sodium (COLACE) 100 MG capsule Take 100 mg by mouth 2 (two) times daily.     FIBER PO Take 1 capsule by mouth daily. Unknown strength     fish oil-omega-3 fatty acids 1000 MG capsule Take 1 g by mouth 2 (two) times daily. Strength '300mg'$ /'1500mg'$      Flaxseed, Linseed, (FLAXSEED OIL) 1000 MG CAPS Take 1,000 mg by mouth daily.     GLUCOSAMINE CHONDROITIN MSM PO Take 1 tablet by mouth daily. Strength 1500/1500     lidocaine-prilocaine (EMLA) cream Apply to affected area once 30 g 3   Misc Natural Products (PROSTATE SUPPORT PO) Take 1 capsule by mouth daily. Unknown strength     Multiple Vitamin (MULTIVITAMIN) tablet Take 1 tablet by mouth daily. Unknown strength     ondansetron (ZOFRAN) 8 MG tablet Take 1 tablet (8 mg total) by mouth every 8 (eight) hours as needed for nausea or vomiting. 30 tablet 1   predniSONE (DELTASONE) 10 MG tablet 4 tabs daily for 4 days 3 tabs daily for 4 days 2 tabs daily for 4 days 1 tab daily for 4 days 40 tablet 0   prochlorperazine (COMPAZINE) 10 MG tablet Take 1 tablet (10 mg total) by mouth every 6 (six) hours as needed for nausea or vomiting. 30 tablet 1   psyllium (METAMUCIL) 58.6 % powder Take 1 packet by mouth daily.     senna-docusate (SENNA S) 8.6-50 MG tablet Take 2 tablets by mouth at bedtime. 60 tablet 1   Turmeric (QC TUMERIC COMPLEX PO) Take 750 mg by mouth daily.     vitamin C (ASCORBIC ACID) 250 MG tablet Take 500 mg by mouth daily.     No current facility-administered medications for this visit.    REVIEW OF SYSTEMS:   10 Point review of Systems was done is negative except as noted above.   PHYSICAL EXAMINATION: .There were no vitals taken for this visit. NAD GENERAL:alert, in no acute distress and comfortable SKIN: no acute rashes, no significant  lesions EYES: conjunctiva are pink and non-injected, sclera anicteric NECK: supple, no JVD LYMPH:  no palpable lymphadenopathy in the cervical, axillary or inguinal regions LUNGS: clear to auscultation b/l with normal respiratory effort HEART: regular rate & rhythm ABDOMEN:  normoactive bowel sounds , non tender, not distended. Splenomegaly  Extremity: no pedal edema PSYCH: alert & oriented x 3 with fluent speech NEURO: no focal motor/sensory deficits  LABORATORY DATA:  I have reviewed the  data as listed  .    Latest Ref Rng & Units 10/14/2022    8:52 AM 10/07/2022    8:56 AM 10/04/2022    8:41 AM  CBC  WBC 4.0 - 10.5 K/uL 6.0  10.4  10.6   Hemoglobin 13.0 - 17.0 g/dL 6.6  7.9  8.4   Hematocrit 39.0 - 52.0 % 20.0  24.0  25.2   Platelets 150 - 400 K/uL 285  643  651    Iron/TIBC/Ferritin/ %Sat    Component Value Date/Time   IRON 180 09/30/2022 0924   TIBC 193 (L) 09/30/2022 0924   FERRITIN 1,450 (H) 09/30/2022 0924   IRONPCTSAT 93 (H) 09/30/2022 0924  .    Latest Ref Rng & Units 10/07/2022    8:56 AM 10/04/2022    8:44 AM 09/23/2022    8:45 AM  CMP  Glucose 70 - 99 mg/dL 171  173  249   BUN 8 - 23 mg/dL '25  27  17   '$ Creatinine 0.61 - 1.24 mg/dL 1.09  1.02  0.93   Sodium 135 - 145 mmol/L 137  135  134   Potassium 3.5 - 5.1 mmol/L 4.4  4.4  4.2   Chloride 98 - 111 mmol/L 103  102  102   CO2 22 - 32 mmol/L '29  28  23   '$ Calcium 8.9 - 10.3 mg/dL 8.7  8.8  8.6   Total Protein 6.5 - 8.1 g/dL 5.4  5.3  5.6   Total Bilirubin 0.3 - 1.2 mg/dL 0.6  0.7  1.3   Alkaline Phos 38 - 126 U/L 46  49  60   AST 15 - 41 U/L '11  10  10   '$ ALT 0 - 44 U/L '16  17  10    '$ . Lab Results  Component Value Date   LDH 110 01/04/2022    02/23/2021 BCR ABL    02/23/2021 JAK2   Surgical Pathology  CASE: [id number]  PATIENT: Labib Threat  Bone Marrow Report      Clinical History: MPN with progressive anemia  (BH)  DIAGNOSIS:   BONE MARROW, ASPIRATE, CLOT, CORE:  -Hypercellular  bone marrow with features of myeloid neoplasm  -See comment   PERIPHERAL BLOOD:  -Macrocytic anemia  -Leukocytosis  -Thrombocytosis   COMMENT:   The bone marrow/peripheral blood show persistent involvement by  previously known myeloid neoplasm.  There is a myeloproliferative  component as supported by previous JAK2 positivity.  However, there are  also dyspoietic changes primarily involving the megakaryocytic cell line  with numerous hypolobated/unilobated forms in addition to  dysgranulopoiesis to a lesser extent.  This is associated with  eosinophilia.  It is not entirely clear whether the overall findings  represent a myeloproliferative neoplasm with treatment related changes  or represent a primary myeloproliferative/myelodysplastic neoplasm  including but not limited to myeloid neoplasms with eosinophilia.  Correlation with cytogenetic and FISH studies strongly recommended.      RADIOGRAPHIC STUDIES: I have personally reviewed the radiological images as listed and agreed with the findings in the report. No results found.   IR IMAGING GUIDED PORT INSERTION  Result Date: 09/14/2022 INDICATION: Poor IV access.  Myelodysplastic syndrome EXAM: IMPLANTED PORT A CATH PLACEMENT WITH ULTRASOUND AND FLUOROSCOPIC GUIDANCE MEDICATIONS: None ANESTHESIA/SEDATION: Moderate (conscious) sedation was employed during this procedure. A total of Versed 3 mg and Fentanyl 100 mcg was administered intravenously. Moderate Sedation Time: 20 minutes. The patient's level of consciousness and vital signs were monitored continuously  by radiology nursing throughout the procedure under my direct supervision. FLUOROSCOPY TIME:  Fluoroscopic dose; 1 mGy COMPLICATIONS: None immediate. PROCEDURE: The procedure, risks, benefits, and alternatives were explained to the patient. Questions regarding the procedure were encouraged and answered. The patient understands and consents to the procedure. The RIGHT neck and  chest were prepped with chlorhexidine in a sterile fashion, and a sterile drape was applied covering the operative field. Maximum barrier sterile technique with sterile gowns and gloves were used for the procedure. A timeout was performed prior to the initiation of the procedure. Local anesthesia was provided with 1% lidocaine with epinephrine. After creating a small venotomy incision, a micropuncture kit was utilized to access the internal jugular vein under direct, real-time ultrasound guidance. Ultrasound image documentation was performed. The microwire was kinked to measure appropriate catheter length. A subcutaneous port pocket was then created along the upper chest wall utilizing a combination of sharp and blunt dissection. The pocket was irrigated with sterile saline. A single lumen ISP power injectable port was chosen for placement. The 8 Fr catheter was tunneled from the port pocket site to the venotomy incision. The port was placed in the pocket. The external catheter was trimmed to appropriate length. At the venotomy, an 8 Fr peel-away sheath was placed over a guidewire under fluoroscopic guidance. The catheter was then placed through the sheath and the sheath was removed. Final catheter positioning was confirmed and documented with a fluoroscopic spot radiograph. The port was accessed with a Huber needle, aspirated and flushed with heparinized saline. The port pocket incision was closed with interrupted 3-0 Vicryl suture then Dermabond was applied, including at the venotomy incision. Dressings were placed. The patient tolerated the procedure well without immediate post procedural complication. IMPRESSION: Successful placement of a RIGHT internal jugular approach power injectable Port-A-Cath. The tip of the catheter is positioned within the proximal RIGHT atrium. The catheter is ready for immediate use. Michaelle Birks, MD [facility] Radiology Electronically  Signed   By: Michaelle Birks M.D.   On: 09/14/2022 17:18   CT ANGIO GI BLEED  Result Date: 08/31/2022 CLINICAL DATA:  Acute mesenteric ischemia. Abdominal pain. History of myelodysplastic syndrome. EXAM: CTA ABDOMEN AND PELVIS WITHOUT AND WITH CONTRAST TECHNIQUE: Multidetector CT imaging of the abdomen and pelvis was performed using the standard protocol during bolus administration of intravenous contrast. Multiplanar reconstructed images and MIPs were obtained and reviewed to evaluate the vascular anatomy. RADIATION DOSE REDUCTION: This exam was performed according to the departmental dose-optimization program which includes automated exposure control, adjustment of the mA and/or kV according to patient size and/or use of iterative reconstruction technique. CONTRAST:  155m OMNIPAQUE IOHEXOL 350 MG/ML SOLN COMPARISON:  Abdominal ultrasound examination 04/03/2021 FINDINGS: VASCULAR Aorta: Normal caliber abdominal aorta. Scattered atherosclerotic calcifications. No dissection. Celiac: Normal SMA: Normal Renals: Normal IMA: Patent Inflow: Normal Proximal Outflow: Normal Veins: Normal Review of the MIP images confirms the above findings. NON-VASCULAR Lower chest: Small left pleural effusion and bibasilar atelectasis. The heart is normal in size. No pericardial effusion. There is a moderate to large hiatal hernia. Hepatobiliary: No hepatic lesions or intrahepatic biliary dilatation. The gallbladder is unremarkable. No common bile duct dilatation. Pancreas: No mass, inflammation or ductal dilatation. Spleen: Massive splenomegaly. The spleen measures 20 x 16 x 11 cm. No splenic lesions or splenic infarct. Adrenals/Urinary Tract: The left kidney is displaced medially and inferiorly by the enlarged spleen. No worrisome renal lesions,, hydronephrosis or pyelonephritis. There is a lower pole  right renal calculus and there are 2 distal left ureteral calculi more proximal calculus measures 4 mm and the more distal calculus  measures 5 mm. I do not see any hydroureter or hydronephrosis. Stomach/Bowel: The stomach, duodenum, small bowel and colon are grossly normal. No inflammatory changes, mass lesions or obstructive findings. Lymphatic: No abdominal or pelvic lymphadenopathy. Reproductive: The prostate gland is enlarged. The seminal vesicles are unremarkable. Other: No pelvic mass or adenopathy. No free pelvic fluid collections. No inguinal mass or adenopathy. No abdominal wall hernia or subcutaneous lesions. Musculoskeletal: No significant bony findings. IMPRESSION: 1. Normal caliber abdominal aorta and no dissection. The branch vessels are normal. 2. Splenomegaly. 3. Two distal left ureteral calculi but no hydroureter or hydronephrosis. 4. Lower pole right renal calculus. 5. Moderate to large hiatal hernia. 6. Small left pleural effusion and bibasilar atelectasis. Electronically Signed   By: Marijo Sanes M.D.   On: 08/31/2022 18:13     ASSESSMENT & PLAN:    #1 JAK2-positive myeloproliferative neoplasm-primarily presenting with thrombocytosis and associated MDS causing refractory anemia -No polycythemia.  -Mild leukocytosis.  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 Has grade 1 out of 3 reticulin fibrosis.  Uncertain if this is primary or secondary. -LDH has remained within normal limits. 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 -CT Angio GI bleed scan from 09/01/2022 showed kidney stones.and significant splenomegaly  PLAN:  -discussed labs from 10/22/22 with patient and his wife. CBC showed WBC of 5.0 K, hemoglobin of 6.5 K, and platelets of 678 K. Platelets normal but slightly dropping. Platelets controled well with Vidaza. CMP stable. -Educated patient on iron chelating medication -recommended Miralax or Metamucil as needed for constipation -scan spleen and reevaluate bone marrow after 4 cycles -do not recommend diuretic for ankle swelling at this point -recommended  patient to drink more water during the day as opposed to night time to hep with his frequent urination. -recommend using compression socks and moderate salt intake to improve leg swelling.  -recommend patient to receive an RSV, influenza, shinglex, or COVID-19 vaccinations for his bone marrow disorder. -answered all of patient's and his wife's questions  Follow-up: ***  The total time spent in the appointment was *** minutes* .  All of the patient's questions were answered with apparent satisfaction. The patient knows to call the clinic with any problems, questions or concerns.   Sullivan Lone MD MS AAHIVMS [facility] Hematology/Oncology Physician [facility]  .*Total Encounter Time as defined by the Centers for Medicare and Medicaid Services includes, in addition to the face-to-face time of a patient visit (documented in the note above) non-face-to-face time: obtaining and reviewing outside history, ordering and reviewing medications, tests or procedures, care coordination (communications with other health care professionals or caregivers) and documentation in the medical record.   I,Mitra Faeizi,acting as a Education administrator for Sullivan Lone, MD.,have documented all relevant documentation on the behalf of Sullivan Lone, MD,as directed by  Sullivan Lone, MD while in the presence of Sullivan Lone, MD.  ***

## 2022-10-22 NOTE — Patient Instructions (Signed)

## 2022-10-22 NOTE — Progress Notes (Signed)
Patient seen by MD today  Vitals are within treatment parameters.  Labs reviewed: ,"and are not all within treatment parameters. Dr Irene Limbo aware pt's HGB is 6.5. Pt to get 2 Units of PRBC's  (may run at 300/hr)   Per physician team, patient is ready for treatment. Please note that modifications are being made to the treatment plan including May run blood at 300/hr

## 2022-10-23 LAB — TYPE AND SCREEN
ABO/RH(D): A POS
Antibody Screen: NEGATIVE
Unit division: 0

## 2022-10-23 LAB — BPAM RBC
Blood Product Expiration Date: 202401292359
ISSUE DATE / TIME: 202401051431
Unit Type and Rh: 6200

## 2022-10-25 DIAGNOSIS — D469 Myelodysplastic syndrome, unspecified: Secondary | ICD-10-CM | POA: Diagnosis not present

## 2022-10-27 ENCOUNTER — Other Ambulatory Visit: Payer: Self-pay

## 2022-10-27 DIAGNOSIS — D469 Myelodysplastic syndrome, unspecified: Secondary | ICD-10-CM

## 2022-10-28 ENCOUNTER — Inpatient Hospital Stay: Payer: Medicare Other

## 2022-10-28 ENCOUNTER — Encounter: Payer: Self-pay | Admitting: Hematology

## 2022-10-28 DIAGNOSIS — M542 Cervicalgia: Secondary | ICD-10-CM | POA: Diagnosis not present

## 2022-10-28 DIAGNOSIS — E785 Hyperlipidemia, unspecified: Secondary | ICD-10-CM | POA: Diagnosis not present

## 2022-10-28 DIAGNOSIS — D469 Myelodysplastic syndrome, unspecified: Secondary | ICD-10-CM | POA: Diagnosis not present

## 2022-10-28 DIAGNOSIS — K219 Gastro-esophageal reflux disease without esophagitis: Secondary | ICD-10-CM | POA: Diagnosis not present

## 2022-10-28 DIAGNOSIS — Z79899 Other long term (current) drug therapy: Secondary | ICD-10-CM | POA: Diagnosis not present

## 2022-10-28 DIAGNOSIS — Z87891 Personal history of nicotine dependence: Secondary | ICD-10-CM | POA: Diagnosis not present

## 2022-10-28 DIAGNOSIS — Z86010 Personal history of colonic polyps: Secondary | ICD-10-CM | POA: Diagnosis not present

## 2022-10-28 LAB — CMP (CANCER CENTER ONLY)
ALT: 15 U/L (ref 0–44)
AST: 10 U/L — ABNORMAL LOW (ref 15–41)
Albumin: 3.7 g/dL (ref 3.5–5.0)
Alkaline Phosphatase: 53 U/L (ref 38–126)
Anion gap: 5 (ref 5–15)
BUN: 19 mg/dL (ref 8–23)
CO2: 27 mmol/L (ref 22–32)
Calcium: 8.7 mg/dL — ABNORMAL LOW (ref 8.9–10.3)
Chloride: 107 mmol/L (ref 98–111)
Creatinine: 0.92 mg/dL (ref 0.61–1.24)
GFR, Estimated: >60 mL/min (ref >60)
Glucose, Bld: 119 mg/dL — ABNORMAL HIGH (ref 70–99)
Potassium: 4.4 mmol/L (ref 3.5–5.1)
Sodium: 139 mmol/L (ref 135–145)
Total Bilirubin: 0.6 mg/dL (ref 0.3–1.2)
Total Protein: 5.6 g/dL — ABNORMAL LOW (ref 6.5–8.1)

## 2022-10-28 LAB — CBC WITH DIFFERENTIAL (CANCER CENTER ONLY)
Abs Immature Granulocytes: 0.19 10*3/uL — ABNORMAL HIGH (ref 0.00–0.07)
Basophils Absolute: 0.3 10*3/uL — ABNORMAL HIGH (ref 0.0–0.1)
Basophils Relative: 6 %
Eosinophils Absolute: 0.2 10*3/uL (ref 0.0–0.5)
Eosinophils Relative: 5 %
HCT: 23.2 % — ABNORMAL LOW (ref 39.0–52.0)
Hemoglobin: 7.8 g/dL — ABNORMAL LOW (ref 13.0–17.0)
Immature Granulocytes: 4 %
Lymphocytes Relative: 13 %
Lymphs Abs: 0.6 10*3/uL — ABNORMAL LOW (ref 0.7–4.0)
MCH: 28.8 pg (ref 26.0–34.0)
MCHC: 33.6 g/dL (ref 30.0–36.0)
MCV: 85.6 fL (ref 80.0–100.0)
Monocytes Absolute: 0.3 10*3/uL (ref 0.1–1.0)
Monocytes Relative: 7 %
Neutro Abs: 3.1 10*3/uL (ref 1.7–7.7)
Neutrophils Relative %: 65 %
Platelet Count: 756 10*3/uL — ABNORMAL HIGH (ref 150–400)
RBC: 2.71 MIL/uL — ABNORMAL LOW (ref 4.22–5.81)
RDW: 16 % — ABNORMAL HIGH (ref 11.5–15.5)
WBC Count: 4.7 10*3/uL (ref 4.0–10.5)
nRBC: 0 % (ref 0.0–0.2)

## 2022-10-28 LAB — SAMPLE TO BLOOD BANK

## 2022-10-28 MED ORDER — SODIUM CHLORIDE 0.9% FLUSH
10.0000 mL | Freq: Once | INTRAVENOUS | Status: AC
  Administered 2022-10-28: 10 mL

## 2022-10-28 NOTE — Progress Notes (Unsigned)
Per Dr Irene Limbo patient will not receive blood today.

## 2022-11-01 ENCOUNTER — Other Ambulatory Visit: Payer: Self-pay

## 2022-11-01 ENCOUNTER — Inpatient Hospital Stay: Payer: Medicare Other | Admitting: Hematology

## 2022-11-01 ENCOUNTER — Inpatient Hospital Stay: Payer: Medicare Other

## 2022-11-01 VITALS — BP 97/55 | HR 67 | Temp 97.7°F | Resp 16

## 2022-11-01 DIAGNOSIS — D469 Myelodysplastic syndrome, unspecified: Secondary | ICD-10-CM | POA: Diagnosis not present

## 2022-11-01 DIAGNOSIS — Z87891 Personal history of nicotine dependence: Secondary | ICD-10-CM | POA: Diagnosis not present

## 2022-11-01 DIAGNOSIS — D649 Anemia, unspecified: Secondary | ICD-10-CM

## 2022-11-01 DIAGNOSIS — Z86010 Personal history of colonic polyps: Secondary | ICD-10-CM | POA: Diagnosis not present

## 2022-11-01 DIAGNOSIS — M542 Cervicalgia: Secondary | ICD-10-CM | POA: Diagnosis not present

## 2022-11-01 DIAGNOSIS — Z79899 Other long term (current) drug therapy: Secondary | ICD-10-CM | POA: Diagnosis not present

## 2022-11-01 DIAGNOSIS — E785 Hyperlipidemia, unspecified: Secondary | ICD-10-CM | POA: Diagnosis not present

## 2022-11-01 DIAGNOSIS — K219 Gastro-esophageal reflux disease without esophagitis: Secondary | ICD-10-CM | POA: Diagnosis not present

## 2022-11-01 DIAGNOSIS — Z95828 Presence of other vascular implants and grafts: Secondary | ICD-10-CM

## 2022-11-01 LAB — CMP (CANCER CENTER ONLY)
ALT: 16 U/L (ref 0–44)
AST: 12 U/L — ABNORMAL LOW (ref 15–41)
Albumin: 3.6 g/dL (ref 3.5–5.0)
Alkaline Phosphatase: 73 U/L (ref 38–126)
Anion gap: 3 — ABNORMAL LOW (ref 5–15)
BUN: 21 mg/dL (ref 8–23)
CO2: 28 mmol/L (ref 22–32)
Calcium: 9 mg/dL (ref 8.9–10.3)
Chloride: 105 mmol/L (ref 98–111)
Creatinine: 0.81 mg/dL (ref 0.61–1.24)
GFR, Estimated: >60 mL/min (ref >60)
Glucose, Bld: 153 mg/dL — ABNORMAL HIGH (ref 70–99)
Potassium: 4.5 mmol/L (ref 3.5–5.1)
Sodium: 136 mmol/L (ref 135–145)
Total Bilirubin: 0.6 mg/dL (ref 0.3–1.2)
Total Protein: 5.5 g/dL — ABNORMAL LOW (ref 6.5–8.1)

## 2022-11-01 LAB — CBC WITH DIFFERENTIAL (CANCER CENTER ONLY)
Abs Immature Granulocytes: 0.25 10*3/uL — ABNORMAL HIGH (ref 0.00–0.07)
Basophils Absolute: 0.2 10*3/uL — ABNORMAL HIGH (ref 0.0–0.1)
Basophils Relative: 5 %
Eosinophils Absolute: 0.4 10*3/uL (ref 0.0–0.5)
Eosinophils Relative: 8 %
HCT: 20.9 % — ABNORMAL LOW (ref 39.0–52.0)
Hemoglobin: 6.8 g/dL — CL (ref 13.0–17.0)
Immature Granulocytes: 6 %
Lymphocytes Relative: 13 %
Lymphs Abs: 0.6 10*3/uL — ABNORMAL LOW (ref 0.7–4.0)
MCH: 28.3 pg (ref 26.0–34.0)
MCHC: 32.5 g/dL (ref 30.0–36.0)
MCV: 87.1 fL (ref 80.0–100.0)
Monocytes Absolute: 0.3 10*3/uL (ref 0.1–1.0)
Monocytes Relative: 6 %
Neutro Abs: 2.8 10*3/uL (ref 1.7–7.7)
Neutrophils Relative %: 62 %
Platelet Count: 631 10*3/uL — ABNORMAL HIGH (ref 150–400)
RBC: 2.4 MIL/uL — ABNORMAL LOW (ref 4.22–5.81)
RDW: 15.7 % — ABNORMAL HIGH (ref 11.5–15.5)
WBC Count: 4.5 10*3/uL (ref 4.0–10.5)
nRBC: 0 % (ref 0.0–0.2)

## 2022-11-01 LAB — SAMPLE TO BLOOD BANK

## 2022-11-01 LAB — PREPARE RBC (CROSSMATCH)

## 2022-11-01 MED ORDER — SODIUM CHLORIDE 0.9 % IV SOLN: Freq: Once | INTRAVENOUS | Status: AC

## 2022-11-01 MED ORDER — SODIUM CHLORIDE 0.9% FLUSH
10.0000 mL | INTRAVENOUS | Status: DC | PRN
  Administered 2022-11-01: 10 mL

## 2022-11-01 MED ORDER — SODIUM CHLORIDE 0.9% FLUSH
10.0000 mL | Freq: Once | INTRAVENOUS | Status: AC
  Administered 2022-11-01: 10 mL

## 2022-11-01 MED ORDER — HEPARIN SOD (PORK) LOCK FLUSH 100 UNIT/ML IV SOLN
500.0000 [IU] | Freq: Once | INTRAVENOUS | Status: AC | PRN
  Administered 2022-11-01: 500 [IU]

## 2022-11-01 MED ORDER — SODIUM CHLORIDE 0.9 % IV SOLN
75.0000 mg/m2 | Freq: Once | INTRAVENOUS | Status: AC
  Administered 2022-11-01: 140 mg via INTRAVENOUS
  Filled 2022-11-01: qty 14

## 2022-11-01 NOTE — Patient Instructions (Signed)
[facility] MEDICAL ONCOLOGY  Discharge Instructions: Thank you for choosing [facility] to provide your oncology and hematology care.   If you have a lab appointment with the Cancer Center, please go directly to the Cancer Center and check in at the registration area.   Wear comfortable clothing and clothing appropriate for easy access to any Portacath or PICC line.   We strive to give you quality time with your provider. You may need to reschedule your appointment if you arrive late (15 or more minutes).  Arriving late affects you and other patients whose appointments are after yours.  Also, if you miss three or more appointments without notifying the office, you may be dismissed from the clinic at the provider's discretion.      For prescription refill requests, have your pharmacy contact our office and allow 72 hours for refills to be completed.    Today you received the following chemotherapy and/or immunotherapy agents: Vidaza.       To help prevent nausea and vomiting after your treatment, we encourage you to take your nausea medication as directed.  BELOW ARE SYMPTOMS THAT SHOULD BE REPORTED IMMEDIATELY: *FEVER GREATER THAN 100.4 F (38 C) OR HIGHER *CHILLS OR SWEATING *NAUSEA AND VOMITING THAT IS NOT CONTROLLED WITH YOUR NAUSEA MEDICATION *UNUSUAL SHORTNESS OF BREATH *UNUSUAL BRUISING OR BLEEDING *URINARY PROBLEMS (pain or burning when urinating, or frequent urination) *BOWEL PROBLEMS (unusual diarrhea, constipation, pain near the anus) TENDERNESS IN MOUTH AND THROAT WITH OR WITHOUT PRESENCE OF ULCERS (sore throat, sores in mouth, or a toothache) UNUSUAL RASH, SWELLING OR PAIN  UNUSUAL VAGINAL DISCHARGE OR ITCHING   Items with * indicate a potential emergency and should be followed up as soon as possible or go to the Emergency Department if any problems should occur.  Please show the CHEMOTHERAPY ALERT CARD or IMMUNOTHERAPY ALERT CARD at check-in to  the Emergency Department and triage nurse.  Should you have questions after your visit or need to cancel or reschedule your appointment, please contact [facility] MEDICAL ONCOLOGY  Dept: [phone]  and follow the prompts.  Office hours are 8:00 a.m. to 4:30 p.m. Monday - Friday. Please note that voicemails left after 4:00 p.m. may not be returned until the following business day.  We are closed weekends and major holidays. You have access to a nurse at all times for urgent questions. Please call the main number to the clinic Dept: [phone] and follow the prompts.   For any non-urgent questions, you may also contact your provider using MyChart. We now offer e-Visits for anyone 18 and older to request care online for non-urgent symptoms. For details visit [email or web address].   Also download the MyChart app! Go to the app store, search "MyChart", open the app, select [place], and log in with your MyChart username and password.   

## 2022-11-01 NOTE — Progress Notes (Signed)
Mr. Shorten states he took his Zofran this morning at home around 0830. Per Dr. Irene Limbo, [facility] to treat with hgb. 6.8g/dL, will receive RBC's tomorrow.

## 2022-11-02 ENCOUNTER — Inpatient Hospital Stay: Payer: Medicare Other

## 2022-11-02 VITALS — BP 99/56 | HR 71 | Temp 98.2°F | Resp 16

## 2022-11-02 DIAGNOSIS — D469 Myelodysplastic syndrome, unspecified: Secondary | ICD-10-CM | POA: Diagnosis not present

## 2022-11-02 DIAGNOSIS — K219 Gastro-esophageal reflux disease without esophagitis: Secondary | ICD-10-CM | POA: Diagnosis not present

## 2022-11-02 DIAGNOSIS — D649 Anemia, unspecified: Secondary | ICD-10-CM

## 2022-11-02 DIAGNOSIS — M542 Cervicalgia: Secondary | ICD-10-CM | POA: Diagnosis not present

## 2022-11-02 DIAGNOSIS — Z86010 Personal history of colonic polyps: Secondary | ICD-10-CM | POA: Diagnosis not present

## 2022-11-02 DIAGNOSIS — E785 Hyperlipidemia, unspecified: Secondary | ICD-10-CM | POA: Diagnosis not present

## 2022-11-02 DIAGNOSIS — Z87891 Personal history of nicotine dependence: Secondary | ICD-10-CM | POA: Diagnosis not present

## 2022-11-02 DIAGNOSIS — Z79899 Other long term (current) drug therapy: Secondary | ICD-10-CM | POA: Diagnosis not present

## 2022-11-02 MED ORDER — ACETAMINOPHEN 325 MG PO TABS
650.0000 mg | ORAL_TABLET | Freq: Once | ORAL | Status: AC
  Administered 2022-11-02: 650 mg via ORAL
  Filled 2022-11-02: qty 2

## 2022-11-02 MED ORDER — SODIUM CHLORIDE 0.9% FLUSH
10.0000 mL | INTRAVENOUS | Status: DC | PRN
  Administered 2022-11-02: 10 mL

## 2022-11-02 MED ORDER — METHYLPREDNISOLONE SODIUM SUCC 40 MG IJ SOLR
40.0000 mg | Freq: Once | INTRAMUSCULAR | Status: AC
  Administered 2022-11-02: 40 mg via INTRAVENOUS
  Filled 2022-11-02: qty 1

## 2022-11-02 MED ORDER — SODIUM CHLORIDE 0.9% IV SOLUTION
250.0000 mL | Freq: Once | INTRAVENOUS | Status: AC
  Administered 2022-11-02: 250 mL via INTRAVENOUS

## 2022-11-02 MED ORDER — SODIUM CHLORIDE 0.9 % IV SOLN
75.0000 mg/m2 | Freq: Once | INTRAVENOUS | Status: AC
  Administered 2022-11-02: 140 mg via INTRAVENOUS
  Filled 2022-11-02: qty 14

## 2022-11-02 MED ORDER — HEPARIN SOD (PORK) LOCK FLUSH 100 UNIT/ML IV SOLN
500.0000 [IU] | Freq: Once | INTRAVENOUS | Status: AC | PRN
  Administered 2022-11-02: 500 [IU]

## 2022-11-02 MED ORDER — SODIUM CHLORIDE 0.9 % IV SOLN: Freq: Once | INTRAVENOUS | Status: AC

## 2022-11-02 NOTE — Patient Instructions (Signed)
[facility] ONCOLOGY  Discharge Instructions: Thank you for choosing [facility] to provide your oncology and hematology care.   If you have a lab appointment with the [facility], please go directly to the [facility] and check in at the registration area.   Wear comfortable clothing and clothing appropriate for easy access to any Portacath or PICC line.   We strive to give you quality time with your provider. You may need to reschedule your appointment if you arrive late (15 or more minutes).  Arriving late affects you and other patients whose appointments are after yours.  Also, if you miss three or more appointments without notifying the office, you may be dismissed from the clinic at the provider's discretion.      For prescription refill requests, have your pharmacy contact our office and allow 72 hours for refills to be completed.    Today you received the following chemotherapy and/or immunotherapy agents vidaza      To help prevent nausea and vomiting after your treatment, we encourage you to take your nausea medication as directed.  BELOW ARE SYMPTOMS THAT SHOULD BE REPORTED IMMEDIATELY: *FEVER GREATER THAN 100.4 F (38 C) OR HIGHER *CHILLS OR SWEATING *NAUSEA AND VOMITING THAT IS NOT CONTROLLED WITH YOUR NAUSEA MEDICATION *UNUSUAL SHORTNESS OF BREATH *UNUSUAL BRUISING OR BLEEDING *URINARY PROBLEMS (pain or burning when urinating, or frequent urination) *BOWEL PROBLEMS (unusual diarrhea, constipation, pain near the anus) TENDERNESS IN MOUTH AND THROAT WITH OR WITHOUT PRESENCE OF ULCERS (sore throat, sores in mouth, or a toothache) UNUSUAL RASH, SWELLING OR PAIN  UNUSUAL VAGINAL DISCHARGE OR ITCHING   Items with * indicate a potential emergency and should be followed up as soon as possible or go to the Emergency Department if any problems should occur.  Please show the CHEMOTHERAPY ALERT CARD or IMMUNOTHERAPY ALERT CARD at check-in to the  Emergency Department and triage nurse.  Should you have questions after your visit or need to cancel or reschedule your appointment, please contact [facility]  Dept: [phone]  and follow the prompts.  Office hours are 8:00 a.m. to 4:30 p.m. Monday - Friday. Please note that voicemails left after 4:00 p.m. may not be returned until the following business day.  We are closed weekends and major holidays. You have access to a nurse at all times for urgent questions. Please call the main number to the clinic Dept: [phone] and follow the prompts.   For any non-urgent questions, you may also contact your provider using MyChart. We now offer e-Visits for anyone 60 and older to request care online for non-urgent symptoms. For details visit [email or web address].   Also download the MyChart app! Go to the app store, search "MyChart", open the app, select [facility], and log in with your MyChart username and password.

## 2022-11-03 ENCOUNTER — Inpatient Hospital Stay: Payer: Medicare Other

## 2022-11-03 VITALS — BP 117/60 | HR 78 | Temp 97.6°F | Resp 18

## 2022-11-03 DIAGNOSIS — D469 Myelodysplastic syndrome, unspecified: Secondary | ICD-10-CM

## 2022-11-03 DIAGNOSIS — Z79899 Other long term (current) drug therapy: Secondary | ICD-10-CM | POA: Diagnosis not present

## 2022-11-03 DIAGNOSIS — E785 Hyperlipidemia, unspecified: Secondary | ICD-10-CM | POA: Diagnosis not present

## 2022-11-03 DIAGNOSIS — M542 Cervicalgia: Secondary | ICD-10-CM | POA: Diagnosis not present

## 2022-11-03 DIAGNOSIS — Z87891 Personal history of nicotine dependence: Secondary | ICD-10-CM | POA: Diagnosis not present

## 2022-11-03 DIAGNOSIS — Z86010 Personal history of colonic polyps: Secondary | ICD-10-CM | POA: Diagnosis not present

## 2022-11-03 DIAGNOSIS — D649 Anemia, unspecified: Secondary | ICD-10-CM

## 2022-11-03 DIAGNOSIS — K219 Gastro-esophageal reflux disease without esophagitis: Secondary | ICD-10-CM | POA: Diagnosis not present

## 2022-11-03 MED ORDER — HEPARIN SOD (PORK) LOCK FLUSH 100 UNIT/ML IV SOLN
500.0000 [IU] | Freq: Once | INTRAVENOUS | Status: AC | PRN
  Administered 2022-11-03: 500 [IU]

## 2022-11-03 MED ORDER — SODIUM CHLORIDE 0.9 % IV SOLN: Freq: Once | INTRAVENOUS | Status: AC

## 2022-11-03 MED ORDER — SODIUM CHLORIDE 0.9% FLUSH
10.0000 mL | INTRAVENOUS | Status: DC | PRN
  Administered 2022-11-03: 10 mL

## 2022-11-03 MED ORDER — SODIUM CHLORIDE 0.9 % IV SOLN
75.0000 mg/m2 | Freq: Once | INTRAVENOUS | Status: AC
  Administered 2022-11-03: 140 mg via INTRAVENOUS
  Filled 2022-11-03: qty 14

## 2022-11-03 NOTE — Progress Notes (Signed)
Pt states he took Zofran at home today prior to arriving for [facility].

## 2022-11-03 NOTE — Patient Instructions (Signed)
[facility] MEDICAL ONCOLOGY  Discharge Instructions: Thank you for choosing [facility] to provide your oncology and hematology care.   If you have a lab appointment with the Cancer Center, please go directly to the Cancer Center and check in at the registration area.   Wear comfortable clothing and clothing appropriate for easy access to any Portacath or PICC line.   We strive to give you quality time with your provider. You may need to reschedule your appointment if you arrive late (15 or more minutes).  Arriving late affects you and other patients whose appointments are after yours.  Also, if you miss three or more appointments without notifying the office, you may be dismissed from the clinic at the provider's discretion.      For prescription refill requests, have your pharmacy contact our office and allow 72 hours for refills to be completed.    Today you received the following chemotherapy and/or immunotherapy agents: Vidaza.       To help prevent nausea and vomiting after your treatment, we encourage you to take your nausea medication as directed.  BELOW ARE SYMPTOMS THAT SHOULD BE REPORTED IMMEDIATELY: *FEVER GREATER THAN 100.4 F (38 C) OR HIGHER *CHILLS OR SWEATING *NAUSEA AND VOMITING THAT IS NOT CONTROLLED WITH YOUR NAUSEA MEDICATION *UNUSUAL SHORTNESS OF BREATH *UNUSUAL BRUISING OR BLEEDING *URINARY PROBLEMS (pain or burning when urinating, or frequent urination) *BOWEL PROBLEMS (unusual diarrhea, constipation, pain near the anus) TENDERNESS IN MOUTH AND THROAT WITH OR WITHOUT PRESENCE OF ULCERS (sore throat, sores in mouth, or a toothache) UNUSUAL RASH, SWELLING OR PAIN  UNUSUAL VAGINAL DISCHARGE OR ITCHING   Items with * indicate a potential emergency and should be followed up as soon as possible or go to the Emergency Department if any problems should occur.  Please show the CHEMOTHERAPY ALERT CARD or IMMUNOTHERAPY ALERT CARD at check-in to  the Emergency Department and triage nurse.  Should you have questions after your visit or need to cancel or reschedule your appointment, please contact [facility]  Dept: [phone]  and follow the prompts.  Office hours are 8:00 a.m. to 4:30 p.m. Monday - Friday. Please note that voicemails left after 4:00 p.m. may not be returned until the following business day.  We are closed weekends and major holidays. You have access to a nurse at all times for urgent questions. Please call the main number to the clinic Dept: [phone] and follow the prompts.   For any non-urgent questions, you may also contact your provider using MyChart. We now offer e-Visits for anyone 18 and older to request care online for non-urgent symptoms. For details visit [email or web address].   Also download the MyChart app! Go to the app store, search "MyChart", open the app, select [facility], and log in with your MyChart username and password.   

## 2022-11-04 ENCOUNTER — Inpatient Hospital Stay: Payer: Medicare Other

## 2022-11-04 ENCOUNTER — Other Ambulatory Visit: Payer: Self-pay

## 2022-11-04 ENCOUNTER — Other Ambulatory Visit: Payer: Medicare Other

## 2022-11-04 VITALS — BP 102/61 | HR 62 | Temp 97.7°F | Resp 18

## 2022-11-04 DIAGNOSIS — Z87891 Personal history of nicotine dependence: Secondary | ICD-10-CM | POA: Diagnosis not present

## 2022-11-04 DIAGNOSIS — D649 Anemia, unspecified: Secondary | ICD-10-CM

## 2022-11-04 DIAGNOSIS — D469 Myelodysplastic syndrome, unspecified: Secondary | ICD-10-CM | POA: Diagnosis not present

## 2022-11-04 DIAGNOSIS — Z86010 Personal history of colonic polyps: Secondary | ICD-10-CM | POA: Diagnosis not present

## 2022-11-04 DIAGNOSIS — E785 Hyperlipidemia, unspecified: Secondary | ICD-10-CM | POA: Diagnosis not present

## 2022-11-04 DIAGNOSIS — M542 Cervicalgia: Secondary | ICD-10-CM | POA: Diagnosis not present

## 2022-11-04 DIAGNOSIS — K219 Gastro-esophageal reflux disease without esophagitis: Secondary | ICD-10-CM | POA: Diagnosis not present

## 2022-11-04 DIAGNOSIS — Z79899 Other long term (current) drug therapy: Secondary | ICD-10-CM | POA: Diagnosis not present

## 2022-11-04 LAB — CBC WITH DIFFERENTIAL (CANCER CENTER ONLY)
Abs Immature Granulocytes: 0.26 10*3/uL — ABNORMAL HIGH (ref 0.00–0.07)
Basophils Absolute: 0.2 10*3/uL — ABNORMAL HIGH (ref 0.0–0.1)
Basophils Relative: 4 %
Eosinophils Absolute: 0.5 10*3/uL (ref 0.0–0.5)
Eosinophils Relative: 9 %
HCT: 21.8 % — ABNORMAL LOW (ref 39.0–52.0)
Hemoglobin: 7.2 g/dL — ABNORMAL LOW (ref 13.0–17.0)
Immature Granulocytes: 5 %
Lymphocytes Relative: 10 %
Lymphs Abs: 0.6 10*3/uL — ABNORMAL LOW (ref 0.7–4.0)
MCH: 28.3 pg (ref 26.0–34.0)
MCHC: 33 g/dL (ref 30.0–36.0)
MCV: 85.8 fL (ref 80.0–100.0)
Monocytes Absolute: 0.2 10*3/uL (ref 0.1–1.0)
Monocytes Relative: 4 %
Neutro Abs: 3.8 10*3/uL (ref 1.7–7.7)
Neutrophils Relative %: 68 %
Platelet Count: 543 10*3/uL — ABNORMAL HIGH (ref 150–400)
RBC: 2.54 MIL/uL — ABNORMAL LOW (ref 4.22–5.81)
RDW: 16.1 % — ABNORMAL HIGH (ref 11.5–15.5)
WBC Count: 5.6 10*3/uL (ref 4.0–10.5)
nRBC: 0 % (ref 0.0–0.2)

## 2022-11-04 LAB — PREPARE RBC (CROSSMATCH)

## 2022-11-04 MED ORDER — METHYLPREDNISOLONE SODIUM SUCC 40 MG IJ SOLR
40.0000 mg | Freq: Once | INTRAMUSCULAR | Status: AC
  Administered 2022-11-04: 40 mg via INTRAVENOUS
  Filled 2022-11-04: qty 1

## 2022-11-04 MED ORDER — ONDANSETRON HCL 8 MG PO TABS
8.0000 mg | ORAL_TABLET | Freq: Once | ORAL | Status: DC
  Filled 2022-11-04: qty 1

## 2022-11-04 MED ORDER — ACETAMINOPHEN 325 MG PO TABS
650.0000 mg | ORAL_TABLET | Freq: Once | ORAL | Status: AC
  Administered 2022-11-04: 650 mg via ORAL
  Filled 2022-11-04: qty 2

## 2022-11-04 MED ORDER — SODIUM CHLORIDE 0.9% IV SOLUTION
250.0000 mL | Freq: Once | INTRAVENOUS | Status: AC
  Administered 2022-11-04: 250 mL via INTRAVENOUS

## 2022-11-04 MED ORDER — SODIUM CHLORIDE 0.9% FLUSH
10.0000 mL | Freq: Once | INTRAVENOUS | Status: AC
  Administered 2022-11-04: 10 mL

## 2022-11-04 MED ORDER — SODIUM CHLORIDE 0.9 % IV SOLN
75.0000 mg/m2 | Freq: Once | INTRAVENOUS | Status: AC
  Administered 2022-11-04: 140 mg via INTRAVENOUS
  Filled 2022-11-04: qty 14

## 2022-11-04 MED ORDER — SODIUM CHLORIDE 0.9 % IV SOLN: Freq: Once | INTRAVENOUS | Status: AC

## 2022-11-04 NOTE — Patient Instructions (Signed)

## 2022-11-05 ENCOUNTER — Inpatient Hospital Stay: Payer: Medicare Other

## 2022-11-05 VITALS — BP 100/59 | HR 63 | Temp 97.7°F | Resp 18

## 2022-11-05 DIAGNOSIS — Z87891 Personal history of nicotine dependence: Secondary | ICD-10-CM | POA: Diagnosis not present

## 2022-11-05 DIAGNOSIS — D469 Myelodysplastic syndrome, unspecified: Secondary | ICD-10-CM

## 2022-11-05 DIAGNOSIS — K219 Gastro-esophageal reflux disease without esophagitis: Secondary | ICD-10-CM | POA: Diagnosis not present

## 2022-11-05 DIAGNOSIS — Z86010 Personal history of colonic polyps: Secondary | ICD-10-CM | POA: Diagnosis not present

## 2022-11-05 DIAGNOSIS — Z79899 Other long term (current) drug therapy: Secondary | ICD-10-CM | POA: Diagnosis not present

## 2022-11-05 DIAGNOSIS — D649 Anemia, unspecified: Secondary | ICD-10-CM

## 2022-11-05 DIAGNOSIS — E785 Hyperlipidemia, unspecified: Secondary | ICD-10-CM | POA: Diagnosis not present

## 2022-11-05 DIAGNOSIS — M542 Cervicalgia: Secondary | ICD-10-CM | POA: Diagnosis not present

## 2022-11-05 LAB — TYPE AND SCREEN
ABO/RH(D): A POS
Antibody Screen: NEGATIVE
Unit division: 0
Unit division: 0

## 2022-11-05 LAB — BPAM RBC
Blood Product Expiration Date: 202401232359
Blood Product Expiration Date: 202402112359
ISSUE DATE / TIME: 202401160804
ISSUE DATE / TIME: 202401181047
Unit Type and Rh: 6200
Unit Type and Rh: 6200

## 2022-11-05 MED ORDER — ONDANSETRON HCL 8 MG PO TABS: 8.0000 mg | ORAL_TABLET | Freq: Once | ORAL | Status: DC

## 2022-11-05 MED ORDER — SODIUM CHLORIDE 0.9 % IV SOLN: Freq: Once | INTRAVENOUS | Status: AC

## 2022-11-05 MED ORDER — HEPARIN SOD (PORK) LOCK FLUSH 100 UNIT/ML IV SOLN
500.0000 [IU] | Freq: Once | INTRAVENOUS | Status: AC | PRN
  Administered 2022-11-05: 500 [IU]

## 2022-11-05 MED ORDER — SODIUM CHLORIDE 0.9% FLUSH
10.0000 mL | INTRAVENOUS | Status: DC | PRN
  Administered 2022-11-05: 10 mL

## 2022-11-05 MED ORDER — SODIUM CHLORIDE 0.9 % IV SOLN
75.0000 mg/m2 | Freq: Once | INTRAVENOUS | Status: AC
  Administered 2022-11-05: 140 mg via INTRAVENOUS
  Filled 2022-11-05: qty 14

## 2022-11-05 NOTE — Patient Instructions (Signed)
[facility]ICAL [facility]COLOG[facility]  Discharge Instructions: Thank you for choosing [facility] to provide your oncology and hematology care.   If you have a lab appointment with the Cancer Center, please go directly to the Cancer Center and check in at the registration area.   Wear comfortable clothing and clothing appropriate for easy access to any Portacath or PICC line.   We strive to give you quality time with your provider. You may need to reschedule your appointment if you arrive late (15 or more minutes).  Arriving late affects you and other patients whose appointments are after yours.  Also, if you miss three or more appointments without notifying the office, you may be dismissed from the clinic at the provider's discretion.      For prescription refill requests, have your pharmacy contact our office and allow 72 hours for refills to be completed.    Today you received the following chemotherapy and/or immunotherapy agents: Vidaza.       To help prevent nausea and vomiting after your treatment, we encourage you to take your nausea medication as directed.  BELOW ARE SYMPTOMS THAT SHOULD BE REPORTED IMMEDIATELY: *FEVER GREATER THAN 100.4 F (38 C) OR HIGHER *CHILLS OR SWEATING *NAUSEA AND VOMITING THAT IS NOT CONTROLLED WITH YOUR NAUSEA MEDICATION *UNUSUAL SHORTNESS OF BREATH *UNUSUAL BRUISING OR BLEEDING *URINARY PROBLEMS (pain or burning when urinating, or frequent urination) *BOWEL PROBLEMS (unusual diarrhea, constipation, pain near the anus) TENDERNESS IN MOUTH AND THROAT WITH OR WITHOUT PRESENCE OF ULCERS (sore throat, sores in mouth, or a toothache) UNUSUAL RASH, SWELLING OR PAIN  UNUSUAL VAGINAL DISCHARGE OR ITCHING   Items with * indicate a potential emergency and should be followed up as soon as possible or go to the Emergency Department if any problems should occur.  Please show the CHEMOTHERAPY ALERT CARD or IMMUNOTHERAPY ALERT CARD at check-in to  the Emergency Department and triage nurse.  Should you have questions after your visit or need to cancel or reschedule your appointment, please contact [facility]  Dept: [phone]  and follow the prompts.  Office hours are 8:00 a.m. to 4:30 p.m. Monday - Friday. Please note that voicemails left after 4:00 p.m. may not be returned until the following business day.  We are closed weekends and major holidays. You have access to a nurse at all times for urgent questions. Please call the main number to the clinic Dept: [phone] and follow the prompts.   For any non-urgent questions, you may also contact your provider using MyChart. We now offer e-Visits for anyone 18 and older to request care online for non-urgent symptoms. For details visit [email or web address].   Also download the MyChart app! Go to the app store, search "MyChart", open the app, select Center, and log in with your MyChart username and password.   

## 2022-11-11 ENCOUNTER — Other Ambulatory Visit: Payer: Self-pay

## 2022-11-11 ENCOUNTER — Inpatient Hospital Stay: Payer: Medicare Other

## 2022-11-11 DIAGNOSIS — D469 Myelodysplastic syndrome, unspecified: Secondary | ICD-10-CM

## 2022-11-11 DIAGNOSIS — Z87891 Personal history of nicotine dependence: Secondary | ICD-10-CM | POA: Diagnosis not present

## 2022-11-11 DIAGNOSIS — M542 Cervicalgia: Secondary | ICD-10-CM | POA: Diagnosis not present

## 2022-11-11 DIAGNOSIS — E785 Hyperlipidemia, unspecified: Secondary | ICD-10-CM | POA: Diagnosis not present

## 2022-11-11 DIAGNOSIS — K219 Gastro-esophageal reflux disease without esophagitis: Secondary | ICD-10-CM | POA: Diagnosis not present

## 2022-11-11 DIAGNOSIS — Z79899 Other long term (current) drug therapy: Secondary | ICD-10-CM | POA: Diagnosis not present

## 2022-11-11 DIAGNOSIS — D649 Anemia, unspecified: Secondary | ICD-10-CM

## 2022-11-11 DIAGNOSIS — Z86010 Personal history of colonic polyps: Secondary | ICD-10-CM | POA: Diagnosis not present

## 2022-11-11 LAB — CBC WITH DIFFERENTIAL (CANCER CENTER ONLY)
Abs Immature Granulocytes: 0.07 10*3/uL (ref 0.00–0.07)
Basophils Absolute: 0.1 10*3/uL (ref 0.0–0.1)
Basophils Relative: 2 %
Eosinophils Absolute: 0.4 10*3/uL (ref 0.0–0.5)
Eosinophils Relative: 9 %
HCT: 20.4 % — ABNORMAL LOW (ref 39.0–52.0)
Hemoglobin: 6.8 g/dL — CL (ref 13.0–17.0)
Immature Granulocytes: 2 %
Lymphocytes Relative: 8 %
Lymphs Abs: 0.3 10*3/uL — ABNORMAL LOW (ref 0.7–4.0)
MCH: 28.7 pg (ref 26.0–34.0)
MCHC: 33.3 g/dL (ref 30.0–36.0)
MCV: 86.1 fL (ref 80.0–100.0)
Monocytes Absolute: 0.2 10*3/uL (ref 0.1–1.0)
Monocytes Relative: 5 %
Neutro Abs: 3 10*3/uL (ref 1.7–7.7)
Neutrophils Relative %: 74 %
Platelet Count: 240 10*3/uL (ref 150–400)
RBC: 2.37 MIL/uL — ABNORMAL LOW (ref 4.22–5.81)
RDW: 15.2 % (ref 11.5–15.5)
WBC Count: 4.1 10*3/uL (ref 4.0–10.5)
nRBC: 0 % (ref 0.0–0.2)

## 2022-11-11 LAB — PREPARE RBC (CROSSMATCH)

## 2022-11-11 MED ORDER — METHYLPREDNISOLONE SODIUM SUCC 40 MG IJ SOLR
40.0000 mg | Freq: Once | INTRAMUSCULAR | Status: AC
  Administered 2022-11-11: 40 mg via INTRAVENOUS
  Filled 2022-11-11: qty 1

## 2022-11-11 MED ORDER — SODIUM CHLORIDE 0.9% IV SOLUTION
250.0000 mL | Freq: Once | INTRAVENOUS | Status: AC
  Administered 2022-11-11: 250 mL via INTRAVENOUS

## 2022-11-11 MED ORDER — HEPARIN SOD (PORK) LOCK FLUSH 100 UNIT/ML IV SOLN
500.0000 [IU] | Freq: Every day | INTRAVENOUS | Status: AC | PRN
  Administered 2022-11-11: 500 [IU]

## 2022-11-11 MED ORDER — SODIUM CHLORIDE 0.9% FLUSH
10.0000 mL | INTRAVENOUS | Status: AC | PRN
  Administered 2022-11-11: 10 mL

## 2022-11-11 MED ORDER — ACETAMINOPHEN 325 MG PO TABS
650.0000 mg | ORAL_TABLET | Freq: Once | ORAL | Status: AC
  Administered 2022-11-11: 650 mg via ORAL
  Filled 2022-11-11: qty 2

## 2022-11-11 MED ORDER — SODIUM CHLORIDE 0.9% FLUSH
10.0000 mL | Freq: Once | INTRAVENOUS | Status: AC
  Administered 2022-11-11: 10 mL

## 2022-11-11 NOTE — Patient Instructions (Signed)
Blood Transfusion, Adult, Care After The following information offers guidance on how to care for yourself after your procedure. Your health care provider may also give you more specific instructions. If you have problems or questions, contact your health care provider. What can I expect after the procedure? After the procedure, it is common to have: Bruising and soreness where the IV was inserted. A headache. Follow these instructions at home: IV insertion site care     Follow instructions from your health care provider about how to take care of your IV insertion site. Make sure you: Wash your hands with soap and water for at least 20 seconds before and after you change your bandage (dressing). If soap and water are not available, use hand sanitizer. Change your dressing as told by your health care provider. Check your IV insertion site every day for signs of infection. Check for: Redness, swelling, or pain. Bleeding from the site. Warmth. Pus or a bad smell. General instructions Take over-the-counter and prescription medicines only as told by your health care provider. Rest as told by your health care provider. Return to your normal activities as told by your health care provider. Keep all follow-up visits. Lab tests may need to be done at certain periods to recheck your blood counts. Contact a health care provider if: You have itching or red, swollen areas of skin (hives). You have a fever or chills. You have pain in the head, back, or chest. You feel anxious or you feel weak after doing your normal activities. You have redness, swelling, warmth, or pain around the IV insertion site. You have blood coming from the IV insertion site that does not stop with pressure. You have pus or a bad smell coming from your IV insertion site. If you received your blood transfusion in an outpatient setting, you will be told whom to contact to report any reactions. Get help right away if: You  have symptoms of a serious allergic or immune system reaction, including: Trouble breathing or shortness of breath. Swelling of the face, feeling flushed, or widespread rash. Dark urine or blood in the urine. Fast heartbeat. These symptoms may be an emergency. Get help right away. Call 911. Do not wait to see if the symptoms will go away. Do not drive yourself to the hospital. Summary Bruising and soreness around the IV insertion site are common. Check your IV insertion site every day for signs of infection. Rest as told by your health care provider. Return to your normal activities as told by your health care provider. Get help right away for symptoms of a serious allergic or immune system reaction to the blood transfusion. This information is not intended to replace advice given to you by your health care provider. Make sure you discuss any questions you have with your health care provider. Document Revised: 01/01/2022 Document Reviewed: 01/01/2022 Elsevier Patient Education  2023 Elsevier Inc.  

## 2022-11-12 LAB — TYPE AND SCREEN
ABO/RH(D): A POS
Antibody Screen: NEGATIVE
Unit division: 0

## 2022-11-12 LAB — BPAM RBC
Blood Product Expiration Date: 202402182359
ISSUE DATE / TIME: 202401251025
Unit Type and Rh: 6200

## 2022-11-18 ENCOUNTER — Inpatient Hospital Stay: Payer: Medicare Other | Attending: Hematology

## 2022-11-18 ENCOUNTER — Other Ambulatory Visit: Payer: Self-pay

## 2022-11-18 ENCOUNTER — Inpatient Hospital Stay: Payer: Medicare Other

## 2022-11-18 DIAGNOSIS — D469 Myelodysplastic syndrome, unspecified: Secondary | ICD-10-CM

## 2022-11-18 DIAGNOSIS — D649 Anemia, unspecified: Secondary | ICD-10-CM

## 2022-11-18 DIAGNOSIS — Z5111 Encounter for antineoplastic chemotherapy: Secondary | ICD-10-CM | POA: Insufficient documentation

## 2022-11-18 LAB — CBC WITH DIFFERENTIAL (CANCER CENTER ONLY)
Abs Immature Granulocytes: 0.09 10*3/uL — ABNORMAL HIGH (ref 0.00–0.07)
Abs Immature Granulocytes: 0.2 10*3/uL — ABNORMAL HIGH (ref 0.00–0.07)
Basophils Absolute: 0 10*3/uL (ref 0.0–0.1)
Basophils Absolute: 0.2 10*3/uL — ABNORMAL HIGH (ref 0.0–0.1)
Basophils Relative: 0 %
Basophils Relative: 2 %
Eosinophils Absolute: 0.2 10*3/uL (ref 0.0–0.5)
Eosinophils Absolute: 0.1 10*3/uL (ref 0.0–0.5)
Eosinophils Relative: 6 %
Eosinophils Relative: 2 %
HCT: 13.6 % — ABNORMAL LOW (ref 39.0–52.0)
HCT: 24.3 % — ABNORMAL LOW (ref 39.0–52.0)
Hemoglobin: 4.4 g/dL — CL (ref 13.0–17.0)
Hemoglobin: 8.4 g/dL — ABNORMAL LOW (ref 13.0–17.0)
Immature Granulocytes: 3 %
Immature Granulocytes: 3 %
Lymphocytes Relative: 12 %
Lymphocytes Relative: 9 %
Lymphs Abs: 0.4 10*3/uL — ABNORMAL LOW (ref 0.7–4.0)
Lymphs Abs: 0.5 10*3/uL — ABNORMAL LOW (ref 0.7–4.0)
MCH: 28.9 pg (ref 26.0–34.0)
MCH: 29.9 pg (ref 26.0–34.0)
MCHC: 32.4 g/dL (ref 30.0–36.0)
MCHC: 34.6 g/dL (ref 30.0–36.0)
MCV: 89.5 fL (ref 80.0–100.0)
MCV: 86.5 fL (ref 80.0–100.0)
Monocytes Absolute: 0.2 10*3/uL (ref 0.1–1.0)
Monocytes Absolute: 0.2 10*3/uL (ref 0.1–1.0)
Monocytes Relative: 5 %
Monocytes Relative: 3 %
Neutro Abs: 2.6 10*3/uL (ref 1.7–7.7)
Neutro Abs: 5 10*3/uL (ref 1.7–7.7)
Neutrophils Relative %: 74 %
Neutrophils Relative %: 81 %
Platelet Count: 424 10*3/uL — ABNORMAL HIGH (ref 150–400)
Platelet Count: 575 10*3/uL — ABNORMAL HIGH (ref 150–400)
RBC: 1.52 MIL/uL — ABNORMAL LOW (ref 4.22–5.81)
RBC: 2.81 MIL/uL — ABNORMAL LOW (ref 4.22–5.81)
RDW: 15.9 % — ABNORMAL HIGH (ref 11.5–15.5)
RDW: 15.2 % (ref 11.5–15.5)
WBC Count: 3.5 10*3/uL — ABNORMAL LOW (ref 4.0–10.5)
WBC Count: 6.2 10*3/uL (ref 4.0–10.5)
nRBC: 0 % (ref 0.0–0.2)
nRBC: 0 % (ref 0.0–0.2)

## 2022-11-18 LAB — CMP (CANCER CENTER ONLY)
ALT: 13 U/L (ref 0–44)
AST: 9 U/L — ABNORMAL LOW (ref 15–41)
Albumin: 3.7 g/dL (ref 3.5–5.0)
Alkaline Phosphatase: 92 U/L (ref 38–126)
Anion gap: 6 (ref 5–15)
BUN: 17 mg/dL (ref 8–23)
CO2: 25 mmol/L (ref 22–32)
Calcium: 8.3 mg/dL — ABNORMAL LOW (ref 8.9–10.3)
Chloride: 104 mmol/L (ref 98–111)
Creatinine: 0.81 mg/dL (ref 0.61–1.24)
GFR, Estimated: >60 mL/min (ref >60)
Glucose, Bld: 192 mg/dL — ABNORMAL HIGH (ref 70–99)
Potassium: 4.7 mmol/L (ref 3.5–5.1)
Sodium: 135 mmol/L (ref 135–145)
Total Bilirubin: 1.1 mg/dL (ref 0.3–1.2)
Total Protein: 5.9 g/dL — ABNORMAL LOW (ref 6.5–8.1)

## 2022-11-18 LAB — RETIC PANEL
Immature Retic Fract: 6.1 % (ref 2.3–15.9)
RBC.: 2.81 MIL/uL — ABNORMAL LOW (ref 4.22–5.81)
Retic Count, Absolute: 25.3 10*3/uL (ref 19.0–186.0)
Retic Ct Pct: 0.9 % (ref 0.4–3.1)
Reticulocyte Hemoglobin: 33.2 pg (ref >27.9)

## 2022-11-18 LAB — PREPARE RBC (CROSSMATCH)

## 2022-11-18 MED ORDER — ACETAMINOPHEN 325 MG PO TABS
650.0000 mg | ORAL_TABLET | Freq: Once | ORAL | Status: AC
  Administered 2022-11-18: 650 mg via ORAL
  Filled 2022-11-18: qty 2

## 2022-11-18 MED ORDER — SODIUM CHLORIDE 0.9% FLUSH
10.0000 mL | INTRAVENOUS | Status: AC | PRN
  Administered 2022-11-18: 10 mL

## 2022-11-18 MED ORDER — SODIUM CHLORIDE 0.9% IV SOLUTION
250.0000 mL | Freq: Once | INTRAVENOUS | Status: AC
  Administered 2022-11-18: 250 mL via INTRAVENOUS

## 2022-11-18 MED ORDER — METHYLPREDNISOLONE SODIUM SUCC 40 MG IJ SOLR
40.0000 mg | Freq: Once | INTRAMUSCULAR | Status: AC
  Administered 2022-11-18: 40 mg via INTRAVENOUS
  Filled 2022-11-18: qty 1

## 2022-11-18 MED ORDER — HEPARIN SOD (PORK) LOCK FLUSH 100 UNIT/ML IV SOLN
500.0000 [IU] | Freq: Every day | INTRAVENOUS | Status: AC | PRN
  Administered 2022-11-18: 500 [IU]

## 2022-11-18 NOTE — Patient Instructions (Signed)
Blood Transfusion, Adult, Care After The following information offers guidance on how to care for yourself after your procedure. Your health care provider may also give you more specific instructions. If you have problems or questions, contact your health care provider. What can I expect after the procedure? After the procedure, it is common to have: Bruising and soreness where the IV was inserted. A headache. Follow these instructions at home: IV insertion site care     Follow instructions from your health care provider about how to take care of your IV insertion site. Make sure you: Wash your hands with soap and water for at least 20 seconds before and after you change your bandage (dressing). If soap and water are not available, use hand sanitizer. Change your dressing as told by your health care provider. Check your IV insertion site every day for signs of infection. Check for: Redness, swelling, or pain. Bleeding from the site. Warmth. Pus or a bad smell. General instructions Take over-the-counter and prescription medicines only as told by your health care provider. Rest as told by your health care provider. Return to your normal activities as told by your health care provider. Keep all follow-up visits. Lab tests may need to be done at certain periods to recheck your blood counts. Contact a health care provider if: You have itching or red, swollen areas of skin (hives). You have a fever or chills. You have pain in the head, back, or chest. You feel anxious or you feel weak after doing your normal activities. You have redness, swelling, warmth, or pain around the IV insertion site. You have blood coming from the IV insertion site that does not stop with pressure. You have pus or a bad smell coming from your IV insertion site. If you received your blood transfusion in an outpatient setting, you will be told whom to contact to report any reactions. Get help right away if: You  have symptoms of a serious allergic or immune system reaction, including: Trouble breathing or shortness of breath. Swelling of the face, feeling flushed, or widespread rash. Dark urine or blood in the urine. Fast heartbeat. These symptoms may be an emergency. Get help right away. Call 911. Do not wait to see if the symptoms will go away. Do not drive yourself to the hospital. Summary Bruising and soreness around the IV insertion site are common. Check your IV insertion site every day for signs of infection. Rest as told by your health care provider. Return to your normal activities as told by your health care provider. Get help right away for symptoms of a serious allergic or immune system reaction to the blood transfusion. This information is not intended to replace advice given to you by your health care provider. Make sure you discuss any questions you have with your health care provider. Document Revised: 01/01/2022 Document Reviewed: 01/01/2022 Elsevier Patient Education  2023 Elsevier Inc.  

## 2022-11-18 NOTE — Patient Instructions (Signed)

## 2022-11-19 ENCOUNTER — Inpatient Hospital Stay: Payer: Medicare Other

## 2022-11-19 ENCOUNTER — Other Ambulatory Visit: Payer: Self-pay

## 2022-11-19 VITALS — BP 114/61 | HR 70 | Temp 97.9°F | Resp 16

## 2022-11-19 DIAGNOSIS — D469 Myelodysplastic syndrome, unspecified: Secondary | ICD-10-CM | POA: Diagnosis not present

## 2022-11-19 DIAGNOSIS — Z5111 Encounter for antineoplastic chemotherapy: Secondary | ICD-10-CM | POA: Diagnosis not present

## 2022-11-19 LAB — CBC WITH DIFFERENTIAL (CANCER CENTER ONLY)
Abs Immature Granulocytes: 0.11 10*3/uL — ABNORMAL HIGH (ref 0.00–0.07)
Basophils Absolute: 0.2 10*3/uL — ABNORMAL HIGH (ref 0.0–0.1)
Basophils Relative: 3 %
Eosinophils Absolute: 0.3 10*3/uL (ref 0.0–0.5)
Eosinophils Relative: 5 %
HCT: 23.3 % — ABNORMAL LOW (ref 39.0–52.0)
Hemoglobin: 8 g/dL — ABNORMAL LOW (ref 13.0–17.0)
Immature Granulocytes: 2 %
Lymphocytes Relative: 13 %
Lymphs Abs: 0.8 10*3/uL (ref 0.7–4.0)
MCH: 29.6 pg (ref 26.0–34.0)
MCHC: 34.3 g/dL (ref 30.0–36.0)
MCV: 86.3 fL (ref 80.0–100.0)
Monocytes Absolute: 0.2 10*3/uL (ref 0.1–1.0)
Monocytes Relative: 3 %
Neutro Abs: 4.4 10*3/uL (ref 1.7–7.7)
Neutrophils Relative %: 74 %
Platelet Count: 599 10*3/uL — ABNORMAL HIGH (ref 150–400)
RBC: 2.7 MIL/uL — ABNORMAL LOW (ref 4.22–5.81)
RDW: 15.5 % (ref 11.5–15.5)
WBC Count: 6 10*3/uL (ref 4.0–10.5)
nRBC: 0 % (ref 0.0–0.2)

## 2022-11-19 LAB — RETIC PANEL
Immature Retic Fract: 7.9 % (ref 2.3–15.9)
RBC.: 2.68 MIL/uL — ABNORMAL LOW (ref 4.22–5.81)
Retic Count, Absolute: 16.3 10*3/uL — ABNORMAL LOW (ref 19.0–186.0)
Retic Ct Pct: 0.6 % (ref 0.4–3.1)
Reticulocyte Hemoglobin: 33.3 pg (ref >27.9)

## 2022-11-19 MED ORDER — HEPARIN SOD (PORK) LOCK FLUSH 100 UNIT/ML IV SOLN
500.0000 [IU] | Freq: Once | INTRAVENOUS | Status: AC
  Administered 2022-11-19: 500 [IU]

## 2022-11-19 MED ORDER — SODIUM CHLORIDE 0.9% FLUSH
10.0000 mL | Freq: Once | INTRAVENOUS | Status: AC
  Administered 2022-11-19: 10 mL

## 2022-11-19 NOTE — Progress Notes (Signed)
Per Dr. Irene Limbo, no need for blood transfusion today as patient's hgb is 8.0. Patient verbalized some confusion with his upcoming appointments and this RN spoke with the charge nurse to fix patient's upcoming appointments. Patient verbalized understanding and was advised to call [facility] with any further questions or concerns. Patient's vitals stable at time of discharge and all questions answered.

## 2022-11-21 ENCOUNTER — Other Ambulatory Visit: Payer: Self-pay

## 2022-11-22 DIAGNOSIS — D469 Myelodysplastic syndrome, unspecified: Secondary | ICD-10-CM | POA: Diagnosis not present

## 2022-11-22 LAB — BPAM RBC
Blood Product Expiration Date: 202402232359
Blood Product Expiration Date: 202402232359
Blood Product Expiration Date: 202402232359
ISSUE DATE / TIME: 202402011042
ISSUE DATE / TIME: 202402011042
Unit Type and Rh: 6200
Unit Type and Rh: 6200
Unit Type and Rh: 6200

## 2022-11-22 LAB — TYPE AND SCREEN
ABO/RH(D): A POS
Antibody Screen: NEGATIVE
Unit division: 0
Unit division: 0
Unit division: 0

## 2022-11-23 ENCOUNTER — Ambulatory Visit: Payer: Medicare Other | Attending: Cardiology | Admitting: Cardiology

## 2022-11-23 ENCOUNTER — Encounter: Payer: Self-pay | Admitting: Cardiology

## 2022-11-23 VITALS — BP 100/58 | HR 75 | Ht 67.0 in | Wt 165.0 lb

## 2022-11-23 DIAGNOSIS — G4733 Obstructive sleep apnea (adult) (pediatric): Secondary | ICD-10-CM | POA: Diagnosis not present

## 2022-11-23 DIAGNOSIS — I4729 Other ventricular tachycardia: Secondary | ICD-10-CM | POA: Diagnosis not present

## 2022-11-23 DIAGNOSIS — D469 Myelodysplastic syndrome, unspecified: Secondary | ICD-10-CM | POA: Diagnosis not present

## 2022-11-23 NOTE — Progress Notes (Signed)
Cardiology Office Note:    Date:  11/23/2022   ID:  Fernando Lee, DOB Dec 23, 1945, MRN [id number]  PCP:  Lurline Del, DO  Cardiologist:  Jenne Campus, MD    Referring MD: Lurline Del, DO   Chief Complaint  Patient presents with   Follow-up    History of Present Illness:    Fernando Lee is a 77 y.o. male with past medical history significant for ventricular tachycardia which is incidental discovery, stratification workup including stress test which was negative as well as echocardiogram both were negative.  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  He has been follow-up by oncology and receiving excellent care there.  He does take chemotherapy as well as he is receiving transfusion every week.  He said the way he feels depends a lot on transfusions.  If he gets good blood he feels good if not he feels lousy.  Denies having any chest pain tightness squeezing pressure burning chest.  Sometimes palpitation but no sustained arrhythmia, no dizziness no passing out.  Past Medical History:  Diagnosis Date   Arthritis of right knee    Cervicalgia    Chest discomfort    normal stress echo   Chest pain    Colon polyps    Dyslipidemia    Fluttering sensation of heart    GERD without esophagitis    Hematochezia    Hyperglycemia    Hyperlipidemia    Internal hemorrhoids    Kidney stones    Male erectile dysfunction, unspecified    Mixed dyslipidemia    Mixed hyperlipidemia    Ocular migraine    PAC (premature atrial contraction)    Personal history of colonic polyps    Prediabetes    Residual hemorrhoidal skin tags    Spleen enlarged    nov 2023 hospitalized   Unspecified hemorrhoids     Past Surgical History:  Procedure Laterality Date   BONE MARROW BIOPSY     IR IMAGING GUIDED PORT INSERTION  09/14/2022   KIDNEY STONE SURGERY     retrieval    Current Medications: Current Meds  Medication Sig   ALFUZOSIN HCL ER PO Take 1 tablet by mouth daily after  supper.   aspirin EC 81 MG tablet Take 81 mg by mouth daily. Swallow whole.   B Complex Vitamins (B COMPLEX PO) Take 1 tablet by mouth daily.   Cholecalciferol (VITAMIN D3) 50 MCG (2000 UT) TABS Take 2,000 Units by mouth daily.   docusate sodium (COLACE) 100 MG capsule Take 100 mg by mouth 2 (two) times daily.   FIBER PO Take 1 capsule by mouth daily. Unknown strength   fish oil-omega-3 fatty acids 1000 MG capsule Take 1 g by mouth 2 (two) times daily. Strength '300mg'$ /'1500mg'$    Flaxseed, Linseed, (FLAXSEED OIL) 1000 MG CAPS Take 1,000 mg by mouth daily.   GLUCOSAMINE CHONDROITIN MSM PO Take 1 tablet by mouth daily. Strength 1500/1500   lidocaine-prilocaine (EMLA) cream Apply to affected area once (Patient taking differently: Apply 1 Application topically once. Apply to affected area once)   Misc Natural Products (PROSTATE SUPPORT PO) Take 1 capsule by mouth daily. Unknown strength   Multiple Vitamin (MULTIVITAMIN) tablet Take 1 tablet by mouth daily. Unknown strength   ondansetron (ZOFRAN) 8 MG tablet Take 1 tablet (8 mg total) by mouth every 8 (eight) hours as needed for nausea or vomiting.   prochlorperazine (COMPAZINE) 10 MG tablet Take 1 tablet (10 mg total) by mouth every 6 (six) hours  as needed for nausea or vomiting.   psyllium (METAMUCIL) 58.6 % powder Take 1 packet by mouth daily.   senna-docusate (SENNA S) 8.6-50 MG tablet Take 2 tablets by mouth at bedtime.   traMADol (ULTRAM) 50 MG tablet Take 50 mg by mouth as needed for moderate pain or severe pain.   Turmeric (QC TUMERIC COMPLEX PO) Take 750 mg by mouth daily.   vitamin C (ASCORBIC ACID) 250 MG tablet Take 500 mg by mouth daily.   [DISCONTINUED] predniSONE (DELTASONE) 10 MG tablet 4 tabs daily for 4 days 3 tabs daily for 4 days 2 tabs daily for 4 days 1 tab daily for 4 days (Patient taking differently: Take 10 mg by mouth See admin instructions. 4 tabs daily for 4 days 3 tabs daily for 4 days 2 tabs daily for 4 days 1 tab daily for 4  days)     Allergies:   Patient has no known allergies.   Social History   Socioeconomic History   Marital status: Married    Spouse name: Not on file   Number of children: Not on file   Years of education: Not on file   Highest education level: Not on file  Occupational History   Not on file  Tobacco Use   Smoking status: Former    Types: Cigarettes   Smokeless tobacco: Never  Substance and Sexual Activity   Alcohol use: Yes    Alcohol/week: 1.0 standard drink of alcohol    Types: 1 Cans of beer per week   Drug use: Not on file   Sexual activity: Not on file  Other Topics Concern   Not on file  Social History Narrative   Not on file   Social Determinants of Health   Financial Resource Strain: Not on file  Food Insecurity: Unknown (08/31/2022)   Hunger Vital Sign    Worried About Running Out of Food in the Last Year: Never true    Ran Out of Food in the Last Year: Not on file  Transportation Needs: No Transportation Needs (08/31/2022)   PRAPARE - Hydrologist (Medical): No    Lack of Transportation (Non-Medical): No  Physical Activity: Not on file  Stress: Not on file  Social Connections: Not on file     Family History: The patient's family history includes Congestive Heart Failure in his brother; Stroke in his brother. ROS:   Please see the history of present illness.    All 14 point review of systems negative except as described per history of present illness  EKGs/Labs/Other Studies Reviewed:      Recent Labs: 11/18/2022: ALT 13; BUN 17; Creatinine 0.81; Potassium 4.7; Sodium 135 11/19/2022: Hemoglobin 8.0; Platelet Count 599  Recent Lipid Panel No results found for: "CHOL", "TRIG", "HDL", "CHOLHDL", "VLDL", "LDLCALC", "LDLDIRECT"  Physical Exam:    VS:  BP (!) 100/58 (BP Location: Left Arm, Patient Position: Sitting)   Pulse 75   Ht '5\' 7"'$  (1.702 m)   Wt 165 lb (74.8 kg)   SpO2 95%   BMI 25.84 kg/m     Wt Readings from  Last 3 Encounters:  11/23/22 165 lb (74.8 kg)  10/22/22 165 lb 8 oz (75.1 kg)  10/04/22 164 lb 9.6 oz (74.7 kg)     GEN:  Well nourished, well developed in no acute distress HEENT: Normal NECK: No JVD; No carotid bruits LYMPHATICS: No lymphadenopathy CARDIAC: RRR, no murmurs, no rubs, no gallops RESPIRATORY:  Clear to auscultation  without rales, wheezing or rhonchi  ABDOMEN: Soft, non-tender, non-distended MUSCULOSKELETAL:  No edema; No deformity  SKIN: Warm and dry LOWER EXTREMITIES: no swelling NEUROLOGIC:  Alert and oriented x 3 PSYCHIATRIC:  Normal affect   ASSESSMENT:    1. Nonsustained ventricular tachycardia (HCC)   2. Obstructive sleep apnea   3. MDS (myelodysplastic syndrome) (HCC)    PLAN:    In order of problems listed above:  Nonsustained ventricular tachycardia denies having the symptoms.  Stratification revealed low risk.  Will continue monitoring. Obstructive sleep apnea: That being managed by internal medicine team. Myelodysplastic syndrome excellent care like always by our oncology team.   Medication Adjustments/Labs and Tests Ordered: Current medicines are reviewed at length with the patient today.  Concerns regarding medicines are outlined above.  No orders of the defined types were placed in this encounter.  Medication changes: No orders of the defined types were placed in this encounter.   Signed, Park Liter, MD, [facility] 11/23/2022 12:00 PM    [place]

## 2022-11-23 NOTE — Patient Instructions (Signed)

## 2022-11-25 ENCOUNTER — Inpatient Hospital Stay: Payer: Medicare Other

## 2022-11-25 ENCOUNTER — Other Ambulatory Visit: Payer: Self-pay

## 2022-11-25 DIAGNOSIS — D469 Myelodysplastic syndrome, unspecified: Secondary | ICD-10-CM

## 2022-11-25 DIAGNOSIS — D649 Anemia, unspecified: Secondary | ICD-10-CM

## 2022-11-25 DIAGNOSIS — Z5111 Encounter for antineoplastic chemotherapy: Secondary | ICD-10-CM | POA: Diagnosis not present

## 2022-11-25 LAB — CBC WITH DIFFERENTIAL (CANCER CENTER ONLY)
Abs Immature Granulocytes: 0.2 10*3/uL — ABNORMAL HIGH (ref 0.00–0.07)
Basophils Absolute: 0.3 10*3/uL — ABNORMAL HIGH (ref 0.0–0.1)
Basophils Relative: 7 %
Eosinophils Absolute: 0.3 10*3/uL (ref 0.0–0.5)
Eosinophils Relative: 7 %
HCT: 20.4 % — ABNORMAL LOW (ref 39.0–52.0)
Hemoglobin: 7 g/dL — ABNORMAL LOW (ref 13.0–17.0)
Immature Granulocytes: 5 %
Lymphocytes Relative: 14 %
Lymphs Abs: 0.6 10*3/uL — ABNORMAL LOW (ref 0.7–4.0)
MCH: 30.2 pg (ref 26.0–34.0)
MCHC: 34.3 g/dL (ref 30.0–36.0)
MCV: 87.9 fL (ref 80.0–100.0)
Monocytes Absolute: 0.3 10*3/uL (ref 0.1–1.0)
Monocytes Relative: 6 %
Neutro Abs: 2.4 10*3/uL (ref 1.7–7.7)
Neutrophils Relative %: 61 %
Platelet Count: 809 10*3/uL — ABNORMAL HIGH (ref 150–400)
RBC: 2.32 MIL/uL — ABNORMAL LOW (ref 4.22–5.81)
RDW: 15.7 % — ABNORMAL HIGH (ref 11.5–15.5)
WBC Count: 3.9 10*3/uL — ABNORMAL LOW (ref 4.0–10.5)
nRBC: 0 % (ref 0.0–0.2)

## 2022-11-25 LAB — SAMPLE TO BLOOD BANK

## 2022-11-25 LAB — PREPARE RBC (CROSSMATCH)

## 2022-11-25 MED ORDER — HEPARIN SOD (PORK) LOCK FLUSH 100 UNIT/ML IV SOLN
500.0000 [IU] | Freq: Every day | INTRAVENOUS | Status: AC | PRN
  Administered 2022-11-25: 500 [IU]

## 2022-11-25 MED ORDER — SODIUM CHLORIDE 0.9% IV SOLUTION
250.0000 mL | Freq: Once | INTRAVENOUS | Status: AC
  Administered 2022-11-25: 250 mL via INTRAVENOUS

## 2022-11-25 MED ORDER — SODIUM CHLORIDE 0.9% FLUSH
10.0000 mL | INTRAVENOUS | Status: AC | PRN
  Administered 2022-11-25: 10 mL

## 2022-11-25 MED ORDER — METHYLPREDNISOLONE SODIUM SUCC 40 MG IJ SOLR
40.0000 mg | Freq: Once | INTRAMUSCULAR | Status: AC
  Administered 2022-11-25: 40 mg via INTRAVENOUS
  Filled 2022-11-25: qty 1

## 2022-11-25 MED ORDER — ACETAMINOPHEN 325 MG PO TABS
650.0000 mg | ORAL_TABLET | Freq: Once | ORAL | Status: AC
  Administered 2022-11-25: 650 mg via ORAL
  Filled 2022-11-25: qty 2

## 2022-11-26 ENCOUNTER — Other Ambulatory Visit: Payer: Self-pay

## 2022-11-26 LAB — BPAM RBC
Blood Product Expiration Date: 202402112359
Blood Product Expiration Date: 202403032359
ISSUE DATE / TIME: 202402081032
ISSUE DATE / TIME: 202402081114
Unit Type and Rh: 6200
Unit Type and Rh: 6200

## 2022-11-26 LAB — TYPE AND SCREEN
ABO/RH(D): A POS
Antibody Screen: NEGATIVE
Unit division: 0
Unit division: 0

## 2022-11-27 ENCOUNTER — Other Ambulatory Visit: Payer: Self-pay

## 2022-11-29 ENCOUNTER — Inpatient Hospital Stay (HOSPITAL_BASED_OUTPATIENT_CLINIC_OR_DEPARTMENT_OTHER): Payer: Medicare Other | Admitting: Hematology

## 2022-11-29 ENCOUNTER — Inpatient Hospital Stay: Payer: Medicare Other

## 2022-11-29 ENCOUNTER — Other Ambulatory Visit: Payer: Self-pay

## 2022-11-29 ENCOUNTER — Encounter (INDEPENDENT_AMBULATORY_CARE_PROVIDER_SITE_OTHER): Payer: Medicare Other | Admitting: Ophthalmology

## 2022-11-29 ENCOUNTER — Encounter (INDEPENDENT_AMBULATORY_CARE_PROVIDER_SITE_OTHER): Payer: Self-pay

## 2022-11-29 VITALS — BP 103/53 | HR 72 | Resp 16

## 2022-11-29 DIAGNOSIS — D469 Myelodysplastic syndrome, unspecified: Secondary | ICD-10-CM

## 2022-11-29 DIAGNOSIS — D649 Anemia, unspecified: Secondary | ICD-10-CM

## 2022-11-29 DIAGNOSIS — Z5111 Encounter for antineoplastic chemotherapy: Secondary | ICD-10-CM | POA: Diagnosis not present

## 2022-11-29 LAB — COMPREHENSIVE METABOLIC PANEL
ALT: 20 U/L (ref 0–44)
AST: 12 U/L — ABNORMAL LOW (ref 15–41)
Albumin: 3.9 g/dL (ref 3.5–5.0)
Alkaline Phosphatase: 100 U/L (ref 38–126)
Anion gap: 4 — ABNORMAL LOW (ref 5–15)
BUN: 19 mg/dL (ref 8–23)
CO2: 28 mmol/L (ref 22–32)
Calcium: 9 mg/dL (ref 8.9–10.3)
Chloride: 105 mmol/L (ref 98–111)
Creatinine, Ser: 0.84 mg/dL (ref 0.61–1.24)
GFR, Estimated: >60 mL/min (ref >60)
Glucose, Bld: 98 mg/dL (ref 70–99)
Potassium: 4.5 mmol/L (ref 3.5–5.1)
Sodium: 137 mmol/L (ref 135–145)
Total Bilirubin: 0.6 mg/dL (ref 0.3–1.2)
Total Protein: 5.8 g/dL — ABNORMAL LOW (ref 6.5–8.1)

## 2022-11-29 LAB — CBC WITH DIFFERENTIAL/PLATELET
Abs Immature Granulocytes: 0.29 10*3/uL — ABNORMAL HIGH (ref 0.00–0.07)
Basophils Absolute: 0.2 10*3/uL — ABNORMAL HIGH (ref 0.0–0.1)
Basophils Relative: 5 %
Eosinophils Absolute: 0.5 10*3/uL (ref 0.0–0.5)
Eosinophils Relative: 11 %
HCT: 22.7 % — ABNORMAL LOW (ref 39.0–52.0)
Hemoglobin: 7.6 g/dL — ABNORMAL LOW (ref 13.0–17.0)
Immature Granulocytes: 6 %
Lymphocytes Relative: 14 %
Lymphs Abs: 0.7 10*3/uL (ref 0.7–4.0)
MCH: 29.2 pg (ref 26.0–34.0)
MCHC: 33.5 g/dL (ref 30.0–36.0)
MCV: 87.3 fL (ref 80.0–100.0)
Monocytes Absolute: 0.4 10*3/uL (ref 0.1–1.0)
Monocytes Relative: 7 %
Neutro Abs: 2.8 10*3/uL (ref 1.7–7.7)
Neutrophils Relative %: 57 %
Platelets: 644 10*3/uL — ABNORMAL HIGH (ref 150–400)
RBC: 2.6 MIL/uL — ABNORMAL LOW (ref 4.22–5.81)
RDW: 15.3 % (ref 11.5–15.5)
WBC: 4.9 10*3/uL (ref 4.0–10.5)
nRBC: 0 % (ref 0.0–0.2)

## 2022-11-29 LAB — PREPARE RBC (CROSSMATCH)

## 2022-11-29 MED ORDER — HEPARIN SOD (PORK) LOCK FLUSH 100 UNIT/ML IV SOLN
500.0000 [IU] | Freq: Once | INTRAVENOUS | Status: AC | PRN
  Administered 2022-11-29: 500 [IU]

## 2022-11-29 MED ORDER — SODIUM CHLORIDE 0.9% FLUSH
10.0000 mL | INTRAVENOUS | Status: DC | PRN
  Administered 2022-11-29: 10 mL

## 2022-11-29 MED ORDER — SODIUM CHLORIDE 0.9 % IV SOLN
75.0000 mg/m2 | Freq: Once | INTRAVENOUS | Status: AC
  Administered 2022-11-29: 141 mg via INTRAVENOUS
  Filled 2022-11-29: qty 14.1

## 2022-11-29 MED ORDER — ONDANSETRON HCL 8 MG PO TABS
8.0000 mg | ORAL_TABLET | Freq: Once | ORAL | Status: AC
  Administered 2022-11-29: 8 mg via ORAL
  Filled 2022-11-29: qty 1

## 2022-11-29 MED ORDER — SODIUM CHLORIDE 0.9 % IV SOLN: Freq: Once | INTRAVENOUS | Status: AC

## 2022-11-29 MED ORDER — SODIUM CHLORIDE 0.9% FLUSH
10.0000 mL | Freq: Once | INTRAVENOUS | Status: AC
  Administered 2022-11-29: 10 mL

## 2022-11-29 NOTE — Progress Notes (Signed)
HEMATOLOGY/ONCOLOGY PROGRESS NOTE:   Date of Service: 11/29/22  Patient Care Team: Lurline Del, DO as PCP - General (Family Medicine)  CHIEF COMPLAINTS:  Follow-up for continued evaluation and management of JAK2 positive myeloproliferative neoplasm/MDS  INTERVAL HISTORY:  Fernando Lee is a 77 y.o. male here for continued evaluation and management of his MPN/MDS with refractory anemia and thrombocytosis. He is here to start cycle 4 of Vidaza.  Patient was last seen by me 11/01/2022 with the patient and complains of spleen discomfort, mild constipation, mild lightheadedness, mild bilateral ankle swelling.   Patient reports he has been doing well overall without any new medical concerns since our last visit. Patient's last blood transfusion was last Thursday, 11/26/2022.   Patient tolerated his last cycle of Vidaza without any severe toxicities.   He complains of abdominal pain which worsens when he changes position while sleeping. He reports his abdominal pain is slowly improving.   He denies fever, chills, night sweats, unexpected weight loss, dizziness, abnormal bowel movement, back pain, chest pain, or leg swelling. He reports of mild bilateral ankle swelling.   Patient reports his last appointment with Dr. Joan Mayans was around 1 week ago. She recommended ultrasound or CT scan to see how treatment has been affecting the spleen.  Patient declines of receiving influenza vaccine.   Patient's wife notes that the patient get nauseous when he gets 2 units of blood transfusion.    MEDICAL HISTORY:  Past Medical History:  Diagnosis Date   Arthritis of right knee    Cervicalgia    Chest discomfort    normal stress echo   Chest pain    Colon polyps    Dyslipidemia    Fluttering sensation of heart    GERD without esophagitis    Hematochezia    Hyperglycemia    Hyperlipidemia    Internal hemorrhoids    Kidney stones    Male erectile dysfunction, unspecified    Mixed  dyslipidemia    Mixed hyperlipidemia    Ocular migraine    PAC (premature atrial contraction)    Personal history of colonic polyps    Prediabetes    Residual hemorrhoidal skin tags    Spleen enlarged    nov 2023 hospitalized   Unspecified hemorrhoids     SURGICAL HISTORY: Past Surgical History:  Procedure Laterality Date   BONE MARROW BIOPSY     IR IMAGING GUIDED PORT INSERTION  09/14/2022   KIDNEY STONE SURGERY     retrieval    SOCIAL HISTORY: Social History   Socioeconomic History   Marital status: Married    Spouse name: Not on file   Number of children: Not on file   Years of education: Not on file   Highest education level: Not on file  Occupational History   Not on file  Tobacco Use   Smoking status: Former    Types: Cigarettes   Smokeless tobacco: Never  Substance and Sexual Activity   Alcohol use: Yes    Alcohol/week: 1.0 standard drink of alcohol    Types: 1 Cans of beer per week   Drug use: Not on file   Sexual activity: Not on file  Other Topics Concern   Not on file  Social History Narrative   Not on file   Social Determinants of Health   Financial Resource Strain: Not on file  Food Insecurity: Unknown (08/31/2022)   Hunger Vital Sign    Worried About Running Out of Food in the Last  Year: Never true    [place] in the Last Year: Not on file  Transportation Needs: No Transportation Needs (08/31/2022)   PRAPARE - Hydrologist (Medical): No    Lack of Transportation (Non-Medical): No  Physical Activity: Not on file  Stress: Not on file  Social Connections: Not on file  Intimate Partner Violence: Not on file    FAMILY HISTORY: Family History  Problem Relation Age of Onset   Stroke Brother    Congestive Heart Failure Brother     ALLERGIES:  has No Known Allergies.  MEDICATIONS:  Current Outpatient Medications  Medication Sig Dispense Refill   ALFUZOSIN HCL ER PO Take 1 tablet by mouth daily after  supper.     aspirin EC 81 MG tablet Take 81 mg by mouth daily. Swallow whole.     B Complex Vitamins (B COMPLEX PO) Take 1 tablet by mouth daily.     Cholecalciferol (VITAMIN D3) 50 MCG (2000 UT) TABS Take 2,000 Units by mouth daily.     docusate sodium (COLACE) 100 MG capsule Take 100 mg by mouth 2 (two) times daily.     FIBER PO Take 1 capsule by mouth daily. Unknown strength     fish oil-omega-3 fatty acids 1000 MG capsule Take 1 g by mouth 2 (two) times daily. Strength 357m/1500mg     Flaxseed, Linseed, (FLAXSEED OIL) 1000 MG CAPS Take 1,000 mg by mouth daily.     GLUCOSAMINE CHONDROITIN MSM PO Take 1 tablet by mouth daily. Strength 1500/1500     lidocaine-prilocaine (EMLA) cream Apply to affected area once (Patient taking differently: Apply 1 Application topically once. Apply to affected area once) 30 g 3   Misc Natural Products (PROSTATE SUPPORT PO) Take 1 capsule by mouth daily. Unknown strength     Multiple Vitamin (MULTIVITAMIN) tablet Take 1 tablet by mouth daily. Unknown strength     ondansetron (ZOFRAN) 8 MG tablet Take 1 tablet (8 mg total) by mouth every 8 (eight) hours as needed for nausea or vomiting. 30 tablet 1   prochlorperazine (COMPAZINE) 10 MG tablet Take 1 tablet (10 mg total) by mouth every 6 (six) hours as needed for nausea or vomiting. 30 tablet 1   psyllium (METAMUCIL) 58.6 % powder Take 1 packet by mouth daily.     senna-docusate (SENNA S) 8.6-50 MG tablet Take 2 tablets by mouth at bedtime. 60 tablet 1   traMADol (ULTRAM) 50 MG tablet Take 50 mg by mouth as needed for moderate pain or severe pain.     Turmeric (QC TUMERIC COMPLEX PO) Take 750 mg by mouth daily.     vitamin C (ASCORBIC ACID) 250 MG tablet Take 500 mg by mouth daily.     No current facility-administered medications for this visit.    REVIEW OF SYSTEMS:   10 Point review of Systems was done is negative except as noted above.   PHYSICAL EXAMINATION: .BP (!) 104/58 (BP Location: Left Arm, Patient  Position: Sitting)   Pulse 73   Temp 97.9 F (36.6 C) (Temporal)   Resp 14   Wt 166 lb 4.8 oz (75.4 kg)   SpO2 98%   BMI 26.05 kg/m  NAD GENERAL:alert, in no acute distress and comfortable SKIN: no acute rashes, no significant lesions EYES: conjunctiva are pink and non-injected, sclera anicteric NECK: supple, no JVD LYMPH:  no palpable lymphadenopathy in the cervical, axillary or inguinal regions LUNGS: clear to auscultation b/l with normal respiratory  effort HEART: regular rate & rhythm ABDOMEN:  normoactive bowel sounds , non tender, not distended. Splenomegaly  Extremity: no pedal edema PSYCH: alert & oriented x 3 with fluent speech NEURO: no focal motor/sensory deficits  LABORATORY DATA:  I have reviewed the data as listed .    Latest Ref Rng & Units 11/29/2022   11:53 AM 11/25/2022    9:14 AM 11/19/2022    9:35 AM  CBC  WBC 4.0 - 10.5 K/uL 4.9  3.9  6.0   Hemoglobin 13.0 - 17.0 g/dL 7.6  7.0  8.0   Hematocrit 39.0 - 52.0 % 22.7  20.4  23.3   Platelets 150 - 400 K/uL 644  809  599      Iron/TIBC/Ferritin/ %Sat    Component Value Date/Time   IRON 180 09/30/2022 0924   TIBC 193 (L) 09/30/2022 0924   FERRITIN 1,450 (H) 09/30/2022 0924   IRONPCTSAT 93 (H) 09/30/2022 0924  .[name] Kitchen    Latest Ref Rng & Units 11/29/2022   11:53 AM 11/18/2022    3:08 PM 11/01/2022    9:18 AM  CMP  Glucose 70 - 99 mg/dL 98  192  153   BUN 8 - 23 mg/dL 19  17  21   $ Creatinine 0.61 - 1.24 mg/dL 0.84  0.81  0.81   Sodium 135 - 145 mmol/L 137  135  136   Potassium 3.5 - 5.1 mmol/L 4.5  4.7  4.5   Chloride 98 - 111 mmol/L 105  104  105   CO2 22 - 32 mmol/L 28  25  28   $ Calcium 8.9 - 10.3 mg/dL 9.0  8.3  9.0   Total Protein 6.5 - 8.1 g/dL 5.8  5.9  5.5   Total Bilirubin 0.3 - 1.2 mg/dL 0.6  1.1  0.6   Alkaline Phos 38 - 126 U/L 100  92  73   AST 15 - 41 U/L 12  9  12   $ ALT 0 - 44 U/L 20  13  16    $ . Lab Results  Component Value Date   LDH 110 01/04/2022    02/23/2021 BCR ABL     02/23/2021 JAK2   Surgical Pathology  CASE: [id number]  PATIENT: Fernando Lee  Bone Marrow Report  Clinical History: MPN with progressive anemia  (BH)  DIAGNOSIS:   BONE MARROW, ASPIRATE, CLOT, CORE:  -Hypercellular bone marrow with features of myeloid neoplasm  -See comment   PERIPHERAL BLOOD:  -Macrocytic anemia  -Leukocytosis  -Thrombocytosis   COMMENT:   The bone marrow/peripheral blood show persistent involvement by  previously known myeloid neoplasm.  There is a myeloproliferative  component as supported by previous JAK2 positivity.  However, there are  also dyspoietic changes primarily involving the megakaryocytic cell line  with numerous hypolobated/unilobated forms in addition to  dysgranulopoiesis to a lesser extent.  This is associated with  eosinophilia.  It is not entirely clear whether the overall findings  represent a myeloproliferative neoplasm with treatment related changes  or represent a primary myeloproliferative/myelodysplastic neoplasm  including but not limited to myeloid neoplasms with eosinophilia.  Correlation with cytogenetic and FISH studies strongly recommended.      RADIOGRAPHIC STUDIES: I have personally reviewed the radiological images as listed and agreed with the findings in the report. No results found.   IR IMAGING GUIDED PORT INSERTION  Result Date: 09/14/2022 INDICATION: Poor IV access.  Myelodysplastic syndrome EXAM: IMPLANTED PORT A CATH PLACEMENT WITH ULTRASOUND AND FLUOROSCOPIC GUIDANCE MEDICATIONS:  None ANESTHESIA/SEDATION: Moderate (conscious) sedation was employed during this procedure. A total of Versed 3 mg and Fentanyl 100 mcg was administered intravenously. Moderate Sedation Time: 20 minutes. The patient's level of consciousness and vital signs were monitored continuously by radiology nursing throughout the procedure under my direct supervision. FLUOROSCOPY TIME:  Fluoroscopic dose; 1 mGy COMPLICATIONS: None  immediate. PROCEDURE: The procedure, risks, benefits, and alternatives were explained to the patient. Questions regarding the procedure were encouraged and answered. The patient understands and consents to the procedure. The RIGHT neck and chest were prepped with chlorhexidine in a sterile fashion, and a sterile drape was applied covering the operative field. Maximum barrier sterile technique with sterile gowns and gloves were used for the procedure. A timeout was performed prior to the initiation of the procedure. Local anesthesia was provided with 1% lidocaine with epinephrine. After creating a small venotomy incision, a micropuncture kit was utilized to access the internal jugular vein under direct, real-time ultrasound guidance. Ultrasound image documentation was performed. The microwire was kinked to measure appropriate catheter length. A subcutaneous port pocket was then created along the upper chest wall utilizing a combination of sharp and blunt dissection. The pocket was irrigated with sterile saline. A single lumen ISP power injectable port was chosen for placement. The 8 Fr catheter was tunneled from the port pocket site to the venotomy incision. The port was placed in the pocket. The external catheter was trimmed to appropriate length. At the venotomy, an 8 Fr peel-away sheath was placed over a guidewire under fluoroscopic guidance. The catheter was then placed through the sheath and the sheath was removed. Final catheter positioning was confirmed and documented with a fluoroscopic spot radiograph. The port was accessed with a Huber needle, aspirated and flushed with heparinized saline. The port pocket incision was closed with interrupted 3-0 Vicryl suture then Dermabond was applied, including at the venotomy incision. Dressings were placed. The patient tolerated the procedure well without immediate post procedural complication. IMPRESSION: Successful placement of a RIGHT internal jugular approach power  injectable Port-A-Cath. The tip of the catheter is positioned within the proximal RIGHT atrium. The catheter is ready for immediate use. Michaelle Birks, MD [facility] Electronically Signed   By: Michaelle Birks M.D.   On: 09/14/2022 17:18   CT ANGIO GI BLEED  Result Date: 08/31/2022 CLINICAL DATA:  Acute mesenteric ischemia. Abdominal pain. History of myelodysplastic syndrome. EXAM: CTA ABDOMEN AND PELVIS WITHOUT AND WITH CONTRAST TECHNIQUE: Multidetector CT imaging of the abdomen and pelvis was performed using the standard protocol during bolus administration of intravenous contrast. Multiplanar reconstructed images and MIPs were obtained and reviewed to evaluate the vascular anatomy. RADIATION DOSE REDUCTION: This exam was performed according to the departmental dose-optimization program which includes automated exposure control, adjustment of the mA and/or kV according to patient size and/or use of iterative reconstruction technique. CONTRAST:  167m OMNIPAQUE IOHEXOL 350 MG/ML SOLN COMPARISON:  Abdominal ultrasound examination 04/03/2021 FINDINGS: VASCULAR Aorta: Normal caliber abdominal aorta. Scattered atherosclerotic calcifications. No dissection. Celiac: Normal SMA: Normal Renals: Normal IMA: Patent Inflow: Normal Proximal Outflow: Normal Veins: Normal Review of the MIP images confirms the above findings. NON-VASCULAR Lower chest: Small left pleural effusion and bibasilar atelectasis. The heart is normal in size. No pericardial effusion. There is a moderate to large hiatal hernia. Hepatobiliary: No hepatic lesions or intrahepatic biliary dilatation. The gallbladder is unremarkable. No common bile duct dilatation. Pancreas: No mass, inflammation or ductal dilatation. Spleen: Massive splenomegaly. The spleen  measures 20 x 16 x 11 cm. No splenic lesions or splenic infarct. Adrenals/Urinary Tract: The left kidney is displaced medially and inferiorly by  the enlarged spleen. No worrisome renal lesions,, hydronephrosis or pyelonephritis. There is a lower pole right renal calculus and there are 2 distal left ureteral calculi more proximal calculus measures 4 mm and the more distal calculus measures 5 mm. I do not see any hydroureter or hydronephrosis. Stomach/Bowel: The stomach, duodenum, small bowel and colon are grossly normal. No inflammatory changes, mass lesions or obstructive findings. Lymphatic: No abdominal or pelvic lymphadenopathy. Reproductive: The prostate gland is enlarged. The seminal vesicles are unremarkable. Other: No pelvic mass or adenopathy. No free pelvic fluid collections. No inguinal mass or adenopathy. No abdominal wall hernia or subcutaneous lesions. Musculoskeletal: No significant bony findings. IMPRESSION: 1. Normal caliber abdominal aorta and no dissection. The branch vessels are normal. 2. Splenomegaly. 3. Two distal left ureteral calculi but no hydroureter or hydronephrosis. 4. Lower pole right renal calculus. 5. Moderate to large hiatal hernia. 6. Small left pleural effusion and bibasilar atelectasis. Electronically Signed   By: Marijo Sanes M.D.   On: 08/31/2022 18:13     ASSESSMENT & PLAN:    #1 JAK2-positive myeloproliferative neoplasm-primarily presenting with thrombocytosis and associated MDS causing refractory anemia -No polycythemia.  -Mild leukocytosis.  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 Has grade 1 out of 3 reticulin fibrosis.  Uncertain if this is primary or secondary. -LDH has remained within normal limits. 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 -CT Angio GI bleed scan from 09/01/2022 showed kidney stones.and significant splenomegaly  PLAN: -Discussed lab results from today, 11/29/2022, with the patient. CBC shows decreased hemoglobin of 7.6, hematocrit of 22.7, and elevated platelets of 644. CMP is stable. Patient is anemic.  -Patient does want blood  transfusion today and will wait till Thursday.  -discussed the option of bone marrow scan/biopsy between cycle 6-8 and get an ultrasound to see spleen size.  -Patient agrees with ultrasound of spleen.  -patient is tolerating his Vidaza treatment well without any toxicities.  -Patient can proceed with Cycle 4 of Vidaza without any dose modification.  -answered all of patient's and his wife's questions about his MDS/MDN and treatment.  -Continue monitoring weekly labs and transfuse as needed for hemoglobin less than 7.5. 1 blood unit weekly as needed.    Follow-up:  Per integrated scheduling for Vidaza Weekly labs and PRBC transfusion if needed   The total time spent in the appointment was 30 minutes* .  All of the patient's questions were answered with apparent satisfaction. The patient knows to call the clinic with any problems, questions or concerns.   Sullivan Lone MD MS AAHIVMS [facility] Hematology/Oncology Physician [facility]  .*Total Encounter Time as defined by the Centers for Medicare and Medicaid Services includes, in addition to the face-to-face time of a patient visit (documented in the note above) non-face-to-face time: obtaining and reviewing outside history, ordering and reviewing medications, tests or procedures, care coordination (communications with other health care professionals or caregivers) and documentation in the medical record.   Zettie Cooley, am acting as a Education administrator for Sullivan Lone, MD

## 2022-11-29 NOTE — Patient Instructions (Signed)
[facility]  Discharge Instructions: Thank you for choosing [facility] to provide your oncology and hematology care.   If you have a lab appointment with the Cancer Center, please go directly to the Cancer Center and check in at the registration area.   Wear comfortable clothing and clothing appropriate for easy access to any Portacath or PICC line.   We strive to give you quality time with your provider. You may need to reschedule your appointment if you arrive late (15 or more minutes).  Arriving late affects you and other patients whose appointments are after yours.  Also, if you miss three or more appointments without notifying the office, you may be dismissed from the clinic at the provider's discretion.      For prescription refill requests, have your pharmacy contact our office and allow 72 hours for refills to be completed.    Today you received the following chemotherapy and/or immunotherapy agents Vidaza      To help prevent nausea and vomiting after your treatment, we encourage you to take your nausea medication as directed.  BELOW ARE SYMPTOMS THAT SHOULD BE REPORTED IMMEDIATELY: *FEVER GREATER THAN 100.4 F (38 C) OR HIGHER *CHILLS OR SWEATING *NAUSEA AND VOMITING THAT IS NOT CONTROLLED WITH YOUR NAUSEA MEDICATION *UNUSUAL SHORTNESS OF BREATH *UNUSUAL BRUISING OR BLEEDING *URINARY PROBLEMS (pain or burning when urinating, or frequent urination) *BOWEL PROBLEMS (unusual diarrhea, constipation, pain near the anus) TENDERNESS IN MOUTH AND THROAT WITH OR WITHOUT PRESENCE OF ULCERS (sore throat, sores in mouth, or a toothache) UNUSUAL RASH, SWELLING OR PAIN  UNUSUAL VAGINAL DISCHARGE OR ITCHING   Items with * indicate a potential emergency and should be followed up as soon as possible or go to the Emergency Department if any problems should occur.  Please show the CHEMOTHERAPY ALERT CARD or IMMUNOTHERAPY ALERT CARD at check-in  to the Emergency Department and triage nurse.  Should you have questions after your visit or need to cancel or reschedule your appointment, please contact [facility]  Dept: [phone]  and follow the prompts.  Office hours are 8:00 a.m. to 4:30 p.m. Monday - Friday. Please note that voicemails left after 4:00 p.m. may not be returned until the following business day.  We are closed weekends and major holidays. You have access to a nurse at all times for urgent questions. Please call the main number to the clinic Dept: [phone] and follow the prompts.   For any non-urgent questions, you may also contact your provider using MyChart. We now offer e-Visits for anyone 18 and older to request care online for non-urgent symptoms. For details visit [email or web address].   Also download the MyChart app! Go to the app store, search "MyChart", open the app, select [facility], and log in with your MyChart username and password.   

## 2022-11-29 NOTE — Progress Notes (Signed)
Patient seen by MD today  Vitals are within treatment parameters./ BP 104/58 MD aware  Labs reviewed: ,"and are not all within treatment parameters. Dr Irene Limbo aware pt's HGB is 7.6/ pt will get blood on Thursday.   Per physician team, patient is ready for treatment and there are NO modifications to the treatment plan.

## 2022-11-30 ENCOUNTER — Other Ambulatory Visit: Payer: Self-pay

## 2022-11-30 ENCOUNTER — Inpatient Hospital Stay: Payer: Medicare Other

## 2022-11-30 VITALS — BP 109/58 | HR 71 | Temp 97.8°F | Resp 18

## 2022-11-30 DIAGNOSIS — D469 Myelodysplastic syndrome, unspecified: Secondary | ICD-10-CM | POA: Diagnosis not present

## 2022-11-30 DIAGNOSIS — Z5111 Encounter for antineoplastic chemotherapy: Secondary | ICD-10-CM | POA: Diagnosis not present

## 2022-11-30 DIAGNOSIS — D649 Anemia, unspecified: Secondary | ICD-10-CM

## 2022-11-30 MED ORDER — HEPARIN SOD (PORK) LOCK FLUSH 100 UNIT/ML IV SOLN
500.0000 [IU] | Freq: Once | INTRAVENOUS | Status: AC | PRN
  Administered 2022-11-30: 500 [IU]

## 2022-11-30 MED ORDER — SODIUM CHLORIDE 0.9% FLUSH
10.0000 mL | INTRAVENOUS | Status: DC | PRN
  Administered 2022-11-30: 10 mL

## 2022-11-30 MED ORDER — SODIUM CHLORIDE 0.9 % IV SOLN
75.0000 mg/m2 | Freq: Once | INTRAVENOUS | Status: AC
  Administered 2022-11-30: 141 mg via INTRAVENOUS
  Filled 2022-11-30: qty 14.1

## 2022-11-30 MED ORDER — ONDANSETRON HCL 8 MG PO TABS: 8.0000 mg | ORAL_TABLET | Freq: Once | ORAL | Status: DC

## 2022-11-30 MED ORDER — SODIUM CHLORIDE 0.9 % IV SOLN: Freq: Once | INTRAVENOUS | Status: AC

## 2022-11-30 NOTE — Progress Notes (Signed)
Pt informed this RN that he took his Zofran at home PO prior to appointment today.

## 2022-11-30 NOTE — Patient Instructions (Signed)
[facility]  Discharge Instructions: Thank you for choosing [facility] to provide your oncology and hematology care.   If you have a lab appointment with the Cancer Center, please go directly to the Cancer Center and check in at the registration area.   Wear comfortable clothing and clothing appropriate for easy access to any Portacath or PICC line.   We strive to give you quality time with your provider. You may need to reschedule your appointment if you arrive late (15 or more minutes).  Arriving late affects you and other patients whose appointments are after yours.  Also, if you miss three or more appointments without notifying the office, you may be dismissed from the clinic at the provider's discretion.      For prescription refill requests, have your pharmacy contact our office and allow 72 hours for refills to be completed.    Today you received the following chemotherapy and/or immunotherapy agents Vidaza      To help prevent nausea and vomiting after your treatment, we encourage you to take your nausea medication as directed.  BELOW ARE SYMPTOMS THAT SHOULD BE REPORTED IMMEDIATELY: *FEVER GREATER THAN 100.4 F (38 C) OR HIGHER *CHILLS OR SWEATING *NAUSEA AND VOMITING THAT IS NOT CONTROLLED WITH YOUR NAUSEA MEDICATION *UNUSUAL SHORTNESS OF BREATH *UNUSUAL BRUISING OR BLEEDING *URINARY PROBLEMS (pain or burning when urinating, or frequent urination) *BOWEL PROBLEMS (unusual diarrhea, constipation, pain near the anus) TENDERNESS IN MOUTH AND THROAT WITH OR WITHOUT PRESENCE OF ULCERS (sore throat, sores in mouth, or a toothache) UNUSUAL RASH, SWELLING OR PAIN  UNUSUAL VAGINAL DISCHARGE OR ITCHING   Items with * indicate a potential emergency and should be followed up as soon as possible or go to the Emergency Department if any problems should occur.  Please show the CHEMOTHERAPY ALERT CARD or IMMUNOTHERAPY ALERT CARD at check-in  to the Emergency Department and triage nurse.  Should you have questions after your visit or need to cancel or reschedule your appointment, please contact  [facility]  Dept: [phone]  and follow the prompts.  Office hours are 8:00 a.m. to 4:30 p.m. Monday - Friday. Please note that voicemails left after 4:00 p.m. may not be returned until the following business day.  We are closed weekends and major holidays. You have access to a nurse at all times for urgent questions. Please call the main number to the clinic Dept: [phone] and follow the prompts.   For any non-urgent questions, you may also contact your provider using MyChart. We now offer e-Visits for anyone 18 and older to request care online for non-urgent symptoms. For details visit [email or web address].   Also download the MyChart app! Go to the app store, search "MyChart", open the app, select , and log in with your MyChart username and password.   

## 2022-12-01 ENCOUNTER — Inpatient Hospital Stay: Payer: Medicare Other

## 2022-12-01 VITALS — BP 100/52 | HR 68 | Temp 97.8°F | Resp 18 | Wt 166.2 lb

## 2022-12-01 DIAGNOSIS — D469 Myelodysplastic syndrome, unspecified: Secondary | ICD-10-CM | POA: Diagnosis not present

## 2022-12-01 DIAGNOSIS — Z5111 Encounter for antineoplastic chemotherapy: Secondary | ICD-10-CM | POA: Diagnosis not present

## 2022-12-01 DIAGNOSIS — D649 Anemia, unspecified: Secondary | ICD-10-CM

## 2022-12-01 MED ORDER — SODIUM CHLORIDE 0.9% FLUSH
10.0000 mL | INTRAVENOUS | Status: DC | PRN
  Administered 2022-12-01: 10 mL

## 2022-12-01 MED ORDER — SODIUM CHLORIDE 0.9 % IV SOLN
75.0000 mg/m2 | Freq: Once | INTRAVENOUS | Status: AC
  Administered 2022-12-01: 141 mg via INTRAVENOUS
  Filled 2022-12-01: qty 14.1

## 2022-12-01 MED ORDER — SODIUM CHLORIDE 0.9 % IV SOLN: Freq: Once | INTRAVENOUS | Status: AC

## 2022-12-01 MED ORDER — HEPARIN SOD (PORK) LOCK FLUSH 100 UNIT/ML IV SOLN
500.0000 [IU] | Freq: Once | INTRAVENOUS | Status: AC | PRN
  Administered 2022-12-01: 500 [IU]

## 2022-12-01 NOTE — Progress Notes (Signed)
Pt reports taking Zofran around 0730 today prior to arrival for infusion

## 2022-12-02 ENCOUNTER — Inpatient Hospital Stay: Payer: Medicare Other

## 2022-12-02 VITALS — BP 94/48 | HR 66 | Temp 97.7°F | Resp 16

## 2022-12-02 DIAGNOSIS — D469 Myelodysplastic syndrome, unspecified: Secondary | ICD-10-CM

## 2022-12-02 DIAGNOSIS — Z5111 Encounter for antineoplastic chemotherapy: Secondary | ICD-10-CM | POA: Diagnosis not present

## 2022-12-02 DIAGNOSIS — D649 Anemia, unspecified: Secondary | ICD-10-CM

## 2022-12-02 MED ORDER — SODIUM CHLORIDE 0.9 % IV SOLN: Freq: Once | INTRAVENOUS | Status: AC

## 2022-12-02 MED ORDER — ONDANSETRON HCL 8 MG PO TABS: 8.0000 mg | ORAL_TABLET | Freq: Once | ORAL | Status: DC

## 2022-12-02 MED ORDER — SODIUM CHLORIDE 0.9 % IV SOLN
75.0000 mg/m2 | Freq: Once | INTRAVENOUS | Status: AC
  Administered 2022-12-02: 141 mg via INTRAVENOUS
  Filled 2022-12-02: qty 14.1

## 2022-12-02 MED ORDER — SODIUM CHLORIDE 0.9% FLUSH
10.0000 mL | INTRAVENOUS | Status: DC | PRN
  Administered 2022-12-02: 10 mL

## 2022-12-02 MED ORDER — ACETAMINOPHEN 325 MG PO TABS
650.0000 mg | ORAL_TABLET | Freq: Once | ORAL | Status: AC
  Administered 2022-12-02: 650 mg via ORAL
  Filled 2022-12-02: qty 2

## 2022-12-02 MED ORDER — METHYLPREDNISOLONE SODIUM SUCC 40 MG IJ SOLR
40.0000 mg | Freq: Once | INTRAMUSCULAR | Status: AC
  Administered 2022-12-02: 40 mg via INTRAVENOUS
  Filled 2022-12-02: qty 1

## 2022-12-02 MED ORDER — SODIUM CHLORIDE 0.9% IV SOLUTION
250.0000 mL | Freq: Once | INTRAVENOUS | Status: AC
  Administered 2022-12-02: 250 mL via INTRAVENOUS

## 2022-12-02 MED ORDER — HEPARIN SOD (PORK) LOCK FLUSH 100 UNIT/ML IV SOLN
500.0000 [IU] | Freq: Once | INTRAVENOUS | Status: AC | PRN
  Administered 2022-12-02: 500 [IU]

## 2022-12-02 NOTE — Patient Instructions (Signed)
[facility]  Discharge Instructions: Thank you for choosing [facility] to provide your oncology and hematology care.   If you have a lab appointment with the [facility], please go directly to the [facility] and check in at the registration area.   Wear comfortable clothing and clothing appropriate for easy access to any Portacath or PICC line.   We strive to give you quality time with your provider. You may need to reschedule your appointment if you arrive late (15 or more minutes).  Arriving late affects you and other patients whose appointments are after yours.  Also, if you miss three or more appointments without notifying the office, you may be dismissed from the clinic at the provider's discretion.      For prescription refill requests, have your pharmacy contact our office and allow 72 hours for refills to be completed.    Today you received the following chemotherapy and/or immunotherapy agents: Vidaza      To help prevent nausea and vomiting after your treatment, we encourage you to take your nausea medication as directed.  BELOW ARE SYMPTOMS THAT SHOULD BE REPORTED IMMEDIATELY: *FEVER GREATER THAN 100.4 F (38 C) OR HIGHER *CHILLS OR SWEATING *NAUSEA AND VOMITING THAT IS NOT CONTROLLED WITH YOUR NAUSEA MEDICATION *UNUSUAL SHORTNESS OF BREATH *UNUSUAL BRUISING OR BLEEDING *URINARY PROBLEMS (pain or burning when urinating, or frequent urination) *BOWEL PROBLEMS (unusual diarrhea, constipation, pain near the anus) TENDERNESS IN MOUTH AND THROAT WITH OR WITHOUT PRESENCE OF ULCERS (sore throat, sores in mouth, or a toothache) UNUSUAL RASH, SWELLING OR PAIN  UNUSUAL VAGINAL DISCHARGE OR ITCHING   Items with * indicate a potential emergency and should be followed up as soon as possible or go to the Emergency Department if any problems should occur.  Please show the CHEMOTHERAPY ALERT CARD or IMMUNOTHERAPY ALERT CARD at check-in  to the Emergency Department and triage nurse.  Should you have questions after your visit or need to cancel or reschedule your appointment, please contact [facility]  Dept: [phone]  and follow the prompts.  Office hours are 8:00 a.m. to 4:30 p.m. Monday - Friday. Please note that voicemails left after 4:00 p.m. may not be returned until the following business day.  We are closed weekends and major holidays. You have access to a nurse at all times for urgent questions. Please call the main number to the clinic Dept: [phone] and follow the prompts.   For any non-urgent questions, you may also contact your provider using MyChart. We now offer e-Visits for anyone 30 and older to request care online for non-urgent symptoms. For details visit [email or web address].   Also download the MyChart app! Go to the app store, search "MyChart", open the app, select [facility], and log in with your MyChart username and password.  Blood Transfusion, Adult A blood transfusion is a procedure in which you receive blood or a type of blood cell (blood component) through an IV. You may need a blood transfusion when you have a low blood count, which is a low number of any blood cell. This may result from a bleeding disorder, illness, injury, or surgery. The blood may come from a donor, or you may be able to have your own blood collected and stored (autologous blood donation) before a planned surgery. The blood given in a transfusion may be made up of different blood components. You may receive: Red blood cells. These  carry oxygen to the cells in the body. Platelets. These help your blood to clot. Plasma. This is the liquid part of your blood. It carries proteins and other substances throughout the body. White blood cells. These help you fight infections. If you have hemophilia or another clotting disorder, you may also receive other types of blood products. Depending on  the type of blood product, this procedure may take 1-4 hours to complete. Tell a health care provider about: Any bleeding problems you have. Any previous reactions you have had during a blood transfusion. Any allergies you have. All medicines you are taking, including vitamins, herbs, eye drops, creams, and over-the-counter medicines. Any surgeries you have had. Any medical conditions you have. Whether you are pregnant or may be pregnant. What are the risks? Talk with your health care provider about risks. The most common problems include: A mild allergic reaction, such as red, swollen areas of skin (hives) and itching. Fever or chills. This may be the body's response to new blood cells received. This may occur during or up to 4 hours after the transfusion. More serious problems may include: A serious allergic reaction that causes difficulty breathing or swelling around the face and lips. Transfusion-associated circulatory overload (TACO), or too much fluid in the lungs. This may cause breathing problems. Transfusion-related acute lung injury (TRALI), which causes breathing difficulty and low oxygen in the blood. This can occur within hours of the transfusion or several days later. Iron overload. This can happen after receiving many blood transfusions over a period of time. Infection or virus being transmitted. This is rare because donated blood is carefully tested before it is given. Hemolytic transfusion reaction. This is rare. It happens when the body's defense system (immune system)tries to attack the new blood cells. Symptoms may include fever, chills, nausea, low blood pressure, and low back or chest pain. Transfusion-associated graft-versus-host disease (TAGVHD). This is rare. It happens when donated cells attack the body's healthy tissues. What happens before the procedure? You will have a blood test to check your blood type. This test is done to know what kind of blood your body will  accept and to match it to the donor blood. If you are going to have a planned surgery, you may be able to do an autologous blood donation. This may be done in case you need to have a transfusion. You will have your temperature, blood pressure, and pulse checked before the transfusion. If you have had an allergic reaction to a transfusion in the past, you may be given medicine to help prevent a reaction. This medicine may be given to you by mouth (orally) or through an IV. What happens during the procedure?  An IV will be inserted into one of your veins. The bag of blood will be attached to your IV. The blood will then enter through your vein. Your temperature, blood pressure, and pulse will be monitored during the transfusion. This monitoring is done to detect early signs of a transfusion reaction. Tell your nurse right away if you have any of these symptoms during the transfusion: Shortness of breath or trouble breathing. Chest or back pain. Fever or chills. Itching or hives. If you have any signs or symptoms of a reaction, your transfusion will be stopped and you may be given medicine. When the transfusion is complete, your IV will be removed. Pressure may be applied to the IV site for a few minutes. A bandage (dressing)will be applied. The procedure may vary among health  care providers and hospitals. What happens after the procedure? Your temperature, blood pressure, pulse, breathing rate, and blood oxygen level will be monitored until you leave the hospital or clinic. Your blood may be tested to see how you have responded to the transfusion. You may be warmed with fluids or blankets to maintain a normal body temperature. If you receive your blood transfusion in an outpatient setting, you will be told whom to contact to report any reactions. Where to find more information Visit the American Red Cross: redcross.org Summary A blood transfusion is a procedure in which you receive blood or  a type of blood cell (blood component) through an IV. The blood given in a transfusion may be made up of different blood components. You may receive red blood cells, platelets, plasma, or white blood cells depending on the condition treated. Your temperature, blood pressure, and pulse will be monitored before, during, and after the transfusion. After the transfusion, your blood may be tested to see how your body has responded. This information is not intended to replace advice given to you by your health care provider. Make sure you discuss any questions you have with your health care provider. Document Revised: 01/01/2022 Document Reviewed: 01/01/2022 Elsevier Patient Education  [place].

## 2022-12-03 ENCOUNTER — Inpatient Hospital Stay: Payer: Medicare Other

## 2022-12-03 VITALS — BP 100/54 | HR 67 | Temp 97.7°F | Resp 16

## 2022-12-03 DIAGNOSIS — D649 Anemia, unspecified: Secondary | ICD-10-CM

## 2022-12-03 DIAGNOSIS — D469 Myelodysplastic syndrome, unspecified: Secondary | ICD-10-CM | POA: Diagnosis not present

## 2022-12-03 DIAGNOSIS — Z5111 Encounter for antineoplastic chemotherapy: Secondary | ICD-10-CM | POA: Diagnosis not present

## 2022-12-03 LAB — BPAM RBC
Blood Product Expiration Date: 202403082359
ISSUE DATE / TIME: 202402150939
Unit Type and Rh: 6200

## 2022-12-03 LAB — TYPE AND SCREEN
ABO/RH(D): A POS
Antibody Screen: NEGATIVE
Unit division: 0

## 2022-12-03 MED ORDER — SODIUM CHLORIDE 0.9 % IV SOLN: Freq: Once | INTRAVENOUS | Status: AC

## 2022-12-03 MED ORDER — SODIUM CHLORIDE 0.9% FLUSH
10.0000 mL | INTRAVENOUS | Status: DC | PRN
  Administered 2022-12-03: 10 mL

## 2022-12-03 MED ORDER — HEPARIN SOD (PORK) LOCK FLUSH 100 UNIT/ML IV SOLN
500.0000 [IU] | Freq: Once | INTRAVENOUS | Status: AC | PRN
  Administered 2022-12-03: 500 [IU]

## 2022-12-03 MED ORDER — SODIUM CHLORIDE 0.9 % IV SOLN
75.0000 mg/m2 | Freq: Once | INTRAVENOUS | Status: AC
  Administered 2022-12-03: 141 mg via INTRAVENOUS
  Filled 2022-12-03: qty 14.1

## 2022-12-03 NOTE — Patient Instructions (Signed)
[facility]  Discharge Instructions: Thank you for choosing [facility] to provide your oncology and hematology care.   If you have a lab appointment with the [facility], please go directly to the [facility] and check in at the registration area.   Wear comfortable clothing and clothing appropriate for easy access to any Portacath or PICC line.   We strive to give you quality time with your provider. You may need to reschedule your appointment if you arrive late (15 or more minutes).  Arriving late affects you and other patients whose appointments are after yours.  Also, if you miss three or more appointments without notifying the office, you may be dismissed from the clinic at the provider's discretion.      For prescription refill requests, have your pharmacy contact our office and allow 72 hours for refills to be completed.    Today you received the following chemotherapy and/or immunotherapy agents: Vidaza      To help prevent nausea and vomiting after your treatment, we encourage you to take your nausea medication as directed.  BELOW ARE SYMPTOMS THAT SHOULD BE REPORTED IMMEDIATELY: *FEVER GREATER THAN 100.4 F (38 C) OR HIGHER *CHILLS OR SWEATING *NAUSEA AND VOMITING THAT IS NOT CONTROLLED WITH YOUR NAUSEA MEDICATION *UNUSUAL SHORTNESS OF BREATH *UNUSUAL BRUISING OR BLEEDING *URINARY PROBLEMS (pain or burning when urinating, or frequent urination) *BOWEL PROBLEMS (unusual diarrhea, constipation, pain near the anus) TENDERNESS IN MOUTH AND THROAT WITH OR WITHOUT PRESENCE OF ULCERS (sore throat, sores in mouth, or a toothache) UNUSUAL RASH, SWELLING OR PAIN  UNUSUAL VAGINAL DISCHARGE OR ITCHING   Items with * indicate a potential emergency and should be followed up as soon as possible or go to the Emergency Department if any problems should occur.  Please show the CHEMOTHERAPY ALERT CARD or IMMUNOTHERAPY ALERT CARD at check-in  to the Emergency Department and triage nurse.  Should you have questions after your visit or need to cancel or reschedule your appointment, please contact [facility]  Dept: [phone]  and follow the prompts.  Office hours are 8:00 a.m. to 4:30 p.m. Monday - Friday. Please note that voicemails left after 4:00 p.m. may not be returned until the following business day.  We are closed weekends and major holidays. You have access to a nurse at all times for urgent questions. Please call the main number to the clinic Dept: [phone] and follow the prompts.   For any non-urgent questions, you may also contact your provider using MyChart. We now offer e-Visits for anyone 40 and older to request care online for non-urgent symptoms. For details visit [email or web address].   Also download the MyChart app! Go to the app store, search "MyChart", open the app, select [facility], and log in with your MyChart username and password.  Blood Transfusion, Adult A blood transfusion is a procedure in which you receive blood or a type of blood cell (blood component) through an IV. You may need a blood transfusion when you have a low blood count, which is a low number of any blood cell. This may result from a bleeding disorder, illness, injury, or surgery. The blood may come from a donor, or you may be able to have your own blood collected and stored (autologous blood donation) before a planned surgery. The blood given in a transfusion may be made up of different blood components. You may receive: Red blood cells. These  carry oxygen to the cells in the body. Platelets. These help your blood to clot. Plasma. This is the liquid part of your blood. It carries proteins and other substances throughout the body. White blood cells. These help you fight infections. If you have hemophilia or another clotting disorder, you may also receive other types of blood products. Depending on  the type of blood product, this procedure may take 1-4 hours to complete. Tell a health care provider about: Any bleeding problems you have. Any previous reactions you have had during a blood transfusion. Any allergies you have. All medicines you are taking, including vitamins, herbs, eye drops, creams, and over-the-counter medicines. Any surgeries you have had. Any medical conditions you have. Whether you are pregnant or may be pregnant. What are the risks? Talk with your health care provider about risks. The most common problems include: A mild allergic reaction, such as red, swollen areas of skin (hives) and itching. Fever or chills. This may be the body's response to new blood cells received. This may occur during or up to 4 hours after the transfusion. More serious problems may include: A serious allergic reaction that causes difficulty breathing or swelling around the face and lips. Transfusion-associated circulatory overload (TACO), or too much fluid in the lungs. This may cause breathing problems. Transfusion-related acute lung injury (TRALI), which causes breathing difficulty and low oxygen in the blood. This can occur within hours of the transfusion or several days later. Iron overload. This can happen after receiving many blood transfusions over a period of time. Infection or virus being transmitted. This is rare because donated blood is carefully tested before it is given. Hemolytic transfusion reaction. This is rare. It happens when the body's defense system (immune system)tries to attack the new blood cells. Symptoms may include fever, chills, nausea, low blood pressure, and low back or chest pain. Transfusion-associated graft-versus-host disease (TAGVHD). This is rare. It happens when donated cells attack the body's healthy tissues. What happens before the procedure? You will have a blood test to check your blood type. This test is done to know what kind of blood your body will  accept and to match it to the donor blood. If you are going to have a planned surgery, you may be able to do an autologous blood donation. This may be done in case you need to have a transfusion. You will have your temperature, blood pressure, and pulse checked before the transfusion. If you have had an allergic reaction to a transfusion in the past, you may be given medicine to help prevent a reaction. This medicine may be given to you by mouth (orally) or through an IV. What happens during the procedure?  An IV will be inserted into one of your veins. The bag of blood will be attached to your IV. The blood will then enter through your vein. Your temperature, blood pressure, and pulse will be monitored during the transfusion. This monitoring is done to detect early signs of a transfusion reaction. Tell your nurse right away if you have any of these symptoms during the transfusion: Shortness of breath or trouble breathing. Chest or back pain. Fever or chills. Itching or hives. If you have any signs or symptoms of a reaction, your transfusion will be stopped and you may be given medicine. When the transfusion is complete, your IV will be removed. Pressure may be applied to the IV site for a few minutes. A bandage (dressing)will be applied. The procedure may vary among health  care providers and hospitals. What happens after the procedure? Your temperature, blood pressure, pulse, breathing rate, and blood oxygen level will be monitored until you leave the hospital or clinic. Your blood may be tested to see how you have responded to the transfusion. You may be warmed with fluids or blankets to maintain a normal body temperature. If you receive your blood transfusion in an outpatient setting, you will be told whom to contact to report any reactions. Where to find more information Visit the American Red Cross: redcross.org Summary A blood transfusion is a procedure in which you receive blood or  a type of blood cell (blood component) through an IV. The blood given in a transfusion may be made up of different blood components. You may receive red blood cells, platelets, plasma, or white blood cells depending on the condition treated. Your temperature, blood pressure, and pulse will be monitored before, during, and after the transfusion. After the transfusion, your blood may be tested to see how your body has responded. This information is not intended to replace advice given to you by your health care provider. Make sure you discuss any questions you have with your health care provider. Document Revised: 01/01/2022 Document Reviewed: 01/01/2022 Elsevier Patient Education  [place].

## 2022-12-03 NOTE — Progress Notes (Signed)
Patient took his zofran at 0730 this morning.

## 2022-12-05 ENCOUNTER — Encounter: Payer: Self-pay | Admitting: Hematology

## 2022-12-09 ENCOUNTER — Inpatient Hospital Stay: Payer: Medicare Other

## 2022-12-09 ENCOUNTER — Other Ambulatory Visit: Payer: Self-pay

## 2022-12-09 DIAGNOSIS — D469 Myelodysplastic syndrome, unspecified: Secondary | ICD-10-CM

## 2022-12-09 DIAGNOSIS — Z5111 Encounter for antineoplastic chemotherapy: Secondary | ICD-10-CM | POA: Diagnosis not present

## 2022-12-09 DIAGNOSIS — D649 Anemia, unspecified: Secondary | ICD-10-CM

## 2022-12-09 LAB — CBC WITH DIFFERENTIAL (CANCER CENTER ONLY)
Abs Immature Granulocytes: 0.09 10*3/uL — ABNORMAL HIGH (ref 0.00–0.07)
Basophils Absolute: 0.1 10*3/uL (ref 0.0–0.1)
Basophils Relative: 2 %
Eosinophils Absolute: 0.3 10*3/uL (ref 0.0–0.5)
Eosinophils Relative: 10 %
HCT: 18.6 % — ABNORMAL LOW (ref 39.0–52.0)
Hemoglobin: 6.2 g/dL — CL (ref 13.0–17.0)
Immature Granulocytes: 3 %
Lymphocytes Relative: 11 %
Lymphs Abs: 0.3 10*3/uL — ABNORMAL LOW (ref 0.7–4.0)
MCH: 29.2 pg (ref 26.0–34.0)
MCHC: 33.3 g/dL (ref 30.0–36.0)
MCV: 87.7 fL (ref 80.0–100.0)
Monocytes Absolute: 0.2 10*3/uL (ref 0.1–1.0)
Monocytes Relative: 5 %
Neutro Abs: 2.2 10*3/uL (ref 1.7–7.7)
Neutrophils Relative %: 69 %
Platelet Count: 190 10*3/uL (ref 150–400)
RBC: 2.12 MIL/uL — ABNORMAL LOW (ref 4.22–5.81)
RDW: 14.7 % (ref 11.5–15.5)
WBC Count: 3.2 10*3/uL — ABNORMAL LOW (ref 4.0–10.5)
nRBC: 0 % (ref 0.0–0.2)

## 2022-12-09 LAB — SAMPLE TO BLOOD BANK

## 2022-12-09 LAB — PREPARE RBC (CROSSMATCH)

## 2022-12-09 MED ORDER — HEPARIN SOD (PORK) LOCK FLUSH 100 UNIT/ML IV SOLN: 500.0000 [IU] | Freq: Every day | INTRAVENOUS | Status: DC | PRN

## 2022-12-09 MED ORDER — SODIUM CHLORIDE 0.9% FLUSH: 10.0000 mL | INTRAVENOUS | Status: DC | PRN

## 2022-12-09 MED ORDER — SODIUM CHLORIDE 0.9% IV SOLUTION
250.0000 mL | Freq: Once | INTRAVENOUS | Status: AC
  Administered 2022-12-09: 250 mL via INTRAVENOUS

## 2022-12-09 MED ORDER — ACETAMINOPHEN 325 MG PO TABS
650.0000 mg | ORAL_TABLET | Freq: Once | ORAL | Status: AC
  Administered 2022-12-09: 650 mg via ORAL
  Filled 2022-12-09: qty 2

## 2022-12-09 MED ORDER — METHYLPREDNISOLONE SODIUM SUCC 40 MG IJ SOLR
40.0000 mg | Freq: Once | INTRAMUSCULAR | Status: AC
  Administered 2022-12-09: 40 mg via INTRAVENOUS
  Filled 2022-12-09: qty 1

## 2022-12-09 NOTE — Patient Instructions (Signed)
Blood Transfusion, Adult A blood transfusion is a procedure in which you receive blood or a type of blood cell (blood component) through an IV. You may need a blood transfusion when you have a low blood count, which is a low number of any blood cell. This may result from a bleeding disorder, illness, injury, or surgery. The blood may come from a donor, or you may be able to have your own blood collected and stored (autologous blood donation) before a planned surgery. The blood given in a transfusion may be made up of different blood components. You may receive: Red blood cells. These carry oxygen to the cells in the body. Platelets. These help your blood to clot. Plasma. This is the liquid part of your blood. It carries proteins and other substances throughout the body. White blood cells. These help you fight infections. If you have hemophilia or another clotting disorder, you may also receive other types of blood products. Depending on the type of blood product, this procedure may take 1-4 hours to complete. Tell a health care provider about: Any bleeding problems you have. Any previous reactions you have had during a blood transfusion. Any allergies you have. All medicines you are taking, including vitamins, herbs, eye drops, creams, and over-the-counter medicines. Any surgeries you have had. Any medical conditions you have. Whether you are pregnant or may be pregnant. What are the risks? Talk with your health care provider about risks. The most common problems include: A mild allergic reaction, such as red, swollen areas of skin (hives) and itching. Fever or chills. This may be the body's response to new blood cells received. This may occur during or up to 4 hours after the transfusion. More serious problems may include: A serious allergic reaction that causes difficulty breathing or swelling around the face and lips. Transfusion-associated circulatory overload (TACO), or too much fluid in  the lungs. This may cause breathing problems. Transfusion-related acute lung injury (TRALI), which causes breathing difficulty and low oxygen in the blood. This can occur within hours of the transfusion or several days later. Iron overload. This can happen after receiving many blood transfusions over a period of time. Infection or virus being transmitted. This is rare because donated blood is carefully tested before it is given. Hemolytic transfusion reaction. This is rare. It happens when the body's defense system (immune system)tries to attack the new blood cells. Symptoms may include fever, chills, nausea, low blood pressure, and low back or chest pain. Transfusion-associated graft-versus-host disease (TAGVHD). This is rare. It happens when donated cells attack the body's healthy tissues. What happens before the procedure? You will have a blood test to check your blood type. This test is done to know what kind of blood your body will accept and to match it to the donor blood. If you are going to have a planned surgery, you may be able to do an autologous blood donation. This may be done in case you need to have a transfusion. You will have your temperature, blood pressure, and pulse checked before the transfusion. If you have had an allergic reaction to a transfusion in the past, you may be given medicine to help prevent a reaction. This medicine may be given to you by mouth (orally) or through an IV. What happens during the procedure?  An IV will be inserted into one of your veins. The bag of blood will be attached to your IV. The blood will then enter through your vein. Your temperature, blood pressure,   and pulse will be monitored during the transfusion. This monitoring is done to detect early signs of a transfusion reaction. Tell your nurse right away if you have any of these symptoms during the transfusion: Shortness of breath or trouble breathing. Chest or back pain. Fever or  chills. Itching or hives. If you have any signs or symptoms of a reaction, your transfusion will be stopped and you may be given medicine. When the transfusion is complete, your IV will be removed. Pressure may be applied to the IV site for a few minutes. A bandage (dressing)will be applied. The procedure may vary among health care providers and hospitals. What happens after the procedure? Your temperature, blood pressure, pulse, breathing rate, and blood oxygen level will be monitored until you leave the hospital or clinic. Your blood may be tested to see how you have responded to the transfusion. You may be warmed with fluids or blankets to maintain a normal body temperature. If you receive your blood transfusion in an outpatient setting, you will be told whom to contact to report any reactions. Where to find more information Visit the American Red Cross: redcross.org Summary A blood transfusion is a procedure in which you receive blood or a type of blood cell (blood component) through an IV. The blood given in a transfusion may be made up of different blood components. You may receive red blood cells, platelets, plasma, or white blood cells depending on the condition treated. Your temperature, blood pressure, and pulse will be monitored before, during, and after the transfusion. After the transfusion, your blood may be tested to see how your body has responded. This information is not intended to replace advice given to you by your health care provider. Make sure you discuss any questions you have with your health care provider. Document Revised: 01/01/2022 Document Reviewed: 01/01/2022 Elsevier Patient Education  2023 [employer] Inc.  

## 2022-12-10 ENCOUNTER — Inpatient Hospital Stay: Payer: Medicare Other

## 2022-12-10 DIAGNOSIS — D469 Myelodysplastic syndrome, unspecified: Secondary | ICD-10-CM | POA: Diagnosis not present

## 2022-12-10 DIAGNOSIS — Z5111 Encounter for antineoplastic chemotherapy: Secondary | ICD-10-CM | POA: Diagnosis not present

## 2022-12-10 MED ORDER — ACETAMINOPHEN 325 MG PO TABS
650.0000 mg | ORAL_TABLET | Freq: Once | ORAL | Status: AC
  Administered 2022-12-10: 650 mg via ORAL
  Filled 2022-12-10: qty 2

## 2022-12-10 MED ORDER — SODIUM CHLORIDE 0.9% IV SOLUTION
250.0000 mL | Freq: Once | INTRAVENOUS | Status: AC
  Administered 2022-12-10: 250 mL via INTRAVENOUS

## 2022-12-10 MED ORDER — SODIUM CHLORIDE 0.9% FLUSH
10.0000 mL | INTRAVENOUS | Status: AC | PRN
  Administered 2022-12-10: 10 mL

## 2022-12-10 MED ORDER — METHYLPREDNISOLONE SODIUM SUCC 40 MG IJ SOLR
40.0000 mg | Freq: Once | INTRAMUSCULAR | Status: AC
  Administered 2022-12-10: 40 mg via INTRAVENOUS
  Filled 2022-12-10: qty 1

## 2022-12-10 MED ORDER — HEPARIN SOD (PORK) LOCK FLUSH 100 UNIT/ML IV SOLN
500.0000 [IU] | Freq: Every day | INTRAVENOUS | Status: AC | PRN
  Administered 2022-12-10: 500 [IU]

## 2022-12-10 NOTE — Patient Instructions (Signed)
Blood Transfusion, Adult A blood transfusion is a procedure in which you receive blood or a type of blood cell (blood component) through an IV. You may need a blood transfusion when you have a low blood count, which is a low number of any blood cell. This may result from a bleeding disorder, illness, injury, or surgery. The blood may come from a donor, or you may be able to have your own blood collected and stored (autologous blood donation) before a planned surgery. The blood given in a transfusion may be made up of different blood components. You may receive: Red blood cells. These carry oxygen to the cells in the body. Platelets. These help your blood to clot. Plasma. This is the liquid part of your blood. It carries proteins and other substances throughout the body. White blood cells. These help you fight infections. If you have hemophilia or another clotting disorder, you may also receive other types of blood products. Depending on the type of blood product, this procedure may take 1-4 hours to complete. Tell a health care provider about: Any bleeding problems you have. Any previous reactions you have had during a blood transfusion. Any allergies you have. All medicines you are taking, including vitamins, herbs, eye drops, creams, and over-the-counter medicines. Any surgeries you have had. Any medical conditions you have. Whether you are pregnant or may be pregnant. What are the risks? Talk with your health care provider about risks. The most common problems include: A mild allergic reaction, such as red, swollen areas of skin (hives) and itching. Fever or chills. This may be the body's response to new blood cells received. This may occur during or up to 4 hours after the transfusion. More serious problems may include: A serious allergic reaction that causes difficulty breathing or swelling around the face and lips. Transfusion-associated circulatory overload (TACO), or too much fluid in  the lungs. This may cause breathing problems. Transfusion-related acute lung injury (TRALI), which causes breathing difficulty and low oxygen in the blood. This can occur within hours of the transfusion or several days later. Iron overload. This can happen after receiving many blood transfusions over a period of time. Infection or virus being transmitted. This is rare because donated blood is carefully tested before it is given. Hemolytic transfusion reaction. This is rare. It happens when the body's defense system (immune system)tries to attack the new blood cells. Symptoms may include fever, chills, nausea, low blood pressure, and low back or chest pain. Transfusion-associated graft-versus-host disease (TAGVHD). This is rare. It happens when donated cells attack the body's healthy tissues. What happens before the procedure? You will have a blood test to check your blood type. This test is done to know what kind of blood your body will accept and to match it to the donor blood. If you are going to have a planned surgery, you may be able to do an autologous blood donation. This may be done in case you need to have a transfusion. You will have your temperature, blood pressure, and pulse checked before the transfusion. If you have had an allergic reaction to a transfusion in the past, you may be given medicine to help prevent a reaction. This medicine may be given to you by mouth (orally) or through an IV. What happens during the procedure?  An IV will be inserted into one of your veins. The bag of blood will be attached to your IV. The blood will then enter through your vein. Your temperature, blood pressure,   and pulse will be monitored during the transfusion. This monitoring is done to detect early signs of a transfusion reaction. Tell your nurse right away if you have any of these symptoms during the transfusion: Shortness of breath or trouble breathing. Chest or back pain. Fever or  chills. Itching or hives. If you have any signs or symptoms of a reaction, your transfusion will be stopped and you may be given medicine. When the transfusion is complete, your IV will be removed. Pressure may be applied to the IV site for a few minutes. A bandage (dressing)will be applied. The procedure may vary among health care providers and hospitals. What happens after the procedure? Your temperature, blood pressure, pulse, breathing rate, and blood oxygen level will be monitored until you leave the hospital or clinic. Your blood may be tested to see how you have responded to the transfusion. You may be warmed with fluids or blankets to maintain a normal body temperature. If you receive your blood transfusion in an outpatient setting, you will be told whom to contact to report any reactions. Where to find more information Visit the American Red Cross: redcross.org Summary A blood transfusion is a procedure in which you receive blood or a type of blood cell (blood component) through an IV. The blood given in a transfusion may be made up of different blood components. You may receive red blood cells, platelets, plasma, or white blood cells depending on the condition treated. Your temperature, blood pressure, and pulse will be monitored before, during, and after the transfusion. After the transfusion, your blood may be tested to see how your body has responded. This information is not intended to replace advice given to you by your health care provider. Make sure you discuss any questions you have with your health care provider. Document Revised: 01/01/2022 Document Reviewed: 01/01/2022 Elsevier Patient Education  2023 [employer] Inc.  

## 2022-12-11 ENCOUNTER — Other Ambulatory Visit: Payer: Self-pay

## 2022-12-11 LAB — BPAM RBC
Blood Product Expiration Date: 202403132359
Blood Product Expiration Date: 202403132359
ISSUE DATE / TIME: 202402221140
ISSUE DATE / TIME: 202402230757
Unit Type and Rh: 6200
Unit Type and Rh: 6200

## 2022-12-11 LAB — TYPE AND SCREEN
ABO/RH(D): A POS
Antibody Screen: NEGATIVE
Unit division: 0
Unit division: 0

## 2022-12-16 ENCOUNTER — Inpatient Hospital Stay: Payer: Medicare Other

## 2022-12-16 ENCOUNTER — Other Ambulatory Visit: Payer: Self-pay

## 2022-12-16 DIAGNOSIS — D469 Myelodysplastic syndrome, unspecified: Secondary | ICD-10-CM

## 2022-12-16 DIAGNOSIS — D649 Anemia, unspecified: Secondary | ICD-10-CM

## 2022-12-16 DIAGNOSIS — Z5111 Encounter for antineoplastic chemotherapy: Secondary | ICD-10-CM | POA: Diagnosis not present

## 2022-12-16 LAB — CBC WITH DIFFERENTIAL (CANCER CENTER ONLY)
Abs Immature Granulocytes: 0.14 10*3/uL — ABNORMAL HIGH (ref 0.00–0.07)
Basophils Absolute: 0.1 10*3/uL (ref 0.0–0.1)
Basophils Relative: 2 %
Eosinophils Absolute: 0.3 10*3/uL (ref 0.0–0.5)
Eosinophils Relative: 7 %
HCT: 19.7 % — ABNORMAL LOW (ref 39.0–52.0)
Hemoglobin: 6.6 g/dL — CL (ref 13.0–17.0)
Immature Granulocytes: 3 %
Lymphocytes Relative: 9 %
Lymphs Abs: 0.4 10*3/uL — ABNORMAL LOW (ref 0.7–4.0)
MCH: 29.6 pg (ref 26.0–34.0)
MCHC: 33.5 g/dL (ref 30.0–36.0)
MCV: 88.3 fL (ref 80.0–100.0)
Monocytes Absolute: 0.4 10*3/uL (ref 0.1–1.0)
Monocytes Relative: 10 %
Neutro Abs: 2.9 10*3/uL (ref 1.7–7.7)
Neutrophils Relative %: 69 %
Platelet Count: 440 10*3/uL — ABNORMAL HIGH (ref 150–400)
RBC: 2.23 MIL/uL — ABNORMAL LOW (ref 4.22–5.81)
RDW: 14.7 % (ref 11.5–15.5)
WBC Count: 4.2 10*3/uL (ref 4.0–10.5)
nRBC: 0 % (ref 0.0–0.2)

## 2022-12-16 LAB — PREPARE RBC (CROSSMATCH)

## 2022-12-16 MED ORDER — HEPARIN SOD (PORK) LOCK FLUSH 100 UNIT/ML IV SOLN
250.0000 [IU] | INTRAVENOUS | Status: AC | PRN
  Administered 2022-12-16: 250 [IU]

## 2022-12-16 MED ORDER — SODIUM CHLORIDE 0.9% FLUSH
10.0000 mL | Freq: Once | INTRAVENOUS | Status: AC
  Administered 2022-12-16: 10 mL

## 2022-12-16 MED ORDER — SODIUM CHLORIDE 0.9% FLUSH
3.0000 mL | INTRAVENOUS | Status: AC | PRN
  Administered 2022-12-16: 3 mL

## 2022-12-16 MED ORDER — ACETAMINOPHEN 325 MG PO TABS
650.0000 mg | ORAL_TABLET | Freq: Once | ORAL | Status: AC
  Administered 2022-12-16: 650 mg via ORAL
  Filled 2022-12-16: qty 2

## 2022-12-16 MED ORDER — METHYLPREDNISOLONE SODIUM SUCC 40 MG IJ SOLR
40.0000 mg | Freq: Once | INTRAMUSCULAR | Status: AC
  Administered 2022-12-16: 40 mg via INTRAVENOUS
  Filled 2022-12-16: qty 1

## 2022-12-16 MED ORDER — SODIUM CHLORIDE 0.9% IV SOLUTION
250.0000 mL | Freq: Once | INTRAVENOUS | Status: AC
  Administered 2022-12-16: 250 mL via INTRAVENOUS

## 2022-12-16 NOTE — Patient Instructions (Signed)
Blood Transfusion, Adult, Care After The following information offers guidance on how to care for yourself after your procedure. Your health care provider may also give you more specific instructions. If you have problems or questions, contact your health care provider. What can I expect after the procedure? After the procedure, it is common to have: Bruising and soreness where the IV was inserted. A headache. Follow these instructions at home: IV insertion site care     Follow instructions from your health care provider about how to take care of your IV insertion site. Make sure you: Wash your hands with soap and water for at least 20 seconds before and after you change your bandage (dressing). If soap and water are not available, use hand sanitizer. Change your dressing as told by your health care provider. Check your IV insertion site every day for signs of infection. Check for: Redness, swelling, or pain. Bleeding from the site. Warmth. Pus or a bad smell. General instructions Take over-the-counter and prescription medicines only as told by your health care provider. Rest as told by your health care provider. Return to your normal activities as told by your health care provider. Keep all follow-up visits. Lab tests may need to be done at certain periods to recheck your blood counts. Contact a health care provider if: You have itching or red, swollen areas of skin (hives). You have a fever or chills. You have pain in the head, back, or chest. You feel anxious or you feel weak after doing your normal activities. You have redness, swelling, warmth, or pain around the IV insertion site. You have blood coming from the IV insertion site that does not stop with pressure. You have pus or a bad smell coming from your IV insertion site. If you received your blood transfusion in an outpatient setting, you will be told whom to contact to report any reactions. Get help right away if: You  have symptoms of a serious allergic or immune system reaction, including: Trouble breathing or shortness of breath. Swelling of the face, feeling flushed, or widespread rash. Dark urine or blood in the urine. Fast heartbeat. These symptoms may be an emergency. Get help right away. Call 911. Do not wait to see if the symptoms will go away. Do not drive yourself to the hospital. Summary Bruising and soreness around the IV insertion site are common. Check your IV insertion site every day for signs of infection. Rest as told by your health care provider. Return to your normal activities as told by your health care provider. Get help right away for symptoms of a serious allergic or immune system reaction to the blood transfusion. This information is not intended to replace advice given to you by your health care provider. Make sure you discuss any questions you have with your health care provider. Document Revised: 01/01/2022 Document Reviewed: 01/01/2022 Elsevier Patient Education  2023 Elsevier Inc.  

## 2022-12-17 ENCOUNTER — Inpatient Hospital Stay: Payer: Medicare Other | Attending: Hematology

## 2022-12-17 DIAGNOSIS — J90 Pleural effusion, not elsewhere classified: Secondary | ICD-10-CM | POA: Insufficient documentation

## 2022-12-17 DIAGNOSIS — M25471 Effusion, right ankle: Secondary | ICD-10-CM | POA: Insufficient documentation

## 2022-12-17 DIAGNOSIS — R161 Splenomegaly, not elsewhere classified: Secondary | ICD-10-CM | POA: Insufficient documentation

## 2022-12-17 DIAGNOSIS — Z79899 Other long term (current) drug therapy: Secondary | ICD-10-CM | POA: Diagnosis not present

## 2022-12-17 DIAGNOSIS — M1711 Unilateral primary osteoarthritis, right knee: Secondary | ICD-10-CM | POA: Insufficient documentation

## 2022-12-17 DIAGNOSIS — K449 Diaphragmatic hernia without obstruction or gangrene: Secondary | ICD-10-CM | POA: Diagnosis not present

## 2022-12-17 DIAGNOSIS — D469 Myelodysplastic syndrome, unspecified: Secondary | ICD-10-CM | POA: Diagnosis not present

## 2022-12-17 DIAGNOSIS — K219 Gastro-esophageal reflux disease without esophagitis: Secondary | ICD-10-CM | POA: Insufficient documentation

## 2022-12-17 DIAGNOSIS — Z87891 Personal history of nicotine dependence: Secondary | ICD-10-CM | POA: Insufficient documentation

## 2022-12-17 DIAGNOSIS — K5900 Constipation, unspecified: Secondary | ICD-10-CM | POA: Diagnosis not present

## 2022-12-17 DIAGNOSIS — D75839 Thrombocytosis, unspecified: Secondary | ICD-10-CM | POA: Diagnosis not present

## 2022-12-17 DIAGNOSIS — Z7982 Long term (current) use of aspirin: Secondary | ICD-10-CM | POA: Diagnosis not present

## 2022-12-17 DIAGNOSIS — M25472 Effusion, left ankle: Secondary | ICD-10-CM | POA: Insufficient documentation

## 2022-12-17 LAB — PREPARE RBC (CROSSMATCH)

## 2022-12-17 MED ORDER — METHYLPREDNISOLONE SODIUM SUCC 40 MG IJ SOLR
40.0000 mg | Freq: Once | INTRAMUSCULAR | Status: AC
  Administered 2022-12-17: 40 mg via INTRAVENOUS
  Filled 2022-12-17: qty 1

## 2022-12-17 MED ORDER — HEPARIN SOD (PORK) LOCK FLUSH 100 UNIT/ML IV SOLN
500.0000 [IU] | Freq: Once | INTRAVENOUS | Status: AC
  Administered 2022-12-17: 500 [IU] via INTRAVENOUS

## 2022-12-17 MED ORDER — SODIUM CHLORIDE 0.9% IV SOLUTION
250.0000 mL | Freq: Once | INTRAVENOUS | Status: AC
  Administered 2022-12-17: 250 mL via INTRAVENOUS

## 2022-12-17 MED ORDER — SODIUM CHLORIDE 0.9% FLUSH
10.0000 mL | Freq: Once | INTRAVENOUS | Status: AC
  Administered 2022-12-17: 10 mL via INTRAVENOUS

## 2022-12-17 MED ORDER — ACETAMINOPHEN 325 MG PO TABS
650.0000 mg | ORAL_TABLET | Freq: Once | ORAL | Status: AC
  Administered 2022-12-17: 650 mg via ORAL
  Filled 2022-12-17: qty 2

## 2022-12-17 NOTE — Patient Instructions (Signed)
Blood Transfusion, Adult, Care After The following information offers guidance on how to care for yourself after your procedure. Your health care provider may also give you more specific instructions. If you have problems or questions, contact your health care provider. What can I expect after the procedure? After the procedure, it is common to have: Bruising and soreness where the IV was inserted. A headache. Follow these instructions at home: IV insertion site care     Follow instructions from your health care provider about how to take care of your IV insertion site. Make sure you: Wash your hands with soap and water for at least 20 seconds before and after you change your bandage (dressing). If soap and water are not available, use hand sanitizer. Change your dressing as told by your health care provider. Check your IV insertion site every day for signs of infection. Check for: Redness, swelling, or pain. Bleeding from the site. Warmth. Pus or a bad smell. General instructions Take over-the-counter and prescription medicines only as told by your health care provider. Rest as told by your health care provider. Return to your normal activities as told by your health care provider. Keep all follow-up visits. Lab tests may need to be done at certain periods to recheck your blood counts. Contact a health care provider if: You have itching or red, swollen areas of skin (hives). You have a fever or chills. You have pain in the head, back, or chest. You feel anxious or you feel weak after doing your normal activities. You have redness, swelling, warmth, or pain around the IV insertion site. You have blood coming from the IV insertion site that does not stop with pressure. You have pus or a bad smell coming from your IV insertion site. If you received your blood transfusion in an outpatient setting, you will be told whom to contact to report any reactions. Get help right away if: You  have symptoms of a serious allergic or immune system reaction, including: Trouble breathing or shortness of breath. Swelling of the face, feeling flushed, or widespread rash. Dark urine or blood in the urine. Fast heartbeat. These symptoms may be an emergency. Get help right away. Call 911. Do not wait to see if the symptoms will go away. Do not drive yourself to the hospital. Summary Bruising and soreness around the IV insertion site are common. Check your IV insertion site every day for signs of infection. Rest as told by your health care provider. Return to your normal activities as told by your health care provider. Get help right away for symptoms of a serious allergic or immune system reaction to the blood transfusion. This information is not intended to replace advice given to you by your health care provider. Make sure you discuss any questions you have with your health care provider. Document Revised: 01/01/2022 Document Reviewed: 01/01/2022 Elsevier Patient Education  2023 Elsevier Inc.  

## 2022-12-18 LAB — TYPE AND SCREEN
ABO/RH(D): A POS
Antibody Screen: NEGATIVE
Unit division: 0
Unit division: 0

## 2022-12-18 LAB — BPAM RBC
Blood Product Expiration Date: 202403222359
Blood Product Expiration Date: 202403222359
ISSUE DATE / TIME: 202402291107
ISSUE DATE / TIME: 202403011128
Unit Type and Rh: 6200
Unit Type and Rh: 6200

## 2022-12-23 ENCOUNTER — Inpatient Hospital Stay: Payer: Medicare Other

## 2022-12-23 ENCOUNTER — Other Ambulatory Visit: Payer: Self-pay

## 2022-12-23 DIAGNOSIS — D469 Myelodysplastic syndrome, unspecified: Secondary | ICD-10-CM

## 2022-12-23 DIAGNOSIS — M25472 Effusion, left ankle: Secondary | ICD-10-CM | POA: Diagnosis not present

## 2022-12-23 DIAGNOSIS — M25471 Effusion, right ankle: Secondary | ICD-10-CM | POA: Diagnosis not present

## 2022-12-23 DIAGNOSIS — D75839 Thrombocytosis, unspecified: Secondary | ICD-10-CM | POA: Diagnosis not present

## 2022-12-23 DIAGNOSIS — K5900 Constipation, unspecified: Secondary | ICD-10-CM | POA: Diagnosis not present

## 2022-12-23 DIAGNOSIS — Z87891 Personal history of nicotine dependence: Secondary | ICD-10-CM | POA: Diagnosis not present

## 2022-12-23 DIAGNOSIS — J90 Pleural effusion, not elsewhere classified: Secondary | ICD-10-CM | POA: Diagnosis not present

## 2022-12-23 DIAGNOSIS — R161 Splenomegaly, not elsewhere classified: Secondary | ICD-10-CM | POA: Diagnosis not present

## 2022-12-23 DIAGNOSIS — M1711 Unilateral primary osteoarthritis, right knee: Secondary | ICD-10-CM | POA: Diagnosis not present

## 2022-12-23 DIAGNOSIS — K449 Diaphragmatic hernia without obstruction or gangrene: Secondary | ICD-10-CM | POA: Diagnosis not present

## 2022-12-23 DIAGNOSIS — Z79899 Other long term (current) drug therapy: Secondary | ICD-10-CM | POA: Diagnosis not present

## 2022-12-23 DIAGNOSIS — K219 Gastro-esophageal reflux disease without esophagitis: Secondary | ICD-10-CM | POA: Diagnosis not present

## 2022-12-23 DIAGNOSIS — Z7982 Long term (current) use of aspirin: Secondary | ICD-10-CM | POA: Diagnosis not present

## 2022-12-23 DIAGNOSIS — D649 Anemia, unspecified: Secondary | ICD-10-CM

## 2022-12-23 LAB — CBC WITH DIFFERENTIAL (CANCER CENTER ONLY)
Abs Immature Granulocytes: 0.32 10*3/uL — ABNORMAL HIGH (ref 0.00–0.07)
Basophils Absolute: 0.3 10*3/uL — ABNORMAL HIGH (ref 0.0–0.1)
Basophils Relative: 7 %
Eosinophils Absolute: 0.4 10*3/uL (ref 0.0–0.5)
Eosinophils Relative: 9 %
HCT: 22.9 % — ABNORMAL LOW (ref 39.0–52.0)
Hemoglobin: 7.6 g/dL — ABNORMAL LOW (ref 13.0–17.0)
Immature Granulocytes: 7 %
Lymphocytes Relative: 11 %
Lymphs Abs: 0.5 10*3/uL — ABNORMAL LOW (ref 0.7–4.0)
MCH: 29.6 pg (ref 26.0–34.0)
MCHC: 33.2 g/dL (ref 30.0–36.0)
MCV: 89.1 fL (ref 80.0–100.0)
Monocytes Absolute: 0.3 10*3/uL (ref 0.1–1.0)
Monocytes Relative: 7 %
Neutro Abs: 2.8 10*3/uL (ref 1.7–7.7)
Neutrophils Relative %: 59 %
Platelet Count: 682 10*3/uL — ABNORMAL HIGH (ref 150–400)
RBC: 2.57 MIL/uL — ABNORMAL LOW (ref 4.22–5.81)
RDW: 14.6 % (ref 11.5–15.5)
WBC Count: 4.8 10*3/uL (ref 4.0–10.5)
nRBC: 0 % (ref 0.0–0.2)

## 2022-12-23 LAB — PREPARE RBC (CROSSMATCH)

## 2022-12-23 MED ORDER — METHYLPREDNISOLONE SODIUM SUCC 40 MG IJ SOLR
40.0000 mg | Freq: Once | INTRAMUSCULAR | Status: AC
  Administered 2022-12-23: 40 mg via INTRAVENOUS
  Filled 2022-12-23: qty 1

## 2022-12-23 MED ORDER — ACETAMINOPHEN 325 MG PO TABS
650.0000 mg | ORAL_TABLET | Freq: Once | ORAL | Status: AC
  Administered 2022-12-23: 650 mg via ORAL
  Filled 2022-12-23: qty 2

## 2022-12-23 MED ORDER — SODIUM CHLORIDE 0.9% FLUSH
10.0000 mL | INTRAVENOUS | Status: AC | PRN
  Administered 2022-12-23: 10 mL

## 2022-12-23 MED ORDER — HEPARIN SOD (PORK) LOCK FLUSH 100 UNIT/ML IV SOLN
500.0000 [IU] | Freq: Every day | INTRAVENOUS | Status: AC | PRN
  Administered 2022-12-23: 500 [IU]

## 2022-12-23 MED ORDER — SODIUM CHLORIDE 0.9% IV SOLUTION
250.0000 mL | Freq: Once | INTRAVENOUS | Status: AC
  Administered 2022-12-23: 250 mL via INTRAVENOUS

## 2022-12-23 MED ORDER — SODIUM CHLORIDE 0.9% FLUSH
10.0000 mL | Freq: Once | INTRAVENOUS | Status: AC
  Administered 2022-12-23: 10 mL

## 2022-12-23 NOTE — Patient Instructions (Signed)
Blood Transfusion, Adult A blood transfusion is a procedure in which you receive blood or a type of blood cell (blood component) through an IV. You may need a blood transfusion when you have a low blood count, which is a low number of any blood cell. This may result from a bleeding disorder, illness, injury, or surgery. The blood may come from a donor, or you may be able to have your own blood collected and stored (autologous blood donation) before a planned surgery. The blood given in a transfusion may be made up of different blood components. You may receive: Red blood cells. These carry oxygen to the cells in the body. Platelets. These help your blood to clot. Plasma. This is the liquid part of your blood. It carries proteins and other substances throughout the body. White blood cells. These help you fight infections. If you have hemophilia or another clotting disorder, you may also receive other types of blood products. Depending on the type of blood product, this procedure may take 1-4 hours to complete. Tell a health care provider about: Any bleeding problems you have. Any previous reactions you have had during a blood transfusion. Any allergies you have. All medicines you are taking, including vitamins, herbs, eye drops, creams, and over-the-counter medicines. Any surgeries you have had. Any medical conditions you have. Whether you are pregnant or may be pregnant. What are the risks? Talk with your health care provider about risks. The most common problems include: A mild allergic reaction, such as red, swollen areas of skin (hives) and itching. Fever or chills. This may be the body's response to new blood cells received. This may occur during or up to 4 hours after the transfusion. More serious problems may include: A serious allergic reaction that causes difficulty breathing or swelling around the face and lips. Transfusion-associated circulatory overload (TACO), or too much fluid in  the lungs. This may cause breathing problems. Transfusion-related acute lung injury (TRALI), which causes breathing difficulty and low oxygen in the blood. This can occur within hours of the transfusion or several days later. Iron overload. This can happen after receiving many blood transfusions over a period of time. Infection or virus being transmitted. This is rare because donated blood is carefully tested before it is given. Hemolytic transfusion reaction. This is rare. It happens when the body's defense system (immune system)tries to attack the new blood cells. Symptoms may include fever, chills, nausea, low blood pressure, and low back or chest pain. Transfusion-associated graft-versus-host disease (TAGVHD). This is rare. It happens when donated cells attack the body's healthy tissues. What happens before the procedure? You will have a blood test to check your blood type. This test is done to know what kind of blood your body will accept and to match it to the donor blood. If you are going to have a planned surgery, you may be able to do an autologous blood donation. This may be done in case you need to have a transfusion. You will have your temperature, blood pressure, and pulse checked before the transfusion. If you have had an allergic reaction to a transfusion in the past, you may be given medicine to help prevent a reaction. This medicine may be given to you by mouth (orally) or through an IV. What happens during the procedure?  An IV will be inserted into one of your veins. The bag of blood will be attached to your IV. The blood will then enter through your vein. Your temperature, blood pressure,   and pulse will be monitored during the transfusion. This monitoring is done to detect early signs of a transfusion reaction. Tell your nurse right away if you have any of these symptoms during the transfusion: Shortness of breath or trouble breathing. Chest or back pain. Fever or  chills. Itching or hives. If you have any signs or symptoms of a reaction, your transfusion will be stopped and you may be given medicine. When the transfusion is complete, your IV will be removed. Pressure may be applied to the IV site for a few minutes. A bandage (dressing)will be applied. The procedure may vary among health care providers and hospitals. What happens after the procedure? Your temperature, blood pressure, pulse, breathing rate, and blood oxygen level will be monitored until you leave the hospital or clinic. Your blood may be tested to see how you have responded to the transfusion. You may be warmed with fluids or blankets to maintain a normal body temperature. If you receive your blood transfusion in an outpatient setting, you will be told whom to contact to report any reactions. Where to find more information Visit the American Red Cross: redcross.org Summary A blood transfusion is a procedure in which you receive blood or a type of blood cell (blood component) through an IV. The blood given in a transfusion may be made up of different blood components. You may receive red blood cells, platelets, plasma, or white blood cells depending on the condition treated. Your temperature, blood pressure, and pulse will be monitored before, during, and after the transfusion. After the transfusion, your blood may be tested to see how your body has responded. This information is not intended to replace advice given to you by your health care provider. Make sure you discuss any questions you have with your health care provider. Document Revised: 01/01/2022 Document Reviewed: 01/01/2022 Elsevier Patient Education  2023 [employer] Inc.  

## 2022-12-24 LAB — BPAM RBC
Blood Product Expiration Date: 202404022359
ISSUE DATE / TIME: 202403071056
Unit Type and Rh: 6200

## 2022-12-24 LAB — TYPE AND SCREEN
ABO/RH(D): A POS
Antibody Screen: NEGATIVE
Unit division: 0

## 2022-12-27 ENCOUNTER — Other Ambulatory Visit: Payer: Self-pay

## 2022-12-27 ENCOUNTER — Inpatient Hospital Stay: Payer: Medicare Other

## 2022-12-27 ENCOUNTER — Inpatient Hospital Stay (HOSPITAL_BASED_OUTPATIENT_CLINIC_OR_DEPARTMENT_OTHER): Payer: Medicare Other | Admitting: Hematology

## 2022-12-27 VITALS — BP 104/60 | HR 70 | Temp 97.6°F | Resp 17 | Ht 67.0 in | Wt 165.5 lb

## 2022-12-27 DIAGNOSIS — Z5111 Encounter for antineoplastic chemotherapy: Secondary | ICD-10-CM

## 2022-12-27 DIAGNOSIS — M1711 Unilateral primary osteoarthritis, right knee: Secondary | ICD-10-CM | POA: Diagnosis not present

## 2022-12-27 DIAGNOSIS — D75839 Thrombocytosis, unspecified: Secondary | ICD-10-CM | POA: Diagnosis not present

## 2022-12-27 DIAGNOSIS — M25471 Effusion, right ankle: Secondary | ICD-10-CM | POA: Diagnosis not present

## 2022-12-27 DIAGNOSIS — D649 Anemia, unspecified: Secondary | ICD-10-CM

## 2022-12-27 DIAGNOSIS — Z7982 Long term (current) use of aspirin: Secondary | ICD-10-CM | POA: Diagnosis not present

## 2022-12-27 DIAGNOSIS — Z79899 Other long term (current) drug therapy: Secondary | ICD-10-CM | POA: Diagnosis not present

## 2022-12-27 DIAGNOSIS — D469 Myelodysplastic syndrome, unspecified: Secondary | ICD-10-CM | POA: Diagnosis not present

## 2022-12-27 DIAGNOSIS — R161 Splenomegaly, not elsewhere classified: Secondary | ICD-10-CM | POA: Diagnosis not present

## 2022-12-27 DIAGNOSIS — K5900 Constipation, unspecified: Secondary | ICD-10-CM | POA: Diagnosis not present

## 2022-12-27 DIAGNOSIS — K449 Diaphragmatic hernia without obstruction or gangrene: Secondary | ICD-10-CM | POA: Diagnosis not present

## 2022-12-27 DIAGNOSIS — M25472 Effusion, left ankle: Secondary | ICD-10-CM | POA: Diagnosis not present

## 2022-12-27 DIAGNOSIS — Z87891 Personal history of nicotine dependence: Secondary | ICD-10-CM | POA: Diagnosis not present

## 2022-12-27 DIAGNOSIS — J90 Pleural effusion, not elsewhere classified: Secondary | ICD-10-CM | POA: Diagnosis not present

## 2022-12-27 DIAGNOSIS — K219 Gastro-esophageal reflux disease without esophagitis: Secondary | ICD-10-CM | POA: Diagnosis not present

## 2022-12-27 LAB — CMP (CANCER CENTER ONLY)
ALT: 20 U/L (ref 0–44)
AST: 12 U/L — ABNORMAL LOW (ref 15–41)
Albumin: 3.8 g/dL (ref 3.5–5.0)
Alkaline Phosphatase: 76 U/L (ref 38–126)
Anion gap: 3 — ABNORMAL LOW (ref 5–15)
BUN: 22 mg/dL (ref 8–23)
CO2: 28 mmol/L (ref 22–32)
Calcium: 8.5 mg/dL — ABNORMAL LOW (ref 8.9–10.3)
Chloride: 106 mmol/L (ref 98–111)
Creatinine: 0.87 mg/dL (ref 0.61–1.24)
GFR, Estimated: >60 mL/min (ref >60)
Glucose, Bld: 114 mg/dL — ABNORMAL HIGH (ref 70–99)
Potassium: 4.4 mmol/L (ref 3.5–5.1)
Sodium: 137 mmol/L (ref 135–145)
Total Bilirubin: 0.6 mg/dL (ref 0.3–1.2)
Total Protein: 5.6 g/dL — ABNORMAL LOW (ref 6.5–8.1)

## 2022-12-27 LAB — CBC WITH DIFFERENTIAL (CANCER CENTER ONLY)
Abs Immature Granulocytes: 0.38 10*3/uL — ABNORMAL HIGH (ref 0.00–0.07)
Basophils Absolute: 0.4 10*3/uL — ABNORMAL HIGH (ref 0.0–0.1)
Basophils Relative: 7 %
Eosinophils Absolute: 0.5 10*3/uL (ref 0.0–0.5)
Eosinophils Relative: 10 %
HCT: 22.9 % — ABNORMAL LOW (ref 39.0–52.0)
Hemoglobin: 7.6 g/dL — ABNORMAL LOW (ref 13.0–17.0)
Immature Granulocytes: 8 %
Lymphocytes Relative: 10 %
Lymphs Abs: 0.5 10*3/uL — ABNORMAL LOW (ref 0.7–4.0)
MCH: 29.3 pg (ref 26.0–34.0)
MCHC: 33.2 g/dL (ref 30.0–36.0)
MCV: 88.4 fL (ref 80.0–100.0)
Monocytes Absolute: 0.4 10*3/uL (ref 0.1–1.0)
Monocytes Relative: 8 %
Neutro Abs: 2.9 10*3/uL (ref 1.7–7.7)
Neutrophils Relative %: 57 %
Platelet Count: 568 10*3/uL — ABNORMAL HIGH (ref 150–400)
RBC: 2.59 MIL/uL — ABNORMAL LOW (ref 4.22–5.81)
RDW: 14.6 % (ref 11.5–15.5)
WBC Count: 5 10*3/uL (ref 4.0–10.5)
nRBC: 0 % (ref 0.0–0.2)

## 2022-12-27 MED ORDER — SODIUM CHLORIDE 0.9 % IV SOLN: Freq: Once | INTRAVENOUS | Status: AC

## 2022-12-27 MED ORDER — SODIUM CHLORIDE 0.9% FLUSH: 10.0000 mL | Freq: Once | INTRAVENOUS | Status: DC

## 2022-12-27 MED ORDER — SODIUM CHLORIDE 0.9 % IV SOLN
75.0000 mg/m2 | Freq: Once | INTRAVENOUS | Status: AC
  Administered 2022-12-27: 141 mg via INTRAVENOUS
  Filled 2022-12-27: qty 14.1

## 2022-12-27 MED ORDER — SODIUM CHLORIDE 0.9% FLUSH
10.0000 mL | INTRAVENOUS | Status: DC | PRN
  Administered 2022-12-27: 10 mL

## 2022-12-27 MED ORDER — HEPARIN SOD (PORK) LOCK FLUSH 100 UNIT/ML IV SOLN
500.0000 [IU] | Freq: Once | INTRAVENOUS | Status: AC | PRN
  Administered 2022-12-27: 500 [IU]

## 2022-12-27 MED ORDER — ONDANSETRON HCL 8 MG PO TABS: 8.0000 mg | ORAL_TABLET | Freq: Once | ORAL | Status: DC

## 2022-12-27 NOTE — Progress Notes (Signed)
Pt informed this RN that he took Zofran PO at home prior to his appointments today at 0800.

## 2022-12-27 NOTE — Patient Instructions (Signed)
[facility]  Discharge Instructions: Thank you for choosing [facility] to provide your oncology and hematology care.   If you have a lab appointment with the Cancer Center, please go directly to the Cancer Center and check in at the registration area.   Wear comfortable clothing and clothing appropriate for easy access to any Portacath or PICC line.   We strive to give you quality time with your provider. You may need to reschedule your appointment if you arrive late (15 or more minutes).  Arriving late affects you and other patients whose appointments are after yours.  Also, if you miss three or more appointments without notifying the office, you may be dismissed from the clinic at the provider's discretion.      For prescription refill requests, have your pharmacy contact our office and allow 72 hours for refills to be completed.    Today you received the following chemotherapy and/or immunotherapy agents: Vidaza     To help prevent nausea and vomiting after your treatment, we encourage you to take your nausea medication as directed.  BELOW ARE SYMPTOMS THAT SHOULD BE REPORTED IMMEDIATELY: *FEVER GREATER THAN 100.4 F (38 C) OR HIGHER *CHILLS OR SWEATING *NAUSEA AND VOMITING THAT IS NOT CONTROLLED WITH YOUR NAUSEA MEDICATION *UNUSUAL SHORTNESS OF BREATH *UNUSUAL BRUISING OR BLEEDING *URINARY PROBLEMS (pain or burning when urinating, or frequent urination) *BOWEL PROBLEMS (unusual diarrhea, constipation, pain near the anus) TENDERNESS IN MOUTH AND THROAT WITH OR WITHOUT PRESENCE OF ULCERS (sore throat, sores in mouth, or a toothache) UNUSUAL RASH, SWELLING OR PAIN  UNUSUAL VAGINAL DISCHARGE OR ITCHING   Items with * indicate a potential emergency and should be followed up as soon as possible or go to the Emergency Department if any problems should occur.  Please show the CHEMOTHERAPY ALERT CARD or IMMUNOTHERAPY ALERT CARD at check-in  to the Emergency Department and triage nurse.  Should you have questions after your visit or need to cancel or reschedule your appointment, please contact [facility]  Dept: [phone]  and follow the prompts.  Office hours are 8:00 a.m. to 4:30 p.m. Monday - Friday. Please note that voicemails left after 4:00 p.m. may not be returned until the following business day.  We are closed weekends and major holidays. You have access to a nurse at all times for urgent questions. Please call the main number to the clinic Dept: [phone] and follow the prompts.   For any non-urgent questions, you may also contact your provider using MyChart. We now offer e-Visits for anyone 18 and older to request care online for non-urgent symptoms. For details visit [email or web address].   Also download the MyChart app! Go to the app store, search "MyChart", open the app, select [place], and log in with your MyChart username and password.  

## 2022-12-27 NOTE — Progress Notes (Signed)
Per Dr. Irene Limbo encounter note OK to proceed with tx with hgb 7.6 g/dL. Pt to receive 1 unit of PRBCs on Thursday 12/30/2022.

## 2022-12-27 NOTE — Progress Notes (Signed)
HEMATOLOGY/ONCOLOGY PROGRESS NOTE:   Date of Service: 12/27/22  Patient Care Team: Lurline Del, DO as PCP - General (Family Medicine)  CHIEF COMPLAINTS:  Follow-up for continued evaluation and management of JAK2 positive myeloproliferative neoplasm/MDS  INTERVAL HISTORY:  [name] is a 77 y.o. male here for continued evaluation and management of his MPN/MDS with refractory anemia and thrombocytosis. He is here to start cycle 5 of Vidaza.  Patient was last seen by me on 11/29/2022 and he complained of abdominal pain which worsens when he changes positions while sleeping and mild bilateral ankle swelling.   Patient is accompanied by her wife during this visit. Patient reports he has been doing well overall without any new medical concerns. He denies fever, chills, night sweats, headaches, abnormal bleeding, abdominal pain, chest pain, back pain, or abnormal bowel movements. He does complain of headache and body weakness when his blood counts drop, but he gets better after he receives blood transfusion. He also complains of mild bilateral leg swelling, which improves with compression socks and exercise.   He complains of constipation and he has been taking Senna-S which helps with his constipation.   Patient reports he has spleen pain when he receives 2 blood transfusions. He notes that spacing out blood transfusion helps relieve his spleen pain.   Patient tolerated his previous cycle of Vidaza well without any new or severe toxicities. He denies skin rashes or other toxicities.   MEDICAL HISTORY:  Past Medical History:  Diagnosis Date   Arthritis of right knee    Cervicalgia    Chest discomfort    normal stress echo   Chest pain    Colon polyps    Dyslipidemia    Fluttering sensation of heart    GERD without esophagitis    Hematochezia    Hyperglycemia    Hyperlipidemia    Internal hemorrhoids    Kidney stones    Male erectile dysfunction, unspecified    Mixed  dyslipidemia    Mixed hyperlipidemia    Ocular migraine    PAC (premature atrial contraction)    Personal history of colonic polyps    Prediabetes    Residual hemorrhoidal skin tags    Spleen enlarged    nov 2023 hospitalized   Unspecified hemorrhoids     SURGICAL HISTORY: Past Surgical History:  Procedure Laterality Date   BONE MARROW BIOPSY     IR IMAGING GUIDED PORT INSERTION  09/14/2022   KIDNEY STONE SURGERY     retrieval    SOCIAL HISTORY: Social History   Socioeconomic History   Marital status: Married    Spouse name: Not on file   Number of children: Not on file   Years of education: Not on file   Highest education level: Not on file  Occupational History   Not on file  Tobacco Use   Smoking status: Former    Types: Cigarettes   Smokeless tobacco: Never  Substance and Sexual Activity   Alcohol use: Yes    Alcohol/week: 1.0 standard drink of alcohol    Types: 1 Cans of beer per week   Drug use: Not on file   Sexual activity: Not on file  Other Topics Concern   Not on file  Social History Narrative   Not on file   Social Determinants of Health   Financial Resource Strain: Not on file  Food Insecurity: Unknown (08/31/2022)   Hunger Vital Sign    Worried About Charity fundraiser in  the Last Year: Never true    [place] in the Last Year: Not on file  Transportation Needs: No Transportation Needs (08/31/2022)   PRAPARE - Hydrologist (Medical): No    Lack of Transportation (Non-Medical): No  Physical Activity: Not on file  Stress: Not on file  Social Connections: Not on file  Intimate Partner Violence: Not on file    FAMILY HISTORY: Family History  Problem Relation Age of Onset   Stroke Brother    Congestive Heart Failure Brother     ALLERGIES:  has No Known Allergies.  MEDICATIONS:  Current Outpatient Medications  Medication Sig Dispense Refill   ALFUZOSIN HCL ER PO Take 1 tablet by mouth daily after  supper.     aspirin EC 81 MG tablet Take 81 mg by mouth daily. Swallow whole.     B Complex Vitamins (B COMPLEX PO) Take 1 tablet by mouth daily.     Cholecalciferol (VITAMIN D3) 50 MCG (2000 UT) TABS Take 2,000 Units by mouth daily.     docusate sodium (COLACE) 100 MG capsule Take 100 mg by mouth 2 (two) times daily.     FIBER PO Take 1 capsule by mouth daily. Unknown strength     fish oil-omega-3 fatty acids 1000 MG capsule Take 1 g by mouth 2 (two) times daily. Strength 300mg /1500mg      Flaxseed, Linseed, (FLAXSEED OIL) 1000 MG CAPS Take 1,000 mg by mouth daily.     GLUCOSAMINE CHONDROITIN MSM PO Take 1 tablet by mouth daily. Strength 1500/1500     lidocaine-prilocaine (EMLA) cream Apply to affected area once (Patient taking differently: Apply 1 Application topically once. Apply to affected area once) 30 g 3   Misc Natural Products (PROSTATE SUPPORT PO) Take 1 capsule by mouth daily. Unknown strength     Multiple Vitamin (MULTIVITAMIN) tablet Take 1 tablet by mouth daily. Unknown strength     ondansetron (ZOFRAN) 8 MG tablet Take 1 tablet (8 mg total) by mouth every 8 (eight) hours as needed for nausea or vomiting. 30 tablet 1   prochlorperazine (COMPAZINE) 10 MG tablet Take 1 tablet (10 mg total) by mouth every 6 (six) hours as needed for nausea or vomiting. 30 tablet 1   psyllium (METAMUCIL) 58.6 % powder Take 1 packet by mouth daily.     senna-docusate (SENNA S) 8.6-50 MG tablet Take 2 tablets by mouth at bedtime. 60 tablet 1   traMADol (ULTRAM) 50 MG tablet Take 50 mg by mouth as needed for moderate pain or severe pain.     Turmeric (QC TUMERIC COMPLEX PO) Take 750 mg by mouth daily.     vitamin C (ASCORBIC ACID) 250 MG tablet Take 500 mg by mouth daily.     No current facility-administered medications for this visit.   Facility-Administered Medications Ordered in Other Visits  Medication Dose Route Frequency Provider Last Rate Last Admin   sodium chloride flush (NS) 0.9 % injection 10  mL  10 mL Intracatheter Once Brunetta Genera, MD        REVIEW OF SYSTEMS:   10 Point review of Systems was done is negative except as noted above.   PHYSICAL EXAMINATION: .BP 104/60 (BP Location: Left Arm, Patient Position: Sitting)   Pulse 70   Temp 97.6 F (36.4 C) (Temporal)   Resp 17   Ht 5\' 7"  (1.702 m)   Wt 165 lb 8 oz (75.1 kg)   SpO2 100%   BMI  25.92 kg/m  NAD GENERAL:alert, in no acute distress and comfortable SKIN: no acute rashes, no significant lesions EYES: conjunctiva are pink and non-injected, sclera anicteric NECK: supple, no JVD LYMPH:  no palpable lymphadenopathy in the cervical, axillary or inguinal regions LUNGS: clear to auscultation b/l with normal respiratory effort HEART: regular rate & rhythm ABDOMEN:  normoactive bowel sounds , non tender, not distended. Splenomegaly  Extremity: no pedal edema PSYCH: alert & oriented x 3 with fluent speech NEURO: no focal motor/sensory deficits  LABORATORY DATA:  I have reviewed the data as listed .    Latest Ref Rng & Units 12/30/2022    7:40 AM 12/27/2022    8:44 AM 12/23/2022    8:48 AM  CBC  WBC 4.0 - 10.5 K/uL 5.1  5.0  4.8   Hemoglobin 13.0 - 17.0 g/dL 7.0  7.6  7.6   Hematocrit 39.0 - 52.0 % 21.2  22.9  22.9   Platelets 150 - 400 K/uL 503  568  682      Iron/TIBC/Ferritin/ %Sat    Component Value Date/Time   IRON 180 09/30/2022 0924   TIBC 193 (L) 09/30/2022 0924   FERRITIN 1,450 (H) 09/30/2022 0924   IRONPCTSAT 93 (H) 09/30/2022 0924  .[name] Kitchen    Latest Ref Rng & Units 12/27/2022    9:01 AM 11/29/2022   11:53 AM 11/18/2022    3:08 PM  CMP  Glucose 70 - 99 mg/dL 114  98  192   BUN 8 - 23 mg/dL 22  19  17    Creatinine 0.61 - 1.24 mg/dL 0.87  0.84  0.81   Sodium 135 - 145 mmol/L 137  137  135   Potassium 3.5 - 5.1 mmol/L 4.4  4.5  4.7   Chloride 98 - 111 mmol/L 106  105  104   CO2 22 - 32 mmol/L 28  28  25    Calcium 8.9 - 10.3 mg/dL 8.5  9.0  8.3   Total Protein 6.5 - 8.1 g/dL 5.6  5.8  5.9    Total Bilirubin 0.3 - 1.2 mg/dL 0.6  0.6  1.1   Alkaline Phos 38 - 126 U/L 76  100  92   AST 15 - 41 U/L 12  12  9    ALT 0 - 44 U/L 20  20  13     . Lab Results  Component Value Date   LDH 110 01/04/2022    02/23/2021 BCR ABL    02/23/2021 JAK2   Surgical Pathology  CASE: [id number]  PATIENT: Aquarius Mastro  Bone Marrow Report  Clinical History: MPN with progressive anemia  (BH)  DIAGNOSIS:   BONE MARROW, ASPIRATE, CLOT, CORE:  -Hypercellular bone marrow with features of myeloid neoplasm  -See comment   PERIPHERAL BLOOD:  -Macrocytic anemia  -Leukocytosis  -Thrombocytosis   COMMENT:   The bone marrow/peripheral blood show persistent involvement by  previously known myeloid neoplasm.  There is a myeloproliferative  component as supported by previous JAK2 positivity.  However, there are  also dyspoietic changes primarily involving the megakaryocytic cell line  with numerous hypolobated/unilobated forms in addition to  dysgranulopoiesis to a lesser extent.  This is associated with  eosinophilia.  It is not entirely clear whether the overall findings  represent a myeloproliferative neoplasm with treatment related changes  or represent a primary myeloproliferative/myelodysplastic neoplasm  including but not limited to myeloid neoplasms with eosinophilia.  Correlation with cytogenetic and FISH studies strongly recommended.      RADIOGRAPHIC  STUDIES: I have personally reviewed the radiological images as listed and agreed with the findings in the report. No results found.   IR IMAGING GUIDED PORT INSERTION  Result Date: 09/14/2022 INDICATION: Poor IV access.  Myelodysplastic syndrome EXAM: IMPLANTED PORT A CATH PLACEMENT WITH ULTRASOUND AND FLUOROSCOPIC GUIDANCE MEDICATIONS: None ANESTHESIA/SEDATION: Moderate (conscious) sedation was employed during this procedure. A total of Versed 3 mg and Fentanyl 100 mcg was administered intravenously. Moderate Sedation Time: 20  minutes. The patient's level of consciousness and vital signs were monitored continuously by radiology nursing throughout the procedure under my direct supervision. FLUOROSCOPY TIME:  Fluoroscopic dose; 1 mGy COMPLICATIONS: None immediate. PROCEDURE: The procedure, risks, benefits, and alternatives were explained to the patient. Questions regarding the procedure were encouraged and answered. The patient understands and consents to the procedure. The RIGHT neck and chest were prepped with chlorhexidine in a sterile fashion, and a sterile drape was applied covering the operative field. Maximum barrier sterile technique with sterile gowns and gloves were used for the procedure. A timeout was performed prior to the initiation of the procedure. Local anesthesia was provided with 1% lidocaine with epinephrine. After creating a small venotomy incision, a micropuncture kit was utilized to access the internal jugular vein under direct, real-time ultrasound guidance. Ultrasound image documentation was performed. The microwire was kinked to measure appropriate catheter length. A subcutaneous port pocket was then created along the upper chest wall utilizing a combination of sharp and blunt dissection. The pocket was irrigated with sterile saline. A single lumen ISP power injectable port was chosen for placement. The 8 Fr catheter was tunneled from the port pocket site to the venotomy incision. The port was placed in the pocket. The external catheter was trimmed to appropriate length. At the venotomy, an 8 Fr peel-away sheath was placed over a guidewire under fluoroscopic guidance. The catheter was then placed through the sheath and the sheath was removed. Final catheter positioning was confirmed and documented with a fluoroscopic spot radiograph. The port was accessed with a Huber needle, aspirated and flushed with heparinized saline. The port pocket incision was closed with interrupted 3-0 Vicryl suture then Dermabond was  applied, including at the venotomy incision. Dressings were placed. The patient tolerated the procedure well without immediate post procedural complication. IMPRESSION: Successful placement of a RIGHT internal jugular approach power injectable Port-A-Cath. The tip of the catheter is positioned within the proximal RIGHT atrium. The catheter is ready for immediate use. Michaelle Birks, MD [facility] Electronically Signed   By: Michaelle Birks M.D.   On: 09/14/2022 17:18   CT ANGIO GI BLEED  Result Date: 08/31/2022 CLINICAL DATA:  Acute mesenteric ischemia. Abdominal pain. History of myelodysplastic syndrome. EXAM: CTA ABDOMEN AND PELVIS WITHOUT AND WITH CONTRAST TECHNIQUE: Multidetector CT imaging of the abdomen and pelvis was performed using the standard protocol during bolus administration of intravenous contrast. Multiplanar reconstructed images and MIPs were obtained and reviewed to evaluate the vascular anatomy. RADIATION DOSE REDUCTION: This exam was performed according to the departmental dose-optimization program which includes automated exposure control, adjustment of the mA and/or kV according to patient size and/or use of iterative reconstruction technique. CONTRAST:  173mL OMNIPAQUE IOHEXOL 350 MG/ML SOLN COMPARISON:  Abdominal ultrasound examination 04/03/2021 FINDINGS: VASCULAR Aorta: Normal caliber abdominal aorta. Scattered atherosclerotic calcifications. No dissection. Celiac: Normal SMA: Normal Renals: Normal IMA: Patent Inflow: Normal Proximal Outflow: Normal Veins: Normal Review of the MIP images confirms the above findings. NON-VASCULAR Lower chest: Small left  pleural effusion and bibasilar atelectasis. The heart is normal in size. No pericardial effusion. There is a moderate to large hiatal hernia. Hepatobiliary: No hepatic lesions or intrahepatic biliary dilatation. The gallbladder is unremarkable. No common bile duct dilatation.  Pancreas: No mass, inflammation or ductal dilatation. Spleen: Massive splenomegaly. The spleen measures 20 x 16 x 11 cm. No splenic lesions or splenic infarct. Adrenals/Urinary Tract: The left kidney is displaced medially and inferiorly by the enlarged spleen. No worrisome renal lesions,, hydronephrosis or pyelonephritis. There is a lower pole right renal calculus and there are 2 distal left ureteral calculi more proximal calculus measures 4 mm and the more distal calculus measures 5 mm. I do not see any hydroureter or hydronephrosis. Stomach/Bowel: The stomach, duodenum, small bowel and colon are grossly normal. No inflammatory changes, mass lesions or obstructive findings. Lymphatic: No abdominal or pelvic lymphadenopathy. Reproductive: The prostate gland is enlarged. The seminal vesicles are unremarkable. Other: No pelvic mass or adenopathy. No free pelvic fluid collections. No inguinal mass or adenopathy. No abdominal wall hernia or subcutaneous lesions. Musculoskeletal: No significant bony findings. IMPRESSION: 1. Normal caliber abdominal aorta and no dissection. The branch vessels are normal. 2. Splenomegaly. 3. Two distal left ureteral calculi but no hydroureter or hydronephrosis. 4. Lower pole right renal calculus. 5. Moderate to large hiatal hernia. 6. Small left pleural effusion and bibasilar atelectasis. Electronically Signed   By: Marijo Sanes M.D.   On: 08/31/2022 18:13     ASSESSMENT & PLAN:    #1 JAK2-positive myeloproliferative neoplasm-primarily presenting with thrombocytosis and associated MDS causing refractory anemia -No polycythemia.  -Mild leukocytosis.  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 Has grade 1 out of 3 reticulin fibrosis.  Uncertain if this is primary or secondary. -LDH has remained within normal limits. 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 -CT Angio GI bleed scan from 09/01/2022 showed kidney stones.and significant  splenomegaly  PLAN: -Discussed lab results from today, 12/27/2022, with the patient. CBC shows patient is anemic at hemoglobin of 7.6, decreased hematocrit of 22.9, and elevated platelets of 568. CMP is stable. -Continue blood transfusion as needed every Thursday.  -Answered all of patient's questions.  -patient is tolerating his Vidaza treatment well without any toxicities.  -Patient can proceed with Cycle 5 of Vidaza without any dose modification. Orders reviewde and signed. -Continue monitoring weekly labs and transfuse as needed for hemoglobin less than 7.5. 1 blood unit weekly as needed.     Follow-up: Portflush, labs and 1 unit of PRBC every Thursday x 12 Plz schedule C6 and C7 of Vidaza per integrated scheduling   The total time spent in the appointment was 30 minutes* .  All of the patient's questions were answered with apparent satisfaction. The patient knows to call the clinic with any problems, questions or concerns.   Sullivan Lone MD MS AAHIVMS [facility] Hematology/Oncology Physician [facility]  .*Total Encounter Time as defined by the Centers for Medicare and Medicaid Services includes, in addition to the face-to-face time of a patient visit (documented in the note above) non-face-to-face time: obtaining and reviewing outside history, ordering and reviewing medications, tests or procedures, care coordination (communications with other health care professionals or caregivers) and documentation in the medical record.   I, Cleda Mccreedy, am acting as a Education administrator for Sullivan Lone, MD. .I have reviewed the above documentation for accuracy and completeness, and I agree with the above. Brunetta Genera MD

## 2022-12-28 ENCOUNTER — Other Ambulatory Visit: Payer: Self-pay

## 2022-12-28 ENCOUNTER — Inpatient Hospital Stay: Payer: Medicare Other

## 2022-12-28 VITALS — BP 119/60 | HR 73 | Temp 97.8°F | Resp 18

## 2022-12-28 DIAGNOSIS — K449 Diaphragmatic hernia without obstruction or gangrene: Secondary | ICD-10-CM | POA: Diagnosis not present

## 2022-12-28 DIAGNOSIS — Z7982 Long term (current) use of aspirin: Secondary | ICD-10-CM | POA: Diagnosis not present

## 2022-12-28 DIAGNOSIS — R161 Splenomegaly, not elsewhere classified: Secondary | ICD-10-CM | POA: Diagnosis not present

## 2022-12-28 DIAGNOSIS — M1711 Unilateral primary osteoarthritis, right knee: Secondary | ICD-10-CM | POA: Diagnosis not present

## 2022-12-28 DIAGNOSIS — K5900 Constipation, unspecified: Secondary | ICD-10-CM | POA: Diagnosis not present

## 2022-12-28 DIAGNOSIS — M25472 Effusion, left ankle: Secondary | ICD-10-CM | POA: Diagnosis not present

## 2022-12-28 DIAGNOSIS — D469 Myelodysplastic syndrome, unspecified: Secondary | ICD-10-CM | POA: Diagnosis not present

## 2022-12-28 DIAGNOSIS — D649 Anemia, unspecified: Secondary | ICD-10-CM

## 2022-12-28 DIAGNOSIS — J90 Pleural effusion, not elsewhere classified: Secondary | ICD-10-CM | POA: Diagnosis not present

## 2022-12-28 DIAGNOSIS — K219 Gastro-esophageal reflux disease without esophagitis: Secondary | ICD-10-CM | POA: Diagnosis not present

## 2022-12-28 DIAGNOSIS — D75839 Thrombocytosis, unspecified: Secondary | ICD-10-CM | POA: Diagnosis not present

## 2022-12-28 DIAGNOSIS — M25471 Effusion, right ankle: Secondary | ICD-10-CM | POA: Diagnosis not present

## 2022-12-28 DIAGNOSIS — Z79899 Other long term (current) drug therapy: Secondary | ICD-10-CM | POA: Diagnosis not present

## 2022-12-28 DIAGNOSIS — Z87891 Personal history of nicotine dependence: Secondary | ICD-10-CM | POA: Diagnosis not present

## 2022-12-28 MED ORDER — HEPARIN SOD (PORK) LOCK FLUSH 100 UNIT/ML IV SOLN
500.0000 [IU] | Freq: Once | INTRAVENOUS | Status: AC | PRN
  Administered 2022-12-28: 500 [IU]

## 2022-12-28 MED ORDER — ONDANSETRON HCL 8 MG PO TABS: 8.0000 mg | ORAL_TABLET | Freq: Once | ORAL | Status: DC

## 2022-12-28 MED ORDER — SODIUM CHLORIDE 0.9 % IV SOLN: Freq: Once | INTRAVENOUS | Status: AC

## 2022-12-28 MED ORDER — SODIUM CHLORIDE 0.9% FLUSH
10.0000 mL | INTRAVENOUS | Status: DC | PRN
  Administered 2022-12-28: 10 mL

## 2022-12-28 MED ORDER — SODIUM CHLORIDE 0.9 % IV SOLN
75.0000 mg/m2 | Freq: Once | INTRAVENOUS | Status: AC
  Administered 2022-12-28: 141 mg via INTRAVENOUS
  Filled 2022-12-28: qty 14.1

## 2022-12-28 NOTE — Patient Instructions (Signed)
[facility]  Discharge Instructions: Thank you for choosing [facility] to provide your oncology and hematology care.   If you have a lab appointment with the Cancer Center, please go directly to the Cancer Center and check in at the registration area.   Wear comfortable clothing and clothing appropriate for easy access to any Portacath or PICC line.   We strive to give you quality time with your provider. You may need to reschedule your appointment if you arrive late (15 or more minutes).  Arriving late affects you and other patients whose appointments are after yours.  Also, if you miss three or more appointments without notifying the office, you may be dismissed from the clinic at the provider's discretion.      For prescription refill requests, have your pharmacy contact our office and allow 72 hours for refills to be completed.    Today you received the following chemotherapy and/or immunotherapy agents: Vidaza     To help prevent nausea and vomiting after your treatment, we encourage you to take your nausea medication as directed.  BELOW ARE SYMPTOMS THAT SHOULD BE REPORTED IMMEDIATELY: *FEVER GREATER THAN 100.4 F (38 C) OR HIGHER *CHILLS OR SWEATING *NAUSEA AND VOMITING THAT IS NOT CONTROLLED WITH YOUR NAUSEA MEDICATION *UNUSUAL SHORTNESS OF BREATH *UNUSUAL BRUISING OR BLEEDING *URINARY PROBLEMS (pain or burning when urinating, or frequent urination) *BOWEL PROBLEMS (unusual diarrhea, constipation, pain near the anus) TENDERNESS IN MOUTH AND THROAT WITH OR WITHOUT PRESENCE OF ULCERS (sore throat, sores in mouth, or a toothache) UNUSUAL RASH, SWELLING OR PAIN  UNUSUAL VAGINAL DISCHARGE OR ITCHING   Items with * indicate a potential emergency and should be followed up as soon as possible or go to the Emergency Department if any problems should occur.  Please show the CHEMOTHERAPY ALERT CARD or IMMUNOTHERAPY ALERT CARD at check-in  to the Emergency Department and triage nurse.  Should you have questions after your visit or need to cancel or reschedule your appointment, please contact [facility]  Dept: [phone]  and follow the prompts.  Office hours are 8:00 a.m. to 4:30 p.m. Monday - Friday. Please note that voicemails left after 4:00 p.m. may not be returned until the following business day.  We are closed weekends and major holidays. You have access to a nurse at all times for urgent questions. Please call the main number to the clinic Dept: [phone] and follow the prompts.   For any non-urgent questions, you may also contact your provider using MyChart. We now offer e-Visits for anyone 18 and older to request care online for non-urgent symptoms. For details visit [email or web address].   Also download the MyChart app! Go to the app store, search "MyChart", open the app, select [facility], and log in with your MyChart username and password.  

## 2022-12-29 ENCOUNTER — Inpatient Hospital Stay: Payer: Medicare Other

## 2022-12-29 ENCOUNTER — Other Ambulatory Visit: Payer: Self-pay

## 2022-12-29 VITALS — BP 118/56 | HR 71 | Temp 97.7°F | Resp 16

## 2022-12-29 DIAGNOSIS — Z7982 Long term (current) use of aspirin: Secondary | ICD-10-CM | POA: Diagnosis not present

## 2022-12-29 DIAGNOSIS — Z79899 Other long term (current) drug therapy: Secondary | ICD-10-CM | POA: Diagnosis not present

## 2022-12-29 DIAGNOSIS — J90 Pleural effusion, not elsewhere classified: Secondary | ICD-10-CM | POA: Diagnosis not present

## 2022-12-29 DIAGNOSIS — M25471 Effusion, right ankle: Secondary | ICD-10-CM | POA: Diagnosis not present

## 2022-12-29 DIAGNOSIS — K449 Diaphragmatic hernia without obstruction or gangrene: Secondary | ICD-10-CM | POA: Diagnosis not present

## 2022-12-29 DIAGNOSIS — R161 Splenomegaly, not elsewhere classified: Secondary | ICD-10-CM | POA: Diagnosis not present

## 2022-12-29 DIAGNOSIS — D75839 Thrombocytosis, unspecified: Secondary | ICD-10-CM | POA: Diagnosis not present

## 2022-12-29 DIAGNOSIS — D649 Anemia, unspecified: Secondary | ICD-10-CM

## 2022-12-29 DIAGNOSIS — M1711 Unilateral primary osteoarthritis, right knee: Secondary | ICD-10-CM | POA: Diagnosis not present

## 2022-12-29 DIAGNOSIS — D469 Myelodysplastic syndrome, unspecified: Secondary | ICD-10-CM | POA: Diagnosis not present

## 2022-12-29 DIAGNOSIS — K219 Gastro-esophageal reflux disease without esophagitis: Secondary | ICD-10-CM | POA: Diagnosis not present

## 2022-12-29 DIAGNOSIS — M25472 Effusion, left ankle: Secondary | ICD-10-CM | POA: Diagnosis not present

## 2022-12-29 DIAGNOSIS — K5900 Constipation, unspecified: Secondary | ICD-10-CM | POA: Diagnosis not present

## 2022-12-29 DIAGNOSIS — Z87891 Personal history of nicotine dependence: Secondary | ICD-10-CM | POA: Diagnosis not present

## 2022-12-29 MED ORDER — SODIUM CHLORIDE 0.9 % IV SOLN: Freq: Once | INTRAVENOUS | Status: AC

## 2022-12-29 MED ORDER — HEPARIN SOD (PORK) LOCK FLUSH 100 UNIT/ML IV SOLN
500.0000 [IU] | Freq: Once | INTRAVENOUS | Status: AC | PRN
  Administered 2022-12-29: 500 [IU]

## 2022-12-29 MED ORDER — SODIUM CHLORIDE 0.9 % IV SOLN
75.0000 mg/m2 | Freq: Once | INTRAVENOUS | Status: AC
  Administered 2022-12-29: 141 mg via INTRAVENOUS
  Filled 2022-12-29: qty 14.1

## 2022-12-29 MED ORDER — SODIUM CHLORIDE 0.9% FLUSH
10.0000 mL | INTRAVENOUS | Status: DC | PRN
  Administered 2022-12-29: 10 mL

## 2022-12-29 NOTE — Progress Notes (Signed)
Pt reports he took Zofran at home prior to arrival today.

## 2022-12-29 NOTE — Patient Instructions (Signed)
[facility]  Discharge Instructions: Thank you for choosing [facility] to provide your oncology and hematology care.   If you have a lab appointment with the [facility], please go directly to the [facility] and check in at the registration area.   Wear comfortable clothing and clothing appropriate for easy access to any Portacath or PICC line.   We strive to give you quality time with your provider. You may need to reschedule your appointment if you arrive late (15 or more minutes).  Arriving late affects you and other patients whose appointments are after yours.  Also, if you miss three or more appointments without notifying the office, you may be dismissed from the clinic at the provider's discretion.      For prescription refill requests, have your pharmacy contact our office and allow 72 hours for refills to be completed.    Today you received the following chemotherapy and/or immunotherapy agents: azacitidine      To help prevent nausea and vomiting after your treatment, we encourage you to take your nausea medication as directed.  BELOW ARE SYMPTOMS THAT SHOULD BE REPORTED IMMEDIATELY: *FEVER GREATER THAN 100.4 F (38 C) OR HIGHER *CHILLS OR SWEATING *NAUSEA AND VOMITING THAT IS NOT CONTROLLED WITH YOUR NAUSEA MEDICATION *UNUSUAL SHORTNESS OF BREATH *UNUSUAL BRUISING OR BLEEDING *URINARY PROBLEMS (pain or burning when urinating, or frequent urination) *BOWEL PROBLEMS (unusual diarrhea, constipation, pain near the anus) TENDERNESS IN MOUTH AND THROAT WITH OR WITHOUT PRESENCE OF ULCERS (sore throat, sores in mouth, or a toothache) UNUSUAL RASH, SWELLING OR PAIN  UNUSUAL VAGINAL DISCHARGE OR ITCHING   Items with * indicate a potential emergency and should be followed up as soon as possible or go to the Emergency Department if any problems should occur.  Please show the CHEMOTHERAPY ALERT CARD or IMMUNOTHERAPY ALERT CARD at  check-in to the Emergency Department and triage nurse.  Should you have questions after your visit or need to cancel or reschedule your appointment, please contact [facility]  Dept: [phone]  and follow the prompts.  Office hours are 8:00 a.m. to 4:30 p.m. Monday - Friday. Please note that voicemails left after 4:00 p.m. may not be returned until the following business day.  We are closed weekends and major holidays. You have access to a nurse at all times for urgent questions. Please call the main number to the clinic Dept: [phone] and follow the prompts.   For any non-urgent questions, you may also contact your provider using MyChart. We now offer e-Visits for anyone 24 and older to request care online for non-urgent symptoms. For details visit [email or web address].   Also download the MyChart app! Go to the app store, search "MyChart", open the app, select [facility], and log in with your MyChart username and password.

## 2022-12-30 ENCOUNTER — Inpatient Hospital Stay: Payer: Medicare Other

## 2022-12-30 VITALS — BP 97/55 | HR 66 | Temp 98.1°F | Resp 16 | Ht 67.0 in | Wt 165.0 lb

## 2022-12-30 DIAGNOSIS — M25472 Effusion, left ankle: Secondary | ICD-10-CM | POA: Diagnosis not present

## 2022-12-30 DIAGNOSIS — R161 Splenomegaly, not elsewhere classified: Secondary | ICD-10-CM | POA: Diagnosis not present

## 2022-12-30 DIAGNOSIS — D469 Myelodysplastic syndrome, unspecified: Secondary | ICD-10-CM | POA: Diagnosis not present

## 2022-12-30 DIAGNOSIS — D649 Anemia, unspecified: Secondary | ICD-10-CM

## 2022-12-30 DIAGNOSIS — Z79899 Other long term (current) drug therapy: Secondary | ICD-10-CM | POA: Diagnosis not present

## 2022-12-30 DIAGNOSIS — J90 Pleural effusion, not elsewhere classified: Secondary | ICD-10-CM | POA: Diagnosis not present

## 2022-12-30 DIAGNOSIS — K5900 Constipation, unspecified: Secondary | ICD-10-CM | POA: Diagnosis not present

## 2022-12-30 DIAGNOSIS — D75839 Thrombocytosis, unspecified: Secondary | ICD-10-CM | POA: Diagnosis not present

## 2022-12-30 DIAGNOSIS — M25471 Effusion, right ankle: Secondary | ICD-10-CM | POA: Diagnosis not present

## 2022-12-30 DIAGNOSIS — K449 Diaphragmatic hernia without obstruction or gangrene: Secondary | ICD-10-CM | POA: Diagnosis not present

## 2022-12-30 DIAGNOSIS — Z7982 Long term (current) use of aspirin: Secondary | ICD-10-CM | POA: Diagnosis not present

## 2022-12-30 DIAGNOSIS — K219 Gastro-esophageal reflux disease without esophagitis: Secondary | ICD-10-CM | POA: Diagnosis not present

## 2022-12-30 DIAGNOSIS — Z87891 Personal history of nicotine dependence: Secondary | ICD-10-CM | POA: Diagnosis not present

## 2022-12-30 DIAGNOSIS — M1711 Unilateral primary osteoarthritis, right knee: Secondary | ICD-10-CM | POA: Diagnosis not present

## 2022-12-30 LAB — CBC WITH DIFFERENTIAL (CANCER CENTER ONLY)
Abs Immature Granulocytes: 0.29 10*3/uL — ABNORMAL HIGH (ref 0.00–0.07)
Basophils Absolute: 0.2 10*3/uL — ABNORMAL HIGH (ref 0.0–0.1)
Basophils Relative: 5 %
Eosinophils Absolute: 0.7 10*3/uL — ABNORMAL HIGH (ref 0.0–0.5)
Eosinophils Relative: 14 %
HCT: 21.2 % — ABNORMAL LOW (ref 39.0–52.0)
Hemoglobin: 7 g/dL — ABNORMAL LOW (ref 13.0–17.0)
Immature Granulocytes: 6 %
Lymphocytes Relative: 11 %
Lymphs Abs: 0.6 10*3/uL — ABNORMAL LOW (ref 0.7–4.0)
MCH: 29.5 pg (ref 26.0–34.0)
MCHC: 33 g/dL (ref 30.0–36.0)
MCV: 89.5 fL (ref 80.0–100.0)
Monocytes Absolute: 0.3 10*3/uL (ref 0.1–1.0)
Monocytes Relative: 6 %
Neutro Abs: 3 10*3/uL (ref 1.7–7.7)
Neutrophils Relative %: 58 %
Platelet Count: 503 10*3/uL — ABNORMAL HIGH (ref 150–400)
RBC: 2.37 MIL/uL — ABNORMAL LOW (ref 4.22–5.81)
RDW: 14.5 % (ref 11.5–15.5)
WBC Count: 5.1 10*3/uL (ref 4.0–10.5)
nRBC: 0 % (ref 0.0–0.2)

## 2022-12-30 LAB — PREPARE RBC (CROSSMATCH)

## 2022-12-30 MED ORDER — ONDANSETRON HCL 8 MG PO TABS: 8.0000 mg | ORAL_TABLET | Freq: Once | ORAL | Status: DC

## 2022-12-30 MED ORDER — SODIUM CHLORIDE 0.9% FLUSH
10.0000 mL | INTRAVENOUS | Status: DC | PRN
  Administered 2022-12-30: 10 mL

## 2022-12-30 MED ORDER — ACETAMINOPHEN 325 MG PO TABS
650.0000 mg | ORAL_TABLET | Freq: Once | ORAL | Status: AC
  Administered 2022-12-30: 650 mg via ORAL
  Filled 2022-12-30: qty 2

## 2022-12-30 MED ORDER — SODIUM CHLORIDE 0.9% FLUSH
10.0000 mL | Freq: Once | INTRAVENOUS | Status: AC
  Administered 2022-12-30: 10 mL

## 2022-12-30 MED ORDER — SODIUM CHLORIDE 0.9 % IV SOLN: Freq: Once | INTRAVENOUS | Status: AC

## 2022-12-30 MED ORDER — HEPARIN SOD (PORK) LOCK FLUSH 100 UNIT/ML IV SOLN
500.0000 [IU] | Freq: Once | INTRAVENOUS | Status: AC | PRN
  Administered 2022-12-30: 500 [IU]

## 2022-12-30 MED ORDER — SODIUM CHLORIDE 0.9 % IV SOLN
75.0000 mg/m2 | Freq: Once | INTRAVENOUS | Status: AC
  Administered 2022-12-30: 141 mg via INTRAVENOUS
  Filled 2022-12-30: qty 14.1

## 2022-12-30 MED ORDER — METHYLPREDNISOLONE SODIUM SUCC 40 MG IJ SOLR
40.0000 mg | Freq: Once | INTRAMUSCULAR | Status: AC
  Administered 2022-12-30: 40 mg via INTRAVENOUS
  Filled 2022-12-30: qty 1

## 2022-12-30 MED ORDER — SODIUM CHLORIDE 0.9% IV SOLUTION
250.0000 mL | Freq: Once | INTRAVENOUS | Status: AC
  Administered 2022-12-30: 250 mL via INTRAVENOUS

## 2022-12-30 NOTE — Patient Instructions (Signed)
[facility]  Discharge Instructions: Thank you for choosing [facility] to provide your oncology and hematology care.   If you have a lab appointment with the [facility], please go directly to the [facility] and check in at the registration area.   Wear comfortable clothing and clothing appropriate for easy access to any Portacath or PICC line.   We strive to give you quality time with your provider. You may need to reschedule your appointment if you arrive late (15 or more minutes).  Arriving late affects you and other patients whose appointments are after yours.  Also, if you miss three or more appointments without notifying the office, you may be dismissed from the clinic at the provider's discretion.      For prescription refill requests, have your pharmacy contact our office and allow 72 hours for refills to be completed.    Today you received the following chemotherapy and/or immunotherapy agents: azacitidine      To help prevent nausea and vomiting after your treatment, we encourage you to take your nausea medication as directed.  BELOW ARE SYMPTOMS THAT SHOULD BE REPORTED IMMEDIATELY: *FEVER GREATER THAN 100.4 F (38 C) OR HIGHER *CHILLS OR SWEATING *NAUSEA AND VOMITING THAT IS NOT CONTROLLED WITH YOUR NAUSEA MEDICATION *UNUSUAL SHORTNESS OF BREATH *UNUSUAL BRUISING OR BLEEDING *URINARY PROBLEMS (pain or burning when urinating, or frequent urination) *BOWEL PROBLEMS (unusual diarrhea, constipation, pain near the anus) TENDERNESS IN MOUTH AND THROAT WITH OR WITHOUT PRESENCE OF ULCERS (sore throat, sores in mouth, or a toothache) UNUSUAL RASH, SWELLING OR PAIN  UNUSUAL VAGINAL DISCHARGE OR ITCHING   Items with * indicate a potential emergency and should be followed up as soon as possible or go to the Emergency Department if any problems should occur.  Please show the CHEMOTHERAPY ALERT CARD or IMMUNOTHERAPY ALERT CARD at  check-in to the Emergency Department and triage nurse.  Should you have questions after your visit or need to cancel or reschedule your appointment, please contact [facility]  Dept: [phone]  and follow the prompts.  Office hours are 8:00 a.m. to 4:30 p.m. Monday - Friday. Please note that voicemails left after 4:00 p.m. may not be returned until the following business day.  We are closed weekends and major holidays. You have access to a nurse at all times for urgent questions. Please call the main number to the clinic Dept: [phone] and follow the prompts.   For any non-urgent questions, you may also contact your provider using MyChart. We now offer e-Visits for anyone 71 and older to request care online for non-urgent symptoms. For details visit [email or web address].   Also download the MyChart app! Go to the app store, search "MyChart", open the app, select [facility], and log in with your MyChart username and password.

## 2022-12-30 NOTE — Progress Notes (Signed)
Patient states he took Zofran prior to arrival today.

## 2022-12-31 ENCOUNTER — Inpatient Hospital Stay: Payer: Medicare Other

## 2022-12-31 VITALS — BP 96/55 | HR 58 | Temp 97.7°F | Resp 18 | Wt 164.2 lb

## 2022-12-31 DIAGNOSIS — M25472 Effusion, left ankle: Secondary | ICD-10-CM | POA: Diagnosis not present

## 2022-12-31 DIAGNOSIS — K5900 Constipation, unspecified: Secondary | ICD-10-CM | POA: Diagnosis not present

## 2022-12-31 DIAGNOSIS — D75839 Thrombocytosis, unspecified: Secondary | ICD-10-CM | POA: Diagnosis not present

## 2022-12-31 DIAGNOSIS — Z87891 Personal history of nicotine dependence: Secondary | ICD-10-CM | POA: Diagnosis not present

## 2022-12-31 DIAGNOSIS — M25471 Effusion, right ankle: Secondary | ICD-10-CM | POA: Diagnosis not present

## 2022-12-31 DIAGNOSIS — M1711 Unilateral primary osteoarthritis, right knee: Secondary | ICD-10-CM | POA: Diagnosis not present

## 2022-12-31 DIAGNOSIS — K449 Diaphragmatic hernia without obstruction or gangrene: Secondary | ICD-10-CM | POA: Diagnosis not present

## 2022-12-31 DIAGNOSIS — R161 Splenomegaly, not elsewhere classified: Secondary | ICD-10-CM | POA: Diagnosis not present

## 2022-12-31 DIAGNOSIS — D649 Anemia, unspecified: Secondary | ICD-10-CM

## 2022-12-31 DIAGNOSIS — D469 Myelodysplastic syndrome, unspecified: Secondary | ICD-10-CM | POA: Diagnosis not present

## 2022-12-31 DIAGNOSIS — K219 Gastro-esophageal reflux disease without esophagitis: Secondary | ICD-10-CM | POA: Diagnosis not present

## 2022-12-31 DIAGNOSIS — Z79899 Other long term (current) drug therapy: Secondary | ICD-10-CM | POA: Diagnosis not present

## 2022-12-31 DIAGNOSIS — J90 Pleural effusion, not elsewhere classified: Secondary | ICD-10-CM | POA: Diagnosis not present

## 2022-12-31 DIAGNOSIS — Z7982 Long term (current) use of aspirin: Secondary | ICD-10-CM | POA: Diagnosis not present

## 2022-12-31 LAB — BPAM RBC
Blood Product Expiration Date: 202404112359
ISSUE DATE / TIME: 202403140958
Unit Type and Rh: 6200

## 2022-12-31 LAB — TYPE AND SCREEN
ABO/RH(D): A POS
Antibody Screen: NEGATIVE
Unit division: 0

## 2022-12-31 MED ORDER — SODIUM CHLORIDE 0.9 % IV SOLN: Freq: Once | INTRAVENOUS | Status: AC

## 2022-12-31 MED ORDER — ONDANSETRON HCL 8 MG PO TABS: 8.0000 mg | ORAL_TABLET | Freq: Once | ORAL | Status: DC

## 2022-12-31 MED ORDER — SODIUM CHLORIDE 0.9% FLUSH
10.0000 mL | INTRAVENOUS | Status: DC | PRN
  Administered 2022-12-31: 10 mL

## 2022-12-31 MED ORDER — SODIUM CHLORIDE 0.9 % IV SOLN
75.0000 mg/m2 | Freq: Once | INTRAVENOUS | Status: AC
  Administered 2022-12-31: 141 mg via INTRAVENOUS
  Filled 2022-12-31: qty 14.1

## 2022-12-31 MED ORDER — HEPARIN SOD (PORK) LOCK FLUSH 100 UNIT/ML IV SOLN
500.0000 [IU] | Freq: Once | INTRAVENOUS | Status: AC | PRN
  Administered 2022-12-31: 500 [IU]

## 2023-01-02 ENCOUNTER — Encounter: Payer: Self-pay | Admitting: Hematology

## 2023-01-05 DIAGNOSIS — E1165 Type 2 diabetes mellitus with hyperglycemia: Secondary | ICD-10-CM | POA: Diagnosis not present

## 2023-01-05 DIAGNOSIS — I472 Ventricular tachycardia, unspecified: Secondary | ICD-10-CM | POA: Diagnosis not present

## 2023-01-06 ENCOUNTER — Other Ambulatory Visit: Payer: Self-pay

## 2023-01-06 ENCOUNTER — Inpatient Hospital Stay: Payer: Medicare Other

## 2023-01-06 ENCOUNTER — Telehealth: Payer: Self-pay

## 2023-01-06 VITALS — BP 98/61 | HR 66 | Temp 97.6°F | Resp 18

## 2023-01-06 DIAGNOSIS — M25472 Effusion, left ankle: Secondary | ICD-10-CM | POA: Diagnosis not present

## 2023-01-06 DIAGNOSIS — D469 Myelodysplastic syndrome, unspecified: Secondary | ICD-10-CM

## 2023-01-06 DIAGNOSIS — K449 Diaphragmatic hernia without obstruction or gangrene: Secondary | ICD-10-CM | POA: Diagnosis not present

## 2023-01-06 DIAGNOSIS — J90 Pleural effusion, not elsewhere classified: Secondary | ICD-10-CM | POA: Diagnosis not present

## 2023-01-06 DIAGNOSIS — R161 Splenomegaly, not elsewhere classified: Secondary | ICD-10-CM | POA: Diagnosis not present

## 2023-01-06 DIAGNOSIS — D649 Anemia, unspecified: Secondary | ICD-10-CM

## 2023-01-06 DIAGNOSIS — D75839 Thrombocytosis, unspecified: Secondary | ICD-10-CM | POA: Diagnosis not present

## 2023-01-06 DIAGNOSIS — M25471 Effusion, right ankle: Secondary | ICD-10-CM | POA: Diagnosis not present

## 2023-01-06 DIAGNOSIS — Z7982 Long term (current) use of aspirin: Secondary | ICD-10-CM | POA: Diagnosis not present

## 2023-01-06 DIAGNOSIS — Z87891 Personal history of nicotine dependence: Secondary | ICD-10-CM | POA: Diagnosis not present

## 2023-01-06 DIAGNOSIS — Z79899 Other long term (current) drug therapy: Secondary | ICD-10-CM | POA: Diagnosis not present

## 2023-01-06 DIAGNOSIS — K219 Gastro-esophageal reflux disease without esophagitis: Secondary | ICD-10-CM | POA: Diagnosis not present

## 2023-01-06 DIAGNOSIS — M1711 Unilateral primary osteoarthritis, right knee: Secondary | ICD-10-CM | POA: Diagnosis not present

## 2023-01-06 DIAGNOSIS — K5900 Constipation, unspecified: Secondary | ICD-10-CM | POA: Diagnosis not present

## 2023-01-06 LAB — CBC WITH DIFFERENTIAL (CANCER CENTER ONLY)
Abs Immature Granulocytes: 0.15 10*3/uL — ABNORMAL HIGH (ref 0.00–0.07)
Basophils Absolute: 0.1 10*3/uL (ref 0.0–0.1)
Basophils Relative: 2 %
Eosinophils Absolute: 0.3 10*3/uL (ref 0.0–0.5)
Eosinophils Relative: 10 %
HCT: 18.6 % — ABNORMAL LOW (ref 39.0–52.0)
Hemoglobin: 6 g/dL — CL (ref 13.0–17.0)
Immature Granulocytes: 5 %
Lymphocytes Relative: 10 %
Lymphs Abs: 0.3 10*3/uL — ABNORMAL LOW (ref 0.7–4.0)
MCH: 28.3 pg (ref 26.0–34.0)
MCHC: 32.3 g/dL (ref 30.0–36.0)
MCV: 87.7 fL (ref 80.0–100.0)
Monocytes Absolute: 0.1 10*3/uL (ref 0.1–1.0)
Monocytes Relative: 4 %
Neutro Abs: 2.1 10*3/uL (ref 1.7–7.7)
Neutrophils Relative %: 69 %
Platelet Count: 191 10*3/uL (ref 150–400)
RBC: 2.12 MIL/uL — ABNORMAL LOW (ref 4.22–5.81)
RDW: 14.4 % (ref 11.5–15.5)
WBC Count: 3.1 10*3/uL — ABNORMAL LOW (ref 4.0–10.5)
nRBC: 0 % (ref 0.0–0.2)

## 2023-01-06 LAB — PREPARE RBC (CROSSMATCH)

## 2023-01-06 MED ORDER — ACETAMINOPHEN 325 MG PO TABS
650.0000 mg | ORAL_TABLET | Freq: Once | ORAL | Status: AC
  Administered 2023-01-06: 650 mg via ORAL
  Filled 2023-01-06: qty 2

## 2023-01-06 MED ORDER — SODIUM CHLORIDE 0.9% FLUSH
10.0000 mL | Freq: Once | INTRAVENOUS | Status: AC
  Administered 2023-01-06: 10 mL

## 2023-01-06 MED ORDER — METHYLPREDNISOLONE SODIUM SUCC 40 MG IJ SOLR
40.0000 mg | Freq: Once | INTRAMUSCULAR | Status: AC
  Administered 2023-01-06: 40 mg via INTRAVENOUS
  Filled 2023-01-06: qty 1

## 2023-01-06 MED ORDER — SODIUM CHLORIDE 0.9% IV SOLUTION
250.0000 mL | Freq: Once | INTRAVENOUS | Status: AC
  Administered 2023-01-06: 250 mL via INTRAVENOUS

## 2023-01-06 MED ORDER — HEPARIN SOD (PORK) LOCK FLUSH 100 UNIT/ML IV SOLN
500.0000 [IU] | Freq: Once | INTRAVENOUS | Status: AC
  Administered 2023-01-06: 500 [IU]

## 2023-01-06 NOTE — Telephone Encounter (Signed)
CRITICAL VALUE STICKER  CRITICAL VALUE: Hgb 6.0  RECEIVER (on-site recipient of call): Maurine Simmering CMA  DATE & TIME NOTIFIED: 01/06/2023 [id number]  MESSENGER (representative from lab): Janett Billow in Lab  MD NOTIFIED: Dr. Irene Limbo  TIME OF NOTIFICATION: [phone]  RESPONSE: Made nurse aware

## 2023-01-06 NOTE — Patient Instructions (Signed)
Blood Transfusion, Adult A blood transfusion is a procedure in which you receive blood or a type of blood cell (blood component) through an IV. You may need a blood transfusion when you have a low blood count, which is a low number of any blood cell. This may result from a bleeding disorder, illness, injury, or surgery. The blood may come from a donor, or you may be able to have your own blood collected and stored (autologous blood donation) before a planned surgery. The blood given in a transfusion may be made up of different blood components. You may receive: Red blood cells. These carry oxygen to the cells in the body. Platelets. These help your blood to clot. Plasma. This is the liquid part of your blood. It carries proteins and other substances throughout the body. White blood cells. These help you fight infections. If you have hemophilia or another clotting disorder, you may also receive other types of blood products. Depending on the type of blood product, this procedure may take 1-4 hours to complete. Tell a health care provider about: Any bleeding problems you have. Any previous reactions you have had during a blood transfusion. Any allergies you have. All medicines you are taking, including vitamins, herbs, eye drops, creams, and over-the-counter medicines. Any surgeries you have had. Any medical conditions you have. Whether you are pregnant or may be pregnant. What are the risks? Talk with your health care provider about risks. The most common problems include: A mild allergic reaction, such as red, swollen areas of skin (hives) and itching. Fever or chills. This may be the body's response to new blood cells received. This may occur during or up to 4 hours after the transfusion. More serious problems may include: A serious allergic reaction that causes difficulty breathing or swelling around the face and lips. Transfusion-associated circulatory overload (TACO), or too much fluid in  the lungs. This may cause breathing problems. Transfusion-related acute lung injury (TRALI), which causes breathing difficulty and low oxygen in the blood. This can occur within hours of the transfusion or several days later. Iron overload. This can happen after receiving many blood transfusions over a period of time. Infection or virus being transmitted. This is rare because donated blood is carefully tested before it is given. Hemolytic transfusion reaction. This is rare. It happens when the body's defense system (immune system)tries to attack the new blood cells. Symptoms may include fever, chills, nausea, low blood pressure, and low back or chest pain. Transfusion-associated graft-versus-host disease (TAGVHD). This is rare. It happens when donated cells attack the body's healthy tissues. What happens before the procedure? You will have a blood test to check your blood type. This test is done to know what kind of blood your body will accept and to match it to the donor blood. If you are going to have a planned surgery, you may be able to do an autologous blood donation. This may be done in case you need to have a transfusion. You will have your temperature, blood pressure, and pulse checked before the transfusion. If you have had an allergic reaction to a transfusion in the past, you may be given medicine to help prevent a reaction. This medicine may be given to you by mouth (orally) or through an IV. What happens during the procedure?  An IV will be inserted into one of your veins. The bag of blood will be attached to your IV. The blood will then enter through your vein. Your temperature, blood pressure,   and pulse will be monitored during the transfusion. This monitoring is done to detect early signs of a transfusion reaction. Tell your nurse right away if you have any of these symptoms during the transfusion: Shortness of breath or trouble breathing. Chest or back pain. Fever or  chills. Itching or hives. If you have any signs or symptoms of a reaction, your transfusion will be stopped and you may be given medicine. When the transfusion is complete, your IV will be removed. Pressure may be applied to the IV site for a few minutes. A bandage (dressing)will be applied. The procedure may vary among health care providers and hospitals. What happens after the procedure? Your temperature, blood pressure, pulse, breathing rate, and blood oxygen level will be monitored until you leave the hospital or clinic. Your blood may be tested to see how you have responded to the transfusion. You may be warmed with fluids or blankets to maintain a normal body temperature. If you receive your blood transfusion in an outpatient setting, you will be told whom to contact to report any reactions. Where to find more information Visit the American Red Cross: redcross.org Summary A blood transfusion is a procedure in which you receive blood or a type of blood cell (blood component) through an IV. The blood given in a transfusion may be made up of different blood components. You may receive red blood cells, platelets, plasma, or white blood cells depending on the condition treated. Your temperature, blood pressure, and pulse will be monitored before, during, and after the transfusion. After the transfusion, your blood may be tested to see how your body has responded. This information is not intended to replace advice given to you by your health care provider. Make sure you discuss any questions you have with your health care provider. Document Revised: 01/01/2022 Document Reviewed: 01/01/2022 Elsevier Patient Education  2023 [employer] Inc.  

## 2023-01-07 ENCOUNTER — Inpatient Hospital Stay: Payer: Medicare Other

## 2023-01-07 DIAGNOSIS — D75839 Thrombocytosis, unspecified: Secondary | ICD-10-CM | POA: Diagnosis not present

## 2023-01-07 DIAGNOSIS — Z79899 Other long term (current) drug therapy: Secondary | ICD-10-CM | POA: Diagnosis not present

## 2023-01-07 DIAGNOSIS — D469 Myelodysplastic syndrome, unspecified: Secondary | ICD-10-CM | POA: Diagnosis not present

## 2023-01-07 DIAGNOSIS — K5900 Constipation, unspecified: Secondary | ICD-10-CM | POA: Diagnosis not present

## 2023-01-07 DIAGNOSIS — K219 Gastro-esophageal reflux disease without esophagitis: Secondary | ICD-10-CM | POA: Diagnosis not present

## 2023-01-07 DIAGNOSIS — Z7982 Long term (current) use of aspirin: Secondary | ICD-10-CM | POA: Diagnosis not present

## 2023-01-07 DIAGNOSIS — R161 Splenomegaly, not elsewhere classified: Secondary | ICD-10-CM | POA: Diagnosis not present

## 2023-01-07 DIAGNOSIS — M1711 Unilateral primary osteoarthritis, right knee: Secondary | ICD-10-CM | POA: Diagnosis not present

## 2023-01-07 DIAGNOSIS — M25472 Effusion, left ankle: Secondary | ICD-10-CM | POA: Diagnosis not present

## 2023-01-07 DIAGNOSIS — M25471 Effusion, right ankle: Secondary | ICD-10-CM | POA: Diagnosis not present

## 2023-01-07 DIAGNOSIS — K449 Diaphragmatic hernia without obstruction or gangrene: Secondary | ICD-10-CM | POA: Diagnosis not present

## 2023-01-07 DIAGNOSIS — J90 Pleural effusion, not elsewhere classified: Secondary | ICD-10-CM | POA: Diagnosis not present

## 2023-01-07 DIAGNOSIS — Z87891 Personal history of nicotine dependence: Secondary | ICD-10-CM | POA: Diagnosis not present

## 2023-01-07 MED ORDER — METHYLPREDNISOLONE SODIUM SUCC 40 MG IJ SOLR
40.0000 mg | Freq: Once | INTRAMUSCULAR | Status: AC
  Administered 2023-01-07: 40 mg via INTRAVENOUS
  Filled 2023-01-07: qty 1

## 2023-01-07 MED ORDER — SODIUM CHLORIDE 0.9% IV SOLUTION
250.0000 mL | Freq: Once | INTRAVENOUS | Status: AC
  Administered 2023-01-07: 250 mL via INTRAVENOUS

## 2023-01-07 MED ORDER — HEPARIN SOD (PORK) LOCK FLUSH 100 UNIT/ML IV SOLN
500.0000 [IU] | Freq: Every day | INTRAVENOUS | Status: AC | PRN
  Administered 2023-01-07: 500 [IU]

## 2023-01-07 MED ORDER — ACETAMINOPHEN 325 MG PO TABS
650.0000 mg | ORAL_TABLET | Freq: Once | ORAL | Status: AC
  Administered 2023-01-07: 650 mg via ORAL
  Filled 2023-01-07: qty 2

## 2023-01-07 MED ORDER — SODIUM CHLORIDE 0.9% FLUSH
10.0000 mL | INTRAVENOUS | Status: AC | PRN
  Administered 2023-01-07: 10 mL

## 2023-01-07 NOTE — Patient Instructions (Signed)

## 2023-01-08 LAB — TYPE AND SCREEN
ABO/RH(D): A POS
Antibody Screen: NEGATIVE
Unit division: 0
Unit division: 0

## 2023-01-08 LAB — BPAM RBC
Blood Product Expiration Date: 202404182359
Blood Product Expiration Date: 202404182359
ISSUE DATE / TIME: 202403211045
ISSUE DATE / TIME: 202403221244
Unit Type and Rh: 6200
Unit Type and Rh: 6200

## 2023-01-11 ENCOUNTER — Other Ambulatory Visit: Payer: Self-pay

## 2023-01-11 DIAGNOSIS — D469 Myelodysplastic syndrome, unspecified: Secondary | ICD-10-CM

## 2023-01-12 ENCOUNTER — Other Ambulatory Visit: Payer: Self-pay

## 2023-01-12 ENCOUNTER — Inpatient Hospital Stay: Payer: Medicare Other

## 2023-01-12 DIAGNOSIS — K5900 Constipation, unspecified: Secondary | ICD-10-CM | POA: Diagnosis not present

## 2023-01-12 DIAGNOSIS — D469 Myelodysplastic syndrome, unspecified: Secondary | ICD-10-CM

## 2023-01-12 DIAGNOSIS — Z79899 Other long term (current) drug therapy: Secondary | ICD-10-CM | POA: Diagnosis not present

## 2023-01-12 DIAGNOSIS — Z7982 Long term (current) use of aspirin: Secondary | ICD-10-CM | POA: Diagnosis not present

## 2023-01-12 DIAGNOSIS — D75839 Thrombocytosis, unspecified: Secondary | ICD-10-CM | POA: Diagnosis not present

## 2023-01-12 DIAGNOSIS — M25472 Effusion, left ankle: Secondary | ICD-10-CM | POA: Diagnosis not present

## 2023-01-12 DIAGNOSIS — M1711 Unilateral primary osteoarthritis, right knee: Secondary | ICD-10-CM | POA: Diagnosis not present

## 2023-01-12 DIAGNOSIS — R161 Splenomegaly, not elsewhere classified: Secondary | ICD-10-CM | POA: Diagnosis not present

## 2023-01-12 DIAGNOSIS — J90 Pleural effusion, not elsewhere classified: Secondary | ICD-10-CM | POA: Diagnosis not present

## 2023-01-12 DIAGNOSIS — M25471 Effusion, right ankle: Secondary | ICD-10-CM | POA: Diagnosis not present

## 2023-01-12 DIAGNOSIS — K219 Gastro-esophageal reflux disease without esophagitis: Secondary | ICD-10-CM | POA: Diagnosis not present

## 2023-01-12 DIAGNOSIS — K449 Diaphragmatic hernia without obstruction or gangrene: Secondary | ICD-10-CM | POA: Diagnosis not present

## 2023-01-12 DIAGNOSIS — Z87891 Personal history of nicotine dependence: Secondary | ICD-10-CM | POA: Diagnosis not present

## 2023-01-12 LAB — CBC WITH DIFFERENTIAL (CANCER CENTER ONLY)
Abs Immature Granulocytes: 0.14 10*3/uL — ABNORMAL HIGH (ref 0.00–0.07)
Basophils Absolute: 0.1 10*3/uL (ref 0.0–0.1)
Basophils Relative: 2 %
Eosinophils Absolute: 0.2 10*3/uL (ref 0.0–0.5)
Eosinophils Relative: 6 %
HCT: 19.7 % — ABNORMAL LOW (ref 39.0–52.0)
Hemoglobin: 6.7 g/dL — CL (ref 13.0–17.0)
Immature Granulocytes: 4 %
Lymphocytes Relative: 9 %
Lymphs Abs: 0.3 10*3/uL — ABNORMAL LOW (ref 0.7–4.0)
MCH: 29.1 pg (ref 26.0–34.0)
MCHC: 34 g/dL (ref 30.0–36.0)
MCV: 85.7 fL (ref 80.0–100.0)
Monocytes Absolute: 0.3 10*3/uL (ref 0.1–1.0)
Monocytes Relative: 8 %
Neutro Abs: 2.4 10*3/uL (ref 1.7–7.7)
Neutrophils Relative %: 71 %
Platelet Count: 339 10*3/uL (ref 150–400)
RBC: 2.3 MIL/uL — ABNORMAL LOW (ref 4.22–5.81)
RDW: 14.3 % (ref 11.5–15.5)
WBC Count: 3.3 10*3/uL — ABNORMAL LOW (ref 4.0–10.5)
nRBC: 0 % (ref 0.0–0.2)

## 2023-01-12 LAB — CMP (CANCER CENTER ONLY)
ALT: 11 U/L (ref 0–44)
AST: 8 U/L — ABNORMAL LOW (ref 15–41)
Albumin: 3.9 g/dL (ref 3.5–5.0)
Alkaline Phosphatase: 74 U/L (ref 38–126)
Anion gap: 5 (ref 5–15)
BUN: 18 mg/dL (ref 8–23)
CO2: 27 mmol/L (ref 22–32)
Calcium: 8.4 mg/dL — ABNORMAL LOW (ref 8.9–10.3)
Chloride: 105 mmol/L (ref 98–111)
Creatinine: 0.88 mg/dL (ref 0.61–1.24)
GFR, Estimated: >60 mL/min (ref >60)
Glucose, Bld: 134 mg/dL — ABNORMAL HIGH (ref 70–99)
Potassium: 4.3 mmol/L (ref 3.5–5.1)
Sodium: 137 mmol/L (ref 135–145)
Total Bilirubin: 0.7 mg/dL (ref 0.3–1.2)
Total Protein: 5.9 g/dL — ABNORMAL LOW (ref 6.5–8.1)

## 2023-01-12 LAB — PREPARE RBC (CROSSMATCH)

## 2023-01-12 LAB — SAMPLE TO BLOOD BANK

## 2023-01-12 MED ORDER — ACETAMINOPHEN 325 MG PO TABS
650.0000 mg | ORAL_TABLET | Freq: Once | ORAL | Status: AC
  Administered 2023-01-12: 650 mg via ORAL
  Filled 2023-01-12: qty 2

## 2023-01-12 MED ORDER — SODIUM CHLORIDE 0.9% IV SOLUTION
250.0000 mL | Freq: Once | INTRAVENOUS | Status: AC
  Administered 2023-01-12: 250 mL via INTRAVENOUS

## 2023-01-12 MED ORDER — METHYLPREDNISOLONE SODIUM SUCC 40 MG IJ SOLR
40.0000 mg | Freq: Once | INTRAMUSCULAR | Status: AC
  Administered 2023-01-12: 40 mg via INTRAVENOUS
  Filled 2023-01-12: qty 1

## 2023-01-12 MED ORDER — SODIUM CHLORIDE 0.9% FLUSH
10.0000 mL | Freq: Once | INTRAVENOUS | Status: AC
  Administered 2023-01-12: 10 mL

## 2023-01-13 ENCOUNTER — Encounter: Payer: Self-pay | Admitting: Hematology

## 2023-01-13 ENCOUNTER — Inpatient Hospital Stay: Payer: Medicare Other

## 2023-01-13 DIAGNOSIS — D75839 Thrombocytosis, unspecified: Secondary | ICD-10-CM | POA: Diagnosis not present

## 2023-01-13 DIAGNOSIS — K5900 Constipation, unspecified: Secondary | ICD-10-CM | POA: Diagnosis not present

## 2023-01-13 DIAGNOSIS — M25471 Effusion, right ankle: Secondary | ICD-10-CM | POA: Diagnosis not present

## 2023-01-13 DIAGNOSIS — Z7982 Long term (current) use of aspirin: Secondary | ICD-10-CM | POA: Diagnosis not present

## 2023-01-13 DIAGNOSIS — R161 Splenomegaly, not elsewhere classified: Secondary | ICD-10-CM | POA: Diagnosis not present

## 2023-01-13 DIAGNOSIS — K219 Gastro-esophageal reflux disease without esophagitis: Secondary | ICD-10-CM | POA: Diagnosis not present

## 2023-01-13 DIAGNOSIS — J90 Pleural effusion, not elsewhere classified: Secondary | ICD-10-CM | POA: Diagnosis not present

## 2023-01-13 DIAGNOSIS — Z79899 Other long term (current) drug therapy: Secondary | ICD-10-CM | POA: Diagnosis not present

## 2023-01-13 DIAGNOSIS — M25472 Effusion, left ankle: Secondary | ICD-10-CM | POA: Diagnosis not present

## 2023-01-13 DIAGNOSIS — K449 Diaphragmatic hernia without obstruction or gangrene: Secondary | ICD-10-CM | POA: Diagnosis not present

## 2023-01-13 DIAGNOSIS — D469 Myelodysplastic syndrome, unspecified: Secondary | ICD-10-CM

## 2023-01-13 DIAGNOSIS — M1711 Unilateral primary osteoarthritis, right knee: Secondary | ICD-10-CM | POA: Diagnosis not present

## 2023-01-13 DIAGNOSIS — Z87891 Personal history of nicotine dependence: Secondary | ICD-10-CM | POA: Diagnosis not present

## 2023-01-13 LAB — PREPARE RBC (CROSSMATCH)

## 2023-01-13 MED ORDER — METHYLPREDNISOLONE SODIUM SUCC 40 MG IJ SOLR
40.0000 mg | Freq: Once | INTRAMUSCULAR | Status: AC
  Administered 2023-01-13: 40 mg via INTRAVENOUS
  Filled 2023-01-13: qty 1

## 2023-01-13 MED ORDER — SODIUM CHLORIDE 0.9% IV SOLUTION
250.0000 mL | Freq: Once | INTRAVENOUS | Status: AC
  Administered 2023-01-13: 250 mL via INTRAVENOUS

## 2023-01-13 MED ORDER — ACETAMINOPHEN 325 MG PO TABS
650.0000 mg | ORAL_TABLET | Freq: Once | ORAL | Status: AC
  Administered 2023-01-13: 650 mg via ORAL
  Filled 2023-01-13: qty 2

## 2023-01-13 MED ORDER — HEPARIN SOD (PORK) LOCK FLUSH 100 UNIT/ML IV SOLN
500.0000 [IU] | Freq: Every day | INTRAVENOUS | Status: AC | PRN
  Administered 2023-01-13: 500 [IU]

## 2023-01-13 MED ORDER — SODIUM CHLORIDE 0.9% FLUSH
10.0000 mL | INTRAVENOUS | Status: AC | PRN
  Administered 2023-01-13: 10 mL

## 2023-01-13 NOTE — Patient Instructions (Signed)

## 2023-01-13 NOTE — Progress Notes (Signed)
This encounter was created in error - please disregard.

## 2023-01-14 LAB — TYPE AND SCREEN
ABO/RH(D): A POS
Antibody Screen: NEGATIVE
Unit division: 0
Unit division: 0

## 2023-01-14 LAB — BPAM RBC
Blood Product Expiration Date: 202404202359
Blood Product Expiration Date: 202404202359
ISSUE DATE / TIME: 202403271339
ISSUE DATE / TIME: 202403280940
Unit Type and Rh: 6200
Unit Type and Rh: 6200

## 2023-01-18 ENCOUNTER — Other Ambulatory Visit: Payer: Self-pay

## 2023-01-20 ENCOUNTER — Inpatient Hospital Stay: Payer: Medicare Other

## 2023-01-20 ENCOUNTER — Other Ambulatory Visit: Payer: Self-pay

## 2023-01-20 ENCOUNTER — Inpatient Hospital Stay: Payer: Medicare Other | Attending: Hematology

## 2023-01-20 VITALS — BP 101/64 | HR 72 | Temp 98.0°F | Resp 16

## 2023-01-20 DIAGNOSIS — D649 Anemia, unspecified: Secondary | ICD-10-CM

## 2023-01-20 DIAGNOSIS — Z87442 Personal history of urinary calculi: Secondary | ICD-10-CM | POA: Insufficient documentation

## 2023-01-20 DIAGNOSIS — D75839 Thrombocytosis, unspecified: Secondary | ICD-10-CM | POA: Diagnosis not present

## 2023-01-20 DIAGNOSIS — E785 Hyperlipidemia, unspecified: Secondary | ICD-10-CM | POA: Insufficient documentation

## 2023-01-20 DIAGNOSIS — Z5112 Encounter for antineoplastic immunotherapy: Secondary | ICD-10-CM | POA: Diagnosis not present

## 2023-01-20 DIAGNOSIS — K219 Gastro-esophageal reflux disease without esophagitis: Secondary | ICD-10-CM | POA: Diagnosis not present

## 2023-01-20 DIAGNOSIS — D469 Myelodysplastic syndrome, unspecified: Secondary | ICD-10-CM

## 2023-01-20 DIAGNOSIS — Z79899 Other long term (current) drug therapy: Secondary | ICD-10-CM | POA: Insufficient documentation

## 2023-01-20 DIAGNOSIS — Z86010 Personal history of colonic polyps: Secondary | ICD-10-CM | POA: Insufficient documentation

## 2023-01-20 DIAGNOSIS — D464 Refractory anemia, unspecified: Secondary | ICD-10-CM | POA: Insufficient documentation

## 2023-01-20 DIAGNOSIS — Z87891 Personal history of nicotine dependence: Secondary | ICD-10-CM | POA: Insufficient documentation

## 2023-01-20 DIAGNOSIS — M129 Arthropathy, unspecified: Secondary | ICD-10-CM | POA: Insufficient documentation

## 2023-01-20 DIAGNOSIS — D72828 Other elevated white blood cell count: Secondary | ICD-10-CM | POA: Diagnosis not present

## 2023-01-20 LAB — CBC WITH DIFFERENTIAL (CANCER CENTER ONLY)
Abs Immature Granulocytes: 0.31 10*3/uL — ABNORMAL HIGH (ref 0.00–0.07)
Basophils Absolute: 0.3 10*3/uL — ABNORMAL HIGH (ref 0.0–0.1)
Basophils Relative: 7 %
Eosinophils Absolute: 0.3 10*3/uL (ref 0.0–0.5)
Eosinophils Relative: 6 %
HCT: 20.7 % — ABNORMAL LOW (ref 39.0–52.0)
Hemoglobin: 6.8 g/dL — CL (ref 13.0–17.0)
Immature Granulocytes: 7 %
Lymphocytes Relative: 10 %
Lymphs Abs: 0.4 10*3/uL — ABNORMAL LOW (ref 0.7–4.0)
MCH: 28.6 pg (ref 26.0–34.0)
MCHC: 32.9 g/dL (ref 30.0–36.0)
MCV: 87 fL (ref 80.0–100.0)
Monocytes Absolute: 0.3 10*3/uL (ref 0.1–1.0)
Monocytes Relative: 8 %
Neutro Abs: 2.8 10*3/uL (ref 1.7–7.7)
Neutrophils Relative %: 62 %
Platelet Count: 620 10*3/uL — ABNORMAL HIGH (ref 150–400)
RBC: 2.38 MIL/uL — ABNORMAL LOW (ref 4.22–5.81)
RDW: 14.7 % (ref 11.5–15.5)
WBC Count: 4.4 10*3/uL (ref 4.0–10.5)
nRBC: 0 % (ref 0.0–0.2)

## 2023-01-20 LAB — PREPARE RBC (CROSSMATCH)

## 2023-01-20 MED ORDER — SODIUM CHLORIDE 0.9% IV SOLUTION
250.0000 mL | Freq: Once | INTRAVENOUS | Status: AC
  Administered 2023-01-20: 250 mL via INTRAVENOUS

## 2023-01-20 MED ORDER — HEPARIN SOD (PORK) LOCK FLUSH 100 UNIT/ML IV SOLN
500.0000 [IU] | Freq: Once | INTRAVENOUS | Status: AC
  Administered 2023-01-20: 500 [IU]

## 2023-01-20 MED ORDER — SODIUM CHLORIDE 0.9% FLUSH
10.0000 mL | Freq: Once | INTRAVENOUS | Status: AC
  Administered 2023-01-20: 10 mL

## 2023-01-20 MED ORDER — METHYLPREDNISOLONE SODIUM SUCC 40 MG IJ SOLR
40.0000 mg | Freq: Once | INTRAMUSCULAR | Status: AC
  Administered 2023-01-20: 40 mg via INTRAVENOUS
  Filled 2023-01-20: qty 1

## 2023-01-20 MED ORDER — SODIUM CHLORIDE 0.9% FLUSH: 3.0000 mL | INTRAVENOUS | Status: DC | PRN

## 2023-01-20 MED ORDER — ACETAMINOPHEN 325 MG PO TABS
650.0000 mg | ORAL_TABLET | Freq: Once | ORAL | Status: AC
  Administered 2023-01-20: 650 mg via ORAL
  Filled 2023-01-20: qty 2

## 2023-01-21 ENCOUNTER — Other Ambulatory Visit: Payer: Self-pay

## 2023-01-21 DIAGNOSIS — D469 Myelodysplastic syndrome, unspecified: Secondary | ICD-10-CM

## 2023-01-24 ENCOUNTER — Other Ambulatory Visit: Payer: Self-pay

## 2023-01-24 ENCOUNTER — Inpatient Hospital Stay: Payer: Medicare Other

## 2023-01-24 ENCOUNTER — Inpatient Hospital Stay (HOSPITAL_BASED_OUTPATIENT_CLINIC_OR_DEPARTMENT_OTHER): Payer: Medicare Other | Admitting: Hematology

## 2023-01-24 VITALS — BP 107/58 | HR 66 | Temp 97.9°F | Resp 18 | Wt 163.2 lb

## 2023-01-24 VITALS — BP 97/57 | HR 65 | Resp 16

## 2023-01-24 DIAGNOSIS — D464 Refractory anemia, unspecified: Secondary | ICD-10-CM | POA: Diagnosis not present

## 2023-01-24 DIAGNOSIS — Z87442 Personal history of urinary calculi: Secondary | ICD-10-CM | POA: Diagnosis not present

## 2023-01-24 DIAGNOSIS — D469 Myelodysplastic syndrome, unspecified: Secondary | ICD-10-CM

## 2023-01-24 DIAGNOSIS — Z79899 Other long term (current) drug therapy: Secondary | ICD-10-CM | POA: Diagnosis not present

## 2023-01-24 DIAGNOSIS — D75839 Thrombocytosis, unspecified: Secondary | ICD-10-CM | POA: Diagnosis not present

## 2023-01-24 DIAGNOSIS — Z87891 Personal history of nicotine dependence: Secondary | ICD-10-CM | POA: Diagnosis not present

## 2023-01-24 DIAGNOSIS — Z5112 Encounter for antineoplastic immunotherapy: Secondary | ICD-10-CM | POA: Diagnosis not present

## 2023-01-24 DIAGNOSIS — E785 Hyperlipidemia, unspecified: Secondary | ICD-10-CM | POA: Diagnosis not present

## 2023-01-24 DIAGNOSIS — Z86010 Personal history of colonic polyps: Secondary | ICD-10-CM | POA: Diagnosis not present

## 2023-01-24 DIAGNOSIS — M129 Arthropathy, unspecified: Secondary | ICD-10-CM | POA: Diagnosis not present

## 2023-01-24 DIAGNOSIS — D649 Anemia, unspecified: Secondary | ICD-10-CM

## 2023-01-24 DIAGNOSIS — K219 Gastro-esophageal reflux disease without esophagitis: Secondary | ICD-10-CM | POA: Diagnosis not present

## 2023-01-24 DIAGNOSIS — D72828 Other elevated white blood cell count: Secondary | ICD-10-CM | POA: Diagnosis not present

## 2023-01-24 LAB — CBC WITH DIFFERENTIAL (CANCER CENTER ONLY)
Abs Immature Granulocytes: 0.33 10*3/uL — ABNORMAL HIGH (ref 0.00–0.07)
Basophils Absolute: 0.3 10*3/uL — ABNORMAL HIGH (ref 0.0–0.1)
Basophils Relative: 6 %
Eosinophils Absolute: 0.5 10*3/uL (ref 0.0–0.5)
Eosinophils Relative: 11 %
HCT: 21.1 % — ABNORMAL LOW (ref 39.0–52.0)
Hemoglobin: 7 g/dL — ABNORMAL LOW (ref 13.0–17.0)
Immature Granulocytes: 7 %
Lymphocytes Relative: 10 %
Lymphs Abs: 0.5 10*3/uL — ABNORMAL LOW (ref 0.7–4.0)
MCH: 27.9 pg (ref 26.0–34.0)
MCHC: 33.2 g/dL (ref 30.0–36.0)
MCV: 84.1 fL (ref 80.0–100.0)
Monocytes Absolute: 0.4 10*3/uL (ref 0.1–1.0)
Monocytes Relative: 8 %
Neutro Abs: 2.8 10*3/uL (ref 1.7–7.7)
Neutrophils Relative %: 58 %
Platelet Count: 550 10*3/uL — ABNORMAL HIGH (ref 150–400)
RBC: 2.51 MIL/uL — ABNORMAL LOW (ref 4.22–5.81)
RDW: 15 % (ref 11.5–15.5)
WBC Count: 4.8 10*3/uL (ref 4.0–10.5)
nRBC: 0 % (ref 0.0–0.2)

## 2023-01-24 LAB — CMP (CANCER CENTER ONLY)
ALT: 25 U/L (ref 0–44)
AST: 13 U/L — ABNORMAL LOW (ref 15–41)
Albumin: 3.7 g/dL (ref 3.5–5.0)
Alkaline Phosphatase: 69 U/L (ref 38–126)
Anion gap: 4 — ABNORMAL LOW (ref 5–15)
BUN: 18 mg/dL (ref 8–23)
CO2: 27 mmol/L (ref 22–32)
Calcium: 8.4 mg/dL — ABNORMAL LOW (ref 8.9–10.3)
Chloride: 106 mmol/L (ref 98–111)
Creatinine: 0.9 mg/dL (ref 0.61–1.24)
GFR, Estimated: >60 mL/min (ref >60)
Glucose, Bld: 116 mg/dL — ABNORMAL HIGH (ref 70–99)
Potassium: 4.5 mmol/L (ref 3.5–5.1)
Sodium: 137 mmol/L (ref 135–145)
Total Bilirubin: 0.6 mg/dL (ref 0.3–1.2)
Total Protein: 5.6 g/dL — ABNORMAL LOW (ref 6.5–8.1)

## 2023-01-24 LAB — SAMPLE TO BLOOD BANK

## 2023-01-24 LAB — BPAM RBC
Blood Product Expiration Date: 202404252359
Blood Product Expiration Date: 202404252359
Blood Product Expiration Date: 202404302359
ISSUE DATE / TIME: 202404041028
ISSUE DATE / TIME: 202404051053
Unit Type and Rh: 6200
Unit Type and Rh: 6200
Unit Type and Rh: 6200

## 2023-01-24 LAB — TYPE AND SCREEN
ABO/RH(D): A POS
Antibody Screen: NEGATIVE
Unit division: 0
Unit division: 0

## 2023-01-24 LAB — PREPARE RBC (CROSSMATCH)

## 2023-01-24 MED ORDER — SODIUM CHLORIDE 0.9% FLUSH
10.0000 mL | INTRAVENOUS | Status: DC | PRN
  Administered 2023-01-24: 10 mL

## 2023-01-24 MED ORDER — SODIUM CHLORIDE 0.9 % IV SOLN
75.0000 mg/m2 | Freq: Once | INTRAVENOUS | Status: AC
  Administered 2023-01-24: 141 mg via INTRAVENOUS
  Filled 2023-01-24: qty 14.1

## 2023-01-24 MED ORDER — HEPARIN SOD (PORK) LOCK FLUSH 100 UNIT/ML IV SOLN
500.0000 [IU] | Freq: Once | INTRAVENOUS | Status: AC | PRN
  Administered 2023-01-24: 500 [IU]

## 2023-01-24 MED ORDER — SODIUM CHLORIDE 0.9% FLUSH
10.0000 mL | Freq: Once | INTRAVENOUS | Status: AC
  Administered 2023-01-24: 10 mL

## 2023-01-24 MED ORDER — SODIUM CHLORIDE 0.9 % IV SOLN: Freq: Once | INTRAVENOUS | Status: AC

## 2023-01-24 NOTE — Progress Notes (Signed)
HEMATOLOGY/ONCOLOGY PROGRESS NOTE:   Date of Service: 01/24/23  Patient Care Team: Jackelyn Poling, DO as PCP - General (Family Medicine)  CHIEF COMPLAINTS:  Follow-up for continued evaluation and management of JAK2 positive myeloproliferative neoplasm/MDS  INTERVAL HISTORY:  [name] is a 77 y.o. male here for continued evaluation and management of his MPN/MDS with refractory anemia and thrombocytosis. He is here to start cycle 6 of Vidaza.  Patient was last seen by me on 12/27/2022 and he complained of headache, body weakness, ad mild bilateral leg swelling.    Patient is accompanied by his wife during this visit. He notes he has been doing well overall since our last visit. He does complain of feeling full due to enlarged spleen and discomfort near his spleen area. Patient reports he has more energy since our last visit and he has been staying physically active by walking around 1.3 miles.   He denies fever, chills, night sweats, infection issues, insomnia, shortness of breath, back pain, chest pian, abdominal pain, abnormal bowel movement, or abnormal bleeding. He does complain of chronic left ankle swelling, which improves when he wears compression socks.   Patient tolerated his previous cycle of Vidaza well without any new or severe toxicities. He denies skin rashes or other toxicities.    MEDICAL HISTORY:  Past Medical History:  Diagnosis Date   Arthritis of right knee    Cervicalgia    Chest discomfort    normal stress echo   Chest pain    Colon polyps    Dyslipidemia    Fluttering sensation of heart    GERD without esophagitis    Hematochezia    Hyperglycemia    Hyperlipidemia    Internal hemorrhoids    Kidney stones    Male erectile dysfunction, unspecified    Mixed dyslipidemia    Mixed hyperlipidemia    Ocular migraine    PAC (premature atrial contraction)    Personal history of colonic polyps    Prediabetes    Residual hemorrhoidal skin tags     Spleen enlarged    nov 2023 hospitalized   Unspecified hemorrhoids     SURGICAL HISTORY: Past Surgical History:  Procedure Laterality Date   BONE MARROW BIOPSY     IR IMAGING GUIDED PORT INSERTION  09/14/2022   KIDNEY STONE SURGERY     retrieval    SOCIAL HISTORY: Social History   Socioeconomic History   Marital status: Married    Spouse name: Not on file   Number of children: Not on file   Years of education: Not on file   Highest education level: Not on file  Occupational History   Not on file  Tobacco Use   Smoking status: Former    Types: Cigarettes   Smokeless tobacco: Never  Substance and Sexual Activity   Alcohol use: Yes    Alcohol/week: 1.0 standard drink of alcohol    Types: 1 Cans of beer per week   Drug use: Not on file   Sexual activity: Not on file  Other Topics Concern   Not on file  Social History Narrative   Not on file   Social Determinants of Health   Financial Resource Strain: Not on file  Food Insecurity: Unknown (08/31/2022)   Hunger Vital Sign    Worried About Running Out of Food in the Last Year: Never true    Ran Out of Food in the Last Year: Not on file  Transportation Needs: No Transportation Needs (08/31/2022)  PRAPARE - Administrator, Civil Service (Medical): No    Lack of Transportation (Non-Medical): No  Physical Activity: Not on file  Stress: Not on file  Social Connections: Not on file  Intimate Partner Violence: Not on file    FAMILY HISTORY: Family History  Problem Relation Age of Onset   Stroke Brother    Congestive Heart Failure Brother     ALLERGIES:  has No Known Allergies.  MEDICATIONS:  Current Outpatient Medications  Medication Sig Dispense Refill   ALFUZOSIN HCL ER PO Take 1 tablet by mouth daily after supper.     aspirin EC 81 MG tablet Take 81 mg by mouth daily. Swallow whole.     B Complex Vitamins (B COMPLEX PO) Take 1 tablet by mouth daily.     Cholecalciferol (VITAMIN D3) 50 MCG (2000  UT) TABS Take 2,000 Units by mouth daily.     docusate sodium (COLACE) 100 MG capsule Take 100 mg by mouth 2 (two) times daily.     FIBER PO Take 1 capsule by mouth daily. Unknown strength     fish oil-omega-3 fatty acids 1000 MG capsule Take 1 g by mouth 2 (two) times daily. Strength 300mg /1500mg      Flaxseed, Linseed, (FLAXSEED OIL) 1000 MG CAPS Take 1,000 mg by mouth daily.     GLUCOSAMINE CHONDROITIN MSM PO Take 1 tablet by mouth daily. Strength 1500/1500     lidocaine-prilocaine (EMLA) cream Apply to affected area once (Patient taking differently: Apply 1 Application topically once. Apply to affected area once) 30 g 3   Misc Natural Products (PROSTATE SUPPORT PO) Take 1 capsule by mouth daily. Unknown strength     Multiple Vitamin (MULTIVITAMIN) tablet Take 1 tablet by mouth daily. Unknown strength     ondansetron (ZOFRAN) 8 MG tablet Take 1 tablet (8 mg total) by mouth every 8 (eight) hours as needed for nausea or vomiting. 30 tablet 1   prochlorperazine (COMPAZINE) 10 MG tablet Take 1 tablet (10 mg total) by mouth every 6 (six) hours as needed for nausea or vomiting. 30 tablet 1   psyllium (METAMUCIL) 58.6 % powder Take 1 packet by mouth daily.     senna-docusate (SENNA S) 8.6-50 MG tablet Take 2 tablets by mouth at bedtime. 60 tablet 1   traMADol (ULTRAM) 50 MG tablet Take 50 mg by mouth as needed for moderate pain or severe pain.     Turmeric (QC TUMERIC COMPLEX PO) Take 750 mg by mouth daily.     vitamin C (ASCORBIC ACID) 250 MG tablet Take 500 mg by mouth daily.     No current facility-administered medications for this visit.    REVIEW OF SYSTEMS:   10 Point review of Systems was done is negative except as noted above.   PHYSICAL EXAMINATION: .There were no vitals taken for this visit. NAD GENERAL:alert, in no acute distress and comfortable SKIN: no acute rashes, no significant lesions EYES: conjunctiva are pink and non-injected, sclera anicteric NECK: supple, no JVD LYMPH:   no palpable lymphadenopathy in the cervical, axillary or inguinal regions LUNGS: clear to auscultation b/l with normal respiratory effort HEART: regular rate & rhythm ABDOMEN:  normoactive bowel sounds , non tender, not distended. Splenomegaly  Extremity: no pedal edema PSYCH: alert & oriented x 3 with fluent speech NEURO: no focal motor/sensory deficits  LABORATORY DATA:  I have reviewed the data as listed .    Latest Ref Rng & Units 01/20/2023    8:45 AM 01/12/2023  12:20 PM 01/06/2023    8:55 AM  CBC  WBC 4.0 - 10.5 K/uL 4.4  3.3  3.1   Hemoglobin 13.0 - 17.0 g/dL 6.8  6.7  6.0   Hematocrit 39.0 - 52.0 % 20.7  19.7  18.6   Platelets 150 - 400 K/uL 620  339  191      Iron/TIBC/Ferritin/ %Sat    Component Value Date/Time   IRON 180 09/30/2022 0924   TIBC 193 (L) 09/30/2022 0924   FERRITIN 1,450 (H) 09/30/2022 0924   IRONPCTSAT 93 (H) 09/30/2022 0924  .[name] Kitchen    Latest Ref Rng & Units 01/12/2023   12:20 PM 12/27/2022    9:01 AM 11/29/2022   11:53 AM  CMP  Glucose 70 - 99 mg/dL 086  578  98   BUN 8 - 23 mg/dL 18  22  19    Creatinine 0.61 - 1.24 mg/dL 4.69  6.29  5.28   Sodium 135 - 145 mmol/L 137  137  137   Potassium 3.5 - 5.1 mmol/L 4.3  4.4  4.5   Chloride 98 - 111 mmol/L 105  106  105   CO2 22 - 32 mmol/L 27  28  28    Calcium 8.9 - 10.3 mg/dL 8.4  8.5  9.0   Total Protein 6.5 - 8.1 g/dL 5.9  5.6  5.8   Total Bilirubin 0.3 - 1.2 mg/dL 0.7  0.6  0.6   Alkaline Phos 38 - 126 U/L 74  76  100   AST 15 - 41 U/L 8  12  12    ALT 0 - 44 U/L 11  20  20     . Lab Results  Component Value Date   LDH 110 01/04/2022    02/23/2021 BCR ABL    02/23/2021 JAK2   Surgical Pathology  CASE: [id number]  PATIENT: Glyn Ziller  Bone Marrow Report  Clinical History: MPN with progressive anemia  (BH)  DIAGNOSIS:   BONE MARROW, ASPIRATE, CLOT, CORE:  -Hypercellular bone marrow with features of myeloid neoplasm  -See comment   PERIPHERAL BLOOD:  -Macrocytic anemia   -Leukocytosis  -Thrombocytosis   COMMENT:   The bone marrow/peripheral blood show persistent involvement by  previously known myeloid neoplasm.  There is a myeloproliferative  component as supported by previous JAK2 positivity.  However, there are  also dyspoietic changes primarily involving the megakaryocytic cell line  with numerous hypolobated/unilobated forms in addition to  dysgranulopoiesis to a lesser extent.  This is associated with  eosinophilia.  It is not entirely clear whether the overall findings  represent a myeloproliferative neoplasm with treatment related changes  or represent a primary myeloproliferative/myelodysplastic neoplasm  including but not limited to myeloid neoplasms with eosinophilia.  Correlation with cytogenetic and FISH studies strongly recommended.      RADIOGRAPHIC STUDIES: I have personally reviewed the radiological images as listed and agreed with the findings in the report. No results found.   IR IMAGING GUIDED PORT INSERTION  Result Date: 09/14/2022 INDICATION: Poor IV access.  Myelodysplastic syndrome EXAM: IMPLANTED PORT A CATH PLACEMENT WITH ULTRASOUND AND FLUOROSCOPIC GUIDANCE MEDICATIONS: None ANESTHESIA/SEDATION: Moderate (conscious) sedation was employed during this procedure. A total of Versed 3 mg and Fentanyl 100 mcg was administered intravenously. Moderate Sedation Time: 20 minutes. The patient's level of consciousness and vital signs were monitored continuously by radiology nursing throughout the procedure under my direct supervision. FLUOROSCOPY TIME:  Fluoroscopic dose; 1 mGy COMPLICATIONS: None immediate. PROCEDURE: The procedure, risks, benefits, and alternatives  were explained to the patient. Questions regarding the procedure were encouraged and answered. The patient understands and consents to the procedure. The RIGHT neck and chest were prepped with chlorhexidine in a sterile fashion, and a sterile drape was applied covering the  operative field. Maximum barrier sterile technique with sterile gowns and gloves were used for the procedure. A timeout was performed prior to the initiation of the procedure. Local anesthesia was provided with 1% lidocaine with epinephrine. After creating a small venotomy incision, a micropuncture kit was utilized to access the internal jugular vein under direct, real-time ultrasound guidance. Ultrasound image documentation was performed. The microwire was kinked to measure appropriate catheter length. A subcutaneous port pocket was then created along the upper chest wall utilizing a combination of sharp and blunt dissection. The pocket was irrigated with sterile saline. A single lumen ISP power injectable port was chosen for placement. The 8 Fr catheter was tunneled from the port pocket site to the venotomy incision. The port was placed in the pocket. The external catheter was trimmed to appropriate length. At the venotomy, an 8 Fr peel-away sheath was placed over a guidewire under fluoroscopic guidance. The catheter was then placed through the sheath and the sheath was removed. Final catheter positioning was confirmed and documented with a fluoroscopic spot radiograph. The port was accessed with a Huber needle, aspirated and flushed with heparinized saline. The port pocket incision was closed with interrupted 3-0 Vicryl suture then Dermabond was applied, including at the venotomy incision. Dressings were placed. The patient tolerated the procedure well without immediate post procedural complication. IMPRESSION: Successful placement of a RIGHT internal jugular approach power injectable Port-A-Cath. The tip of the catheter is positioned within the proximal RIGHT atrium. The catheter is ready for immediate use. Roanna BanningJon Mugweru, MD [facility] Electronically Signed   By: Roanna BanningJon  Mugweru M.D.   On: 09/14/2022 17:18   CT ANGIO GI BLEED  Result Date:  08/31/2022 CLINICAL DATA:  Acute mesenteric ischemia. Abdominal pain. History of myelodysplastic syndrome. EXAM: CTA ABDOMEN AND PELVIS WITHOUT AND WITH CONTRAST TECHNIQUE: Multidetector CT imaging of the abdomen and pelvis was performed using the standard protocol during bolus administration of intravenous contrast. Multiplanar reconstructed images and MIPs were obtained and reviewed to evaluate the vascular anatomy. RADIATION DOSE REDUCTION: This exam was performed according to the departmental dose-optimization program which includes automated exposure control, adjustment of the mA and/or kV according to patient size and/or use of iterative reconstruction technique. CONTRAST:  100mL OMNIPAQUE IOHEXOL 350 MG/ML SOLN COMPARISON:  Abdominal ultrasound examination 04/03/2021 FINDINGS: VASCULAR Aorta: Normal caliber abdominal aorta. Scattered atherosclerotic calcifications. No dissection. Celiac: Normal SMA: Normal Renals: Normal IMA: Patent Inflow: Normal Proximal Outflow: Normal Veins: Normal Review of the MIP images confirms the above findings. NON-VASCULAR Lower chest: Small left pleural effusion and bibasilar atelectasis. The heart is normal in size. No pericardial effusion. There is a moderate to large hiatal hernia. Hepatobiliary: No hepatic lesions or intrahepatic biliary dilatation. The gallbladder is unremarkable. No common bile duct dilatation. Pancreas: No mass, inflammation or ductal dilatation. Spleen: Massive splenomegaly. The spleen measures 20 x 16 x 11 cm. No splenic lesions or splenic infarct. Adrenals/Urinary Tract: The left kidney is displaced medially and inferiorly by the enlarged spleen. No worrisome renal lesions,, hydronephrosis or pyelonephritis. There is a lower pole right renal calculus and there are 2 distal left ureteral calculi more proximal calculus measures 4 mm and the more distal calculus measures 5 mm. I do  not see any hydroureter or hydronephrosis. Stomach/Bowel: The stomach,  duodenum, small bowel and colon are grossly normal. No inflammatory changes, mass lesions or obstructive findings. Lymphatic: No abdominal or pelvic lymphadenopathy. Reproductive: The prostate gland is enlarged. The seminal vesicles are unremarkable. Other: No pelvic mass or adenopathy. No free pelvic fluid collections. No inguinal mass or adenopathy. No abdominal wall hernia or subcutaneous lesions. Musculoskeletal: No significant bony findings. IMPRESSION: 1. Normal caliber abdominal aorta and no dissection. The branch vessels are normal. 2. Splenomegaly. 3. Two distal left ureteral calculi but no hydroureter or hydronephrosis. 4. Lower pole right renal calculus. 5. Moderate to large hiatal hernia. 6. Small left pleural effusion and bibasilar atelectasis. Electronically Signed   By: Rudie MeyerP.  Gallerani M.D.   On: 08/31/2022 18:13     ASSESSMENT & PLAN:    #1 JAK2-positive myeloproliferative neoplasm-primarily presenting with thrombocytosis and associated MDS causing refractory anemia -No polycythemia.  -Mild leukocytosis.  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 Has grade 1 out of 3 reticulin fibrosis.  Uncertain if this is primary or secondary. -LDH has remained within normal limits. 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 -CT Angio GI bleed scan from 09/01/2022 showed kidney stones.and significant splenomegaly  PLAN: -Discussed lab results from today, 01/24/2023, with the patient and his wife. CBC showed decreased hemoglobin at 7.0, decreased hematocrit of 21.1, and elevated platelets of 550. CMP is stable. -Continue blood transfusion as needed every Thursday.  -Answered all of patient's questions.  -patient is tolerating his Vidaza treatment well without any new or severe toxicities.  -Patient can proceed with Cycle 6 of Vidaza without any dose modification. Orders reviewde and signed. -Continue monitoring weekly labs and transfuse as needed for  hemoglobin less than 7.5. 1 blood unit weekly as needed.  -Patient wants to hold off bone marrow biopsy until after cycle 8.    Follow-up: Portflush, labs and 1 unit of PRBC every Thursday x 12 Plz schedule C7 and C8 of Vidaza per integrated scheduling   The total time spent in the appointment was 30 minutes* .  All of the patient's questions were answered with apparent satisfaction. The patient knows to call the clinic with any problems, questions or concerns.   Wyvonnia LoraGautam Keyandra Swenson MD MS AAHIVMS [facility] Hematology/Oncology Physician [facility]  .*Total Encounter Time as defined by the Centers for Medicare and Medicaid Services includes, in addition to the face-to-face time of a patient visit (documented in the note above) non-face-to-face time: obtaining and reviewing outside history, ordering and reviewing medications, tests or procedures, care coordination (communications with other health care professionals or caregivers) and documentation in the medical record.   I, Ok EdwardsParam Shah, am acting as a Neurosurgeonscribe for Wyvonnia LoraGautam Rosalynn Sergent, MD. .I have reviewed the above documentation for accuracy and completeness, and I agree with the above. Johney Maine.Gerard Bonus Kishore Sherley Mckenney MD

## 2023-01-24 NOTE — Progress Notes (Signed)
Patient reports he took Zofran at home prior to arrival.

## 2023-01-24 NOTE — Progress Notes (Signed)
Patient seen by Dr. Addison Naegeli are within treatment parameters.  Labs reviewed: and are not all within treatment parameters. DR Candise Che aware HGB 7.0  Per physician team, patient is ready for treatment and there are NO modifications to the treatment plan.

## 2023-01-25 ENCOUNTER — Other Ambulatory Visit: Payer: Self-pay

## 2023-01-25 ENCOUNTER — Inpatient Hospital Stay: Payer: Medicare Other

## 2023-01-25 VITALS — BP 104/54 | HR 68 | Temp 98.1°F | Resp 18

## 2023-01-25 DIAGNOSIS — D469 Myelodysplastic syndrome, unspecified: Secondary | ICD-10-CM

## 2023-01-25 DIAGNOSIS — Z79899 Other long term (current) drug therapy: Secondary | ICD-10-CM | POA: Diagnosis not present

## 2023-01-25 DIAGNOSIS — D649 Anemia, unspecified: Secondary | ICD-10-CM

## 2023-01-25 DIAGNOSIS — D72828 Other elevated white blood cell count: Secondary | ICD-10-CM | POA: Diagnosis not present

## 2023-01-25 DIAGNOSIS — Z87442 Personal history of urinary calculi: Secondary | ICD-10-CM | POA: Diagnosis not present

## 2023-01-25 DIAGNOSIS — Z86010 Personal history of colonic polyps: Secondary | ICD-10-CM | POA: Diagnosis not present

## 2023-01-25 DIAGNOSIS — Z87891 Personal history of nicotine dependence: Secondary | ICD-10-CM | POA: Diagnosis not present

## 2023-01-25 DIAGNOSIS — M129 Arthropathy, unspecified: Secondary | ICD-10-CM | POA: Diagnosis not present

## 2023-01-25 DIAGNOSIS — D464 Refractory anemia, unspecified: Secondary | ICD-10-CM | POA: Diagnosis not present

## 2023-01-25 DIAGNOSIS — K219 Gastro-esophageal reflux disease without esophagitis: Secondary | ICD-10-CM | POA: Diagnosis not present

## 2023-01-25 DIAGNOSIS — Z5112 Encounter for antineoplastic immunotherapy: Secondary | ICD-10-CM | POA: Diagnosis not present

## 2023-01-25 DIAGNOSIS — D75839 Thrombocytosis, unspecified: Secondary | ICD-10-CM | POA: Diagnosis not present

## 2023-01-25 DIAGNOSIS — E785 Hyperlipidemia, unspecified: Secondary | ICD-10-CM | POA: Diagnosis not present

## 2023-01-25 MED ORDER — HEPARIN SOD (PORK) LOCK FLUSH 100 UNIT/ML IV SOLN
500.0000 [IU] | Freq: Once | INTRAVENOUS | Status: AC | PRN
  Administered 2023-01-25: 500 [IU]

## 2023-01-25 MED ORDER — SODIUM CHLORIDE 0.9% FLUSH
10.0000 mL | INTRAVENOUS | Status: DC | PRN
  Administered 2023-01-25: 10 mL

## 2023-01-25 MED ORDER — SODIUM CHLORIDE 0.9 % IV SOLN: Freq: Once | INTRAVENOUS | Status: AC

## 2023-01-25 MED ORDER — SODIUM CHLORIDE 0.9 % IV SOLN
75.0000 mg/m2 | Freq: Once | INTRAVENOUS | Status: AC
  Administered 2023-01-25: 141 mg via INTRAVENOUS
  Filled 2023-01-25: qty 14.1

## 2023-01-25 NOTE — Progress Notes (Signed)
Pt states he took Zofran at home prior to arriving for tx today.

## 2023-01-25 NOTE — Patient Instructions (Signed)
[facility]  Discharge Instructions: Thank you for choosing [facility] to provide your oncology and hematology care.   If you have a lab appointment with the Cancer Center, please go directly to the Cancer Center and check in at the registration area.   Wear comfortable clothing and clothing appropriate for easy access to any Portacath or PICC line.   We strive to give you quality time with your provider. You may need to reschedule your appointment if you arrive late (15 or more minutes).  Arriving late affects you and other patients whose appointments are after yours.  Also, if you miss three or more appointments without notifying the office, you may be dismissed from the clinic at the provider's discretion.      For prescription refill requests, have your pharmacy contact our office and allow 72 hours for refills to be completed.    Today you received the following chemotherapy and/or immunotherapy agents: Vidaza     To help prevent nausea and vomiting after your treatment, we encourage you to take your nausea medication as directed.  BELOW ARE SYMPTOMS THAT SHOULD BE REPORTED IMMEDIATELY: *FEVER GREATER THAN 100.4 F (38 C) OR HIGHER *CHILLS OR SWEATING *NAUSEA AND VOMITING THAT IS NOT CONTROLLED WITH YOUR NAUSEA MEDICATION *UNUSUAL SHORTNESS OF BREATH *UNUSUAL BRUISING OR BLEEDING *URINARY PROBLEMS (pain or burning when urinating, or frequent urination) *BOWEL PROBLEMS (unusual diarrhea, constipation, pain near the anus) TENDERNESS IN MOUTH AND THROAT WITH OR WITHOUT PRESENCE OF ULCERS (sore throat, sores in mouth, or a toothache) UNUSUAL RASH, SWELLING OR PAIN  UNUSUAL VAGINAL DISCHARGE OR ITCHING   Items with * indicate a potential emergency and should be followed up as soon as possible or go to the Emergency Department if any problems should occur.  Please show the CHEMOTHERAPY ALERT CARD or IMMUNOTHERAPY ALERT CARD at check-in  to the Emergency Department and triage nurse.  Should you have questions after your visit or need to cancel or reschedule your appointment, please contact [facility]  Dept: [phone]  and follow the prompts.  Office hours are 8:00 a.m. to 4:30 p.m. Monday - Friday. Please note that voicemails left after 4:00 p.m. may not be returned until the following business day.  We are closed weekends and major holidays. You have access to a nurse at all times for urgent questions. Please call the main number to the clinic Dept: [phone] and follow the prompts.   For any non-urgent questions, you may also contact your provider using MyChart. We now offer e-Visits for anyone 18 and older to request care online for non-urgent symptoms. For details visit [email or web address].   Also download the MyChart app! Go to the app store, search "MyChart", open the app, select [facility], and log in with your MyChart username and password.  

## 2023-01-26 ENCOUNTER — Inpatient Hospital Stay: Payer: Medicare Other

## 2023-01-26 VITALS — BP 96/56 | HR 65 | Temp 97.8°F | Resp 17

## 2023-01-26 DIAGNOSIS — D75839 Thrombocytosis, unspecified: Secondary | ICD-10-CM | POA: Diagnosis not present

## 2023-01-26 DIAGNOSIS — Z86010 Personal history of colonic polyps: Secondary | ICD-10-CM | POA: Diagnosis not present

## 2023-01-26 DIAGNOSIS — Z5112 Encounter for antineoplastic immunotherapy: Secondary | ICD-10-CM | POA: Diagnosis not present

## 2023-01-26 DIAGNOSIS — D72828 Other elevated white blood cell count: Secondary | ICD-10-CM | POA: Diagnosis not present

## 2023-01-26 DIAGNOSIS — Z87442 Personal history of urinary calculi: Secondary | ICD-10-CM | POA: Diagnosis not present

## 2023-01-26 DIAGNOSIS — Z87891 Personal history of nicotine dependence: Secondary | ICD-10-CM | POA: Diagnosis not present

## 2023-01-26 DIAGNOSIS — M129 Arthropathy, unspecified: Secondary | ICD-10-CM | POA: Diagnosis not present

## 2023-01-26 DIAGNOSIS — K219 Gastro-esophageal reflux disease without esophagitis: Secondary | ICD-10-CM | POA: Diagnosis not present

## 2023-01-26 DIAGNOSIS — D469 Myelodysplastic syndrome, unspecified: Secondary | ICD-10-CM

## 2023-01-26 DIAGNOSIS — Z79899 Other long term (current) drug therapy: Secondary | ICD-10-CM | POA: Diagnosis not present

## 2023-01-26 DIAGNOSIS — D649 Anemia, unspecified: Secondary | ICD-10-CM

## 2023-01-26 DIAGNOSIS — D464 Refractory anemia, unspecified: Secondary | ICD-10-CM | POA: Diagnosis not present

## 2023-01-26 DIAGNOSIS — E785 Hyperlipidemia, unspecified: Secondary | ICD-10-CM | POA: Diagnosis not present

## 2023-01-26 MED ORDER — SODIUM CHLORIDE 0.9 % IV SOLN: Freq: Once | INTRAVENOUS | Status: AC

## 2023-01-26 MED ORDER — ONDANSETRON HCL 8 MG PO TABS: 8.0000 mg | ORAL_TABLET | Freq: Once | ORAL | Status: DC

## 2023-01-26 MED ORDER — SODIUM CHLORIDE 0.9 % IV SOLN
75.0000 mg/m2 | Freq: Once | INTRAVENOUS | Status: AC
  Administered 2023-01-26: 141 mg via INTRAVENOUS
  Filled 2023-01-26: qty 14.1

## 2023-01-26 MED ORDER — SODIUM CHLORIDE 0.9% FLUSH
10.0000 mL | INTRAVENOUS | Status: DC | PRN
  Administered 2023-01-26: 10 mL

## 2023-01-26 NOTE — Progress Notes (Signed)
Pt takes pre-med Zofran at home prior to arrival

## 2023-01-27 ENCOUNTER — Telehealth: Payer: Self-pay

## 2023-01-27 ENCOUNTER — Other Ambulatory Visit: Payer: Self-pay

## 2023-01-27 ENCOUNTER — Inpatient Hospital Stay: Payer: Medicare Other

## 2023-01-27 VITALS — BP 91/56 | HR 63 | Temp 98.0°F | Resp 18

## 2023-01-27 DIAGNOSIS — Z5112 Encounter for antineoplastic immunotherapy: Secondary | ICD-10-CM | POA: Diagnosis not present

## 2023-01-27 DIAGNOSIS — D649 Anemia, unspecified: Secondary | ICD-10-CM

## 2023-01-27 DIAGNOSIS — Z87442 Personal history of urinary calculi: Secondary | ICD-10-CM | POA: Diagnosis not present

## 2023-01-27 DIAGNOSIS — D469 Myelodysplastic syndrome, unspecified: Secondary | ICD-10-CM

## 2023-01-27 DIAGNOSIS — D464 Refractory anemia, unspecified: Secondary | ICD-10-CM | POA: Diagnosis not present

## 2023-01-27 DIAGNOSIS — E785 Hyperlipidemia, unspecified: Secondary | ICD-10-CM | POA: Diagnosis not present

## 2023-01-27 DIAGNOSIS — Z87891 Personal history of nicotine dependence: Secondary | ICD-10-CM | POA: Diagnosis not present

## 2023-01-27 DIAGNOSIS — D72828 Other elevated white blood cell count: Secondary | ICD-10-CM | POA: Diagnosis not present

## 2023-01-27 DIAGNOSIS — D75839 Thrombocytosis, unspecified: Secondary | ICD-10-CM | POA: Diagnosis not present

## 2023-01-27 DIAGNOSIS — Z86010 Personal history of colonic polyps: Secondary | ICD-10-CM | POA: Diagnosis not present

## 2023-01-27 DIAGNOSIS — M129 Arthropathy, unspecified: Secondary | ICD-10-CM | POA: Diagnosis not present

## 2023-01-27 DIAGNOSIS — K219 Gastro-esophageal reflux disease without esophagitis: Secondary | ICD-10-CM | POA: Diagnosis not present

## 2023-01-27 DIAGNOSIS — Z79899 Other long term (current) drug therapy: Secondary | ICD-10-CM | POA: Diagnosis not present

## 2023-01-27 LAB — CBC WITH DIFFERENTIAL (CANCER CENTER ONLY)
Abs Immature Granulocytes: 0.1 10*3/uL — ABNORMAL HIGH (ref 0.00–0.07)
Band Neutrophils: 7 %
Basophils Absolute: 0.2 10*3/uL — ABNORMAL HIGH (ref 0.0–0.1)
Basophils Relative: 4 %
Blasts: 1 %
Eosinophils Absolute: 0.8 10*3/uL — ABNORMAL HIGH (ref 0.0–0.5)
Eosinophils Relative: 18 %
HCT: 19.3 % — ABNORMAL LOW (ref 39.0–52.0)
Hemoglobin: 6.3 g/dL — CL (ref 13.0–17.0)
Lymphocytes Relative: 12 %
Lymphs Abs: 0.6 10*3/uL — ABNORMAL LOW (ref 0.7–4.0)
MCH: 27.9 pg (ref 26.0–34.0)
MCHC: 32.6 g/dL (ref 30.0–36.0)
MCV: 85.4 fL (ref 80.0–100.0)
Metamyelocytes Relative: 3 %
Monocytes Absolute: 0.2 10*3/uL (ref 0.1–1.0)
Monocytes Relative: 4 %
Neutro Abs: 2.7 10*3/uL (ref 1.7–7.7)
Neutrophils Relative %: 51 %
Platelet Count: 470 10*3/uL — ABNORMAL HIGH (ref 150–400)
RBC: 2.26 MIL/uL — ABNORMAL LOW (ref 4.22–5.81)
RDW: 14.9 % (ref 11.5–15.5)
WBC Count: 4.7 10*3/uL (ref 4.0–10.5)
nRBC: 0 % (ref 0.0–0.2)

## 2023-01-27 LAB — TYPE AND SCREEN: ABO/RH(D): A POS

## 2023-01-27 LAB — PREPARE RBC (CROSSMATCH)

## 2023-01-27 MED ORDER — SODIUM CHLORIDE 0.9 % IV SOLN
75.0000 mg/m2 | Freq: Once | INTRAVENOUS | Status: AC
  Administered 2023-01-27: 141 mg via INTRAVENOUS
  Filled 2023-01-27: qty 14.1

## 2023-01-27 MED ORDER — ACETAMINOPHEN 325 MG PO TABS
650.0000 mg | ORAL_TABLET | Freq: Once | ORAL | Status: AC
  Administered 2023-01-27: 650 mg via ORAL
  Filled 2023-01-27: qty 2

## 2023-01-27 MED ORDER — SODIUM CHLORIDE 0.9% FLUSH: 10.0000 mL | INTRAVENOUS | Status: DC | PRN

## 2023-01-27 MED ORDER — SODIUM CHLORIDE 0.9 % IV SOLN: Freq: Once | INTRAVENOUS | Status: AC

## 2023-01-27 MED ORDER — HEPARIN SOD (PORK) LOCK FLUSH 100 UNIT/ML IV SOLN: 500.0000 [IU] | Freq: Once | INTRAVENOUS | Status: DC | PRN

## 2023-01-27 MED ORDER — METHYLPREDNISOLONE SODIUM SUCC 40 MG IJ SOLR
40.0000 mg | Freq: Once | INTRAMUSCULAR | Status: AC
  Administered 2023-01-27: 40 mg via INTRAVENOUS
  Filled 2023-01-27: qty 1

## 2023-01-27 NOTE — Progress Notes (Signed)
Pt states he took zofran at home prior to coming to infusion.

## 2023-01-27 NOTE — Patient Instructions (Signed)
[facility]  Discharge Instructions: Thank you for choosing [facility] to provide your oncology and hematology care.   If you have a lab appointment with the Cancer Center, please go directly to the Cancer Center and check in at the registration area.   Wear comfortable clothing and clothing appropriate for easy access to any Portacath or PICC line.   We strive to give you quality time with your provider. You may need to reschedule your appointment if you arrive late (15 or more minutes).  Arriving late affects you and other patients whose appointments are after yours.  Also, if you miss three or more appointments without notifying the office, you may be dismissed from the clinic at the provider's discretion.      For prescription refill requests, have your pharmacy contact our office and allow 72 hours for refills to be completed.    Today you received the following chemotherapy and/or immunotherapy agents vidaza       To help prevent nausea and vomiting after your treatment, we encourage you to take your nausea medication as directed.  BELOW ARE SYMPTOMS THAT SHOULD BE REPORTED IMMEDIATELY: *FEVER GREATER THAN 100.4 F (38 C) OR HIGHER *CHILLS OR SWEATING *NAUSEA AND VOMITING THAT IS NOT CONTROLLED WITH YOUR NAUSEA MEDICATION *UNUSUAL SHORTNESS OF BREATH *UNUSUAL BRUISING OR BLEEDING *URINARY PROBLEMS (pain or burning when urinating, or frequent urination) *BOWEL PROBLEMS (unusual diarrhea, constipation, pain near the anus) TENDERNESS IN MOUTH AND THROAT WITH OR WITHOUT PRESENCE OF ULCERS (sore throat, sores in mouth, or a toothache) UNUSUAL RASH, SWELLING OR PAIN  UNUSUAL VAGINAL DISCHARGE OR ITCHING   Items with * indicate a potential emergency and should be followed up as soon as possible or go to the Emergency Department if any problems should occur.  Please show the CHEMOTHERAPY ALERT CARD or IMMUNOTHERAPY ALERT CARD at  check-in to the Emergency Department and triage nurse.  Should you have questions after your visit or need to cancel or reschedule your appointment, please contact [facility]  Dept: [phone]  and follow the prompts.  Office hours are 8:00 a.m. to 4:30 p.m. Monday - Friday. Please note that voicemails left after 4:00 p.m. may not be returned until the following business day.  We are closed weekends and major holidays. You have access to a nurse at all times for urgent questions. Please call the main number to the clinic Dept: [phone] and follow the prompts.   For any non-urgent questions, you may also contact your provider using MyChart. We now offer e-Visits for anyone 18 and older to request care online for non-urgent symptoms. For details visit [email or web address].   Also download the MyChart app! Go to the app store, search "MyChart", open the app, select [facility], and log in with your MyChart username and password.   

## 2023-01-27 NOTE — Telephone Encounter (Signed)
CRITICAL VALUE STICKER  CRITICAL VALUE:    Hgb  6.3  RECEIVER (on-site recipient of call):   Daneil Dolin, LPN  DATE & TIME NOTIFIED: 01/27/2023    08:41  MESSENGER (representative from lab):  Lauren  MD NOTIFIED: Candise Che  TIME OF NOTIFICATION:  08:44  RESPONSE:

## 2023-01-28 ENCOUNTER — Inpatient Hospital Stay: Payer: Medicare Other

## 2023-01-28 VITALS — BP 95/63 | HR 63 | Temp 97.9°F | Resp 18

## 2023-01-28 DIAGNOSIS — M129 Arthropathy, unspecified: Secondary | ICD-10-CM | POA: Diagnosis not present

## 2023-01-28 DIAGNOSIS — Z86010 Personal history of colonic polyps: Secondary | ICD-10-CM | POA: Diagnosis not present

## 2023-01-28 DIAGNOSIS — D469 Myelodysplastic syndrome, unspecified: Secondary | ICD-10-CM

## 2023-01-28 DIAGNOSIS — Z87442 Personal history of urinary calculi: Secondary | ICD-10-CM | POA: Diagnosis not present

## 2023-01-28 DIAGNOSIS — Z79899 Other long term (current) drug therapy: Secondary | ICD-10-CM | POA: Diagnosis not present

## 2023-01-28 DIAGNOSIS — D72828 Other elevated white blood cell count: Secondary | ICD-10-CM | POA: Diagnosis not present

## 2023-01-28 DIAGNOSIS — Z87891 Personal history of nicotine dependence: Secondary | ICD-10-CM | POA: Diagnosis not present

## 2023-01-28 DIAGNOSIS — K219 Gastro-esophageal reflux disease without esophagitis: Secondary | ICD-10-CM | POA: Diagnosis not present

## 2023-01-28 DIAGNOSIS — Z5112 Encounter for antineoplastic immunotherapy: Secondary | ICD-10-CM | POA: Diagnosis not present

## 2023-01-28 DIAGNOSIS — E785 Hyperlipidemia, unspecified: Secondary | ICD-10-CM | POA: Diagnosis not present

## 2023-01-28 DIAGNOSIS — D649 Anemia, unspecified: Secondary | ICD-10-CM

## 2023-01-28 DIAGNOSIS — D464 Refractory anemia, unspecified: Secondary | ICD-10-CM | POA: Diagnosis not present

## 2023-01-28 DIAGNOSIS — D75839 Thrombocytosis, unspecified: Secondary | ICD-10-CM | POA: Diagnosis not present

## 2023-01-28 LAB — TYPE AND SCREEN
Antibody Screen: NEGATIVE
Antibody Screen: NEGATIVE
Unit division: 0

## 2023-01-28 MED ORDER — SODIUM CHLORIDE 0.9% IV SOLUTION
250.0000 mL | Freq: Once | INTRAVENOUS | Status: AC
  Administered 2023-01-28: 250 mL via INTRAVENOUS

## 2023-01-28 MED ORDER — ACETAMINOPHEN 325 MG PO TABS
650.0000 mg | ORAL_TABLET | Freq: Once | ORAL | Status: AC
  Administered 2023-01-28: 650 mg via ORAL
  Filled 2023-01-28: qty 2

## 2023-01-28 MED ORDER — SODIUM CHLORIDE 0.9 % IV SOLN: Freq: Once | INTRAVENOUS | Status: AC

## 2023-01-28 MED ORDER — METHYLPREDNISOLONE SODIUM SUCC 40 MG IJ SOLR
40.0000 mg | Freq: Once | INTRAMUSCULAR | Status: AC
  Administered 2023-01-28: 40 mg via INTRAVENOUS
  Filled 2023-01-28: qty 1

## 2023-01-28 MED ORDER — HEPARIN SOD (PORK) LOCK FLUSH 100 UNIT/ML IV SOLN
500.0000 [IU] | Freq: Every day | INTRAVENOUS | Status: AC | PRN
  Administered 2023-01-28: 500 [IU]

## 2023-01-28 MED ORDER — SODIUM CHLORIDE 0.9% FLUSH: 10.0000 mL | INTRAVENOUS | Status: DC | PRN

## 2023-01-28 MED ORDER — ONDANSETRON HCL 8 MG PO TABS: 8.0000 mg | ORAL_TABLET | Freq: Once | ORAL | Status: DC

## 2023-01-28 MED ORDER — HEPARIN SOD (PORK) LOCK FLUSH 100 UNIT/ML IV SOLN: 500.0000 [IU] | Freq: Once | INTRAVENOUS | Status: DC | PRN

## 2023-01-28 MED ORDER — SODIUM CHLORIDE 0.9% FLUSH
10.0000 mL | INTRAVENOUS | Status: AC | PRN
  Administered 2023-01-28: 10 mL

## 2023-01-28 MED ORDER — SODIUM CHLORIDE 0.9 % IV SOLN
75.0000 mg/m2 | Freq: Once | INTRAVENOUS | Status: AC
  Administered 2023-01-28: 141 mg via INTRAVENOUS
  Filled 2023-01-28: qty 14.1

## 2023-01-28 NOTE — Patient Instructions (Signed)
[facility]  Discharge Instructions: Thank you for choosing [facility] to provide your oncology and hematology care.   If you have a lab appointment with the Cancer Center, please go directly to the Cancer Center and check in at the registration area.   Wear comfortable clothing and clothing appropriate for easy access to any Portacath or PICC line.   We strive to give you quality time with your provider. You may need to reschedule your appointment if you arrive late (15 or more minutes).  Arriving late affects you and other patients whose appointments are after yours.  Also, if you miss three or more appointments without notifying the office, you may be dismissed from the clinic at the provider's discretion.      For prescription refill requests, have your pharmacy contact our office and allow 72 hours for refills to be completed.    Today you received the following chemotherapy and/or immunotherapy agents: Vidaza.      To help prevent nausea and vomiting after your treatment, we encourage you to take your nausea medication as directed.  BELOW ARE SYMPTOMS THAT SHOULD BE REPORTED IMMEDIATELY: *FEVER GREATER THAN 100.4 F (38 C) OR HIGHER *CHILLS OR SWEATING *NAUSEA AND VOMITING THAT IS NOT CONTROLLED WITH YOUR NAUSEA MEDICATION *UNUSUAL SHORTNESS OF BREATH *UNUSUAL BRUISING OR BLEEDING *URINARY PROBLEMS (pain or burning when urinating, or frequent urination) *BOWEL PROBLEMS (unusual diarrhea, constipation, pain near the anus) TENDERNESS IN MOUTH AND THROAT WITH OR WITHOUT PRESENCE OF ULCERS (sore throat, sores in mouth, or a toothache) UNUSUAL RASH, SWELLING OR PAIN  UNUSUAL VAGINAL DISCHARGE OR ITCHING   Items with * indicate a potential emergency and should be followed up as soon as possible or go to the Emergency Department if any problems should occur.  Please show the CHEMOTHERAPY ALERT CARD or IMMUNOTHERAPY ALERT CARD at  check-in to the Emergency Department and triage nurse.  Should you have questions after your visit or need to cancel or reschedule your appointment, please contact [facility]  Dept: [phone]  and follow the prompts.  Office hours are 8:00 a.m. to 4:30 p.m. Monday - Friday. Please note that voicemails left after 4:00 p.m. may not be returned until the following business day.  We are closed weekends and major holidays. You have access to a nurse at all times for urgent questions. Please call the main number to the clinic Dept: [phone] and follow the prompts.   For any non-urgent questions, you may also contact your provider using MyChart. We now offer e-Visits for anyone 45 and older to request care online for non-urgent symptoms. For details visit [email or web address].   Also download the MyChart app! Go to the app store, search "MyChart", open the app, select [facility], and log in with your MyChart username and password.  Blood Transfusion, Adult, Care After The following information offers guidance on how to care for yourself after your procedure. Your health care provider may also give you more specific instructions. If you have problems or questions, contact your health care provider. What can I expect after the procedure? After the procedure, it is common to have: Bruising and soreness where the IV was inserted. A headache. Follow these instructions at home: IV insertion site care     Follow instructions from your health care provider about how to take care of your IV insertion site. Make sure you: Wash your hands with soap and water  for at least 20 seconds before and after you change your bandage (dressing). If soap and water are not available, use hand sanitizer. Change your dressing as told by your health care provider. Check your IV insertion site every day for signs of infection. Check for: Redness, swelling, or pain. Bleeding from  the site. Warmth. Pus or a bad smell. General instructions Take over-the-counter and prescription medicines only as told by your health care provider. Rest as told by your health care provider. Return to your normal activities as told by your health care provider. Keep all follow-up visits. Lab tests may need to be done at certain periods to recheck your blood counts. Contact a health care provider if: You have itching or red, swollen areas of skin (hives). You have a fever or chills. You have pain in the head, back, or chest. You feel anxious or you feel weak after doing your normal activities. You have redness, swelling, warmth, or pain around the IV insertion site. You have blood coming from the IV insertion site that does not stop with pressure. You have pus or a bad smell coming from your IV insertion site. If you received your blood transfusion in an outpatient setting, you will be told whom to contact to report any reactions. Get help right away if: You have symptoms of a serious allergic or immune system reaction, including: Trouble breathing or shortness of breath. Swelling of the face, feeling flushed, or widespread rash. Dark urine or blood in the urine. Fast heartbeat. These symptoms may be an emergency. Get help right away. Call 911. Do not wait to see if the symptoms will go away. Do not drive yourself to the hospital. Summary Bruising and soreness around the IV insertion site are common. Check your IV insertion site every day for signs of infection. Rest as told by your health care provider. Return to your normal activities as told by your health care provider. Get help right away for symptoms of a serious allergic or immune system reaction to the blood transfusion. This information is not intended to replace advice given to you by your health care provider. Make sure you discuss any questions you have with your health care provider. Document Revised: 01/01/2022  Document Reviewed: 01/01/2022 Elsevier Patient Education  2023 [employer].

## 2023-01-28 NOTE — Progress Notes (Signed)
Pt states he took Zofran at home today prior to arriving for infusion.

## 2023-01-29 LAB — TYPE AND SCREEN
ABO/RH(D): A POS
Unit division: 0

## 2023-01-29 LAB — BPAM RBC
Blood Product Expiration Date: 202405012359
ISSUE DATE / TIME: 202404120908
Unit Type and Rh: 6200

## 2023-01-30 ENCOUNTER — Encounter: Payer: Self-pay | Admitting: Hematology

## 2023-02-02 ENCOUNTER — Other Ambulatory Visit: Payer: Self-pay | Admitting: Hematology

## 2023-02-03 ENCOUNTER — Inpatient Hospital Stay: Payer: Medicare Other

## 2023-02-03 ENCOUNTER — Other Ambulatory Visit: Payer: Self-pay

## 2023-02-03 DIAGNOSIS — D469 Myelodysplastic syndrome, unspecified: Secondary | ICD-10-CM

## 2023-02-03 DIAGNOSIS — D72828 Other elevated white blood cell count: Secondary | ICD-10-CM | POA: Diagnosis not present

## 2023-02-03 DIAGNOSIS — Z5112 Encounter for antineoplastic immunotherapy: Secondary | ICD-10-CM | POA: Diagnosis not present

## 2023-02-03 DIAGNOSIS — Z87442 Personal history of urinary calculi: Secondary | ICD-10-CM | POA: Diagnosis not present

## 2023-02-03 DIAGNOSIS — M129 Arthropathy, unspecified: Secondary | ICD-10-CM | POA: Diagnosis not present

## 2023-02-03 DIAGNOSIS — D464 Refractory anemia, unspecified: Secondary | ICD-10-CM | POA: Diagnosis not present

## 2023-02-03 DIAGNOSIS — E785 Hyperlipidemia, unspecified: Secondary | ICD-10-CM | POA: Diagnosis not present

## 2023-02-03 DIAGNOSIS — Z86010 Personal history of colonic polyps: Secondary | ICD-10-CM | POA: Diagnosis not present

## 2023-02-03 DIAGNOSIS — Z79899 Other long term (current) drug therapy: Secondary | ICD-10-CM | POA: Diagnosis not present

## 2023-02-03 DIAGNOSIS — Z87891 Personal history of nicotine dependence: Secondary | ICD-10-CM | POA: Diagnosis not present

## 2023-02-03 DIAGNOSIS — K219 Gastro-esophageal reflux disease without esophagitis: Secondary | ICD-10-CM | POA: Diagnosis not present

## 2023-02-03 DIAGNOSIS — D75839 Thrombocytosis, unspecified: Secondary | ICD-10-CM | POA: Diagnosis not present

## 2023-02-03 LAB — CBC WITH DIFFERENTIAL (CANCER CENTER ONLY)
Abs Immature Granulocytes: 0.11 10*3/uL — ABNORMAL HIGH (ref 0.00–0.07)
Basophils Absolute: 0.1 10*3/uL (ref 0.0–0.1)
Basophils Relative: 3 %
Eosinophils Absolute: 0.4 10*3/uL (ref 0.0–0.5)
Eosinophils Relative: 10 %
HCT: 22.7 % — ABNORMAL LOW (ref 39.0–52.0)
Hemoglobin: 7.4 g/dL — ABNORMAL LOW (ref 13.0–17.0)
Immature Granulocytes: 3 %
Lymphocytes Relative: 9 %
Lymphs Abs: 0.3 10*3/uL — ABNORMAL LOW (ref 0.7–4.0)
MCH: 27 pg (ref 26.0–34.0)
MCHC: 32.6 g/dL (ref 30.0–36.0)
MCV: 82.8 fL (ref 80.0–100.0)
Monocytes Absolute: 0.2 10*3/uL (ref 0.1–1.0)
Monocytes Relative: 6 %
Neutro Abs: 2.3 10*3/uL (ref 1.7–7.7)
Neutrophils Relative %: 69 %
Platelet Count: 171 10*3/uL (ref 150–400)
RBC: 2.74 MIL/uL — ABNORMAL LOW (ref 4.22–5.81)
RDW: 14.6 % (ref 11.5–15.5)
WBC Count: 3.4 10*3/uL — ABNORMAL LOW (ref 4.0–10.5)
nRBC: 0 % (ref 0.0–0.2)

## 2023-02-03 LAB — BPAM RBC
Blood Product Expiration Date: 202405102359
ISSUE DATE / TIME: 202404181140

## 2023-02-03 LAB — PREPARE RBC (CROSSMATCH)

## 2023-02-03 LAB — TYPE AND SCREEN: Antibody Screen: NEGATIVE

## 2023-02-03 MED ORDER — METHYLPREDNISOLONE SODIUM SUCC 40 MG IJ SOLR
40.0000 mg | Freq: Once | INTRAMUSCULAR | Status: AC
  Administered 2023-02-03: 40 mg via INTRAVENOUS
  Filled 2023-02-03: qty 1

## 2023-02-03 MED ORDER — SODIUM CHLORIDE 0.9% FLUSH
10.0000 mL | INTRAVENOUS | Status: AC | PRN
  Administered 2023-02-03: 10 mL

## 2023-02-03 MED ORDER — ACETAMINOPHEN 325 MG PO TABS
650.0000 mg | ORAL_TABLET | Freq: Once | ORAL | Status: AC
  Administered 2023-02-03: 650 mg via ORAL
  Filled 2023-02-03: qty 2

## 2023-02-03 MED ORDER — SODIUM CHLORIDE 0.9% FLUSH
10.0000 mL | Freq: Once | INTRAVENOUS | Status: AC
  Administered 2023-02-03: 10 mL

## 2023-02-03 MED ORDER — HEPARIN SOD (PORK) LOCK FLUSH 100 UNIT/ML IV SOLN
500.0000 [IU] | Freq: Every day | INTRAVENOUS | Status: AC | PRN
  Administered 2023-02-03: 500 [IU]

## 2023-02-03 MED ORDER — SODIUM CHLORIDE 0.9% IV SOLUTION
250.0000 mL | Freq: Once | INTRAVENOUS | Status: AC
  Administered 2023-02-03: 250 mL via INTRAVENOUS

## 2023-02-03 NOTE — Patient Instructions (Signed)
Blood Transfusion, Adult A blood transfusion is a procedure in which you receive blood through an IV tube. You may need this procedure because of: A bleeding disorder. An illness. An injury. A surgery. The blood may come from someone else (a donor). You may also be able to donate blood for yourself before a surgery. The blood given in a transfusion may be made up of different types of cells. You may get: Red blood cells. These carry oxygen to the cells in the body. Platelets. These help your blood to clot. Plasma. This is the liquid part of your blood. It carries proteins and other substances through the body. White blood cells. These help you fight infections. If you have a clotting disorder, you may also get other types of blood products. Depending on the type of blood product, this procedure may take 1-4 hours to complete. Tell your doctor about: Any bleeding problems you have. Any reactions you have had during a blood transfusion in the past. Any allergies you have. All medicines you are taking, including vitamins, herbs, eye drops, creams, and over-the-counter medicines. Any surgeries you have had. Any medical conditions you have. Whether you are pregnant or may be pregnant. What are the risks? Talk with your health care provider about risks. The most common problems include: A mild allergic reaction. This includes red, swollen areas of skin (hives) and itching. Fever or chills. This may be the body's response to new blood cells received. This may happen during or up to 4 hours after the transfusion. More serious problems may include: A serious allergic reaction. This includes breathing trouble or swelling around the face and lips. Too much fluid in the lungs. This may cause breathing problems. Lung injury. This causes breathing trouble and low oxygen in the blood. This can happen within hours of the transfusion or days later. Too much iron. This can happen after getting many blood  transfusions over a period of time. An infection or virus passed through the blood. This is rare. Donated blood is carefully tested before it is given. Your body's defense system (immune system) trying to attack the new blood cells. This is rare. Symptoms may include fever, chills, nausea, low blood pressure, and low back or chest pain. Donated cells attacking healthy tissues. This is rare. What happens before the procedure? You will have a blood test to find out your blood type. The test also finds out what type of blood your body will accept and matches it to the donor type. If you are going to have a planned surgery, you may be able to donate your own blood. This may be done in case you need a transfusion. You will have your temperature, blood pressure, and pulse checked. You may receive medicine to help prevent an allergic reaction. This may be done if you have had a reaction to a transfusion before. This medicine may be given to you by mouth or through an IV tube. What happens during the procedure?  An IV tube will be put into one of your veins. The bag of blood will be attached to your IV tube. Then, the blood will enter through your vein. Your temperature, blood pressure, and pulse will be checked often. This is done to find early signs of a transfusion reaction. Tell your nurse right away if you have any of these symptoms: Shortness of breath or trouble breathing. Chest or back pain. Fever or chills. Red, swollen areas of skin or itching. If you have any signs   or symptoms of a reaction, your transfusion will be stopped. You may also be given medicine. When the transfusion is finished, your IV tube will be taken out. Pressure may be put on the IV site for a few minutes. A bandage (dressing) will be put on the IV site. The procedure may vary among doctors and hospitals. What happens after the procedure? You will be monitored until you leave the hospital or clinic. This includes  checking your temperature, blood pressure, pulse, breathing rate, and blood oxygen level. Your blood may be tested to see how you have responded to the transfusion. You may be warmed with fluids or blankets. This is done to keep the temperature of your body normal. If you have your procedure in an outpatient setting, you will be told whom to contact to report any reactions. Where to find more information Visit the [employer]: redcross.org Summary A blood transfusion is a procedure in which you receive blood through an IV tube. The blood you are given may be made up of different blood cells. You may receive red blood cells, platelets, plasma, or white blood cells. Your temperature, blood pressure, and pulse will be checked often. After the procedure, your blood may be tested to see how you have responded. This information is not intended to replace advice given to you by your health care provider. Make sure you discuss any questions you have with your health care provider. Document Revised: 01/01/2022 Document Reviewed: 01/01/2022 Elsevier Patient Education  2023 [employer]  

## 2023-02-04 LAB — TYPE AND SCREEN
ABO/RH(D): A POS
Unit division: 0

## 2023-02-04 LAB — BPAM RBC: Unit Type and Rh: 6200

## 2023-02-07 DIAGNOSIS — D469 Myelodysplastic syndrome, unspecified: Secondary | ICD-10-CM | POA: Diagnosis not present

## 2023-02-10 ENCOUNTER — Other Ambulatory Visit: Payer: Self-pay

## 2023-02-10 ENCOUNTER — Inpatient Hospital Stay: Payer: Medicare Other

## 2023-02-10 DIAGNOSIS — D469 Myelodysplastic syndrome, unspecified: Secondary | ICD-10-CM

## 2023-02-10 DIAGNOSIS — D464 Refractory anemia, unspecified: Secondary | ICD-10-CM | POA: Diagnosis not present

## 2023-02-10 DIAGNOSIS — Z87891 Personal history of nicotine dependence: Secondary | ICD-10-CM | POA: Diagnosis not present

## 2023-02-10 DIAGNOSIS — Z86010 Personal history of colonic polyps: Secondary | ICD-10-CM | POA: Diagnosis not present

## 2023-02-10 DIAGNOSIS — Z87442 Personal history of urinary calculi: Secondary | ICD-10-CM | POA: Diagnosis not present

## 2023-02-10 DIAGNOSIS — K219 Gastro-esophageal reflux disease without esophagitis: Secondary | ICD-10-CM | POA: Diagnosis not present

## 2023-02-10 DIAGNOSIS — D75839 Thrombocytosis, unspecified: Secondary | ICD-10-CM | POA: Diagnosis not present

## 2023-02-10 DIAGNOSIS — E785 Hyperlipidemia, unspecified: Secondary | ICD-10-CM | POA: Diagnosis not present

## 2023-02-10 DIAGNOSIS — Z5112 Encounter for antineoplastic immunotherapy: Secondary | ICD-10-CM | POA: Diagnosis not present

## 2023-02-10 DIAGNOSIS — M129 Arthropathy, unspecified: Secondary | ICD-10-CM | POA: Diagnosis not present

## 2023-02-10 DIAGNOSIS — D72828 Other elevated white blood cell count: Secondary | ICD-10-CM | POA: Diagnosis not present

## 2023-02-10 DIAGNOSIS — Z79899 Other long term (current) drug therapy: Secondary | ICD-10-CM | POA: Diagnosis not present

## 2023-02-10 LAB — CBC WITH DIFFERENTIAL (CANCER CENTER ONLY)
Abs Immature Granulocytes: 0.16 10*3/uL — ABNORMAL HIGH (ref 0.00–0.07)
Basophils Absolute: 0.1 10*3/uL (ref 0.0–0.1)
Basophils Relative: 3 %
Eosinophils Absolute: 0.2 10*3/uL (ref 0.0–0.5)
Eosinophils Relative: 7 %
HCT: 21.9 % — ABNORMAL LOW (ref 39.0–52.0)
Hemoglobin: 7.2 g/dL — ABNORMAL LOW (ref 13.0–17.0)
Immature Granulocytes: 5 %
Lymphocytes Relative: 9 %
Lymphs Abs: 0.3 10*3/uL — ABNORMAL LOW (ref 0.7–4.0)
MCH: 27.8 pg (ref 26.0–34.0)
MCHC: 32.9 g/dL (ref 30.0–36.0)
MCV: 84.6 fL (ref 80.0–100.0)
Monocytes Absolute: 0.2 10*3/uL (ref 0.1–1.0)
Monocytes Relative: 7 %
Neutro Abs: 2.3 10*3/uL (ref 1.7–7.7)
Neutrophils Relative %: 69 %
Platelet Count: 306 10*3/uL (ref 150–400)
RBC: 2.59 MIL/uL — ABNORMAL LOW (ref 4.22–5.81)
RDW: 15.2 % (ref 11.5–15.5)
WBC Count: 3.3 10*3/uL — ABNORMAL LOW (ref 4.0–10.5)
nRBC: 0 % (ref 0.0–0.2)

## 2023-02-10 LAB — PREPARE RBC (CROSSMATCH)

## 2023-02-10 LAB — TYPE AND SCREEN: Antibody Screen: NEGATIVE

## 2023-02-10 LAB — SAMPLE TO BLOOD BANK

## 2023-02-10 LAB — BPAM RBC: Blood Product Expiration Date: 202405212359

## 2023-02-10 MED ORDER — SODIUM CHLORIDE 0.9% FLUSH
3.0000 mL | INTRAVENOUS | Status: AC | PRN
  Administered 2023-02-10: 3 mL

## 2023-02-10 MED ORDER — ACETAMINOPHEN 325 MG PO TABS
650.0000 mg | ORAL_TABLET | Freq: Once | ORAL | Status: AC
  Administered 2023-02-10: 650 mg via ORAL
  Filled 2023-02-10: qty 2

## 2023-02-10 MED ORDER — SODIUM CHLORIDE 0.9% FLUSH: 3.0000 mL | INTRAVENOUS | Status: DC | PRN

## 2023-02-10 MED ORDER — METHYLPREDNISOLONE SODIUM SUCC 40 MG IJ SOLR
40.0000 mg | Freq: Once | INTRAMUSCULAR | Status: AC
  Administered 2023-02-10: 40 mg via INTRAVENOUS
  Filled 2023-02-10: qty 1

## 2023-02-10 MED ORDER — SODIUM CHLORIDE 0.9% FLUSH
10.0000 mL | Freq: Once | INTRAVENOUS | Status: AC
  Administered 2023-02-10: 10 mL

## 2023-02-10 MED ORDER — HEPARIN SOD (PORK) LOCK FLUSH 100 UNIT/ML IV SOLN
250.0000 [IU] | INTRAVENOUS | Status: AC | PRN
  Administered 2023-02-10: 250 [IU]

## 2023-02-10 NOTE — Patient Instructions (Signed)
Blood Transfusion, Adult, Care After The following information offers guidance on how to care for yourself after your procedure. Your health care provider may also give you more specific instructions. If you have problems or questions, contact your health care provider. What can I expect after the procedure? After the procedure, it is common to have: Bruising and soreness where the IV was inserted. A headache. Follow these instructions at home: IV insertion site care     Follow instructions from your health care provider about how to take care of your IV insertion site. Make sure you: Wash your hands with soap and water for at least 20 seconds before and after you change your bandage (dressing). If soap and water are not available, use hand sanitizer. Change your dressing as told by your health care provider. Check your IV insertion site every day for signs of infection. Check for: Redness, swelling, or pain. Bleeding from the site. Warmth. Pus or a bad smell. General instructions Take over-the-counter and prescription medicines only as told by your health care provider. Rest as told by your health care provider. Return to your normal activities as told by your health care provider. Keep all follow-up visits. Lab tests may need to be done at certain periods to recheck your blood counts. Contact a health care provider if: You have itching or red, swollen areas of skin (hives). You have a fever or chills. You have pain in the head, back, or chest. You feel anxious or you feel weak after doing your normal activities. You have redness, swelling, warmth, or pain around the IV insertion site. You have blood coming from the IV insertion site that does not stop with pressure. You have pus or a bad smell coming from your IV insertion site. If you received your blood transfusion in an outpatient setting, you will be told whom to contact to report any reactions. Get help right away if: You  have symptoms of a serious allergic or immune system reaction, including: Trouble breathing or shortness of breath. Swelling of the face, feeling flushed, or widespread rash. Dark urine or blood in the urine. Fast heartbeat. These symptoms may be an emergency. Get help right away. Call 911. Do not wait to see if the symptoms will go away. Do not drive yourself to the hospital. Summary Bruising and soreness around the IV insertion site are common. Check your IV insertion site every day for signs of infection. Rest as told by your health care provider. Return to your normal activities as told by your health care provider. Get help right away for symptoms of a serious allergic or immune system reaction to the blood transfusion. This information is not intended to replace advice given to you by your health care provider. Make sure you discuss any questions you have with your health care provider. Document Revised: 01/01/2022 Document Reviewed: 01/01/2022 Elsevier Patient Education  2023 Elsevier Inc.  

## 2023-02-11 LAB — TYPE AND SCREEN
ABO/RH(D): A POS
Unit division: 0

## 2023-02-11 LAB — BPAM RBC
ISSUE DATE / TIME: 202404251107
Unit Type and Rh: 6200

## 2023-02-15 ENCOUNTER — Other Ambulatory Visit: Payer: Self-pay

## 2023-02-15 DIAGNOSIS — D469 Myelodysplastic syndrome, unspecified: Secondary | ICD-10-CM

## 2023-02-17 ENCOUNTER — Inpatient Hospital Stay: Payer: Medicare Other | Attending: Hematology

## 2023-02-17 ENCOUNTER — Other Ambulatory Visit: Payer: Self-pay

## 2023-02-17 ENCOUNTER — Inpatient Hospital Stay: Payer: Medicare Other

## 2023-02-17 VITALS — BP 107/60 | HR 70 | Temp 97.8°F | Resp 16

## 2023-02-17 DIAGNOSIS — Z7982 Long term (current) use of aspirin: Secondary | ICD-10-CM | POA: Insufficient documentation

## 2023-02-17 DIAGNOSIS — D72829 Elevated white blood cell count, unspecified: Secondary | ICD-10-CM | POA: Diagnosis not present

## 2023-02-17 DIAGNOSIS — N202 Calculus of kidney with calculus of ureter: Secondary | ICD-10-CM | POA: Diagnosis not present

## 2023-02-17 DIAGNOSIS — M109 Gout, unspecified: Secondary | ICD-10-CM | POA: Insufficient documentation

## 2023-02-17 DIAGNOSIS — Z86010 Personal history of colonic polyps: Secondary | ICD-10-CM | POA: Insufficient documentation

## 2023-02-17 DIAGNOSIS — Z87891 Personal history of nicotine dependence: Secondary | ICD-10-CM | POA: Insufficient documentation

## 2023-02-17 DIAGNOSIS — J90 Pleural effusion, not elsewhere classified: Secondary | ICD-10-CM | POA: Insufficient documentation

## 2023-02-17 DIAGNOSIS — Z5112 Encounter for antineoplastic immunotherapy: Secondary | ICD-10-CM | POA: Diagnosis not present

## 2023-02-17 DIAGNOSIS — M1711 Unilateral primary osteoarthritis, right knee: Secondary | ICD-10-CM | POA: Diagnosis not present

## 2023-02-17 DIAGNOSIS — E785 Hyperlipidemia, unspecified: Secondary | ICD-10-CM | POA: Insufficient documentation

## 2023-02-17 DIAGNOSIS — M25472 Effusion, left ankle: Secondary | ICD-10-CM | POA: Insufficient documentation

## 2023-02-17 DIAGNOSIS — D539 Nutritional anemia, unspecified: Secondary | ICD-10-CM | POA: Diagnosis not present

## 2023-02-17 DIAGNOSIS — D469 Myelodysplastic syndrome, unspecified: Secondary | ICD-10-CM

## 2023-02-17 DIAGNOSIS — R161 Splenomegaly, not elsewhere classified: Secondary | ICD-10-CM | POA: Insufficient documentation

## 2023-02-17 DIAGNOSIS — K219 Gastro-esophageal reflux disease without esophagitis: Secondary | ICD-10-CM | POA: Diagnosis not present

## 2023-02-17 DIAGNOSIS — I7090 Unspecified atherosclerosis: Secondary | ICD-10-CM | POA: Insufficient documentation

## 2023-02-17 DIAGNOSIS — D464 Refractory anemia, unspecified: Secondary | ICD-10-CM | POA: Insufficient documentation

## 2023-02-17 DIAGNOSIS — K449 Diaphragmatic hernia without obstruction or gangrene: Secondary | ICD-10-CM | POA: Diagnosis not present

## 2023-02-17 LAB — CMP (CANCER CENTER ONLY)
ALT: 19 U/L (ref 0–44)
AST: 12 U/L — ABNORMAL LOW (ref 15–41)
Albumin: 3.7 g/dL (ref 3.5–5.0)
Alkaline Phosphatase: 61 U/L (ref 38–126)
Anion gap: 4 — ABNORMAL LOW (ref 5–15)
BUN: 16 mg/dL (ref 8–23)
CO2: 27 mmol/L (ref 22–32)
Calcium: 8.2 mg/dL — ABNORMAL LOW (ref 8.9–10.3)
Chloride: 106 mmol/L (ref 98–111)
Creatinine: 0.84 mg/dL (ref 0.61–1.24)
GFR, Estimated: >60 mL/min (ref >60)
Glucose, Bld: 177 mg/dL — ABNORMAL HIGH (ref 70–99)
Potassium: 4.5 mmol/L (ref 3.5–5.1)
Sodium: 137 mmol/L (ref 135–145)
Total Bilirubin: 0.7 mg/dL (ref 0.3–1.2)
Total Protein: 5.5 g/dL — ABNORMAL LOW (ref 6.5–8.1)

## 2023-02-17 LAB — CBC WITH DIFFERENTIAL (CANCER CENTER ONLY)
Abs Immature Granulocytes: 0.3 10*3/uL — ABNORMAL HIGH (ref 0.00–0.07)
Basophils Absolute: 0.3 10*3/uL — ABNORMAL HIGH (ref 0.0–0.1)
Basophils Relative: 7 %
Eosinophils Absolute: 0.3 10*3/uL (ref 0.0–0.5)
Eosinophils Relative: 8 %
HCT: 21.1 % — ABNORMAL LOW (ref 39.0–52.0)
Hemoglobin: 7 g/dL — ABNORMAL LOW (ref 13.0–17.0)
Immature Granulocytes: 7 %
Lymphocytes Relative: 13 %
Lymphs Abs: 0.5 10*3/uL — ABNORMAL LOW (ref 0.7–4.0)
MCH: 28.9 pg (ref 26.0–34.0)
MCHC: 33.2 g/dL (ref 30.0–36.0)
MCV: 87.2 fL (ref 80.0–100.0)
Monocytes Absolute: 0.3 10*3/uL (ref 0.1–1.0)
Monocytes Relative: 6 %
Neutro Abs: 2.5 10*3/uL (ref 1.7–7.7)
Neutrophils Relative %: 59 %
Platelet Count: 537 10*3/uL — ABNORMAL HIGH (ref 150–400)
RBC: 2.42 MIL/uL — ABNORMAL LOW (ref 4.22–5.81)
RDW: 16.6 % — ABNORMAL HIGH (ref 11.5–15.5)
WBC Count: 4.1 10*3/uL (ref 4.0–10.5)
nRBC: 0 % (ref 0.0–0.2)

## 2023-02-17 LAB — TYPE AND SCREEN: Unit division: 0

## 2023-02-17 LAB — PREPARE RBC (CROSSMATCH)

## 2023-02-17 LAB — SAMPLE TO BLOOD BANK

## 2023-02-17 LAB — BPAM RBC

## 2023-02-17 MED ORDER — ACETAMINOPHEN 325 MG PO TABS
650.0000 mg | ORAL_TABLET | Freq: Once | ORAL | Status: AC
  Administered 2023-02-17: 650 mg via ORAL
  Filled 2023-02-17: qty 2

## 2023-02-17 MED ORDER — SODIUM CHLORIDE 0.9% FLUSH: 3.0000 mL | INTRAVENOUS | Status: DC | PRN

## 2023-02-17 MED ORDER — METHYLPREDNISOLONE SODIUM SUCC 40 MG IJ SOLR
40.0000 mg | Freq: Once | INTRAMUSCULAR | Status: AC
  Administered 2023-02-17: 40 mg via INTRAVENOUS
  Filled 2023-02-17: qty 1

## 2023-02-17 MED ORDER — HEPARIN SOD (PORK) LOCK FLUSH 100 UNIT/ML IV SOLN
500.0000 [IU] | Freq: Once | INTRAVENOUS | Status: AC
  Administered 2023-02-17: 500 [IU]

## 2023-02-17 MED ORDER — SODIUM CHLORIDE 0.9% FLUSH
10.0000 mL | Freq: Once | INTRAVENOUS | Status: AC
  Administered 2023-02-17: 10 mL

## 2023-02-17 MED ORDER — HEPARIN SOD (PORK) LOCK FLUSH 100 UNIT/ML IV SOLN: 250.0000 [IU] | INTRAVENOUS | Status: DC | PRN

## 2023-02-17 MED ORDER — SODIUM CHLORIDE 0.9% IV SOLUTION
250.0000 mL | Freq: Once | INTRAVENOUS | Status: AC
  Administered 2023-02-17: 250 mL via INTRAVENOUS

## 2023-02-18 LAB — BPAM RBC
Blood Product Expiration Date: 202405252359
ISSUE DATE / TIME: 202405021118
Unit Type and Rh: 6200

## 2023-02-18 LAB — TYPE AND SCREEN
ABO/RH(D): A POS
Antibody Screen: NEGATIVE

## 2023-02-21 ENCOUNTER — Other Ambulatory Visit: Payer: Self-pay

## 2023-02-21 ENCOUNTER — Inpatient Hospital Stay: Payer: Medicare Other

## 2023-02-21 ENCOUNTER — Inpatient Hospital Stay (HOSPITAL_BASED_OUTPATIENT_CLINIC_OR_DEPARTMENT_OTHER): Payer: Medicare Other | Admitting: Hematology

## 2023-02-21 VITALS — BP 105/66 | HR 72 | Temp 98.1°F | Resp 14 | Wt 162.0 lb

## 2023-02-21 VITALS — BP 94/60 | HR 68

## 2023-02-21 DIAGNOSIS — M109 Gout, unspecified: Secondary | ICD-10-CM | POA: Diagnosis not present

## 2023-02-21 DIAGNOSIS — Z87891 Personal history of nicotine dependence: Secondary | ICD-10-CM | POA: Diagnosis not present

## 2023-02-21 DIAGNOSIS — K219 Gastro-esophageal reflux disease without esophagitis: Secondary | ICD-10-CM | POA: Diagnosis not present

## 2023-02-21 DIAGNOSIS — D649 Anemia, unspecified: Secondary | ICD-10-CM

## 2023-02-21 DIAGNOSIS — D469 Myelodysplastic syndrome, unspecified: Secondary | ICD-10-CM | POA: Diagnosis not present

## 2023-02-21 DIAGNOSIS — D464 Refractory anemia, unspecified: Secondary | ICD-10-CM | POA: Diagnosis not present

## 2023-02-21 DIAGNOSIS — J90 Pleural effusion, not elsewhere classified: Secondary | ICD-10-CM | POA: Diagnosis not present

## 2023-02-21 DIAGNOSIS — Z86010 Personal history of colonic polyps: Secondary | ICD-10-CM | POA: Diagnosis not present

## 2023-02-21 DIAGNOSIS — Z5112 Encounter for antineoplastic immunotherapy: Secondary | ICD-10-CM | POA: Diagnosis not present

## 2023-02-21 DIAGNOSIS — Z7982 Long term (current) use of aspirin: Secondary | ICD-10-CM | POA: Diagnosis not present

## 2023-02-21 DIAGNOSIS — I7090 Unspecified atherosclerosis: Secondary | ICD-10-CM | POA: Diagnosis not present

## 2023-02-21 DIAGNOSIS — M1711 Unilateral primary osteoarthritis, right knee: Secondary | ICD-10-CM | POA: Diagnosis not present

## 2023-02-21 DIAGNOSIS — D539 Nutritional anemia, unspecified: Secondary | ICD-10-CM | POA: Diagnosis not present

## 2023-02-21 DIAGNOSIS — D72829 Elevated white blood cell count, unspecified: Secondary | ICD-10-CM | POA: Diagnosis not present

## 2023-02-21 DIAGNOSIS — R161 Splenomegaly, not elsewhere classified: Secondary | ICD-10-CM | POA: Diagnosis not present

## 2023-02-21 DIAGNOSIS — N202 Calculus of kidney with calculus of ureter: Secondary | ICD-10-CM | POA: Diagnosis not present

## 2023-02-21 DIAGNOSIS — E785 Hyperlipidemia, unspecified: Secondary | ICD-10-CM | POA: Diagnosis not present

## 2023-02-21 DIAGNOSIS — M25472 Effusion, left ankle: Secondary | ICD-10-CM | POA: Diagnosis not present

## 2023-02-21 DIAGNOSIS — K449 Diaphragmatic hernia without obstruction or gangrene: Secondary | ICD-10-CM | POA: Diagnosis not present

## 2023-02-21 LAB — CBC WITH DIFFERENTIAL (CANCER CENTER ONLY)
Abs Immature Granulocytes: 0.33 K/uL — ABNORMAL HIGH (ref 0.00–0.07)
Basophils Absolute: 0.3 K/uL — ABNORMAL HIGH (ref 0.0–0.1)
Basophils Relative: 6 %
Eosinophils Absolute: 0.6 K/uL — ABNORMAL HIGH (ref 0.0–0.5)
Eosinophils Relative: 13 %
HCT: 22.6 % — ABNORMAL LOW (ref 39.0–52.0)
Hemoglobin: 7.3 g/dL — ABNORMAL LOW (ref 13.0–17.0)
Immature Granulocytes: 7 %
Lymphocytes Relative: 12 %
Lymphs Abs: 0.6 K/uL — ABNORMAL LOW (ref 0.7–4.0)
MCH: 27.9 pg (ref 26.0–34.0)
MCHC: 32.3 g/dL (ref 30.0–36.0)
MCV: 86.3 fL (ref 80.0–100.0)
Monocytes Absolute: 0.4 K/uL (ref 0.1–1.0)
Monocytes Relative: 9 %
Neutro Abs: 2.4 K/uL (ref 1.7–7.7)
Neutrophils Relative %: 53 %
Platelet Count: 525 K/uL — ABNORMAL HIGH (ref 150–400)
RBC: 2.62 MIL/uL — ABNORMAL LOW (ref 4.22–5.81)
RDW: 16.5 % — ABNORMAL HIGH (ref 11.5–15.5)
WBC Count: 4.6 K/uL (ref 4.0–10.5)
nRBC: 0 % (ref 0.0–0.2)

## 2023-02-21 LAB — CMP (CANCER CENTER ONLY)
ALT: 21 U/L (ref 0–44)
AST: 12 U/L — ABNORMAL LOW (ref 15–41)
Albumin: 3.9 g/dL (ref 3.5–5.0)
Alkaline Phosphatase: 60 U/L (ref 38–126)
Anion gap: 5 (ref 5–15)
BUN: 19 mg/dL (ref 8–23)
CO2: 27 mmol/L (ref 22–32)
Calcium: 8.3 mg/dL — ABNORMAL LOW (ref 8.9–10.3)
Chloride: 106 mmol/L (ref 98–111)
Creatinine: 0.85 mg/dL (ref 0.61–1.24)
GFR, Estimated: >60 mL/min (ref >60)
Glucose, Bld: 96 mg/dL (ref 70–99)
Potassium: 4.5 mmol/L (ref 3.5–5.1)
Sodium: 138 mmol/L (ref 135–145)
Total Bilirubin: 0.6 mg/dL (ref 0.3–1.2)
Total Protein: 5.7 g/dL — ABNORMAL LOW (ref 6.5–8.1)

## 2023-02-21 LAB — PREPARE RBC (CROSSMATCH)

## 2023-02-21 LAB — SAMPLE TO BLOOD BANK

## 2023-02-21 LAB — TYPE AND SCREEN: Antibody Screen: NEGATIVE

## 2023-02-21 LAB — BPAM RBC

## 2023-02-21 MED ORDER — ONDANSETRON HCL 8 MG PO TABS
8.0000 mg | ORAL_TABLET | Freq: Once | ORAL | Status: AC
  Administered 2023-02-21: 8 mg via ORAL
  Filled 2023-02-21: qty 1

## 2023-02-21 MED ORDER — SODIUM CHLORIDE 0.9% FLUSH
10.0000 mL | Freq: Once | INTRAVENOUS | Status: AC
  Administered 2023-02-21: 10 mL

## 2023-02-21 MED ORDER — SODIUM CHLORIDE 0.9 % IV SOLN
75.0000 mg/m2 | Freq: Once | INTRAVENOUS | Status: AC
  Administered 2023-02-21: 141 mg via INTRAVENOUS
  Filled 2023-02-21: qty 14.1

## 2023-02-21 MED ORDER — HEPARIN SOD (PORK) LOCK FLUSH 100 UNIT/ML IV SOLN
500.0000 [IU] | Freq: Once | INTRAVENOUS | Status: AC | PRN
  Administered 2023-02-21: 500 [IU]

## 2023-02-21 MED ORDER — SODIUM CHLORIDE 0.9 % IV SOLN: Freq: Once | INTRAVENOUS | Status: AC

## 2023-02-21 MED ORDER — SODIUM CHLORIDE 0.9% FLUSH
10.0000 mL | INTRAVENOUS | Status: DC | PRN
  Administered 2023-02-21: 10 mL

## 2023-02-21 NOTE — Progress Notes (Signed)
Patient seen by Dr. Addison Naegeli are within treatment parameters.  Labs reviewed: and are not all within treatment parameters. Hgb 7.3 Dr Candise Che aware,   Per physician team, patient is ready for treatment and there are NO modifications to the treatment plan./ Pt needs a sample to BB drawn.

## 2023-02-21 NOTE — Progress Notes (Signed)
HEMATOLOGY/ONCOLOGY PROGRESS NOTE:   Date of Service: 02/21/23  Patient Care Team: Jackelyn Poling, DO as PCP - General (Family Medicine)  CHIEF COMPLAINTS:  Follow-up for continued evaluation and management of JAK2 positive myeloproliferative neoplasm/MDS  INTERVAL HISTORY:  [name] is a 77 y.o. male here for continued evaluation and management of his MPN/MDS with refractory anemia and thrombocytosis. He is here to start cycle 7 of Vidaza.  Patient was last seen by me on 01/24/2023 and he complained of feeling full due to enlarged spleen, discomfort near his spleen, and chronic left ankle swelling.   Patient is accompanied by his wife during this visit. He reports he has been doing fairly well since our last visit. He is scheduled for bone marrow biopsy on 03/16/2023 at [facility] before his next cycle of his treatment.  He denies fever, chills, night sweats, infection issues, abdominal pain, chest pain, back pain, or leg swelling.   Patient notes he tolerated his last cycle 6 of his Vidaza treatment well without any new or severe toxicities.   Patient's wife notes that the patient is not being able to eat as much as he used to, which has been causing him to lose weight. Patient notes he has not been able to eat as much as he wants because of feeling full due to his enlarged spleen. Patient notes he has lost around 20 lbs in the past year.   MEDICAL HISTORY:  Past Medical History:  Diagnosis Date   Arthritis of right knee    Cervicalgia    Chest discomfort    normal stress echo   Chest pain    Colon polyps    Dyslipidemia    Fluttering sensation of heart    GERD without esophagitis    Hematochezia    Hyperglycemia    Hyperlipidemia    Internal hemorrhoids    Kidney stones    Male erectile dysfunction, unspecified    Mixed dyslipidemia    Mixed hyperlipidemia    Ocular migraine    PAC (premature atrial contraction)    Personal history of colonic polyps     Prediabetes    Residual hemorrhoidal skin tags    Spleen enlarged    nov 2023 hospitalized   Unspecified hemorrhoids     SURGICAL HISTORY: Past Surgical History:  Procedure Laterality Date   BONE MARROW BIOPSY     IR IMAGING GUIDED PORT INSERTION  09/14/2022   KIDNEY STONE SURGERY     retrieval    SOCIAL HISTORY: Social History   Socioeconomic History   Marital status: Married    Spouse name: Not on file   Number of children: Not on file   Years of education: Not on file   Highest education level: Not on file  Occupational History   Not on file  Tobacco Use   Smoking status: Former    Types: Cigarettes   Smokeless tobacco: Never  Substance and Sexual Activity   Alcohol use: Yes    Alcohol/week: 1.0 standard drink of alcohol    Types: 1 Cans of beer per week   Drug use: Not on file   Sexual activity: Not on file  Other Topics Concern   Not on file  Social History Narrative   Not on file   Social Determinants of Health   Financial Resource Strain: Not on file  Food Insecurity: Unknown (08/31/2022)   Hunger Vital Sign    Worried About Running Out of Food in the Last  Year: Never true    Ran Out of Food in the Last Year: Not on file  Transportation Needs: No Transportation Needs (08/31/2022)   PRAPARE - Administrator, Civil Service (Medical): No    Lack of Transportation (Non-Medical): No  Physical Activity: Not on file  Stress: Not on file  Social Connections: Not on file  Intimate Partner Violence: Not on file    FAMILY HISTORY: Family History  Problem Relation Age of Onset   Stroke Brother    Congestive Heart Failure Brother     ALLERGIES:  has No Known Allergies.  MEDICATIONS:  Current Outpatient Medications  Medication Sig Dispense Refill   ALFUZOSIN HCL ER PO Take 1 tablet by mouth daily after supper.     aspirin EC 81 MG tablet Take 81 mg by mouth daily. Swallow whole.     B Complex Vitamins (B COMPLEX PO) Take 1 tablet by mouth  daily.     Cholecalciferol (VITAMIN D3) 50 MCG (2000 UT) TABS Take 2,000 Units by mouth daily.     docusate sodium (COLACE) 100 MG capsule Take 100 mg by mouth 2 (two) times daily.     FIBER PO Take 1 capsule by mouth daily. Unknown strength     fish oil-omega-3 fatty acids 1000 MG capsule Take 1 g by mouth 2 (two) times daily. Strength 300mg /1500mg      Flaxseed, Linseed, (FLAXSEED OIL) 1000 MG CAPS Take 1,000 mg by mouth daily.     GLUCOSAMINE CHONDROITIN MSM PO Take 1 tablet by mouth daily. Strength 1500/1500     lidocaine-prilocaine (EMLA) cream Apply to affected area once (Patient taking differently: Apply 1 Application topically once. Apply to affected area once) 30 g 3   Misc Natural Products (PROSTATE SUPPORT PO) Take 1 capsule by mouth daily. Unknown strength     Multiple Vitamin (MULTIVITAMIN) tablet Take 1 tablet by mouth daily. Unknown strength     ondansetron (ZOFRAN) 8 MG tablet Take 1 tablet (8 mg total) by mouth every 8 (eight) hours as needed for nausea or vomiting. 30 tablet 1   prochlorperazine (COMPAZINE) 10 MG tablet Take 1 tablet (10 mg total) by mouth every 6 (six) hours as needed for nausea or vomiting. 30 tablet 1   psyllium (METAMUCIL) 58.6 % powder Take 1 packet by mouth daily.     senna-docusate (SENNA S) 8.6-50 MG tablet Take 2 tablets by mouth at bedtime. 60 tablet 1   traMADol (ULTRAM) 50 MG tablet Take 50 mg by mouth as needed for moderate pain or severe pain.     Turmeric (QC TUMERIC COMPLEX PO) Take 750 mg by mouth daily.     vitamin C (ASCORBIC ACID) 250 MG tablet Take 500 mg by mouth daily.     No current facility-administered medications for this visit.    REVIEW OF SYSTEMS:   10 Point review of Systems was done is negative except as noted above.   PHYSICAL EXAMINATION: .BP 105/66 (BP Location: Left Arm, Patient Position: Sitting)   Pulse 72   Temp 98.1 F (36.7 C) (Temporal)   Resp 14   Wt 162 lb (73.5 kg)   SpO2 100%   BMI 25.37 kg/m   NAD GENERAL:alert, in no acute distress and comfortable SKIN: no acute rashes, no significant lesions EYES: conjunctiva are pink and non-injected, sclera anicteric NECK: supple, no JVD LYMPH:  no palpable lymphadenopathy in the cervical, axillary or inguinal regions LUNGS: clear to auscultation b/l with normal respiratory effort HEART: regular  rate & rhythm ABDOMEN:  normoactive bowel sounds , non tender, not distended. Splenomegaly  Extremity: no pedal edema PSYCH: alert & oriented x 3 with fluent speech NEURO: no focal motor/sensory deficits  LABORATORY DATA:  I have reviewed the data as listed .    Latest Ref Rng & Units 02/24/2023    9:10 AM 02/21/2023   11:53 AM 02/17/2023    9:08 AM  CBC  WBC 4.0 - 10.5 K/uL 4.6  4.6  4.1   Hemoglobin 13.0 - 17.0 g/dL 7.9  7.3  7.0   Hematocrit 39.0 - 52.0 % 24.3  22.6  21.1   Platelets 150 - 400 K/uL 469  525  537      Iron/TIBC/Ferritin/ %Sat    Component Value Date/Time   IRON 180 09/30/2022 0924   TIBC 193 (L) 09/30/2022 0924   FERRITIN 1,450 (H) 09/30/2022 0924   IRONPCTSAT 93 (H) 09/30/2022 0924  .[name] Kitchen    Latest Ref Rng & Units 02/24/2023    9:10 AM 02/21/2023   11:53 AM 02/17/2023    9:08 AM  CMP  Glucose 70 - 99 mg/dL 161  96  096   BUN 8 - 23 mg/dL 24  19  16    Creatinine 0.61 - 1.24 mg/dL 0.45  4.09  8.11   Sodium 135 - 145 mmol/L 138  138  137   Potassium 3.5 - 5.1 mmol/L 4.1  4.5  4.5   Chloride 98 - 111 mmol/L 105  106  106   CO2 22 - 32 mmol/L 28  27  27    Calcium 8.9 - 10.3 mg/dL 8.4  8.3  8.2   Total Protein 6.5 - 8.1 g/dL 5.8  5.7  5.5   Total Bilirubin 0.3 - 1.2 mg/dL 0.5  0.6  0.7   Alkaline Phos 38 - 126 U/L 68  60  61   AST 15 - 41 U/L 16  12  12    ALT 0 - 44 U/L 26  21  19     . Lab Results  Component Value Date   LDH 110 01/04/2022    02/23/2021 BCR ABL    02/23/2021 JAK2   Surgical Pathology  CASE: [id number]  PATIENT: Forest Hanway  Bone Marrow Report  Clinical History: MPN with progressive  anemia  (BH)  DIAGNOSIS:   BONE MARROW, ASPIRATE, CLOT, CORE:  -Hypercellular bone marrow with features of myeloid neoplasm  -See comment   PERIPHERAL BLOOD:  -Macrocytic anemia  -Leukocytosis  -Thrombocytosis   COMMENT:   The bone marrow/peripheral blood show persistent involvement by  previously known myeloid neoplasm.  There is a myeloproliferative  component as supported by previous JAK2 positivity.  However, there are  also dyspoietic changes primarily involving the megakaryocytic cell line  with numerous hypolobated/unilobated forms in addition to  dysgranulopoiesis to a lesser extent.  This is associated with  eosinophilia.  It is not entirely clear whether the overall findings  represent a myeloproliferative neoplasm with treatment related changes  or represent a primary myeloproliferative/myelodysplastic neoplasm  including but not limited to myeloid neoplasms with eosinophilia.  Correlation with cytogenetic and FISH studies strongly recommended.      RADIOGRAPHIC STUDIES: I have personally reviewed the radiological images as listed and agreed with the findings in the report. No results found.   IR IMAGING GUIDED PORT INSERTION  Result Date: 09/14/2022 INDICATION: Poor IV access.  Myelodysplastic syndrome EXAM: IMPLANTED PORT A CATH PLACEMENT WITH ULTRASOUND AND FLUOROSCOPIC GUIDANCE MEDICATIONS: None ANESTHESIA/SEDATION: Moderate (  conscious) sedation was employed during this procedure. A total of Versed 3 mg and Fentanyl 100 mcg was administered intravenously. Moderate Sedation Time: 20 minutes. The patient's level of consciousness and vital signs were monitored continuously by radiology nursing throughout the procedure under my direct supervision. FLUOROSCOPY TIME:  Fluoroscopic dose; 1 mGy COMPLICATIONS: None immediate. PROCEDURE: The procedure, risks, benefits, and alternatives were explained to the patient. Questions regarding the procedure were encouraged and  answered. The patient understands and consents to the procedure. The RIGHT neck and chest were prepped with chlorhexidine in a sterile fashion, and a sterile drape was applied covering the operative field. Maximum barrier sterile technique with sterile gowns and gloves were used for the procedure. A timeout was performed prior to the initiation of the procedure. Local anesthesia was provided with 1% lidocaine with epinephrine. After creating a small venotomy incision, a micropuncture kit was utilized to access the internal jugular vein under direct, real-time ultrasound guidance. Ultrasound image documentation was performed. The microwire was kinked to measure appropriate catheter length. A subcutaneous port pocket was then created along the upper chest wall utilizing a combination of sharp and blunt dissection. The pocket was irrigated with sterile saline. A single lumen ISP power injectable port was chosen for placement. The 8 Fr catheter was tunneled from the port pocket site to the venotomy incision. The port was placed in the pocket. The external catheter was trimmed to appropriate length. At the venotomy, an 8 Fr peel-away sheath was placed over a guidewire under fluoroscopic guidance. The catheter was then placed through the sheath and the sheath was removed. Final catheter positioning was confirmed and documented with a fluoroscopic spot radiograph. The port was accessed with a Huber needle, aspirated and flushed with heparinized saline. The port pocket incision was closed with interrupted 3-0 Vicryl suture then Dermabond was applied, including at the venotomy incision. Dressings were placed. The patient tolerated the procedure well without immediate post procedural complication. IMPRESSION: Successful placement of a RIGHT internal jugular approach power injectable Port-A-Cath. The tip of the catheter is positioned within the proximal RIGHT atrium. The catheter is ready for immediate use. Roanna Banning, MD  [facility] Radiology Electronically Signed   By: Roanna Banning M.D.   On: 09/14/2022 17:18   CT ANGIO GI BLEED  Result Date: 08/31/2022 CLINICAL DATA:  Acute mesenteric ischemia. Abdominal pain. History of myelodysplastic syndrome. EXAM: CTA ABDOMEN AND PELVIS WITHOUT AND WITH CONTRAST TECHNIQUE: Multidetector CT imaging of the abdomen and pelvis was performed using the standard protocol during bolus administration of intravenous contrast. Multiplanar reconstructed images and MIPs were obtained and reviewed to evaluate the vascular anatomy. RADIATION DOSE REDUCTION: This exam was performed according to the departmental dose-optimization program which includes automated exposure control, adjustment of the mA and/or kV according to patient size and/or use of iterative reconstruction technique. CONTRAST:  OMNIPAQUE IOHEXOL 350 MG/ML SOLN COMPARISON:  Abdominal ultrasound examination 04/03/2021 FINDINGS: VASCULAR Aorta: Normal caliber abdominal aorta. Scattered atherosclerotic calcifications. No dissection. Celiac: Normal SMA: Normal Renals: Normal IMA: Patent Inflow: Normal Proximal Outflow: Normal Veins: Normal Review of the MIP images confirms the above findings. NON-VASCULAR Lower chest: Small left pleural effusion and bibasilar atelectasis. The heart is normal in size. No pericardial effusion. There is a moderate to large hiatal hernia. Hepatobiliary: No hepatic lesions or intrahepatic biliary dilatation. The gallbladder is unremarkable. No common bile duct dilatation. Pancreas: No mass, inflammation or ductal dilatation. Spleen: Massive splenomegaly. The spleen measures 20 x  16 x 11 cm. No splenic lesions or splenic infarct. Adrenals/Urinary Tract: The left kidney is displaced medially and inferiorly by the enlarged spleen. No worrisome renal lesions,, hydronephrosis or pyelonephritis. There is a lower pole right renal calculus and there are 2 distal left  ureteral calculi more proximal calculus measures 4 mm and the more distal calculus measures 5 mm. I do not see any hydroureter or hydronephrosis. Stomach/Bowel: The stomach, duodenum, small bowel and colon are grossly normal. No inflammatory changes, mass lesions or obstructive findings. Lymphatic: No abdominal or pelvic lymphadenopathy. Reproductive: The prostate gland is enlarged. The seminal vesicles are unremarkable. Other: No pelvic mass or adenopathy. No free pelvic fluid collections. No inguinal mass or adenopathy. No abdominal wall hernia or subcutaneous lesions. Musculoskeletal: No significant bony findings. IMPRESSION: 1. Normal caliber abdominal aorta and no dissection. The branch vessels are normal. 2. Splenomegaly. 3. Two distal left ureteral calculi but no hydroureter or hydronephrosis. 4. Lower pole right renal calculus. 5. Moderate to large hiatal hernia. 6. Small left pleural effusion and bibasilar atelectasis. Electronically Signed   By: Rudie Meyer M.D.   On: 08/31/2022 18:13     ASSESSMENT & PLAN:    #1 JAK2-positive myeloproliferative neoplasm-primarily presenting with thrombocytosis and associated MDS causing refractory anemia -No polycythemia.  -Mild leukocytosis.  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 Has grade 1 out of 3 reticulin fibrosis.  Uncertain if this is primary or secondary. -LDH has remained within normal limits. 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 -CT Angio GI bleed scan from 09/01/2022 showed kidney stones.and significant splenomegaly  PLAN:  -Discussed lab results from today, 02/21/2023, with the patient. CBC shows decreased hemoglobin at 7.3, decreased hematocrit at 22.6, and elevated platelets at 525 K. CMP shows decreased calcium level at 8.3, decreased total protein level at 5.7, and decreased AST at 12. Patient is anemic.  -patient wants to receive blood transfusion tomorrow and this Thursday.  -Educated  the patient on JAK-2 inhibitor and discussed other treatment options.  -Discussed the option of ultrasound of spleen. Patient agrees to get an ultrasound.  -Answered all of patient's questions in detail.    -Continue blood transfusion as needed every Thursday.  -patient is tolerating his Vidaza treatment well without any new or severe toxicities.  -Patient can proceed with Cycle 7 of Vidaza without any dose modification. Orders reviewde and signed. -Continue monitoring weekly labs and transfuse as needed for hemoglobin less than 7.5. 1 blood unit weekly as needed.    Follow-up: -BM Bx as scheduled on 5/29 @ [facility] -[place] abd in 2 weeks to evaluate splenomegaly -Plz schedule C8 of Vidaza per integrated scheduling -Labs and 1 unit of PRBC weekly x 6   The total time spent in the appointment was 30 minutes* .  All of the patient's questions were answered with apparent satisfaction. The patient knows to call the clinic with any problems, questions or concerns.   Wyvonnia Lora MD MS AAHIVMS [facility] Hematology/Oncology Physician [facility]  .*Total Encounter Time as defined by the Centers for Medicare and Medicaid Services includes, in addition to the face-to-face time of a patient visit (documented in the note above) non-face-to-face time: obtaining and reviewing outside history, ordering and reviewing medications, tests or procedures, care coordination (communications with other health care professionals or caregivers) and documentation in the medical record.   Colonel Bald, am acting as a Neurosurgeon for Wyvonnia Lora, MD.

## 2023-02-21 NOTE — Patient Instructions (Signed)
[facility]  Discharge Instructions: Thank you for choosing [facility] to provide your oncology and hematology care.   If you have a lab appointment with the Cancer Center, please go directly to the Cancer Center and check in at the registration area.   Wear comfortable clothing and clothing appropriate for easy access to any Portacath or PICC line.   We strive to give you quality time with your provider. You may need to reschedule your appointment if you arrive late (15 or more minutes).  Arriving late affects you and other patients whose appointments are after yours.  Also, if you miss three or more appointments without notifying the office, you may be dismissed from the clinic at the provider's discretion.      For prescription refill requests, have your pharmacy contact our office and allow 72 hours for refills to be completed.    Today you received the following chemotherapy and/or immunotherapy agents: Vidaza     To help prevent nausea and vomiting after your treatment, we encourage you to take your nausea medication as directed.  BELOW ARE SYMPTOMS THAT SHOULD BE REPORTED IMMEDIATELY: *FEVER GREATER THAN 100.4 F (38 C) OR HIGHER *CHILLS OR SWEATING *NAUSEA AND VOMITING THAT IS NOT CONTROLLED WITH YOUR NAUSEA MEDICATION *UNUSUAL SHORTNESS OF BREATH *UNUSUAL BRUISING OR BLEEDING *URINARY PROBLEMS (pain or burning when urinating, or frequent urination) *BOWEL PROBLEMS (unusual diarrhea, constipation, pain near the anus) TENDERNESS IN MOUTH AND THROAT WITH OR WITHOUT PRESENCE OF ULCERS (sore throat, sores in mouth, or a toothache) UNUSUAL RASH, SWELLING OR PAIN  UNUSUAL VAGINAL DISCHARGE OR ITCHING   Items with * indicate a potential emergency and should be followed up as soon as possible or go to the Emergency Department if any problems should occur.  Please show the CHEMOTHERAPY ALERT CARD or IMMUNOTHERAPY ALERT CARD at check-in  to the Emergency Department and triage nurse.  Should you have questions after your visit or need to cancel or reschedule your appointment, please contact [facility]  Dept: [phone]  and follow the prompts.  Office hours are 8:00 a.m. to 4:30 p.m. Monday - Friday. Please note that voicemails left after 4:00 p.m. may not be returned until the following business day.  We are closed weekends and major holidays. You have access to a nurse at all times for urgent questions. Please call the main number to the clinic Dept: [phone] and follow the prompts.   For any non-urgent questions, you may also contact your provider using MyChart. We now offer e-Visits for anyone 18 and older to request care online for non-urgent symptoms. For details visit [email or web address].   Also download the MyChart app! Go to the app store, search "MyChart", open the app, select [place], and log in with your MyChart username and password.  

## 2023-02-22 ENCOUNTER — Inpatient Hospital Stay: Payer: Medicare Other

## 2023-02-22 VITALS — BP 98/59 | HR 64 | Temp 97.8°F | Resp 14

## 2023-02-22 DIAGNOSIS — R161 Splenomegaly, not elsewhere classified: Secondary | ICD-10-CM | POA: Diagnosis not present

## 2023-02-22 DIAGNOSIS — D464 Refractory anemia, unspecified: Secondary | ICD-10-CM | POA: Diagnosis not present

## 2023-02-22 DIAGNOSIS — Z86010 Personal history of colonic polyps: Secondary | ICD-10-CM | POA: Diagnosis not present

## 2023-02-22 DIAGNOSIS — Z5112 Encounter for antineoplastic immunotherapy: Secondary | ICD-10-CM | POA: Diagnosis not present

## 2023-02-22 DIAGNOSIS — D539 Nutritional anemia, unspecified: Secondary | ICD-10-CM | POA: Diagnosis not present

## 2023-02-22 DIAGNOSIS — K449 Diaphragmatic hernia without obstruction or gangrene: Secondary | ICD-10-CM | POA: Diagnosis not present

## 2023-02-22 DIAGNOSIS — D649 Anemia, unspecified: Secondary | ICD-10-CM

## 2023-02-22 DIAGNOSIS — M25472 Effusion, left ankle: Secondary | ICD-10-CM | POA: Diagnosis not present

## 2023-02-22 DIAGNOSIS — D469 Myelodysplastic syndrome, unspecified: Secondary | ICD-10-CM

## 2023-02-22 DIAGNOSIS — I7090 Unspecified atherosclerosis: Secondary | ICD-10-CM | POA: Diagnosis not present

## 2023-02-22 DIAGNOSIS — J90 Pleural effusion, not elsewhere classified: Secondary | ICD-10-CM | POA: Diagnosis not present

## 2023-02-22 DIAGNOSIS — E785 Hyperlipidemia, unspecified: Secondary | ICD-10-CM | POA: Diagnosis not present

## 2023-02-22 DIAGNOSIS — Z7982 Long term (current) use of aspirin: Secondary | ICD-10-CM | POA: Diagnosis not present

## 2023-02-22 DIAGNOSIS — D72829 Elevated white blood cell count, unspecified: Secondary | ICD-10-CM | POA: Diagnosis not present

## 2023-02-22 DIAGNOSIS — Z87891 Personal history of nicotine dependence: Secondary | ICD-10-CM | POA: Diagnosis not present

## 2023-02-22 DIAGNOSIS — M1711 Unilateral primary osteoarthritis, right knee: Secondary | ICD-10-CM | POA: Diagnosis not present

## 2023-02-22 DIAGNOSIS — K219 Gastro-esophageal reflux disease without esophagitis: Secondary | ICD-10-CM | POA: Diagnosis not present

## 2023-02-22 DIAGNOSIS — M109 Gout, unspecified: Secondary | ICD-10-CM | POA: Diagnosis not present

## 2023-02-22 DIAGNOSIS — N202 Calculus of kidney with calculus of ureter: Secondary | ICD-10-CM | POA: Diagnosis not present

## 2023-02-22 MED ORDER — SODIUM CHLORIDE 0.9 % IV SOLN
75.0000 mg/m2 | Freq: Once | INTRAVENOUS | Status: AC
  Administered 2023-02-22: 141 mg via INTRAVENOUS
  Filled 2023-02-22: qty 14.1

## 2023-02-22 MED ORDER — SODIUM CHLORIDE 0.9 % IV SOLN: Freq: Once | INTRAVENOUS | Status: AC

## 2023-02-22 MED ORDER — HEPARIN SOD (PORK) LOCK FLUSH 100 UNIT/ML IV SOLN
500.0000 [IU] | Freq: Once | INTRAVENOUS | Status: AC | PRN
  Administered 2023-02-22: 500 [IU]

## 2023-02-22 MED ORDER — SODIUM CHLORIDE 0.9% FLUSH
10.0000 mL | INTRAVENOUS | Status: DC | PRN
  Administered 2023-02-22: 10 mL

## 2023-02-22 MED ORDER — ONDANSETRON HCL 8 MG PO TABS: 8.0000 mg | ORAL_TABLET | Freq: Once | ORAL | Status: DC

## 2023-02-22 NOTE — Patient Instructions (Signed)
[facility]  Discharge Instructions: Thank you for choosing [facility] to provide your oncology and hematology care.   If you have a lab appointment with the Cancer Center, please go directly to the Cancer Center and check in at the registration area.   Wear comfortable clothing and clothing appropriate for easy access to any Portacath or PICC line.   We strive to give you quality time with your provider. You may need to reschedule your appointment if you arrive late (15 or more minutes).  Arriving late affects you and other patients whose appointments are after yours.  Also, if you miss three or more appointments without notifying the office, you may be dismissed from the clinic at the provider's discretion.      For prescription refill requests, have your pharmacy contact our office and allow 72 hours for refills to be completed.    Today you received the following chemotherapy and/or immunotherapy agents: Vidaza     To help prevent nausea and vomiting after your treatment, we encourage you to take your nausea medication as directed.  BELOW ARE SYMPTOMS THAT SHOULD BE REPORTED IMMEDIATELY: *FEVER GREATER THAN 100.4 F (38 C) OR HIGHER *CHILLS OR SWEATING *NAUSEA AND VOMITING THAT IS NOT CONTROLLED WITH YOUR NAUSEA MEDICATION *UNUSUAL SHORTNESS OF BREATH *UNUSUAL BRUISING OR BLEEDING *URINARY PROBLEMS (pain or burning when urinating, or frequent urination) *BOWEL PROBLEMS (unusual diarrhea, constipation, pain near the anus) TENDERNESS IN MOUTH AND THROAT WITH OR WITHOUT PRESENCE OF ULCERS (sore throat, sores in mouth, or a toothache) UNUSUAL RASH, SWELLING OR PAIN  UNUSUAL VAGINAL DISCHARGE OR ITCHING   Items with * indicate a potential emergency and should be followed up as soon as possible or go to the Emergency Department if any problems should occur.  Please show the CHEMOTHERAPY ALERT CARD or IMMUNOTHERAPY ALERT CARD at check-in  to the Emergency Department and triage nurse.  Should you have questions after your visit or need to cancel or reschedule your appointment, please contact [facility]  Dept: [phone]  and follow the prompts.  Office hours are 8:00 a.m. to 4:30 p.m. Monday - Friday. Please note that voicemails left after 4:00 p.m. may not be returned until the following business day.  We are closed weekends and major holidays. You have access to a nurse at all times for urgent questions. Please call the main number to the clinic Dept: [phone] and follow the prompts.   For any non-urgent questions, you may also contact your provider using MyChart. We now offer e-Visits for anyone 18 and older to request care online for non-urgent symptoms. For details visit [email or web address].   Also download the MyChart app! Go to the app store, search "MyChart", open the app, select [place], and log in with your MyChart username and password.  

## 2023-02-23 ENCOUNTER — Inpatient Hospital Stay: Payer: Medicare Other

## 2023-02-23 ENCOUNTER — Other Ambulatory Visit: Payer: Self-pay

## 2023-02-23 VITALS — BP 98/59 | HR 60 | Temp 98.0°F | Resp 16

## 2023-02-23 DIAGNOSIS — D464 Refractory anemia, unspecified: Secondary | ICD-10-CM | POA: Diagnosis not present

## 2023-02-23 DIAGNOSIS — E785 Hyperlipidemia, unspecified: Secondary | ICD-10-CM | POA: Diagnosis not present

## 2023-02-23 DIAGNOSIS — D469 Myelodysplastic syndrome, unspecified: Secondary | ICD-10-CM

## 2023-02-23 DIAGNOSIS — K219 Gastro-esophageal reflux disease without esophagitis: Secondary | ICD-10-CM | POA: Diagnosis not present

## 2023-02-23 DIAGNOSIS — K449 Diaphragmatic hernia without obstruction or gangrene: Secondary | ICD-10-CM | POA: Diagnosis not present

## 2023-02-23 DIAGNOSIS — J90 Pleural effusion, not elsewhere classified: Secondary | ICD-10-CM | POA: Diagnosis not present

## 2023-02-23 DIAGNOSIS — M25472 Effusion, left ankle: Secondary | ICD-10-CM | POA: Diagnosis not present

## 2023-02-23 DIAGNOSIS — I7090 Unspecified atherosclerosis: Secondary | ICD-10-CM | POA: Diagnosis not present

## 2023-02-23 DIAGNOSIS — D72829 Elevated white blood cell count, unspecified: Secondary | ICD-10-CM | POA: Diagnosis not present

## 2023-02-23 DIAGNOSIS — M1711 Unilateral primary osteoarthritis, right knee: Secondary | ICD-10-CM | POA: Diagnosis not present

## 2023-02-23 DIAGNOSIS — Z87891 Personal history of nicotine dependence: Secondary | ICD-10-CM | POA: Diagnosis not present

## 2023-02-23 DIAGNOSIS — Z86010 Personal history of colonic polyps: Secondary | ICD-10-CM | POA: Diagnosis not present

## 2023-02-23 DIAGNOSIS — Z7982 Long term (current) use of aspirin: Secondary | ICD-10-CM | POA: Diagnosis not present

## 2023-02-23 DIAGNOSIS — D649 Anemia, unspecified: Secondary | ICD-10-CM

## 2023-02-23 DIAGNOSIS — Z5112 Encounter for antineoplastic immunotherapy: Secondary | ICD-10-CM | POA: Diagnosis not present

## 2023-02-23 DIAGNOSIS — N202 Calculus of kidney with calculus of ureter: Secondary | ICD-10-CM | POA: Diagnosis not present

## 2023-02-23 DIAGNOSIS — D539 Nutritional anemia, unspecified: Secondary | ICD-10-CM | POA: Diagnosis not present

## 2023-02-23 DIAGNOSIS — M109 Gout, unspecified: Secondary | ICD-10-CM | POA: Diagnosis not present

## 2023-02-23 DIAGNOSIS — R161 Splenomegaly, not elsewhere classified: Secondary | ICD-10-CM | POA: Diagnosis not present

## 2023-02-23 LAB — BPAM RBC: Blood Product Expiration Date: 202405292359

## 2023-02-23 LAB — TYPE AND SCREEN: ABO/RH(D): A POS

## 2023-02-23 MED ORDER — SODIUM CHLORIDE 0.9% FLUSH
10.0000 mL | INTRAVENOUS | Status: DC | PRN
  Administered 2023-02-23: 10 mL

## 2023-02-23 MED ORDER — SODIUM CHLORIDE 0.9% IV SOLUTION
250.0000 mL | Freq: Once | INTRAVENOUS | Status: AC
  Administered 2023-02-23: 250 mL via INTRAVENOUS

## 2023-02-23 MED ORDER — ACETAMINOPHEN 325 MG PO TABS
650.0000 mg | ORAL_TABLET | Freq: Once | ORAL | Status: AC
  Administered 2023-02-23: 650 mg via ORAL
  Filled 2023-02-23: qty 2

## 2023-02-23 MED ORDER — SODIUM CHLORIDE 0.9 % IV SOLN: Freq: Once | INTRAVENOUS | Status: AC

## 2023-02-23 MED ORDER — SODIUM CHLORIDE 0.9 % IV SOLN
75.0000 mg/m2 | Freq: Once | INTRAVENOUS | Status: AC
  Administered 2023-02-23: 141 mg via INTRAVENOUS
  Filled 2023-02-23: qty 14.1

## 2023-02-23 MED ORDER — METHYLPREDNISOLONE SODIUM SUCC 40 MG IJ SOLR
40.0000 mg | Freq: Once | INTRAMUSCULAR | Status: AC
  Administered 2023-02-23: 40 mg via INTRAVENOUS
  Filled 2023-02-23: qty 1

## 2023-02-23 MED ORDER — HEPARIN SOD (PORK) LOCK FLUSH 100 UNIT/ML IV SOLN
500.0000 [IU] | Freq: Once | INTRAVENOUS | Status: AC | PRN
  Administered 2023-02-23: 500 [IU]

## 2023-02-23 MED ORDER — ONDANSETRON HCL 8 MG PO TABS: 8.0000 mg | ORAL_TABLET | Freq: Once | ORAL | Status: DC

## 2023-02-23 NOTE — Patient Instructions (Signed)
[facility]  Discharge Instructions: Thank you for choosing [facility] to provide your oncology and hematology care.   If you have a lab appointment with the Cancer Center, please go directly to the Cancer Center and check in at the registration area.   Wear comfortable clothing and clothing appropriate for easy access to any Portacath or PICC line.   We strive to give you quality time with your provider. You may need to reschedule your appointment if you arrive late (15 or more minutes).  Arriving late affects you and other patients whose appointments are after yours.  Also, if you miss three or more appointments without notifying the office, you may be dismissed from the clinic at the provider's discretion.      For prescription refill requests, have your pharmacy contact our office and allow 72 hours for refills to be completed.    Today you received the following chemotherapy and/or immunotherapy agents vidaza       To help prevent nausea and vomiting after your treatment, we encourage you to take your nausea medication as directed.  BELOW ARE SYMPTOMS THAT SHOULD BE REPORTED IMMEDIATELY: *FEVER GREATER THAN 100.4 F (38 C) OR HIGHER *CHILLS OR SWEATING *NAUSEA AND VOMITING THAT IS NOT CONTROLLED WITH YOUR NAUSEA MEDICATION *UNUSUAL SHORTNESS OF BREATH *UNUSUAL BRUISING OR BLEEDING *URINARY PROBLEMS (pain or burning when urinating, or frequent urination) *BOWEL PROBLEMS (unusual diarrhea, constipation, pain near the anus) TENDERNESS IN MOUTH AND THROAT WITH OR WITHOUT PRESENCE OF ULCERS (sore throat, sores in mouth, or a toothache) UNUSUAL RASH, SWELLING OR PAIN  UNUSUAL VAGINAL DISCHARGE OR ITCHING   Items with * indicate a potential emergency and should be followed up as soon as possible or go to the Emergency Department if any problems should occur.  Please show the CHEMOTHERAPY ALERT CARD or IMMUNOTHERAPY ALERT CARD at  check-in to the Emergency Department and triage nurse.  Should you have questions after your visit or need to cancel or reschedule your appointment, please contact [facility]  Dept: [phone]  and follow the prompts.  Office hours are 8:00 a.m. to 4:30 p.m. Monday - Friday. Please note that voicemails left after 4:00 p.m. may not be returned until the following business day.  We are closed weekends and major holidays. You have access to a nurse at all times for urgent questions. Please call the main number to the clinic Dept: [phone] and follow the prompts.   For any non-urgent questions, you may also contact your provider using MyChart. We now offer e-Visits for anyone 18 and older to request care online for non-urgent symptoms. For details visit [email or web address].   Also download the MyChart app! Go to the app store, search "MyChart", open the app, select [facility], and log in with your MyChart username and password.   

## 2023-02-24 ENCOUNTER — Other Ambulatory Visit: Payer: Medicare Other

## 2023-02-24 ENCOUNTER — Other Ambulatory Visit: Payer: Self-pay

## 2023-02-24 ENCOUNTER — Inpatient Hospital Stay: Payer: Medicare Other

## 2023-02-24 VITALS — BP 97/62 | HR 62 | Temp 97.9°F | Resp 18

## 2023-02-24 DIAGNOSIS — M25472 Effusion, left ankle: Secondary | ICD-10-CM | POA: Diagnosis not present

## 2023-02-24 DIAGNOSIS — K449 Diaphragmatic hernia without obstruction or gangrene: Secondary | ICD-10-CM | POA: Diagnosis not present

## 2023-02-24 DIAGNOSIS — M1711 Unilateral primary osteoarthritis, right knee: Secondary | ICD-10-CM | POA: Diagnosis not present

## 2023-02-24 DIAGNOSIS — D72829 Elevated white blood cell count, unspecified: Secondary | ICD-10-CM | POA: Diagnosis not present

## 2023-02-24 DIAGNOSIS — M109 Gout, unspecified: Secondary | ICD-10-CM | POA: Diagnosis not present

## 2023-02-24 DIAGNOSIS — I7090 Unspecified atherosclerosis: Secondary | ICD-10-CM | POA: Diagnosis not present

## 2023-02-24 DIAGNOSIS — R161 Splenomegaly, not elsewhere classified: Secondary | ICD-10-CM | POA: Diagnosis not present

## 2023-02-24 DIAGNOSIS — Z87891 Personal history of nicotine dependence: Secondary | ICD-10-CM | POA: Diagnosis not present

## 2023-02-24 DIAGNOSIS — Z86010 Personal history of colonic polyps: Secondary | ICD-10-CM | POA: Diagnosis not present

## 2023-02-24 DIAGNOSIS — Z7982 Long term (current) use of aspirin: Secondary | ICD-10-CM | POA: Diagnosis not present

## 2023-02-24 DIAGNOSIS — D464 Refractory anemia, unspecified: Secondary | ICD-10-CM | POA: Diagnosis not present

## 2023-02-24 DIAGNOSIS — J90 Pleural effusion, not elsewhere classified: Secondary | ICD-10-CM | POA: Diagnosis not present

## 2023-02-24 DIAGNOSIS — D539 Nutritional anemia, unspecified: Secondary | ICD-10-CM | POA: Diagnosis not present

## 2023-02-24 DIAGNOSIS — D469 Myelodysplastic syndrome, unspecified: Secondary | ICD-10-CM

## 2023-02-24 DIAGNOSIS — N202 Calculus of kidney with calculus of ureter: Secondary | ICD-10-CM | POA: Diagnosis not present

## 2023-02-24 DIAGNOSIS — K219 Gastro-esophageal reflux disease without esophagitis: Secondary | ICD-10-CM | POA: Diagnosis not present

## 2023-02-24 DIAGNOSIS — E785 Hyperlipidemia, unspecified: Secondary | ICD-10-CM | POA: Diagnosis not present

## 2023-02-24 DIAGNOSIS — D649 Anemia, unspecified: Secondary | ICD-10-CM

## 2023-02-24 DIAGNOSIS — Z5112 Encounter for antineoplastic immunotherapy: Secondary | ICD-10-CM | POA: Diagnosis not present

## 2023-02-24 LAB — CBC WITH DIFFERENTIAL (CANCER CENTER ONLY)
Abs Immature Granulocytes: 0.37 10*3/uL — ABNORMAL HIGH (ref 0.00–0.07)
Basophils Absolute: 0.2 10*3/uL — ABNORMAL HIGH (ref 0.0–0.1)
Basophils Relative: 4 %
Eosinophils Absolute: 0.6 10*3/uL — ABNORMAL HIGH (ref 0.0–0.5)
Eosinophils Relative: 13 %
HCT: 24.3 % — ABNORMAL LOW (ref 39.0–52.0)
Hemoglobin: 7.9 g/dL — ABNORMAL LOW (ref 13.0–17.0)
Immature Granulocytes: 8 %
Lymphocytes Relative: 13 %
Lymphs Abs: 0.6 10*3/uL — ABNORMAL LOW (ref 0.7–4.0)
MCH: 28 pg (ref 26.0–34.0)
MCHC: 32.5 g/dL (ref 30.0–36.0)
MCV: 86.2 fL (ref 80.0–100.0)
Monocytes Absolute: 0.2 10*3/uL (ref 0.1–1.0)
Monocytes Relative: 5 %
Neutro Abs: 2.6 10*3/uL (ref 1.7–7.7)
Neutrophils Relative %: 57 %
Platelet Count: 469 10*3/uL — ABNORMAL HIGH (ref 150–400)
RBC: 2.82 MIL/uL — ABNORMAL LOW (ref 4.22–5.81)
RDW: 16 % — ABNORMAL HIGH (ref 11.5–15.5)
WBC Count: 4.6 10*3/uL (ref 4.0–10.5)
nRBC: 0 % (ref 0.0–0.2)

## 2023-02-24 LAB — CMP (CANCER CENTER ONLY)
ALT: 26 U/L (ref 0–44)
AST: 16 U/L (ref 15–41)
Albumin: 3.8 g/dL (ref 3.5–5.0)
Alkaline Phosphatase: 68 U/L (ref 38–126)
Anion gap: 5 (ref 5–15)
BUN: 24 mg/dL — ABNORMAL HIGH (ref 8–23)
CO2: 28 mmol/L (ref 22–32)
Calcium: 8.4 mg/dL — ABNORMAL LOW (ref 8.9–10.3)
Chloride: 105 mmol/L (ref 98–111)
Creatinine: 1.05 mg/dL (ref 0.61–1.24)
GFR, Estimated: >60 mL/min (ref >60)
Glucose, Bld: 183 mg/dL — ABNORMAL HIGH (ref 70–99)
Potassium: 4.1 mmol/L (ref 3.5–5.1)
Sodium: 138 mmol/L (ref 135–145)
Total Bilirubin: 0.5 mg/dL (ref 0.3–1.2)
Total Protein: 5.8 g/dL — ABNORMAL LOW (ref 6.5–8.1)

## 2023-02-24 LAB — BPAM RBC: Unit Type and Rh: 6200

## 2023-02-24 LAB — SAMPLE TO BLOOD BANK

## 2023-02-24 LAB — TYPE AND SCREEN

## 2023-02-24 MED ORDER — SODIUM CHLORIDE 0.9% IV SOLUTION: 250.0000 mL | Freq: Once | INTRAVENOUS | Status: DC

## 2023-02-24 MED ORDER — HEPARIN SOD (PORK) LOCK FLUSH 100 UNIT/ML IV SOLN: 250.0000 [IU] | INTRAVENOUS | Status: DC | PRN

## 2023-02-24 MED ORDER — SODIUM CHLORIDE 0.9% FLUSH: 10.0000 mL | INTRAVENOUS | Status: DC | PRN

## 2023-02-24 MED ORDER — HEPARIN SOD (PORK) LOCK FLUSH 100 UNIT/ML IV SOLN: 500.0000 [IU] | Freq: Once | INTRAVENOUS | Status: DC | PRN

## 2023-02-24 MED ORDER — SODIUM CHLORIDE 0.9% FLUSH: 3.0000 mL | INTRAVENOUS | Status: DC | PRN

## 2023-02-24 MED ORDER — SODIUM CHLORIDE 0.9% FLUSH
10.0000 mL | Freq: Once | INTRAVENOUS | Status: AC
  Administered 2023-02-24: 10 mL

## 2023-02-24 MED ORDER — ACETAMINOPHEN 325 MG PO TABS
650.0000 mg | ORAL_TABLET | Freq: Once | ORAL | Status: AC
  Administered 2023-02-24: 650 mg via ORAL
  Filled 2023-02-24: qty 2

## 2023-02-24 MED ORDER — SODIUM CHLORIDE 0.9 % IV SOLN: Freq: Once | INTRAVENOUS | Status: AC

## 2023-02-24 MED ORDER — METHYLPREDNISOLONE SODIUM SUCC 40 MG IJ SOLR
40.0000 mg | Freq: Once | INTRAMUSCULAR | Status: AC
  Administered 2023-02-24: 40 mg via INTRAVENOUS
  Filled 2023-02-24: qty 1

## 2023-02-24 MED ORDER — SODIUM CHLORIDE 0.9 % IV SOLN
75.0000 mg/m2 | Freq: Once | INTRAVENOUS | Status: AC
  Administered 2023-02-24: 141 mg via INTRAVENOUS
  Filled 2023-02-24: qty 14.1

## 2023-02-24 NOTE — Progress Notes (Signed)
Pt states he took Zofran at home prior to arriving for [facility] today.

## 2023-02-24 NOTE — Patient Instructions (Addendum)
Blood Transfusion, Adult, Care After The following information offers guidance on how to care for yourself after your procedure. Your health care provider may also give you more specific instructions. If you have problems or questions, contact your health care provider. What can I expect after the procedure? After the procedure, it is common to have: Bruising and soreness where the IV was inserted. A headache. Follow these instructions at home: IV insertion site care     Follow instructions from your health care provider about how to take care of your IV insertion site. Make sure you: Wash your hands with soap and water for at least 20 seconds before and after you change your bandage (dressing). If soap and water are not available, use hand sanitizer. Change your dressing as told by your health care provider. Check your IV insertion site every day for signs of infection. Check for: Redness, swelling, or pain. Bleeding from the site. Warmth. Pus or a bad smell. General instructions Take over-the-counter and prescription medicines only as told by your health care provider. Rest as told by your health care provider. Return to your normal activities as told by your health care provider. Keep all follow-up visits. Lab tests may need to be done at certain periods to recheck your blood counts. Contact a health care provider if: You have itching or red, swollen areas of skin (hives). You have a fever or chills. You have pain in the head, back, or chest. You feel anxious or you feel weak after doing your normal activities. You have redness, swelling, warmth, or pain around the IV insertion site. You have blood coming from the IV insertion site that does not stop with pressure. You have pus or a bad smell coming from your IV insertion site. If you received your blood transfusion in an outpatient setting, you will be told whom to contact to report any reactions. Get help right away if: You  have symptoms of a serious allergic or immune system reaction, including: Trouble breathing or shortness of breath. Swelling of the face, feeling flushed, or widespread rash. Dark urine or blood in the urine. Fast heartbeat. These symptoms may be an emergency. Get help right away. Call 911. Do not wait to see if the symptoms will go away. Do not drive yourself to the hospital. Summary Bruising and soreness around the IV insertion site are common. Check your IV insertion site every day for signs of infection. Rest as told by your health care provider. Return to your normal activities as told by your health care provider. Get help right away for symptoms of a serious allergic or immune system reaction to the blood transfusion. This information is not intended to replace advice given to you by your health care provider. Make sure you discuss any questions you have with your health care provider. Document Revised: 01/01/2022 Document Reviewed: 01/01/2022 Elsevier Patient Education  2023 Elsevier Inc.  

## 2023-02-25 ENCOUNTER — Inpatient Hospital Stay: Payer: Medicare Other

## 2023-02-25 VITALS — BP 93/61 | HR 63 | Temp 97.8°F | Resp 15

## 2023-02-25 DIAGNOSIS — D649 Anemia, unspecified: Secondary | ICD-10-CM

## 2023-02-25 DIAGNOSIS — N202 Calculus of kidney with calculus of ureter: Secondary | ICD-10-CM | POA: Diagnosis not present

## 2023-02-25 DIAGNOSIS — I7090 Unspecified atherosclerosis: Secondary | ICD-10-CM | POA: Diagnosis not present

## 2023-02-25 DIAGNOSIS — Z86010 Personal history of colonic polyps: Secondary | ICD-10-CM | POA: Diagnosis not present

## 2023-02-25 DIAGNOSIS — J90 Pleural effusion, not elsewhere classified: Secondary | ICD-10-CM | POA: Diagnosis not present

## 2023-02-25 DIAGNOSIS — M109 Gout, unspecified: Secondary | ICD-10-CM | POA: Diagnosis not present

## 2023-02-25 DIAGNOSIS — D469 Myelodysplastic syndrome, unspecified: Secondary | ICD-10-CM

## 2023-02-25 DIAGNOSIS — Z7982 Long term (current) use of aspirin: Secondary | ICD-10-CM | POA: Diagnosis not present

## 2023-02-25 DIAGNOSIS — K449 Diaphragmatic hernia without obstruction or gangrene: Secondary | ICD-10-CM | POA: Diagnosis not present

## 2023-02-25 DIAGNOSIS — E785 Hyperlipidemia, unspecified: Secondary | ICD-10-CM | POA: Diagnosis not present

## 2023-02-25 DIAGNOSIS — M25472 Effusion, left ankle: Secondary | ICD-10-CM | POA: Diagnosis not present

## 2023-02-25 DIAGNOSIS — D539 Nutritional anemia, unspecified: Secondary | ICD-10-CM | POA: Diagnosis not present

## 2023-02-25 DIAGNOSIS — M1711 Unilateral primary osteoarthritis, right knee: Secondary | ICD-10-CM | POA: Diagnosis not present

## 2023-02-25 DIAGNOSIS — R161 Splenomegaly, not elsewhere classified: Secondary | ICD-10-CM | POA: Diagnosis not present

## 2023-02-25 DIAGNOSIS — D464 Refractory anemia, unspecified: Secondary | ICD-10-CM | POA: Diagnosis not present

## 2023-02-25 DIAGNOSIS — K219 Gastro-esophageal reflux disease without esophagitis: Secondary | ICD-10-CM | POA: Diagnosis not present

## 2023-02-25 DIAGNOSIS — Z87891 Personal history of nicotine dependence: Secondary | ICD-10-CM | POA: Diagnosis not present

## 2023-02-25 DIAGNOSIS — Z5112 Encounter for antineoplastic immunotherapy: Secondary | ICD-10-CM | POA: Diagnosis not present

## 2023-02-25 DIAGNOSIS — D72829 Elevated white blood cell count, unspecified: Secondary | ICD-10-CM | POA: Diagnosis not present

## 2023-02-25 LAB — TYPE AND SCREEN
Unit division: 0
Unit division: 0

## 2023-02-25 LAB — BPAM RBC
Blood Product Expiration Date: 202405292359
ISSUE DATE / TIME: 202405091030
Unit Type and Rh: 6200

## 2023-02-25 MED ORDER — SODIUM CHLORIDE 0.9 % IV SOLN: Freq: Once | INTRAVENOUS | Status: AC

## 2023-02-25 MED ORDER — SODIUM CHLORIDE 0.9 % IV SOLN
75.0000 mg/m2 | Freq: Once | INTRAVENOUS | Status: AC
  Administered 2023-02-25: 141 mg via INTRAVENOUS
  Filled 2023-02-25: qty 14.1

## 2023-02-25 MED ORDER — SODIUM CHLORIDE 0.9% FLUSH
10.0000 mL | INTRAVENOUS | Status: DC | PRN
  Administered 2023-02-25: 10 mL

## 2023-02-25 MED ORDER — HEPARIN SOD (PORK) LOCK FLUSH 100 UNIT/ML IV SOLN
500.0000 [IU] | Freq: Once | INTRAVENOUS | Status: AC | PRN
  Administered 2023-02-25: 500 [IU]

## 2023-02-25 MED ORDER — ONDANSETRON HCL 8 MG PO TABS: 8.0000 mg | ORAL_TABLET | Freq: Once | ORAL | Status: DC

## 2023-02-28 ENCOUNTER — Encounter: Payer: Self-pay | Admitting: Hematology

## 2023-03-01 ENCOUNTER — Other Ambulatory Visit: Payer: Self-pay | Admitting: *Deleted

## 2023-03-01 ENCOUNTER — Telehealth: Payer: Self-pay | Admitting: Hematology

## 2023-03-01 DIAGNOSIS — D649 Anemia, unspecified: Secondary | ICD-10-CM

## 2023-03-01 DIAGNOSIS — D469 Myelodysplastic syndrome, unspecified: Secondary | ICD-10-CM

## 2023-03-03 ENCOUNTER — Other Ambulatory Visit: Payer: Self-pay | Admitting: *Deleted

## 2023-03-03 ENCOUNTER — Inpatient Hospital Stay: Payer: Medicare Other

## 2023-03-03 DIAGNOSIS — D469 Myelodysplastic syndrome, unspecified: Secondary | ICD-10-CM

## 2023-03-03 DIAGNOSIS — D649 Anemia, unspecified: Secondary | ICD-10-CM

## 2023-03-03 DIAGNOSIS — Z5112 Encounter for antineoplastic immunotherapy: Secondary | ICD-10-CM | POA: Diagnosis not present

## 2023-03-03 DIAGNOSIS — D539 Nutritional anemia, unspecified: Secondary | ICD-10-CM | POA: Diagnosis not present

## 2023-03-03 DIAGNOSIS — N202 Calculus of kidney with calculus of ureter: Secondary | ICD-10-CM | POA: Diagnosis not present

## 2023-03-03 DIAGNOSIS — K219 Gastro-esophageal reflux disease without esophagitis: Secondary | ICD-10-CM | POA: Diagnosis not present

## 2023-03-03 DIAGNOSIS — I7090 Unspecified atherosclerosis: Secondary | ICD-10-CM | POA: Diagnosis not present

## 2023-03-03 DIAGNOSIS — D72829 Elevated white blood cell count, unspecified: Secondary | ICD-10-CM | POA: Diagnosis not present

## 2023-03-03 DIAGNOSIS — M1711 Unilateral primary osteoarthritis, right knee: Secondary | ICD-10-CM | POA: Diagnosis not present

## 2023-03-03 DIAGNOSIS — Z95828 Presence of other vascular implants and grafts: Secondary | ICD-10-CM

## 2023-03-03 DIAGNOSIS — E785 Hyperlipidemia, unspecified: Secondary | ICD-10-CM | POA: Diagnosis not present

## 2023-03-03 DIAGNOSIS — Z86010 Personal history of colonic polyps: Secondary | ICD-10-CM | POA: Diagnosis not present

## 2023-03-03 DIAGNOSIS — Z87891 Personal history of nicotine dependence: Secondary | ICD-10-CM | POA: Diagnosis not present

## 2023-03-03 DIAGNOSIS — Z7982 Long term (current) use of aspirin: Secondary | ICD-10-CM | POA: Diagnosis not present

## 2023-03-03 DIAGNOSIS — J90 Pleural effusion, not elsewhere classified: Secondary | ICD-10-CM | POA: Diagnosis not present

## 2023-03-03 DIAGNOSIS — M25472 Effusion, left ankle: Secondary | ICD-10-CM | POA: Diagnosis not present

## 2023-03-03 DIAGNOSIS — D464 Refractory anemia, unspecified: Secondary | ICD-10-CM | POA: Diagnosis not present

## 2023-03-03 DIAGNOSIS — K449 Diaphragmatic hernia without obstruction or gangrene: Secondary | ICD-10-CM | POA: Diagnosis not present

## 2023-03-03 DIAGNOSIS — R161 Splenomegaly, not elsewhere classified: Secondary | ICD-10-CM | POA: Diagnosis not present

## 2023-03-03 DIAGNOSIS — M109 Gout, unspecified: Secondary | ICD-10-CM | POA: Diagnosis not present

## 2023-03-03 LAB — CMP (CANCER CENTER ONLY)
ALT: 24 U/L (ref 0–44)
AST: 11 U/L — ABNORMAL LOW (ref 15–41)
Albumin: 3.9 g/dL (ref 3.5–5.0)
Alkaline Phosphatase: 67 U/L (ref 38–126)
Anion gap: 5 (ref 5–15)
BUN: 20 mg/dL (ref 8–23)
CO2: 27 mmol/L (ref 22–32)
Calcium: 8.3 mg/dL — ABNORMAL LOW (ref 8.9–10.3)
Chloride: 106 mmol/L (ref 98–111)
Creatinine: 0.92 mg/dL (ref 0.61–1.24)
GFR, Estimated: >60 mL/min (ref >60)
Glucose, Bld: 133 mg/dL — ABNORMAL HIGH (ref 70–99)
Potassium: 4.4 mmol/L (ref 3.5–5.1)
Sodium: 138 mmol/L (ref 135–145)
Total Bilirubin: 0.7 mg/dL (ref 0.3–1.2)
Total Protein: 5.8 g/dL — ABNORMAL LOW (ref 6.5–8.1)

## 2023-03-03 LAB — CBC WITH DIFFERENTIAL (CANCER CENTER ONLY)
Abs Immature Granulocytes: 0.07 10*3/uL (ref 0.00–0.07)
Basophils Absolute: 0.1 10*3/uL (ref 0.0–0.1)
Basophils Relative: 1 %
Eosinophils Absolute: 0.3 10*3/uL (ref 0.0–0.5)
Eosinophils Relative: 9 %
HCT: 21.9 % — ABNORMAL LOW (ref 39.0–52.0)
Hemoglobin: 7.2 g/dL — ABNORMAL LOW (ref 13.0–17.0)
Immature Granulocytes: 2 %
Lymphocytes Relative: 11 %
Lymphs Abs: 0.4 10*3/uL — ABNORMAL LOW (ref 0.7–4.0)
MCH: 28 pg (ref 26.0–34.0)
MCHC: 32.9 g/dL (ref 30.0–36.0)
MCV: 85.2 fL (ref 80.0–100.0)
Monocytes Absolute: 0.2 10*3/uL (ref 0.1–1.0)
Monocytes Relative: 7 %
Neutro Abs: 2.4 10*3/uL (ref 1.7–7.7)
Neutrophils Relative %: 70 %
Platelet Count: 158 10*3/uL (ref 150–400)
RBC: 2.57 MIL/uL — ABNORMAL LOW (ref 4.22–5.81)
RDW: 15.5 % (ref 11.5–15.5)
WBC Count: 3.5 10*3/uL — ABNORMAL LOW (ref 4.0–10.5)
nRBC: 0 % (ref 0.0–0.2)

## 2023-03-03 LAB — BPAM RBC: Unit Type and Rh: 6200

## 2023-03-03 LAB — SAMPLE TO BLOOD BANK

## 2023-03-03 LAB — TYPE AND SCREEN: Antibody Screen: NEGATIVE

## 2023-03-03 LAB — PREPARE RBC (CROSSMATCH)

## 2023-03-03 MED ORDER — HEPARIN SOD (PORK) LOCK FLUSH 100 UNIT/ML IV SOLN: 500.0000 [IU] | Freq: Every day | INTRAVENOUS | Status: DC | PRN

## 2023-03-03 MED ORDER — ACETAMINOPHEN 325 MG PO TABS
650.0000 mg | ORAL_TABLET | Freq: Once | ORAL | Status: AC
  Administered 2023-03-03: 650 mg via ORAL
  Filled 2023-03-03: qty 2

## 2023-03-03 MED ORDER — SODIUM CHLORIDE 0.9% FLUSH
10.0000 mL | Freq: Once | INTRAVENOUS | Status: AC
  Administered 2023-03-03: 10 mL

## 2023-03-03 MED ORDER — METHYLPREDNISOLONE SODIUM SUCC 40 MG IJ SOLR
40.0000 mg | Freq: Once | INTRAMUSCULAR | Status: AC
  Administered 2023-03-03: 40 mg via INTRAVENOUS
  Filled 2023-03-03: qty 1

## 2023-03-03 MED ORDER — SODIUM CHLORIDE 0.9% FLUSH: 10.0000 mL | INTRAVENOUS | Status: DC | PRN

## 2023-03-03 MED ORDER — SODIUM CHLORIDE 0.9% IV SOLUTION
250.0000 mL | Freq: Once | INTRAVENOUS | Status: AC
  Administered 2023-03-03: 250 mL via INTRAVENOUS

## 2023-03-03 NOTE — Patient Instructions (Signed)
Blood Transfusion, Adult, Care After The following information offers guidance on how to care for yourself after your procedure. Your health care provider may also give you more specific instructions. If you have problems or questions, contact your health care provider. What can I expect after the procedure? After the procedure, it is common to have: Bruising and soreness where the IV was inserted. A headache. Follow these instructions at home: IV insertion site care     Follow instructions from your health care provider about how to take care of your IV insertion site. Make sure you: Wash your hands with soap and water for at least 20 seconds before and after you change your bandage (dressing). If soap and water are not available, use hand sanitizer. Change your dressing as told by your health care provider. Check your IV insertion site every day for signs of infection. Check for: Redness, swelling, or pain. Bleeding from the site. Warmth. Pus or a bad smell. General instructions Take over-the-counter and prescription medicines only as told by your health care provider. Rest as told by your health care provider. Return to your normal activities as told by your health care provider. Keep all follow-up visits. Lab tests may need to be done at certain periods to recheck your blood counts. Contact a health care provider if: You have itching or red, swollen areas of skin (hives). You have a fever or chills. You have pain in the head, back, or chest. You feel anxious or you feel weak after doing your normal activities. You have redness, swelling, warmth, or pain around the IV insertion site. You have blood coming from the IV insertion site that does not stop with pressure. You have pus or a bad smell coming from your IV insertion site. If you received your blood transfusion in an outpatient setting, you will be told whom to contact to report any reactions. Get help right away if: You  have symptoms of a serious allergic or immune system reaction, including: Trouble breathing or shortness of breath. Swelling of the face, feeling flushed, or widespread rash. Dark urine or blood in the urine. Fast heartbeat. These symptoms may be an emergency. Get help right away. Call 911. Do not wait to see if the symptoms will go away. Do not drive yourself to the hospital. Summary Bruising and soreness around the IV insertion site are common. Check your IV insertion site every day for signs of infection. Rest as told by your health care provider. Return to your normal activities as told by your health care provider. Get help right away for symptoms of a serious allergic or immune system reaction to the blood transfusion. This information is not intended to replace advice given to you by your health care provider. Make sure you discuss any questions you have with your health care provider. Document Revised: 01/01/2022 Document Reviewed: 01/01/2022 Elsevier Patient Education  2023 Elsevier Inc.  

## 2023-03-03 NOTE — Progress Notes (Signed)
HGB 7.2 today. To receive 1 unit PRBCs for hgb 7.5 and below. Orders entered and confirmed with [place] in [facility]

## 2023-03-04 LAB — BPAM RBC
Blood Product Expiration Date: 202406052359
ISSUE DATE / TIME: 202405161428

## 2023-03-04 LAB — TYPE AND SCREEN
ABO/RH(D): A POS
Unit division: 0

## 2023-03-10 ENCOUNTER — Other Ambulatory Visit: Payer: Self-pay

## 2023-03-10 ENCOUNTER — Telehealth: Payer: Self-pay

## 2023-03-10 ENCOUNTER — Inpatient Hospital Stay: Payer: Medicare Other

## 2023-03-10 ENCOUNTER — Inpatient Hospital Stay (HOSPITAL_BASED_OUTPATIENT_CLINIC_OR_DEPARTMENT_OTHER): Payer: Medicare Other | Admitting: Physician Assistant

## 2023-03-10 DIAGNOSIS — M10479 Other secondary gout, unspecified ankle and foot: Secondary | ICD-10-CM

## 2023-03-10 DIAGNOSIS — Z5112 Encounter for antineoplastic immunotherapy: Secondary | ICD-10-CM | POA: Diagnosis not present

## 2023-03-10 DIAGNOSIS — D469 Myelodysplastic syndrome, unspecified: Secondary | ICD-10-CM

## 2023-03-10 DIAGNOSIS — D464 Refractory anemia, unspecified: Secondary | ICD-10-CM | POA: Diagnosis not present

## 2023-03-10 DIAGNOSIS — I7090 Unspecified atherosclerosis: Secondary | ICD-10-CM | POA: Diagnosis not present

## 2023-03-10 DIAGNOSIS — R161 Splenomegaly, not elsewhere classified: Secondary | ICD-10-CM | POA: Diagnosis not present

## 2023-03-10 DIAGNOSIS — N202 Calculus of kidney with calculus of ureter: Secondary | ICD-10-CM | POA: Diagnosis not present

## 2023-03-10 DIAGNOSIS — D72829 Elevated white blood cell count, unspecified: Secondary | ICD-10-CM | POA: Diagnosis not present

## 2023-03-10 DIAGNOSIS — D539 Nutritional anemia, unspecified: Secondary | ICD-10-CM | POA: Diagnosis not present

## 2023-03-10 DIAGNOSIS — E785 Hyperlipidemia, unspecified: Secondary | ICD-10-CM | POA: Diagnosis not present

## 2023-03-10 DIAGNOSIS — K449 Diaphragmatic hernia without obstruction or gangrene: Secondary | ICD-10-CM | POA: Diagnosis not present

## 2023-03-10 DIAGNOSIS — M25472 Effusion, left ankle: Secondary | ICD-10-CM | POA: Diagnosis not present

## 2023-03-10 DIAGNOSIS — K219 Gastro-esophageal reflux disease without esophagitis: Secondary | ICD-10-CM | POA: Diagnosis not present

## 2023-03-10 DIAGNOSIS — Z7982 Long term (current) use of aspirin: Secondary | ICD-10-CM | POA: Diagnosis not present

## 2023-03-10 DIAGNOSIS — M1711 Unilateral primary osteoarthritis, right knee: Secondary | ICD-10-CM | POA: Diagnosis not present

## 2023-03-10 DIAGNOSIS — Z86010 Personal history of colonic polyps: Secondary | ICD-10-CM | POA: Diagnosis not present

## 2023-03-10 DIAGNOSIS — Z87891 Personal history of nicotine dependence: Secondary | ICD-10-CM | POA: Diagnosis not present

## 2023-03-10 DIAGNOSIS — D649 Anemia, unspecified: Secondary | ICD-10-CM

## 2023-03-10 DIAGNOSIS — M109 Gout, unspecified: Secondary | ICD-10-CM | POA: Diagnosis not present

## 2023-03-10 DIAGNOSIS — J90 Pleural effusion, not elsewhere classified: Secondary | ICD-10-CM | POA: Diagnosis not present

## 2023-03-10 LAB — TYPE AND SCREEN: ABO/RH(D): A POS

## 2023-03-10 LAB — CBC WITH DIFFERENTIAL (CANCER CENTER ONLY)
Abs Immature Granulocytes: 0.12 10*3/uL — ABNORMAL HIGH (ref 0.00–0.07)
Basophils Absolute: 0.1 10*3/uL (ref 0.0–0.1)
Basophils Relative: 2 %
Eosinophils Absolute: 0.2 10*3/uL (ref 0.0–0.5)
Eosinophils Relative: 6 %
HCT: 19 % — ABNORMAL LOW (ref 39.0–52.0)
Hemoglobin: 6.2 g/dL — CL (ref 13.0–17.0)
Immature Granulocytes: 4 %
Lymphocytes Relative: 9 %
Lymphs Abs: 0.3 10*3/uL — ABNORMAL LOW (ref 0.7–4.0)
MCH: 27.7 pg (ref 26.0–34.0)
MCHC: 32.6 g/dL (ref 30.0–36.0)
MCV: 84.8 fL (ref 80.0–100.0)
Monocytes Absolute: 0.3 10*3/uL (ref 0.1–1.0)
Monocytes Relative: 9 %
Neutro Abs: 2.1 10*3/uL (ref 1.7–7.7)
Neutrophils Relative %: 70 %
Platelet Count: 282 10*3/uL (ref 150–400)
RBC: 2.24 MIL/uL — ABNORMAL LOW (ref 4.22–5.81)
RDW: 15.6 % — ABNORMAL HIGH (ref 11.5–15.5)
WBC Count: 2.9 10*3/uL — ABNORMAL LOW (ref 4.0–10.5)
nRBC: 0 % (ref 0.0–0.2)

## 2023-03-10 LAB — CMP (CANCER CENTER ONLY)
ALT: 14 U/L (ref 0–44)
AST: 9 U/L — ABNORMAL LOW (ref 15–41)
Albumin: 3.8 g/dL (ref 3.5–5.0)
Alkaline Phosphatase: 58 U/L (ref 38–126)
Anion gap: 5 (ref 5–15)
BUN: 15 mg/dL (ref 8–23)
CO2: 26 mmol/L (ref 22–32)
Calcium: 8.1 mg/dL — ABNORMAL LOW (ref 8.9–10.3)
Chloride: 105 mmol/L (ref 98–111)
Creatinine: 0.89 mg/dL (ref 0.61–1.24)
GFR, Estimated: >60 mL/min (ref >60)
Glucose, Bld: 217 mg/dL — ABNORMAL HIGH (ref 70–99)
Potassium: 4.3 mmol/L (ref 3.5–5.1)
Sodium: 136 mmol/L (ref 135–145)
Total Bilirubin: 0.8 mg/dL (ref 0.3–1.2)
Total Protein: 5.5 g/dL — ABNORMAL LOW (ref 6.5–8.1)

## 2023-03-10 LAB — BPAM RBC
Blood Product Expiration Date: 202406192359
ISSUE DATE / TIME: 202405231031
Unit Type and Rh: 6200

## 2023-03-10 LAB — PREPARE RBC (CROSSMATCH)

## 2023-03-10 MED ORDER — PREDNISONE 20 MG PO TABS: 40.0000 mg | ORAL_TABLET | Freq: Every day | ORAL | 0 refills | Status: AC

## 2023-03-10 MED ORDER — ACETAMINOPHEN 325 MG PO TABS
650.0000 mg | ORAL_TABLET | Freq: Once | ORAL | Status: AC
  Administered 2023-03-10: 650 mg via ORAL
  Filled 2023-03-10: qty 2

## 2023-03-10 MED ORDER — SODIUM CHLORIDE 0.9% FLUSH
10.0000 mL | Freq: Once | INTRAVENOUS | Status: AC
  Administered 2023-03-10: 10 mL

## 2023-03-10 MED ORDER — SODIUM CHLORIDE 0.9% FLUSH
3.0000 mL | INTRAVENOUS | Status: AC | PRN
  Administered 2023-03-10: 10 mL

## 2023-03-10 MED ORDER — METHYLPREDNISOLONE SODIUM SUCC 40 MG IJ SOLR
40.0000 mg | Freq: Once | INTRAMUSCULAR | Status: AC
  Administered 2023-03-10: 40 mg via INTRAVENOUS
  Filled 2023-03-10: qty 1

## 2023-03-10 MED ORDER — HEPARIN SOD (PORK) LOCK FLUSH 100 UNIT/ML IV SOLN
250.0000 [IU] | INTRAVENOUS | Status: AC | PRN
  Administered 2023-03-10: 500 [IU]

## 2023-03-10 MED ORDER — SODIUM CHLORIDE 0.9% IV SOLUTION
250.0000 mL | Freq: Once | INTRAVENOUS | Status: AC
  Administered 2023-03-10: 250 mL via INTRAVENOUS

## 2023-03-10 NOTE — Telephone Encounter (Signed)
CRITICAL VALUE STICKER  CRITICAL VALUE:   Hgb  6.2  RECEIVER (on-site recipient of call):Jlyn Bracamonte, LPN   DATE & TIME NOTIFIED: 03/10/23   09:31  MESSENGER (representative from lab):  Lauren  MD NOTIFIED: Wyvonnia Lora, MD  TIME OF NOTIFICATION:  09:34  RESPONSE:

## 2023-03-10 NOTE — Progress Notes (Signed)
Symptom Management Consult Note [facility]    Patient Care Team: Jackelyn Poling, DO as PCP - General (Family Medicine)    Name / MRN / DOB: Fernando Lee  [id number]  03-21-1946   Date of visit: 03/10/2023   Chief Complaint/Reason for visit: toe pain   ASSESSMENT & PLAN: Patient is a 77 y.o. 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 Candise Che.  I have viewed most recent oncology note and lab work.    #JAK2 positive myeloproliferative neoplasm/MDS  - Here for blood tranfusion today. - Next appointment with oncologist is 03/21/23   #Toe pain -HPI and PE suggestive of gout. - Patient has contraindication to NSAIDs as he has had GI bleeding in the past. - Will treat with 5 days of prednisone and encouraged tylenol for pain if needed. He is already feeling better after solu-medrol he received earlier today, so he will start prednisone tomorrow. -Encouraged patient to follow-up with pediatrician if symptoms persist or he has any future episodes.  Patient agreeable with plan of care      Heme/Onc History: Oncology History  MDS (myelodysplastic syndrome) (HCC)  05/26/2022 Initial Diagnosis   MDS (myelodysplastic syndrome) (HCC)   05/31/2022 - 08/11/2022 Chemotherapy   Patient is on Treatment Plan : MYELODYSPLASIA Luspatercept q21d     09/06/2022 -  Chemotherapy   Patient is on Treatment Plan : MYELODYSPLASIA  Azacitidine IV D1-5 q28d         Interval history-: Fernando Lee is a 77 y.o. male with oncologic history as above presenting to [facility] today with chief complaint of left great toe pain x 1 week.  He is accompanied by his spouse who provides additional history.  Patient denies any injury to the toe.  He is endorsing sharp intense pain localized to his left great toe.  Pain has been constant. He tried applying ice without much symptom improvement.  He did not take any over-the-counter medications prior to arrival.   Patient had a blood transfusion prior to his appointment and received Tylenol and Solu-Medrol as premedications. After those medications his pain as significantly improved. He has not had any fevers. He does admit to eating more lunchmeat and salty foods lately which is not typical for him.  He does admit the pain makes it hard for him to bear weight on his toes although he is able to.  He has had associated swelling.  Denies history of similar pain.      ROS  All other systems are reviewed and are negative for acute change except as noted in the HPI.    No Known Allergies   Past Medical History:  Diagnosis Date   Arthritis of right knee    Cervicalgia    Chest discomfort    normal stress echo   Chest pain    Colon polyps    Dyslipidemia    Fluttering sensation of heart    GERD without esophagitis    Hematochezia    Hyperglycemia    Hyperlipidemia    Internal hemorrhoids    Kidney stones    Male erectile dysfunction, unspecified    Mixed dyslipidemia    Mixed hyperlipidemia    Ocular migraine    PAC (premature atrial contraction)    Personal history of colonic polyps    Prediabetes    Residual hemorrhoidal skin tags    Spleen enlarged    nov 2023 hospitalized   Unspecified hemorrhoids  Past Surgical History:  Procedure Laterality Date   BONE MARROW BIOPSY     IR IMAGING GUIDED PORT INSERTION  09/14/2022   KIDNEY STONE SURGERY     retrieval    Social History   Socioeconomic History   Marital status: Married    Spouse name: Not on file   Number of children: Not on file   Years of education: Not on file   Highest education level: Not on file  Occupational History   Not on file  Tobacco Use   Smoking status: Former    Types: Cigarettes   Smokeless tobacco: Never  Substance and Sexual Activity   Alcohol use: Yes    Alcohol/week: 1.0 standard drink of alcohol    Types: 1 Cans of beer per week   Drug use: Not on file   Sexual activity: Not on file   Other Topics Concern   Not on file  Social History Narrative   Not on file   Social Determinants of Health   Financial Resource Strain: Not on file  Food Insecurity: Unknown (08/31/2022)   Hunger Vital Sign    Worried About Running Out of Food in the Last Year: Never true    Ran Out of Food in the Last Year: Not on file  Transportation Needs: No Transportation Needs (08/31/2022)   PRAPARE - Administrator, Civil Service (Medical): No    Lack of Transportation (Non-Medical): No  Physical Activity: Not on file  Stress: Not on file  Social Connections: Not on file  Intimate Partner Violence: Not on file    Family History  Problem Relation Age of Onset   Stroke Brother    Congestive Heart Failure Brother      Current Outpatient Medications:    [START ON 03/11/2023] predniSONE (DELTASONE) 20 MG tablet, Take 2 tablets (40 mg total) by mouth daily with breakfast for 5 days., Disp: 10 tablet, Rfl: 0   ALFUZOSIN HCL ER PO, Take 1 tablet by mouth daily after supper., Disp: , Rfl:    aspirin EC 81 MG tablet, Take 81 mg by mouth daily. Swallow whole., Disp: , Rfl:    B Complex Vitamins (B COMPLEX PO), Take 1 tablet by mouth daily., Disp: , Rfl:    Cholecalciferol (VITAMIN D3) 50 MCG (2000 UT) TABS, Take 2,000 Units by mouth daily., Disp: , Rfl:    docusate sodium (COLACE) 100 MG capsule, Take 100 mg by mouth 2 (two) times daily., Disp: , Rfl:    FIBER PO, Take 1 capsule by mouth daily. Unknown strength, Disp: , Rfl:    fish oil-omega-3 fatty acids 1000 MG capsule, Take 1 g by mouth 2 (two) times daily. Strength 300mg /1500mg , Disp: , Rfl:    Flaxseed, Linseed, (FLAXSEED OIL) 1000 MG CAPS, Take 1,000 mg by mouth daily., Disp: , Rfl:    GLUCOSAMINE CHONDROITIN MSM PO, Take 1 tablet by mouth daily. Strength 1500/1500, Disp: , Rfl:    lidocaine-prilocaine (EMLA) cream, Apply to affected area once (Patient taking differently: Apply 1 Application topically once. Apply to affected  area once), Disp: 30 g, Rfl: 3   Misc Natural Products (PROSTATE SUPPORT PO), Take 1 capsule by mouth daily. Unknown strength, Disp: , Rfl:    Multiple Vitamin (MULTIVITAMIN) tablet, Take 1 tablet by mouth daily. Unknown strength, Disp: , Rfl:    ondansetron (ZOFRAN) 8 MG tablet, Take 1 tablet (8 mg total) by mouth every 8 (eight) hours as needed for nausea or vomiting., Disp: 30 tablet,  Rfl: 1   prochlorperazine (COMPAZINE) 10 MG tablet, Take 1 tablet (10 mg total) by mouth every 6 (six) hours as needed for nausea or vomiting., Disp: 30 tablet, Rfl: 1   psyllium (METAMUCIL) 58.6 % powder, Take 1 packet by mouth daily., Disp: , Rfl:    senna-docusate (SENNA S) 8.6-50 MG tablet, Take 2 tablets by mouth at bedtime., Disp: 60 tablet, Rfl: 1   traMADol (ULTRAM) 50 MG tablet, Take 50 mg by mouth as needed for moderate pain or severe pain., Disp: , Rfl:    Turmeric (QC TUMERIC COMPLEX PO), Take 750 mg by mouth daily., Disp: , Rfl:    vitamin C (ASCORBIC ACID) 250 MG tablet, Take 500 mg by mouth daily., Disp: , Rfl:   PHYSICAL EXAM: ECOG FS:1 - Symptomatic but completely ambulatory  T: 97.9  BP: 100/66  HR: 72  SpO2: 98% Physical Exam Vitals and nursing note reviewed.  Constitutional:      Appearance: He is not ill-appearing or toxic-appearing.  HENT:     Head: Normocephalic.  Eyes:     Conjunctiva/sclera: Conjunctivae normal.  Pulmonary:     Effort: Pulmonary effort is normal.  Musculoskeletal:     Cervical back: Normal range of motion.  Feet:     Comments: Able to wiggle all toes on left foot.  No bony tenderness.  Minimal swelling to left great toe.  No deformity.  DP pulse 2+. No open wounds. Skin:    General: Skin is warm and dry.  Neurological:     Mental Status: He is alert.        LABORATORY DATA: I have reviewed the data as listed    Latest Ref Rng & Units 03/10/2023    9:11 AM 03/03/2023   12:30 PM 02/24/2023    9:10 AM  CBC  WBC 4.0 - 10.5 K/uL 2.9  3.5  4.6    Hemoglobin 13.0 - 17.0 g/dL 6.2  7.2  7.9   Hematocrit 39.0 - 52.0 % 19.0  21.9  24.3   Platelets 150 - 400 K/uL 282  158  469         Latest Ref Rng & Units 03/10/2023    9:11 AM 03/03/2023   12:30 PM 02/24/2023    9:10 AM  CMP  Glucose 70 - 99 mg/dL 409  811  914   BUN 8 - 23 mg/dL 15  20  24    Creatinine 0.61 - 1.24 mg/dL 7.82  9.56  2.13   Sodium 135 - 145 mmol/L 136  138  138   Potassium 3.5 - 5.1 mmol/L 4.3  4.4  4.1   Chloride 98 - 111 mmol/L 105  106  105   CO2 22 - 32 mmol/L 26  27  28    Calcium 8.9 - 10.3 mg/dL 8.1  8.3  8.4   Total Protein 6.5 - 8.1 g/dL 5.5  5.8  5.8   Total Bilirubin 0.3 - 1.2 mg/dL 0.8  0.7  0.5   Alkaline Phos 38 - 126 U/L 58  67  68   AST 15 - 41 U/L 9  11  16    ALT 0 - 44 U/L 14  24  26         RADIOGRAPHIC STUDIES (from last 24 hours if applicable) I have personally reviewed the radiological images as listed and agreed with the findings in the report. No results found.      Visit Diagnosis: 1. MDS (myelodysplastic syndrome) (HCC)   2.  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   All questions were answered. The patient knows to call the clinic with any problems, questions or concerns. No barriers to learning was detected.  A total of more than 30 minutes were spent on this encounter with face-to-face time and non-face-to-face time, including preparing to see the patient, ordering medications, counseling the patient and coordination of care as outlined above.    Thank you for allowing me to participate in the care of this patient.    Shanon Ace, PA-C Department of Hematology/Oncology [facility] Phone: [phone]  [phone]    03/10/2023 4:34 PM

## 2023-03-11 LAB — TYPE AND SCREEN
Antibody Screen: NEGATIVE
Unit division: 0
Unit division: 0

## 2023-03-11 LAB — BPAM RBC
Blood Product Expiration Date: 202406192359
ISSUE DATE / TIME: 202405231031
Unit Type and Rh: 6200

## 2023-03-16 DIAGNOSIS — D469 Myelodysplastic syndrome, unspecified: Secondary | ICD-10-CM | POA: Diagnosis not present

## 2023-03-17 ENCOUNTER — Other Ambulatory Visit: Payer: Self-pay

## 2023-03-17 ENCOUNTER — Inpatient Hospital Stay: Payer: Medicare Other

## 2023-03-17 DIAGNOSIS — D649 Anemia, unspecified: Secondary | ICD-10-CM

## 2023-03-17 DIAGNOSIS — M1711 Unilateral primary osteoarthritis, right knee: Secondary | ICD-10-CM | POA: Diagnosis not present

## 2023-03-17 DIAGNOSIS — N202 Calculus of kidney with calculus of ureter: Secondary | ICD-10-CM | POA: Diagnosis not present

## 2023-03-17 DIAGNOSIS — D469 Myelodysplastic syndrome, unspecified: Secondary | ICD-10-CM

## 2023-03-17 DIAGNOSIS — M109 Gout, unspecified: Secondary | ICD-10-CM | POA: Diagnosis not present

## 2023-03-17 DIAGNOSIS — Z87891 Personal history of nicotine dependence: Secondary | ICD-10-CM | POA: Diagnosis not present

## 2023-03-17 DIAGNOSIS — I7090 Unspecified atherosclerosis: Secondary | ICD-10-CM | POA: Diagnosis not present

## 2023-03-17 DIAGNOSIS — D464 Refractory anemia, unspecified: Secondary | ICD-10-CM | POA: Diagnosis not present

## 2023-03-17 DIAGNOSIS — D72829 Elevated white blood cell count, unspecified: Secondary | ICD-10-CM | POA: Diagnosis not present

## 2023-03-17 DIAGNOSIS — M25472 Effusion, left ankle: Secondary | ICD-10-CM | POA: Diagnosis not present

## 2023-03-17 DIAGNOSIS — D539 Nutritional anemia, unspecified: Secondary | ICD-10-CM | POA: Diagnosis not present

## 2023-03-17 DIAGNOSIS — J90 Pleural effusion, not elsewhere classified: Secondary | ICD-10-CM | POA: Diagnosis not present

## 2023-03-17 DIAGNOSIS — Z7982 Long term (current) use of aspirin: Secondary | ICD-10-CM | POA: Diagnosis not present

## 2023-03-17 DIAGNOSIS — Z86010 Personal history of colonic polyps: Secondary | ICD-10-CM | POA: Diagnosis not present

## 2023-03-17 DIAGNOSIS — E785 Hyperlipidemia, unspecified: Secondary | ICD-10-CM | POA: Diagnosis not present

## 2023-03-17 DIAGNOSIS — Z5112 Encounter for antineoplastic immunotherapy: Secondary | ICD-10-CM | POA: Diagnosis not present

## 2023-03-17 DIAGNOSIS — K219 Gastro-esophageal reflux disease without esophagitis: Secondary | ICD-10-CM | POA: Diagnosis not present

## 2023-03-17 DIAGNOSIS — R161 Splenomegaly, not elsewhere classified: Secondary | ICD-10-CM | POA: Diagnosis not present

## 2023-03-17 DIAGNOSIS — K449 Diaphragmatic hernia without obstruction or gangrene: Secondary | ICD-10-CM | POA: Diagnosis not present

## 2023-03-17 LAB — CBC WITH DIFFERENTIAL (CANCER CENTER ONLY)
Abs Immature Granulocytes: 0.27 10*3/uL — ABNORMAL HIGH (ref 0.00–0.07)
Abs Immature Granulocytes: 0.24 10*3/uL — ABNORMAL HIGH (ref 0.00–0.07)
Basophils Absolute: 0.3 10*3/uL — ABNORMAL HIGH (ref 0.0–0.1)
Basophils Absolute: 0.3 10*3/uL — ABNORMAL HIGH (ref 0.0–0.1)
Basophils Relative: 7 %
Basophils Relative: 6 %
Eosinophils Absolute: 0.3 10*3/uL (ref 0.0–0.5)
Eosinophils Absolute: 0.2 10*3/uL (ref 0.0–0.5)
Eosinophils Relative: 6 %
Eosinophils Relative: 5 %
HCT: 24.4 % — ABNORMAL LOW (ref 39.0–52.0)
HCT: 23 % — ABNORMAL LOW (ref 39.0–52.0)
Hemoglobin: 8 g/dL — ABNORMAL LOW (ref 13.0–17.0)
Hemoglobin: 7.7 g/dL — ABNORMAL LOW (ref 13.0–17.0)
Immature Granulocytes: 6 %
Immature Granulocytes: 6 %
Lymphocytes Relative: 10 %
Lymphocytes Relative: 10 %
Lymphs Abs: 0.5 10*3/uL — ABNORMAL LOW (ref 0.7–4.0)
Lymphs Abs: 0.4 10*3/uL — ABNORMAL LOW (ref 0.7–4.0)
MCH: 28.4 pg (ref 26.0–34.0)
MCH: 28.7 pg (ref 26.0–34.0)
MCHC: 32.8 g/dL (ref 30.0–36.0)
MCHC: 33.5 g/dL (ref 30.0–36.0)
MCV: 86.5 fL (ref 80.0–100.0)
MCV: 85.8 fL (ref 80.0–100.0)
Monocytes Absolute: 0.4 10*3/uL (ref 0.1–1.0)
Monocytes Absolute: 0.4 10*3/uL (ref 0.1–1.0)
Monocytes Relative: 8 %
Monocytes Relative: 9 %
Neutro Abs: 2.8 10*3/uL (ref 1.7–7.7)
Neutro Abs: 2.8 10*3/uL (ref 1.7–7.7)
Neutrophils Relative %: 63 %
Neutrophils Relative %: 64 %
Platelet Count: 499 10*3/uL — ABNORMAL HIGH (ref 150–400)
Platelet Count: 515 10*3/uL — ABNORMAL HIGH (ref 150–400)
RBC: 2.82 MIL/uL — ABNORMAL LOW (ref 4.22–5.81)
RBC: 2.68 MIL/uL — ABNORMAL LOW (ref 4.22–5.81)
RDW: 15.6 % — ABNORMAL HIGH (ref 11.5–15.5)
RDW: 15.8 % — ABNORMAL HIGH (ref 11.5–15.5)
Smear Review: NORMAL
Smear Review: NORMAL
WBC Count: 4.5 10*3/uL (ref 4.0–10.5)
WBC Count: 4.4 10*3/uL (ref 4.0–10.5)
nRBC: 0 % (ref 0.0–0.2)
nRBC: 0 % (ref 0.0–0.2)

## 2023-03-17 LAB — BPAM RBC

## 2023-03-17 LAB — TYPE AND SCREEN: Unit division: 0

## 2023-03-17 LAB — SAMPLE TO BLOOD BANK

## 2023-03-17 LAB — PREPARE RBC (CROSSMATCH)

## 2023-03-17 MED ORDER — ACETAMINOPHEN 325 MG PO TABS
650.0000 mg | ORAL_TABLET | Freq: Once | ORAL | Status: AC
  Administered 2023-03-17: 650 mg via ORAL
  Filled 2023-03-17: qty 2

## 2023-03-17 MED ORDER — METHYLPREDNISOLONE SODIUM SUCC 40 MG IJ SOLR
40.0000 mg | Freq: Once | INTRAMUSCULAR | Status: AC
  Administered 2023-03-17: 40 mg via INTRAVENOUS
  Filled 2023-03-17: qty 1

## 2023-03-17 MED ORDER — HEPARIN SOD (PORK) LOCK FLUSH 100 UNIT/ML IV SOLN: 250.0000 [IU] | INTRAVENOUS | Status: DC | PRN

## 2023-03-17 MED ORDER — SODIUM CHLORIDE 0.9% FLUSH
3.0000 mL | INTRAVENOUS | Status: AC | PRN
  Administered 2023-03-17: 3 mL

## 2023-03-17 MED ORDER — SODIUM CHLORIDE 0.9% IV SOLUTION
250.0000 mL | Freq: Once | INTRAVENOUS | Status: AC
  Administered 2023-03-17: 250 mL via INTRAVENOUS

## 2023-03-17 MED ORDER — HEPARIN SOD (PORK) LOCK FLUSH 100 UNIT/ML IV SOLN
500.0000 [IU] | Freq: Every day | INTRAVENOUS | Status: AC | PRN
  Administered 2023-03-17: 500 [IU]

## 2023-03-17 MED ORDER — SODIUM CHLORIDE 0.9% FLUSH
10.0000 mL | Freq: Once | INTRAVENOUS | Status: AC
  Administered 2023-03-17: 10 mL

## 2023-03-17 NOTE — Progress Notes (Signed)
Pt. Hemoglobin 7.7 g/dL and per Dr. Candise Che ok for 1 unit of packed red blood cells today.

## 2023-03-17 NOTE — Patient Instructions (Signed)
Blood Transfusion, Adult, Care After The following information offers guidance on how to care for yourself after your procedure. Your health care provider may also give you more specific instructions. If you have problems or questions, contact your health care provider. What can I expect after the procedure? After the procedure, it is common to have: Bruising and soreness where the IV was inserted. A headache. Follow these instructions at home: IV insertion site care     Follow instructions from your health care provider about how to take care of your IV insertion site. Make sure you: Wash your hands with soap and water for at least 20 seconds before and after you change your bandage (dressing). If soap and water are not available, use hand sanitizer. Change your dressing as told by your health care provider. Check your IV insertion site every day for signs of infection. Check for: Redness, swelling, or pain. Bleeding from the site. Warmth. Pus or a bad smell. General instructions Take over-the-counter and prescription medicines only as told by your health care provider. Rest as told by your health care provider. Return to your normal activities as told by your health care provider. Keep all follow-up visits. Lab tests may need to be done at certain periods to recheck your blood counts. Contact a health care provider if: You have itching or red, swollen areas of skin (hives). You have a fever or chills. You have pain in the head, back, or chest. You feel anxious or you feel weak after doing your normal activities. You have redness, swelling, warmth, or pain around the IV insertion site. You have blood coming from the IV insertion site that does not stop with pressure. You have pus or a bad smell coming from your IV insertion site. If you received your blood transfusion in an outpatient setting, you will be told whom to contact to report any reactions. Get help right away if: You  have symptoms of a serious allergic or immune system reaction, including: Trouble breathing or shortness of breath. Swelling of the face, feeling flushed, or widespread rash. Dark urine or blood in the urine. Fast heartbeat. These symptoms may be an emergency. Get help right away. Call 911. Do not wait to see if the symptoms will go away. Do not drive yourself to the hospital. Summary Bruising and soreness around the IV insertion site are common. Check your IV insertion site every day for signs of infection. Rest as told by your health care provider. Return to your normal activities as told by your health care provider. Get help right away for symptoms of a serious allergic or immune system reaction to the blood transfusion. This information is not intended to replace advice given to you by your health care provider. Make sure you discuss any questions you have with your health care provider. Document Revised: 01/01/2022 Document Reviewed: 01/01/2022 Elsevier Patient Education  2024 Elsevier Inc.  

## 2023-03-18 ENCOUNTER — Ambulatory Visit (HOSPITAL_COMMUNITY)
Admission: RE | Admit: 2023-03-18 | Discharge: 2023-03-18 | Disposition: A | Discharge status: Home / Self Care | Payer: Medicare Other | Source: Ambulatory Visit | Attending: Hematology | Admitting: Hematology

## 2023-03-18 DIAGNOSIS — D469 Myelodysplastic syndrome, unspecified: Secondary | ICD-10-CM | POA: Diagnosis not present

## 2023-03-18 DIAGNOSIS — D7389 Other diseases of spleen: Secondary | ICD-10-CM | POA: Diagnosis not present

## 2023-03-18 LAB — TYPE AND SCREEN
ABO/RH(D): A POS
Antibody Screen: NEGATIVE

## 2023-03-18 LAB — BPAM RBC
Blood Product Expiration Date: 202406202359
ISSUE DATE / TIME: 202405301107
Unit Type and Rh: 6200

## 2023-03-21 ENCOUNTER — Other Ambulatory Visit: Payer: Self-pay

## 2023-03-21 ENCOUNTER — Inpatient Hospital Stay: Payer: Medicare Other

## 2023-03-21 ENCOUNTER — Inpatient Hospital Stay: Payer: Medicare Other | Attending: Hematology | Admitting: Hematology

## 2023-03-21 DIAGNOSIS — K449 Diaphragmatic hernia without obstruction or gangrene: Secondary | ICD-10-CM | POA: Insufficient documentation

## 2023-03-21 DIAGNOSIS — K219 Gastro-esophageal reflux disease without esophagitis: Secondary | ICD-10-CM | POA: Diagnosis not present

## 2023-03-21 DIAGNOSIS — R5383 Other fatigue: Secondary | ICD-10-CM | POA: Diagnosis not present

## 2023-03-21 DIAGNOSIS — Z86010 Personal history of colonic polyps: Secondary | ICD-10-CM | POA: Insufficient documentation

## 2023-03-21 DIAGNOSIS — D649 Anemia, unspecified: Secondary | ICD-10-CM

## 2023-03-21 DIAGNOSIS — J90 Pleural effusion, not elsewhere classified: Secondary | ICD-10-CM | POA: Insufficient documentation

## 2023-03-21 DIAGNOSIS — R161 Splenomegaly, not elsewhere classified: Secondary | ICD-10-CM | POA: Diagnosis not present

## 2023-03-21 DIAGNOSIS — K828 Other specified diseases of gallbladder: Secondary | ICD-10-CM | POA: Insufficient documentation

## 2023-03-21 DIAGNOSIS — Z79899 Other long term (current) drug therapy: Secondary | ICD-10-CM | POA: Insufficient documentation

## 2023-03-21 DIAGNOSIS — Z87891 Personal history of nicotine dependence: Secondary | ICD-10-CM | POA: Diagnosis not present

## 2023-03-21 DIAGNOSIS — M1711 Unilateral primary osteoarthritis, right knee: Secondary | ICD-10-CM | POA: Insufficient documentation

## 2023-03-21 DIAGNOSIS — D469 Myelodysplastic syndrome, unspecified: Secondary | ICD-10-CM | POA: Diagnosis not present

## 2023-03-21 DIAGNOSIS — D539 Nutritional anemia, unspecified: Secondary | ICD-10-CM | POA: Diagnosis not present

## 2023-03-21 DIAGNOSIS — Z7961 Long term (current) use of immunomodulator: Secondary | ICD-10-CM | POA: Insufficient documentation

## 2023-03-21 DIAGNOSIS — E785 Hyperlipidemia, unspecified: Secondary | ICD-10-CM | POA: Diagnosis not present

## 2023-03-21 DIAGNOSIS — N202 Calculus of kidney with calculus of ureter: Secondary | ICD-10-CM | POA: Diagnosis not present

## 2023-03-21 DIAGNOSIS — R739 Hyperglycemia, unspecified: Secondary | ICD-10-CM | POA: Insufficient documentation

## 2023-03-21 DIAGNOSIS — Z7982 Long term (current) use of aspirin: Secondary | ICD-10-CM | POA: Diagnosis not present

## 2023-03-21 DIAGNOSIS — R61 Generalized hyperhidrosis: Secondary | ICD-10-CM | POA: Insufficient documentation

## 2023-03-21 DIAGNOSIS — N400 Enlarged prostate without lower urinary tract symptoms: Secondary | ICD-10-CM | POA: Diagnosis not present

## 2023-03-21 DIAGNOSIS — Z8719 Personal history of other diseases of the digestive system: Secondary | ICD-10-CM | POA: Diagnosis not present

## 2023-03-21 DIAGNOSIS — D464 Refractory anemia, unspecified: Secondary | ICD-10-CM | POA: Diagnosis not present

## 2023-03-21 DIAGNOSIS — D75839 Thrombocytosis, unspecified: Secondary | ICD-10-CM | POA: Diagnosis not present

## 2023-03-21 DIAGNOSIS — E782 Mixed hyperlipidemia: Secondary | ICD-10-CM | POA: Diagnosis not present

## 2023-03-21 LAB — CBC WITH DIFFERENTIAL (CANCER CENTER ONLY)
Abs Immature Granulocytes: 0.34 10*3/uL — ABNORMAL HIGH (ref 0.00–0.07)
Basophils Absolute: 0.3 10*3/uL — ABNORMAL HIGH (ref 0.0–0.1)
Basophils Relative: 6 %
Eosinophils Absolute: 0.5 10*3/uL (ref 0.0–0.5)
Eosinophils Relative: 11 %
HCT: 23.1 % — ABNORMAL LOW (ref 39.0–52.0)
Hemoglobin: 7.5 g/dL — ABNORMAL LOW (ref 13.0–17.0)
Immature Granulocytes: 7 %
Lymphocytes Relative: 11 %
Lymphs Abs: 0.5 10*3/uL — ABNORMAL LOW (ref 0.7–4.0)
MCH: 28.2 pg (ref 26.0–34.0)
MCHC: 32.5 g/dL (ref 30.0–36.0)
MCV: 86.8 fL (ref 80.0–100.0)
Monocytes Absolute: 0.4 10*3/uL (ref 0.1–1.0)
Monocytes Relative: 9 %
Neutro Abs: 2.6 10*3/uL (ref 1.7–7.7)
Neutrophils Relative %: 56 %
Platelet Count: 497 10*3/uL — ABNORMAL HIGH (ref 150–400)
RBC: 2.66 MIL/uL — ABNORMAL LOW (ref 4.22–5.81)
RDW: 15.9 % — ABNORMAL HIGH (ref 11.5–15.5)
Smear Review: NORMAL
WBC Count: 4.7 10*3/uL (ref 4.0–10.5)
nRBC: 0 % (ref 0.0–0.2)

## 2023-03-21 LAB — CMP (CANCER CENTER ONLY)
ALT: 17 U/L (ref 0–44)
AST: 10 U/L — ABNORMAL LOW (ref 15–41)
Albumin: 3.7 g/dL (ref 3.5–5.0)
Alkaline Phosphatase: 48 U/L (ref 38–126)
Anion gap: 5 (ref 5–15)
BUN: 24 mg/dL — ABNORMAL HIGH (ref 8–23)
CO2: 26 mmol/L (ref 22–32)
Calcium: 8.5 mg/dL — ABNORMAL LOW (ref 8.9–10.3)
Chloride: 106 mmol/L (ref 98–111)
Creatinine: 0.89 mg/dL (ref 0.61–1.24)
GFR, Estimated: >60 mL/min (ref >60)
Glucose, Bld: 175 mg/dL — ABNORMAL HIGH (ref 70–99)
Potassium: 4.5 mmol/L (ref 3.5–5.1)
Sodium: 137 mmol/L (ref 135–145)
Total Bilirubin: 0.7 mg/dL (ref 0.3–1.2)
Total Protein: 5.6 g/dL — ABNORMAL LOW (ref 6.5–8.1)

## 2023-03-21 LAB — SAMPLE TO BLOOD BANK

## 2023-03-21 MED ORDER — HEPARIN SOD (PORK) LOCK FLUSH 100 UNIT/ML IV SOLN
500.0000 [IU] | Freq: Once | INTRAVENOUS | Status: AC
  Administered 2023-03-21: 500 [IU]

## 2023-03-21 MED ORDER — SODIUM CHLORIDE 0.9% FLUSH
10.0000 mL | Freq: Once | INTRAVENOUS | Status: AC
  Administered 2023-03-21: 10 mL

## 2023-03-21 NOTE — Progress Notes (Signed)
HEMATOLOGY/ONCOLOGY PROGRESS NOTE:   Date of Service: 03/21/23  Patient Care Team: Jackelyn Poling, DO as PCP - General (Family Medicine)  CHIEF COMPLAINTS:  Follow-up for continued evaluation and management of JAK2 positive myeloproliferative neoplasm/MDS  INTERVAL HISTORY:  [name] is a 77 y.o. male here for continued evaluation and management of his MPN/MDS with refractory anemia and thrombocytosis. Patient will not receive Vidaza treatment during this visit.   Patient was last seen by me on 02/21/2023 and he complained of weight loss.   Patient is accompanied by his wife during this visit. He reports he has been doing well overall without any new or severe medical concerns since our last visit. He denies any new infection issues, fever, chills, unexpected weight loss, abdominal pain, dizziness, chest pain, back pain. Patient does complain of persistent fatigue, bilateral leg swelling, mild night sweats, and skin changes.   Patient notes he had bone marrow biopsy with Dr. Lowella Bandy on 03/16/2023. Patient reports that Dr. Lowella Bandy is waiting on the bone marrow results before deciding on treatment option. He notes that Dr. Lowella Bandy recommended to discontinue Vidaza treatment and wait on the bone marrow results.   Patient's next transfusion date is on 03/24/2023.   Patient has trip planned to [place], [place], in July. His wife notes they are planning to leave on Tuesday, July 9th. Patient receives his blood transfusion on Thursday and will need transfusion on Monday, July 8th, before his vacation.    MEDICAL HISTORY:  Past Medical History:  Diagnosis Date   Arthritis of right knee    Cervicalgia    Chest discomfort    normal stress echo   Chest pain    Colon polyps    Dyslipidemia    Fluttering sensation of heart    GERD without esophagitis    Hematochezia    Hyperglycemia    Hyperlipidemia    Internal hemorrhoids    Kidney stones    Male erectile dysfunction, unspecified     Mixed dyslipidemia    Mixed hyperlipidemia    Ocular migraine    PAC (premature atrial contraction)    Personal history of colonic polyps    Prediabetes    Residual hemorrhoidal skin tags    Spleen enlarged    nov 2023 hospitalized   Unspecified hemorrhoids     SURGICAL HISTORY: Past Surgical History:  Procedure Laterality Date   BONE MARROW BIOPSY     IR IMAGING GUIDED PORT INSERTION  09/14/2022   KIDNEY STONE SURGERY     retrieval    SOCIAL HISTORY: Social History   Socioeconomic History   Marital status: Married    Spouse name: Not on file   Number of children: Not on file   Years of education: Not on file   Highest education level: Not on file  Occupational History   Not on file  Tobacco Use   Smoking status: Former    Types: Cigarettes   Smokeless tobacco: Never  Substance and Sexual Activity   Alcohol use: Yes    Alcohol/week: 1.0 standard drink of alcohol    Types: 1 Cans of beer per week   Drug use: Not on file   Sexual activity: Not on file  Other Topics Concern   Not on file  Social History Narrative   Not on file   Social Determinants of Health   Financial Resource Strain: Not on file  Food Insecurity: Unknown (08/31/2022)   Hunger Vital Sign    Worried About Running  Out of Food in the Last Year: Never true    Ran Out of Food in the Last Year: Not on file  Transportation Needs: No Transportation Needs (08/31/2022)   PRAPARE - Administrator, Civil Service (Medical): No    Lack of Transportation (Non-Medical): No  Physical Activity: Not on file  Stress: Not on file  Social Connections: Not on file  Intimate Partner Violence: Not on file    FAMILY HISTORY: Family History  Problem Relation Age of Onset   Stroke Brother    Congestive Heart Failure Brother     ALLERGIES:  has No Known Allergies.  MEDICATIONS:  Current Outpatient Medications  Medication Sig Dispense Refill   ALFUZOSIN HCL ER PO Take 1 tablet by mouth daily  after supper.     aspirin EC 81 MG tablet Take 81 mg by mouth daily. Swallow whole.     B Complex Vitamins (B COMPLEX PO) Take 1 tablet by mouth daily.     Cholecalciferol (VITAMIN D3) 50 MCG (2000 UT) TABS Take 2,000 Units by mouth daily.     docusate sodium (COLACE) 100 MG capsule Take 100 mg by mouth 2 (two) times daily.     FIBER PO Take 1 capsule by mouth daily. Unknown strength     fish oil-omega-3 fatty acids 1000 MG capsule Take 1 g by mouth 2 (two) times daily. Strength 300mg /1500mg      Flaxseed, Linseed, (FLAXSEED OIL) 1000 MG CAPS Take 1,000 mg by mouth daily.     GLUCOSAMINE CHONDROITIN MSM PO Take 1 tablet by mouth daily. Strength 1500/1500     lidocaine-prilocaine (EMLA) cream Apply to affected area once (Patient taking differently: Apply 1 Application topically once. Apply to affected area once) 30 g 3   Misc Natural Products (PROSTATE SUPPORT PO) Take 1 capsule by mouth daily. Unknown strength     Multiple Vitamin (MULTIVITAMIN) tablet Take 1 tablet by mouth daily. Unknown strength     ondansetron (ZOFRAN) 8 MG tablet Take 1 tablet (8 mg total) by mouth every 8 (eight) hours as needed for nausea or vomiting. 30 tablet 1   prochlorperazine (COMPAZINE) 10 MG tablet Take 1 tablet (10 mg total) by mouth every 6 (six) hours as needed for nausea or vomiting. 30 tablet 1   psyllium (METAMUCIL) 58.6 % powder Take 1 packet by mouth daily.     senna-docusate (SENNA S) 8.6-50 MG tablet Take 2 tablets by mouth at bedtime. 60 tablet 1   traMADol (ULTRAM) 50 MG tablet Take 50 mg by mouth as needed for moderate pain or severe pain.     Turmeric (QC TUMERIC COMPLEX PO) Take 750 mg by mouth daily.     vitamin C (ASCORBIC ACID) 250 MG tablet Take 500 mg by mouth daily.     No current facility-administered medications for this visit.    REVIEW OF SYSTEMS:   10 Point review of Systems was done is negative except as noted above.   PHYSICAL EXAMINATION: .BP 113/65 (BP Location: Left Arm,  Patient Position: Sitting)   Pulse 71   Temp 97.9 F (36.6 C) (Temporal)   Resp 16   Wt 163 lb 1.6 oz (74 kg)   SpO2 100%   BMI 25.55 kg/m  NAD GENERAL:alert, in no acute distress and comfortable SKIN: no acute rashes, no significant lesions EYES: conjunctiva are pink and non-injected, sclera anicteric NECK: supple, no JVD LYMPH:  no palpable lymphadenopathy in the cervical, axillary or inguinal regions LUNGS: clear to  auscultation b/l with normal respiratory effort HEART: regular rate & rhythm ABDOMEN:  normoactive bowel sounds , non tender, not distended. Splenomegaly  Extremity: no pedal edema PSYCH: alert & oriented x 3 with fluent speech NEURO: no focal motor/sensory deficits  LABORATORY DATA:  I have reviewed the data as listed .    Latest Ref Rng & Units 03/21/2023    8:16 AM 03/17/2023    9:16 AM 03/17/2023    8:28 AM  CBC  WBC 4.0 - 10.5 K/uL 4.7  4.4  4.5   Hemoglobin 13.0 - 17.0 g/dL 7.5  7.7  8.0   Hematocrit 39.0 - 52.0 % 23.1  23.0  24.4   Platelets 150 - 400 K/uL 497  515  499      Iron/TIBC/Ferritin/ %Sat    Component Value Date/Time   IRON 180 09/30/2022 0924   TIBC 193 (L) 09/30/2022 0924   FERRITIN 1,450 (H) 09/30/2022 0924   IRONPCTSAT 93 (H) 09/30/2022 0924  .[name] Kitchen    Latest Ref Rng & Units 03/10/2023    9:11 AM 03/03/2023   12:30 PM 02/24/2023    9:10 AM  CMP  Glucose 70 - 99 mg/dL 161  096  045   BUN 8 - 23 mg/dL 15  20  24    Creatinine 0.61 - 1.24 mg/dL 4.09  8.11  9.14   Sodium 135 - 145 mmol/L 136  138  138   Potassium 3.5 - 5.1 mmol/L 4.3  4.4  4.1   Chloride 98 - 111 mmol/L 105  106  105   CO2 22 - 32 mmol/L 26  27  28    Calcium 8.9 - 10.3 mg/dL 8.1  8.3  8.4   Total Protein 6.5 - 8.1 g/dL 5.5  5.8  5.8   Total Bilirubin 0.3 - 1.2 mg/dL 0.8  0.7  0.5   Alkaline Phos 38 - 126 U/L 58  67  68   AST 15 - 41 U/L 9  11  16    ALT 0 - 44 U/L 14  24  26     . Lab Results  Component Value Date   LDH 110 01/04/2022    02/23/2021 BCR ABL     02/23/2021 JAK2   Surgical Pathology  CASE: [id number]  PATIENT: Fernando Lee  Bone Marrow Report  Clinical History: MPN with progressive anemia  (BH)  DIAGNOSIS:   BONE MARROW, ASPIRATE, CLOT, CORE:  -Hypercellular bone marrow with features of myeloid neoplasm  -See comment   PERIPHERAL BLOOD:  -Macrocytic anemia  -Leukocytosis  -Thrombocytosis   COMMENT:   The bone marrow/peripheral blood show persistent involvement by  previously known myeloid neoplasm.  There is a myeloproliferative  component as supported by previous JAK2 positivity.  However, there are  also dyspoietic changes primarily involving the megakaryocytic cell line  with numerous hypolobated/unilobated forms in addition to  dysgranulopoiesis to a lesser extent.  This is associated with  eosinophilia.  It is not entirely clear whether the overall findings  represent a myeloproliferative neoplasm with treatment related changes  or represent a primary myeloproliferative/myelodysplastic neoplasm  including but not limited to myeloid neoplasms with eosinophilia.  Correlation with cytogenetic and FISH studies strongly recommended.      RADIOGRAPHIC STUDIES: I have personally reviewed the radiological images as listed and agreed with the findings in the report. US Abdomen Complete  Result Date: 03/19/2023 CLINICAL DATA:  Evaluate spleen size EXAM: ABDOMEN ULTRASOUND COMPLETE COMPARISON:  Abdominal ultrasound April 03, 2021 FINDINGS: Gallbladder: Mild  sludge in the gallbladder. The gallbladder is otherwise normal. No Murphy's sign. Common bile duct: Diameter: 7.7 mm today versus 7.6 mm April 03, 2021 Liver: Mild increased echogenicity. No mass. Portal vein is patent on color Doppler imaging with normal direction of blood flow towards the liver. IVC: No abnormality visualized. Pancreas: Visualized portion unremarkable. Spleen: The spleen measures 22.9 by 21.9 x 11.1 cm. Right Kidney: Length: 10.5 cm. Echogenicity  within normal limits. No mass or hydronephrosis visualized. Left Kidney: Length: 9.2 cm. Echogenicity within normal limits. No mass or hydronephrosis visualized. Abdominal aorta: No aneurysm visualized. Other findings: None. IMPRESSION: 1. The spleen remains enlarged measuring 22.9 x 21.9 x 11.1 cm with a volume of 2895 cc. These findings are consistent with splenomegaly. The maximum measurement of the spleen on the previous study was 14.6 cm. There has been significant interval enlargement. 2. Mild sludge in the gallbladder. The gallbladder is otherwise normal. 3. The common bile duct is mildly dilated measuring 7.7 mm today versus 7.6 mm April 03, 2021. Stability is reassuring. Recommend correlation with LFTs. 4. Diffuse increased echogenicity throughout the liver is nonspecific and may be seen with hepatic steatosis or other underlying intrinsic liver disease. Electronically Signed   By: Gerome Sam III M.D.   On: 03/19/2023 11:48     IR IMAGING GUIDED PORT INSERTION  Result Date: 09/14/2022 INDICATION: Poor IV access.  Myelodysplastic syndrome EXAM: IMPLANTED PORT A CATH PLACEMENT WITH ULTRASOUND AND FLUOROSCOPIC GUIDANCE MEDICATIONS: None ANESTHESIA/SEDATION: Moderate (conscious) sedation was employed during this procedure. A total of Versed 3 mg and Fentanyl 100 mcg was administered intravenously. Moderate Sedation Time: 20 minutes. The patient's level of consciousness and vital signs were monitored continuously by radiology nursing throughout the procedure under my direct supervision. FLUOROSCOPY TIME:  Fluoroscopic dose; 1 mGy COMPLICATIONS: None immediate. PROCEDURE: The procedure, risks, benefits, and alternatives were explained to the patient. Questions regarding the procedure were encouraged and answered. The patient understands and consents to the procedure. The RIGHT neck and chest were prepped with chlorhexidine in a sterile fashion, and a sterile drape was applied covering the operative  field. Maximum barrier sterile technique with sterile gowns and gloves were used for the procedure. A timeout was performed prior to the initiation of the procedure. Local anesthesia was provided with 1% lidocaine with epinephrine. After creating a small venotomy incision, a micropuncture kit was utilized to access the internal jugular vein under direct, real-time ultrasound guidance. Ultrasound image documentation was performed. The microwire was kinked to measure appropriate catheter length. A subcutaneous port pocket was then created along the upper chest wall utilizing a combination of sharp and blunt dissection. The pocket was irrigated with sterile saline. A single lumen ISP power injectable port was chosen for placement. The 8 Fr catheter was tunneled from the port pocket site to the venotomy incision. The port was placed in the pocket. The external catheter was trimmed to appropriate length. At the venotomy, an 8 Fr peel-away sheath was placed over a guidewire under fluoroscopic guidance. The catheter was then placed through the sheath and the sheath was removed. Final catheter positioning was confirmed and documented with a fluoroscopic spot radiograph. The port was accessed with a Huber needle, aspirated and flushed with heparinized saline. The port pocket incision was closed with interrupted 3-0 Vicryl suture then Dermabond was applied, including at the venotomy incision. Dressings were placed. The patient tolerated the procedure well without immediate post procedural complication. IMPRESSION: Successful placement of a RIGHT internal jugular approach power  injectable Port-A-Cath. The tip of the catheter is positioned within the proximal RIGHT atrium. The catheter is ready for immediate use. Roanna Banning, MD [facility] Radiology Electronically Signed   By: Roanna Banning M.D.   On: 09/14/2022 17:18   CT ANGIO GI BLEED  Result Date: 08/31/2022 CLINICAL  DATA:  Acute mesenteric ischemia. Abdominal pain. History of myelodysplastic syndrome. EXAM: CTA ABDOMEN AND PELVIS WITHOUT AND WITH CONTRAST TECHNIQUE: Multidetector CT imaging of the abdomen and pelvis was performed using the standard protocol during bolus administration of intravenous contrast. Multiplanar reconstructed images and MIPs were obtained and reviewed to evaluate the vascular anatomy. RADIATION DOSE REDUCTION: This exam was performed according to the departmental dose-optimization program which includes automated exposure control, adjustment of the mA and/or kV according to patient size and/or use of iterative reconstruction technique. CONTRAST:  OMNIPAQUE IOHEXOL 350 MG/ML SOLN COMPARISON:  Abdominal ultrasound examination 04/03/2021 FINDINGS: VASCULAR Aorta: Normal caliber abdominal aorta. Scattered atherosclerotic calcifications. No dissection. Celiac: Normal SMA: Normal Renals: Normal IMA: Patent Inflow: Normal Proximal Outflow: Normal Veins: Normal Review of the MIP images confirms the above findings. NON-VASCULAR Lower chest: Small left pleural effusion and bibasilar atelectasis. The heart is normal in size. No pericardial effusion. There is a moderate to large hiatal hernia. Hepatobiliary: No hepatic lesions or intrahepatic biliary dilatation. The gallbladder is unremarkable. No common bile duct dilatation. Pancreas: No mass, inflammation or ductal dilatation. Spleen: Massive splenomegaly. The spleen measures 20 x 16 x 11 cm. No splenic lesions or splenic infarct. Adrenals/Urinary Tract: The left kidney is displaced medially and inferiorly by the enlarged spleen. No worrisome renal lesions,, hydronephrosis or pyelonephritis. There is a lower pole right renal calculus and there are 2 distal left ureteral calculi more proximal calculus measures 4 mm and the more distal calculus measures 5 mm. I do not see any hydroureter or hydronephrosis. Stomach/Bowel: The stomach, duodenum, small bowel  and colon are grossly normal. No inflammatory changes, mass lesions or obstructive findings. Lymphatic: No abdominal or pelvic lymphadenopathy. Reproductive: The prostate gland is enlarged. The seminal vesicles are unremarkable. Other: No pelvic mass or adenopathy. No free pelvic fluid collections. No inguinal mass or adenopathy. No abdominal wall hernia or subcutaneous lesions. Musculoskeletal: No significant bony findings. IMPRESSION: 1. Normal caliber abdominal aorta and no dissection. The branch vessels are normal. 2. Splenomegaly. 3. Two distal left ureteral calculi but no hydroureter or hydronephrosis. 4. Lower pole right renal calculus. 5. Moderate to large hiatal hernia. 6. Small left pleural effusion and bibasilar atelectasis. Electronically Signed   By: Rudie Meyer M.D.   On: 08/31/2022 18:13     ASSESSMENT & PLAN:    #1 JAK2-positive myeloproliferative neoplasm-primarily presenting with thrombocytosis and associated MDS causing refractory anemia -No polycythemia.  -Mild leukocytosis.  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 Has grade 1 out of 3 reticulin fibrosis.  Uncertain if this is primary or secondary. -LDH has remained within normal limits. 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 -CT Angio GI bleed scan from 09/01/2022 showed kidney stones.and significant splenomegaly  PLAN: -Discussed lab results from today, 03/21/2023, with the patient. CBC shows decreased hemoglobin at 7.5 g/dL, decreased hematocrit of 23.1%, and elevated platelets at 497 K. Patient is anemic during this visit. CMP shows elevated glucose level at 175, decreased total protein level at 5.6, and decreased AST at 10.  -Discussed that our goal will be to keep hemoglobin around 8.0. -Discussed Ultrasound results from 03/18/2023 with the patient.  Shows the spleen remains enlarged measuring 22.9 x 21.9 x 11.1 cm with a volume of 2895 cc. These findings are consistent with  splenomegaly. The maximum measurement of the spleen on the previous study was 14.6 cm. There has been significant interval enlargement.  The common bile duct is mildly dilated measuring 7.7 mm today versus 7.6 mm April 03, 2021. -Bone marrow biopsy from 03/16/2023 are pending.  -Educated the patient on JAK-2 inhibitor treatment with transfusion and discussed other treatment options. Discussed the side effects of JAK-2 inhibitor.  -We will wait on bone marrow results before deciding on treatment. -Answered all of patient's questions regarding next treatment options.   -discontinuing Vidaza -We will send testosterone level with the next lab.  -Discussed the option of Prednisone for a month after bone marrow results.  -Continue blood transfusion as needed every Thursday.  -Continue monitoring weekly labs and transfuse as needed for hemoglobin less than 7.5. 1 blood unit weekly as needed.     Follow-up: -discontinue Vidaza treatments -continue weekly labs and PRBC transfusion support -RTC with Dr Candise Che in 2 weeks to discuss next treatment strategies.  The total time spent in the appointment was 30 minutes* .  All of the patient's questions were answered with apparent satisfaction. The patient knows to call the clinic with any problems, questions or concerns.   Wyvonnia Lora MD MS AAHIVMS [facility] Hematology/Oncology Physician [facility]  .*Total Encounter Time as defined by the Centers for Medicare and Medicaid Services includes, in addition to the face-to-face time of a patient visit (documented in the note above) non-face-to-face time: obtaining and reviewing outside history, ordering and reviewing medications, tests or procedures, care coordination (communications with other health care professionals or caregivers) and documentation in the medical record.  Colonel Bald, am acting as a Neurosurgeon for Wyvonnia Lora, MD. .Johney Maine MD   ADDENDUM  BMBx -- discussed with Dr  Lowella Bandy. No increase in blast. Still with MDS/MPN.  Recommended REvlimid at 10mg  po daiyl 3w on 1w off.

## 2023-03-22 ENCOUNTER — Other Ambulatory Visit: Payer: Self-pay

## 2023-03-22 ENCOUNTER — Inpatient Hospital Stay: Payer: Medicare Other

## 2023-03-22 DIAGNOSIS — D469 Myelodysplastic syndrome, unspecified: Secondary | ICD-10-CM

## 2023-03-23 ENCOUNTER — Other Ambulatory Visit: Payer: Self-pay

## 2023-03-23 ENCOUNTER — Inpatient Hospital Stay: Payer: Medicare Other

## 2023-03-23 DIAGNOSIS — Z8719 Personal history of other diseases of the digestive system: Secondary | ICD-10-CM | POA: Diagnosis not present

## 2023-03-23 DIAGNOSIS — R739 Hyperglycemia, unspecified: Secondary | ICD-10-CM | POA: Diagnosis not present

## 2023-03-23 DIAGNOSIS — E785 Hyperlipidemia, unspecified: Secondary | ICD-10-CM | POA: Diagnosis not present

## 2023-03-23 DIAGNOSIS — Z7961 Long term (current) use of immunomodulator: Secondary | ICD-10-CM | POA: Diagnosis not present

## 2023-03-23 DIAGNOSIS — N202 Calculus of kidney with calculus of ureter: Secondary | ICD-10-CM | POA: Diagnosis not present

## 2023-03-23 DIAGNOSIS — Z7982 Long term (current) use of aspirin: Secondary | ICD-10-CM | POA: Diagnosis not present

## 2023-03-23 DIAGNOSIS — E782 Mixed hyperlipidemia: Secondary | ICD-10-CM | POA: Diagnosis not present

## 2023-03-23 DIAGNOSIS — Z79899 Other long term (current) drug therapy: Secondary | ICD-10-CM | POA: Diagnosis not present

## 2023-03-23 DIAGNOSIS — J90 Pleural effusion, not elsewhere classified: Secondary | ICD-10-CM | POA: Diagnosis not present

## 2023-03-23 DIAGNOSIS — K828 Other specified diseases of gallbladder: Secondary | ICD-10-CM | POA: Diagnosis not present

## 2023-03-23 DIAGNOSIS — D469 Myelodysplastic syndrome, unspecified: Secondary | ICD-10-CM

## 2023-03-23 DIAGNOSIS — D75839 Thrombocytosis, unspecified: Secondary | ICD-10-CM | POA: Diagnosis not present

## 2023-03-23 DIAGNOSIS — R161 Splenomegaly, not elsewhere classified: Secondary | ICD-10-CM | POA: Diagnosis not present

## 2023-03-23 DIAGNOSIS — D464 Refractory anemia, unspecified: Secondary | ICD-10-CM | POA: Diagnosis not present

## 2023-03-23 DIAGNOSIS — K219 Gastro-esophageal reflux disease without esophagitis: Secondary | ICD-10-CM | POA: Diagnosis not present

## 2023-03-23 DIAGNOSIS — Z87891 Personal history of nicotine dependence: Secondary | ICD-10-CM | POA: Diagnosis not present

## 2023-03-23 DIAGNOSIS — M1711 Unilateral primary osteoarthritis, right knee: Secondary | ICD-10-CM | POA: Diagnosis not present

## 2023-03-23 DIAGNOSIS — Z86010 Personal history of colonic polyps: Secondary | ICD-10-CM | POA: Diagnosis not present

## 2023-03-23 DIAGNOSIS — D539 Nutritional anemia, unspecified: Secondary | ICD-10-CM | POA: Diagnosis not present

## 2023-03-23 DIAGNOSIS — R61 Generalized hyperhidrosis: Secondary | ICD-10-CM | POA: Diagnosis not present

## 2023-03-23 DIAGNOSIS — R5383 Other fatigue: Secondary | ICD-10-CM | POA: Diagnosis not present

## 2023-03-23 DIAGNOSIS — K449 Diaphragmatic hernia without obstruction or gangrene: Secondary | ICD-10-CM | POA: Diagnosis not present

## 2023-03-23 LAB — PREPARE RBC (CROSSMATCH)

## 2023-03-23 LAB — BPAM RBC: Unit Type and Rh: 6200

## 2023-03-24 ENCOUNTER — Inpatient Hospital Stay: Payer: Medicare Other

## 2023-03-24 ENCOUNTER — Other Ambulatory Visit: Payer: Self-pay

## 2023-03-24 VITALS — BP 110/62 | HR 63 | Temp 97.9°F | Resp 16

## 2023-03-24 DIAGNOSIS — Z8719 Personal history of other diseases of the digestive system: Secondary | ICD-10-CM | POA: Diagnosis not present

## 2023-03-24 DIAGNOSIS — D469 Myelodysplastic syndrome, unspecified: Secondary | ICD-10-CM | POA: Diagnosis not present

## 2023-03-24 DIAGNOSIS — N202 Calculus of kidney with calculus of ureter: Secondary | ICD-10-CM | POA: Diagnosis not present

## 2023-03-24 DIAGNOSIS — D539 Nutritional anemia, unspecified: Secondary | ICD-10-CM | POA: Diagnosis not present

## 2023-03-24 DIAGNOSIS — K449 Diaphragmatic hernia without obstruction or gangrene: Secondary | ICD-10-CM | POA: Diagnosis not present

## 2023-03-24 DIAGNOSIS — R61 Generalized hyperhidrosis: Secondary | ICD-10-CM | POA: Diagnosis not present

## 2023-03-24 DIAGNOSIS — Z86010 Personal history of colonic polyps: Secondary | ICD-10-CM | POA: Diagnosis not present

## 2023-03-24 DIAGNOSIS — J90 Pleural effusion, not elsewhere classified: Secondary | ICD-10-CM | POA: Diagnosis not present

## 2023-03-24 DIAGNOSIS — K219 Gastro-esophageal reflux disease without esophagitis: Secondary | ICD-10-CM | POA: Diagnosis not present

## 2023-03-24 DIAGNOSIS — K828 Other specified diseases of gallbladder: Secondary | ICD-10-CM | POA: Diagnosis not present

## 2023-03-24 DIAGNOSIS — R161 Splenomegaly, not elsewhere classified: Secondary | ICD-10-CM | POA: Diagnosis not present

## 2023-03-24 DIAGNOSIS — Z7982 Long term (current) use of aspirin: Secondary | ICD-10-CM | POA: Diagnosis not present

## 2023-03-24 DIAGNOSIS — Z7961 Long term (current) use of immunomodulator: Secondary | ICD-10-CM | POA: Diagnosis not present

## 2023-03-24 DIAGNOSIS — D464 Refractory anemia, unspecified: Secondary | ICD-10-CM | POA: Diagnosis not present

## 2023-03-24 DIAGNOSIS — E782 Mixed hyperlipidemia: Secondary | ICD-10-CM | POA: Diagnosis not present

## 2023-03-24 DIAGNOSIS — E785 Hyperlipidemia, unspecified: Secondary | ICD-10-CM | POA: Diagnosis not present

## 2023-03-24 DIAGNOSIS — M1711 Unilateral primary osteoarthritis, right knee: Secondary | ICD-10-CM | POA: Diagnosis not present

## 2023-03-24 DIAGNOSIS — R739 Hyperglycemia, unspecified: Secondary | ICD-10-CM | POA: Diagnosis not present

## 2023-03-24 DIAGNOSIS — R5383 Other fatigue: Secondary | ICD-10-CM | POA: Diagnosis not present

## 2023-03-24 DIAGNOSIS — D649 Anemia, unspecified: Secondary | ICD-10-CM

## 2023-03-24 DIAGNOSIS — Z79899 Other long term (current) drug therapy: Secondary | ICD-10-CM | POA: Diagnosis not present

## 2023-03-24 DIAGNOSIS — Z87891 Personal history of nicotine dependence: Secondary | ICD-10-CM | POA: Diagnosis not present

## 2023-03-24 DIAGNOSIS — D75839 Thrombocytosis, unspecified: Secondary | ICD-10-CM | POA: Diagnosis not present

## 2023-03-24 LAB — SAMPLE TO BLOOD BANK

## 2023-03-24 LAB — CBC WITH DIFFERENTIAL (CANCER CENTER ONLY)
Abs Immature Granulocytes: 0.3 10*3/uL — ABNORMAL HIGH (ref 0.00–0.07)
Basophils Absolute: 0.2 10*3/uL — ABNORMAL HIGH (ref 0.0–0.1)
Basophils Relative: 5 %
Eosinophils Absolute: 0.5 10*3/uL (ref 0.0–0.5)
Eosinophils Relative: 11 %
HCT: 22.4 % — ABNORMAL LOW (ref 39.0–52.0)
Hemoglobin: 7.2 g/dL — ABNORMAL LOW (ref 13.0–17.0)
Immature Granulocytes: 7 %
Lymphocytes Relative: 11 %
Lymphs Abs: 0.5 10*3/uL — ABNORMAL LOW (ref 0.7–4.0)
MCH: 28 pg (ref 26.0–34.0)
MCHC: 32.1 g/dL (ref 30.0–36.0)
MCV: 87.2 fL (ref 80.0–100.0)
Monocytes Absolute: 0.3 10*3/uL (ref 0.1–1.0)
Monocytes Relative: 7 %
Neutro Abs: 2.7 10*3/uL (ref 1.7–7.7)
Neutrophils Relative %: 59 %
Platelet Count: 443 10*3/uL — ABNORMAL HIGH (ref 150–400)
RBC: 2.57 MIL/uL — ABNORMAL LOW (ref 4.22–5.81)
RDW: 15.9 % — ABNORMAL HIGH (ref 11.5–15.5)
WBC Count: 4.5 10*3/uL (ref 4.0–10.5)
nRBC: 0 % (ref 0.0–0.2)

## 2023-03-24 LAB — TYPE AND SCREEN
ABO/RH(D): A POS
Antibody Screen: NEGATIVE

## 2023-03-24 LAB — BPAM RBC: ISSUE DATE / TIME: 202406060837

## 2023-03-24 MED ORDER — HEPARIN SOD (PORK) LOCK FLUSH 100 UNIT/ML IV SOLN
500.0000 [IU] | Freq: Once | INTRAVENOUS | Status: AC
  Administered 2023-03-24: 500 [IU]

## 2023-03-24 MED ORDER — SODIUM CHLORIDE 0.9% FLUSH
10.0000 mL | Freq: Once | INTRAVENOUS | Status: AC
  Administered 2023-03-24: 10 mL

## 2023-03-24 MED ORDER — ACETAMINOPHEN 325 MG PO TABS
650.0000 mg | ORAL_TABLET | Freq: Once | ORAL | Status: AC
  Administered 2023-03-24: 650 mg via ORAL
  Filled 2023-03-24: qty 2

## 2023-03-24 MED ORDER — SODIUM CHLORIDE 0.9% IV SOLUTION
250.0000 mL | Freq: Once | INTRAVENOUS | Status: DC
  Administered 2023-03-24: 250 mL via INTRAVENOUS

## 2023-03-24 MED ORDER — SODIUM CHLORIDE 0.9% FLUSH: 10.0000 mL | INTRAVENOUS | Status: DC | PRN

## 2023-03-24 MED ORDER — HEPARIN SOD (PORK) LOCK FLUSH 100 UNIT/ML IV SOLN: 500.0000 [IU] | Freq: Every day | INTRAVENOUS | Status: DC | PRN

## 2023-03-24 MED ORDER — METHYLPREDNISOLONE SODIUM SUCC 40 MG IJ SOLR
40.0000 mg | Freq: Once | INTRAMUSCULAR | Status: DC
  Administered 2023-03-24: 40 mg via INTRAVENOUS
  Filled 2023-03-24: qty 1

## 2023-03-24 NOTE — Patient Instructions (Signed)
Blood Transfusion, Adult, Care After The following information offers guidance on how to care for yourself after your procedure. Your health care provider may also give you more specific instructions. If you have problems or questions, contact your health care provider. What can I expect after the procedure? After the procedure, it is common to have: Bruising and soreness where the IV was inserted. A headache. Follow these instructions at home: IV insertion site care     Follow instructions from your health care provider about how to take care of your IV insertion site. Make sure you: Wash your hands with soap and water for at least 20 seconds before and after you change your bandage (dressing). If soap and water are not available, use hand sanitizer. Change your dressing as told by your health care provider. Check your IV insertion site every day for signs of infection. Check for: Redness, swelling, or pain. Bleeding from the site. Warmth. Pus or a bad smell. General instructions Take over-the-counter and prescription medicines only as told by your health care provider. Rest as told by your health care provider. Return to your normal activities as told by your health care provider. Keep all follow-up visits. Lab tests may need to be done at certain periods to recheck your blood counts. Contact a health care provider if: You have itching or red, swollen areas of skin (hives). You have a fever or chills. You have pain in the head, back, or chest. You feel anxious or you feel weak after doing your normal activities. You have redness, swelling, warmth, or pain around the IV insertion site. You have blood coming from the IV insertion site that does not stop with pressure. You have pus or a bad smell coming from your IV insertion site. If you received your blood transfusion in an outpatient setting, you will be told whom to contact to report any reactions. Get help right away if: You  have symptoms of a serious allergic or immune system reaction, including: Trouble breathing or shortness of breath. Swelling of the face, feeling flushed, or widespread rash. Dark urine or blood in the urine. Fast heartbeat. These symptoms may be an emergency. Get help right away. Call 911. Do not wait to see if the symptoms will go away. Do not drive yourself to the hospital. Summary Bruising and soreness around the IV insertion site are common. Check your IV insertion site every day for signs of infection. Rest as told by your health care provider. Return to your normal activities as told by your health care provider. Get help right away for symptoms of a serious allergic or immune system reaction to the blood transfusion. This information is not intended to replace advice given to you by your health care provider. Make sure you discuss any questions you have with your health care provider. Document Revised: 01/01/2022 Document Reviewed: 01/01/2022 Elsevier Patient Education  2024 Elsevier Inc.  

## 2023-03-24 NOTE — Addendum Note (Signed)
Addended by: Julieanne Manson on: 03/24/2023 08:55 AM   Modules accepted: Orders

## 2023-03-25 ENCOUNTER — Inpatient Hospital Stay: Payer: Medicare Other

## 2023-03-25 LAB — TYPE AND SCREEN: Unit division: 0

## 2023-03-25 LAB — BPAM RBC
Blood Product Expiration Date: 202406242359
Unit Type and Rh: 6200

## 2023-03-28 ENCOUNTER — Encounter: Payer: Self-pay | Admitting: Hematology

## 2023-03-29 ENCOUNTER — Other Ambulatory Visit: Payer: Self-pay

## 2023-03-29 ENCOUNTER — Telehealth: Payer: Self-pay | Admitting: Pharmacist

## 2023-03-29 ENCOUNTER — Other Ambulatory Visit: Payer: Self-pay | Admitting: Hematology

## 2023-03-29 ENCOUNTER — Other Ambulatory Visit (HOSPITAL_COMMUNITY): Payer: Self-pay

## 2023-03-29 ENCOUNTER — Telehealth: Payer: Self-pay | Admitting: Pharmacy Technician

## 2023-03-29 DIAGNOSIS — D469 Myelodysplastic syndrome, unspecified: Secondary | ICD-10-CM

## 2023-03-29 MED ORDER — LENALIDOMIDE 10 MG PO CAPS
10.0000 mg | ORAL_CAPSULE | Freq: Every day | ORAL | 1 refills | Status: DC
  Filled 2023-03-29: qty 21, 21d supply, fill #0

## 2023-03-29 NOTE — Telephone Encounter (Signed)
Oral Oncology Patient Advocate Encounter   Received notification that prior authorization for lenalidomide is required.   PA submitted on 03/29/23 Key [id number] Status is pending     Jinger Neighbors, CPhT-Adv Oncology Pharmacy Patient Advocate [facility] Direct Number: [phone]  Fax: [phone]

## 2023-03-29 NOTE — Telephone Encounter (Signed)
Oral Oncology Pharmacist Encounter  Received new prescription for Revlimid (lenalidomide) for the treatment of MDS/MPN, planned duration until disease progression or unacceptable drug toxicity.  CBC w/ Diff from 03/24/23 and CMP from 03/21/23 assessed, no baseline dose adjustments of Revlimid required. Prescription dose and frequency assessed for appropriateness.  Current medication list in Epic reviewed, no relevant/significant DDIs with Revlimid identified.  Evaluated chart and no patient barriers to medication adherence noted.   Oral Oncology Clinic will continue to follow for insurance authorization, copayment issues, initial counseling and start date.  Lenord Carbo, PharmD, BCPS, [facility] Hematology/Oncology Clinical Pharmacist [facility] and [facility] Oral Chemotherapy Navigation Clinics [phone] 03/29/2023 4:49 PM

## 2023-03-30 ENCOUNTER — Telehealth: Payer: Self-pay | Admitting: Pharmacy Technician

## 2023-03-30 ENCOUNTER — Other Ambulatory Visit (HOSPITAL_COMMUNITY): Payer: Self-pay

## 2023-03-30 ENCOUNTER — Other Ambulatory Visit: Payer: Self-pay

## 2023-03-30 NOTE — Telephone Encounter (Addendum)
Oral Oncology Patient Advocate Encounter   Was successful in securing patient an $3,250 grant from [employer] [facility]) to provide copayment coverage for lenalidomide.  This will keep the out of pocket expense at $0.    The billing information is as follows and has been shared with [employer]  Member ID: [id number] Group ID: [id number] RxBin: [id number] Dates of Eligibility: 12/29/22 through 03/27/24  Fund:  Ph- Myeloproliferative Neoplasms  Jinger Neighbors, CPhT-Adv Oncology Pharmacy Patient Advocate [facility] Direct Number: [phone]  Fax: [phone]

## 2023-03-30 NOTE — Telephone Encounter (Signed)
Oral Oncology Patient Advocate Encounter  Prior Authorization for lenalidomide has been approved.    PA# [id number] Effective dates: 03/30/23 through 10/18/23  Patients co-pay is $3,885.56   Jinger Neighbors, CPhT-Adv Oncology Pharmacy Patient Advocate [facility] Direct Number: [phone]  Fax: [phone]

## 2023-03-30 NOTE — Telephone Encounter (Signed)
Oral Oncology Patient Advocate Encounter   Was successful in securing patient a $ 3,500 grant from [employer] to provide copayment coverage for lenalidomide.  The patient's out of pocket cost will be $5 monthly.    The billing information is as follows and has been shared with [facility].   Member ID: [id number] Group ID: [id number] RxBin: [id number] Dates of Eligibility: 03/30/23 through 10/18/23   Jinger Neighbors, CPhT-Adv Oncology Pharmacy Patient Advocate [facility] Direct Number: [phone]  Fax: [phone]

## 2023-03-31 ENCOUNTER — Inpatient Hospital Stay: Payer: Medicare Other

## 2023-03-31 ENCOUNTER — Other Ambulatory Visit: Payer: Self-pay

## 2023-03-31 ENCOUNTER — Telehealth: Payer: Self-pay

## 2023-03-31 DIAGNOSIS — J90 Pleural effusion, not elsewhere classified: Secondary | ICD-10-CM | POA: Diagnosis not present

## 2023-03-31 DIAGNOSIS — D469 Myelodysplastic syndrome, unspecified: Secondary | ICD-10-CM

## 2023-03-31 DIAGNOSIS — D464 Refractory anemia, unspecified: Secondary | ICD-10-CM | POA: Diagnosis not present

## 2023-03-31 DIAGNOSIS — Z7961 Long term (current) use of immunomodulator: Secondary | ICD-10-CM | POA: Diagnosis not present

## 2023-03-31 DIAGNOSIS — D539 Nutritional anemia, unspecified: Secondary | ICD-10-CM | POA: Diagnosis not present

## 2023-03-31 DIAGNOSIS — M1711 Unilateral primary osteoarthritis, right knee: Secondary | ICD-10-CM | POA: Diagnosis not present

## 2023-03-31 DIAGNOSIS — R61 Generalized hyperhidrosis: Secondary | ICD-10-CM | POA: Diagnosis not present

## 2023-03-31 DIAGNOSIS — Z7982 Long term (current) use of aspirin: Secondary | ICD-10-CM | POA: Diagnosis not present

## 2023-03-31 DIAGNOSIS — Z87891 Personal history of nicotine dependence: Secondary | ICD-10-CM | POA: Diagnosis not present

## 2023-03-31 DIAGNOSIS — Z79899 Other long term (current) drug therapy: Secondary | ICD-10-CM | POA: Diagnosis not present

## 2023-03-31 DIAGNOSIS — E785 Hyperlipidemia, unspecified: Secondary | ICD-10-CM | POA: Diagnosis not present

## 2023-03-31 DIAGNOSIS — N202 Calculus of kidney with calculus of ureter: Secondary | ICD-10-CM | POA: Diagnosis not present

## 2023-03-31 DIAGNOSIS — K449 Diaphragmatic hernia without obstruction or gangrene: Secondary | ICD-10-CM | POA: Diagnosis not present

## 2023-03-31 DIAGNOSIS — R5383 Other fatigue: Secondary | ICD-10-CM | POA: Diagnosis not present

## 2023-03-31 DIAGNOSIS — R739 Hyperglycemia, unspecified: Secondary | ICD-10-CM | POA: Diagnosis not present

## 2023-03-31 DIAGNOSIS — Z86010 Personal history of colonic polyps: Secondary | ICD-10-CM | POA: Diagnosis not present

## 2023-03-31 DIAGNOSIS — K219 Gastro-esophageal reflux disease without esophagitis: Secondary | ICD-10-CM | POA: Diagnosis not present

## 2023-03-31 DIAGNOSIS — E782 Mixed hyperlipidemia: Secondary | ICD-10-CM | POA: Diagnosis not present

## 2023-03-31 DIAGNOSIS — D75839 Thrombocytosis, unspecified: Secondary | ICD-10-CM | POA: Diagnosis not present

## 2023-03-31 DIAGNOSIS — Z8719 Personal history of other diseases of the digestive system: Secondary | ICD-10-CM | POA: Diagnosis not present

## 2023-03-31 DIAGNOSIS — R161 Splenomegaly, not elsewhere classified: Secondary | ICD-10-CM | POA: Diagnosis not present

## 2023-03-31 DIAGNOSIS — K828 Other specified diseases of gallbladder: Secondary | ICD-10-CM | POA: Diagnosis not present

## 2023-03-31 LAB — CBC WITH DIFFERENTIAL (CANCER CENTER ONLY)
Abs Immature Granulocytes: 0.28 10*3/uL — ABNORMAL HIGH (ref 0.00–0.07)
Basophils Absolute: 0.1 10*3/uL (ref 0.0–0.1)
Basophils Relative: 3 %
Eosinophils Absolute: 0.4 10*3/uL (ref 0.0–0.5)
Eosinophils Relative: 9 %
HCT: 21.1 % — ABNORMAL LOW (ref 39.0–52.0)
Hemoglobin: 6.8 g/dL — CL (ref 13.0–17.0)
Immature Granulocytes: 6 %
Lymphocytes Relative: 9 %
Lymphs Abs: 0.4 10*3/uL — ABNORMAL LOW (ref 0.7–4.0)
MCH: 28.5 pg (ref 26.0–34.0)
MCHC: 32.2 g/dL (ref 30.0–36.0)
MCV: 88.3 fL (ref 80.0–100.0)
Monocytes Absolute: 0.4 10*3/uL (ref 0.1–1.0)
Monocytes Relative: 8 %
Neutro Abs: 3.1 10*3/uL (ref 1.7–7.7)
Neutrophils Relative %: 65 %
Platelet Count: 355 10*3/uL (ref 150–400)
RBC: 2.39 MIL/uL — ABNORMAL LOW (ref 4.22–5.81)
RDW: 15.4 % (ref 11.5–15.5)
WBC Count: 4.7 10*3/uL (ref 4.0–10.5)
nRBC: 0 % (ref 0.0–0.2)

## 2023-03-31 LAB — CMP (CANCER CENTER ONLY)
ALT: 18 U/L (ref 0–44)
AST: 11 U/L — ABNORMAL LOW (ref 15–41)
Albumin: 3.8 g/dL (ref 3.5–5.0)
Alkaline Phosphatase: 55 U/L (ref 38–126)
Anion gap: 5 (ref 5–15)
BUN: 21 mg/dL (ref 8–23)
CO2: 26 mmol/L (ref 22–32)
Calcium: 8.5 mg/dL — ABNORMAL LOW (ref 8.9–10.3)
Chloride: 105 mmol/L (ref 98–111)
Creatinine: 0.86 mg/dL (ref 0.61–1.24)
GFR, Estimated: >60 mL/min (ref >60)
Glucose, Bld: 170 mg/dL — ABNORMAL HIGH (ref 70–99)
Potassium: 4.3 mmol/L (ref 3.5–5.1)
Sodium: 136 mmol/L (ref 135–145)
Total Bilirubin: 0.8 mg/dL (ref 0.3–1.2)
Total Protein: 6 g/dL — ABNORMAL LOW (ref 6.5–8.1)

## 2023-03-31 LAB — BPAM RBC
Blood Product Expiration Date: 202407052359
ISSUE DATE / TIME: 202406131019

## 2023-03-31 LAB — TYPE AND SCREEN: Unit division: 0

## 2023-03-31 LAB — PREPARE RBC (CROSSMATCH)

## 2023-03-31 LAB — SAMPLE TO BLOOD BANK

## 2023-03-31 MED ORDER — LENALIDOMIDE 10 MG PO CAPS
10.0000 mg | ORAL_CAPSULE | Freq: Every day | ORAL | 1 refills | Status: DC
Start: 2023-03-31 — End: 2023-04-05

## 2023-03-31 MED ORDER — SODIUM CHLORIDE 0.9% IV SOLUTION
250.0000 mL | Freq: Once | INTRAVENOUS | Status: AC
  Administered 2023-03-31: 250 mL via INTRAVENOUS

## 2023-03-31 MED ORDER — ACETAMINOPHEN 325 MG PO TABS
650.0000 mg | ORAL_TABLET | Freq: Once | ORAL | Status: AC
  Administered 2023-03-31: 650 mg via ORAL
  Filled 2023-03-31: qty 2

## 2023-03-31 MED ORDER — HEPARIN SOD (PORK) LOCK FLUSH 100 UNIT/ML IV SOLN
500.0000 [IU] | Freq: Every day | INTRAVENOUS | Status: AC | PRN
  Administered 2023-03-31: 500 [IU]

## 2023-03-31 MED ORDER — METHYLPREDNISOLONE SODIUM SUCC 40 MG IJ SOLR
40.0000 mg | Freq: Once | INTRAMUSCULAR | Status: AC
  Administered 2023-03-31: 40 mg via INTRAVENOUS
  Filled 2023-03-31: qty 1

## 2023-03-31 MED ORDER — LENALIDOMIDE 10 MG PO CAPS
10.0000 mg | ORAL_CAPSULE | Freq: Every day | ORAL | 1 refills | Status: DC
Start: 2023-03-31 — End: 2023-03-31

## 2023-03-31 MED ORDER — SODIUM CHLORIDE 0.9% FLUSH
10.0000 mL | INTRAVENOUS | Status: AC | PRN
  Administered 2023-03-31: 10 mL

## 2023-03-31 MED ORDER — SODIUM CHLORIDE 0.9% FLUSH
10.0000 mL | Freq: Once | INTRAVENOUS | Status: AC
  Administered 2023-03-31: 10 mL

## 2023-03-31 NOTE — Telephone Encounter (Signed)
CRITICAL VALUE STICKER  CRITICAL VALUE:   Hgb 6.8  RECEIVER (on-site recipient of call): Daneil Dolin, LPN  DATE & TIME NOTIFIED: 03/31/23  08:31  MESSENGER (representative from lab): Byrd Hesselbach  MD NOTIFIED: Wyvonnia Lora, MD  TIME OF NOTIFICATION:  08:36  RESPONSE:

## 2023-03-31 NOTE — Patient Instructions (Signed)

## 2023-03-31 NOTE — Progress Notes (Signed)
Per parameters Hemoglobin 6.8 g/dL, pt. to receive 1 unit Packed Red blood cells.

## 2023-04-01 LAB — TYPE AND SCREEN
ABO/RH(D): A POS
Antibody Screen: NEGATIVE

## 2023-04-01 LAB — BPAM RBC: Unit Type and Rh: 6200

## 2023-04-04 ENCOUNTER — Other Ambulatory Visit: Payer: Self-pay

## 2023-04-04 DIAGNOSIS — D469 Myelodysplastic syndrome, unspecified: Secondary | ICD-10-CM

## 2023-04-05 ENCOUNTER — Encounter: Payer: Self-pay | Admitting: Hematology

## 2023-04-05 ENCOUNTER — Other Ambulatory Visit (HOSPITAL_COMMUNITY): Payer: Self-pay

## 2023-04-05 MED ORDER — REVLIMID 10 MG PO CAPS
10.0000 mg | ORAL_CAPSULE | Freq: Every day | ORAL | 0 refills | Status: DC
Start: 2023-04-05 — End: 2023-04-27

## 2023-04-05 NOTE — Telephone Encounter (Signed)
Oral Oncology Pharmacist Encounter   Notified by [employer] that for [facility] grant to be applied to patient's prescription, Revlimid needs to be sent in as branded, [email or web address].   Prescription resent as branded Revlimid for processing. Oral Chemotherapy Clinic will continue to follow up with outside pharmacy to ensure Rx is delivered.    Lenord Carbo, PharmD, BCPS, [facility] Hematology/Oncology Clinical Pharmacist [facility] and [facility] Oral Chemotherapy Navigation Clinics [phone] 04/05/2023 3:26 PM

## 2023-04-06 ENCOUNTER — Other Ambulatory Visit: Payer: Self-pay

## 2023-04-06 ENCOUNTER — Inpatient Hospital Stay: Payer: Medicare Other

## 2023-04-06 ENCOUNTER — Inpatient Hospital Stay (HOSPITAL_BASED_OUTPATIENT_CLINIC_OR_DEPARTMENT_OTHER): Payer: Medicare Other | Admitting: Hematology

## 2023-04-06 ENCOUNTER — Telehealth: Payer: Self-pay

## 2023-04-06 VITALS — BP 118/60 | HR 78 | Temp 98.2°F | Resp 17 | Wt 159.9 lb

## 2023-04-06 DIAGNOSIS — D469 Myelodysplastic syndrome, unspecified: Secondary | ICD-10-CM | POA: Diagnosis not present

## 2023-04-06 DIAGNOSIS — J90 Pleural effusion, not elsewhere classified: Secondary | ICD-10-CM | POA: Diagnosis not present

## 2023-04-06 DIAGNOSIS — Z8719 Personal history of other diseases of the digestive system: Secondary | ICD-10-CM | POA: Diagnosis not present

## 2023-04-06 DIAGNOSIS — M1711 Unilateral primary osteoarthritis, right knee: Secondary | ICD-10-CM | POA: Diagnosis not present

## 2023-04-06 DIAGNOSIS — Z79899 Other long term (current) drug therapy: Secondary | ICD-10-CM | POA: Diagnosis not present

## 2023-04-06 DIAGNOSIS — Z87891 Personal history of nicotine dependence: Secondary | ICD-10-CM | POA: Diagnosis not present

## 2023-04-06 DIAGNOSIS — D539 Nutritional anemia, unspecified: Secondary | ICD-10-CM | POA: Diagnosis not present

## 2023-04-06 DIAGNOSIS — Z1589 Genetic susceptibility to other disease: Secondary | ICD-10-CM | POA: Diagnosis not present

## 2023-04-06 DIAGNOSIS — R61 Generalized hyperhidrosis: Secondary | ICD-10-CM | POA: Diagnosis not present

## 2023-04-06 DIAGNOSIS — D75839 Thrombocytosis, unspecified: Secondary | ICD-10-CM | POA: Diagnosis not present

## 2023-04-06 DIAGNOSIS — R739 Hyperglycemia, unspecified: Secondary | ICD-10-CM | POA: Diagnosis not present

## 2023-04-06 DIAGNOSIS — D471 Chronic myeloproliferative disease: Secondary | ICD-10-CM

## 2023-04-06 DIAGNOSIS — N202 Calculus of kidney with calculus of ureter: Secondary | ICD-10-CM | POA: Diagnosis not present

## 2023-04-06 DIAGNOSIS — R161 Splenomegaly, not elsewhere classified: Secondary | ICD-10-CM | POA: Diagnosis not present

## 2023-04-06 DIAGNOSIS — K828 Other specified diseases of gallbladder: Secondary | ICD-10-CM | POA: Diagnosis not present

## 2023-04-06 DIAGNOSIS — R5383 Other fatigue: Secondary | ICD-10-CM | POA: Diagnosis not present

## 2023-04-06 DIAGNOSIS — D649 Anemia, unspecified: Secondary | ICD-10-CM

## 2023-04-06 DIAGNOSIS — K219 Gastro-esophageal reflux disease without esophagitis: Secondary | ICD-10-CM | POA: Diagnosis not present

## 2023-04-06 DIAGNOSIS — E785 Hyperlipidemia, unspecified: Secondary | ICD-10-CM | POA: Diagnosis not present

## 2023-04-06 DIAGNOSIS — K449 Diaphragmatic hernia without obstruction or gangrene: Secondary | ICD-10-CM | POA: Diagnosis not present

## 2023-04-06 DIAGNOSIS — Z7982 Long term (current) use of aspirin: Secondary | ICD-10-CM | POA: Diagnosis not present

## 2023-04-06 DIAGNOSIS — D464 Refractory anemia, unspecified: Secondary | ICD-10-CM | POA: Diagnosis not present

## 2023-04-06 DIAGNOSIS — E782 Mixed hyperlipidemia: Secondary | ICD-10-CM | POA: Diagnosis not present

## 2023-04-06 DIAGNOSIS — Z86010 Personal history of colonic polyps: Secondary | ICD-10-CM | POA: Diagnosis not present

## 2023-04-06 DIAGNOSIS — Z7961 Long term (current) use of immunomodulator: Secondary | ICD-10-CM | POA: Diagnosis not present

## 2023-04-06 LAB — BPAM RBC

## 2023-04-06 LAB — CMP (CANCER CENTER ONLY)
ALT: 16 U/L (ref 0–44)
AST: 10 U/L — ABNORMAL LOW (ref 15–41)
Albumin: 3.7 g/dL (ref 3.5–5.0)
Alkaline Phosphatase: 56 U/L (ref 38–126)
Anion gap: 6 (ref 5–15)
BUN: 19 mg/dL (ref 8–23)
CO2: 25 mmol/L (ref 22–32)
Calcium: 8.2 mg/dL — ABNORMAL LOW (ref 8.9–10.3)
Chloride: 106 mmol/L (ref 98–111)
Creatinine: 0.9 mg/dL (ref 0.61–1.24)
GFR, Estimated: >60 mL/min (ref >60)
Glucose, Bld: 182 mg/dL — ABNORMAL HIGH (ref 70–99)
Potassium: 4.3 mmol/L (ref 3.5–5.1)
Sodium: 137 mmol/L (ref 135–145)
Total Bilirubin: 0.6 mg/dL (ref 0.3–1.2)
Total Protein: 5.5 g/dL — ABNORMAL LOW (ref 6.5–8.1)

## 2023-04-06 LAB — CBC WITH DIFFERENTIAL (CANCER CENTER ONLY)
Abs Immature Granulocytes: 0.23 10*3/uL — ABNORMAL HIGH (ref 0.00–0.07)
Basophils Absolute: 0.1 10*3/uL (ref 0.0–0.1)
Basophils Relative: 2 %
Eosinophils Absolute: 0.3 10*3/uL (ref 0.0–0.5)
Eosinophils Relative: 6 %
HCT: 19.3 % — ABNORMAL LOW (ref 39.0–52.0)
Hemoglobin: 6.3 g/dL — CL (ref 13.0–17.0)
Immature Granulocytes: 5 %
Lymphocytes Relative: 8 %
Lymphs Abs: 0.4 10*3/uL — ABNORMAL LOW (ref 0.7–4.0)
MCH: 28.5 pg (ref 26.0–34.0)
MCHC: 32.6 g/dL (ref 30.0–36.0)
MCV: 87.3 fL (ref 80.0–100.0)
Monocytes Absolute: 0.4 10*3/uL (ref 0.1–1.0)
Monocytes Relative: 9 %
Neutro Abs: 3.2 10*3/uL (ref 1.7–7.7)
Neutrophils Relative %: 70 %
Platelet Count: 336 10*3/uL (ref 150–400)
RBC: 2.21 MIL/uL — ABNORMAL LOW (ref 4.22–5.81)
RDW: 15.8 % — ABNORMAL HIGH (ref 11.5–15.5)
WBC Count: 4.6 10*3/uL (ref 4.0–10.5)
nRBC: 0 % (ref 0.0–0.2)

## 2023-04-06 LAB — PREPARE RBC (CROSSMATCH)

## 2023-04-06 LAB — TYPE AND SCREEN

## 2023-04-06 LAB — SAMPLE TO BLOOD BANK

## 2023-04-06 MED ORDER — SODIUM CHLORIDE 0.9% FLUSH
10.0000 mL | Freq: Once | INTRAVENOUS | Status: AC
  Administered 2023-04-06: 10 mL

## 2023-04-06 MED ORDER — PREDNISONE 20 MG PO TABS: ORAL_TABLET | ORAL | 0 refills | Status: AC

## 2023-04-06 MED ORDER — HEPARIN SOD (PORK) LOCK FLUSH 100 UNIT/ML IV SOLN
500.0000 [IU] | Freq: Once | INTRAVENOUS | Status: AC
  Administered 2023-04-06: 500 [IU]

## 2023-04-06 NOTE — Progress Notes (Signed)
HEMATOLOGY/ONCOLOGY PROGRESS NOTE:   Date of Service: 04/06/23  Patient Care Team: Jackelyn Poling, DO as PCP - General (Family Medicine)  CHIEF COMPLAINTS:  Follow-up for continued evaluation and management of JAK2 positive myeloproliferative neoplasm/MDS  INTERVAL HISTORY:  Fernando Lee is a 77 y.o. male here for continued evaluation and management of his MPN/MDS with refractory anemia and thrombocytosis. Patient was last seen by me on 03/21/2023 and complained of persistent fatigue, bilateral leg swelling, mild night sweats, and skin changes.   Today, he is accompanied by his wife. He complains of fatigue. Patient denies having any blood clots in the past.   He denies any black stools, blood in stools, nose bleeds Patient has normal p.o. intake though he has lost 4 pounds in about 2 weeks. He denies any change in bowel/urination habits or change in urine color.   He reports that once he received high-dose Prednisone while previously hospitalized, his splenic pain had improved within 30 minutes.   Patient's wife reports that they are considering traveling [place], [place] in July 2024.  MEDICAL HISTORY:  Past Medical History:  Diagnosis Date   Arthritis of right knee    Cervicalgia    Chest discomfort    normal stress echo   Chest pain    Colon polyps    Dyslipidemia    Fluttering sensation of heart    GERD without esophagitis    Hematochezia    Hyperglycemia    Hyperlipidemia    Internal hemorrhoids    Kidney stones    Male erectile dysfunction, unspecified    Mixed dyslipidemia    Mixed hyperlipidemia    Ocular migraine    PAC (premature atrial contraction)    Personal history of colonic polyps    Prediabetes    Residual hemorrhoidal skin tags    Spleen enlarged    nov 2023 hospitalized   Unspecified hemorrhoids     SURGICAL HISTORY: Past Surgical History:  Procedure Laterality Date   BONE MARROW BIOPSY     IR IMAGING GUIDED PORT INSERTION   09/14/2022   KIDNEY STONE SURGERY     retrieval    SOCIAL HISTORY: Social History   Socioeconomic History   Marital status: Married    Spouse name: Not on file   Number of children: Not on file   Years of education: Not on file   Highest education level: Not on file  Occupational History   Not on file  Tobacco Use   Smoking status: Former    Types: Cigarettes   Smokeless tobacco: Never  Substance and Sexual Activity   Alcohol use: Yes    Alcohol/week: 1.0 standard drink of alcohol    Types: 1 Cans of beer per week   Drug use: Not on file   Sexual activity: Not on file  Other Topics Concern   Not on file  Social History Narrative   Not on file   Social Determinants of Health   Financial Resource Strain: Not on file  Food Insecurity: Unknown (08/31/2022)   Hunger Vital Sign    Worried About Running Out of Food in the Last Year: Never true    Ran Out of Food in the Last Year: Not on file  Transportation Needs: No Transportation Needs (08/31/2022)   PRAPARE - Administrator, Civil Service (Medical): No    Lack of Transportation (Non-Medical): No  Physical Activity: Not on file  Stress: Not on file  Social Connections: Not on file  Intimate Partner Violence: Not on file    FAMILY HISTORY: Family History  Problem Relation Age of Onset   Stroke Brother    Congestive Heart Failure Brother     ALLERGIES:  has No Known Allergies.  MEDICATIONS:  Current Outpatient Medications  Medication Sig Dispense Refill   ALFUZOSIN HCL ER PO Take 1 tablet by mouth daily after supper.     aspirin EC 81 MG tablet Take 81 mg by mouth daily. Swallow whole.     B Complex Vitamins (B COMPLEX PO) Take 1 tablet by mouth daily.     Cholecalciferol (VITAMIN D3) 50 MCG (2000 UT) TABS Take 2,000 Units by mouth daily.     docusate sodium (COLACE) 100 MG capsule Take 100 mg by mouth 2 (two) times daily.     FIBER PO Take 1 capsule by mouth daily. Unknown strength     fish  oil-omega-3 fatty acids 1000 MG capsule Take 1 g by mouth 2 (two) times daily. Strength 300mg /1500mg      Flaxseed, Linseed, (FLAXSEED OIL) 1000 MG CAPS Take 1,000 mg by mouth daily.     GLUCOSAMINE CHONDROITIN MSM PO Take 1 tablet by mouth daily. Strength 1500/1500     lidocaine-prilocaine (EMLA) cream Apply to affected area once (Patient taking differently: Apply 1 Application topically once. Apply to affected area once) 30 g 3   Misc Natural Products (PROSTATE SUPPORT PO) Take 1 capsule by mouth daily. Unknown strength     Multiple Vitamin (MULTIVITAMIN) tablet Take 1 tablet by mouth daily. Unknown strength     ondansetron (ZOFRAN) 8 MG tablet Take 1 tablet (8 mg total) by mouth every 8 (eight) hours as needed for nausea or vomiting. 30 tablet 1   prochlorperazine (COMPAZINE) 10 MG tablet Take 1 tablet (10 mg total) by mouth every 6 (six) hours as needed for nausea or vomiting. 30 tablet 1   psyllium (METAMUCIL) 58.6 % powder Take 1 packet by mouth daily.     REVLIMID 10 MG capsule Take 1 capsule (10 mg total) by mouth daily. Take for 21 days on, then 7 days off. Repeat every 28 days. 21 capsule 0   senna-docusate (SENNA S) 8.6-50 MG tablet Take 2 tablets by mouth at bedtime. 60 tablet 1   traMADol (ULTRAM) 50 MG tablet Take 50 mg by mouth as needed for moderate pain or severe pain.     Turmeric (QC TUMERIC COMPLEX PO) Take 750 mg by mouth daily.     vitamin C (ASCORBIC ACID) 250 MG tablet Take 500 mg by mouth daily.     No current facility-administered medications for this visit.    REVIEW OF SYSTEMS:    10 Point review of Systems was done is negative except as noted above.   PHYSICAL EXAMINATION: .There were no vitals taken for this visit.   GENERAL:alert, in no acute distress and comfortable SKIN: no acute rashes, no significant lesions EYES: conjunctiva are pink and non-injected, sclera anicteric OROPHARYNX: MMM, no exudates, no oropharyngeal erythema or ulceration NECK: supple,  no JVD LYMPH:  no palpable lymphadenopathy in the cervical, axillary or inguinal regions LUNGS: clear to auscultation b/l with normal respiratory effort HEART: regular rate & rhythm ABDOMEN:  normoactive bowel sounds , non tender, not distended. Extremity: no pedal edema PSYCH: alert & oriented x 3 with fluent speech NEURO: no focal motor/sensory deficits   LABORATORY DATA:  I have reviewed the data as listed .    Latest Ref Rng & Units 04/06/2023  1:18 PM 03/31/2023    8:02 AM 03/24/2023    7:55 AM  CBC  WBC 4.0 - 10.5 K/uL 4.6  4.7  4.5   Hemoglobin 13.0 - 17.0 g/dL 6.3  6.8  7.2   Hematocrit 39.0 - 52.0 % 19.3  21.1  22.4   Platelets 150 - 400 K/uL 336  355  443      Iron/TIBC/Ferritin/ %Sat    Component Value Date/Time   IRON 180 09/30/2022 0924   TIBC 193 (L) 09/30/2022 0924   FERRITIN 1,450 (H) 09/30/2022 0924   IRONPCTSAT 93 (H) 09/30/2022 0924      Latest Ref Rng & Units 04/06/2023    1:18 PM 03/31/2023    8:02 AM 03/21/2023    8:16 AM  CMP  Glucose 70 - 99 mg/dL 161  096  045   BUN 8 - 23 mg/dL 19  21  24    Creatinine 0.61 - 1.24 mg/dL 4.09  8.11  9.14   Sodium 135 - 145 mmol/L 137  136  137   Potassium 3.5 - 5.1 mmol/L 4.3  4.3  4.5   Chloride 98 - 111 mmol/L 106  105  106   CO2 22 - 32 mmol/L 25  26  26    Calcium 8.9 - 10.3 mg/dL 8.2  8.5  8.5   Total Protein 6.5 - 8.1 g/dL 5.5  6.0  5.6   Total Bilirubin 0.3 - 1.2 mg/dL 0.6  0.8  0.7   Alkaline Phos 38 - 126 U/L 56  55  48   AST 15 - 41 U/L 10  11  10    ALT 0 - 44 U/L 16  18  17      Lab Results  Component Value Date   LDH 110 01/04/2022    02/23/2021 BCR ABL    02/23/2021 JAK2   Surgical Pathology  CASE: [id number]  PATIENT: Acie Ontko  Bone Marrow Report  Clinical History: MPN with progressive anemia  (BH)  DIAGNOSIS:   BONE MARROW, ASPIRATE, CLOT, CORE:  -Hypercellular bone marrow with features of myeloid neoplasm  -See comment   PERIPHERAL BLOOD:  -Macrocytic anemia   -Leukocytosis  -Thrombocytosis   COMMENT:   The bone marrow/peripheral blood show persistent involvement by  previously known myeloid neoplasm.  There is a myeloproliferative  component as supported by previous JAK2 positivity.  However, there are  also dyspoietic changes primarily involving the megakaryocytic cell line  with numerous hypolobated/unilobated forms in addition to  dysgranulopoiesis to a lesser extent.  This is associated with  eosinophilia.  It is not entirely clear whether the overall findings  represent a myeloproliferative neoplasm with treatment related changes  or represent a primary myeloproliferative/myelodysplastic neoplasm  including but not limited to myeloid neoplasms with eosinophilia.  Correlation with cytogenetic and FISH studies strongly recommended.      RADIOGRAPHIC STUDIES: I have personally reviewed the radiological images as listed and agreed with the findings in the report. US Abdomen Complete  Result Date: 03/19/2023 CLINICAL DATA:  Evaluate spleen size EXAM: ABDOMEN ULTRASOUND COMPLETE COMPARISON:  Abdominal ultrasound April 03, 2021 FINDINGS: Gallbladder: Mild sludge in the gallbladder. The gallbladder is otherwise normal. No Murphy's sign. Common bile duct: Diameter: 7.7 mm today versus 7.6 mm April 03, 2021 Liver: Mild increased echogenicity. No mass. Portal vein is patent on color Doppler imaging with normal direction of blood flow towards the liver. IVC: No abnormality visualized. Pancreas: Visualized portion unremarkable. Spleen: The spleen measures 22.9 by 21.9 x  11.1 cm. Right Kidney: Length: 10.5 cm. Echogenicity within normal limits. No mass or hydronephrosis visualized. Left Kidney: Length: 9.2 cm. Echogenicity within normal limits. No mass or hydronephrosis visualized. Abdominal aorta: No aneurysm visualized. Other findings: None. IMPRESSION: 1. The spleen remains enlarged measuring 22.9 x 21.9 x 11.1 cm with a volume of 2895 cc. These findings  are consistent with splenomegaly. The maximum measurement of the spleen on the previous study was 14.6 cm. There has been significant interval enlargement. 2. Mild sludge in the gallbladder. The gallbladder is otherwise normal. 3. The common bile duct is mildly dilated measuring 7.7 mm today versus 7.6 mm April 03, 2021. Stability is reassuring. Recommend correlation with LFTs. 4. Diffuse increased echogenicity throughout the liver is nonspecific and may be seen with hepatic steatosis or other underlying intrinsic liver disease. Electronically Signed   By: Gerome Sam III M.D.   On: 03/19/2023 11:48     IR IMAGING GUIDED PORT INSERTION  Result Date: 09/14/2022 INDICATION: Poor IV access.  Myelodysplastic syndrome EXAM: IMPLANTED PORT A CATH PLACEMENT WITH ULTRASOUND AND FLUOROSCOPIC GUIDANCE MEDICATIONS: None ANESTHESIA/SEDATION: Moderate (conscious) sedation was employed during this procedure. A total of Versed 3 mg and Fentanyl 100 mcg was administered intravenously. Moderate Sedation Time: 20 minutes. The patient's level of consciousness and vital signs were monitored continuously by radiology nursing throughout the procedure under my direct supervision. FLUOROSCOPY TIME:  Fluoroscopic dose; 1 mGy COMPLICATIONS: None immediate. PROCEDURE: The procedure, risks, benefits, and alternatives were explained to the patient. Questions regarding the procedure were encouraged and answered. The patient understands and consents to the procedure. The RIGHT neck and chest were prepped with chlorhexidine in a sterile fashion, and a sterile drape was applied covering the operative field. Maximum barrier sterile technique with sterile gowns and gloves were used for the procedure. A timeout was performed prior to the initiation of the procedure. Local anesthesia was provided with 1% lidocaine with epinephrine. After creating a small venotomy incision, a micropuncture kit was utilized to access the internal jugular vein  under direct, real-time ultrasound guidance. Ultrasound image documentation was performed. The microwire was kinked to measure appropriate catheter length. A subcutaneous port pocket was then created along the upper chest wall utilizing a combination of sharp and blunt dissection. The pocket was irrigated with sterile saline. A single lumen ISP power injectable port was chosen for placement. The 8 Fr catheter was tunneled from the port pocket site to the venotomy incision. The port was placed in the pocket. The external catheter was trimmed to appropriate length. At the venotomy, an 8 Fr peel-away sheath was placed over a guidewire under fluoroscopic guidance. The catheter was then placed through the sheath and the sheath was removed. Final catheter positioning was confirmed and documented with a fluoroscopic spot radiograph. The port was accessed with a Huber needle, aspirated and flushed with heparinized saline. The port pocket incision was closed with interrupted 3-0 Vicryl suture then Dermabond was applied, including at the venotomy incision. Dressings were placed. The patient tolerated the procedure well without immediate post procedural complication. IMPRESSION: Successful placement of a RIGHT internal jugular approach power injectable Port-A-Cath. The tip of the catheter is positioned within the proximal RIGHT atrium. The catheter is ready for immediate use. Roanna Banning, MD [facility] Radiology Electronically Signed   By: Roanna Banning M.D.   On: 09/14/2022 17:18   CT ANGIO GI BLEED  Result Date: 08/31/2022 CLINICAL DATA:  Acute mesenteric ischemia. Abdominal pain. History  of myelodysplastic syndrome. EXAM: CTA ABDOMEN AND PELVIS WITHOUT AND WITH CONTRAST TECHNIQUE: Multidetector CT imaging of the abdomen and pelvis was performed using the standard protocol during bolus administration of intravenous contrast. Multiplanar reconstructed images and MIPs were  obtained and reviewed to evaluate the vascular anatomy. RADIATION DOSE REDUCTION: This exam was performed according to the departmental dose-optimization program which includes automated exposure control, adjustment of the mA and/or kV according to patient size and/or use of iterative reconstruction technique. CONTRAST:  OMNIPAQUE IOHEXOL 350 MG/ML SOLN COMPARISON:  Abdominal ultrasound examination 04/03/2021 FINDINGS: VASCULAR Aorta: Normal caliber abdominal aorta. Scattered atherosclerotic calcifications. No dissection. Celiac: Normal SMA: Normal Renals: Normal IMA: Patent Inflow: Normal Proximal Outflow: Normal Veins: Normal Review of the MIP images confirms the above findings. NON-VASCULAR Lower chest: Small left pleural effusion and bibasilar atelectasis. The heart is normal in size. No pericardial effusion. There is a moderate to large hiatal hernia. Hepatobiliary: No hepatic lesions or intrahepatic biliary dilatation. The gallbladder is unremarkable. No common bile duct dilatation. Pancreas: No mass, inflammation or ductal dilatation. Spleen: Massive splenomegaly. The spleen measures 20 x 16 x 11 cm. No splenic lesions or splenic infarct. Adrenals/Urinary Tract: The left kidney is displaced medially and inferiorly by the enlarged spleen. No worrisome renal lesions,, hydronephrosis or pyelonephritis. There is a lower pole right renal calculus and there are 2 distal left ureteral calculi more proximal calculus measures 4 mm and the more distal calculus measures 5 mm. I do not see any hydroureter or hydronephrosis. Stomach/Bowel: The stomach, duodenum, small bowel and colon are grossly normal. No inflammatory changes, mass lesions or obstructive findings. Lymphatic: No abdominal or pelvic lymphadenopathy. Reproductive: The prostate gland is enlarged. The seminal vesicles are unremarkable. Other: No pelvic mass or adenopathy. No free pelvic fluid collections. No inguinal mass or adenopathy. No abdominal  wall hernia or subcutaneous lesions. Musculoskeletal: No significant bony findings. IMPRESSION: 1. Normal caliber abdominal aorta and no dissection. The branch vessels are normal. 2. Splenomegaly. 3. Two distal left ureteral calculi but no hydroureter or hydronephrosis. 4. Lower pole right renal calculus. 5. Moderate to large hiatal hernia. 6. Small left pleural effusion and bibasilar atelectasis. Electronically Signed   By: Rudie Meyer M.D.   On: 08/31/2022 18:13     ASSESSMENT & PLAN:    #1 JAK2-positive myeloproliferative neoplasm-primarily presenting with thrombocytosis and associated MDS causing refractory anemia -No polycythemia.  -Mild leukocytosis.  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 Has grade 1 out of 3 reticulin fibrosis.  Uncertain if this is primary or secondary. -LDH has remained within normal limits. 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 -CT Angio GI bleed scan from 09/01/2022 showed kidney stones.and significant splenomegaly  PLAN:  -Discussed lab results on 04/06/2023 in detail with patient. CBC showed WBC of 4.6K, hemoglobin of 6.3, and platelets of 336K. -reasonable to proceed with 10 MG Revlimid 3 weeks on 1 week off for MDS to reduce risk for transfusion supports. (As recommended by Dr Lowella Bandy from [facility]) -discussed potential risks with Rituxan such as suppressing blood counts or blood clots -Educated the patient on JAK-2 inhibitor treatment with transfusion and discussed other treatment options. Discussed the side effects of JAK-2 inhibitor.  -Continue blood transfusion as needed every Thursday.  -continue Aspirin to reduce risk of blood clots in setting of high platelets -informed patient that JAK 2 mutation may increase the risk of blood clots -discussed that there may be a role for preventative dose of blood thinners such as  Eliquis -will order small dose Prednisone to improve antibiotic buildup improve energy levels,  address antibody mediated destruction of red blood cells, or phagocyte activity -discussed the primary issue of bone marrow not producing enough red cells which would not be addressed by Jak 2 inhibitor -patient does require 2 units of transfusion support   Follow-up:  -continue weekly labs and PRBC transfusion support x 8 -RTC with Dr Candise Che in 3 weeks for toxicity check    The total time spent in the appointment was 25 minutes* .  All of the patient's questions were answered with apparent satisfaction. The patient knows to call the clinic with any problems, questions or concerns.   Wyvonnia Lora MD MS AAHIVMS [facility] Hematology/Oncology Physician [facility]  .*Total Encounter Time as defined by the Centers for Medicare and Medicaid Services includes, in addition to the face-to-face time of a patient visit (documented in the note above) non-face-to-face time: obtaining and reviewing outside history, ordering and reviewing medications, tests or procedures, care coordination (communications with other health care professionals or caregivers) and documentation in the medical record.    I,Mitra Faeizi,acting as a Neurosurgeon for Wyvonnia Lora, MD.,have documented all relevant documentation on the behalf of Wyvonnia Lora, MD,as directed by  Wyvonnia Lora, MD while in the presence of Wyvonnia Lora, MD.  .I have reviewed the above documentation for accuracy and completeness, and I agree with the above. Johney Maine MD

## 2023-04-06 NOTE — Telephone Encounter (Signed)
CRITICAL VALUE STICKER  CRITICAL VALUE:   Hgb 6.3  RECEIVER (on-site recipient of call):Alexsia Klindt, LPN  DATE & TIME NOTIFIED: 04/06/23  13:42  MESSENGER (representative from lab): Pam  MD NOTIFIED: Wyvonnia Lora, MD  TIME OF NOTIFICATION: 13:45  RESPONSE:

## 2023-04-07 ENCOUNTER — Inpatient Hospital Stay: Payer: Medicare Other

## 2023-04-07 ENCOUNTER — Other Ambulatory Visit: Payer: Self-pay

## 2023-04-07 DIAGNOSIS — Z7961 Long term (current) use of immunomodulator: Secondary | ICD-10-CM | POA: Diagnosis not present

## 2023-04-07 DIAGNOSIS — Z86010 Personal history of colonic polyps: Secondary | ICD-10-CM | POA: Diagnosis not present

## 2023-04-07 DIAGNOSIS — D75839 Thrombocytosis, unspecified: Secondary | ICD-10-CM | POA: Diagnosis not present

## 2023-04-07 DIAGNOSIS — R161 Splenomegaly, not elsewhere classified: Secondary | ICD-10-CM | POA: Diagnosis not present

## 2023-04-07 DIAGNOSIS — D469 Myelodysplastic syndrome, unspecified: Secondary | ICD-10-CM

## 2023-04-07 DIAGNOSIS — R5383 Other fatigue: Secondary | ICD-10-CM | POA: Diagnosis not present

## 2023-04-07 DIAGNOSIS — K449 Diaphragmatic hernia without obstruction or gangrene: Secondary | ICD-10-CM | POA: Diagnosis not present

## 2023-04-07 DIAGNOSIS — J90 Pleural effusion, not elsewhere classified: Secondary | ICD-10-CM | POA: Diagnosis not present

## 2023-04-07 DIAGNOSIS — K219 Gastro-esophageal reflux disease without esophagitis: Secondary | ICD-10-CM | POA: Diagnosis not present

## 2023-04-07 DIAGNOSIS — Z8719 Personal history of other diseases of the digestive system: Secondary | ICD-10-CM | POA: Diagnosis not present

## 2023-04-07 DIAGNOSIS — E782 Mixed hyperlipidemia: Secondary | ICD-10-CM | POA: Diagnosis not present

## 2023-04-07 DIAGNOSIS — Z7982 Long term (current) use of aspirin: Secondary | ICD-10-CM | POA: Diagnosis not present

## 2023-04-07 DIAGNOSIS — D539 Nutritional anemia, unspecified: Secondary | ICD-10-CM | POA: Diagnosis not present

## 2023-04-07 DIAGNOSIS — M1711 Unilateral primary osteoarthritis, right knee: Secondary | ICD-10-CM | POA: Diagnosis not present

## 2023-04-07 DIAGNOSIS — R61 Generalized hyperhidrosis: Secondary | ICD-10-CM | POA: Diagnosis not present

## 2023-04-07 DIAGNOSIS — Z87891 Personal history of nicotine dependence: Secondary | ICD-10-CM | POA: Diagnosis not present

## 2023-04-07 DIAGNOSIS — D464 Refractory anemia, unspecified: Secondary | ICD-10-CM | POA: Diagnosis not present

## 2023-04-07 DIAGNOSIS — N202 Calculus of kidney with calculus of ureter: Secondary | ICD-10-CM | POA: Diagnosis not present

## 2023-04-07 DIAGNOSIS — E785 Hyperlipidemia, unspecified: Secondary | ICD-10-CM | POA: Diagnosis not present

## 2023-04-07 DIAGNOSIS — R739 Hyperglycemia, unspecified: Secondary | ICD-10-CM | POA: Diagnosis not present

## 2023-04-07 DIAGNOSIS — Z79899 Other long term (current) drug therapy: Secondary | ICD-10-CM | POA: Diagnosis not present

## 2023-04-07 DIAGNOSIS — K828 Other specified diseases of gallbladder: Secondary | ICD-10-CM | POA: Diagnosis not present

## 2023-04-07 LAB — TYPE AND SCREEN

## 2023-04-07 LAB — BPAM RBC
Blood Product Expiration Date: 202407112359
Unit Type and Rh: 6200

## 2023-04-07 MED ORDER — SODIUM CHLORIDE 0.9% FLUSH
10.0000 mL | INTRAVENOUS | Status: AC | PRN
  Administered 2023-04-07: 10 mL

## 2023-04-07 MED ORDER — SODIUM CHLORIDE 0.9% IV SOLUTION
250.0000 mL | Freq: Once | INTRAVENOUS | Status: AC
  Administered 2023-04-07: 250 mL via INTRAVENOUS

## 2023-04-07 MED ORDER — METHYLPREDNISOLONE SODIUM SUCC 40 MG IJ SOLR
40.0000 mg | Freq: Once | INTRAMUSCULAR | Status: AC
  Administered 2023-04-07: 40 mg via INTRAVENOUS
  Filled 2023-04-07: qty 1

## 2023-04-07 MED ORDER — ACETAMINOPHEN 325 MG PO TABS
650.0000 mg | ORAL_TABLET | Freq: Once | ORAL | Status: AC
  Administered 2023-04-07: 650 mg via ORAL
  Filled 2023-04-07: qty 2

## 2023-04-07 MED ORDER — HEPARIN SOD (PORK) LOCK FLUSH 100 UNIT/ML IV SOLN
500.0000 [IU] | Freq: Every day | INTRAVENOUS | Status: AC | PRN
  Administered 2023-04-07: 500 [IU]

## 2023-04-07 NOTE — Patient Instructions (Signed)
Blood Transfusion, Adult, Care After The following information offers guidance on how to care for yourself after your procedure. Your health care provider may also give you more specific instructions. If you have problems or questions, contact your health care provider. What can I expect after the procedure? After the procedure, it is common to have: Bruising and soreness where the IV was inserted. A headache. Follow these instructions at home: IV insertion site care     Follow instructions from your health care provider about how to take care of your IV insertion site. Make sure you: Wash your hands with soap and water for at least 20 seconds before and after you change your bandage (dressing). If soap and water are not available, use hand sanitizer. Change your dressing as told by your health care provider. Check your IV insertion site every day for signs of infection. Check for: Redness, swelling, or pain. Bleeding from the site. Warmth. Pus or a bad smell. General instructions Take over-the-counter and prescription medicines only as told by your health care provider. Rest as told by your health care provider. Return to your normal activities as told by your health care provider. Keep all follow-up visits. Lab tests may need to be done at certain periods to recheck your blood counts. Contact a health care provider if: You have itching or red, swollen areas of skin (hives). You have a fever or chills. You have pain in the head, back, or chest. You feel anxious or you feel weak after doing your normal activities. You have redness, swelling, warmth, or pain around the IV insertion site. You have blood coming from the IV insertion site that does not stop with pressure. You have pus or a bad smell coming from your IV insertion site. If you received your blood transfusion in an outpatient setting, you will be told whom to contact to report any reactions. Get help right away if: You  have symptoms of a serious allergic or immune system reaction, including: Trouble breathing or shortness of breath. Swelling of the face, feeling flushed, or widespread rash. Dark urine or blood in the urine. Fast heartbeat. These symptoms may be an emergency. Get help right away. Call 911. Do not wait to see if the symptoms will go away. Do not drive yourself to the hospital. Summary Bruising and soreness around the IV insertion site are common. Check your IV insertion site every day for signs of infection. Rest as told by your health care provider. Return to your normal activities as told by your health care provider. Get help right away for symptoms of a serious allergic or immune system reaction to the blood transfusion. This information is not intended to replace advice given to you by your health care provider. Make sure you discuss any questions you have with your health care provider. Document Revised: 01/01/2022 Document Reviewed: 01/01/2022 Elsevier Patient Education  2024 Elsevier Inc.  

## 2023-04-08 LAB — BPAM RBC: Unit Type and Rh: 6200

## 2023-04-08 LAB — TYPE AND SCREEN
ABO/RH(D): A POS
Antibody Screen: NEGATIVE
Unit division: 0
Unit division: 0

## 2023-04-08 NOTE — Telephone Encounter (Signed)
Oral Chemotherapy Pharmacist Encounter  I spoke with patient for overview of: Revlimid (lenalidomide) for the treatment of MDS/MPN, planned duration until disease progression or unacceptable drug toxicity.   Counseled patient on administration, dosing, side effects, monitoring, drug-food interactions, safe handling, storage, and disposal.  Patient will take Revlimid 10mg  capsules, 1 capsule by mouth once daily, without regard to food, with a full glass of water.  Revlimid will be given 21 days on, 7 days off, repeat every 28 days.  Revlimid start date: 04/07/23  Adverse effects of Revlimid include but are not limited to: nausea, GI upset, rash, fatigue, and decreased blood counts.    Reviewed with patient importance of keeping a medication schedule and plan for any missed doses. No barriers to medication adherence identified.  Medication reconciliation performed and medication/allergy list updated.  Insurance authorization for Revlimid has been obtained.  Revlimid prescription is being dispensed from [facility] as it is a limited distribution medication.  All questions answered.  Mr. Vitelli voiced understanding and appreciation.   Medication education handout given to patient in clinic on 04/06/23. Patient knows to call the office with questions or concerns. Oral Chemotherapy Clinic phone number provided to patient.   Lenord Carbo, PharmD, BCPS, [facility] Hematology/Oncology Clinical Pharmacist [facility] Oral Chemotherapy Navigation Clinics [phone] 04/08/2023 9:47 AM

## 2023-04-12 ENCOUNTER — Encounter: Payer: Self-pay | Admitting: Hematology

## 2023-04-13 ENCOUNTER — Other Ambulatory Visit: Payer: Self-pay

## 2023-04-13 DIAGNOSIS — D469 Myelodysplastic syndrome, unspecified: Secondary | ICD-10-CM

## 2023-04-14 ENCOUNTER — Other Ambulatory Visit: Payer: Self-pay

## 2023-04-14 ENCOUNTER — Inpatient Hospital Stay: Payer: Medicare Other

## 2023-04-14 DIAGNOSIS — Z7982 Long term (current) use of aspirin: Secondary | ICD-10-CM | POA: Diagnosis not present

## 2023-04-14 DIAGNOSIS — R61 Generalized hyperhidrosis: Secondary | ICD-10-CM | POA: Diagnosis not present

## 2023-04-14 DIAGNOSIS — E782 Mixed hyperlipidemia: Secondary | ICD-10-CM | POA: Diagnosis not present

## 2023-04-14 DIAGNOSIS — J90 Pleural effusion, not elsewhere classified: Secondary | ICD-10-CM | POA: Diagnosis not present

## 2023-04-14 DIAGNOSIS — D469 Myelodysplastic syndrome, unspecified: Secondary | ICD-10-CM | POA: Diagnosis not present

## 2023-04-14 DIAGNOSIS — R5383 Other fatigue: Secondary | ICD-10-CM | POA: Diagnosis not present

## 2023-04-14 DIAGNOSIS — E785 Hyperlipidemia, unspecified: Secondary | ICD-10-CM | POA: Diagnosis not present

## 2023-04-14 DIAGNOSIS — D75839 Thrombocytosis, unspecified: Secondary | ICD-10-CM | POA: Diagnosis not present

## 2023-04-14 DIAGNOSIS — R161 Splenomegaly, not elsewhere classified: Secondary | ICD-10-CM | POA: Diagnosis not present

## 2023-04-14 DIAGNOSIS — R739 Hyperglycemia, unspecified: Secondary | ICD-10-CM | POA: Diagnosis not present

## 2023-04-14 DIAGNOSIS — M1711 Unilateral primary osteoarthritis, right knee: Secondary | ICD-10-CM | POA: Diagnosis not present

## 2023-04-14 DIAGNOSIS — K828 Other specified diseases of gallbladder: Secondary | ICD-10-CM | POA: Diagnosis not present

## 2023-04-14 DIAGNOSIS — Z8719 Personal history of other diseases of the digestive system: Secondary | ICD-10-CM | POA: Diagnosis not present

## 2023-04-14 DIAGNOSIS — Z87891 Personal history of nicotine dependence: Secondary | ICD-10-CM | POA: Diagnosis not present

## 2023-04-14 DIAGNOSIS — N202 Calculus of kidney with calculus of ureter: Secondary | ICD-10-CM | POA: Diagnosis not present

## 2023-04-14 DIAGNOSIS — D464 Refractory anemia, unspecified: Secondary | ICD-10-CM | POA: Diagnosis not present

## 2023-04-14 DIAGNOSIS — Z86010 Personal history of colonic polyps: Secondary | ICD-10-CM | POA: Diagnosis not present

## 2023-04-14 DIAGNOSIS — K449 Diaphragmatic hernia without obstruction or gangrene: Secondary | ICD-10-CM | POA: Diagnosis not present

## 2023-04-14 DIAGNOSIS — Z7961 Long term (current) use of immunomodulator: Secondary | ICD-10-CM | POA: Diagnosis not present

## 2023-04-14 DIAGNOSIS — D539 Nutritional anemia, unspecified: Secondary | ICD-10-CM | POA: Diagnosis not present

## 2023-04-14 DIAGNOSIS — K219 Gastro-esophageal reflux disease without esophagitis: Secondary | ICD-10-CM | POA: Diagnosis not present

## 2023-04-14 DIAGNOSIS — Z79899 Other long term (current) drug therapy: Secondary | ICD-10-CM | POA: Diagnosis not present

## 2023-04-14 LAB — CBC WITH DIFFERENTIAL (CANCER CENTER ONLY)
Abs Immature Granulocytes: 0.68 10*3/uL — ABNORMAL HIGH (ref 0.00–0.07)
Basophils Absolute: 0.1 10*3/uL (ref 0.0–0.1)
Basophils Relative: 1 %
Eosinophils Absolute: 0.8 10*3/uL — ABNORMAL HIGH (ref 0.0–0.5)
Eosinophils Relative: 8 %
HCT: 21.6 % — ABNORMAL LOW (ref 39.0–52.0)
Hemoglobin: 7 g/dL — ABNORMAL LOW (ref 13.0–17.0)
Immature Granulocytes: 7 %
Lymphocytes Relative: 4 %
Lymphs Abs: 0.4 10*3/uL — ABNORMAL LOW (ref 0.7–4.0)
MCH: 28.7 pg (ref 26.0–34.0)
MCHC: 32.4 g/dL (ref 30.0–36.0)
MCV: 88.5 fL (ref 80.0–100.0)
Monocytes Absolute: 0.4 10*3/uL (ref 0.1–1.0)
Monocytes Relative: 4 %
Neutro Abs: 6.9 10*3/uL (ref 1.7–7.7)
Neutrophils Relative %: 76 %
Platelet Count: 394 10*3/uL (ref 150–400)
RBC: 2.44 MIL/uL — ABNORMAL LOW (ref 4.22–5.81)
RDW: 15.2 % (ref 11.5–15.5)
WBC Count: 9.2 10*3/uL (ref 4.0–10.5)
nRBC: 0 % (ref 0.0–0.2)

## 2023-04-14 LAB — CMP (CANCER CENTER ONLY)
ALT: 26 U/L (ref 0–44)
AST: 11 U/L — ABNORMAL LOW (ref 15–41)
Albumin: 3.6 g/dL (ref 3.5–5.0)
Alkaline Phosphatase: 47 U/L (ref 38–126)
Anion gap: 7 (ref 5–15)
BUN: 20 mg/dL (ref 8–23)
CO2: 26 mmol/L (ref 22–32)
Calcium: 8.4 mg/dL — ABNORMAL LOW (ref 8.9–10.3)
Chloride: 103 mmol/L (ref 98–111)
Creatinine: 0.86 mg/dL (ref 0.61–1.24)
GFR, Estimated: >60 mL/min (ref >60)
Glucose, Bld: 221 mg/dL — ABNORMAL HIGH (ref 70–99)
Potassium: 4.1 mmol/L (ref 3.5–5.1)
Sodium: 136 mmol/L (ref 135–145)
Total Bilirubin: 0.9 mg/dL (ref 0.3–1.2)
Total Protein: 5.7 g/dL — ABNORMAL LOW (ref 6.5–8.1)

## 2023-04-14 LAB — SAMPLE TO BLOOD BANK

## 2023-04-14 LAB — BPAM RBC: Blood Product Expiration Date: 202407152359

## 2023-04-14 LAB — TYPE AND SCREEN: Unit division: 0

## 2023-04-14 LAB — PREPARE RBC (CROSSMATCH)

## 2023-04-14 MED ORDER — ACETAMINOPHEN 325 MG PO TABS
650.0000 mg | ORAL_TABLET | Freq: Once | ORAL | Status: AC
  Administered 2023-04-14: 650 mg via ORAL
  Filled 2023-04-14: qty 2

## 2023-04-14 MED ORDER — SODIUM CHLORIDE 0.9% IV SOLUTION
250.0000 mL | Freq: Once | INTRAVENOUS | Status: AC
  Administered 2023-04-14: 250 mL via INTRAVENOUS

## 2023-04-14 MED ORDER — SODIUM CHLORIDE 0.9% FLUSH
10.0000 mL | Freq: Once | INTRAVENOUS | Status: AC
  Administered 2023-04-14: 10 mL

## 2023-04-14 MED ORDER — HEPARIN SOD (PORK) LOCK FLUSH 100 UNIT/ML IV SOLN
500.0000 [IU] | Freq: Every day | INTRAVENOUS | Status: AC | PRN
  Administered 2023-04-14: 500 [IU]

## 2023-04-14 MED ORDER — SODIUM CHLORIDE 0.9% FLUSH
10.0000 mL | INTRAVENOUS | Status: AC | PRN
  Administered 2023-04-14: 10 mL

## 2023-04-14 MED ORDER — METHYLPREDNISOLONE SODIUM SUCC 40 MG IJ SOLR
40.0000 mg | Freq: Once | INTRAMUSCULAR | Status: AC
  Administered 2023-04-14: 40 mg via INTRAVENOUS
  Filled 2023-04-14: qty 1

## 2023-04-14 NOTE — Patient Instructions (Signed)
Blood Transfusion, Adult, Care After The following information offers guidance on how to care for yourself after your procedure. Your health care provider may also give you more specific instructions. If you have problems or questions, contact your health care provider. What can I expect after the procedure? After the procedure, it is common to have: Bruising and soreness where the IV was inserted. A headache. Follow these instructions at home: IV insertion site care     Follow instructions from your health care provider about how to take care of your IV insertion site. Make sure you: Wash your hands with soap and water for at least 20 seconds before and after you change your bandage (dressing). If soap and water are not available, use hand sanitizer. Change your dressing as told by your health care provider. Check your IV insertion site every day for signs of infection. Check for: Redness, swelling, or pain. Bleeding from the site. Warmth. Pus or a bad smell. General instructions Take over-the-counter and prescription medicines only as told by your health care provider. Rest as told by your health care provider. Return to your normal activities as told by your health care provider. Keep all follow-up visits. Lab tests may need to be done at certain periods to recheck your blood counts. Contact a health care provider if: You have itching or red, swollen areas of skin (hives). You have a fever or chills. You have pain in the head, back, or chest. You feel anxious or you feel weak after doing your normal activities. You have redness, swelling, warmth, or pain around the IV insertion site. You have blood coming from the IV insertion site that does not stop with pressure. You have pus or a bad smell coming from your IV insertion site. If you received your blood transfusion in an outpatient setting, you will be told whom to contact to report any reactions. Get help right away if: You  have symptoms of a serious allergic or immune system reaction, including: Trouble breathing or shortness of breath. Swelling of the face, feeling flushed, or widespread rash. Dark urine or blood in the urine. Fast heartbeat. These symptoms may be an emergency. Get help right away. Call 911. Do not wait to see if the symptoms will go away. Do not drive yourself to the hospital. Summary Bruising and soreness around the IV insertion site are common. Check your IV insertion site every day for signs of infection. Rest as told by your health care provider. Return to your normal activities as told by your health care provider. Get help right away for symptoms of a serious allergic or immune system reaction to the blood transfusion. This information is not intended to replace advice given to you by your health care provider. Make sure you discuss any questions you have with your health care provider. Document Revised: 01/01/2022 Document Reviewed: 01/01/2022 Elsevier Patient Education  2024 Elsevier Inc.  

## 2023-04-15 LAB — TYPE AND SCREEN
ABO/RH(D): A POS
Antibody Screen: NEGATIVE

## 2023-04-15 LAB — BPAM RBC
ISSUE DATE / TIME: 202406271055
Unit Type and Rh: 6200

## 2023-04-16 ENCOUNTER — Other Ambulatory Visit: Payer: Self-pay

## 2023-04-18 ENCOUNTER — Telehealth: Payer: Self-pay | Admitting: Hematology

## 2023-04-19 ENCOUNTER — Other Ambulatory Visit: Payer: Self-pay

## 2023-04-19 DIAGNOSIS — D469 Myelodysplastic syndrome, unspecified: Secondary | ICD-10-CM

## 2023-04-20 ENCOUNTER — Other Ambulatory Visit: Payer: Self-pay

## 2023-04-20 ENCOUNTER — Inpatient Hospital Stay: Payer: Medicare Other

## 2023-04-20 ENCOUNTER — Inpatient Hospital Stay: Payer: Medicare Other | Attending: Hematology

## 2023-04-20 DIAGNOSIS — D469 Myelodysplastic syndrome, unspecified: Secondary | ICD-10-CM

## 2023-04-20 DIAGNOSIS — Z87891 Personal history of nicotine dependence: Secondary | ICD-10-CM | POA: Diagnosis not present

## 2023-04-20 DIAGNOSIS — Z7982 Long term (current) use of aspirin: Secondary | ICD-10-CM | POA: Insufficient documentation

## 2023-04-20 DIAGNOSIS — D464 Refractory anemia, unspecified: Secondary | ICD-10-CM | POA: Insufficient documentation

## 2023-04-20 DIAGNOSIS — R5383 Other fatigue: Secondary | ICD-10-CM | POA: Insufficient documentation

## 2023-04-20 DIAGNOSIS — M109 Gout, unspecified: Secondary | ICD-10-CM | POA: Diagnosis not present

## 2023-04-20 DIAGNOSIS — N202 Calculus of kidney with calculus of ureter: Secondary | ICD-10-CM | POA: Diagnosis not present

## 2023-04-20 DIAGNOSIS — R109 Unspecified abdominal pain: Secondary | ICD-10-CM | POA: Diagnosis not present

## 2023-04-20 DIAGNOSIS — D72829 Elevated white blood cell count, unspecified: Secondary | ICD-10-CM | POA: Insufficient documentation

## 2023-04-20 DIAGNOSIS — Z86010 Personal history of colonic polyps: Secondary | ICD-10-CM | POA: Insufficient documentation

## 2023-04-20 DIAGNOSIS — N400 Enlarged prostate without lower urinary tract symptoms: Secondary | ICD-10-CM | POA: Insufficient documentation

## 2023-04-20 DIAGNOSIS — J90 Pleural effusion, not elsewhere classified: Secondary | ICD-10-CM | POA: Insufficient documentation

## 2023-04-20 DIAGNOSIS — D696 Thrombocytopenia, unspecified: Secondary | ICD-10-CM | POA: Diagnosis not present

## 2023-04-20 DIAGNOSIS — K219 Gastro-esophageal reflux disease without esophagitis: Secondary | ICD-10-CM | POA: Insufficient documentation

## 2023-04-20 DIAGNOSIS — R600 Localized edema: Secondary | ICD-10-CM | POA: Diagnosis not present

## 2023-04-20 DIAGNOSIS — Z79899 Other long term (current) drug therapy: Secondary | ICD-10-CM | POA: Diagnosis not present

## 2023-04-20 DIAGNOSIS — K449 Diaphragmatic hernia without obstruction or gangrene: Secondary | ICD-10-CM | POA: Diagnosis not present

## 2023-04-20 DIAGNOSIS — E785 Hyperlipidemia, unspecified: Secondary | ICD-10-CM | POA: Diagnosis not present

## 2023-04-20 DIAGNOSIS — M79674 Pain in right toe(s): Secondary | ICD-10-CM | POA: Insufficient documentation

## 2023-04-20 DIAGNOSIS — Z7961 Long term (current) use of immunomodulator: Secondary | ICD-10-CM | POA: Insufficient documentation

## 2023-04-20 DIAGNOSIS — R161 Splenomegaly, not elsewhere classified: Secondary | ICD-10-CM | POA: Diagnosis not present

## 2023-04-20 LAB — CBC WITH DIFFERENTIAL (CANCER CENTER ONLY)
Abs Immature Granulocytes: 0.7 10*3/uL — ABNORMAL HIGH (ref 0.00–0.07)
Basophils Absolute: 0.1 10*3/uL (ref 0.0–0.1)
Basophils Relative: 2 %
Eosinophils Absolute: 1 10*3/uL — ABNORMAL HIGH (ref 0.0–0.5)
Eosinophils Relative: 12 %
HCT: 20.2 % — ABNORMAL LOW (ref 39.0–52.0)
Hemoglobin: 6.8 g/dL — CL (ref 13.0–17.0)
Immature Granulocytes: 9 %
Lymphocytes Relative: 4 %
Lymphs Abs: 0.4 10*3/uL — ABNORMAL LOW (ref 0.7–4.0)
MCH: 29.4 pg (ref 26.0–34.0)
MCHC: 33.7 g/dL (ref 30.0–36.0)
MCV: 87.4 fL (ref 80.0–100.0)
Monocytes Absolute: 0.8 10*3/uL (ref 0.1–1.0)
Monocytes Relative: 9 %
Neutro Abs: 5.1 10*3/uL (ref 1.7–7.7)
Neutrophils Relative %: 64 %
Platelet Count: 390 10*3/uL (ref 150–400)
RBC: 2.31 MIL/uL — ABNORMAL LOW (ref 4.22–5.81)
RDW: 14.9 % (ref 11.5–15.5)
Smear Review: NORMAL
WBC Count: 7.9 10*3/uL (ref 4.0–10.5)
nRBC: 0 % (ref 0.0–0.2)

## 2023-04-20 LAB — PREPARE RBC (CROSSMATCH)

## 2023-04-20 LAB — SAMPLE TO BLOOD BANK

## 2023-04-20 LAB — TYPE AND SCREEN: Antibody Screen: NEGATIVE

## 2023-04-20 LAB — BPAM RBC: Blood Product Expiration Date: 202408022359

## 2023-04-20 MED ORDER — HEPARIN SOD (PORK) LOCK FLUSH 100 UNIT/ML IV SOLN
500.0000 [IU] | Freq: Every day | INTRAVENOUS | Status: AC | PRN
  Administered 2023-04-20: 500 [IU]

## 2023-04-20 MED ORDER — SODIUM CHLORIDE 0.9% FLUSH
10.0000 mL | INTRAVENOUS | Status: AC | PRN
  Administered 2023-04-20: 10 mL

## 2023-04-20 MED ORDER — ACETAMINOPHEN 325 MG PO TABS
650.0000 mg | ORAL_TABLET | Freq: Once | ORAL | Status: AC
  Administered 2023-04-20: 650 mg via ORAL
  Filled 2023-04-20: qty 2

## 2023-04-20 MED ORDER — SODIUM CHLORIDE 0.9% FLUSH
10.0000 mL | Freq: Once | INTRAVENOUS | Status: AC
  Administered 2023-04-20: 10 mL

## 2023-04-20 MED ORDER — SODIUM CHLORIDE 0.9% IV SOLUTION
250.0000 mL | Freq: Once | INTRAVENOUS | Status: AC
  Administered 2023-04-20: 250 mL via INTRAVENOUS

## 2023-04-20 MED ORDER — METHYLPREDNISOLONE SODIUM SUCC 40 MG IJ SOLR
40.0000 mg | Freq: Once | INTRAMUSCULAR | Status: AC
  Administered 2023-04-20: 40 mg via INTRAVENOUS
  Filled 2023-04-20: qty 1

## 2023-04-21 LAB — TYPE AND SCREEN
ABO/RH(D): A POS
Unit division: 0

## 2023-04-21 LAB — BPAM RBC
ISSUE DATE / TIME: 202407031127
Unit Type and Rh: 6200

## 2023-04-22 ENCOUNTER — Other Ambulatory Visit: Payer: Self-pay

## 2023-04-22 DIAGNOSIS — D469 Myelodysplastic syndrome, unspecified: Secondary | ICD-10-CM

## 2023-04-23 ENCOUNTER — Emergency Department (HOSPITAL_COMMUNITY): Payer: Medicare Other

## 2023-04-23 ENCOUNTER — Emergency Department (HOSPITAL_COMMUNITY)
Admission: EM | Admit: 2023-04-23 | Discharge: 2023-04-23 | Disposition: A | Discharge status: Home / Self Care | Payer: Medicare Other | Attending: Emergency Medicine | Admitting: Emergency Medicine

## 2023-04-23 DIAGNOSIS — M10071 Idiopathic gout, right ankle and foot: Secondary | ICD-10-CM | POA: Diagnosis not present

## 2023-04-23 DIAGNOSIS — Z7982 Long term (current) use of aspirin: Secondary | ICD-10-CM | POA: Diagnosis not present

## 2023-04-23 DIAGNOSIS — L02611 Cutaneous abscess of right foot: Secondary | ICD-10-CM | POA: Diagnosis not present

## 2023-04-23 DIAGNOSIS — L729 Follicular cyst of the skin and subcutaneous tissue, unspecified: Secondary | ICD-10-CM | POA: Insufficient documentation

## 2023-04-23 DIAGNOSIS — L720 Epidermal cyst: Secondary | ICD-10-CM | POA: Diagnosis not present

## 2023-04-23 DIAGNOSIS — M25561 Pain in right knee: Secondary | ICD-10-CM | POA: Diagnosis not present

## 2023-04-23 DIAGNOSIS — M7989 Other specified soft tissue disorders: Secondary | ICD-10-CM | POA: Diagnosis not present

## 2023-04-23 DIAGNOSIS — M109 Gout, unspecified: Secondary | ICD-10-CM | POA: Diagnosis not present

## 2023-04-23 DIAGNOSIS — L089 Local infection of the skin and subcutaneous tissue, unspecified: Secondary | ICD-10-CM

## 2023-04-23 DIAGNOSIS — M79674 Pain in right toe(s): Secondary | ICD-10-CM | POA: Diagnosis not present

## 2023-04-23 LAB — CBC
HCT: 21.2 % — ABNORMAL LOW (ref 39.0–52.0)
Hemoglobin: 6.9 g/dL — CL (ref 13.0–17.0)
MCH: 29.4 pg (ref 26.0–34.0)
MCHC: 32.5 g/dL (ref 30.0–36.0)
MCV: 90.2 fL (ref 80.0–100.0)
Platelets: 236 10*3/uL (ref 150–400)
RBC: 2.35 MIL/uL — ABNORMAL LOW (ref 4.22–5.81)
RDW: 14.8 % (ref 11.5–15.5)
WBC: 7.2 10*3/uL (ref 4.0–10.5)
nRBC: 0 % (ref 0.0–0.2)

## 2023-04-23 LAB — BASIC METABOLIC PANEL
Anion gap: 8 (ref 5–15)
BUN: 22 mg/dL (ref 8–23)
CO2: 22 mmol/L (ref 22–32)
Calcium: 7.9 mg/dL — ABNORMAL LOW (ref 8.9–10.3)
Chloride: 103 mmol/L (ref 98–111)
Creatinine, Ser: 0.9 mg/dL (ref 0.61–1.24)
GFR, Estimated: >60 mL/min (ref >60)
Glucose, Bld: 148 mg/dL — ABNORMAL HIGH (ref 70–99)
Potassium: 4.2 mmol/L (ref 3.5–5.1)
Sodium: 133 mmol/L — ABNORMAL LOW (ref 135–145)

## 2023-04-23 MED ORDER — DOXYCYCLINE HYCLATE 100 MG PO CAPS: 100.0000 mg | ORAL_CAPSULE | Freq: Two times a day (BID) | ORAL | 0 refills | Status: DC

## 2023-04-23 MED ORDER — TRAMADOL HCL 50 MG PO TABS
50.0000 mg | ORAL_TABLET | Freq: Once | ORAL | Status: AC
  Administered 2023-04-23: 50 mg via ORAL
  Filled 2023-04-23: qty 1

## 2023-04-23 MED ORDER — LIDOCAINE HCL 2 % IJ SOLN
10.0000 mL | Freq: Once | INTRAMUSCULAR | Status: AC
  Administered 2023-04-23: 200 mg
  Filled 2023-04-23: qty 20

## 2023-04-23 MED ORDER — HEPARIN SOD (PORK) LOCK FLUSH 100 UNIT/ML IV SOLN
500.0000 [IU] | Freq: Once | INTRAVENOUS | Status: AC
  Administered 2023-04-23: 500 [IU]
  Filled 2023-04-23: qty 5

## 2023-04-23 MED ORDER — COLCHICINE 0.6 MG PO TABS: ORAL_TABLET | ORAL | 0 refills | Status: DC

## 2023-04-23 NOTE — ED Triage Notes (Signed)
Patient reports right knee and foot swelling and pain since 04/16/23 Says he was sitting on his knees replacing smoke detector that day Swelling is increasingly worse Pain 10/10

## 2023-04-23 NOTE — ED Provider Notes (Signed)
[place] EMERGENCY DEPARTMENT AT [facility] Provider Note   CSN: [id number] Arrival date & time: 04/23/23  1128     History  Chief Complaint  Patient presents with   Leg Swelling    Fernando Lee is a 77 y.o. male with history of myelodysplastic syndrome, GERD, prediabetes, OSA, who presents the emergency department complaining of right knee and foot swelling.  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  Swelling and pain has been getting worse, and start developing redness to the right great toe.  History of gout.  Is currently on prednisone as part of his cancer care, and has had no improvement in the swelling.  No fevers or chills.  Some pain with weightbearing and bending the right knee. Does have history of gout in the left foot.   HPI     Home Medications Prior to Admission medications   Medication Sig Start Date End Date Taking? Authorizing Provider  colchicine 0.6 MG tablet Take 2 tablets (1.2 mg), followed by 1 tablet (0.6 mg) 1 hour later. Wait 3 days before starting next round. 04/23/23  Yes Shawntay Prest T, PA-C  doxycycline (VIBRAMYCIN) 100 MG capsule Take 1 capsule (100 mg total) by mouth 2 (two) times daily. 04/23/23  Yes Felix Pratt T, PA-C  ALFUZOSIN HCL ER PO Take 1 tablet by mouth daily after supper. 09/02/22   [provider]  aspirin EC 81 MG tablet Take 81 mg by mouth daily. Swallow whole.    [provider]  B Complex Vitamins (B COMPLEX PO) Take 1 tablet by mouth daily.    [provider]  Cholecalciferol (VITAMIN D3) 50 MCG (2000 UT) TABS Take 2,000 Units by mouth daily.    [provider]  docusate sodium (COLACE) 100 MG capsule Take 100 mg by mouth 2 (two) times daily.    [provider]  FIBER PO Take 1 capsule by mouth daily. Unknown strength    [provider]  fish oil-omega-3 fatty acids  1000 MG capsule Take 1 g by mouth 2 (two) times daily. Strength 300mg /1500mg     [provider]  Flaxseed, Linseed, (FLAXSEED OIL) 1000 MG CAPS Take 1,000 mg by mouth daily.    [provider]  GLUCOSAMINE CHONDROITIN MSM PO Take 1 tablet by mouth daily. Strength 1500/1500    [provider]  lidocaine-prilocaine (EMLA) cream Apply to affected area once Patient taking differently: Apply 1 Application topically once. Apply to affected area once 09/02/22   Johney Maine, MD  Misc Natural Products (PROSTATE SUPPORT PO) Take 1 capsule by mouth daily. Unknown strength    [provider]  Multiple Vitamin (MULTIVITAMIN) tablet Take 1 tablet by mouth daily. Unknown strength    [provider]  ondansetron (ZOFRAN) 8 MG tablet Take 1 tablet (8 mg total) by mouth every 8 (eight) hours as needed for nausea or vomiting. 09/02/22   Johney Maine, MD  predniSONE (DELTASONE) 20 MG tablet Take 3 tablets (60 mg total) by mouth daily with breakfast for 5 days, THEN 2 tablets (40 mg total) daily with breakfast for 5 days, THEN 1 tablet (20 mg total) daily with breakfast for 15 days, THEN 0.5 tablets (10 mg total) daily with breakfast for 15 days, THEN 0.5 tablets (10 mg total) every other day for 20 days. 04/06/23 06/05/23  Johney Maine, MD  prochlorperazine (COMPAZINE) 10 MG  tablet Take 1 tablet (10 mg total) by mouth every 6 (six) hours as needed for nausea or vomiting. 09/02/22   Johney Maine, MD  psyllium (METAMUCIL) 58.6 % powder Take 1 packet by mouth daily.    [provider]  REVLIMID 10 MG capsule Take 1 capsule (10 mg total) by mouth daily. Take for 21 days on, then 7 days off. Repeat every 28 days. 04/05/23   Johney Maine, MD  senna-docusate (SENNA S) 8.6-50 MG tablet Take 2 tablets by mouth at bedtime. 09/24/22   Johney Maine, MD  traMADol (ULTRAM) 50 MG tablet Take 50 mg by mouth as needed for moderate pain or  severe pain.    [provider]  Turmeric (QC TUMERIC COMPLEX PO) Take 750 mg by mouth daily.    [provider]  vitamin C (ASCORBIC ACID) 250 MG tablet Take 500 mg by mouth daily.    [provider]      Allergies    Patient has no known allergies.    Review of Systems   Review of Systems  Musculoskeletal:  Positive for arthralgias.  Skin:  Positive for color change.  All other systems reviewed and are negative.   Physical Exam Updated Vital Signs BP (!) 104/59 (BP Location: Right Arm)   Pulse 63   Temp 97.8 F (36.6 C) (Oral)   Resp 17   Ht 5\' 7"  (1.702 m)   Wt 72.6 kg   SpO2 99%   BMI 25.06 kg/m  Physical Exam Vitals and nursing note reviewed.  Constitutional:      Appearance: Normal appearance.  HENT:     Head: Normocephalic and atraumatic.  Eyes:     Conjunctiva/sclera: Conjunctivae normal.  Pulmonary:     Effort: Pulmonary effort is normal. No respiratory distress.  Musculoskeletal:     Comments: Generalized swelling of the R knee with point tenderness over the anterior knee, normal ROM.  Generalized 2+ pitting edema noted from the foot to the level of the knee.  1+ pitting edema in the left leg.  Feet:     Right foot:     Skin integrity: Erythema and warmth present.     Comments: Erythema and edema noted to the right first MTP, with mild fluctuance Skin:    General: Skin is warm and dry.  Neurological:     Mental Status: He is alert.  Psychiatric:        Mood and Affect: Mood normal.        Behavior: Behavior normal.     ED Results / Procedures / Treatments   Labs (all labs ordered are listed, but only abnormal results are displayed) Labs Reviewed  CBC - Abnormal; Notable for the following components:      Result Value   RBC 2.35 (*)    Hemoglobin 6.9 (*)    HCT 21.2 (*)    All other components within normal limits  BASIC METABOLIC PANEL - Abnormal; Notable for the following components:   Sodium 133 (*)    Glucose,  Bld 148 (*)    Calcium 7.9 (*)    All other components within normal limits    EKG None  Radiology DG Toe Great Right  Result Date: 04/23/2023 CLINICAL DATA:  Right great toe pain for are 1 week. EXAM: RIGHT GREAT TOE COMPARISON:  None Available. FINDINGS: No fracture.  No bone lesion. Mild asymmetric joint space narrowing of the first metatarsophalangeal joint small marginal osteophytes. IP joint normally  spaced and aligned. Soft tissue swelling/prominence medial to the first metatarsal head. No soft tissue calcification. IMPRESSION: 1. No fracture. 2. 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 3. Nonspecific soft tissue swelling medial to the first metatarsal head. No underlying erosion. Electronically Signed   By: Amie Portland M.D.   On: 04/23/2023 13:45   DG Knee Complete 4 Views Right  Result Date: 04/23/2023 CLINICAL DATA:  Right anterior knee pain and swelling. EXAM: RIGHT KNEE - COMPLETE 4+ VIEW COMPARISON:  None Available. FINDINGS: No fracture.  No bone lesion. Mild medial joint space compartment narrowing. Minor marginal osteophytes from the patella and medial compartment. No other degenerative change. No joint effusion. Soft tissue edema noted anterior to the patella. IMPRESSION: 1. No fracture or acute finding. 2. Minor disc degenerative changes.  No joint effusion. 3. Anterior soft tissue swelling, anterior to the patella. Electronically Signed   By: Amie Portland M.D.   On: 04/23/2023 13:42    Procedures .[name] KitchenIncision and Drainage  Date/Time: 04/23/2023 3:07 PM  Performed by: Su Monks, PA-C Authorized by: Su Monks, PA-C   Consent:    Consent obtained:  Verbal   Consent given by:  Patient   Risks, benefits, and alternatives were discussed: yes     Risks discussed:  Bleeding, incomplete drainage and pain   Alternatives discussed:  No treatment and alternative treatment Universal protocol:    Procedure explained  and questions answered to patient or proxy's satisfaction: yes     Relevant documents present and verified: yes     Test results available : yes     Imaging studies available: yes     Required blood products, implants, devices, and special equipment available: yes     Patient identity confirmed:  Provided demographic data Location:    Type:  Cyst   Size:  2.5 cm   Location:  Lower extremity   Lower extremity location:  Toe   Toe location:  R big toe Pre-procedure details:    Skin preparation:  Povidone-iodine Sedation:    Sedation type:  None Anesthesia:    Anesthesia method:  Local infiltration   Local anesthetic:  Lidocaine 2% w/o epi Procedure type:    Complexity:  Simple Procedure details:    Needle aspiration: yes     Needle size:  18 G   Incision types:  Stab incision   Incision depth:  Subcutaneous   Wound management:  Probed and deloculated   Drainage:  Bloody (cystic material)   Drainage amount:  Moderate   Wound treatment:  Wound left open   Packing materials:  None Post-procedure details:    Procedure completion:  Tolerated well, no immediate complications     Medications Ordered in ED Medications  heparin lock flush 100 unit/mL (has no administration in time range)  lidocaine (XYLOCAINE) 2 % (with pres) injection 200 mg (200 mg Infiltration Given 04/23/23 1351)  traMADol (ULTRAM) tablet 50 mg (50 mg Oral Given 04/23/23 1350)    ED Course/ Medical Decision Making/ A&P                             Medical Decision Making Amount and/or Complexity of Data Reviewed Labs: ordered. Radiology: ordered.  Risk Prescription drug management.  This patient is a 77 y.o. male  who presents to the ED for concern of right great toe redness, leg swelling, knee pain.   Differential diagnoses prior to evaluation: The  emergent differential diagnosis includes, but is not limited to,  gout, abscess, cellulitis, osteomyelitis. This is not an exhaustive differential.   Past  Medical History / Co-morbidities / Social History: myelodysplastic syndrome, GERD, prediabetes, OSA  Additional history: Chart reviewed. Pertinent results include:   Physical Exam: Physical exam performed. The pertinent findings include: Redness of right great toe MTP with fluctuance, 2+ pitting edema of the right leg, generalized swelling of R knee without erythema or effusion  Lab Tests/Imaging studies: I personally interpreted labs/imaging and the pertinent results include: No leukocytosis, hemoglobin stable at 6.9 compared to prior..  CMP unremarkable.    Knee x-ray with no acute findings, no joint effusion.  Right great toe x-ray with MTP arthropathic changes and nonspecific soft tissue swelling, without underlying erosion of the bone.  I agree with the radiologist interpretation.  Medications: I ordered medication including tramadol, lidocaine, heparin flush for port access.  I have reviewed the patients home medicines and have made adjustments as needed.  Procedure: Abscess was incised and drained. Area was anesthestized with lidocaine 2% without epi.  Moderate amount of cystic material excised.  Patient tolerated procedure well with no immediate complications.   Disposition: After consideration of the diagnostic results and the patients response to treatment, I feel that emergency department workup does not suggest an emergent condition requiring admission or immediate intervention beyond what has been performed at this time. The plan is: Discharged home with treatment of cellulitis s/p I&D and possible gout.  Will treat with antibiotics and colchicine.  Patient has a follow-up appointment with cancer center in 2 days for another blood transfusion.  Will provide follow-up information for orthopedics and podiatry. The patient is safe for discharge and has been instructed to return immediately for worsening symptoms, change in symptoms or any other concerns.  I discussed this case with  my attending physician Dr. Particia Nearing who cosigned this note including patient's presenting symptoms, physical exam, and planned diagnostics and interventions. Attending physician stated agreement with plan or made changes to plan which were implemented.   Final Clinical Impression(s) / ED Diagnoses Final diagnoses:  Toe swelling  Infected cyst of skin  Acute gout involving toe of right foot, unspecified cause  Leg swelling    Rx / DC Orders ED Discharge Orders          Ordered    colchicine 0.6 MG tablet        04/23/23 1451    doxycycline (VIBRAMYCIN) 100 MG capsule  2 times daily        04/23/23 1451           Portions of this report may have been transcribed using voice recognition software. Every effort was made to ensure accuracy; however, inadvertent computerized transcription errors may be present.    Jeanella Flattery 04/23/23 1510    Jacalyn Lefevre, MD 04/23/23 1537

## 2023-04-23 NOTE — Discharge Instructions (Signed)
You were seen in the ER for toe pain and leg swelling.  We were able to drain what appeared to be infected cystic material from your toe. I want to treat you with antibiotics for the skin infection as well as medicine for gout. I've sent both medicines to your pharmacy.  I've attached contact information for an orthopedist and a podiatrist if you would like to follow up.  Continue to monitor how you're doing and return to the ER for new or worsening symptoms.

## 2023-04-25 ENCOUNTER — Other Ambulatory Visit: Payer: Self-pay

## 2023-04-25 ENCOUNTER — Inpatient Hospital Stay: Payer: Medicare Other

## 2023-04-25 DIAGNOSIS — K219 Gastro-esophageal reflux disease without esophagitis: Secondary | ICD-10-CM | POA: Diagnosis not present

## 2023-04-25 DIAGNOSIS — Z86010 Personal history of colonic polyps: Secondary | ICD-10-CM | POA: Diagnosis not present

## 2023-04-25 DIAGNOSIS — E785 Hyperlipidemia, unspecified: Secondary | ICD-10-CM | POA: Diagnosis not present

## 2023-04-25 DIAGNOSIS — D696 Thrombocytopenia, unspecified: Secondary | ICD-10-CM | POA: Diagnosis not present

## 2023-04-25 DIAGNOSIS — R109 Unspecified abdominal pain: Secondary | ICD-10-CM | POA: Diagnosis not present

## 2023-04-25 DIAGNOSIS — J90 Pleural effusion, not elsewhere classified: Secondary | ICD-10-CM | POA: Diagnosis not present

## 2023-04-25 DIAGNOSIS — Z7961 Long term (current) use of immunomodulator: Secondary | ICD-10-CM | POA: Diagnosis not present

## 2023-04-25 DIAGNOSIS — Z79899 Other long term (current) drug therapy: Secondary | ICD-10-CM | POA: Diagnosis not present

## 2023-04-25 DIAGNOSIS — D469 Myelodysplastic syndrome, unspecified: Secondary | ICD-10-CM

## 2023-04-25 DIAGNOSIS — M109 Gout, unspecified: Secondary | ICD-10-CM | POA: Diagnosis not present

## 2023-04-25 DIAGNOSIS — R161 Splenomegaly, not elsewhere classified: Secondary | ICD-10-CM | POA: Diagnosis not present

## 2023-04-25 DIAGNOSIS — N202 Calculus of kidney with calculus of ureter: Secondary | ICD-10-CM | POA: Diagnosis not present

## 2023-04-25 DIAGNOSIS — R5383 Other fatigue: Secondary | ICD-10-CM | POA: Diagnosis not present

## 2023-04-25 DIAGNOSIS — D464 Refractory anemia, unspecified: Secondary | ICD-10-CM | POA: Diagnosis not present

## 2023-04-25 DIAGNOSIS — K449 Diaphragmatic hernia without obstruction or gangrene: Secondary | ICD-10-CM | POA: Diagnosis not present

## 2023-04-25 DIAGNOSIS — Z87891 Personal history of nicotine dependence: Secondary | ICD-10-CM | POA: Diagnosis not present

## 2023-04-25 DIAGNOSIS — R600 Localized edema: Secondary | ICD-10-CM | POA: Diagnosis not present

## 2023-04-25 DIAGNOSIS — M79674 Pain in right toe(s): Secondary | ICD-10-CM | POA: Diagnosis not present

## 2023-04-25 DIAGNOSIS — Z7982 Long term (current) use of aspirin: Secondary | ICD-10-CM | POA: Diagnosis not present

## 2023-04-25 DIAGNOSIS — D72829 Elevated white blood cell count, unspecified: Secondary | ICD-10-CM | POA: Diagnosis not present

## 2023-04-25 LAB — TYPE AND SCREEN
ABO/RH(D): A POS
Unit division: 0

## 2023-04-25 LAB — CMP (CANCER CENTER ONLY)
ALT: 32 U/L (ref 0–44)
AST: 12 U/L — ABNORMAL LOW (ref 15–41)
Albumin: 3.8 g/dL (ref 3.5–5.0)
Alkaline Phosphatase: 40 U/L (ref 38–126)
Anion gap: 9 (ref 5–15)
BUN: 23 mg/dL (ref 8–23)
CO2: 26 mmol/L (ref 22–32)
Calcium: 8.6 mg/dL — ABNORMAL LOW (ref 8.9–10.3)
Chloride: 99 mmol/L (ref 98–111)
Creatinine: 1.04 mg/dL (ref 0.61–1.24)
GFR, Estimated: >60 mL/min (ref >60)
Glucose, Bld: 217 mg/dL — ABNORMAL HIGH (ref 70–99)
Potassium: 4.1 mmol/L (ref 3.5–5.1)
Sodium: 134 mmol/L — ABNORMAL LOW (ref 135–145)
Total Bilirubin: 1.1 mg/dL (ref 0.3–1.2)
Total Protein: 5.9 g/dL — ABNORMAL LOW (ref 6.5–8.1)

## 2023-04-25 LAB — CBC WITH DIFFERENTIAL (CANCER CENTER ONLY)
Abs Immature Granulocytes: 0.53 10*3/uL — ABNORMAL HIGH (ref 0.00–0.07)
Basophils Absolute: 0.1 10*3/uL (ref 0.0–0.1)
Basophils Relative: 2 %
Eosinophils Absolute: 1.4 10*3/uL — ABNORMAL HIGH (ref 0.0–0.5)
Eosinophils Relative: 17 %
HCT: 21.4 % — ABNORMAL LOW (ref 39.0–52.0)
Hemoglobin: 7.1 g/dL — ABNORMAL LOW (ref 13.0–17.0)
Immature Granulocytes: 7 %
Lymphocytes Relative: 6 %
Lymphs Abs: 0.5 10*3/uL — ABNORMAL LOW (ref 0.7–4.0)
MCH: 29.2 pg (ref 26.0–34.0)
MCHC: 33.2 g/dL (ref 30.0–36.0)
MCV: 88.1 fL (ref 80.0–100.0)
Monocytes Absolute: 0.7 10*3/uL (ref 0.1–1.0)
Monocytes Relative: 8 %
Neutro Abs: 4.9 10*3/uL (ref 1.7–7.7)
Neutrophils Relative %: 60 %
Platelet Count: 301 10*3/uL (ref 150–400)
RBC: 2.43 MIL/uL — ABNORMAL LOW (ref 4.22–5.81)
RDW: 14.6 % (ref 11.5–15.5)
WBC Count: 8.1 10*3/uL (ref 4.0–10.5)
nRBC: 0 % (ref 0.0–0.2)

## 2023-04-25 LAB — SAMPLE TO BLOOD BANK

## 2023-04-25 LAB — PREPARE RBC (CROSSMATCH)

## 2023-04-25 LAB — BPAM RBC

## 2023-04-25 MED ORDER — SODIUM CHLORIDE 0.9% FLUSH
10.0000 mL | INTRAVENOUS | Status: AC | PRN
  Administered 2023-04-25: 10 mL

## 2023-04-25 MED ORDER — ACETAMINOPHEN 325 MG PO TABS
650.0000 mg | ORAL_TABLET | Freq: Once | ORAL | Status: AC
  Administered 2023-04-25: 650 mg via ORAL
  Filled 2023-04-25: qty 2

## 2023-04-25 MED ORDER — SODIUM CHLORIDE 0.9% IV SOLUTION
250.0000 mL | Freq: Once | INTRAVENOUS | Status: AC
  Administered 2023-04-25: 250 mL via INTRAVENOUS

## 2023-04-25 MED ORDER — SODIUM CHLORIDE 0.9% FLUSH
10.0000 mL | Freq: Once | INTRAVENOUS | Status: AC
  Administered 2023-04-25: 10 mL

## 2023-04-25 MED ORDER — SODIUM CHLORIDE 0.9% FLUSH: 3.0000 mL | INTRAVENOUS | Status: DC | PRN

## 2023-04-25 MED ORDER — METHYLPREDNISOLONE SODIUM SUCC 40 MG IJ SOLR
40.0000 mg | Freq: Once | INTRAMUSCULAR | Status: AC
  Administered 2023-04-25: 40 mg via INTRAVENOUS
  Filled 2023-04-25: qty 1

## 2023-04-25 MED ORDER — HEPARIN SOD (PORK) LOCK FLUSH 100 UNIT/ML IV SOLN
250.0000 [IU] | INTRAVENOUS | Status: AC | PRN
  Administered 2023-04-25: 250 [IU]

## 2023-04-25 NOTE — Patient Instructions (Signed)
Blood Transfusion, Adult, Care After The following information offers guidance on how to care for yourself after your procedure. Your health care provider may also give you more specific instructions. If you have problems or questions, contact your health care provider. What can I expect after the procedure? After the procedure, it is common to have: Bruising and soreness where the IV was inserted. A headache. Follow these instructions at home: IV insertion site care     Follow instructions from your health care provider about how to take care of your IV insertion site. Make sure you: Wash your hands with soap and water for at least 20 seconds before and after you change your bandage (dressing). If soap and water are not available, use hand sanitizer. Change your dressing as told by your health care provider. Check your IV insertion site every day for signs of infection. Check for: Redness, swelling, or pain. Bleeding from the site. Warmth. Pus or a bad smell. General instructions Take over-the-counter and prescription medicines only as told by your health care provider. Rest as told by your health care provider. Return to your normal activities as told by your health care provider. Keep all follow-up visits. Lab tests may need to be done at certain periods to recheck your blood counts. Contact a health care provider if: You have itching or red, swollen areas of skin (hives). You have a fever or chills. You have pain in the head, back, or chest. You feel anxious or you feel weak after doing your normal activities. You have redness, swelling, warmth, or pain around the IV insertion site. You have blood coming from the IV insertion site that does not stop with pressure. You have pus or a bad smell coming from your IV insertion site. If you received your blood transfusion in an outpatient setting, you will be told whom to contact to report any reactions. Get help right away if: You  have symptoms of a serious allergic or immune system reaction, including: Trouble breathing or shortness of breath. Swelling of the face, feeling flushed, or widespread rash. Dark urine or blood in the urine. Fast heartbeat. These symptoms may be an emergency. Get help right away. Call 911. Do not wait to see if the symptoms will go away. Do not drive yourself to the hospital. Summary Bruising and soreness around the IV insertion site are common. Check your IV insertion site every day for signs of infection. Rest as told by your health care provider. Return to your normal activities as told by your health care provider. Get help right away for symptoms of a serious allergic or immune system reaction to the blood transfusion. This information is not intended to replace advice given to you by your health care provider. Make sure you discuss any questions you have with your health care provider. Document Revised: 01/01/2022 Document Reviewed: 01/01/2022 Elsevier Patient Education  2024 Elsevier Inc.  

## 2023-04-26 LAB — TYPE AND SCREEN: Antibody Screen: NEGATIVE

## 2023-04-26 LAB — BPAM RBC
Blood Product Expiration Date: 202408042359
ISSUE DATE / TIME: 202407081049
Unit Type and Rh: 6200

## 2023-04-27 ENCOUNTER — Other Ambulatory Visit: Payer: Self-pay

## 2023-04-27 DIAGNOSIS — D469 Myelodysplastic syndrome, unspecified: Secondary | ICD-10-CM

## 2023-04-27 MED ORDER — REVLIMID 10 MG PO CAPS
10.0000 mg | ORAL_CAPSULE | Freq: Every day | ORAL | 0 refills | Status: DC
Start: 2023-04-27 — End: 2023-05-24

## 2023-04-28 ENCOUNTER — Inpatient Hospital Stay: Payer: Medicare Other

## 2023-04-29 ENCOUNTER — Other Ambulatory Visit: Payer: Self-pay

## 2023-04-29 DIAGNOSIS — D469 Myelodysplastic syndrome, unspecified: Secondary | ICD-10-CM

## 2023-05-03 ENCOUNTER — Inpatient Hospital Stay: Payer: Medicare Other

## 2023-05-03 ENCOUNTER — Other Ambulatory Visit: Payer: Self-pay

## 2023-05-03 DIAGNOSIS — E785 Hyperlipidemia, unspecified: Secondary | ICD-10-CM | POA: Diagnosis not present

## 2023-05-03 DIAGNOSIS — D696 Thrombocytopenia, unspecified: Secondary | ICD-10-CM | POA: Diagnosis not present

## 2023-05-03 DIAGNOSIS — K449 Diaphragmatic hernia without obstruction or gangrene: Secondary | ICD-10-CM | POA: Diagnosis not present

## 2023-05-03 DIAGNOSIS — N202 Calculus of kidney with calculus of ureter: Secondary | ICD-10-CM | POA: Diagnosis not present

## 2023-05-03 DIAGNOSIS — Z86010 Personal history of colonic polyps: Secondary | ICD-10-CM | POA: Diagnosis not present

## 2023-05-03 DIAGNOSIS — D72829 Elevated white blood cell count, unspecified: Secondary | ICD-10-CM | POA: Diagnosis not present

## 2023-05-03 DIAGNOSIS — M79674 Pain in right toe(s): Secondary | ICD-10-CM | POA: Diagnosis not present

## 2023-05-03 DIAGNOSIS — R600 Localized edema: Secondary | ICD-10-CM | POA: Diagnosis not present

## 2023-05-03 DIAGNOSIS — R161 Splenomegaly, not elsewhere classified: Secondary | ICD-10-CM | POA: Diagnosis not present

## 2023-05-03 DIAGNOSIS — D464 Refractory anemia, unspecified: Secondary | ICD-10-CM | POA: Diagnosis not present

## 2023-05-03 DIAGNOSIS — K219 Gastro-esophageal reflux disease without esophagitis: Secondary | ICD-10-CM | POA: Diagnosis not present

## 2023-05-03 DIAGNOSIS — R5383 Other fatigue: Secondary | ICD-10-CM | POA: Diagnosis not present

## 2023-05-03 DIAGNOSIS — M109 Gout, unspecified: Secondary | ICD-10-CM | POA: Diagnosis not present

## 2023-05-03 DIAGNOSIS — D469 Myelodysplastic syndrome, unspecified: Secondary | ICD-10-CM

## 2023-05-03 DIAGNOSIS — J90 Pleural effusion, not elsewhere classified: Secondary | ICD-10-CM | POA: Diagnosis not present

## 2023-05-03 DIAGNOSIS — Z79899 Other long term (current) drug therapy: Secondary | ICD-10-CM | POA: Diagnosis not present

## 2023-05-03 DIAGNOSIS — Z7982 Long term (current) use of aspirin: Secondary | ICD-10-CM | POA: Diagnosis not present

## 2023-05-03 DIAGNOSIS — Z7961 Long term (current) use of immunomodulator: Secondary | ICD-10-CM | POA: Diagnosis not present

## 2023-05-03 DIAGNOSIS — Z87891 Personal history of nicotine dependence: Secondary | ICD-10-CM | POA: Diagnosis not present

## 2023-05-03 DIAGNOSIS — R109 Unspecified abdominal pain: Secondary | ICD-10-CM | POA: Diagnosis not present

## 2023-05-03 LAB — CMP (CANCER CENTER ONLY)
ALT: 35 U/L (ref 0–44)
AST: 13 U/L — ABNORMAL LOW (ref 15–41)
Albumin: 3.8 g/dL (ref 3.5–5.0)
Alkaline Phosphatase: 36 U/L — ABNORMAL LOW (ref 38–126)
Anion gap: 6 (ref 5–15)
BUN: 21 mg/dL (ref 8–23)
CO2: 26 mmol/L (ref 22–32)
Calcium: 8.8 mg/dL — ABNORMAL LOW (ref 8.9–10.3)
Chloride: 103 mmol/L (ref 98–111)
Creatinine: 1.05 mg/dL (ref 0.61–1.24)
GFR, Estimated: >60 mL/min (ref >60)
Glucose, Bld: 167 mg/dL — ABNORMAL HIGH (ref 70–99)
Potassium: 4.1 mmol/L (ref 3.5–5.1)
Sodium: 135 mmol/L (ref 135–145)
Total Bilirubin: 0.8 mg/dL (ref 0.3–1.2)
Total Protein: 5.7 g/dL — ABNORMAL LOW (ref 6.5–8.1)

## 2023-05-03 LAB — TYPE AND SCREEN

## 2023-05-03 LAB — CBC WITH DIFFERENTIAL (CANCER CENTER ONLY)
Abs Immature Granulocytes: 0.46 10*3/uL — ABNORMAL HIGH (ref 0.00–0.07)
Basophils Absolute: 0.2 10*3/uL — ABNORMAL HIGH (ref 0.0–0.1)
Basophils Relative: 3 %
Eosinophils Absolute: 0.8 10*3/uL — ABNORMAL HIGH (ref 0.0–0.5)
Eosinophils Relative: 9 %
HCT: 18.2 % — ABNORMAL LOW (ref 39.0–52.0)
Hemoglobin: 6.1 g/dL — CL (ref 13.0–17.0)
Immature Granulocytes: 6 %
Lymphocytes Relative: 7 %
Lymphs Abs: 0.6 10*3/uL — ABNORMAL LOW (ref 0.7–4.0)
MCH: 29.5 pg (ref 26.0–34.0)
MCHC: 33.5 g/dL (ref 30.0–36.0)
MCV: 87.9 fL (ref 80.0–100.0)
Monocytes Absolute: 1 10*3/uL (ref 0.1–1.0)
Monocytes Relative: 11 %
Neutro Abs: 5.4 10*3/uL (ref 1.7–7.7)
Neutrophils Relative %: 64 %
Platelet Count: 340 10*3/uL (ref 150–400)
RBC: 2.07 MIL/uL — ABNORMAL LOW (ref 4.22–5.81)
RDW: 14.5 % (ref 11.5–15.5)
WBC Count: 8.4 10*3/uL (ref 4.0–10.5)
nRBC: 0 % (ref 0.0–0.2)

## 2023-05-03 LAB — PREPARE RBC (CROSSMATCH)

## 2023-05-03 LAB — BPAM RBC
Blood Product Expiration Date: 202408092359
ISSUE DATE / TIME: 202407161034

## 2023-05-03 LAB — SAMPLE TO BLOOD BANK

## 2023-05-03 MED ORDER — HEPARIN SOD (PORK) LOCK FLUSH 100 UNIT/ML IV SOLN
250.0000 [IU] | INTRAVENOUS | Status: AC | PRN
  Administered 2023-05-03: 250 [IU]

## 2023-05-03 MED ORDER — SODIUM CHLORIDE 0.9% FLUSH: 3.0000 mL | INTRAVENOUS | Status: DC | PRN

## 2023-05-03 MED ORDER — METHYLPREDNISOLONE SODIUM SUCC 40 MG IJ SOLR
40.0000 mg | Freq: Once | INTRAMUSCULAR | Status: AC
  Administered 2023-05-03: 40 mg via INTRAVENOUS
  Filled 2023-05-03: qty 1

## 2023-05-03 MED ORDER — ACETAMINOPHEN 325 MG PO TABS
650.0000 mg | ORAL_TABLET | Freq: Once | ORAL | Status: AC
  Administered 2023-05-03: 650 mg via ORAL
  Filled 2023-05-03: qty 2

## 2023-05-03 MED ORDER — SODIUM CHLORIDE 0.9% IV SOLUTION
250.0000 mL | Freq: Once | INTRAVENOUS | Status: AC
  Administered 2023-05-03: 250 mL via INTRAVENOUS

## 2023-05-03 MED ORDER — SODIUM CHLORIDE 0.9% FLUSH
10.0000 mL | INTRAVENOUS | Status: AC | PRN
  Administered 2023-05-03: 10 mL

## 2023-05-03 MED ORDER — SODIUM CHLORIDE 0.9% FLUSH
10.0000 mL | Freq: Once | INTRAVENOUS | Status: AC
  Administered 2023-05-03: 10 mL

## 2023-05-03 NOTE — Patient Instructions (Signed)

## 2023-05-04 ENCOUNTER — Other Ambulatory Visit: Payer: Self-pay

## 2023-05-04 DIAGNOSIS — D469 Myelodysplastic syndrome, unspecified: Secondary | ICD-10-CM

## 2023-05-04 LAB — TYPE AND SCREEN
ABO/RH(D): A POS
Antibody Screen: NEGATIVE
Unit division: 0
Unit division: 0

## 2023-05-04 LAB — BPAM RBC
Blood Product Expiration Date: 202408092359
ISSUE DATE / TIME: 202407161034
Unit Type and Rh: 6200
Unit Type and Rh: 6200

## 2023-05-05 ENCOUNTER — Inpatient Hospital Stay: Payer: Medicare Other

## 2023-05-05 ENCOUNTER — Inpatient Hospital Stay (HOSPITAL_BASED_OUTPATIENT_CLINIC_OR_DEPARTMENT_OTHER): Payer: Medicare Other | Admitting: Hematology

## 2023-05-05 ENCOUNTER — Other Ambulatory Visit: Payer: Self-pay

## 2023-05-05 VITALS — BP 102/62 | HR 70 | Temp 97.7°F | Resp 18 | Wt 159.4 lb

## 2023-05-05 DIAGNOSIS — D72829 Elevated white blood cell count, unspecified: Secondary | ICD-10-CM | POA: Diagnosis not present

## 2023-05-05 DIAGNOSIS — N202 Calculus of kidney with calculus of ureter: Secondary | ICD-10-CM | POA: Diagnosis not present

## 2023-05-05 DIAGNOSIS — R109 Unspecified abdominal pain: Secondary | ICD-10-CM | POA: Diagnosis not present

## 2023-05-05 DIAGNOSIS — R5383 Other fatigue: Secondary | ICD-10-CM | POA: Diagnosis not present

## 2023-05-05 DIAGNOSIS — R600 Localized edema: Secondary | ICD-10-CM | POA: Diagnosis not present

## 2023-05-05 DIAGNOSIS — D469 Myelodysplastic syndrome, unspecified: Secondary | ICD-10-CM | POA: Diagnosis not present

## 2023-05-05 DIAGNOSIS — Z7982 Long term (current) use of aspirin: Secondary | ICD-10-CM | POA: Diagnosis not present

## 2023-05-05 DIAGNOSIS — M79674 Pain in right toe(s): Secondary | ICD-10-CM | POA: Diagnosis not present

## 2023-05-05 DIAGNOSIS — R161 Splenomegaly, not elsewhere classified: Secondary | ICD-10-CM | POA: Diagnosis not present

## 2023-05-05 DIAGNOSIS — D464 Refractory anemia, unspecified: Secondary | ICD-10-CM | POA: Diagnosis not present

## 2023-05-05 DIAGNOSIS — D696 Thrombocytopenia, unspecified: Secondary | ICD-10-CM | POA: Diagnosis not present

## 2023-05-05 DIAGNOSIS — Z7961 Long term (current) use of immunomodulator: Secondary | ICD-10-CM | POA: Diagnosis not present

## 2023-05-05 DIAGNOSIS — J90 Pleural effusion, not elsewhere classified: Secondary | ICD-10-CM | POA: Diagnosis not present

## 2023-05-05 DIAGNOSIS — K449 Diaphragmatic hernia without obstruction or gangrene: Secondary | ICD-10-CM | POA: Diagnosis not present

## 2023-05-05 DIAGNOSIS — Z87891 Personal history of nicotine dependence: Secondary | ICD-10-CM | POA: Diagnosis not present

## 2023-05-05 DIAGNOSIS — M109 Gout, unspecified: Secondary | ICD-10-CM | POA: Diagnosis not present

## 2023-05-05 DIAGNOSIS — Z79899 Other long term (current) drug therapy: Secondary | ICD-10-CM | POA: Diagnosis not present

## 2023-05-05 DIAGNOSIS — E785 Hyperlipidemia, unspecified: Secondary | ICD-10-CM | POA: Diagnosis not present

## 2023-05-05 DIAGNOSIS — K219 Gastro-esophageal reflux disease without esophagitis: Secondary | ICD-10-CM | POA: Diagnosis not present

## 2023-05-05 DIAGNOSIS — Z86010 Personal history of colonic polyps: Secondary | ICD-10-CM | POA: Diagnosis not present

## 2023-05-05 LAB — CMP (CANCER CENTER ONLY)
ALT: 28 U/L (ref 0–44)
AST: 11 U/L — ABNORMAL LOW (ref 15–41)
Albumin: 3.8 g/dL (ref 3.5–5.0)
Alkaline Phosphatase: 35 U/L — ABNORMAL LOW (ref 38–126)
Anion gap: 5 (ref 5–15)
BUN: 22 mg/dL (ref 8–23)
CO2: 28 mmol/L (ref 22–32)
Calcium: 8.8 mg/dL — ABNORMAL LOW (ref 8.9–10.3)
Chloride: 103 mmol/L (ref 98–111)
Creatinine: 0.93 mg/dL (ref 0.61–1.24)
GFR, Estimated: >60 mL/min (ref >60)
Glucose, Bld: 127 mg/dL — ABNORMAL HIGH (ref 70–99)
Potassium: 4.4 mmol/L (ref 3.5–5.1)
Sodium: 136 mmol/L (ref 135–145)
Total Bilirubin: 0.9 mg/dL (ref 0.3–1.2)
Total Protein: 5.7 g/dL — ABNORMAL LOW (ref 6.5–8.1)

## 2023-05-05 LAB — CBC WITH DIFFERENTIAL (CANCER CENTER ONLY)
Abs Immature Granulocytes: 0.51 10*3/uL — ABNORMAL HIGH (ref 0.00–0.07)
Basophils Absolute: 0.2 10*3/uL — ABNORMAL HIGH (ref 0.0–0.1)
Basophils Relative: 3 %
Eosinophils Absolute: 0.4 10*3/uL (ref 0.0–0.5)
Eosinophils Relative: 5 %
HCT: 20.7 % — ABNORMAL LOW (ref 39.0–52.0)
Hemoglobin: 7 g/dL — ABNORMAL LOW (ref 13.0–17.0)
Immature Granulocytes: 7 %
Lymphocytes Relative: 7 %
Lymphs Abs: 0.5 10*3/uL — ABNORMAL LOW (ref 0.7–4.0)
MCH: 29.5 pg (ref 26.0–34.0)
MCHC: 33.8 g/dL (ref 30.0–36.0)
MCV: 87.3 fL (ref 80.0–100.0)
Monocytes Absolute: 1.1 10*3/uL — ABNORMAL HIGH (ref 0.1–1.0)
Monocytes Relative: 15 %
Neutro Abs: 4.8 10*3/uL (ref 1.7–7.7)
Neutrophils Relative %: 63 %
Platelet Count: 309 10*3/uL (ref 150–400)
RBC: 2.37 MIL/uL — ABNORMAL LOW (ref 4.22–5.81)
RDW: 14.5 % (ref 11.5–15.5)
Smear Review: NORMAL
WBC Count: 7.6 10*3/uL (ref 4.0–10.5)
nRBC: 0 % (ref 0.0–0.2)

## 2023-05-05 LAB — SAMPLE TO BLOOD BANK

## 2023-05-05 LAB — PREPARE RBC (CROSSMATCH)

## 2023-05-05 MED ORDER — SODIUM CHLORIDE 0.9% FLUSH
10.0000 mL | Freq: Once | INTRAVENOUS | Status: AC
  Administered 2023-05-05: 10 mL

## 2023-05-05 MED ORDER — SODIUM CHLORIDE 0.9% FLUSH: 3.0000 mL | INTRAVENOUS | Status: DC | PRN

## 2023-05-05 MED ORDER — ACETAMINOPHEN 325 MG PO TABS
650.0000 mg | ORAL_TABLET | Freq: Once | ORAL | Status: AC
  Administered 2023-05-05: 650 mg via ORAL
  Filled 2023-05-05: qty 2

## 2023-05-05 MED ORDER — METHYLPREDNISOLONE SODIUM SUCC 40 MG IJ SOLR
40.0000 mg | Freq: Once | INTRAMUSCULAR | Status: AC
  Administered 2023-05-05: 40 mg via INTRAVENOUS
  Filled 2023-05-05: qty 1

## 2023-05-05 MED ORDER — HEPARIN SOD (PORK) LOCK FLUSH 100 UNIT/ML IV SOLN
250.0000 [IU] | INTRAVENOUS | Status: AC | PRN
  Administered 2023-05-05: 250 [IU]

## 2023-05-05 MED ORDER — SODIUM CHLORIDE 0.9% IV SOLUTION
250.0000 mL | Freq: Once | INTRAVENOUS | Status: AC
  Administered 2023-05-05: 250 mL via INTRAVENOUS

## 2023-05-05 MED ORDER — SODIUM CHLORIDE 0.9% FLUSH
10.0000 mL | INTRAVENOUS | Status: AC | PRN
  Administered 2023-05-05: 10 mL

## 2023-05-05 NOTE — Progress Notes (Signed)
HEMATOLOGY/ONCOLOGY PROGRESS NOTE:   Date of Service: 05/05/23  Patient Care Team: Jackelyn Poling, DO as PCP - General (Family Medicine)  CHIEF COMPLAINTS:  Follow-up for continued evaluation and management of JAK2 positive myeloproliferative neoplasm/MDS  INTERVAL HISTORY:  Fernando Lee is a 77 y.o. male here for continued evaluation and management of his MPN/MDS with refractory anemia and thrombocytosis. Patient was last seen by me on 04/06/2023 and reported fatigue and a 4-pound weight-loss with normal p.o. intake.  Today, he is accompanied by his wife. He complains of increased fatigue. Patient denies any diarrhea, skin rashes, pain over the spleen, or abdominal pain.  He has been taking a half-tablet of Prednisone daily. Patient notes that at the end of the month, he will taper the dose to every other day.   Patient presented to the ED on 04/23/2023 for right knee and foot pain/swelling related to infected cyst. He reports that the swelling limited his ability to apply weight to the inside on his right foot previously. The cyst was drained and the swelling and pain decreased in his knee. Patient was put on Doxycyline oral antibiotic as well as Colchicine. He has taken Colchicine for a couple of days. He did endorse gout previously in his left foot a couple of months ago.  He reports that he experiences mild milky-white drainage but denies any bloody discharge. Patient has no open wound at this time. Patient does endorse mild LE edema which is not significantly bothersome at this time. He has been applying alcohol and antibiotic cream to the wound regularly and changes the bandage daily. Patient denies any other infection issues. Patient has no other issues with tolerating Revlimid.   Patient reports that he received two 6.1 units of blood on Tuesday.  MEDICAL HISTORY:  Past Medical History:  Diagnosis Date   Arthritis of right knee    Cervicalgia    Chest discomfort    normal  stress echo   Chest pain    Colon polyps    Dyslipidemia    Fluttering sensation of heart    GERD without esophagitis    Hematochezia    Hyperglycemia    Hyperlipidemia    Internal hemorrhoids    Kidney stones    Male erectile dysfunction, unspecified    Mixed dyslipidemia    Mixed hyperlipidemia    Ocular migraine    PAC (premature atrial contraction)    Personal history of colonic polyps    Prediabetes    Residual hemorrhoidal skin tags    Spleen enlarged    nov 2023 hospitalized   Unspecified hemorrhoids     SURGICAL HISTORY: Past Surgical History:  Procedure Laterality Date   BONE MARROW BIOPSY     IR IMAGING GUIDED PORT INSERTION  09/14/2022   KIDNEY STONE SURGERY     retrieval    SOCIAL HISTORY: Social History   Socioeconomic History   Marital status: Married    Spouse name: Not on file   Number of children: Not on file   Years of education: Not on file   Highest education level: Not on file  Occupational History   Not on file  Tobacco Use   Smoking status: Former    Types: Cigarettes   Smokeless tobacco: Never  Substance and Sexual Activity   Alcohol use: Yes    Alcohol/week: 1.0 standard drink of alcohol    Types: 1 Cans of beer per week   Drug use: Not on file   Sexual activity:  Not on file  Other Topics Concern   Not on file  Social History Narrative   Not on file   Social Determinants of Health   Financial Resource Strain: Not on file  Food Insecurity: Unknown (08/31/2022)   Hunger Vital Sign    Worried About Running Out of Food in the Last Year: Never true    Ran Out of Food in the Last Year: Not on file  Transportation Needs: No Transportation Needs (08/31/2022)   PRAPARE - Administrator, Civil Service (Medical): No    Lack of Transportation (Non-Medical): No  Physical Activity: Not on file  Stress: Not on file  Social Connections: Not on file  Intimate Partner Violence: Not on file    FAMILY HISTORY: Family History   Problem Relation Age of Onset   Stroke Brother    Congestive Heart Failure Brother     ALLERGIES:  has No Known Allergies.  MEDICATIONS:  Current Outpatient Medications  Medication Sig Dispense Refill   ALFUZOSIN HCL ER PO Take 1 tablet by mouth daily after supper.     aspirin EC 81 MG tablet Take 81 mg by mouth daily. Swallow whole.     B Complex Vitamins (B COMPLEX PO) Take 1 tablet by mouth daily.     Cholecalciferol (VITAMIN D3) 50 MCG (2000 UT) TABS Take 2,000 Units by mouth daily.     colchicine 0.6 MG tablet Take 2 tablets (1.2 mg), followed by 1 tablet (0.6 mg) 1 hour later. Wait 3 days before starting next round. 6 tablet 0   docusate sodium (COLACE) 100 MG capsule Take 100 mg by mouth 2 (two) times daily.     doxycycline (VIBRAMYCIN) 100 MG capsule Take 1 capsule (100 mg total) by mouth 2 (two) times daily. 20 capsule 0   FIBER PO Take 1 capsule by mouth daily. Unknown strength     fish oil-omega-3 fatty acids 1000 MG capsule Take 1 g by mouth 2 (two) times daily. Strength 300mg /1500mg      Flaxseed, Linseed, (FLAXSEED OIL) 1000 MG CAPS Take 1,000 mg by mouth daily.     GLUCOSAMINE CHONDROITIN MSM PO Take 1 tablet by mouth daily. Strength 1500/1500     lidocaine-prilocaine (EMLA) cream Apply to affected area once (Patient taking differently: Apply 1 Application topically once. Apply to affected area once) 30 g 3   Misc Natural Products (PROSTATE SUPPORT PO) Take 1 capsule by mouth daily. Unknown strength     Multiple Vitamin (MULTIVITAMIN) tablet Take 1 tablet by mouth daily. Unknown strength     ondansetron (ZOFRAN) 8 MG tablet Take 1 tablet (8 mg total) by mouth every 8 (eight) hours as needed for nausea or vomiting. 30 tablet 1   predniSONE (DELTASONE) 20 MG tablet Take 3 tablets (60 mg total) by mouth daily with breakfast for 5 days, THEN 2 tablets (40 mg total) daily with breakfast for 5 days, THEN 1 tablet (20 mg total) daily with breakfast for 15 days, THEN 0.5 tablets (10  mg total) daily with breakfast for 15 days, THEN 0.5 tablets (10 mg total) every other day for 20 days. 52.5 tablet 0   prochlorperazine (COMPAZINE) 10 MG tablet Take 1 tablet (10 mg total) by mouth every 6 (six) hours as needed for nausea or vomiting. 30 tablet 1   psyllium (METAMUCIL) 58.6 % powder Take 1 packet by mouth daily.     REVLIMID 10 MG capsule Take 1 capsule (10 mg total) by mouth daily. Take for  21 days on, then 7 days off. Repeat every 28 days. 21 capsule 0   senna-docusate (SENNA S) 8.6-50 MG tablet Take 2 tablets by mouth at bedtime. 60 tablet 1   traMADol (ULTRAM) 50 MG tablet Take 50 mg by mouth as needed for moderate pain or severe pain.     Turmeric (QC TUMERIC COMPLEX PO) Take 750 mg by mouth daily.     vitamin C (ASCORBIC ACID) 250 MG tablet Take 500 mg by mouth daily.     No current facility-administered medications for this visit.    REVIEW OF SYSTEMS:    10 Point review of Systems was done is negative except as noted above.   PHYSICAL EXAMINATION: .BP 102/62   Pulse 70   Temp 97.7 F (36.5 C)   Resp 18   Wt 159 lb 6.4 oz (72.3 kg)   SpO2 100%   BMI 24.97 kg/m    GENERAL:alert, in no acute distress and comfortable SKIN: no acute rashes, no significant lesions EYES: conjunctiva are pink and non-injected, sclera anicteric OROPHARYNX: MMM, no exudates, no oropharyngeal erythema or ulceration NECK: supple, no JVD LYMPH:  no palpable lymphadenopathy in the cervical, axillary or inguinal regions LUNGS: clear to auscultation b/l with normal respiratory effort HEART: regular rate & rhythm ABDOMEN:  normoactive bowel sounds , non tender, not distended. Extremity: no pedal edema PSYCH: alert & oriented x 3 with fluent speech NEURO: no focal motor/sensory deficits   LABORATORY DATA:  I have reviewed the data as listed .    Latest Ref Rng & Units 05/05/2023    9:29 AM 05/03/2023    8:50 AM 04/25/2023    7:56 AM  CBC  WBC 4.0 - 10.5 K/uL 7.6  8.4  8.1    Hemoglobin 13.0 - 17.0 g/dL 7.0  6.1  7.1   Hematocrit 39.0 - 52.0 % 20.7  18.2  21.4   Platelets 150 - 400 K/uL 309  340  301      Iron/TIBC/Ferritin/ %Sat    Component Value Date/Time   IRON 180 09/30/2022 0924   TIBC 193 (L) 09/30/2022 0924   FERRITIN 1,450 (H) 09/30/2022 0924   IRONPCTSAT 93 (H) 09/30/2022 0924      Latest Ref Rng & Units 05/05/2023    9:29 AM 05/03/2023    8:50 AM 04/25/2023    7:56 AM  CMP  Glucose 70 - 99 mg/dL 578  469  629   BUN 8 - 23 mg/dL 22  21  23    Creatinine 0.61 - 1.24 mg/dL 5.28  4.13  2.44   Sodium 135 - 145 mmol/L 136  135  134   Potassium 3.5 - 5.1 mmol/L 4.4  4.1  4.1   Chloride 98 - 111 mmol/L 103  103  99   CO2 22 - 32 mmol/L 28  26  26    Calcium 8.9 - 10.3 mg/dL 8.8  8.8  8.6   Total Protein 6.5 - 8.1 g/dL 5.7  5.7  5.9   Total Bilirubin 0.3 - 1.2 mg/dL 0.9  0.8  1.1   Alkaline Phos 38 - 126 U/L 35  36  40   AST 15 - 41 U/L 11  13  12    ALT 0 - 44 U/L 28  35  32     Lab Results  Component Value Date   LDH 110 01/04/2022    02/23/2021 BCR ABL    02/23/2021 JAK2   Surgical Pathology  CASE: [id number]  PATIENT:  Derius Behne  Bone Marrow Report  Clinical History: MPN with progressive anemia  (BH)  DIAGNOSIS:   BONE MARROW, ASPIRATE, CLOT, CORE:  -Hypercellular bone marrow with features of myeloid neoplasm  -See comment   PERIPHERAL BLOOD:  -Macrocytic anemia  -Leukocytosis  -Thrombocytosis   COMMENT:   The bone marrow/peripheral blood show persistent involvement by  previously known myeloid neoplasm.  There is a myeloproliferative  component as supported by previous JAK2 positivity.  However, there are  also dyspoietic changes primarily involving the megakaryocytic cell line  with numerous hypolobated/unilobated forms in addition to  dysgranulopoiesis to a lesser extent.  This is associated with  eosinophilia.  It is not entirely clear whether the overall findings  represent a myeloproliferative neoplasm with  treatment related changes  or represent a primary myeloproliferative/myelodysplastic neoplasm  including but not limited to myeloid neoplasms with eosinophilia.  Correlation with cytogenetic and FISH studies strongly recommended.      RADIOGRAPHIC STUDIES: I have personally reviewed the radiological images as listed and agreed with the findings in the report. DG Toe Great Right  Result Date: 04/23/2023 CLINICAL DATA:  Right great toe pain for are 1 week. EXAM: RIGHT GREAT TOE COMPARISON:  None Available. FINDINGS: No fracture.  No bone lesion. Mild asymmetric joint space narrowing of the first metatarsophalangeal joint small marginal osteophytes. IP joint normally spaced and aligned. Soft tissue swelling/prominence medial to the first metatarsal head. No soft tissue calcification. IMPRESSION: 1. No fracture. 2. 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 3. Nonspecific soft tissue swelling medial to the first metatarsal head. No underlying erosion. Electronically Signed   By: Amie Portland M.D.   On: 04/23/2023 13:45   DG Knee Complete 4 Views Right  Result Date: 04/23/2023 CLINICAL DATA:  Right anterior knee pain and swelling. EXAM: RIGHT KNEE - COMPLETE 4+ VIEW COMPARISON:  None Available. FINDINGS: No fracture.  No bone lesion. Mild medial joint space compartment narrowing. Minor marginal osteophytes from the patella and medial compartment. No other degenerative change. No joint effusion. Soft tissue edema noted anterior to the patella. IMPRESSION: 1. No fracture or acute finding. 2. Minor disc degenerative changes.  No joint effusion. 3. Anterior soft tissue swelling, anterior to the patella. Electronically Signed   By: Amie Portland M.D.   On: 04/23/2023 13:42     IR IMAGING GUIDED PORT INSERTION  Result Date: 09/14/2022 INDICATION: Poor IV access.  Myelodysplastic syndrome EXAM: IMPLANTED PORT A CATH PLACEMENT WITH ULTRASOUND AND FLUOROSCOPIC  GUIDANCE MEDICATIONS: None ANESTHESIA/SEDATION: Moderate (conscious) sedation was employed during this procedure. A total of Versed 3 mg and Fentanyl 100 mcg was administered intravenously. Moderate Sedation Time: 20 minutes. The patient's level of consciousness and vital signs were monitored continuously by radiology nursing throughout the procedure under my direct supervision. FLUOROSCOPY TIME:  Fluoroscopic dose; 1 mGy COMPLICATIONS: None immediate. PROCEDURE: The procedure, risks, benefits, and alternatives were explained to the patient. Questions regarding the procedure were encouraged and answered. The patient understands and consents to the procedure. The RIGHT neck and chest were prepped with chlorhexidine in a sterile fashion, and a sterile drape was applied covering the operative field. Maximum barrier sterile technique with sterile gowns and gloves were used for the procedure. A timeout was performed prior to the initiation of the procedure. Local anesthesia was provided with 1% lidocaine with epinephrine. After creating a small venotomy incision, a micropuncture kit was utilized to access the internal jugular vein under direct, real-time ultrasound guidance. Ultrasound image  documentation was performed. The microwire was kinked to measure appropriate catheter length. A subcutaneous port pocket was then created along the upper chest wall utilizing a combination of sharp and blunt dissection. The pocket was irrigated with sterile saline. A single lumen ISP power injectable port was chosen for placement. The 8 Fr catheter was tunneled from the port pocket site to the venotomy incision. The port was placed in the pocket. The external catheter was trimmed to appropriate length. At the venotomy, an 8 Fr peel-away sheath was placed over a guidewire under fluoroscopic guidance. The catheter was then placed through the sheath and the sheath was removed. Final catheter positioning was confirmed and documented with  a fluoroscopic spot radiograph. The port was accessed with a Huber needle, aspirated and flushed with heparinized saline. The port pocket incision was closed with interrupted 3-0 Vicryl suture then Dermabond was applied, including at the venotomy incision. Dressings were placed. The patient tolerated the procedure well without immediate post procedural complication. IMPRESSION: Successful placement of a RIGHT internal jugular approach power injectable Port-A-Cath. The tip of the catheter is positioned within the proximal RIGHT atrium. The catheter is ready for immediate use. Roanna Banning, MD [facility] Radiology Electronically Signed   By: Roanna Banning M.D.   On: 09/14/2022 17:18   CT ANGIO GI BLEED  Result Date: 08/31/2022 CLINICAL DATA:  Acute mesenteric ischemia. Abdominal pain. History of myelodysplastic syndrome. EXAM: CTA ABDOMEN AND PELVIS WITHOUT AND WITH CONTRAST TECHNIQUE: Multidetector CT imaging of the abdomen and pelvis was performed using the standard protocol during bolus administration of intravenous contrast. Multiplanar reconstructed images and MIPs were obtained and reviewed to evaluate the vascular anatomy. RADIATION DOSE REDUCTION: This exam was performed according to the departmental dose-optimization program which includes automated exposure control, adjustment of the mA and/or kV according to patient size and/or use of iterative reconstruction technique. CONTRAST:  OMNIPAQUE IOHEXOL 350 MG/ML SOLN COMPARISON:  Abdominal ultrasound examination 04/03/2021 FINDINGS: VASCULAR Aorta: Normal caliber abdominal aorta. Scattered atherosclerotic calcifications. No dissection. Celiac: Normal SMA: Normal Renals: Normal IMA: Patent Inflow: Normal Proximal Outflow: Normal Veins: Normal Review of the MIP images confirms the above findings. NON-VASCULAR Lower chest: Small left pleural effusion and bibasilar atelectasis. The heart is normal in size.  No pericardial effusion. There is a moderate to large hiatal hernia. Hepatobiliary: No hepatic lesions or intrahepatic biliary dilatation. The gallbladder is unremarkable. No common bile duct dilatation. Pancreas: No mass, inflammation or ductal dilatation. Spleen: Massive splenomegaly. The spleen measures 20 x 16 x 11 cm. No splenic lesions or splenic infarct. Adrenals/Urinary Tract: The left kidney is displaced medially and inferiorly by the enlarged spleen. No worrisome renal lesions,, hydronephrosis or pyelonephritis. There is a lower pole right renal calculus and there are 2 distal left ureteral calculi more proximal calculus measures 4 mm and the more distal calculus measures 5 mm. I do not see any hydroureter or hydronephrosis. Stomach/Bowel: The stomach, duodenum, small bowel and colon are grossly normal. No inflammatory changes, mass lesions or obstructive findings. Lymphatic: No abdominal or pelvic lymphadenopathy. Reproductive: The prostate gland is enlarged. The seminal vesicles are unremarkable. Other: No pelvic mass or adenopathy. No free pelvic fluid collections. No inguinal mass or adenopathy. No abdominal wall hernia or subcutaneous lesions. Musculoskeletal: No significant bony findings. IMPRESSION: 1. Normal caliber abdominal aorta and no dissection. The branch vessels are normal. 2. Splenomegaly. 3. Two distal left ureteral calculi but no hydroureter or hydronephrosis. 4. Lower pole right renal  calculus. 5. Moderate to large hiatal hernia. 6. Small left pleural effusion and bibasilar atelectasis. Electronically Signed   By: Rudie Meyer M.D.   On: 08/31/2022 18:13     ASSESSMENT & PLAN:    #1 JAK2-positive myeloproliferative neoplasm-primarily presenting with thrombocytosis and associated MDS causing refractory anemia -No polycythemia.  -Mild leukocytosis.  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 Has grade 1 out of 3 reticulin fibrosis.   Uncertain if this is primary or secondary. -LDH has remained within normal limits. 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 -CT Angio GI bleed scan from 09/01/2022 showed kidney stones.and significant splenomegaly  PLAN:  -Discussed lab results on 05/05/2023 in detail with patient. CBC showed WBC of 7.6K, hemoglobin of 7.0, and platelets of 309K -CMP normal, kidney function normal, potassium level normal -Revlimid is not over-suppressing WBC and platelets at this time -continue 10 MG Revlimid 3 weeks on 1 week off for MDS to reduce risk for transfusion supports. -patient's right foot infection may have been related to gout or underlying bunion that may have become infected. Revlimid may increase risk of infections to some degree.  -recommend silicone dressings to prevent any friction to the wound in right foot with shoe wear -advised patient to take precautions with Colchicine intake as it may suppress blood counts -continue to use compression socks to improve LE edema -continue Prednisone to help spleen and blood counts. Prednisone may also potentially block nausea symptoms. -Continue blood transfusion support as needed  -continue Aspirin to reduce risk of blood clots  -answered all of patient's and his wife's questions in detail regarding treatment    Follow-up: -continue weekly labs and 1 unit of PRBC as needed for hgb 7.5 x 12 -MD visit in 4 weeks  The total time spent in the appointment was 21 minutes* .  All of the patient's questions were answered with apparent satisfaction. The patient knows to call the clinic with any problems, questions or concerns.   Wyvonnia Lora MD MS AAHIVMS [facility] Hematology/Oncology Physician [facility]  .*Total Encounter Time as defined by the Centers for Medicare and Medicaid Services includes, in addition to the face-to-face time of a patient visit (documented in the note above) non-face-to-face time: obtaining and reviewing outside  history, ordering and reviewing medications, tests or procedures, care coordination (communications with other health care professionals or caregivers) and documentation in the medical record.    I,Mitra Faeizi,acting as a Neurosurgeon for Wyvonnia Lora, MD.,have documented all relevant documentation on the behalf of Wyvonnia Lora, MD,as directed by  Wyvonnia Lora, MD while in the presence of Wyvonnia Lora, MD.  .I have reviewed the above documentation for accuracy and completeness, and I agree with the above. Johney Maine MD

## 2023-05-05 NOTE — Patient Instructions (Signed)

## 2023-05-06 ENCOUNTER — Other Ambulatory Visit: Payer: Self-pay

## 2023-05-06 DIAGNOSIS — D469 Myelodysplastic syndrome, unspecified: Secondary | ICD-10-CM

## 2023-05-06 LAB — TYPE AND SCREEN
ABO/RH(D): A POS
Antibody Screen: NEGATIVE
Unit division: 0

## 2023-05-06 LAB — BPAM RBC
Blood Product Expiration Date: 202408112359
ISSUE DATE / TIME: 202407181126
Unit Type and Rh: 6200

## 2023-05-11 ENCOUNTER — Encounter: Payer: Self-pay | Admitting: Hematology

## 2023-05-12 ENCOUNTER — Other Ambulatory Visit: Payer: Self-pay

## 2023-05-12 ENCOUNTER — Inpatient Hospital Stay: Payer: Medicare Other

## 2023-05-12 ENCOUNTER — Other Ambulatory Visit: Payer: Self-pay | Admitting: *Deleted

## 2023-05-12 ENCOUNTER — Telehealth: Payer: Self-pay | Admitting: *Deleted

## 2023-05-12 ENCOUNTER — Ambulatory Visit (HOSPITAL_BASED_OUTPATIENT_CLINIC_OR_DEPARTMENT_OTHER): Payer: Medicare Other | Admitting: Physician Assistant

## 2023-05-12 DIAGNOSIS — D469 Myelodysplastic syndrome, unspecified: Secondary | ICD-10-CM

## 2023-05-12 DIAGNOSIS — K449 Diaphragmatic hernia without obstruction or gangrene: Secondary | ICD-10-CM | POA: Diagnosis not present

## 2023-05-12 DIAGNOSIS — R5383 Other fatigue: Secondary | ICD-10-CM | POA: Diagnosis not present

## 2023-05-12 DIAGNOSIS — D72829 Elevated white blood cell count, unspecified: Secondary | ICD-10-CM | POA: Diagnosis not present

## 2023-05-12 DIAGNOSIS — N202 Calculus of kidney with calculus of ureter: Secondary | ICD-10-CM | POA: Diagnosis not present

## 2023-05-12 DIAGNOSIS — Z87891 Personal history of nicotine dependence: Secondary | ICD-10-CM | POA: Diagnosis not present

## 2023-05-12 DIAGNOSIS — E785 Hyperlipidemia, unspecified: Secondary | ICD-10-CM | POA: Diagnosis not present

## 2023-05-12 DIAGNOSIS — M79674 Pain in right toe(s): Secondary | ICD-10-CM | POA: Diagnosis not present

## 2023-05-12 DIAGNOSIS — M109 Gout, unspecified: Secondary | ICD-10-CM | POA: Diagnosis not present

## 2023-05-12 DIAGNOSIS — M1000 Idiopathic gout, unspecified site: Secondary | ICD-10-CM | POA: Diagnosis not present

## 2023-05-12 DIAGNOSIS — R161 Splenomegaly, not elsewhere classified: Secondary | ICD-10-CM | POA: Diagnosis not present

## 2023-05-12 DIAGNOSIS — D696 Thrombocytopenia, unspecified: Secondary | ICD-10-CM | POA: Diagnosis not present

## 2023-05-12 DIAGNOSIS — Z86010 Personal history of colonic polyps: Secondary | ICD-10-CM | POA: Diagnosis not present

## 2023-05-12 DIAGNOSIS — R600 Localized edema: Secondary | ICD-10-CM | POA: Diagnosis not present

## 2023-05-12 DIAGNOSIS — K219 Gastro-esophageal reflux disease without esophagitis: Secondary | ICD-10-CM | POA: Diagnosis not present

## 2023-05-12 DIAGNOSIS — J90 Pleural effusion, not elsewhere classified: Secondary | ICD-10-CM | POA: Diagnosis not present

## 2023-05-12 DIAGNOSIS — Z79899 Other long term (current) drug therapy: Secondary | ICD-10-CM | POA: Diagnosis not present

## 2023-05-12 DIAGNOSIS — Z7982 Long term (current) use of aspirin: Secondary | ICD-10-CM | POA: Diagnosis not present

## 2023-05-12 DIAGNOSIS — Z7961 Long term (current) use of immunomodulator: Secondary | ICD-10-CM | POA: Diagnosis not present

## 2023-05-12 DIAGNOSIS — R109 Unspecified abdominal pain: Secondary | ICD-10-CM | POA: Diagnosis not present

## 2023-05-12 DIAGNOSIS — D464 Refractory anemia, unspecified: Secondary | ICD-10-CM | POA: Diagnosis not present

## 2023-05-12 LAB — CBC WITH DIFFERENTIAL (CANCER CENTER ONLY)
Abs Immature Granulocytes: 0.71 10*3/uL — ABNORMAL HIGH (ref 0.00–0.07)
Basophils Absolute: 0.3 10*3/uL — ABNORMAL HIGH (ref 0.0–0.1)
Basophils Relative: 2 %
Eosinophils Absolute: 2 10*3/uL — ABNORMAL HIGH (ref 0.0–0.5)
Eosinophils Relative: 17 %
HCT: 20.1 % — ABNORMAL LOW (ref 39.0–52.0)
Hemoglobin: 6.6 g/dL — CL (ref 13.0–17.0)
Immature Granulocytes: 6 %
Lymphocytes Relative: 5 %
Lymphs Abs: 0.6 10*3/uL — ABNORMAL LOW (ref 0.7–4.0)
MCH: 28.7 pg (ref 26.0–34.0)
MCHC: 32.8 g/dL (ref 30.0–36.0)
MCV: 87.4 fL (ref 80.0–100.0)
Monocytes Absolute: 0.5 10*3/uL (ref 0.1–1.0)
Monocytes Relative: 5 %
Neutro Abs: 7.6 10*3/uL (ref 1.7–7.7)
Neutrophils Relative %: 65 %
Platelet Count: 287 10*3/uL (ref 150–400)
RBC: 2.3 MIL/uL — ABNORMAL LOW (ref 4.22–5.81)
RDW: 15.6 % — ABNORMAL HIGH (ref 11.5–15.5)
WBC Count: 11.8 10*3/uL — ABNORMAL HIGH (ref 4.0–10.5)
nRBC: 0 % (ref 0.0–0.2)

## 2023-05-12 LAB — TYPE AND SCREEN
ABO/RH(D): A POS
Antibody Screen: NEGATIVE
Unit division: 0

## 2023-05-12 LAB — CMP (CANCER CENTER ONLY)
ALT: 24 U/L (ref 0–44)
AST: 10 U/L — ABNORMAL LOW (ref 15–41)
Albumin: 3.7 g/dL (ref 3.5–5.0)
Alkaline Phosphatase: 41 U/L (ref 38–126)
Anion gap: 8 (ref 5–15)
BUN: 18 mg/dL (ref 8–23)
CO2: 24 mmol/L (ref 22–32)
Calcium: 8.5 mg/dL — ABNORMAL LOW (ref 8.9–10.3)
Chloride: 103 mmol/L (ref 98–111)
Creatinine: 0.94 mg/dL (ref 0.61–1.24)
GFR, Estimated: >60 mL/min (ref >60)
Glucose, Bld: 219 mg/dL — ABNORMAL HIGH (ref 70–99)
Potassium: 4.2 mmol/L (ref 3.5–5.1)
Sodium: 135 mmol/L (ref 135–145)
Total Bilirubin: 0.8 mg/dL (ref 0.3–1.2)
Total Protein: 5.4 g/dL — ABNORMAL LOW (ref 6.5–8.1)

## 2023-05-12 LAB — SAMPLE TO BLOOD BANK

## 2023-05-12 LAB — PREPARE RBC (CROSSMATCH)

## 2023-05-12 LAB — BPAM RBC
Blood Product Expiration Date: 202408162359
ISSUE DATE / TIME: 202407251018
Unit Type and Rh: 6200

## 2023-05-12 MED ORDER — ACETAMINOPHEN 325 MG PO TABS
650.0000 mg | ORAL_TABLET | Freq: Once | ORAL | Status: AC
  Administered 2023-05-12: 650 mg via ORAL
  Filled 2023-05-12: qty 2

## 2023-05-12 MED ORDER — PREDNISONE 20 MG PO TABS: 20.0000 mg | ORAL_TABLET | Freq: Every day | ORAL | 0 refills | Status: AC

## 2023-05-12 MED ORDER — HEPARIN SOD (PORK) LOCK FLUSH 100 UNIT/ML IV SOLN
250.0000 [IU] | INTRAVENOUS | Status: AC | PRN
  Administered 2023-05-12: 500 [IU]

## 2023-05-12 MED ORDER — METHYLPREDNISOLONE SODIUM SUCC 40 MG IJ SOLR
40.0000 mg | Freq: Once | INTRAMUSCULAR | Status: AC
  Administered 2023-05-12: 40 mg via INTRAVENOUS
  Filled 2023-05-12: qty 1

## 2023-05-12 MED ORDER — SODIUM CHLORIDE 0.9% FLUSH
10.0000 mL | Freq: Once | INTRAVENOUS | Status: AC
  Administered 2023-05-12: 10 mL

## 2023-05-12 MED ORDER — SODIUM CHLORIDE 0.9% IV SOLUTION
250.0000 mL | Freq: Once | INTRAVENOUS | Status: AC
  Administered 2023-05-12: 250 mL via INTRAVENOUS

## 2023-05-12 MED ORDER — SODIUM CHLORIDE 0.9% FLUSH
10.0000 mL | INTRAVENOUS | Status: AC | PRN
  Administered 2023-05-12: 10 mL

## 2023-05-12 NOTE — Telephone Encounter (Signed)
CRITICAL VALUE STICKER  CRITICAL VALUE:  HGB 6.6  RECEIVER (on-site recipient of call): Henrene Dodge, RN  DATE & TIME NOTIFIED: 05/12/23 @ 0835  MESSENGER (representative from lab): Byrd Hesselbach  MD NOTIFIED: Georga Kaufmann, PA   TIME OF NOTIFICATION: [id number]  RESPONSE: Info acknowledged - no new orders received. Patient here for weekly labs and previously scheduled for PRBCs today if HGB < 7.5

## 2023-05-12 NOTE — Progress Notes (Signed)
Symptom Management Consult Note [facility]    Patient Care Team: Jackelyn Poling, DO as PCP - General (Family Medicine)    Name / MRN / DOB: Fernando Lee  [id number]  11-02-1945   Date of visit: 05/12/2023   Chief Complaint/Reason for visit: Right knee pain   Current Therapy: 10 MG Revlimid 3 weeks on 1 week off     ASSESSMENT & PLAN: Patient is a 77 y.o. 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 Candise Che.  I have viewed most recent oncology note and lab work.    #JAK2 positive myeloproliferative neoplasm/MDS  - Here for scheduled blood transfusion today - next appointment with oncologist is 06/09/23   #Right knee pain - Chart review shows patient seen in the ED for the same on 04/23/23. Xray was negative for fracture or effusion. He was treated with colchicine and symptoms resolved.  Patient was also seen by me 03/10/2023 for gout symptoms of his right toe.  Symptoms resolved at that time with steroids. - Symptoms are again suggestive of gout. Exam not consistent with septic joint. From an oncology standpoint it is best to avoid colchicine if possible as to not reduce his blood counts. - Patient currently on prednisone taper, still has 27 days left. To treat this gout flare I will add 5 day course of 20 mg in addition to the 10 mg he is on for total of 30 mg daily. 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 Will have patient discuss this further with oncologist at upcoming appointment.  Strict ED precautions discussed should symptoms worsen.    Heme/Onc History: Oncology History  MDS (myelodysplastic syndrome) (HCC)  05/26/2022 Initial Diagnosis   MDS (myelodysplastic syndrome) (HCC)   05/31/2022 - 08/11/2022 Chemotherapy   Patient is on Treatment Plan : MYELODYSPLASIA Luspatercept q21d     09/06/2022 -  Chemotherapy   Patient is on Treatment Plan : MYELODYSPLASIA  Azacitidine IV D1-5 q28d          Interval history-: Fernando Lee is a 77 y.o. male with oncologic history as above presenting to [facility] today with chief complaint of right knee pain x 1 day.  Patient states his right knee began hurting yesterday.  The pain is sharp.  He rates it 7 out of 10 in severity.  Pain does not radiate.  Pain is worse when walking upstairs.  He has not had any falls or injury.  Patient denies any fever or leg swelling.  Patient states this feels similar to when he had gout in the past.  Patient seen in the ED for similar symptoms on 04/23/2023.  He was treated with colchicine.  He was encouraged to follow-up with PCP or orthopedics however because of symptom resolution he did not feel that was necessary.  Patient did not take any over-the-counter medications prior to arrival today.  He will receive Tylenol and Solu-Medrol as premedications for his blood transfusion today.     ROS  All other systems are reviewed and are negative for acute change except as noted in the HPI.    No Known Allergies   Past Medical History:  Diagnosis Date   Arthritis of right knee    Cervicalgia    Chest discomfort    normal stress echo   Chest pain    Colon polyps    Dyslipidemia    Fluttering sensation of heart    GERD without esophagitis  Hematochezia    Hyperglycemia    Hyperlipidemia    Internal hemorrhoids    Kidney stones    Male erectile dysfunction, unspecified    Mixed dyslipidemia    Mixed hyperlipidemia    Ocular migraine    PAC (premature atrial contraction)    Personal history of colonic polyps    Prediabetes    Residual hemorrhoidal skin tags    Spleen enlarged    nov 2023 hospitalized   Unspecified hemorrhoids      Past Surgical History:  Procedure Laterality Date   BONE MARROW BIOPSY     IR IMAGING GUIDED PORT INSERTION  09/14/2022   KIDNEY STONE SURGERY     retrieval    Social History   Socioeconomic History   Marital status: Married    Spouse name: Not on file    Number of children: Not on file   Years of education: Not on file   Highest education level: Not on file  Occupational History   Not on file  Tobacco Use   Smoking status: Former    Types: Cigarettes   Smokeless tobacco: Never  Substance and Sexual Activity   Alcohol use: Yes    Alcohol/week: 1.0 standard drink of alcohol    Types: 1 Cans of beer per week   Drug use: Not on file   Sexual activity: Not on file  Other Topics Concern   Not on file  Social History Narrative   Not on file   Social Determinants of Health   Financial Resource Strain: Not on file  Food Insecurity: Unknown (08/31/2022)   Hunger Vital Sign    Worried About Running Out of Food in the Last Year: Never true    Ran Out of Food in the Last Year: Not on file  Transportation Needs: No Transportation Needs (08/31/2022)   PRAPARE - Administrator, Civil Service (Medical): No    Lack of Transportation (Non-Medical): No  Physical Activity: Not on file  Stress: Not on file  Social Connections: Not on file  Intimate Partner Violence: Not on file    Family History  Problem Relation Age of Onset   Stroke Brother    Congestive Heart Failure Brother      Current Outpatient Medications:    [START ON 05/13/2023] predniSONE (DELTASONE) 20 MG tablet, Take 1 tablet (20 mg total) by mouth daily with breakfast for 5 days., Disp: 5 tablet, Rfl: 0   ALFUZOSIN HCL ER PO, Take 1 tablet by mouth daily after supper., Disp: , Rfl:    aspirin EC 81 MG tablet, Take 81 mg by mouth daily. Swallow whole., Disp: , Rfl:    B Complex Vitamins (B COMPLEX PO), Take 1 tablet by mouth daily., Disp: , Rfl:    Cholecalciferol (VITAMIN D3) 50 MCG (2000 UT) TABS, Take 2,000 Units by mouth daily., Disp: , Rfl:    colchicine 0.6 MG tablet, Take 2 tablets (1.2 mg), followed by 1 tablet (0.6 mg) 1 hour later. Wait 3 days before starting next round., Disp: 6 tablet, Rfl: 0   docusate sodium (COLACE) 100 MG capsule, Take 100 mg by  mouth 2 (two) times daily., Disp: , Rfl:    doxycycline (VIBRAMYCIN) 100 MG capsule, Take 1 capsule (100 mg total) by mouth 2 (two) times daily., Disp: 20 capsule, Rfl: 0   FIBER PO, Take 1 capsule by mouth daily. Unknown strength, Disp: , Rfl:    fish oil-omega-3 fatty acids 1000 MG capsule, Take 1 g  by mouth 2 (two) times daily. Strength 300mg /1500mg , Disp: , Rfl:    Flaxseed, Linseed, (FLAXSEED OIL) 1000 MG CAPS, Take 1,000 mg by mouth daily., Disp: , Rfl:    GLUCOSAMINE CHONDROITIN MSM PO, Take 1 tablet by mouth daily. Strength 1500/1500, Disp: , Rfl:    lidocaine-prilocaine (EMLA) cream, Apply to affected area once (Patient taking differently: Apply 1 Application topically once. Apply to affected area once), Disp: 30 g, Rfl: 3   Misc Natural Products (PROSTATE SUPPORT PO), Take 1 capsule by mouth daily. Unknown strength, Disp: , Rfl:    Multiple Vitamin (MULTIVITAMIN) tablet, Take 1 tablet by mouth daily. Unknown strength, Disp: , Rfl:    ondansetron (ZOFRAN) 8 MG tablet, Take 1 tablet (8 mg total) by mouth every 8 (eight) hours as needed for nausea or vomiting., Disp: 30 tablet, Rfl: 1   predniSONE (DELTASONE) 20 MG tablet, Take 3 tablets (60 mg total) by mouth daily with breakfast for 5 days, THEN 2 tablets (40 mg total) daily with breakfast for 5 days, THEN 1 tablet (20 mg total) daily with breakfast for 15 days, THEN 0.5 tablets (10 mg total) daily with breakfast for 15 days, THEN 0.5 tablets (10 mg total) every other day for 20 days., Disp: 52.5 tablet, Rfl: 0   prochlorperazine (COMPAZINE) 10 MG tablet, Take 1 tablet (10 mg total) by mouth every 6 (six) hours as needed for nausea or vomiting., Disp: 30 tablet, Rfl: 1   psyllium (METAMUCIL) 58.6 % powder, Take 1 packet by mouth daily., Disp: , Rfl:    REVLIMID 10 MG capsule, Take 1 capsule (10 mg total) by mouth daily. Take for 21 days on, then 7 days off. Repeat every 28 days., Disp: 21 capsule, Rfl: 0   senna-docusate (SENNA S) 8.6-50 MG  tablet, Take 2 tablets by mouth at bedtime., Disp: 60 tablet, Rfl: 1   traMADol (ULTRAM) 50 MG tablet, Take 50 mg by mouth as needed for moderate pain or severe pain., Disp: , Rfl:    Turmeric (QC TUMERIC COMPLEX PO), Take 750 mg by mouth daily., Disp: , Rfl:    vitamin C (ASCORBIC ACID) 250 MG tablet, Take 500 mg by mouth daily., Disp: , Rfl:   PHYSICAL EXAM: ECOG FS:1 - Symptomatic but completely ambulatory   T: 98.1 109/52  HR: 67  Resp: 18  SpO2: 100% Physical Exam Vitals and nursing note reviewed.  Constitutional:      Appearance: He is not ill-appearing or toxic-appearing.  HENT:     Head: Normocephalic.  Eyes:     Conjunctiva/sclera: Conjunctivae normal.  Cardiovascular:     Rate and Rhythm: Normal rate.  Pulmonary:     Effort: Pulmonary effort is normal.  Abdominal:     General: There is no distension.  Musculoskeletal:     Cervical back: Normal range of motion.     Comments: Full ROM of BLE.   Tenderness to palpation of right patella. No overlying erythema or break in the skin. No swelling appreciated.  No calf tenderness or palpable cords.  Skin:    General: Skin is warm and dry.     Comments: Equal tactile temperature in BLE  Neurological:     Mental Status: He is alert.        LABORATORY DATA: I have reviewed the data as listed    Latest Ref Rng & Units 05/12/2023    7:54 AM 05/05/2023    9:29 AM 05/03/2023    8:50 AM  CBC  WBC  4.0 - 10.5 K/uL 11.8  7.6  8.4   Hemoglobin 13.0 - 17.0 g/dL 6.6  7.0  6.1   Hematocrit 39.0 - 52.0 % 20.1  20.7  18.2   Platelets 150 - 400 K/uL 287  309  340         Latest Ref Rng & Units 05/12/2023    7:54 AM 05/05/2023    9:29 AM 05/03/2023    8:50 AM  CMP  Glucose 70 - 99 mg/dL 528  413  244   BUN 8 - 23 mg/dL 18  22  21    Creatinine 0.61 - 1.24 mg/dL 0.10  2.72  5.36   Sodium 135 - 145 mmol/L 135  136  135   Potassium 3.5 - 5.1 mmol/L 4.2  4.4  4.1   Chloride 98 - 111 mmol/L 103  103  103   CO2 22 - 32 mmol/L 24   28  26    Calcium 8.9 - 10.3 mg/dL 8.5  8.8  8.8   Total Protein 6.5 - 8.1 g/dL 5.4  5.7  5.7   Total Bilirubin 0.3 - 1.2 mg/dL 0.8  0.9  0.8   Alkaline Phos 38 - 126 U/L 41  35  36   AST 15 - 41 U/L 10  11  13    ALT 0 - 44 U/L 24  28  35        RADIOGRAPHIC STUDIES (from last 24 hours if applicable) I have personally reviewed the radiological images as listed and agreed with the findings in the report. No results found.      Visit Diagnosis: 1. MDS (myelodysplastic syndrome) (HCC)   2. Acute idiopathic gout, unspecified site      No orders of the defined types were placed in this encounter.   All questions were answered. The patient knows to call the clinic with any problems, questions or concerns. No barriers to learning was detected.  A total of more than 30 minutes were spent on this encounter with face-to-face time and non-face-to-face time, including preparing to see the patient, ordering medications, counseling the patient and coordination of care as outlined above.    Thank you for allowing me to participate in the care of this patient.    Shanon Ace, PA-C Department of Hematology/Oncology [facility] at [facility] Phone: [phone]  [phone]    05/12/2023 4:19 PM

## 2023-05-13 ENCOUNTER — Other Ambulatory Visit: Payer: Self-pay

## 2023-05-18 ENCOUNTER — Other Ambulatory Visit: Payer: Self-pay

## 2023-05-18 ENCOUNTER — Inpatient Hospital Stay: Payer: Medicare Other

## 2023-05-18 VITALS — BP 102/51 | HR 68 | Temp 97.8°F | Resp 18 | Wt 156.6 lb

## 2023-05-18 DIAGNOSIS — K219 Gastro-esophageal reflux disease without esophagitis: Secondary | ICD-10-CM | POA: Diagnosis not present

## 2023-05-18 DIAGNOSIS — D469 Myelodysplastic syndrome, unspecified: Secondary | ICD-10-CM

## 2023-05-18 DIAGNOSIS — D696 Thrombocytopenia, unspecified: Secondary | ICD-10-CM | POA: Diagnosis not present

## 2023-05-18 DIAGNOSIS — Z7961 Long term (current) use of immunomodulator: Secondary | ICD-10-CM | POA: Diagnosis not present

## 2023-05-18 DIAGNOSIS — D464 Refractory anemia, unspecified: Secondary | ICD-10-CM | POA: Diagnosis not present

## 2023-05-18 DIAGNOSIS — N202 Calculus of kidney with calculus of ureter: Secondary | ICD-10-CM | POA: Diagnosis not present

## 2023-05-18 DIAGNOSIS — J90 Pleural effusion, not elsewhere classified: Secondary | ICD-10-CM | POA: Diagnosis not present

## 2023-05-18 DIAGNOSIS — R161 Splenomegaly, not elsewhere classified: Secondary | ICD-10-CM | POA: Diagnosis not present

## 2023-05-18 DIAGNOSIS — E785 Hyperlipidemia, unspecified: Secondary | ICD-10-CM | POA: Diagnosis not present

## 2023-05-18 DIAGNOSIS — Z79899 Other long term (current) drug therapy: Secondary | ICD-10-CM | POA: Diagnosis not present

## 2023-05-18 DIAGNOSIS — K449 Diaphragmatic hernia without obstruction or gangrene: Secondary | ICD-10-CM | POA: Diagnosis not present

## 2023-05-18 DIAGNOSIS — Z7982 Long term (current) use of aspirin: Secondary | ICD-10-CM | POA: Diagnosis not present

## 2023-05-18 DIAGNOSIS — M109 Gout, unspecified: Secondary | ICD-10-CM | POA: Diagnosis not present

## 2023-05-18 DIAGNOSIS — R109 Unspecified abdominal pain: Secondary | ICD-10-CM | POA: Diagnosis not present

## 2023-05-18 DIAGNOSIS — Z87891 Personal history of nicotine dependence: Secondary | ICD-10-CM | POA: Diagnosis not present

## 2023-05-18 DIAGNOSIS — R600 Localized edema: Secondary | ICD-10-CM | POA: Diagnosis not present

## 2023-05-18 DIAGNOSIS — Z86010 Personal history of colonic polyps: Secondary | ICD-10-CM | POA: Diagnosis not present

## 2023-05-18 DIAGNOSIS — D72829 Elevated white blood cell count, unspecified: Secondary | ICD-10-CM | POA: Diagnosis not present

## 2023-05-18 DIAGNOSIS — R5383 Other fatigue: Secondary | ICD-10-CM | POA: Diagnosis not present

## 2023-05-18 DIAGNOSIS — M79674 Pain in right toe(s): Secondary | ICD-10-CM | POA: Diagnosis not present

## 2023-05-18 LAB — CBC WITH DIFFERENTIAL (CANCER CENTER ONLY)
Abs Immature Granulocytes: 0.63 10*3/uL — ABNORMAL HIGH (ref 0.00–0.07)
Basophils Absolute: 0.3 10*3/uL — ABNORMAL HIGH (ref 0.0–0.1)
Basophils Relative: 3 %
Eosinophils Absolute: 3.1 10*3/uL — ABNORMAL HIGH (ref 0.0–0.5)
Eosinophils Relative: 31 %
HCT: 18.6 % — ABNORMAL LOW (ref 39.0–52.0)
Hemoglobin: 6.4 g/dL — CL (ref 13.0–17.0)
Immature Granulocytes: 6 %
Lymphocytes Relative: 8 %
Lymphs Abs: 0.8 10*3/uL (ref 0.7–4.0)
MCH: 29.2 pg (ref 26.0–34.0)
MCHC: 34.4 g/dL (ref 30.0–36.0)
MCV: 84.9 fL (ref 80.0–100.0)
Monocytes Absolute: 0.9 10*3/uL (ref 0.1–1.0)
Monocytes Relative: 9 %
Neutro Abs: 4.4 10*3/uL (ref 1.7–7.7)
Neutrophils Relative %: 43 %
Platelet Count: 288 10*3/uL (ref 150–400)
RBC: 2.19 MIL/uL — ABNORMAL LOW (ref 4.22–5.81)
RDW: 15.1 % (ref 11.5–15.5)
Smear Review: NORMAL
WBC Count: 10.1 10*3/uL (ref 4.0–10.5)
nRBC: 0 % (ref 0.0–0.2)

## 2023-05-18 LAB — TYPE AND SCREEN
ABO/RH(D): A POS
Antibody Screen: NEGATIVE
Unit division: 0

## 2023-05-18 LAB — BPAM RBC
Blood Product Expiration Date: 202408232359
ISSUE DATE / TIME: 202407311520
Unit Type and Rh: 6200

## 2023-05-18 LAB — SAMPLE TO BLOOD BANK

## 2023-05-18 LAB — PREPARE RBC (CROSSMATCH)

## 2023-05-18 MED ORDER — SODIUM CHLORIDE 0.9% FLUSH
10.0000 mL | Freq: Once | INTRAVENOUS | Status: AC
  Administered 2023-05-18: 10 mL

## 2023-05-18 MED ORDER — ACETAMINOPHEN 325 MG PO TABS
650.0000 mg | ORAL_TABLET | Freq: Once | ORAL | Status: AC
  Administered 2023-05-18: 650 mg via ORAL
  Filled 2023-05-18: qty 2

## 2023-05-18 MED ORDER — METHYLPREDNISOLONE SODIUM SUCC 40 MG IJ SOLR
40.0000 mg | Freq: Once | INTRAMUSCULAR | Status: AC
  Administered 2023-05-18: 40 mg via INTRAVENOUS
  Filled 2023-05-18: qty 1

## 2023-05-18 MED ORDER — HEPARIN SOD (PORK) LOCK FLUSH 100 UNIT/ML IV SOLN
500.0000 [IU] | Freq: Once | INTRAVENOUS | Status: AC
  Administered 2023-05-18: 500 [IU]

## 2023-05-18 MED ORDER — SODIUM CHLORIDE 0.9% IV SOLUTION
250.0000 mL | Freq: Once | INTRAVENOUS | Status: AC
  Administered 2023-05-18: 250 mL via INTRAVENOUS

## 2023-05-18 NOTE — Patient Instructions (Signed)

## 2023-05-19 ENCOUNTER — Inpatient Hospital Stay: Payer: Medicare Other | Attending: Hematology

## 2023-05-19 ENCOUNTER — Other Ambulatory Visit: Payer: Self-pay

## 2023-05-19 ENCOUNTER — Inpatient Hospital Stay: Payer: Medicare Other

## 2023-05-19 DIAGNOSIS — Z7961 Long term (current) use of immunomodulator: Secondary | ICD-10-CM | POA: Insufficient documentation

## 2023-05-19 DIAGNOSIS — Z87891 Personal history of nicotine dependence: Secondary | ICD-10-CM | POA: Diagnosis not present

## 2023-05-19 DIAGNOSIS — D469 Myelodysplastic syndrome, unspecified: Secondary | ICD-10-CM

## 2023-05-19 DIAGNOSIS — D539 Nutritional anemia, unspecified: Secondary | ICD-10-CM | POA: Diagnosis not present

## 2023-05-19 DIAGNOSIS — J90 Pleural effusion, not elsewhere classified: Secondary | ICD-10-CM | POA: Insufficient documentation

## 2023-05-19 DIAGNOSIS — E785 Hyperlipidemia, unspecified: Secondary | ICD-10-CM | POA: Insufficient documentation

## 2023-05-19 DIAGNOSIS — D464 Refractory anemia, unspecified: Secondary | ICD-10-CM | POA: Diagnosis present

## 2023-05-19 DIAGNOSIS — N202 Calculus of kidney with calculus of ureter: Secondary | ICD-10-CM | POA: Diagnosis not present

## 2023-05-19 DIAGNOSIS — D72829 Elevated white blood cell count, unspecified: Secondary | ICD-10-CM | POA: Diagnosis not present

## 2023-05-19 DIAGNOSIS — Z87442 Personal history of urinary calculi: Secondary | ICD-10-CM | POA: Diagnosis not present

## 2023-05-19 DIAGNOSIS — R464 Slowness and poor responsiveness: Secondary | ICD-10-CM | POA: Insufficient documentation

## 2023-05-19 DIAGNOSIS — K219 Gastro-esophageal reflux disease without esophagitis: Secondary | ICD-10-CM | POA: Insufficient documentation

## 2023-05-19 DIAGNOSIS — Z79899 Other long term (current) drug therapy: Secondary | ICD-10-CM | POA: Diagnosis not present

## 2023-05-19 DIAGNOSIS — R161 Splenomegaly, not elsewhere classified: Secondary | ICD-10-CM | POA: Insufficient documentation

## 2023-05-19 DIAGNOSIS — D75839 Thrombocytosis, unspecified: Secondary | ICD-10-CM | POA: Insufficient documentation

## 2023-05-19 DIAGNOSIS — R600 Localized edema: Secondary | ICD-10-CM | POA: Diagnosis not present

## 2023-05-19 DIAGNOSIS — I7090 Unspecified atherosclerosis: Secondary | ICD-10-CM | POA: Diagnosis not present

## 2023-05-19 DIAGNOSIS — K449 Diaphragmatic hernia without obstruction or gangrene: Secondary | ICD-10-CM | POA: Diagnosis not present

## 2023-05-19 LAB — PREPARE RBC (CROSSMATCH)

## 2023-05-19 MED ORDER — ACETAMINOPHEN 325 MG PO TABS
650.0000 mg | ORAL_TABLET | Freq: Once | ORAL | Status: AC
  Administered 2023-05-19: 650 mg via ORAL
  Filled 2023-05-19: qty 2

## 2023-05-19 MED ORDER — SODIUM CHLORIDE 0.9% IV SOLUTION
250.0000 mL | Freq: Once | INTRAVENOUS | Status: AC
  Administered 2023-05-19: 250 mL via INTRAVENOUS

## 2023-05-19 MED ORDER — METHYLPREDNISOLONE SODIUM SUCC 40 MG IJ SOLR
40.0000 mg | Freq: Once | INTRAMUSCULAR | Status: AC
  Administered 2023-05-19: 40 mg via INTRAVENOUS
  Filled 2023-05-19: qty 1

## 2023-05-24 ENCOUNTER — Other Ambulatory Visit: Payer: Self-pay

## 2023-05-24 ENCOUNTER — Ambulatory Visit
Admission: RE | Admit: 2023-05-24 | Discharge: 2023-05-24 | Disposition: A | Discharge status: Home / Self Care | Payer: Medicare Other | Source: Ambulatory Visit | Attending: Family Medicine | Admitting: Family Medicine

## 2023-05-24 ENCOUNTER — Other Ambulatory Visit: Payer: Self-pay | Admitting: Family Medicine

## 2023-05-24 DIAGNOSIS — M25561 Pain in right knee: Secondary | ICD-10-CM

## 2023-05-24 DIAGNOSIS — D469 Myelodysplastic syndrome, unspecified: Secondary | ICD-10-CM

## 2023-05-24 MED ORDER — REVLIMID 10 MG PO CAPS
10.0000 mg | ORAL_CAPSULE | Freq: Every day | ORAL | 0 refills | Status: DC
Start: 2023-05-24 — End: 2023-07-13

## 2023-05-25 ENCOUNTER — Other Ambulatory Visit: Payer: Self-pay

## 2023-05-25 DIAGNOSIS — D469 Myelodysplastic syndrome, unspecified: Secondary | ICD-10-CM

## 2023-05-26 ENCOUNTER — Telehealth: Payer: Self-pay

## 2023-05-26 ENCOUNTER — Other Ambulatory Visit: Payer: Self-pay

## 2023-05-26 ENCOUNTER — Inpatient Hospital Stay: Payer: Medicare Other

## 2023-05-26 DIAGNOSIS — D75839 Thrombocytosis, unspecified: Secondary | ICD-10-CM | POA: Diagnosis not present

## 2023-05-26 DIAGNOSIS — Z87891 Personal history of nicotine dependence: Secondary | ICD-10-CM | POA: Diagnosis not present

## 2023-05-26 DIAGNOSIS — Z87442 Personal history of urinary calculi: Secondary | ICD-10-CM | POA: Diagnosis not present

## 2023-05-26 DIAGNOSIS — Z79899 Other long term (current) drug therapy: Secondary | ICD-10-CM | POA: Diagnosis not present

## 2023-05-26 DIAGNOSIS — D469 Myelodysplastic syndrome, unspecified: Secondary | ICD-10-CM

## 2023-05-26 DIAGNOSIS — I7090 Unspecified atherosclerosis: Secondary | ICD-10-CM | POA: Diagnosis not present

## 2023-05-26 DIAGNOSIS — K449 Diaphragmatic hernia without obstruction or gangrene: Secondary | ICD-10-CM | POA: Diagnosis not present

## 2023-05-26 DIAGNOSIS — R464 Slowness and poor responsiveness: Secondary | ICD-10-CM | POA: Diagnosis not present

## 2023-05-26 DIAGNOSIS — R600 Localized edema: Secondary | ICD-10-CM | POA: Diagnosis not present

## 2023-05-26 DIAGNOSIS — N202 Calculus of kidney with calculus of ureter: Secondary | ICD-10-CM | POA: Diagnosis not present

## 2023-05-26 DIAGNOSIS — E785 Hyperlipidemia, unspecified: Secondary | ICD-10-CM | POA: Diagnosis not present

## 2023-05-26 DIAGNOSIS — J90 Pleural effusion, not elsewhere classified: Secondary | ICD-10-CM | POA: Diagnosis not present

## 2023-05-26 DIAGNOSIS — D72829 Elevated white blood cell count, unspecified: Secondary | ICD-10-CM | POA: Diagnosis not present

## 2023-05-26 DIAGNOSIS — Z7961 Long term (current) use of immunomodulator: Secondary | ICD-10-CM | POA: Diagnosis not present

## 2023-05-26 DIAGNOSIS — R161 Splenomegaly, not elsewhere classified: Secondary | ICD-10-CM | POA: Diagnosis not present

## 2023-05-26 DIAGNOSIS — K219 Gastro-esophageal reflux disease without esophagitis: Secondary | ICD-10-CM | POA: Diagnosis not present

## 2023-05-26 DIAGNOSIS — D539 Nutritional anemia, unspecified: Secondary | ICD-10-CM | POA: Diagnosis not present

## 2023-05-26 LAB — CMP (CANCER CENTER ONLY)
ALT: 53 U/L — ABNORMAL HIGH (ref 0–44)
AST: 16 U/L (ref 15–41)
Albumin: 3.8 g/dL (ref 3.5–5.0)
Alkaline Phosphatase: 39 U/L (ref 38–126)
Anion gap: 7 (ref 5–15)
BUN: 19 mg/dL (ref 8–23)
CO2: 25 mmol/L (ref 22–32)
Calcium: 8.4 mg/dL — ABNORMAL LOW (ref 8.9–10.3)
Chloride: 105 mmol/L (ref 98–111)
Creatinine: 0.95 mg/dL (ref 0.61–1.24)
GFR, Estimated: >60 mL/min (ref >60)
Glucose, Bld: 203 mg/dL — ABNORMAL HIGH (ref 70–99)
Potassium: 4 mmol/L (ref 3.5–5.1)
Sodium: 137 mmol/L (ref 135–145)
Total Bilirubin: 0.9 mg/dL (ref 0.3–1.2)
Total Protein: 5.7 g/dL — ABNORMAL LOW (ref 6.5–8.1)

## 2023-05-26 LAB — CBC WITH DIFFERENTIAL (CANCER CENTER ONLY)
Abs Immature Granulocytes: 0.37 10*3/uL — ABNORMAL HIGH (ref 0.00–0.07)
Basophils Absolute: 0.3 10*3/uL — ABNORMAL HIGH (ref 0.0–0.1)
Basophils Relative: 3 %
Eosinophils Absolute: 2.5 10*3/uL — ABNORMAL HIGH (ref 0.0–0.5)
Eosinophils Relative: 29 %
HCT: 19.5 % — ABNORMAL LOW (ref 39.0–52.0)
Hemoglobin: 6.6 g/dL — CL (ref 13.0–17.0)
Immature Granulocytes: 4 %
Lymphocytes Relative: 8 %
Lymphs Abs: 0.7 10*3/uL (ref 0.7–4.0)
MCH: 29.1 pg (ref 26.0–34.0)
MCHC: 33.8 g/dL (ref 30.0–36.0)
MCV: 85.9 fL (ref 80.0–100.0)
Monocytes Absolute: 0.8 10*3/uL (ref 0.1–1.0)
Monocytes Relative: 9 %
Neutro Abs: 4.1 10*3/uL (ref 1.7–7.7)
Neutrophils Relative %: 47 %
Platelet Count: 151 10*3/uL (ref 150–400)
RBC: 2.27 MIL/uL — ABNORMAL LOW (ref 4.22–5.81)
RDW: 15.1 % (ref 11.5–15.5)
WBC Count: 8.9 10*3/uL (ref 4.0–10.5)
nRBC: 0 % (ref 0.0–0.2)

## 2023-05-26 LAB — BPAM RBC
Blood Product Expiration Date: 202409022359
Blood Product Expiration Date: 202409022359
ISSUE DATE / TIME: 202408081029
ISSUE DATE / TIME: 202408081029
Unit Type and Rh: 6200
Unit Type and Rh: 6200

## 2023-05-26 LAB — TYPE AND SCREEN
ABO/RH(D): A POS
Antibody Screen: NEGATIVE
Unit division: 0
Unit division: 0

## 2023-05-26 LAB — PREPARE RBC (CROSSMATCH)

## 2023-05-26 LAB — SAMPLE TO BLOOD BANK

## 2023-05-26 MED ORDER — ACETAMINOPHEN 325 MG PO TABS
650.0000 mg | ORAL_TABLET | Freq: Once | ORAL | Status: AC
  Administered 2023-05-26: 650 mg via ORAL
  Filled 2023-05-26: qty 2

## 2023-05-26 MED ORDER — HEPARIN SOD (PORK) LOCK FLUSH 100 UNIT/ML IV SOLN
250.0000 [IU] | INTRAVENOUS | Status: AC | PRN
  Administered 2023-05-26: 250 [IU]

## 2023-05-26 MED ORDER — SODIUM CHLORIDE 0.9% FLUSH: 3.0000 mL | INTRAVENOUS | Status: DC | PRN

## 2023-05-26 MED ORDER — SODIUM CHLORIDE 0.9% FLUSH
10.0000 mL | INTRAVENOUS | Status: AC | PRN
  Administered 2023-05-26: 10 mL

## 2023-05-26 MED ORDER — SODIUM CHLORIDE 0.9% IV SOLUTION
250.0000 mL | Freq: Once | INTRAVENOUS | Status: AC
  Administered 2023-05-26: 250 mL via INTRAVENOUS

## 2023-05-26 MED ORDER — METHYLPREDNISOLONE SODIUM SUCC 40 MG IJ SOLR
40.0000 mg | Freq: Once | INTRAMUSCULAR | Status: AC
  Administered 2023-05-26: 40 mg via INTRAVENOUS
  Filled 2023-05-26: qty 1

## 2023-05-26 NOTE — Telephone Encounter (Signed)
CRITICAL VALUE STICKER  CRITICAL VALUE:   Hgb 6.6  RECEIVER (on-site recipient of call): Daneil Dolin, LPN  DATE & TIME NOTIFIED: 05/26/23  09:07  MESSENGER (representative from lab): Conception Oms  MD NOTIFIED: Wyvonnia Lora, MD  TIME OF NOTIFICATION: 09:09  RESPONSE:

## 2023-05-26 NOTE — Patient Instructions (Signed)

## 2023-06-01 ENCOUNTER — Other Ambulatory Visit: Payer: Self-pay

## 2023-06-01 DIAGNOSIS — D469 Myelodysplastic syndrome, unspecified: Secondary | ICD-10-CM

## 2023-06-02 ENCOUNTER — Inpatient Hospital Stay: Payer: Medicare Other

## 2023-06-02 ENCOUNTER — Other Ambulatory Visit: Payer: Self-pay

## 2023-06-02 DIAGNOSIS — Z87442 Personal history of urinary calculi: Secondary | ICD-10-CM | POA: Diagnosis not present

## 2023-06-02 DIAGNOSIS — Z79899 Other long term (current) drug therapy: Secondary | ICD-10-CM | POA: Diagnosis not present

## 2023-06-02 DIAGNOSIS — D469 Myelodysplastic syndrome, unspecified: Secondary | ICD-10-CM

## 2023-06-02 DIAGNOSIS — I7090 Unspecified atherosclerosis: Secondary | ICD-10-CM | POA: Diagnosis not present

## 2023-06-02 DIAGNOSIS — K449 Diaphragmatic hernia without obstruction or gangrene: Secondary | ICD-10-CM | POA: Diagnosis not present

## 2023-06-02 DIAGNOSIS — R464 Slowness and poor responsiveness: Secondary | ICD-10-CM | POA: Diagnosis not present

## 2023-06-02 DIAGNOSIS — N202 Calculus of kidney with calculus of ureter: Secondary | ICD-10-CM | POA: Diagnosis not present

## 2023-06-02 DIAGNOSIS — R161 Splenomegaly, not elsewhere classified: Secondary | ICD-10-CM | POA: Diagnosis not present

## 2023-06-02 DIAGNOSIS — E785 Hyperlipidemia, unspecified: Secondary | ICD-10-CM | POA: Diagnosis not present

## 2023-06-02 DIAGNOSIS — Z87891 Personal history of nicotine dependence: Secondary | ICD-10-CM | POA: Diagnosis not present

## 2023-06-02 DIAGNOSIS — D539 Nutritional anemia, unspecified: Secondary | ICD-10-CM | POA: Diagnosis not present

## 2023-06-02 DIAGNOSIS — D75839 Thrombocytosis, unspecified: Secondary | ICD-10-CM | POA: Diagnosis not present

## 2023-06-02 DIAGNOSIS — Z7961 Long term (current) use of immunomodulator: Secondary | ICD-10-CM | POA: Diagnosis not present

## 2023-06-02 DIAGNOSIS — K219 Gastro-esophageal reflux disease without esophagitis: Secondary | ICD-10-CM | POA: Diagnosis not present

## 2023-06-02 DIAGNOSIS — J90 Pleural effusion, not elsewhere classified: Secondary | ICD-10-CM | POA: Diagnosis not present

## 2023-06-02 DIAGNOSIS — D72829 Elevated white blood cell count, unspecified: Secondary | ICD-10-CM | POA: Diagnosis not present

## 2023-06-02 DIAGNOSIS — R600 Localized edema: Secondary | ICD-10-CM | POA: Diagnosis not present

## 2023-06-02 LAB — CBC WITH DIFFERENTIAL (CANCER CENTER ONLY)
Abs Immature Granulocytes: 0.34 10*3/uL — ABNORMAL HIGH (ref 0.00–0.07)
Basophils Absolute: 0.2 10*3/uL — ABNORMAL HIGH (ref 0.0–0.1)
Basophils Relative: 4 %
Eosinophils Absolute: 0.8 10*3/uL — ABNORMAL HIGH (ref 0.0–0.5)
Eosinophils Relative: 13 %
HCT: 18.8 % — ABNORMAL LOW (ref 39.0–52.0)
Hemoglobin: 6.3 g/dL — CL (ref 13.0–17.0)
Immature Granulocytes: 6 %
Lymphocytes Relative: 8 %
Lymphs Abs: 0.5 10*3/uL — ABNORMAL LOW (ref 0.7–4.0)
MCH: 29.2 pg (ref 26.0–34.0)
MCHC: 33.5 g/dL (ref 30.0–36.0)
MCV: 87 fL (ref 80.0–100.0)
Monocytes Absolute: 0.8 10*3/uL (ref 0.1–1.0)
Monocytes Relative: 14 %
Neutro Abs: 3.4 10*3/uL (ref 1.7–7.7)
Neutrophils Relative %: 55 %
Platelet Count: 157 10*3/uL (ref 150–400)
RBC: 2.16 MIL/uL — ABNORMAL LOW (ref 4.22–5.81)
RDW: 14.6 % (ref 11.5–15.5)
WBC Count: 6.1 10*3/uL (ref 4.0–10.5)
nRBC: 0 % (ref 0.0–0.2)

## 2023-06-02 LAB — CMP (CANCER CENTER ONLY)
ALT: 51 U/L — ABNORMAL HIGH (ref 0–44)
AST: 17 U/L (ref 15–41)
Albumin: 3.7 g/dL (ref 3.5–5.0)
Alkaline Phosphatase: 39 U/L (ref 38–126)
Anion gap: 5 (ref 5–15)
BUN: 23 mg/dL (ref 8–23)
CO2: 27 mmol/L (ref 22–32)
Calcium: 8 mg/dL — ABNORMAL LOW (ref 8.9–10.3)
Chloride: 104 mmol/L (ref 98–111)
Creatinine: 0.97 mg/dL (ref 0.61–1.24)
GFR, Estimated: >60 mL/min (ref >60)
Glucose, Bld: 224 mg/dL — ABNORMAL HIGH (ref 70–99)
Potassium: 4 mmol/L (ref 3.5–5.1)
Sodium: 136 mmol/L (ref 135–145)
Total Bilirubin: 0.7 mg/dL (ref 0.3–1.2)
Total Protein: 5.6 g/dL — ABNORMAL LOW (ref 6.5–8.1)

## 2023-06-02 LAB — SAMPLE TO BLOOD BANK

## 2023-06-02 LAB — PREPARE RBC (CROSSMATCH)

## 2023-06-02 MED ORDER — METHYLPREDNISOLONE SODIUM SUCC 40 MG IJ SOLR
40.0000 mg | Freq: Once | INTRAMUSCULAR | Status: AC
  Administered 2023-06-02: 40 mg via INTRAVENOUS
  Filled 2023-06-02: qty 1

## 2023-06-02 MED ORDER — SODIUM CHLORIDE 0.9% FLUSH
10.0000 mL | Freq: Once | INTRAVENOUS | Status: AC
  Administered 2023-06-02: 10 mL

## 2023-06-02 MED ORDER — ACETAMINOPHEN 325 MG PO TABS
650.0000 mg | ORAL_TABLET | Freq: Once | ORAL | Status: AC
  Administered 2023-06-02: 650 mg via ORAL
  Filled 2023-06-02: qty 2

## 2023-06-02 MED ORDER — SODIUM CHLORIDE 0.9% IV SOLUTION
250.0000 mL | Freq: Once | INTRAVENOUS | Status: AC
  Administered 2023-06-02: 250 mL via INTRAVENOUS

## 2023-06-02 NOTE — Patient Instructions (Signed)

## 2023-06-03 LAB — BPAM RBC
Blood Product Expiration Date: 202409042359
Blood Product Expiration Date: 202409042359
ISSUE DATE / TIME: 202408151028
ISSUE DATE / TIME: 202408151028
Unit Type and Rh: 6200
Unit Type and Rh: 6200

## 2023-06-03 LAB — TYPE AND SCREEN
ABO/RH(D): A POS
Antibody Screen: NEGATIVE
Unit division: 0
Unit division: 0

## 2023-06-08 ENCOUNTER — Other Ambulatory Visit: Payer: Self-pay

## 2023-06-08 DIAGNOSIS — D469 Myelodysplastic syndrome, unspecified: Secondary | ICD-10-CM

## 2023-06-09 ENCOUNTER — Inpatient Hospital Stay: Payer: Medicare Other

## 2023-06-09 ENCOUNTER — Other Ambulatory Visit: Payer: Self-pay

## 2023-06-09 ENCOUNTER — Inpatient Hospital Stay: Payer: Medicare Other | Admitting: Hematology

## 2023-06-09 DIAGNOSIS — R161 Splenomegaly, not elsewhere classified: Secondary | ICD-10-CM | POA: Diagnosis not present

## 2023-06-09 DIAGNOSIS — N202 Calculus of kidney with calculus of ureter: Secondary | ICD-10-CM | POA: Diagnosis not present

## 2023-06-09 DIAGNOSIS — Z1589 Genetic susceptibility to other disease: Secondary | ICD-10-CM | POA: Diagnosis not present

## 2023-06-09 DIAGNOSIS — R464 Slowness and poor responsiveness: Secondary | ICD-10-CM | POA: Diagnosis not present

## 2023-06-09 DIAGNOSIS — Z87891 Personal history of nicotine dependence: Secondary | ICD-10-CM | POA: Diagnosis not present

## 2023-06-09 DIAGNOSIS — R600 Localized edema: Secondary | ICD-10-CM | POA: Diagnosis not present

## 2023-06-09 DIAGNOSIS — K219 Gastro-esophageal reflux disease without esophagitis: Secondary | ICD-10-CM | POA: Diagnosis not present

## 2023-06-09 DIAGNOSIS — I7090 Unspecified atherosclerosis: Secondary | ICD-10-CM | POA: Diagnosis not present

## 2023-06-09 DIAGNOSIS — D75839 Thrombocytosis, unspecified: Secondary | ICD-10-CM | POA: Diagnosis not present

## 2023-06-09 DIAGNOSIS — Z7961 Long term (current) use of immunomodulator: Secondary | ICD-10-CM | POA: Diagnosis not present

## 2023-06-09 DIAGNOSIS — D469 Myelodysplastic syndrome, unspecified: Secondary | ICD-10-CM | POA: Diagnosis not present

## 2023-06-09 DIAGNOSIS — E785 Hyperlipidemia, unspecified: Secondary | ICD-10-CM | POA: Diagnosis not present

## 2023-06-09 DIAGNOSIS — Z79899 Other long term (current) drug therapy: Secondary | ICD-10-CM | POA: Diagnosis not present

## 2023-06-09 DIAGNOSIS — D471 Chronic myeloproliferative disease: Secondary | ICD-10-CM

## 2023-06-09 DIAGNOSIS — Z87442 Personal history of urinary calculi: Secondary | ICD-10-CM | POA: Diagnosis not present

## 2023-06-09 DIAGNOSIS — D72829 Elevated white blood cell count, unspecified: Secondary | ICD-10-CM | POA: Diagnosis not present

## 2023-06-09 DIAGNOSIS — D539 Nutritional anemia, unspecified: Secondary | ICD-10-CM | POA: Diagnosis not present

## 2023-06-09 DIAGNOSIS — K449 Diaphragmatic hernia without obstruction or gangrene: Secondary | ICD-10-CM | POA: Diagnosis not present

## 2023-06-09 DIAGNOSIS — J90 Pleural effusion, not elsewhere classified: Secondary | ICD-10-CM | POA: Diagnosis not present

## 2023-06-09 LAB — CBC WITH DIFFERENTIAL (CANCER CENTER ONLY)
Abs Immature Granulocytes: 0.49 10*3/uL — ABNORMAL HIGH (ref 0.00–0.07)
Basophils Absolute: 0.3 10*3/uL — ABNORMAL HIGH (ref 0.0–0.1)
Basophils Relative: 2 %
Eosinophils Absolute: 2.1 10*3/uL — ABNORMAL HIGH (ref 0.0–0.5)
Eosinophils Relative: 20 %
HCT: 19.8 % — ABNORMAL LOW (ref 39.0–52.0)
Hemoglobin: 6.6 g/dL — CL (ref 13.0–17.0)
Immature Granulocytes: 5 %
Lymphocytes Relative: 6 %
Lymphs Abs: 0.7 10*3/uL (ref 0.7–4.0)
MCH: 28.8 pg (ref 26.0–34.0)
MCHC: 33.3 g/dL (ref 30.0–36.0)
MCV: 86.5 fL (ref 80.0–100.0)
Monocytes Absolute: 0.9 10*3/uL (ref 0.1–1.0)
Monocytes Relative: 9 %
Neutro Abs: 6.1 10*3/uL (ref 1.7–7.7)
Neutrophils Relative %: 58 %
Platelet Count: 161 10*3/uL (ref 150–400)
RBC: 2.29 MIL/uL — ABNORMAL LOW (ref 4.22–5.81)
RDW: 14.6 % (ref 11.5–15.5)
Smear Review: NORMAL
WBC Count: 10.4 10*3/uL (ref 4.0–10.5)
nRBC: 0 % (ref 0.0–0.2)

## 2023-06-09 LAB — PREPARE RBC (CROSSMATCH)

## 2023-06-09 LAB — CMP (CANCER CENTER ONLY)
ALT: 46 U/L — ABNORMAL HIGH (ref 0–44)
AST: 17 U/L (ref 15–41)
Albumin: 3.4 g/dL — ABNORMAL LOW (ref 3.5–5.0)
Alkaline Phosphatase: 39 U/L (ref 38–126)
Anion gap: 7 (ref 5–15)
BUN: 19 mg/dL (ref 8–23)
CO2: 26 mmol/L (ref 22–32)
Calcium: 8.4 mg/dL — ABNORMAL LOW (ref 8.9–10.3)
Chloride: 104 mmol/L (ref 98–111)
Creatinine: 1 mg/dL (ref 0.61–1.24)
GFR, Estimated: >60 mL/min (ref >60)
Glucose, Bld: 115 mg/dL — ABNORMAL HIGH (ref 70–99)
Potassium: 4.1 mmol/L (ref 3.5–5.1)
Sodium: 137 mmol/L (ref 135–145)
Total Bilirubin: 0.9 mg/dL (ref 0.3–1.2)
Total Protein: 5.8 g/dL — ABNORMAL LOW (ref 6.5–8.1)

## 2023-06-09 LAB — SAMPLE TO BLOOD BANK

## 2023-06-09 MED ORDER — POTASSIUM CHLORIDE CRYS ER 20 MEQ PO TBCR
20.0000 meq | EXTENDED_RELEASE_TABLET | Freq: Two times a day (BID) | ORAL | 0 refills | Status: DC
Start: 2023-06-09 — End: 2023-09-08

## 2023-06-09 MED ORDER — SODIUM CHLORIDE 0.9% FLUSH: 3.0000 mL | INTRAVENOUS | Status: DC | PRN

## 2023-06-09 MED ORDER — HEPARIN SOD (PORK) LOCK FLUSH 100 UNIT/ML IV SOLN
250.0000 [IU] | INTRAVENOUS | Status: AC | PRN
  Administered 2023-06-09: 500 [IU]

## 2023-06-09 MED ORDER — METHYLPREDNISOLONE SODIUM SUCC 40 MG IJ SOLR
40.0000 mg | Freq: Once | INTRAMUSCULAR | Status: AC
  Administered 2023-06-09: 40 mg via INTRAVENOUS
  Filled 2023-06-09: qty 1

## 2023-06-09 MED ORDER — ACETAMINOPHEN 325 MG PO TABS
650.0000 mg | ORAL_TABLET | Freq: Once | ORAL | Status: AC
  Administered 2023-06-09: 650 mg via ORAL
  Filled 2023-06-09: qty 2

## 2023-06-09 MED ORDER — SODIUM CHLORIDE 0.9% FLUSH
10.0000 mL | INTRAVENOUS | Status: AC | PRN
  Administered 2023-06-09: 10 mL

## 2023-06-09 MED ORDER — SODIUM CHLORIDE 0.9% IV SOLUTION
250.0000 mL | Freq: Once | INTRAVENOUS | Status: AC
  Administered 2023-06-09: 250 mL via INTRAVENOUS

## 2023-06-09 MED ORDER — FUROSEMIDE 20 MG PO TABS
20.0000 mg | ORAL_TABLET | Freq: Every day | ORAL | 1 refills | Status: DC
Start: 2023-06-09 — End: 2023-08-01

## 2023-06-09 NOTE — Progress Notes (Signed)
HEMATOLOGY/ONCOLOGY PROGRESS NOTE:   Date of Service: 06/09/23  Patient Care Team: Jackelyn Poling, DO as PCP - General (Family Medicine)  CHIEF COMPLAINTS:  Follow-up for continued evaluation and management of JAK2 positive myeloproliferative neoplasm/MDS  INTERVAL HISTORY:  Mr. Fairley Gotay is a 77 y.o. male here for continued evaluation and management of his MPN/MDS with refractory anemia and thrombocytosis. Patient was last seen by me on 05/05/2023 and complained of increased fatigue. He also reported right knee and foot pain/swelling related to an infected cyst causing him to present to the ED. At the time of his clinical visit, he did endorse mild white drainage and mild LE edema.  He was most recently seen by [facility] on 05/12/2023 and complained of sharp right knee pain. A five-day course of 20 mg Prednisone was added to the 10 mg he was on previously for a total of 30 mg daily to treat the gout flare.  Today, he is accompanied by his wife. He complains of bilateral leg swelling but denies any pain in the area. His edema is worse in the mornings and improves at night. He regularly takes Aspirin. His wife reports that with use of compression socks, fluid pools above the end of the socks. Patient continues to wean down on Prednisone and currently takes a half tablet every other day.   He reports that after meals, he experiences left-sided abdominal discomfort followed by diarrhea. Patient denies any acute sharp pain in the spleen. He reports that he has lost some weight recently.  Patient denies any infection issues.   MEDICAL HISTORY:  Past Medical History:  Diagnosis Date   Arthritis of right knee    Cervicalgia    Chest discomfort    normal stress echo   Chest pain    Colon polyps    Dyslipidemia    Fluttering sensation of heart    GERD without esophagitis    Hematochezia    Hyperglycemia    Hyperlipidemia    Internal hemorrhoids    Kidney stones    Male  erectile dysfunction, unspecified    Mixed dyslipidemia    Mixed hyperlipidemia    Ocular migraine    PAC (premature atrial contraction)    Personal history of colonic polyps    Prediabetes    Residual hemorrhoidal skin tags    Spleen enlarged    nov 2023 hospitalized   Unspecified hemorrhoids     SURGICAL HISTORY: Past Surgical History:  Procedure Laterality Date   BONE MARROW BIOPSY     IR IMAGING GUIDED PORT INSERTION  09/14/2022   KIDNEY STONE SURGERY     retrieval    SOCIAL HISTORY: Social History   Socioeconomic History   Marital status: Married    Spouse name: Not on file   Number of children: Not on file   Years of education: Not on file   Highest education level: Not on file  Occupational History   Not on file  Tobacco Use   Smoking status: Former    Types: Cigarettes   Smokeless tobacco: Never  Substance and Sexual Activity   Alcohol use: Yes    Alcohol/week: 1.0 standard drink of alcohol    Types: 1 Cans of beer per week   Drug use: Not on file   Sexual activity: Not on file  Other Topics Concern   Not on file  Social History Narrative   Not on file   Social Determinants of Health   Financial Resource Strain: Not  on file  Food Insecurity: Unknown (08/31/2022)   Hunger Vital Sign    Worried About Running Out of Food in the Last Year: Never true    Ran Out of Food in the Last Year: Not on file  Transportation Needs: No Transportation Needs (08/31/2022)   PRAPARE - Administrator, Civil Service (Medical): No    Lack of Transportation (Non-Medical): No  Physical Activity: Not on file  Stress: Not on file  Social Connections: Not on file  Intimate Partner Violence: Not on file    FAMILY HISTORY: Family History  Problem Relation Age of Onset   Stroke Brother    Congestive Heart Failure Brother     ALLERGIES:  has No Known Allergies.  MEDICATIONS:  Current Outpatient Medications  Medication Sig Dispense Refill   ALFUZOSIN HCL  ER PO Take 1 tablet by mouth daily after supper.     aspirin EC 81 MG tablet Take 81 mg by mouth daily. Swallow whole.     B Complex Vitamins (B COMPLEX PO) Take 1 tablet by mouth daily.     Cholecalciferol (VITAMIN D3) 50 MCG (2000 UT) TABS Take 2,000 Units by mouth daily.     colchicine 0.6 MG tablet Take 2 tablets (1.2 mg), followed by 1 tablet (0.6 mg) 1 hour later. Wait 3 days before starting next round. 6 tablet 0   docusate sodium (COLACE) 100 MG capsule Take 100 mg by mouth 2 (two) times daily.     doxycycline (VIBRAMYCIN) 100 MG capsule Take 1 capsule (100 mg total) by mouth 2 (two) times daily. 20 capsule 0   FIBER PO Take 1 capsule by mouth daily. Unknown strength     fish oil-omega-3 fatty acids 1000 MG capsule Take 1 g by mouth 2 (two) times daily. Strength 300mg /1500mg      Flaxseed, Linseed, (FLAXSEED OIL) 1000 MG CAPS Take 1,000 mg by mouth daily.     GLUCOSAMINE CHONDROITIN MSM PO Take 1 tablet by mouth daily. Strength 1500/1500     lidocaine-prilocaine (EMLA) cream Apply to affected area once (Patient taking differently: Apply 1 Application topically once. Apply to affected area once) 30 g 3   Misc Natural Products (PROSTATE SUPPORT PO) Take 1 capsule by mouth daily. Unknown strength     Multiple Vitamin (MULTIVITAMIN) tablet Take 1 tablet by mouth daily. Unknown strength     ondansetron (ZOFRAN) 8 MG tablet Take 1 tablet (8 mg total) by mouth every 8 (eight) hours as needed for nausea or vomiting. 30 tablet 1   prochlorperazine (COMPAZINE) 10 MG tablet Take 1 tablet (10 mg total) by mouth every 6 (six) hours as needed for nausea or vomiting. 30 tablet 1   psyllium (METAMUCIL) 58.6 % powder Take 1 packet by mouth daily.     REVLIMID 10 MG capsule Take 1 capsule (10 mg total) by mouth daily. Take for 21 days on, then 7 days off. Repeat every 28 days. 21 capsule 0   senna-docusate (SENNA S) 8.6-50 MG tablet Take 2 tablets by mouth at bedtime. 60 tablet 1   traMADol (ULTRAM) 50 MG  tablet Take 50 mg by mouth as needed for moderate pain or severe pain.     Turmeric (QC TUMERIC COMPLEX PO) Take 750 mg by mouth daily.     vitamin C (ASCORBIC ACID) 250 MG tablet Take 500 mg by mouth daily.     No current facility-administered medications for this visit.    REVIEW OF SYSTEMS:    10  Point review of Systems was done is negative except as noted above.   PHYSICAL EXAMINATION: VSS GENERAL:alert, in no acute distress and comfortable SKIN: no acute rashes, no significant lesions EYES: conjunctiva are pink and non-injected, sclera anicteric OROPHARYNX: MMM, no exudates, no oropharyngeal erythema or ulceration NECK: supple, no JVD LYMPH:  no palpable lymphadenopathy in the cervical, axillary or inguinal regions LUNGS: clear to auscultation b/l with normal respiratory effort HEART: regular rate & rhythm ABDOMEN:  normoactive bowel sounds , non tender, not distended. Extremity: no pedal edema PSYCH: alert & oriented x 3 with fluent speech NEURO: no focal motor/sensory deficits   LABORATORY DATA:  I have reviewed the data as listed .    Latest Ref Rng & Units 06/09/2023    8:45 AM 06/02/2023    8:28 AM 05/26/2023    8:28 AM  CBC  WBC 4.0 - 10.5 K/uL 10.4  6.1  8.9   Hemoglobin 13.0 - 17.0 g/dL 6.6  6.3  6.6   Hematocrit 39.0 - 52.0 % 19.8  18.8  19.5   Platelets 150 - 400 K/uL 161  157  151      Iron/TIBC/Ferritin/ %Sat    Component Value Date/Time   IRON 180 09/30/2022 0924   TIBC 193 (L) 09/30/2022 0924   FERRITIN 1,450 (H) 09/30/2022 0924   IRONPCTSAT 93 (H) 09/30/2022 0924      Latest Ref Rng & Units 06/09/2023    8:45 AM 06/02/2023    8:28 AM 05/26/2023    8:28 AM  CMP  Glucose 70 - 99 mg/dL 469  629  528   BUN 8 - 23 mg/dL 19  23  19    Creatinine 0.61 - 1.24 mg/dL 4.13  2.44  0.10   Sodium 135 - 145 mmol/L 137  136  137   Potassium 3.5 - 5.1 mmol/L 4.1  4.0  4.0   Chloride 98 - 111 mmol/L 104  104  105   CO2 22 - 32 mmol/L 26  27  25    Calcium 8.9 -  10.3 mg/dL 8.4  8.0  8.4   Total Protein 6.5 - 8.1 g/dL 5.8  5.6  5.7   Total Bilirubin 0.3 - 1.2 mg/dL 0.9  0.7  0.9   Alkaline Phos 38 - 126 U/L 39  39  39   AST 15 - 41 U/L 17  17  16    ALT 0 - 44 U/L 46  51  53     Lab Results  Component Value Date   LDH 110 01/04/2022    02/23/2021 BCR ABL    02/23/2021 JAK2   Surgical Pathology  CASE: [id number]  PATIENT: Abie Bain  Bone Marrow Report  Clinical History: MPN with progressive anemia  (BH)  DIAGNOSIS:   BONE MARROW, ASPIRATE, CLOT, CORE:  -Hypercellular bone marrow with features of myeloid neoplasm  -See comment   PERIPHERAL BLOOD:  -Macrocytic anemia  -Leukocytosis  -Thrombocytosis   COMMENT:   The bone marrow/peripheral blood show persistent involvement by  previously known myeloid neoplasm.  There is a myeloproliferative  component as supported by previous JAK2 positivity.  However, there are  also dyspoietic changes primarily involving the megakaryocytic cell line  with numerous hypolobated/unilobated forms in addition to  dysgranulopoiesis to a lesser extent.  This is associated with  eosinophilia.  It is not entirely clear whether the overall findings  represent a myeloproliferative neoplasm with treatment related changes  or represent a primary myeloproliferative/myelodysplastic neoplasm  including but  not limited to myeloid neoplasms with eosinophilia.  Correlation with cytogenetic and FISH studies strongly recommended.      RADIOGRAPHIC STUDIES: I have personally reviewed the radiological images as listed and agreed with the findings in the report. DG Knee AP/LAT W/Sunrise Right  Result Date: 05/30/2023 CLINICAL DATA:  Anterior knee pain and swelling for 1 month. No known injury. EXAM: RIGHT KNEE 3 VIEWS COMPARISON:  Radiographs 04/23/2023 FINDINGS: The mineralization and alignment are normal. There is no evidence of acute fracture or dislocation. Stable mild medial compartment joint space  narrowing and small patellar osteophytes. No significant joint effusion. Prepatellar soft tissue swelling again noted, possibly slightly increased from the prior study. IMPRESSION: No acute osseous findings. Prepatellar soft tissue swelling, possibly slightly increased from prior study. Consider prepatellar bursitis. Electronically Signed   By: Carey Bullocks M.D.   On: 05/30/2023 17:12     IR IMAGING GUIDED PORT INSERTION  Result Date: 09/14/2022 INDICATION: Poor IV access.  Myelodysplastic syndrome EXAM: IMPLANTED PORT A CATH PLACEMENT WITH ULTRASOUND AND FLUOROSCOPIC GUIDANCE MEDICATIONS: None ANESTHESIA/SEDATION: Moderate (conscious) sedation was employed during this procedure. A total of Versed 3 mg and Fentanyl 100 mcg was administered intravenously. Moderate Sedation Time: 20 minutes. The patient's level of consciousness and vital signs were monitored continuously by radiology nursing throughout the procedure under my direct supervision. FLUOROSCOPY TIME:  Fluoroscopic dose; 1 mGy COMPLICATIONS: None immediate. PROCEDURE: The procedure, risks, benefits, and alternatives were explained to the patient. Questions regarding the procedure were encouraged and answered. The patient understands and consents to the procedure. The RIGHT neck and chest were prepped with chlorhexidine in a sterile fashion, and a sterile drape was applied covering the operative field. Maximum barrier sterile technique with sterile gowns and gloves were used for the procedure. A timeout was performed prior to the initiation of the procedure. Local anesthesia was provided with 1% lidocaine with epinephrine. After creating a small venotomy incision, a micropuncture kit was utilized to access the internal jugular vein under direct, real-time ultrasound guidance. Ultrasound image documentation was performed. The microwire was kinked to measure appropriate catheter length. A subcutaneous port pocket was then created along the upper chest  wall utilizing a combination of sharp and blunt dissection. The pocket was irrigated with sterile saline. A single lumen ISP power injectable port was chosen for placement. The 8 Fr catheter was tunneled from the port pocket site to the venotomy incision. The port was placed in the pocket. The external catheter was trimmed to appropriate length. At the venotomy, an 8 Fr peel-away sheath was placed over a guidewire under fluoroscopic guidance. The catheter was then placed through the sheath and the sheath was removed. Final catheter positioning was confirmed and documented with a fluoroscopic spot radiograph. The port was accessed with a Huber needle, aspirated and flushed with heparinized saline. The port pocket incision was closed with interrupted 3-0 Vicryl suture then Dermabond was applied, including at the venotomy incision. Dressings were placed. The patient tolerated the procedure well without immediate post procedural complication. IMPRESSION: Successful placement of a RIGHT internal jugular approach power injectable Port-A-Cath. The tip of the catheter is positioned within the proximal RIGHT atrium. The catheter is ready for immediate use. Roanna Banning, MD [facility] Electronically Signed   By: Roanna Banning M.D.   On: 09/14/2022 17:18   CT ANGIO GI BLEED  Result Date: 08/31/2022 CLINICAL DATA:  Acute mesenteric ischemia. Abdominal pain. History of myelodysplastic syndrome. EXAM: CTA ABDOMEN AND  PELVIS WITHOUT AND WITH CONTRAST TECHNIQUE: Multidetector CT imaging of the abdomen and pelvis was performed using the standard protocol during bolus administration of intravenous contrast. Multiplanar reconstructed images and MIPs were obtained and reviewed to evaluate the vascular anatomy. RADIATION DOSE REDUCTION: This exam was performed according to the departmental dose-optimization program which includes automated exposure control, adjustment of the  mA and/or kV according to patient size and/or use of iterative reconstruction technique. CONTRAST:  OMNIPAQUE IOHEXOL 350 MG/ML SOLN COMPARISON:  Abdominal ultrasound examination 04/03/2021 FINDINGS: VASCULAR Aorta: Normal caliber abdominal aorta. Scattered atherosclerotic calcifications. No dissection. Celiac: Normal SMA: Normal Renals: Normal IMA: Patent Inflow: Normal Proximal Outflow: Normal Veins: Normal Review of the MIP images confirms the above findings. NON-VASCULAR Lower chest: Small left pleural effusion and bibasilar atelectasis. The heart is normal in size. No pericardial effusion. There is a moderate to large hiatal hernia. Hepatobiliary: No hepatic lesions or intrahepatic biliary dilatation. The gallbladder is unremarkable. No common bile duct dilatation. Pancreas: No mass, inflammation or ductal dilatation. Spleen: Massive splenomegaly. The spleen measures 20 x 16 x 11 cm. No splenic lesions or splenic infarct. Adrenals/Urinary Tract: The left kidney is displaced medially and inferiorly by the enlarged spleen. No worrisome renal lesions,, hydronephrosis or pyelonephritis. There is a lower pole right renal calculus and there are 2 distal left ureteral calculi more proximal calculus measures 4 mm and the more distal calculus measures 5 mm. I do not see any hydroureter or hydronephrosis. Stomach/Bowel: The stomach, duodenum, small bowel and colon are grossly normal. No inflammatory changes, mass lesions or obstructive findings. Lymphatic: No abdominal or pelvic lymphadenopathy. Reproductive: The prostate gland is enlarged. The seminal vesicles are unremarkable. Other: No pelvic mass or adenopathy. No free pelvic fluid collections. No inguinal mass or adenopathy. No abdominal wall hernia or subcutaneous lesions. Musculoskeletal: No significant bony findings. IMPRESSION: 1. Normal caliber abdominal aorta and no dissection. The branch vessels are normal. 2. Splenomegaly. 3. Two distal left ureteral  calculi but no hydroureter or hydronephrosis. 4. Lower pole right renal calculus. 5. Moderate to large hiatal hernia. 6. Small left pleural effusion and bibasilar atelectasis. Electronically Signed   By: Rudie Meyer M.D.   On: 08/31/2022 18:13     ASSESSMENT & PLAN:    #1 JAK2-positive myeloproliferative neoplasm-primarily presenting with thrombocytosis and associated MDS causing refractory anemia -No polycythemia.  -Mild leukocytosis.  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 Has grade 1 out of 3 reticulin fibrosis.  Uncertain if this is primary or secondary. -LDH has remained within normal limits. 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 -CT Angio GI bleed scan from 09/01/2022 showed kidney stones.and significant splenomegaly  PLAN:  -Discussed lab results on 06/09/2023 in detail with patient. CBC showed WBC of 10.4K, hemoglobin of 6.6, and platelets of 161K. -Proliferative bone marrow element is stable at this time -Patient's PRBC transfusion needs are increasing. He has received 5 units of PRBCs this month -discussed goal to limit blood transfusion requirements  -treatment may be causing more bone marrow suppression rather than helping anemia -discussed concern that the bone marrow is not producing RBCs and is not life sustaining -if transfusion needs are not improving, may need to consider other options. -discussed potential proceeding options such as continuing Revlimid, switching to a JAK2 inhibitor such as Pacritinib to improve signaling, or only supporting care -not unreasonable to stop Revlimid and switch to JAK2 inhibitor, Pacritinib  -discussed option for patient to connect with Dr. Lowella Bandy to discuss further options -  discussed option of receiving an additional opinion at [facility] or considering clinical trials -okay to taper off of steroids at this time -continue 10 MG Revlimid for the next few weeks -Continue blood transfusion support  as needed  -continue Aspirin to reduce risk of blood clots  -bilateral lower extremity is likely fluid retention -would recommend to wear 20-30 mmHg compression socks first thing in morning prior to getting out of bed to improve bilateral LE edema -if his leg swelling is bothersome, there may be a role to consider 20 MG Lasix along with potassium tablets -diarrhea may be related to Revlimid -answered all of patient's and his wife's questions in detail   Follow-up: Labs and PRBC transfusion weekly x 8 MD visit in 3 weeks  The total time spent in the appointment was 30 minutes* .  All of the patient's questions were answered with apparent satisfaction. The patient knows to call the clinic with any problems, questions or concerns.   Wyvonnia Lora MD MS AAHIVMS [facility] Hematology/Oncology Physician [facility]  .*Total Encounter Time as defined by the Centers for Medicare and Medicaid Services includes, in addition to the face-to-face time of a patient visit (documented in the note above) non-face-to-face time: obtaining and reviewing outside history, ordering and reviewing medications, tests or procedures, care coordination (communications with other health care professionals or caregivers) and documentation in the medical record.    I,Mitra Faeizi,acting as a Neurosurgeon for Wyvonnia Lora, MD.,have documented all relevant documentation on the behalf of Wyvonnia Lora, MD,as directed by  Wyvonnia Lora, MD while in the presence of Wyvonnia Lora, MD.  .I have reviewed the above documentation for accuracy and completeness, and I agree with the above. Johney Maine MD

## 2023-06-09 NOTE — Patient Instructions (Signed)

## 2023-06-10 LAB — TYPE AND SCREEN
ABO/RH(D): A POS
Antibody Screen: NEGATIVE
Unit division: 0
Unit division: 0

## 2023-06-10 LAB — BPAM RBC
Blood Product Expiration Date: 202409142359
Blood Product Expiration Date: 202409162359
ISSUE DATE / TIME: 202408221004
ISSUE DATE / TIME: 202408221004
Unit Type and Rh: 6200
Unit Type and Rh: 6200

## 2023-06-14 ENCOUNTER — Other Ambulatory Visit: Payer: Self-pay

## 2023-06-15 ENCOUNTER — Encounter: Payer: Self-pay | Admitting: Hematology

## 2023-06-15 ENCOUNTER — Other Ambulatory Visit: Payer: Self-pay

## 2023-06-15 DIAGNOSIS — D469 Myelodysplastic syndrome, unspecified: Secondary | ICD-10-CM

## 2023-06-16 ENCOUNTER — Other Ambulatory Visit: Payer: Self-pay

## 2023-06-16 ENCOUNTER — Inpatient Hospital Stay: Payer: Medicare Other

## 2023-06-16 DIAGNOSIS — Z87442 Personal history of urinary calculi: Secondary | ICD-10-CM | POA: Diagnosis not present

## 2023-06-16 DIAGNOSIS — Z79899 Other long term (current) drug therapy: Secondary | ICD-10-CM | POA: Diagnosis not present

## 2023-06-16 DIAGNOSIS — R600 Localized edema: Secondary | ICD-10-CM | POA: Diagnosis not present

## 2023-06-16 DIAGNOSIS — R161 Splenomegaly, not elsewhere classified: Secondary | ICD-10-CM | POA: Diagnosis not present

## 2023-06-16 DIAGNOSIS — N202 Calculus of kidney with calculus of ureter: Secondary | ICD-10-CM | POA: Diagnosis not present

## 2023-06-16 DIAGNOSIS — D469 Myelodysplastic syndrome, unspecified: Secondary | ICD-10-CM

## 2023-06-16 DIAGNOSIS — J90 Pleural effusion, not elsewhere classified: Secondary | ICD-10-CM | POA: Diagnosis not present

## 2023-06-16 DIAGNOSIS — K219 Gastro-esophageal reflux disease without esophagitis: Secondary | ICD-10-CM | POA: Diagnosis not present

## 2023-06-16 DIAGNOSIS — D75839 Thrombocytosis, unspecified: Secondary | ICD-10-CM | POA: Diagnosis not present

## 2023-06-16 DIAGNOSIS — E785 Hyperlipidemia, unspecified: Secondary | ICD-10-CM | POA: Diagnosis not present

## 2023-06-16 DIAGNOSIS — Z87891 Personal history of nicotine dependence: Secondary | ICD-10-CM | POA: Diagnosis not present

## 2023-06-16 DIAGNOSIS — K449 Diaphragmatic hernia without obstruction or gangrene: Secondary | ICD-10-CM | POA: Diagnosis not present

## 2023-06-16 DIAGNOSIS — Z7961 Long term (current) use of immunomodulator: Secondary | ICD-10-CM | POA: Diagnosis not present

## 2023-06-16 DIAGNOSIS — I7090 Unspecified atherosclerosis: Secondary | ICD-10-CM | POA: Diagnosis not present

## 2023-06-16 DIAGNOSIS — D72829 Elevated white blood cell count, unspecified: Secondary | ICD-10-CM | POA: Diagnosis not present

## 2023-06-16 DIAGNOSIS — D539 Nutritional anemia, unspecified: Secondary | ICD-10-CM | POA: Diagnosis not present

## 2023-06-16 DIAGNOSIS — R464 Slowness and poor responsiveness: Secondary | ICD-10-CM | POA: Diagnosis not present

## 2023-06-16 LAB — CMP (CANCER CENTER ONLY)
ALT: 58 U/L — ABNORMAL HIGH (ref 0–44)
AST: 20 U/L (ref 15–41)
Albumin: 3.8 g/dL (ref 3.5–5.0)
Alkaline Phosphatase: 39 U/L (ref 38–126)
Anion gap: 4 — ABNORMAL LOW (ref 5–15)
BUN: 20 mg/dL (ref 8–23)
CO2: 28 mmol/L (ref 22–32)
Calcium: 8.5 mg/dL — ABNORMAL LOW (ref 8.9–10.3)
Chloride: 106 mmol/L (ref 98–111)
Creatinine: 0.96 mg/dL (ref 0.61–1.24)
GFR, Estimated: >60 mL/min (ref >60)
Glucose, Bld: 163 mg/dL — ABNORMAL HIGH (ref 70–99)
Potassium: 4.1 mmol/L (ref 3.5–5.1)
Sodium: 138 mmol/L (ref 135–145)
Total Bilirubin: 0.6 mg/dL (ref 0.3–1.2)
Total Protein: 5.8 g/dL — ABNORMAL LOW (ref 6.5–8.1)

## 2023-06-16 LAB — CBC WITH DIFFERENTIAL (CANCER CENTER ONLY)
Abs Immature Granulocytes: 0.37 10*3/uL — ABNORMAL HIGH (ref 0.00–0.07)
Basophils Absolute: 0.2 10*3/uL — ABNORMAL HIGH (ref 0.0–0.1)
Basophils Relative: 3 %
Eosinophils Absolute: 1.6 10*3/uL — ABNORMAL HIGH (ref 0.0–0.5)
Eosinophils Relative: 24 %
HCT: 21 % — ABNORMAL LOW (ref 39.0–52.0)
Hemoglobin: 7 g/dL — ABNORMAL LOW (ref 13.0–17.0)
Immature Granulocytes: 6 %
Lymphocytes Relative: 8 %
Lymphs Abs: 0.5 10*3/uL — ABNORMAL LOW (ref 0.7–4.0)
MCH: 28.9 pg (ref 26.0–34.0)
MCHC: 33.3 g/dL (ref 30.0–36.0)
MCV: 86.8 fL (ref 80.0–100.0)
Monocytes Absolute: 0.8 10*3/uL (ref 0.1–1.0)
Monocytes Relative: 12 %
Neutro Abs: 3.2 10*3/uL (ref 1.7–7.7)
Neutrophils Relative %: 47 %
Platelet Count: 133 10*3/uL — ABNORMAL LOW (ref 150–400)
RBC: 2.42 MIL/uL — ABNORMAL LOW (ref 4.22–5.81)
RDW: 14.3 % (ref 11.5–15.5)
WBC Count: 6.6 10*3/uL (ref 4.0–10.5)
nRBC: 0 % (ref 0.0–0.2)

## 2023-06-16 LAB — SAMPLE TO BLOOD BANK

## 2023-06-16 LAB — PREPARE RBC (CROSSMATCH)

## 2023-06-16 MED ORDER — SODIUM CHLORIDE 0.9% IV SOLUTION
250.0000 mL | Freq: Once | INTRAVENOUS | Status: AC
  Administered 2023-06-16: 250 mL via INTRAVENOUS

## 2023-06-16 MED ORDER — ACETAMINOPHEN 325 MG PO TABS
650.0000 mg | ORAL_TABLET | Freq: Once | ORAL | Status: AC
  Administered 2023-06-16: 650 mg via ORAL
  Filled 2023-06-16: qty 2

## 2023-06-16 MED ORDER — HEPARIN SOD (PORK) LOCK FLUSH 100 UNIT/ML IV SOLN
250.0000 [IU] | INTRAVENOUS | Status: AC | PRN
  Administered 2023-06-16: 250 [IU]

## 2023-06-16 MED ORDER — SODIUM CHLORIDE 0.9% FLUSH
10.0000 mL | INTRAVENOUS | Status: AC | PRN
  Administered 2023-06-16: 10 mL

## 2023-06-16 MED ORDER — SODIUM CHLORIDE 0.9% FLUSH
10.0000 mL | Freq: Once | INTRAVENOUS | Status: AC
  Administered 2023-06-16: 10 mL

## 2023-06-16 MED ORDER — METHYLPREDNISOLONE SODIUM SUCC 40 MG IJ SOLR
40.0000 mg | Freq: Once | INTRAMUSCULAR | Status: AC
  Administered 2023-06-16: 40 mg via INTRAVENOUS
  Filled 2023-06-16: qty 1

## 2023-06-16 NOTE — Patient Instructions (Signed)

## 2023-06-17 LAB — BPAM RBC
Blood Product Expiration Date: 202409202359
ISSUE DATE / TIME: 202408291044
Unit Type and Rh: 6200

## 2023-06-17 LAB — TYPE AND SCREEN
ABO/RH(D): A POS
Antibody Screen: NEGATIVE
Unit division: 0

## 2023-06-22 ENCOUNTER — Telehealth: Payer: Self-pay | Admitting: Hematology

## 2023-06-22 ENCOUNTER — Other Ambulatory Visit: Payer: Self-pay

## 2023-06-22 DIAGNOSIS — D469 Myelodysplastic syndrome, unspecified: Secondary | ICD-10-CM

## 2023-06-22 NOTE — Telephone Encounter (Signed)
 Patient aware of scheduled appointment times/dates

## 2023-06-23 ENCOUNTER — Inpatient Hospital Stay: Payer: Medicare Other

## 2023-06-23 ENCOUNTER — Other Ambulatory Visit: Payer: Self-pay

## 2023-06-23 ENCOUNTER — Inpatient Hospital Stay: Payer: Medicare Other | Attending: Hematology

## 2023-06-23 DIAGNOSIS — D469 Myelodysplastic syndrome, unspecified: Secondary | ICD-10-CM

## 2023-06-23 DIAGNOSIS — D464 Refractory anemia, unspecified: Secondary | ICD-10-CM | POA: Diagnosis present

## 2023-06-23 DIAGNOSIS — R464 Slowness and poor responsiveness: Secondary | ICD-10-CM | POA: Insufficient documentation

## 2023-06-23 DIAGNOSIS — Z79899 Other long term (current) drug therapy: Secondary | ICD-10-CM | POA: Diagnosis not present

## 2023-06-23 LAB — CBC WITH DIFFERENTIAL (CANCER CENTER ONLY)
Abs Immature Granulocytes: 0.27 10*3/uL — ABNORMAL HIGH (ref 0.00–0.07)
Basophils Absolute: 0.1 10*3/uL (ref 0.0–0.1)
Basophils Relative: 2 %
Eosinophils Absolute: 2.1 10*3/uL — ABNORMAL HIGH (ref 0.0–0.5)
Eosinophils Relative: 31 %
HCT: 18.4 % — ABNORMAL LOW (ref 39.0–52.0)
Hemoglobin: 6.2 g/dL — CL (ref 13.0–17.0)
Immature Granulocytes: 4 %
Lymphocytes Relative: 8 %
Lymphs Abs: 0.5 10*3/uL — ABNORMAL LOW (ref 0.7–4.0)
MCH: 29.2 pg (ref 26.0–34.0)
MCHC: 33.7 g/dL (ref 30.0–36.0)
MCV: 86.8 fL (ref 80.0–100.0)
Monocytes Absolute: 0.6 10*3/uL (ref 0.1–1.0)
Monocytes Relative: 9 %
Neutro Abs: 3.1 10*3/uL (ref 1.7–7.7)
Neutrophils Relative %: 46 %
Platelet Count: 76 10*3/uL — ABNORMAL LOW (ref 150–400)
RBC: 2.12 MIL/uL — ABNORMAL LOW (ref 4.22–5.81)
RDW: 14.1 % (ref 11.5–15.5)
WBC Count: 6.8 10*3/uL (ref 4.0–10.5)
nRBC: 0 % (ref 0.0–0.2)

## 2023-06-23 LAB — CMP (CANCER CENTER ONLY)
ALT: 63 U/L — ABNORMAL HIGH (ref 0–44)
AST: 23 U/L (ref 15–41)
Albumin: 3.8 g/dL (ref 3.5–5.0)
Alkaline Phosphatase: 42 U/L (ref 38–126)
Anion gap: 6 (ref 5–15)
BUN: 20 mg/dL (ref 8–23)
CO2: 27 mmol/L (ref 22–32)
Calcium: 8.6 mg/dL — ABNORMAL LOW (ref 8.9–10.3)
Chloride: 104 mmol/L (ref 98–111)
Creatinine: 0.97 mg/dL (ref 0.61–1.24)
GFR, Estimated: >60 mL/min (ref >60)
Glucose, Bld: 168 mg/dL — ABNORMAL HIGH (ref 70–99)
Potassium: 4.2 mmol/L (ref 3.5–5.1)
Sodium: 137 mmol/L (ref 135–145)
Total Bilirubin: 0.9 mg/dL (ref 0.3–1.2)
Total Protein: 5.7 g/dL — ABNORMAL LOW (ref 6.5–8.1)

## 2023-06-23 LAB — PREPARE RBC (CROSSMATCH)

## 2023-06-23 LAB — SAMPLE TO BLOOD BANK

## 2023-06-23 MED ORDER — SODIUM CHLORIDE 0.9% IV SOLUTION
250.0000 mL | Freq: Once | INTRAVENOUS | Status: AC
  Administered 2023-06-23: 250 mL via INTRAVENOUS

## 2023-06-23 MED ORDER — SODIUM CHLORIDE 0.9% FLUSH
10.0000 mL | INTRAVENOUS | Status: AC | PRN
  Administered 2023-06-23: 10 mL

## 2023-06-23 MED ORDER — ACETAMINOPHEN 325 MG PO TABS
650.0000 mg | ORAL_TABLET | Freq: Once | ORAL | Status: AC
  Administered 2023-06-23: 650 mg via ORAL
  Filled 2023-06-23: qty 2

## 2023-06-23 MED ORDER — METHYLPREDNISOLONE SODIUM SUCC 40 MG IJ SOLR
40.0000 mg | Freq: Once | INTRAMUSCULAR | Status: AC
  Administered 2023-06-23: 40 mg via INTRAVENOUS
  Filled 2023-06-23: qty 1

## 2023-06-23 MED ORDER — HEPARIN SOD (PORK) LOCK FLUSH 100 UNIT/ML IV SOLN
500.0000 [IU] | Freq: Every day | INTRAVENOUS | Status: AC | PRN
  Administered 2023-06-23: 500 [IU]

## 2023-06-23 NOTE — Progress Notes (Signed)
CRITICAL VALUE STICKER  CRITICAL VALUE: hgb 6.2  RECEIVER (on-site recipient of call): Idelle Jo, RN  DATE & TIME NOTIFIED: 06/23/2023 0846  MESSENGER (representative from lab): Murvin Donning  MD aware, orders placed

## 2023-06-23 NOTE — Patient Instructions (Signed)

## 2023-06-24 LAB — BPAM RBC
Blood Product Expiration Date: 202409262359
Blood Product Expiration Date: 202409262359
ISSUE DATE / TIME: 202409051010
ISSUE DATE / TIME: 202409051010
Unit Type and Rh: 6200
Unit Type and Rh: 6200

## 2023-06-24 LAB — TYPE AND SCREEN
ABO/RH(D): A POS
Antibody Screen: NEGATIVE
Unit division: 0
Unit division: 0

## 2023-06-29 ENCOUNTER — Other Ambulatory Visit: Payer: Self-pay

## 2023-06-29 DIAGNOSIS — D469 Myelodysplastic syndrome, unspecified: Secondary | ICD-10-CM

## 2023-06-30 ENCOUNTER — Other Ambulatory Visit: Payer: Self-pay

## 2023-06-30 ENCOUNTER — Inpatient Hospital Stay: Payer: Medicare Other

## 2023-06-30 ENCOUNTER — Telehealth: Payer: Self-pay

## 2023-06-30 DIAGNOSIS — R464 Slowness and poor responsiveness: Secondary | ICD-10-CM | POA: Diagnosis not present

## 2023-06-30 DIAGNOSIS — D469 Myelodysplastic syndrome, unspecified: Secondary | ICD-10-CM

## 2023-06-30 DIAGNOSIS — Z79899 Other long term (current) drug therapy: Secondary | ICD-10-CM | POA: Diagnosis not present

## 2023-06-30 LAB — CBC WITH DIFFERENTIAL (CANCER CENTER ONLY)
Abs Immature Granulocytes: 0.29 10*3/uL — ABNORMAL HIGH (ref 0.00–0.07)
Basophils Absolute: 0.1 10*3/uL (ref 0.0–0.1)
Basophils Relative: 2 %
Eosinophils Absolute: 0.9 10*3/uL — ABNORMAL HIGH (ref 0.0–0.5)
Eosinophils Relative: 15 %
HCT: 19.3 % — ABNORMAL LOW (ref 39.0–52.0)
Hemoglobin: 6.4 g/dL — CL (ref 13.0–17.0)
Immature Granulocytes: 5 %
Lymphocytes Relative: 9 %
Lymphs Abs: 0.5 10*3/uL — ABNORMAL LOW (ref 0.7–4.0)
MCH: 28.2 pg (ref 26.0–34.0)
MCHC: 33.2 g/dL (ref 30.0–36.0)
MCV: 85 fL (ref 80.0–100.0)
Monocytes Absolute: 0.8 10*3/uL (ref 0.1–1.0)
Monocytes Relative: 13 %
Neutro Abs: 3.2 10*3/uL (ref 1.7–7.7)
Neutrophils Relative %: 56 %
Platelet Count: 102 10*3/uL — ABNORMAL LOW (ref 150–400)
RBC: 2.27 MIL/uL — ABNORMAL LOW (ref 4.22–5.81)
RDW: 15.2 % (ref 11.5–15.5)
WBC Count: 5.8 10*3/uL (ref 4.0–10.5)
nRBC: 0 % (ref 0.0–0.2)

## 2023-06-30 LAB — PREPARE RBC (CROSSMATCH)

## 2023-06-30 MED ORDER — SODIUM CHLORIDE 0.9% FLUSH
10.0000 mL | Freq: Once | INTRAVENOUS | Status: AC
  Administered 2023-06-30: 10 mL

## 2023-06-30 MED ORDER — SODIUM CHLORIDE 0.9% FLUSH
10.0000 mL | INTRAVENOUS | Status: AC | PRN
  Administered 2023-06-30: 10 mL

## 2023-06-30 MED ORDER — HEPARIN SOD (PORK) LOCK FLUSH 100 UNIT/ML IV SOLN
500.0000 [IU] | Freq: Every day | INTRAVENOUS | Status: AC | PRN
  Administered 2023-06-30: 500 [IU]

## 2023-06-30 MED ORDER — METHYLPREDNISOLONE SODIUM SUCC 40 MG IJ SOLR
40.0000 mg | Freq: Once | INTRAMUSCULAR | Status: AC
  Administered 2023-06-30: 40 mg via INTRAVENOUS
  Filled 2023-06-30: qty 1

## 2023-06-30 MED ORDER — ACETAMINOPHEN 325 MG PO TABS
650.0000 mg | ORAL_TABLET | Freq: Once | ORAL | Status: AC
  Administered 2023-06-30: 650 mg via ORAL
  Filled 2023-06-30: qty 2

## 2023-06-30 MED ORDER — SODIUM CHLORIDE 0.9% IV SOLUTION
250.0000 mL | Freq: Once | INTRAVENOUS | Status: AC
  Administered 2023-06-30: 250 mL via INTRAVENOUS

## 2023-06-30 NOTE — Telephone Encounter (Signed)
CRITICAL VALUE STICKER  CRITICAL VALUE:  Hgb 6.4  RECEIVER (on-site recipient of call): Carola Rhine, RN  DATE & TIME NOTIFIED: 09:04  06/30/23  MESSENGER (representative from lab):  MD NOTIFIED: Estrellita Ludwig, MD  TIME OF NOTIFICATION: 09:04  RESPONSE:

## 2023-06-30 NOTE — Patient Instructions (Signed)

## 2023-07-01 LAB — BPAM RBC
Blood Product Expiration Date: 202410072359
Blood Product Expiration Date: 202410072359
ISSUE DATE / TIME: 202409121000
ISSUE DATE / TIME: 202409121226
Unit Type and Rh: 6200
Unit Type and Rh: 6200

## 2023-07-01 LAB — TYPE AND SCREEN
ABO/RH(D): A POS
Antibody Screen: NEGATIVE
Unit division: 0
Unit division: 0

## 2023-07-04 DIAGNOSIS — D469 Myelodysplastic syndrome, unspecified: Secondary | ICD-10-CM | POA: Diagnosis not present

## 2023-07-06 ENCOUNTER — Other Ambulatory Visit: Payer: Self-pay

## 2023-07-06 DIAGNOSIS — D469 Myelodysplastic syndrome, unspecified: Secondary | ICD-10-CM

## 2023-07-07 ENCOUNTER — Inpatient Hospital Stay: Payer: Medicare Other

## 2023-07-07 DIAGNOSIS — R464 Slowness and poor responsiveness: Secondary | ICD-10-CM | POA: Diagnosis not present

## 2023-07-07 DIAGNOSIS — D469 Myelodysplastic syndrome, unspecified: Secondary | ICD-10-CM

## 2023-07-07 DIAGNOSIS — Z79899 Other long term (current) drug therapy: Secondary | ICD-10-CM | POA: Diagnosis not present

## 2023-07-07 LAB — CBC WITH DIFFERENTIAL (CANCER CENTER ONLY)
Abs Immature Granulocytes: 0.19 10*3/uL — ABNORMAL HIGH (ref 0.00–0.07)
Basophils Absolute: 0.2 10*3/uL — ABNORMAL HIGH (ref 0.0–0.1)
Basophils Relative: 3 %
Eosinophils Absolute: 0.7 10*3/uL — ABNORMAL HIGH (ref 0.0–0.5)
Eosinophils Relative: 11 %
HCT: 23.4 % — ABNORMAL LOW (ref 39.0–52.0)
Hemoglobin: 7.7 g/dL — ABNORMAL LOW (ref 13.0–17.0)
Immature Granulocytes: 3 %
Lymphocytes Relative: 8 %
Lymphs Abs: 0.5 10*3/uL — ABNORMAL LOW (ref 0.7–4.0)
MCH: 28.5 pg (ref 26.0–34.0)
MCHC: 32.9 g/dL (ref 30.0–36.0)
MCV: 86.7 fL (ref 80.0–100.0)
Monocytes Absolute: 0.7 10*3/uL (ref 0.1–1.0)
Monocytes Relative: 11 %
Neutro Abs: 3.8 10*3/uL (ref 1.7–7.7)
Neutrophils Relative %: 64 %
Platelet Count: 90 10*3/uL — ABNORMAL LOW (ref 150–400)
RBC: 2.7 MIL/uL — ABNORMAL LOW (ref 4.22–5.81)
RDW: 15.6 % — ABNORMAL HIGH (ref 11.5–15.5)
WBC Count: 6.1 10*3/uL (ref 4.0–10.5)
nRBC: 0 % (ref 0.0–0.2)

## 2023-07-07 LAB — CMP (CANCER CENTER ONLY)
ALT: 59 U/L — ABNORMAL HIGH (ref 0–44)
AST: 23 U/L (ref 15–41)
Albumin: 3.9 g/dL (ref 3.5–5.0)
Alkaline Phosphatase: 45 U/L (ref 38–126)
Anion gap: 5 (ref 5–15)
BUN: 20 mg/dL (ref 8–23)
CO2: 27 mmol/L (ref 22–32)
Calcium: 8.8 mg/dL — ABNORMAL LOW (ref 8.9–10.3)
Chloride: 107 mmol/L (ref 98–111)
Creatinine: 1.02 mg/dL (ref 0.61–1.24)
GFR, Estimated: >60 mL/min (ref >60)
Glucose, Bld: 153 mg/dL — ABNORMAL HIGH (ref 70–99)
Potassium: 4.4 mmol/L (ref 3.5–5.1)
Sodium: 139 mmol/L (ref 135–145)
Total Bilirubin: 0.9 mg/dL (ref 0.3–1.2)
Total Protein: 6 g/dL — ABNORMAL LOW (ref 6.5–8.1)

## 2023-07-07 LAB — SAMPLE TO BLOOD BANK

## 2023-07-07 LAB — PREPARE RBC (CROSSMATCH)

## 2023-07-07 MED ORDER — ACETAMINOPHEN 325 MG PO TABS
650.0000 mg | ORAL_TABLET | Freq: Once | ORAL | Status: AC
  Administered 2023-07-07: 650 mg via ORAL
  Filled 2023-07-07: qty 2

## 2023-07-07 MED ORDER — SODIUM CHLORIDE 0.9% FLUSH
10.0000 mL | INTRAVENOUS | Status: AC | PRN
  Administered 2023-07-07: 10 mL

## 2023-07-07 MED ORDER — METHYLPREDNISOLONE SODIUM SUCC 40 MG IJ SOLR
40.0000 mg | Freq: Once | INTRAMUSCULAR | Status: AC
  Administered 2023-07-07: 40 mg via INTRAVENOUS
  Filled 2023-07-07: qty 1

## 2023-07-07 MED ORDER — HEPARIN SOD (PORK) LOCK FLUSH 100 UNIT/ML IV SOLN
500.0000 [IU] | Freq: Every day | INTRAVENOUS | Status: AC | PRN
  Administered 2023-07-07: 500 [IU]

## 2023-07-07 MED ORDER — SODIUM CHLORIDE 0.9% IV SOLUTION
250.0000 mL | Freq: Once | INTRAVENOUS | Status: AC
  Administered 2023-07-07: 250 mL via INTRAVENOUS

## 2023-07-07 MED ORDER — SODIUM CHLORIDE 0.9% FLUSH
10.0000 mL | Freq: Once | INTRAVENOUS | Status: AC
  Administered 2023-07-07: 10 mL

## 2023-07-07 NOTE — Patient Instructions (Signed)

## 2023-07-08 LAB — TYPE AND SCREEN
ABO/RH(D): A POS
Antibody Screen: NEGATIVE
Unit division: 0

## 2023-07-08 LAB — BPAM RBC
Blood Product Expiration Date: 202410162359
ISSUE DATE / TIME: 202409191117
Unit Type and Rh: 6200

## 2023-07-11 ENCOUNTER — Telehealth: Payer: Self-pay | Admitting: Pharmacy Technician

## 2023-07-11 ENCOUNTER — Other Ambulatory Visit: Payer: Self-pay | Admitting: Pharmacy Technician

## 2023-07-11 ENCOUNTER — Telehealth: Payer: Self-pay | Admitting: Pharmacist

## 2023-07-11 ENCOUNTER — Other Ambulatory Visit: Payer: Self-pay | Admitting: Hematology

## 2023-07-11 ENCOUNTER — Other Ambulatory Visit (HOSPITAL_COMMUNITY): Payer: Self-pay

## 2023-07-11 MED ORDER — PACRITINIB CITRATE 100 MG PO CAPS
100.0000 mg | ORAL_CAPSULE | Freq: Two times a day (BID) | ORAL | 1 refills | Status: DC
  Filled 2023-07-13: qty 60, 30d supply, fill #0
  Filled 2023-08-02: qty 60, 30d supply, fill #1

## 2023-07-11 NOTE — Telephone Encounter (Signed)
Clinical Pharmacist Practitioner Encounter   Received new prescription for Vonjo (pacritinib) for the treatment of JAK2-positive myeloproliferative neoplasm and associated MDS, planned duration until disease progression or unacceptable drug toxicity.  Prescription dose and frequency assessed. MD starting patient on a reduced dose. Per MD, patient will have an ECG checked at his office visit on 07/14/23.  Current medication list in Epic reviewed, no DDIs with pacritinib identified.  Evaluated chart and no patient barriers to medication adherence identified.   Prescription has been e-scribed to the [facility] for benefits analysis and approval.  Oral Oncology Clinic will continue to follow for insurance authorization, copayment issues, initial counseling and start date.   [name], PharmD, BCPS, BCOP, CPP Hematology/Oncology Clinical Pharmacist Practitioner [facility] [phone]  07/11/2023 3:46 PM

## 2023-07-11 NOTE — Telephone Encounter (Signed)
Oral Oncology Patient Advocate Encounter  Prior Authorization for Vonjo has been approved.    PA# [id number] Effective dates: 07/11/23 through 10/18/23  Patients co-pay is $0.    Jinger Neighbors, CPhT-Adv Oncology Pharmacy Patient Advocate [facility] Direct Number: [phone]  Fax: [phone]

## 2023-07-11 NOTE — Telephone Encounter (Signed)
Oral Oncology Patient Advocate Encounter   Received notification that prior authorization for Vonjo is required.   PA submitted on 07/11/23 Key [id number] Status is pending     Jinger Neighbors, CPhT-Adv Oncology Pharmacy Patient Advocate [facility] Direct Number: [phone]  Fax: [phone]

## 2023-07-13 ENCOUNTER — Other Ambulatory Visit: Payer: Self-pay | Admitting: Pharmacist

## 2023-07-13 ENCOUNTER — Other Ambulatory Visit: Payer: Self-pay

## 2023-07-13 ENCOUNTER — Other Ambulatory Visit (HOSPITAL_COMMUNITY): Payer: Self-pay

## 2023-07-13 DIAGNOSIS — D469 Myelodysplastic syndrome, unspecified: Secondary | ICD-10-CM

## 2023-07-13 NOTE — Progress Notes (Signed)
Specialty Pharmacy Initial Fill Coordination Note  Fernando Lee is a 77 y.o. male contacted today regarding initial fill of specialty medication(s) Pacritinib Citrate .  Patient requested [name] at [facility] at [place]  on 07/14/23  Medication will be filled on 07/13/23.   Patient is aware of $0 copayment.

## 2023-07-13 NOTE — Progress Notes (Signed)
Patient education documented in EPIC notes on 07/13/23

## 2023-07-13 NOTE — Telephone Encounter (Signed)
Clinical Pharmacist Practitioner Encounter   PAtient plans on picking up medication from [facility] (Specialty) tomorrow 07/14/23.  Patient Education I spoke with patient's wife Fernando Lee for overview of new oral chemotherapy medication: Vonjo (pacritinib) for the treatment of JAK2-positive myeloproliferative neoplasm and associated MDS, planned duration until disease progression or unacceptable drug toxicity.  Counseled [name] on administration, dosing, side effects, monitoring, drug-food interactions, safe handling, storage, and disposal. Patient will take 1 capsule (100 mg total) by mouth 2 (two) times daily. .  Side effects include but not limited to: nausea, diarrhea, decreased plt.    Reviewed with Fernando Lee importance of keeping a medication schedule and plan for any missed doses.  After discussion with Fernando Lee no patient barriers to medication adherence identified.   [name] voiced understanding and appreciation. All questions answered. Medication handout provided.  Provided Fernando Lee with Oral Chemotherapy Navigation Clinic phone number. Fernando Lee knows to call the office with questions or concerns. Oral Chemotherapy Navigation Clinic will continue to follow.  [name], PharmD, BCPS, BCOP, CPP Hematology/Oncology Clinical Pharmacist Practitioner [place] [phone]  07/13/2023 8:44 AM

## 2023-07-14 ENCOUNTER — Inpatient Hospital Stay: Payer: Medicare Other

## 2023-07-14 ENCOUNTER — Other Ambulatory Visit: Payer: Self-pay

## 2023-07-14 ENCOUNTER — Other Ambulatory Visit (HOSPITAL_COMMUNITY): Payer: Self-pay

## 2023-07-14 ENCOUNTER — Inpatient Hospital Stay: Payer: Medicare Other | Admitting: Hematology

## 2023-07-14 DIAGNOSIS — Z1589 Genetic susceptibility to other disease: Secondary | ICD-10-CM

## 2023-07-14 DIAGNOSIS — D471 Chronic myeloproliferative disease: Secondary | ICD-10-CM

## 2023-07-14 DIAGNOSIS — R464 Slowness and poor responsiveness: Secondary | ICD-10-CM | POA: Diagnosis not present

## 2023-07-14 DIAGNOSIS — D469 Myelodysplastic syndrome, unspecified: Secondary | ICD-10-CM

## 2023-07-14 DIAGNOSIS — Z79899 Other long term (current) drug therapy: Secondary | ICD-10-CM | POA: Diagnosis not present

## 2023-07-14 LAB — CMP (CANCER CENTER ONLY)
ALT: 62 U/L — ABNORMAL HIGH (ref 0–44)
AST: 25 U/L (ref 15–41)
Albumin: 3.8 g/dL (ref 3.5–5.0)
Alkaline Phosphatase: 44 U/L (ref 38–126)
Anion gap: 5 (ref 5–15)
BUN: 22 mg/dL (ref 8–23)
CO2: 27 mmol/L (ref 22–32)
Calcium: 8.6 mg/dL — ABNORMAL LOW (ref 8.9–10.3)
Chloride: 106 mmol/L (ref 98–111)
Creatinine: 0.9 mg/dL (ref 0.61–1.24)
GFR, Estimated: >60 mL/min (ref >60)
Glucose, Bld: 162 mg/dL — ABNORMAL HIGH (ref 70–99)
Potassium: 4.1 mmol/L (ref 3.5–5.1)
Sodium: 138 mmol/L (ref 135–145)
Total Bilirubin: 0.8 mg/dL (ref 0.3–1.2)
Total Protein: 6 g/dL — ABNORMAL LOW (ref 6.5–8.1)

## 2023-07-14 LAB — CBC WITH DIFFERENTIAL (CANCER CENTER ONLY)
Abs Immature Granulocytes: 0.29 10*3/uL — ABNORMAL HIGH (ref 0.00–0.07)
Basophils Absolute: 0.2 10*3/uL — ABNORMAL HIGH (ref 0.0–0.1)
Basophils Relative: 4 %
Eosinophils Absolute: 0.7 10*3/uL — ABNORMAL HIGH (ref 0.0–0.5)
Eosinophils Relative: 13 %
HCT: 22.2 % — ABNORMAL LOW (ref 39.0–52.0)
Hemoglobin: 7.3 g/dL — ABNORMAL LOW (ref 13.0–17.0)
Immature Granulocytes: 5 %
Lymphocytes Relative: 9 %
Lymphs Abs: 0.5 10*3/uL — ABNORMAL LOW (ref 0.7–4.0)
MCH: 28.9 pg (ref 26.0–34.0)
MCHC: 32.9 g/dL (ref 30.0–36.0)
MCV: 87.7 fL (ref 80.0–100.0)
Monocytes Absolute: 0.6 10*3/uL (ref 0.1–1.0)
Monocytes Relative: 10 %
Neutro Abs: 3.2 10*3/uL (ref 1.7–7.7)
Neutrophils Relative %: 59 %
Platelet Count: 83 10*3/uL — ABNORMAL LOW (ref 150–400)
RBC: 2.53 MIL/uL — ABNORMAL LOW (ref 4.22–5.81)
RDW: 15.3 % (ref 11.5–15.5)
WBC Count: 5.5 10*3/uL (ref 4.0–10.5)
nRBC: 0 % (ref 0.0–0.2)

## 2023-07-14 LAB — TYPE AND SCREEN
ABO/RH(D): A POS
Antibody Screen: NEGATIVE
Unit division: 0

## 2023-07-14 LAB — BPAM RBC
Blood Product Expiration Date: 202410232359
ISSUE DATE / TIME: 202409261056
Unit Type and Rh: 6200

## 2023-07-14 LAB — SAMPLE TO BLOOD BANK

## 2023-07-14 LAB — PREPARE RBC (CROSSMATCH)

## 2023-07-14 MED ORDER — METHYLPREDNISOLONE SODIUM SUCC 40 MG IJ SOLR
40.0000 mg | Freq: Once | INTRAMUSCULAR | Status: AC
  Administered 2023-07-14: 40 mg via INTRAVENOUS
  Filled 2023-07-14: qty 1

## 2023-07-14 MED ORDER — HEPARIN SOD (PORK) LOCK FLUSH 100 UNIT/ML IV SOLN
250.0000 [IU] | INTRAVENOUS | Status: AC | PRN
  Administered 2023-07-14: 250 [IU]

## 2023-07-14 MED ORDER — SODIUM CHLORIDE 0.9% IV SOLUTION
250.0000 mL | Freq: Once | INTRAVENOUS | Status: AC
  Administered 2023-07-14: 250 mL via INTRAVENOUS

## 2023-07-14 MED ORDER — ACETAMINOPHEN 325 MG PO TABS
650.0000 mg | ORAL_TABLET | Freq: Once | ORAL | Status: AC
  Administered 2023-07-14: 650 mg via ORAL
  Filled 2023-07-14: qty 2

## 2023-07-14 MED ORDER — SODIUM CHLORIDE 0.9% FLUSH
10.0000 mL | INTRAVENOUS | Status: AC | PRN
  Administered 2023-07-14: 10 mL

## 2023-07-14 NOTE — Progress Notes (Signed)
HEMATOLOGY/ONCOLOGY PROGRESS NOTE:   Date of Service: 07/14/23  Patient Care Team: Jackelyn Poling, DO as PCP - General (Family Medicine)  CHIEF COMPLAINTS:  Follow-up for continued evaluation and management of JAK2 positive myeloproliferative neoplasm/MDS  INTERVAL HISTORY:  Mr. Adar Rase is a 77 y.o. male here for continued evaluation and management of his MPN/MDS with refractory anemia and thrombocytosis. Patient was last seen by me on 06/09/2023 and complained of bilateral leg swelling, left-sided abdominal discomfort, diarrhea, and weight loss.   He was seen by Dr. Lowella Bandy on 07/04/2023.   Today, he is accompanied by his wife. Patient has been off of Revlimid for four weeks. He continues to have some diarrhea which is not significantly bothersome at this time.   Patient denies any infection issues or overt bleeding issues, but does have some bruising in his arms. He reports mild leg swelling. Patient generally takes half a tablet of Lasix a day.   He reports discomfort over the spleen after eating. Patient denies any acute pain but does endorse a sense of fullness. He has no history of stroke or MI.   Patient is planning to travel to the beach this weekend and will return on Wednesday of next week. We will plan to start Pacritinib  once the patient returns from vacation.   Patient is not interested in receiving the flu shot at this time.   MEDICAL HISTORY:  Past Medical History:  Diagnosis Date   Arthritis of right knee    Cervicalgia    Chest discomfort    normal stress echo   Chest pain    Colon polyps    Dyslipidemia    Fluttering sensation of heart    GERD without esophagitis    Hematochezia    Hyperglycemia    Hyperlipidemia    Internal hemorrhoids    Kidney stones    Male erectile dysfunction, unspecified    Mixed dyslipidemia    Mixed hyperlipidemia    Ocular migraine    PAC (premature atrial contraction)    Personal history of colonic polyps     Prediabetes    Residual hemorrhoidal skin tags    Spleen enlarged    nov 2023 hospitalized   Unspecified hemorrhoids     SURGICAL HISTORY: Past Surgical History:  Procedure Laterality Date   BONE MARROW BIOPSY     IR IMAGING GUIDED PORT INSERTION  09/14/2022   KIDNEY STONE SURGERY     retrieval    SOCIAL HISTORY: Social History   Socioeconomic History   Marital status: Married    Spouse name: Not on file   Number of children: Not on file   Years of education: Not on file   Highest education level: Not on file  Occupational History   Not on file  Tobacco Use   Smoking status: Former    Types: Cigarettes   Smokeless tobacco: Never  Substance and Sexual Activity   Alcohol use: Yes    Alcohol/week: 1.0 standard drink of alcohol    Types: 1 Cans of beer per week   Drug use: Not on file   Sexual activity: Not on file  Other Topics Concern   Not on file  Social History Narrative   Not on file   Social Determinants of Health   Financial Resource Strain: Not on file  Food Insecurity: Unknown (08/31/2022)   Hunger Vital Sign    Worried About Running Out of Food in the Last Year: Never true    Ran  Out of Food in the Last Year: Not on file  Transportation Needs: No Transportation Needs (08/31/2022)   PRAPARE - Administrator, Civil Service (Medical): No    Lack of Transportation (Non-Medical): No  Physical Activity: Not on file  Stress: Not on file  Social Connections: Not on file  Intimate Partner Violence: Not on file    FAMILY HISTORY: Family History  Problem Relation Age of Onset   Stroke Brother    Congestive Heart Failure Brother     ALLERGIES:  has No Known Allergies.  MEDICATIONS:  Current Outpatient Medications  Medication Sig Dispense Refill   ALFUZOSIN HCL ER PO Take 1 tablet by mouth daily after supper.     aspirin EC 81 MG tablet Take 81 mg by mouth daily. Swallow whole.     B Complex Vitamins (B COMPLEX PO) Take 1 tablet by mouth  daily.     Cholecalciferol (VITAMIN D3) 50 MCG (2000 UT) TABS Take 2,000 Units by mouth daily.     docusate sodium (COLACE) 100 MG capsule Take 100 mg by mouth 2 (two) times daily.     doxycycline (VIBRAMYCIN) 100 MG capsule Take 1 capsule (100 mg total) by mouth 2 (two) times daily. 20 capsule 0   FIBER PO Take 1 capsule by mouth daily. Unknown strength     fish oil-omega-3 fatty acids 1000 MG capsule Take 1 g by mouth 2 (two) times daily. Strength 300mg /1500mg      Flaxseed, Linseed, (FLAXSEED OIL) 1000 MG CAPS Take 1,000 mg by mouth daily.     furosemide (LASIX) 20 MG tablet Take 1 tablet (20 mg total) by mouth daily. 30 tablet 1   GLUCOSAMINE CHONDROITIN MSM PO Take 1 tablet by mouth daily. Strength 1500/1500     Misc Natural Products (PROSTATE SUPPORT PO) Take 1 capsule by mouth daily. Unknown strength     Multiple Vitamin (MULTIVITAMIN) tablet Take 1 tablet by mouth daily. Unknown strength     pacritinib citrate (VONJO) 100 MG capsule Take 1 capsule (100 mg total) by mouth 2 (two) times daily. 60 capsule 1   potassium chloride SA (KLOR-CON M) 20 MEQ tablet Take 1 tablet (20 mEq total) by mouth 2 (two) times daily. Take while on lasix 60 tablet 0   psyllium (METAMUCIL) 58.6 % powder Take 1 packet by mouth daily.     senna-docusate (SENNA S) 8.6-50 MG tablet Take 2 tablets by mouth at bedtime. 60 tablet 1   traMADol (ULTRAM) 50 MG tablet Take 50 mg by mouth as needed for moderate pain or severe pain.     Turmeric (QC TUMERIC COMPLEX PO) Take 750 mg by mouth daily.     vitamin C (ASCORBIC ACID) 250 MG tablet Take 500 mg by mouth daily.     No current facility-administered medications for this visit.    REVIEW OF SYSTEMS:    10 Point review of Systems was done is negative except as noted above.   PHYSICAL EXAMINATION: VSS GENERAL:alert, in no acute distress and comfortable SKIN: no acute rashes, no significant lesions EYES: conjunctiva are pink and non-injected, sclera  anicteric OROPHARYNX: MMM, no exudates, no oropharyngeal erythema or ulceration NECK: supple, no JVD LYMPH:  no palpable lymphadenopathy in the cervical, axillary or inguinal regions LUNGS: clear to auscultation b/l with normal respiratory effort HEART: regular rate & rhythm ABDOMEN:  normoactive bowel sounds , non tender, not distended. Extremity: no pedal edema PSYCH: alert & oriented x 3 with fluent speech NEURO: no  focal motor/sensory deficits   LABORATORY DATA:  I have reviewed the data as listed .    Latest Ref Rng & Units 07/07/2023    8:27 AM 06/30/2023    8:21 AM 06/23/2023    8:20 AM  CBC  WBC 4.0 - 10.5 K/uL 6.1  5.8  6.8   Hemoglobin 13.0 - 17.0 g/dL 7.7  6.4  6.2   Hematocrit 39.0 - 52.0 % 23.4  19.3  18.4   Platelets 150 - 400 K/uL 90  102  76      Iron/TIBC/Ferritin/ %Sat    Component Value Date/Time   IRON 180 09/30/2022 0924   TIBC 193 (L) 09/30/2022 0924   FERRITIN 1,450 (H) 09/30/2022 0924   IRONPCTSAT 93 (H) 09/30/2022 0924      Latest Ref Rng & Units 07/07/2023    8:27 AM 06/23/2023    8:20 AM 06/16/2023    8:35 AM  CMP  Glucose 70 - 99 mg/dL 147  829  562   BUN 8 - 23 mg/dL 20  20  20    Creatinine 0.61 - 1.24 mg/dL 1.30  8.65  7.84   Sodium 135 - 145 mmol/L 139  137  138   Potassium 3.5 - 5.1 mmol/L 4.4  4.2  4.1   Chloride 98 - 111 mmol/L 107  104  106   CO2 22 - 32 mmol/L 27  27  28    Calcium 8.9 - 10.3 mg/dL 8.8  8.6  8.5   Total Protein 6.5 - 8.1 g/dL 6.0  5.7  5.8   Total Bilirubin 0.3 - 1.2 mg/dL 0.9  0.9  0.6   Alkaline Phos 38 - 126 U/L 45  42  39   AST 15 - 41 U/L 23  23  20    ALT 0 - 44 U/L 59  63  58     Lab Results  Component Value Date   LDH 110 01/04/2022    02/23/2021 BCR ABL    02/23/2021 JAK2   Surgical Pathology  CASE: [id number]  PATIENT: Izreal Nesbitt  Bone Marrow Report  Clinical History: MPN with progressive anemia  (BH)  DIAGNOSIS:   BONE MARROW, ASPIRATE, CLOT, CORE:  -Hypercellular bone marrow with  features of myeloid neoplasm  -See comment   PERIPHERAL BLOOD:  -Macrocytic anemia  -Leukocytosis  -Thrombocytosis   COMMENT:   The bone marrow/peripheral blood show persistent involvement by  previously known myeloid neoplasm.  There is a myeloproliferative  component as supported by previous JAK2 positivity.  However, there are  also dyspoietic changes primarily involving the megakaryocytic cell line  with numerous hypolobated/unilobated forms in addition to  dysgranulopoiesis to a lesser extent.  This is associated with  eosinophilia.  It is not entirely clear whether the overall findings  represent a myeloproliferative neoplasm with treatment related changes  or represent a primary myeloproliferative/myelodysplastic neoplasm  including but not limited to myeloid neoplasms with eosinophilia.  Correlation with cytogenetic and FISH studies strongly recommended.      RADIOGRAPHIC STUDIES: I have personally reviewed the radiological images as listed and agreed with the findings in the report. No results found.   IR IMAGING GUIDED PORT INSERTION  Result Date: 09/14/2022 INDICATION: Poor IV access.  Myelodysplastic syndrome EXAM: IMPLANTED PORT A CATH PLACEMENT WITH ULTRASOUND AND FLUOROSCOPIC GUIDANCE MEDICATIONS: None ANESTHESIA/SEDATION: Moderate (conscious) sedation was employed during this procedure. A total of Versed 3 mg and Fentanyl 100 mcg was administered intravenously. Moderate Sedation Time: 20 minutes. The patient's  level of consciousness and vital signs were monitored continuously by radiology nursing throughout the procedure under my direct supervision. FLUOROSCOPY TIME:  Fluoroscopic dose; 1 mGy COMPLICATIONS: None immediate. PROCEDURE: The procedure, risks, benefits, and alternatives were explained to the patient. Questions regarding the procedure were encouraged and answered. The patient understands and consents to the procedure. The RIGHT neck and chest were prepped  with chlorhexidine in a sterile fashion, and a sterile drape was applied covering the operative field. Maximum barrier sterile technique with sterile gowns and gloves were used for the procedure. A timeout was performed prior to the initiation of the procedure. Local anesthesia was provided with 1% lidocaine with epinephrine. After creating a small venotomy incision, a micropuncture kit was utilized to access the internal jugular vein under direct, real-time ultrasound guidance. Ultrasound image documentation was performed. The microwire was kinked to measure appropriate catheter length. A subcutaneous port pocket was then created along the upper chest wall utilizing a combination of sharp and blunt dissection. The pocket was irrigated with sterile saline. A single lumen ISP power injectable port was chosen for placement. The 8 Fr catheter was tunneled from the port pocket site to the venotomy incision. The port was placed in the pocket. The external catheter was trimmed to appropriate length. At the venotomy, an 8 Fr peel-away sheath was placed over a guidewire under fluoroscopic guidance. The catheter was then placed through the sheath and the sheath was removed. Final catheter positioning was confirmed and documented with a fluoroscopic spot radiograph. The port was accessed with a Huber needle, aspirated and flushed with heparinized saline. The port pocket incision was closed with interrupted 3-0 Vicryl suture then Dermabond was applied, including at the venotomy incision. Dressings were placed. The patient tolerated the procedure well without immediate post procedural complication. IMPRESSION: Successful placement of a RIGHT internal jugular approach power injectable Port-A-Cath. The tip of the catheter is positioned within the proximal RIGHT atrium. The catheter is ready for immediate use. Roanna Banning, MD [facility] Electronically Signed   By: Roanna Banning M.D.   On: 09/14/2022 17:18   CT ANGIO GI BLEED  Result Date: 08/31/2022 CLINICAL DATA:  Acute mesenteric ischemia. Abdominal pain. History of myelodysplastic syndrome. EXAM: CTA ABDOMEN AND PELVIS WITHOUT AND WITH CONTRAST TECHNIQUE: Multidetector CT imaging of the abdomen and pelvis was performed using the standard protocol during bolus administration of intravenous contrast. Multiplanar reconstructed images and MIPs were obtained and reviewed to evaluate the vascular anatomy. RADIATION DOSE REDUCTION: This exam was performed according to the departmental dose-optimization program which includes automated exposure control, adjustment of the mA and/or kV according to patient size and/or use of iterative reconstruction technique. CONTRAST:  OMNIPAQUE IOHEXOL 350 MG/ML SOLN COMPARISON:  Abdominal ultrasound examination 04/03/2021 FINDINGS: VASCULAR Aorta: Normal caliber abdominal aorta. Scattered atherosclerotic calcifications. No dissection. Celiac: Normal SMA: Normal Renals: Normal IMA: Patent Inflow: Normal Proximal Outflow: Normal Veins: Normal Review of the MIP images confirms the above findings. NON-VASCULAR Lower chest: Small left pleural effusion and bibasilar atelectasis. The heart is normal in size. No pericardial effusion. There is a moderate to large hiatal hernia. Hepatobiliary: No hepatic lesions or intrahepatic biliary dilatation. The gallbladder is unremarkable. No common bile duct dilatation. Pancreas: No mass, inflammation or ductal dilatation. Spleen: Massive splenomegaly. The spleen measures 20 x 16 x 11 cm. No splenic lesions or splenic infarct. Adrenals/Urinary Tract: The left kidney is displaced medially and inferiorly by the enlarged spleen. No worrisome renal  lesions,, hydronephrosis or pyelonephritis. There is a lower pole right renal calculus and there are 2 distal left ureteral calculi more proximal calculus measures 4 mm and the more distal calculus measures 5 mm. I do  not see any hydroureter or hydronephrosis. Stomach/Bowel: The stomach, duodenum, small bowel and colon are grossly normal. No inflammatory changes, mass lesions or obstructive findings. Lymphatic: No abdominal or pelvic lymphadenopathy. Reproductive: The prostate gland is enlarged. The seminal vesicles are unremarkable. Other: No pelvic mass or adenopathy. No free pelvic fluid collections. No inguinal mass or adenopathy. No abdominal wall hernia or subcutaneous lesions. Musculoskeletal: No significant bony findings. IMPRESSION: 1. Normal caliber abdominal aorta and no dissection. The branch vessels are normal. 2. Splenomegaly. 3. Two distal left ureteral calculi but no hydroureter or hydronephrosis. 4. Lower pole right renal calculus. 5. Moderate to large hiatal hernia. 6. Small left pleural effusion and bibasilar atelectasis. Electronically Signed   By: Rudie Meyer M.D.   On: 08/31/2022 18:13     ASSESSMENT & PLAN:   77 y.o. male with   #1 JAK2-positive myeloproliferative neoplasm-primarily presenting with thrombocytosis and associated MDS causing refractory anemia -No polycythemia.  -Mild leukocytosis.  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 Has grade 1 out of 3 reticulin fibrosis.  Uncertain if this is primary or secondary. -LDH has remained within normal limits. 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 -CT Angio GI bleed scan from 09/01/2022 showed kidney stones.and significant splenomegaly  PLAN:  -patient has been off of Revlimid for 4 weeks now -Discussed lab results on 07/14/23 in detail with patient. CBC showed WBC of 5.5K, hemoglobin of 7.3, and platelets of 83K. -platelets show mild fluctuation, which is not concerning at this time -CMP shows that potassium level is normal at 4.1 mmol/L. Okay to take a half tablet of Lasix once a day.  -hold aspirin at this time -would recommend trying Pacritinib to shrink the spleen given patient's JAK2  mutation. Pacritinib  would be the least likely to suppress the bone marrow -Patient will be going on vacation and will return on October 2nd. We shall plan to start Pacritinib once the patient returns from vacation.  -Will start patient on 100 MG once a day to see how he tolerates it for the first week once he returns from vacation. He can then take 100 MG twice a day if he is able to tolerate. -patient shall return to clinic 2 weeks after starting Pacritinib  -would recommend patient to stay UTD with age-appropriate vaccinations including flu shot.  -discussed that if there is some fatigue related to cytokine release, Pacritinib might help symptoms -discussed option for patient to receive an additional unit of PRBCs today prior to his vacation. Patient would only like one unit at this time. Will proceed with 1 unit of PRBCs today -proceed with EKG today to evaluate baseline -- normal Qtc -do not anticipate abnormal QT intervals -answered all of patient's and his wife's questions in detail  -discussed details of potential risks with different range levels of platelets -patient shall return for labs and to receive a transfusion on Thursday of next week, 07/21/2023.  -there may be a role for clinical trials for overlap syndrome   Follow-up: RTC with Dr Candise Che with labs in 3 weeks PLz schedule for weekly labs and 1 unit of PRBC x 6  The total time spent in the appointment was 30 minutes* .  All of the patient's questions were answered with apparent satisfaction. The patient knows  to call the clinic with any problems, questions or concerns.   Wyvonnia Lora MD MS AAHIVMS [facility] Hematology/Oncology Physician [facility]  .*Total Encounter Time as defined by the Centers for Medicare and Medicaid Services includes, in addition to the face-to-face time of a patient visit (documented in the note above) non-face-to-face time: obtaining and reviewing outside history, ordering and reviewing  medications, tests or procedures, care coordination (communications with other health care professionals or caregivers) and documentation in the medical record.    I,Mitra Faeizi,acting as a Neurosurgeon for Wyvonnia Lora, MD.,have documented all relevant documentation on the behalf of Wyvonnia Lora, MD,as directed by  Wyvonnia Lora, MD while in the presence of Wyvonnia Lora, MD.  .I have reviewed the above documentation for accuracy and completeness, and I agree with the above. Johney Maine MD

## 2023-07-19 ENCOUNTER — Other Ambulatory Visit: Payer: Self-pay | Admitting: *Deleted

## 2023-07-19 DIAGNOSIS — D469 Myelodysplastic syndrome, unspecified: Secondary | ICD-10-CM

## 2023-07-20 ENCOUNTER — Encounter: Payer: Self-pay | Admitting: Hematology

## 2023-07-20 ENCOUNTER — Other Ambulatory Visit: Payer: Self-pay

## 2023-07-21 ENCOUNTER — Inpatient Hospital Stay: Payer: Medicare Other

## 2023-07-21 ENCOUNTER — Other Ambulatory Visit: Payer: Self-pay

## 2023-07-21 ENCOUNTER — Other Ambulatory Visit: Payer: Medicare Other

## 2023-07-21 ENCOUNTER — Inpatient Hospital Stay: Payer: Medicare Other | Attending: Hematology

## 2023-07-21 DIAGNOSIS — Z79899 Other long term (current) drug therapy: Secondary | ICD-10-CM | POA: Insufficient documentation

## 2023-07-21 DIAGNOSIS — D469 Myelodysplastic syndrome, unspecified: Secondary | ICD-10-CM

## 2023-07-21 DIAGNOSIS — D464 Refractory anemia, unspecified: Secondary | ICD-10-CM | POA: Diagnosis not present

## 2023-07-21 LAB — CBC WITH DIFFERENTIAL (CANCER CENTER ONLY)
Abs Immature Granulocytes: 0.14 10*3/uL — ABNORMAL HIGH (ref 0.00–0.07)
Basophils Absolute: 0.1 10*3/uL (ref 0.0–0.1)
Basophils Relative: 3 %
Eosinophils Absolute: 0.7 10*3/uL — ABNORMAL HIGH (ref 0.0–0.5)
Eosinophils Relative: 15 %
HCT: 20.4 % — ABNORMAL LOW (ref 39.0–52.0)
Hemoglobin: 6.6 g/dL — CL (ref 13.0–17.0)
Immature Granulocytes: 3 %
Lymphocytes Relative: 13 %
Lymphs Abs: 0.6 10*3/uL — ABNORMAL LOW (ref 0.7–4.0)
MCH: 28.1 pg (ref 26.0–34.0)
MCHC: 32.4 g/dL (ref 30.0–36.0)
MCV: 86.8 fL (ref 80.0–100.0)
Monocytes Absolute: 0.4 10*3/uL (ref 0.1–1.0)
Monocytes Relative: 10 %
Neutro Abs: 2.5 10*3/uL (ref 1.7–7.7)
Neutrophils Relative %: 56 %
Platelet Count: 56 10*3/uL — ABNORMAL LOW (ref 150–400)
RBC: 2.35 MIL/uL — ABNORMAL LOW (ref 4.22–5.81)
RDW: 14.7 % (ref 11.5–15.5)
WBC Count: 4.4 10*3/uL (ref 4.0–10.5)
nRBC: 0 % (ref 0.0–0.2)

## 2023-07-21 LAB — SAMPLE TO BLOOD BANK

## 2023-07-21 LAB — PREPARE RBC (CROSSMATCH)

## 2023-07-21 MED ORDER — SODIUM CHLORIDE 0.9% IV SOLUTION
250.0000 mL | Freq: Once | INTRAVENOUS | Status: AC
  Administered 2023-07-21: 250 mL via INTRAVENOUS

## 2023-07-21 MED ORDER — HEPARIN SOD (PORK) LOCK FLUSH 100 UNIT/ML IV SOLN
250.0000 [IU] | INTRAVENOUS | Status: AC | PRN
  Administered 2023-07-21: 250 [IU]

## 2023-07-21 MED ORDER — ACETAMINOPHEN 325 MG PO TABS
650.0000 mg | ORAL_TABLET | Freq: Once | ORAL | Status: AC
  Administered 2023-07-21: 650 mg via ORAL
  Filled 2023-07-21: qty 2

## 2023-07-21 MED ORDER — SODIUM CHLORIDE 0.9% FLUSH
10.0000 mL | Freq: Once | INTRAVENOUS | Status: AC
  Administered 2023-07-21: 10 mL

## 2023-07-21 MED ORDER — SODIUM CHLORIDE 0.9% FLUSH
3.0000 mL | INTRAVENOUS | Status: AC | PRN
  Administered 2023-07-21: 3 mL

## 2023-07-21 MED ORDER — METHYLPREDNISOLONE SODIUM SUCC 40 MG IJ SOLR
40.0000 mg | Freq: Once | INTRAMUSCULAR | Status: AC
  Administered 2023-07-21: 40 mg via INTRAVENOUS
  Filled 2023-07-21: qty 1

## 2023-07-21 NOTE — Patient Instructions (Signed)

## 2023-07-22 ENCOUNTER — Inpatient Hospital Stay: Payer: Medicare Other | Attending: Hematology

## 2023-07-22 ENCOUNTER — Inpatient Hospital Stay: Payer: Medicare Other

## 2023-07-22 ENCOUNTER — Inpatient Hospital Stay: Payer: Medicare Other | Admitting: Hematology

## 2023-07-22 LAB — BPAM RBC
Blood Product Expiration Date: 202411042359
ISSUE DATE / TIME: 202410031417
Unit Type and Rh: 6200

## 2023-07-22 LAB — TYPE AND SCREEN
ABO/RH(D): A POS
Antibody Screen: NEGATIVE
Unit division: 0

## 2023-07-25 ENCOUNTER — Other Ambulatory Visit: Payer: Medicare Other

## 2023-07-26 ENCOUNTER — Other Ambulatory Visit: Payer: Self-pay

## 2023-07-26 DIAGNOSIS — D469 Myelodysplastic syndrome, unspecified: Secondary | ICD-10-CM

## 2023-07-27 ENCOUNTER — Inpatient Hospital Stay: Payer: Medicare Other

## 2023-07-27 ENCOUNTER — Other Ambulatory Visit: Payer: Self-pay

## 2023-07-27 DIAGNOSIS — Z79899 Other long term (current) drug therapy: Secondary | ICD-10-CM | POA: Diagnosis not present

## 2023-07-27 DIAGNOSIS — D469 Myelodysplastic syndrome, unspecified: Secondary | ICD-10-CM

## 2023-07-27 DIAGNOSIS — D464 Refractory anemia, unspecified: Secondary | ICD-10-CM | POA: Diagnosis not present

## 2023-07-27 LAB — CMP (CANCER CENTER ONLY)
ALT: 60 U/L — ABNORMAL HIGH (ref 0–44)
AST: 22 U/L (ref 15–41)
Albumin: 4 g/dL (ref 3.5–5.0)
Alkaline Phosphatase: 41 U/L (ref 38–126)
Anion gap: 5 (ref 5–15)
BUN: 21 mg/dL (ref 8–23)
CO2: 29 mmol/L (ref 22–32)
Calcium: 8.9 mg/dL (ref 8.9–10.3)
Chloride: 105 mmol/L (ref 98–111)
Creatinine: 1.03 mg/dL (ref 0.61–1.24)
GFR, Estimated: >60 mL/min (ref >60)
Glucose, Bld: 136 mg/dL — ABNORMAL HIGH (ref 70–99)
Potassium: 4.2 mmol/L (ref 3.5–5.1)
Sodium: 139 mmol/L (ref 135–145)
Total Bilirubin: 0.8 mg/dL (ref 0.3–1.2)
Total Protein: 6.1 g/dL — ABNORMAL LOW (ref 6.5–8.1)

## 2023-07-27 LAB — CBC WITH DIFFERENTIAL (CANCER CENTER ONLY)
Abs Immature Granulocytes: 0.11 10*3/uL — ABNORMAL HIGH (ref 0.00–0.07)
Basophils Absolute: 0.1 10*3/uL (ref 0.0–0.1)
Basophils Relative: 2 %
Eosinophils Absolute: 0.5 10*3/uL (ref 0.0–0.5)
Eosinophils Relative: 14 %
HCT: 21.3 % — ABNORMAL LOW (ref 39.0–52.0)
Hemoglobin: 7 g/dL — ABNORMAL LOW (ref 13.0–17.0)
Immature Granulocytes: 3 %
Lymphocytes Relative: 14 %
Lymphs Abs: 0.5 10*3/uL — ABNORMAL LOW (ref 0.7–4.0)
MCH: 28.7 pg (ref 26.0–34.0)
MCHC: 32.9 g/dL (ref 30.0–36.0)
MCV: 87.3 fL (ref 80.0–100.0)
Monocytes Absolute: 0.3 10*3/uL (ref 0.1–1.0)
Monocytes Relative: 9 %
Neutro Abs: 2.2 10*3/uL (ref 1.7–7.7)
Neutrophils Relative %: 58 %
Platelet Count: 41 10*3/uL — ABNORMAL LOW (ref 150–400)
RBC: 2.44 MIL/uL — ABNORMAL LOW (ref 4.22–5.81)
RDW: 14.6 % (ref 11.5–15.5)
WBC Count: 3.7 10*3/uL — ABNORMAL LOW (ref 4.0–10.5)
nRBC: 0 % (ref 0.0–0.2)

## 2023-07-27 LAB — PREPARE RBC (CROSSMATCH)

## 2023-07-27 LAB — SAMPLE TO BLOOD BANK

## 2023-07-27 MED ORDER — SODIUM CHLORIDE 0.9% IV SOLUTION
250.0000 mL | Freq: Once | INTRAVENOUS | Status: AC
  Administered 2023-07-27: 100 mL via INTRAVENOUS

## 2023-07-27 MED ORDER — SODIUM CHLORIDE 0.9% FLUSH
10.0000 mL | Freq: Once | INTRAVENOUS | Status: AC
  Administered 2023-07-27: 10 mL

## 2023-07-27 MED ORDER — HEPARIN SOD (PORK) LOCK FLUSH 100 UNIT/ML IV SOLN
500.0000 [IU] | Freq: Every day | INTRAVENOUS | Status: AC | PRN
  Administered 2023-07-27: 500 [IU]

## 2023-07-27 MED ORDER — SODIUM CHLORIDE 0.9% FLUSH
10.0000 mL | INTRAVENOUS | Status: AC | PRN
  Administered 2023-07-27: 10 mL

## 2023-07-27 MED ORDER — METHYLPREDNISOLONE SODIUM SUCC 40 MG IJ SOLR
40.0000 mg | Freq: Once | INTRAMUSCULAR | Status: AC
  Administered 2023-07-27: 40 mg via INTRAVENOUS
  Filled 2023-07-27: qty 1

## 2023-07-27 MED ORDER — ACETAMINOPHEN 325 MG PO TABS
650.0000 mg | ORAL_TABLET | Freq: Once | ORAL | Status: AC
  Administered 2023-07-27: 650 mg via ORAL
  Filled 2023-07-27: qty 2

## 2023-07-27 NOTE — Patient Instructions (Signed)
Blood Transfusion, Adult A blood transfusion is a procedure in which you receive blood or a type of blood cell (blood component) through an IV. You may need a blood transfusion when you have a low blood count, which is a low number of any blood cell. This may result from a bleeding disorder, illness, injury, or surgery. The blood may come from a donor, or you may be able to have your own blood collected and stored (autologous blood donation) before a planned surgery. The blood given in a transfusion may be made up of different blood components. You may receive: Red blood cells. These carry oxygen to the cells in the body. Platelets. These help your blood to clot. Plasma. This is the liquid part of your blood. It carries proteins and other substances throughout the body. White blood cells. These help you fight infections. If you have hemophilia or another clotting disorder, you may also receive other types of blood products. Depending on the type of blood product, this procedure may take 1-4 hours to complete. Tell a health care provider about: Any bleeding problems you have. Any previous reactions you have had during a blood transfusion. Any allergies you have. All medicines you are taking, including vitamins, herbs, eye drops, creams, and over-the-counter medicines. Any surgeries you have had. Any medical conditions you have. Whether you are pregnant or may be pregnant. What are the risks? Talk with your health care provider about risks. The most common problems include: A mild allergic reaction, such as red, swollen areas of skin (hives) and itching. Fever or chills. This may be the body's response to new blood cells received. This may occur during or up to 4 hours after the transfusion. More serious problems may include: A serious allergic reaction that causes difficulty breathing or swelling around the face and lips. Transfusion-associated circulatory overload (TACO), or too much fluid in  the lungs. This may cause breathing problems. Transfusion-related acute lung injury (TRALI), which causes breathing difficulty and low oxygen in the blood. This can occur within hours of the transfusion or several days later. Iron overload. This can happen after receiving many blood transfusions over a period of time. Infection or virus being transmitted. This is rare because donated blood is carefully tested before it is given. Hemolytic transfusion reaction. This is rare. It happens when the body's defense system (immune system)tries to attack the new blood cells. Symptoms may include fever, chills, nausea, low blood pressure, and low back or chest pain. Transfusion-associated graft-versus-host disease (TAGVHD). This is rare. It happens when donated cells attack the body's healthy tissues. What happens before the procedure? You will have a blood test to check your blood type. This test is done to know what kind of blood your body will accept and to match it to the donor blood. If you are going to have a planned surgery, you may be able to do an autologous blood donation. This may be done in case you need to have a transfusion. You will have your temperature, blood pressure, and pulse checked before the transfusion. If you have had an allergic reaction to a transfusion in the past, you may be given medicine to help prevent a reaction. This medicine may be given to you by mouth (orally) or through an IV. What happens during the procedure?  An IV will be inserted into one of your veins. The bag of blood will be attached to your IV. The blood will then enter through your vein. Your temperature, blood pressure,   and pulse will be monitored during the transfusion. This monitoring is done to detect early signs of a transfusion reaction. Tell your nurse right away if you have any of these symptoms during the transfusion: Shortness of breath or trouble breathing. Chest or back pain. Fever or  chills. Itching or hives. If you have any signs or symptoms of a reaction, your transfusion will be stopped and you may be given medicine. When the transfusion is complete, your IV will be removed. Pressure may be applied to the IV site for a few minutes. A bandage (dressing)will be applied. The procedure may vary among health care providers and hospitals. What happens after the procedure? Your temperature, blood pressure, pulse, breathing rate, and blood oxygen level will be monitored until you leave the hospital or clinic. Your blood may be tested to see how you have responded to the transfusion. You may be warmed with fluids or blankets to maintain a normal body temperature. If you receive your blood transfusion in an outpatient setting, you will be told whom to contact to report any reactions. Where to find more information Visit the [employer]: redcross.org Summary A blood transfusion is a procedure in which you receive blood or a type of blood cell (blood component) through an IV. The blood given in a transfusion may be made up of different blood components. You may receive red blood cells, platelets, plasma, or white blood cells depending on the condition treated. Your temperature, blood pressure, and pulse will be monitored before, during, and after the transfusion. After the transfusion, your blood may be tested to see how your body has responded. This information is not intended to replace advice given to you by your health care provider. Make sure you discuss any questions you have with your health care provider. Document Revised: 01/01/2022 Document Reviewed: 01/01/2022 Elsevier Patient Education  2024 [employer].  

## 2023-07-28 ENCOUNTER — Inpatient Hospital Stay: Payer: Medicare Other

## 2023-07-28 LAB — TYPE AND SCREEN
ABO/RH(D): A POS
Antibody Screen: NEGATIVE
Unit division: 0

## 2023-07-28 LAB — BPAM RBC
Blood Product Expiration Date: 202411062359
ISSUE DATE / TIME: 202410091523
Unit Type and Rh: 6200

## 2023-07-29 DIAGNOSIS — Z0000 Encounter for general adult medical examination without abnormal findings: Secondary | ICD-10-CM | POA: Diagnosis not present

## 2023-07-31 ENCOUNTER — Other Ambulatory Visit: Payer: Self-pay | Admitting: Hematology

## 2023-07-31 DIAGNOSIS — D469 Myelodysplastic syndrome, unspecified: Secondary | ICD-10-CM

## 2023-08-01 ENCOUNTER — Encounter: Payer: Self-pay | Admitting: Hematology

## 2023-08-02 ENCOUNTER — Other Ambulatory Visit (HOSPITAL_COMMUNITY): Payer: Self-pay

## 2023-08-02 ENCOUNTER — Other Ambulatory Visit: Payer: Self-pay

## 2023-08-02 ENCOUNTER — Other Ambulatory Visit (HOSPITAL_COMMUNITY): Payer: Self-pay | Admitting: Pharmacy Technician

## 2023-08-02 NOTE — Progress Notes (Signed)
Specialty Pharmacy Refill Coordination Note  Fernando Lee is a 77 y.o. male contacted today regarding refills of specialty medication(s) Pacritinib Citrate  Spoke with Wife  Patient requested [name] at [facility] at [place] date: 08/17/23   Medication will be filled on 08/16/23.

## 2023-08-02 NOTE — Progress Notes (Signed)
Specialty Pharmacy Ongoing Clinical Assessment Note  Fernando Lee is a 77 y.o. male who is being followed by the specialty pharmacy service for RxSp Oncology   Patient's specialty medication(s) reviewed today: Pacritinib Citrate   Missed doses in the last 4 weeks: 0   Patient/Caregiver did not have any additional questions or concerns.   Therapeutic benefit summary: Unable to assess   Adverse events/side effects summary: No adverse events/side effects (Patient wife states that he has needed multiple transfusions but that started before he began taking Vonjo and she is not sure they are in any way related.)   Patient's therapy is appropriate to: Continue    Goals Addressed             This Visit's Progress    Slow Disease Progression       Patient is  unable to be assessed as therapy has recently been initiated . Patient will maintain adherence and adhere to provider and/or lab appointments           Follow up:  3 months  Otto Herb Specialty Pharmacist

## 2023-08-03 ENCOUNTER — Other Ambulatory Visit: Payer: Self-pay

## 2023-08-03 ENCOUNTER — Inpatient Hospital Stay: Payer: Medicare Other

## 2023-08-03 DIAGNOSIS — D469 Myelodysplastic syndrome, unspecified: Secondary | ICD-10-CM

## 2023-08-03 DIAGNOSIS — D464 Refractory anemia, unspecified: Secondary | ICD-10-CM | POA: Diagnosis not present

## 2023-08-03 DIAGNOSIS — Z79899 Other long term (current) drug therapy: Secondary | ICD-10-CM | POA: Diagnosis not present

## 2023-08-03 LAB — CBC WITH DIFFERENTIAL (CANCER CENTER ONLY)
Abs Immature Granulocytes: 0.05 10*3/uL (ref 0.00–0.07)
Basophils Absolute: 0.1 10*3/uL (ref 0.0–0.1)
Basophils Relative: 2 %
Eosinophils Absolute: 0.3 10*3/uL (ref 0.0–0.5)
Eosinophils Relative: 10 %
HCT: 19.1 % — ABNORMAL LOW (ref 39.0–52.0)
Hemoglobin: 6.3 g/dL — CL (ref 13.0–17.0)
Immature Granulocytes: 2 %
Lymphocytes Relative: 17 %
Lymphs Abs: 0.5 10*3/uL — ABNORMAL LOW (ref 0.7–4.0)
MCH: 28.4 pg (ref 26.0–34.0)
MCHC: 33 g/dL (ref 30.0–36.0)
MCV: 86 fL (ref 80.0–100.0)
Monocytes Absolute: 0.3 10*3/uL (ref 0.1–1.0)
Monocytes Relative: 11 %
Neutro Abs: 1.7 10*3/uL (ref 1.7–7.7)
Neutrophils Relative %: 58 %
Platelet Count: 35 10*3/uL — ABNORMAL LOW (ref 150–400)
RBC: 2.22 MIL/uL — ABNORMAL LOW (ref 4.22–5.81)
RDW: 14.4 % (ref 11.5–15.5)
WBC Count: 3 10*3/uL — ABNORMAL LOW (ref 4.0–10.5)
nRBC: 0 % (ref 0.0–0.2)

## 2023-08-03 LAB — SAMPLE TO BLOOD BANK

## 2023-08-03 LAB — PREPARE RBC (CROSSMATCH)

## 2023-08-03 MED ORDER — SODIUM CHLORIDE 0.9% FLUSH
10.0000 mL | Freq: Once | INTRAVENOUS | Status: AC
  Administered 2023-08-03: 10 mL

## 2023-08-03 MED ORDER — HEPARIN SOD (PORK) LOCK FLUSH 100 UNIT/ML IV SOLN
500.0000 [IU] | Freq: Once | INTRAVENOUS | Status: AC
  Administered 2023-08-03: 500 [IU]

## 2023-08-04 ENCOUNTER — Inpatient Hospital Stay: Payer: Medicare Other

## 2023-08-04 VITALS — BP 109/61 | HR 61 | Temp 97.8°F | Resp 17

## 2023-08-04 DIAGNOSIS — D469 Myelodysplastic syndrome, unspecified: Secondary | ICD-10-CM

## 2023-08-04 DIAGNOSIS — D464 Refractory anemia, unspecified: Secondary | ICD-10-CM | POA: Diagnosis not present

## 2023-08-04 DIAGNOSIS — Z79899 Other long term (current) drug therapy: Secondary | ICD-10-CM | POA: Diagnosis not present

## 2023-08-04 MED ORDER — ACETAMINOPHEN 325 MG PO TABS
650.0000 mg | ORAL_TABLET | Freq: Once | ORAL | Status: AC
  Administered 2023-08-04: 650 mg via ORAL
  Filled 2023-08-04: qty 2

## 2023-08-04 MED ORDER — SODIUM CHLORIDE 0.9% FLUSH
10.0000 mL | Freq: Once | INTRAVENOUS | Status: AC
  Administered 2023-08-04: 10 mL

## 2023-08-04 MED ORDER — HEPARIN SOD (PORK) LOCK FLUSH 100 UNIT/ML IV SOLN
500.0000 [IU] | Freq: Once | INTRAVENOUS | Status: AC
  Administered 2023-08-04: 500 [IU]

## 2023-08-04 MED ORDER — SODIUM CHLORIDE 0.9% IV SOLUTION
250.0000 mL | Freq: Once | INTRAVENOUS | Status: AC
  Administered 2023-08-04: 100 mL via INTRAVENOUS

## 2023-08-04 MED ORDER — METHYLPREDNISOLONE SODIUM SUCC 40 MG IJ SOLR
40.0000 mg | Freq: Once | INTRAMUSCULAR | Status: AC
  Administered 2023-08-04: 40 mg via INTRAVENOUS
  Filled 2023-08-04: qty 1

## 2023-08-05 ENCOUNTER — Other Ambulatory Visit: Payer: Self-pay

## 2023-08-05 ENCOUNTER — Other Ambulatory Visit: Payer: Medicare Other

## 2023-08-05 DIAGNOSIS — D469 Myelodysplastic syndrome, unspecified: Secondary | ICD-10-CM

## 2023-08-05 LAB — TYPE AND SCREEN
ABO/RH(D): A POS
Antibody Screen: NEGATIVE
Unit division: 0
Unit division: 0

## 2023-08-05 LAB — BPAM RBC
Blood Product Expiration Date: 202411102359
Blood Product Expiration Date: 202411102359
ISSUE DATE / TIME: 202410170844
ISSUE DATE / TIME: 202410170844
Unit Type and Rh: 6200
Unit Type and Rh: 6200

## 2023-08-10 ENCOUNTER — Inpatient Hospital Stay: Payer: Medicare Other

## 2023-08-11 ENCOUNTER — Inpatient Hospital Stay: Payer: Medicare Other

## 2023-08-11 ENCOUNTER — Other Ambulatory Visit: Payer: Self-pay

## 2023-08-11 DIAGNOSIS — D469 Myelodysplastic syndrome, unspecified: Secondary | ICD-10-CM

## 2023-08-11 DIAGNOSIS — Z79899 Other long term (current) drug therapy: Secondary | ICD-10-CM | POA: Diagnosis not present

## 2023-08-11 DIAGNOSIS — D464 Refractory anemia, unspecified: Secondary | ICD-10-CM | POA: Diagnosis not present

## 2023-08-11 LAB — CBC WITH DIFFERENTIAL (CANCER CENTER ONLY)
Abs Immature Granulocytes: 0.06 10*3/uL (ref 0.00–0.07)
Basophils Absolute: 0.1 10*3/uL (ref 0.0–0.1)
Basophils Relative: 2 %
Eosinophils Absolute: 0.3 10*3/uL (ref 0.0–0.5)
Eosinophils Relative: 12 %
HCT: 22.1 % — ABNORMAL LOW (ref 39.0–52.0)
Hemoglobin: 7.3 g/dL — ABNORMAL LOW (ref 13.0–17.0)
Immature Granulocytes: 2 %
Lymphocytes Relative: 17 %
Lymphs Abs: 0.5 10*3/uL — ABNORMAL LOW (ref 0.7–4.0)
MCH: 28.6 pg (ref 26.0–34.0)
MCHC: 33 g/dL (ref 30.0–36.0)
MCV: 86.7 fL (ref 80.0–100.0)
Monocytes Absolute: 0.2 10*3/uL (ref 0.1–1.0)
Monocytes Relative: 8 %
Neutro Abs: 1.7 10*3/uL (ref 1.7–7.7)
Neutrophils Relative %: 59 %
Platelet Count: 37 10*3/uL — ABNORMAL LOW (ref 150–400)
RBC: 2.55 MIL/uL — ABNORMAL LOW (ref 4.22–5.81)
RDW: 13.7 % (ref 11.5–15.5)
WBC Count: 2.8 10*3/uL — ABNORMAL LOW (ref 4.0–10.5)
nRBC: 0 % (ref 0.0–0.2)

## 2023-08-11 LAB — SAMPLE TO BLOOD BANK

## 2023-08-11 LAB — CMP (CANCER CENTER ONLY)
ALT: 42 U/L (ref 0–44)
AST: 18 U/L (ref 15–41)
Albumin: 4.1 g/dL (ref 3.5–5.0)
Alkaline Phosphatase: 40 U/L (ref 38–126)
Anion gap: 6 (ref 5–15)
BUN: 23 mg/dL (ref 8–23)
CO2: 28 mmol/L (ref 22–32)
Calcium: 9 mg/dL (ref 8.9–10.3)
Chloride: 105 mmol/L (ref 98–111)
Creatinine: 1.01 mg/dL (ref 0.61–1.24)
GFR, Estimated: >60 mL/min (ref >60)
Glucose, Bld: 204 mg/dL — ABNORMAL HIGH (ref 70–99)
Potassium: 4 mmol/L (ref 3.5–5.1)
Sodium: 139 mmol/L (ref 135–145)
Total Bilirubin: 1 mg/dL (ref 0.3–1.2)
Total Protein: 6.1 g/dL — ABNORMAL LOW (ref 6.5–8.1)

## 2023-08-11 LAB — PREPARE RBC (CROSSMATCH)

## 2023-08-11 MED ORDER — METHYLPREDNISOLONE SODIUM SUCC 40 MG IJ SOLR
40.0000 mg | Freq: Once | INTRAMUSCULAR | Status: AC
  Administered 2023-08-11: 40 mg via INTRAVENOUS
  Filled 2023-08-11: qty 1

## 2023-08-11 MED ORDER — HEPARIN SOD (PORK) LOCK FLUSH 100 UNIT/ML IV SOLN
250.0000 [IU] | INTRAVENOUS | Status: AC | PRN
  Administered 2023-08-11: 500 [IU]

## 2023-08-11 MED ORDER — SODIUM CHLORIDE 0.9% FLUSH
10.0000 mL | Freq: Once | INTRAVENOUS | Status: AC
Start: 2023-08-11 — End: 2023-08-11
  Administered 2023-08-11: 10 mL

## 2023-08-11 MED ORDER — SODIUM CHLORIDE 0.9% FLUSH
10.0000 mL | INTRAVENOUS | Status: AC | PRN
  Administered 2023-08-11: 10 mL

## 2023-08-11 MED ORDER — SODIUM CHLORIDE 0.9% IV SOLUTION
250.0000 mL | INTRAVENOUS | Status: DC
  Administered 2023-08-11: 100 mL via INTRAVENOUS

## 2023-08-11 MED ORDER — ACETAMINOPHEN 325 MG PO TABS
650.0000 mg | ORAL_TABLET | Freq: Once | ORAL | Status: AC
  Administered 2023-08-11: 650 mg via ORAL
  Filled 2023-08-11: qty 2

## 2023-08-11 NOTE — Patient Instructions (Signed)
Blood Transfusion, Adult A blood transfusion is a procedure in which you receive blood or a type of blood cell (blood component) through an IV. You may need a blood transfusion when you have a low blood count, which is a low number of any blood cell. This may result from a bleeding disorder, illness, injury, or surgery. The blood may come from a donor, or you may be able to have your own blood collected and stored (autologous blood donation) before a planned surgery. The blood given in a transfusion may be made up of different blood components. You may receive: Red blood cells. These carry oxygen to the cells in the body. Platelets. These help your blood to clot. Plasma. This is the liquid part of your blood. It carries proteins and other substances throughout the body. White blood cells. These help you fight infections. If you have hemophilia or another clotting disorder, you may also receive other types of blood products. Depending on the type of blood product, this procedure may take 1-4 hours to complete. Tell a health care provider about: Any bleeding problems you have. Any previous reactions you have had during a blood transfusion. Any allergies you have. All medicines you are taking, including vitamins, herbs, eye drops, creams, and over-the-counter medicines. Any surgeries you have had. Any medical conditions you have. Whether you are pregnant or may be pregnant. What are the risks? Talk with your health care provider about risks. The most common problems include: A mild allergic reaction, such as red, swollen areas of skin (hives) and itching. Fever or chills. This may be the body's response to new blood cells received. This may occur during or up to 4 hours after the transfusion. More serious problems may include: A serious allergic reaction that causes difficulty breathing or swelling around the face and lips. Transfusion-associated circulatory overload (TACO), or too much fluid in  the lungs. This may cause breathing problems. Transfusion-related acute lung injury (TRALI), which causes breathing difficulty and low oxygen in the blood. This can occur within hours of the transfusion or several days later. Iron overload. This can happen after receiving many blood transfusions over a period of time. Infection or virus being transmitted. This is rare because donated blood is carefully tested before it is given. Hemolytic transfusion reaction. This is rare. It happens when the body's defense system (immune system)tries to attack the new blood cells. Symptoms may include fever, chills, nausea, low blood pressure, and low back or chest pain. Transfusion-associated graft-versus-host disease (TAGVHD). This is rare. It happens when donated cells attack the body's healthy tissues. What happens before the procedure? You will have a blood test to check your blood type. This test is done to know what kind of blood your body will accept and to match it to the donor blood. If you are going to have a planned surgery, you may be able to do an autologous blood donation. This may be done in case you need to have a transfusion. You will have your temperature, blood pressure, and pulse checked before the transfusion. If you have had an allergic reaction to a transfusion in the past, you may be given medicine to help prevent a reaction. This medicine may be given to you by mouth (orally) or through an IV. What happens during the procedure?  An IV will be inserted into one of your veins. The bag of blood will be attached to your IV. The blood will then enter through your vein. Your temperature, blood pressure,   and pulse will be monitored during the transfusion. This monitoring is done to detect early signs of a transfusion reaction. Tell your nurse right away if you have any of these symptoms during the transfusion: Shortness of breath or trouble breathing. Chest or back pain. Fever or  chills. Itching or hives. If you have any signs or symptoms of a reaction, your transfusion will be stopped and you may be given medicine. When the transfusion is complete, your IV will be removed. Pressure may be applied to the IV site for a few minutes. A bandage (dressing)will be applied. The procedure may vary among health care providers and hospitals. What happens after the procedure? Your temperature, blood pressure, pulse, breathing rate, and blood oxygen level will be monitored until you leave the hospital or clinic. Your blood may be tested to see how you have responded to the transfusion. You may be warmed with fluids or blankets to maintain a normal body temperature. If you receive your blood transfusion in an outpatient setting, you will be told whom to contact to report any reactions. Where to find more information Visit the [employer]: redcross.org Summary A blood transfusion is a procedure in which you receive blood or a type of blood cell (blood component) through an IV. The blood given in a transfusion may be made up of different blood components. You may receive red blood cells, platelets, plasma, or white blood cells depending on the condition treated. Your temperature, blood pressure, and pulse will be monitored before, during, and after the transfusion. After the transfusion, your blood may be tested to see how your body has responded. This information is not intended to replace advice given to you by your health care provider. Make sure you discuss any questions you have with your health care provider. Document Revised: 01/01/2022 Document Reviewed: 01/01/2022 Elsevier Patient Education  2024 [employer].  

## 2023-08-12 ENCOUNTER — Other Ambulatory Visit: Payer: Medicare Other

## 2023-08-12 ENCOUNTER — Inpatient Hospital Stay: Payer: Medicare Other | Admitting: Hematology

## 2023-08-12 LAB — TYPE AND SCREEN
ABO/RH(D): A POS
Antibody Screen: NEGATIVE
Unit division: 0

## 2023-08-12 LAB — BPAM RBC
Blood Product Expiration Date: 202411112359
ISSUE DATE / TIME: 202410241050
Unit Type and Rh: 6200

## 2023-08-12 NOTE — Progress Notes (Incomplete)
HEMATOLOGY/ONCOLOGY PROGRESS NOTE:   Date of Service: 08/12/23  Patient Care Team: Jackelyn Poling, DO as PCP - General (Family Medicine)  CHIEF COMPLAINTS:  Follow-up for continued evaluation and management of JAK2 positive myeloproliferative neoplasm/MDS  INTERVAL HISTORY:  [name] is a 77 y.o. male here for continued evaluation and management of his MPN/MDS with refractory anemia and thrombocytosis. Patient was last seen by me on 07/14/2023 and reported mild diarrhea, arm bruising, mild leg swelling, and discomfort over the spleen after eating with sense of fullness.   Today,  -Discussed lab results on 08/12/23 in detail with patient. CBC showed WBC of ***K, hemoglobin of ***, and platelets of ***K. -    Patient is planning to travel to the beach this weekend and will return on Wednesday of next week. We will plan to start Pacritinib  once the patient returns from vacation.     MEDICAL HISTORY:  Past Medical History:  Diagnosis Date   Arthritis of right knee    Cervicalgia    Chest discomfort    normal stress echo   Chest pain    Colon polyps    Dyslipidemia    Fluttering sensation of heart    GERD without esophagitis    Hematochezia    Hyperglycemia    Hyperlipidemia    Internal hemorrhoids    Kidney stones    Male erectile dysfunction, unspecified    Mixed dyslipidemia    Mixed hyperlipidemia    Ocular migraine    PAC (premature atrial contraction)    Personal history of colonic polyps    Prediabetes    Residual hemorrhoidal skin tags    Spleen enlarged    nov 2023 hospitalized   Unspecified hemorrhoids     SURGICAL HISTORY: Past Surgical History:  Procedure Laterality Date   BONE MARROW BIOPSY     IR IMAGING GUIDED PORT INSERTION  09/14/2022   KIDNEY STONE SURGERY     retrieval    SOCIAL HISTORY: Social History   Socioeconomic History   Marital status: Married    Spouse name: Not on file   Number of children: Not on file   Years  of education: Not on file   Highest education level: Not on file  Occupational History   Not on file  Tobacco Use   Smoking status: Former    Types: Cigarettes   Smokeless tobacco: Never  Substance and Sexual Activity   Alcohol use: Yes    Alcohol/week: 1.0 standard drink of alcohol    Types: 1 Cans of beer per week   Drug use: Not on file   Sexual activity: Not on file  Other Topics Concern   Not on file  Social History Narrative   Not on file   Social Determinants of Health   Financial Resource Strain: Not on file  Food Insecurity: Unknown (08/31/2022)   Hunger Vital Sign    Worried About Running Out of Food in the Last Year: Never true    Ran Out of Food in the Last Year: Not on file  Transportation Needs: No Transportation Needs (08/31/2022)   PRAPARE - Administrator, Civil Service (Medical): No    Lack of Transportation (Non-Medical): No  Physical Activity: Not on file  Stress: Not on file  Social Connections: Not on file  Intimate Partner Violence: Not on file    FAMILY HISTORY: Family History  Problem Relation Age of Onset   Stroke Brother    Congestive Heart  Failure Brother     ALLERGIES:  has No Known Allergies.  MEDICATIONS:  Current Outpatient Medications  Medication Sig Dispense Refill   ALFUZOSIN HCL ER PO Take 1 tablet by mouth daily after supper.     aspirin EC 81 MG tablet Take 81 mg by mouth daily. Swallow whole.     B Complex Vitamins (B COMPLEX PO) Take 1 tablet by mouth daily.     Cholecalciferol (VITAMIN D3) 50 MCG (2000 UT) TABS Take 2,000 Units by mouth daily.     docusate sodium (COLACE) 100 MG capsule Take 100 mg by mouth 2 (two) times daily.     doxycycline (VIBRAMYCIN) 100 MG capsule Take 1 capsule (100 mg total) by mouth 2 (two) times daily. 20 capsule 0   FIBER PO Take 1 capsule by mouth daily. Unknown strength     fish oil-omega-3 fatty acids 1000 MG capsule Take 1 g by mouth 2 (two) times daily. Strength 300mg /1500mg       Flaxseed, Linseed, (FLAXSEED OIL) 1000 MG CAPS Take 1,000 mg by mouth daily.     furosemide (LASIX) 20 MG tablet Take 1 tablet by mouth once daily 30 tablet 0   GLUCOSAMINE CHONDROITIN MSM PO Take 1 tablet by mouth daily. Strength 1500/1500     Misc Natural Products (PROSTATE SUPPORT PO) Take 1 capsule by mouth daily. Unknown strength     Multiple Vitamin (MULTIVITAMIN) tablet Take 1 tablet by mouth daily. Unknown strength     pacritinib citrate (VONJO) 100 MG capsule Take 1 capsule (100 mg total) by mouth 2 (two) times daily. 60 capsule 1   potassium chloride SA (KLOR-CON M) 20 MEQ tablet Take 1 tablet (20 mEq total) by mouth 2 (two) times daily. Take while on lasix 60 tablet 0   psyllium (METAMUCIL) 58.6 % powder Take 1 packet by mouth daily.     senna-docusate (SENNA S) 8.6-50 MG tablet Take 2 tablets by mouth at bedtime. 60 tablet 1   traMADol (ULTRAM) 50 MG tablet Take 50 mg by mouth as needed for moderate pain or severe pain.     Turmeric (QC TUMERIC COMPLEX PO) Take 750 mg by mouth daily.     vitamin C (ASCORBIC ACID) 250 MG tablet Take 500 mg by mouth daily.     No current facility-administered medications for this visit.    REVIEW OF SYSTEMS:    10 Point review of Systems was done is negative except as noted above.   PHYSICAL EXAMINATION:  GENERAL:alert, in no acute distress and comfortable SKIN: no acute rashes, no significant lesions EYES: conjunctiva are pink and non-injected, sclera anicteric OROPHARYNX: MMM, no exudates, no oropharyngeal erythema or ulceration NECK: supple, no JVD LYMPH:  no palpable lymphadenopathy in the cervical, axillary or inguinal regions LUNGS: clear to auscultation b/l with normal respiratory effort HEART: regular rate & rhythm ABDOMEN:  normoactive bowel sounds , non tender, not distended. Extremity: no pedal edema PSYCH: alert & oriented x 3 with fluent speech NEURO: no focal motor/sensory deficits   LABORATORY DATA:  I have reviewed the  data as listed .    Latest Ref Rng & Units 08/11/2023    7:38 AM 08/03/2023    3:41 PM 07/27/2023    1:06 PM  CBC  WBC 4.0 - 10.5 K/uL 2.8  3.0  3.7   Hemoglobin 13.0 - 17.0 g/dL 7.3  6.3  7.0   Hematocrit 39.0 - 52.0 % 22.1  19.1  21.3   Platelets 150 - 400 K/uL  37  35  41      Iron/TIBC/Ferritin/ %Sat    Component Value Date/Time   IRON 180 09/30/2022 0924   TIBC 193 (L) 09/30/2022 0924   FERRITIN 1,450 (H) 09/30/2022 0924   IRONPCTSAT 93 (H) 09/30/2022 0924      Latest Ref Rng & Units 08/11/2023    7:38 AM 07/27/2023    1:06 PM 07/14/2023    8:33 AM  CMP  Glucose 70 - 99 mg/dL 409  811  914   BUN 8 - 23 mg/dL 23  21  22    Creatinine 0.61 - 1.24 mg/dL 7.82  9.56  2.13   Sodium 135 - 145 mmol/L 139  139  138   Potassium 3.5 - 5.1 mmol/L 4.0  4.2  4.1   Chloride 98 - 111 mmol/L 105  105  106   CO2 22 - 32 mmol/L 28  29  27    Calcium 8.9 - 10.3 mg/dL 9.0  8.9  8.6   Total Protein 6.5 - 8.1 g/dL 6.1  6.1  6.0   Total Bilirubin 0.3 - 1.2 mg/dL 1.0  0.8  0.8   Alkaline Phos 38 - 126 U/L 40  41  44   AST 15 - 41 U/L 18  22  25    ALT 0 - 44 U/L 42  60  62     Lab Results  Component Value Date   LDH 110 01/04/2022    02/23/2021 BCR ABL    02/23/2021 JAK2   Surgical Pathology  CASE: [id number]  PATIENT: Fernando Lee  Bone Marrow Report  Clinical History: MPN with progressive anemia  (BH)  DIAGNOSIS:   BONE MARROW, ASPIRATE, CLOT, CORE:  -Hypercellular bone marrow with features of myeloid neoplasm  -See comment   PERIPHERAL BLOOD:  -Macrocytic anemia  -Leukocytosis  -Thrombocytosis   COMMENT:   The bone marrow/peripheral blood show persistent involvement by  previously known myeloid neoplasm.  There is a myeloproliferative  component as supported by previous JAK2 positivity.  However, there are  also dyspoietic changes primarily involving the megakaryocytic cell line  with numerous hypolobated/unilobated forms in addition to  dysgranulopoiesis to a  lesser extent.  This is associated with  eosinophilia.  It is not entirely clear whether the overall findings  represent a myeloproliferative neoplasm with treatment related changes  or represent a primary myeloproliferative/myelodysplastic neoplasm  including but not limited to myeloid neoplasms with eosinophilia.  Correlation with cytogenetic and FISH studies strongly recommended.      RADIOGRAPHIC STUDIES: I have personally reviewed the radiological images as listed and agreed with the findings in the report. No results found.   IR IMAGING GUIDED PORT INSERTION  Result Date: 09/14/2022 INDICATION: Poor IV access.  Myelodysplastic syndrome EXAM: IMPLANTED PORT A CATH PLACEMENT WITH ULTRASOUND AND FLUOROSCOPIC GUIDANCE MEDICATIONS: None ANESTHESIA/SEDATION: Moderate (conscious) sedation was employed during this procedure. A total of Versed 3 mg and Fentanyl 100 mcg was administered intravenously. Moderate Sedation Time: 20 minutes. The patient's level of consciousness and vital signs were monitored continuously by radiology nursing throughout the procedure under my direct supervision. FLUOROSCOPY TIME:  Fluoroscopic dose; 1 mGy COMPLICATIONS: None immediate. PROCEDURE: The procedure, risks, benefits, and alternatives were explained to the patient. Questions regarding the procedure were encouraged and answered. The patient understands and consents to the procedure. The RIGHT neck and chest were prepped with chlorhexidine in a sterile fashion, and a sterile drape was applied covering the operative field. Maximum barrier sterile technique with sterile  gowns and gloves were used for the procedure. A timeout was performed prior to the initiation of the procedure. Local anesthesia was provided with 1% lidocaine with epinephrine. After creating a small venotomy incision, a micropuncture kit was utilized to access the internal jugular vein under direct, real-time ultrasound guidance. Ultrasound image  documentation was performed. The microwire was kinked to measure appropriate catheter length. A subcutaneous port pocket was then created along the upper chest wall utilizing a combination of sharp and blunt dissection. The pocket was irrigated with sterile saline. A single lumen ISP power injectable port was chosen for placement. The 8 Fr catheter was tunneled from the port pocket site to the venotomy incision. The port was placed in the pocket. The external catheter was trimmed to appropriate length. At the venotomy, an 8 Fr peel-away sheath was placed over a guidewire under fluoroscopic guidance. The catheter was then placed through the sheath and the sheath was removed. Final catheter positioning was confirmed and documented with a fluoroscopic spot radiograph. The port was accessed with a Huber needle, aspirated and flushed with heparinized saline. The port pocket incision was closed with interrupted 3-0 Vicryl suture then Dermabond was applied, including at the venotomy incision. Dressings were placed. The patient tolerated the procedure well without immediate post procedural complication. IMPRESSION: Successful placement of a RIGHT internal jugular approach power injectable Port-A-Cath. The tip of the catheter is positioned within the proximal RIGHT atrium. The catheter is ready for immediate use. Roanna Banning, MD [facility] Radiology Electronically Signed   By: Roanna Banning M.D.   On: 09/14/2022 17:18   CT ANGIO GI BLEED  Result Date: 08/31/2022 CLINICAL DATA:  Acute mesenteric ischemia. Abdominal pain. History of myelodysplastic syndrome. EXAM: CTA ABDOMEN AND PELVIS WITHOUT AND WITH CONTRAST TECHNIQUE: Multidetector CT imaging of the abdomen and pelvis was performed using the standard protocol during bolus administration of intravenous contrast. Multiplanar reconstructed images and MIPs were obtained and reviewed to evaluate the vascular anatomy.  RADIATION DOSE REDUCTION: This exam was performed according to the departmental dose-optimization program which includes automated exposure control, adjustment of the mA and/or kV according to patient size and/or use of iterative reconstruction technique. CONTRAST:  OMNIPAQUE IOHEXOL 350 MG/ML SOLN COMPARISON:  Abdominal ultrasound examination 04/03/2021 FINDINGS: VASCULAR Aorta: Normal caliber abdominal aorta. Scattered atherosclerotic calcifications. No dissection. Celiac: Normal SMA: Normal Renals: Normal IMA: Patent Inflow: Normal Proximal Outflow: Normal Veins: Normal Review of the MIP images confirms the above findings. NON-VASCULAR Lower chest: Small left pleural effusion and bibasilar atelectasis. The heart is normal in size. No pericardial effusion. There is a moderate to large hiatal hernia. Hepatobiliary: No hepatic lesions or intrahepatic biliary dilatation. The gallbladder is unremarkable. No common bile duct dilatation. Pancreas: No mass, inflammation or ductal dilatation. Spleen: Massive splenomegaly. The spleen measures 20 x 16 x 11 cm. No splenic lesions or splenic infarct. Adrenals/Urinary Tract: The left kidney is displaced medially and inferiorly by the enlarged spleen. No worrisome renal lesions,, hydronephrosis or pyelonephritis. There is a lower pole right renal calculus and there are 2 distal left ureteral calculi more proximal calculus measures 4 mm and the more distal calculus measures 5 mm. I do not see any hydroureter or hydronephrosis. Stomach/Bowel: The stomach, duodenum, small bowel and colon are grossly normal. No inflammatory changes, mass lesions or obstructive findings. Lymphatic: No abdominal or pelvic lymphadenopathy. Reproductive: The prostate gland is enlarged. The seminal vesicles are unremarkable. Other: No pelvic mass or adenopathy. No free  pelvic fluid collections. No inguinal mass or adenopathy. No abdominal wall hernia or subcutaneous lesions. Musculoskeletal: No  significant bony findings. IMPRESSION: 1. Normal caliber abdominal aorta and no dissection. The branch vessels are normal. 2. Splenomegaly. 3. Two distal left ureteral calculi but no hydroureter or hydronephrosis. 4. Lower pole right renal calculus. 5. Moderate to large hiatal hernia. 6. Small left pleural effusion and bibasilar atelectasis. Electronically Signed   By: Rudie Meyer M.D.   On: 08/31/2022 18:13     ASSESSMENT & PLAN:   77 y.o. male with   #1 JAK2-positive myeloproliferative neoplasm-primarily presenting with thrombocytosis and associated MDS causing refractory anemia -No polycythemia.  -Mild leukocytosis.  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 Has grade 1 out of 3 reticulin fibrosis.  Uncertain if this is primary or secondary. -LDH has remained within normal limits. 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 -CT Angio GI bleed scan from 09/01/2022 showed kidney stones.and significant splenomegaly  PLAN:  -patient has been off of Revlimid for 4 weeks now -Discussed lab results on 07/14/23 in detail with patient. CBC showed WBC of 5.5K, hemoglobin of 7.3, and platelets of 83K. -platelets show mild fluctuation, which is not concerning at this time -CMP shows that potassium level is normal at 4.1 mmol/L. Okay to take a half tablet of Lasix once a day.  -hold aspirin at this time -would recommend trying Pacritinib to shrink the spleen given patient's JAK2 mutation. Pacritinib  would be the least likely to suppress the bone marrow -Patient will be going on vacation and will return on October 2nd. We shall plan to start Pacritinib once the patient returns from vacation.  -Will start patient on 100 MG once a day to see how he tolerates it for the first week once he returns from vacation. He can then take 100 MG twice a day if he is able to tolerate. -patient shall return to clinic 2 weeks after starting Pacritinib  -would recommend patient  to stay UTD with age-appropriate vaccinations including flu shot.  -discussed that if there is some fatigue related to cytokine release, Pacritinib might help symptoms -discussed option for patient to receive an additional unit of PRBCs today prior to his vacation. Patient would only like one unit at this time. Will proceed with 1 unit of PRBCs today -proceed with EKG today to evaluate baseline -- normal Qtc -do not anticipate abnormal QT intervals -answered all of patient's and his wife's questions in detail  -discussed details of potential risks with different range levels of platelets -patient shall return for labs and to receive a transfusion on Thursday of next week, 07/21/2023.  -there may be a role for clinical trials for overlap syndrome   Follow-up: ***  The total time spent in the appointment was *** minutes* .  All of the patient's questions were answered with apparent satisfaction. The patient knows to call the clinic with any problems, questions or concerns.   Wyvonnia Lora MD MS AAHIVMS [facility] Hematology/Oncology Physician [facility]  .*Total Encounter Time as defined by the Centers for Medicare and Medicaid Services includes, in addition to the face-to-face time of a patient visit (documented in the note above) non-face-to-face time: obtaining and reviewing outside history, ordering and reviewing medications, tests or procedures, care coordination (communications with other health care professionals or caregivers) and documentation in the medical record.    I,Mitra Faeizi,acting as a Neurosurgeon for Wyvonnia Lora, MD.,have documented all relevant documentation on the behalf of Gautam  Candise Che, MD,as directed by  Wyvonnia Lora, MD while in the presence of Wyvonnia Lora, MD.  ***

## 2023-08-17 ENCOUNTER — Other Ambulatory Visit (HOSPITAL_COMMUNITY): Payer: Self-pay

## 2023-08-17 ENCOUNTER — Other Ambulatory Visit: Payer: Self-pay

## 2023-08-17 ENCOUNTER — Inpatient Hospital Stay: Payer: Medicare Other

## 2023-08-17 DIAGNOSIS — Z79899 Other long term (current) drug therapy: Secondary | ICD-10-CM | POA: Diagnosis not present

## 2023-08-17 DIAGNOSIS — D469 Myelodysplastic syndrome, unspecified: Secondary | ICD-10-CM

## 2023-08-17 DIAGNOSIS — D464 Refractory anemia, unspecified: Secondary | ICD-10-CM | POA: Diagnosis not present

## 2023-08-17 LAB — PREPARE RBC (CROSSMATCH)

## 2023-08-17 LAB — CBC WITH DIFFERENTIAL (CANCER CENTER ONLY)
Abs Immature Granulocytes: 0.03 10*3/uL (ref 0.00–0.07)
Basophils Absolute: 0.1 10*3/uL (ref 0.0–0.1)
Basophils Relative: 3 %
Eosinophils Absolute: 0.3 10*3/uL (ref 0.0–0.5)
Eosinophils Relative: 9 %
HCT: 21.8 % — ABNORMAL LOW (ref 39.0–52.0)
Hemoglobin: 7.3 g/dL — ABNORMAL LOW (ref 13.0–17.0)
Immature Granulocytes: 1 %
Lymphocytes Relative: 18 %
Lymphs Abs: 0.6 10*3/uL — ABNORMAL LOW (ref 0.7–4.0)
MCH: 29 pg (ref 26.0–34.0)
MCHC: 33.5 g/dL (ref 30.0–36.0)
MCV: 86.5 fL (ref 80.0–100.0)
Monocytes Absolute: 0.3 10*3/uL (ref 0.1–1.0)
Monocytes Relative: 10 %
Neutro Abs: 2 10*3/uL (ref 1.7–7.7)
Neutrophils Relative %: 59 %
Platelet Count: 46 10*3/uL — ABNORMAL LOW (ref 150–400)
RBC: 2.52 MIL/uL — ABNORMAL LOW (ref 4.22–5.81)
RDW: 13.5 % (ref 11.5–15.5)
WBC Count: 3.3 10*3/uL — ABNORMAL LOW (ref 4.0–10.5)
nRBC: 0 % (ref 0.0–0.2)

## 2023-08-17 LAB — CMP (CANCER CENTER ONLY)
ALT: 44 U/L (ref 0–44)
AST: 17 U/L (ref 15–41)
Albumin: 4.2 g/dL (ref 3.5–5.0)
Alkaline Phosphatase: 40 U/L (ref 38–126)
Anion gap: 6 (ref 5–15)
BUN: 22 mg/dL (ref 8–23)
CO2: 29 mmol/L (ref 22–32)
Calcium: 9 mg/dL (ref 8.9–10.3)
Chloride: 103 mmol/L (ref 98–111)
Creatinine: 0.99 mg/dL (ref 0.61–1.24)
GFR, Estimated: >60 mL/min (ref >60)
Glucose, Bld: 144 mg/dL — ABNORMAL HIGH (ref 70–99)
Potassium: 4.1 mmol/L (ref 3.5–5.1)
Sodium: 138 mmol/L (ref 135–145)
Total Bilirubin: 0.7 mg/dL (ref 0.3–1.2)
Total Protein: 6.2 g/dL — ABNORMAL LOW (ref 6.5–8.1)

## 2023-08-17 LAB — SAMPLE TO BLOOD BANK

## 2023-08-17 MED ORDER — SODIUM CHLORIDE 0.9% FLUSH
10.0000 mL | Freq: Once | INTRAVENOUS | Status: AC
  Administered 2023-08-17: 10 mL

## 2023-08-17 MED ORDER — HEPARIN SOD (PORK) LOCK FLUSH 100 UNIT/ML IV SOLN
500.0000 [IU] | Freq: Once | INTRAVENOUS | Status: AC
Start: 2023-08-17 — End: 2023-08-17
  Administered 2023-08-17: 500 [IU]

## 2023-08-18 ENCOUNTER — Inpatient Hospital Stay: Payer: Medicare Other

## 2023-08-18 ENCOUNTER — Other Ambulatory Visit: Payer: Self-pay

## 2023-08-18 DIAGNOSIS — D464 Refractory anemia, unspecified: Secondary | ICD-10-CM | POA: Diagnosis not present

## 2023-08-18 DIAGNOSIS — Z79899 Other long term (current) drug therapy: Secondary | ICD-10-CM | POA: Diagnosis not present

## 2023-08-18 DIAGNOSIS — D469 Myelodysplastic syndrome, unspecified: Secondary | ICD-10-CM

## 2023-08-18 MED ORDER — HEPARIN SOD (PORK) LOCK FLUSH 100 UNIT/ML IV SOLN
500.0000 [IU] | Freq: Every day | INTRAVENOUS | Status: AC | PRN
  Administered 2023-08-18: 500 [IU]

## 2023-08-18 MED ORDER — SODIUM CHLORIDE 0.9% FLUSH
10.0000 mL | INTRAVENOUS | Status: AC | PRN
  Administered 2023-08-18: 10 mL

## 2023-08-18 MED ORDER — SODIUM CHLORIDE 0.9% IV SOLUTION
250.0000 mL | INTRAVENOUS | Status: DC
  Administered 2023-08-18: 100 mL via INTRAVENOUS

## 2023-08-18 MED ORDER — METHYLPREDNISOLONE SODIUM SUCC 40 MG IJ SOLR
40.0000 mg | Freq: Once | INTRAMUSCULAR | Status: AC
  Administered 2023-08-18: 40 mg via INTRAVENOUS
  Filled 2023-08-18: qty 1

## 2023-08-18 MED ORDER — ACETAMINOPHEN 325 MG PO TABS
650.0000 mg | ORAL_TABLET | Freq: Once | ORAL | Status: AC
Start: 2023-08-18 — End: 2023-08-18
  Administered 2023-08-18: 650 mg via ORAL
  Filled 2023-08-18: qty 2

## 2023-08-18 NOTE — Patient Instructions (Signed)

## 2023-08-19 ENCOUNTER — Other Ambulatory Visit: Payer: Medicare Other

## 2023-08-19 LAB — BPAM RBC
Blood Product Expiration Date: 202411272359
ISSUE DATE / TIME: 202410310929
Unit Type and Rh: 6200

## 2023-08-19 LAB — TYPE AND SCREEN
ABO/RH(D): A POS
Antibody Screen: NEGATIVE
Unit division: 0

## 2023-08-24 NOTE — Progress Notes (Signed)
HEMATOLOGY/ONCOLOGY PROGRESS NOTE:   Date of Service: 08/25/2023  Patient Care Team: Jackelyn Poling, DO as PCP - General (Family Medicine)  CHIEF COMPLAINTS:  Follow-up for continued evaluation and management of JAK2 positive myeloproliferative neoplasm/MDS  INTERVAL HISTORY:  [name] is a 77 y.o. male here for continued evaluation and management of his MPN/MDS with refractory anemia and thrombocytosis. Patient was last seen by me on 07/14/2023 and reported mild diarrhea, bruising in his arms, mild leg swelling, discomfort over the spleen after eating, and sense of abdominal fullness.   Today, he is accompanied by his wife. Patient reports that he has been feeling well overall. He reports that he has been getting more sleep recently, which has helped his energy levels in the day. Patient's sleep habits was previously limited by urinary frequency. He takes Alfuzosin, which has helped his bladder issues. Patient takes lasix in the mornings and notes urinary frequency in the mornings, which improves around 1-2 pm.   He reports that he does endorse more fatigue and SOB with exertion and activity, but as no major issues on a day to day basis. He denies any sudden fatigue.   Patient regularly takes Pacritinib 100 MG twice a day and denies any major toxicities. He denies any infection issues, nausea, diarrhea, or new headaches. He reports that his leg swelling has improved.   He denies any abdominal pain and reports that he feels like his spleen has shrunk in size. His wife reports that he has a good appetite.   Patient does report bumping his left arm against a door and subsequently developed some bruising in the area. He denies any issues with spontaneous bleeding or other bleeding/bruising issues.   MEDICAL HISTORY:  Past Medical History:  Diagnosis Date   Arthritis of right knee    Cervicalgia    Chest discomfort    normal stress echo   Chest pain    Colon polyps     Dyslipidemia    Fluttering sensation of heart    GERD without esophagitis    Hematochezia    Hyperglycemia    Hyperlipidemia    Internal hemorrhoids    Kidney stones    Male erectile dysfunction, unspecified    Mixed dyslipidemia    Mixed hyperlipidemia    Ocular migraine    PAC (premature atrial contraction)    Personal history of colonic polyps    Prediabetes    Residual hemorrhoidal skin tags    Spleen enlarged    nov 2023 hospitalized   Unspecified hemorrhoids     SURGICAL HISTORY: Past Surgical History:  Procedure Laterality Date   BONE MARROW BIOPSY     IR IMAGING GUIDED PORT INSERTION  09/14/2022   KIDNEY STONE SURGERY     retrieval    SOCIAL HISTORY: Social History   Socioeconomic History   Marital status: Married    Spouse name: Not on file   Number of children: Not on file   Years of education: Not on file   Highest education level: Not on file  Occupational History   Not on file  Tobacco Use   Smoking status: Former    Types: Cigarettes   Smokeless tobacco: Never  Substance and Sexual Activity   Alcohol use: Yes    Alcohol/week: 1.0 standard drink of alcohol    Types: 1 Cans of beer per week   Drug use: Not on file   Sexual activity: Not on file  Other Topics Concern   Not  on file  Social History Narrative   Not on file   Social Determinants of Health   Financial Resource Strain: Not on file  Food Insecurity: Unknown (08/31/2022)   Hunger Vital Sign    Worried About Running Out of Food in the Last Year: Never true    Ran Out of Food in the Last Year: Not on file  Transportation Needs: No Transportation Needs (08/31/2022)   PRAPARE - Administrator, Civil Service (Medical): No    Lack of Transportation (Non-Medical): No  Physical Activity: Not on file  Stress: Not on file  Social Connections: Not on file  Intimate Partner Violence: Not on file    FAMILY HISTORY: Family History  Problem Relation Age of Onset   Stroke  Brother    Congestive Heart Failure Brother     ALLERGIES:  has No Known Allergies.  MEDICATIONS:  Current Outpatient Medications  Medication Sig Dispense Refill   ALFUZOSIN HCL ER PO Take 1 tablet by mouth daily after supper.     aspirin EC 81 MG tablet Take 81 mg by mouth daily. Swallow whole.     B Complex Vitamins (B COMPLEX PO) Take 1 tablet by mouth daily.     Cholecalciferol (VITAMIN D3) 50 MCG (2000 UT) TABS Take 2,000 Units by mouth daily.     docusate sodium (COLACE) 100 MG capsule Take 100 mg by mouth 2 (two) times daily.     doxycycline (VIBRAMYCIN) 100 MG capsule Take 1 capsule (100 mg total) by mouth 2 (two) times daily. 20 capsule 0   FIBER PO Take 1 capsule by mouth daily. Unknown strength     fish oil-omega-3 fatty acids 1000 MG capsule Take 1 g by mouth 2 (two) times daily. Strength 300mg /1500mg      Flaxseed, Linseed, (FLAXSEED OIL) 1000 MG CAPS Take 1,000 mg by mouth daily.     furosemide (LASIX) 20 MG tablet Take 1 tablet by mouth once daily 30 tablet 0   GLUCOSAMINE CHONDROITIN MSM PO Take 1 tablet by mouth daily. Strength 1500/1500     Misc Natural Products (PROSTATE SUPPORT PO) Take 1 capsule by mouth daily. Unknown strength     Multiple Vitamin (MULTIVITAMIN) tablet Take 1 tablet by mouth daily. Unknown strength     pacritinib citrate (VONJO) 100 MG capsule Take 1 capsule (100 mg total) by mouth 2 (two) times daily. 60 capsule 1   potassium chloride SA (KLOR-CON M) 20 MEQ tablet Take 1 tablet (20 mEq total) by mouth 2 (two) times daily. Take while on lasix 60 tablet 0   psyllium (METAMUCIL) 58.6 % powder Take 1 packet by mouth daily.     senna-docusate (SENNA S) 8.6-50 MG tablet Take 2 tablets by mouth at bedtime. 60 tablet 1   traMADol (ULTRAM) 50 MG tablet Take 50 mg by mouth as needed for moderate pain or severe pain.     Turmeric (QC TUMERIC COMPLEX PO) Take 750 mg by mouth daily.     vitamin C (ASCORBIC ACID) 250 MG tablet Take 500 mg by mouth daily.     No  current facility-administered medications for this visit.   Facility-Administered Medications Ordered in Other Visits  Medication Dose Route Frequency Provider Last Rate Last Admin   0.9 %  sodium chloride infusion (Manually program via Guardrails IV Fluids)  250 mL Intravenous Continuous Johney Maine, MD 10 mL/hr at 08/25/23 0912 100 mL at 08/25/23 0912   heparin lock flush 100 unit/mL  250 Units  Intracatheter PRN Johney Maine, MD        REVIEW OF SYSTEMS:    10 Point review of Systems was done is negative except as noted above.   PHYSICAL EXAMINATION: VSS GENERAL:alert, in no acute distress and comfortable SKIN: no acute rashes, no significant lesions EYES: conjunctiva are pink and non-injected, sclera anicteric OROPHARYNX: MMM, no exudates, no oropharyngeal erythema or ulceration NECK: supple, no JVD LYMPH:  no palpable lymphadenopathy in the cervical, axillary or inguinal regions LUNGS: clear to auscultation b/l with normal respiratory effort HEART: regular rate & rhythm ABDOMEN:  normoactive bowel sounds , non tender, not distended. Extremity: no pedal edema PSYCH: alert & oriented x 3 with fluent speech NEURO: no focal motor/sensory deficits   LABORATORY DATA:  I have reviewed the data as listed .    Latest Ref Rng & Units 08/25/2023    7:59 AM 08/17/2023    3:11 PM 08/11/2023    7:38 AM  CBC  WBC 4.0 - 10.5 K/uL 3.2  3.3  2.8   Hemoglobin 13.0 - 17.0 g/dL 6.8  7.3  7.3   Hematocrit 39.0 - 52.0 % 20.8  21.8  22.1   Platelets 150 - 400 K/uL 51  46  37      Iron/TIBC/Ferritin/ %Sat    Component Value Date/Time   IRON 180 09/30/2022 0924   TIBC 193 (L) 09/30/2022 0924   FERRITIN 1,450 (H) 09/30/2022 0924   IRONPCTSAT 93 (H) 09/30/2022 0924      Latest Ref Rng & Units 08/17/2023    3:11 PM 08/11/2023    7:38 AM 07/27/2023    1:06 PM  CMP  Glucose 70 - 99 mg/dL 161  096  045   BUN 8 - 23 mg/dL 22  23  21    Creatinine 0.61 - 1.24 mg/dL 4.09   8.11  9.14   Sodium 135 - 145 mmol/L 138  139  139   Potassium 3.5 - 5.1 mmol/L 4.1  4.0  4.2   Chloride 98 - 111 mmol/L 103  105  105   CO2 22 - 32 mmol/L 29  28  29    Calcium 8.9 - 10.3 mg/dL 9.0  9.0  8.9   Total Protein 6.5 - 8.1 g/dL 6.2  6.1  6.1   Total Bilirubin 0.3 - 1.2 mg/dL 0.7  1.0  0.8   Alkaline Phos 38 - 126 U/L 40  40  41   AST 15 - 41 U/L 17  18  22    ALT 0 - 44 U/L 44  42  60     Lab Results  Component Value Date   LDH 110 01/04/2022    02/23/2021 BCR ABL    02/23/2021 JAK2   Surgical Pathology  CASE: [id number]  PATIENT: Fernando Lee  Bone Marrow Report  Clinical History: MPN with progressive anemia  (BH)  DIAGNOSIS:   BONE MARROW, ASPIRATE, CLOT, CORE:  -Hypercellular bone marrow with features of myeloid neoplasm  -See comment   PERIPHERAL BLOOD:  -Macrocytic anemia  -Leukocytosis  -Thrombocytosis   COMMENT:   The bone marrow/peripheral blood show persistent involvement by  previously known myeloid neoplasm.  There is a myeloproliferative  component as supported by previous JAK2 positivity.  However, there are  also dyspoietic changes primarily involving the megakaryocytic cell line  with numerous hypolobated/unilobated forms in addition to  dysgranulopoiesis to a lesser extent.  This is associated with  eosinophilia.  It is not entirely clear whether the overall  findings  represent a myeloproliferative neoplasm with treatment related changes  or represent a primary myeloproliferative/myelodysplastic neoplasm  including but not limited to myeloid neoplasms with eosinophilia.  Correlation with cytogenetic and FISH studies strongly recommended.      RADIOGRAPHIC STUDIES: I have personally reviewed the radiological images as listed and agreed with the findings in the report. No results found.   IR IMAGING GUIDED PORT INSERTION  Result Date: 09/14/2022 INDICATION: Poor IV access.  Myelodysplastic syndrome EXAM: IMPLANTED PORT A CATH  PLACEMENT WITH ULTRASOUND AND FLUOROSCOPIC GUIDANCE MEDICATIONS: None ANESTHESIA/SEDATION: Moderate (conscious) sedation was employed during this procedure. A total of Versed 3 mg and Fentanyl 100 mcg was administered intravenously. Moderate Sedation Time: 20 minutes. The patient's level of consciousness and vital signs were monitored continuously by radiology nursing throughout the procedure under my direct supervision. FLUOROSCOPY TIME:  Fluoroscopic dose; 1 mGy COMPLICATIONS: None immediate. PROCEDURE: The procedure, risks, benefits, and alternatives were explained to the patient. Questions regarding the procedure were encouraged and answered. The patient understands and consents to the procedure. The RIGHT neck and chest were prepped with chlorhexidine in a sterile fashion, and a sterile drape was applied covering the operative field. Maximum barrier sterile technique with sterile gowns and gloves were used for the procedure. A timeout was performed prior to the initiation of the procedure. Local anesthesia was provided with 1% lidocaine with epinephrine. After creating a small venotomy incision, a micropuncture kit was utilized to access the internal jugular vein under direct, real-time ultrasound guidance. Ultrasound image documentation was performed. The microwire was kinked to measure appropriate catheter length. A subcutaneous port pocket was then created along the upper chest wall utilizing a combination of sharp and blunt dissection. The pocket was irrigated with sterile saline. A single lumen ISP power injectable port was chosen for placement. The 8 Fr catheter was tunneled from the port pocket site to the venotomy incision. The port was placed in the pocket. The external catheter was trimmed to appropriate length. At the venotomy, an 8 Fr peel-away sheath was placed over a guidewire under fluoroscopic guidance. The catheter was then placed through the sheath and the sheath was removed. Final catheter  positioning was confirmed and documented with a fluoroscopic spot radiograph. The port was accessed with a Huber needle, aspirated and flushed with heparinized saline. The port pocket incision was closed with interrupted 3-0 Vicryl suture then Dermabond was applied, including at the venotomy incision. Dressings were placed. The patient tolerated the procedure well without immediate post procedural complication. IMPRESSION: Successful placement of a RIGHT internal jugular approach power injectable Port-A-Cath. The tip of the catheter is positioned within the proximal RIGHT atrium. The catheter is ready for immediate use. Roanna Banning, MD [facility] Radiology Electronically Signed   By: Roanna Banning M.D.   On: 09/14/2022 17:18   CT ANGIO GI BLEED  Result Date: 08/31/2022 CLINICAL DATA:  Acute mesenteric ischemia. Abdominal pain. History of myelodysplastic syndrome. EXAM: CTA ABDOMEN AND PELVIS WITHOUT AND WITH CONTRAST TECHNIQUE: Multidetector CT imaging of the abdomen and pelvis was performed using the standard protocol during bolus administration of intravenous contrast. Multiplanar reconstructed images and MIPs were obtained and reviewed to evaluate the vascular anatomy. RADIATION DOSE REDUCTION: This exam was performed according to the departmental dose-optimization program which includes automated exposure control, adjustment of the mA and/or kV according to patient size and/or use of iterative reconstruction technique. CONTRAST:  OMNIPAQUE IOHEXOL 350 MG/ML SOLN COMPARISON:  Abdominal ultrasound examination  04/03/2021 FINDINGS: VASCULAR Aorta: Normal caliber abdominal aorta. Scattered atherosclerotic calcifications. No dissection. Celiac: Normal SMA: Normal Renals: Normal IMA: Patent Inflow: Normal Proximal Outflow: Normal Veins: Normal Review of the MIP images confirms the above findings. NON-VASCULAR Lower chest: Small left pleural effusion and  bibasilar atelectasis. The heart is normal in size. No pericardial effusion. There is a moderate to large hiatal hernia. Hepatobiliary: No hepatic lesions or intrahepatic biliary dilatation. The gallbladder is unremarkable. No common bile duct dilatation. Pancreas: No mass, inflammation or ductal dilatation. Spleen: Massive splenomegaly. The spleen measures 20 x 16 x 11 cm. No splenic lesions or splenic infarct. Adrenals/Urinary Tract: The left kidney is displaced medially and inferiorly by the enlarged spleen. No worrisome renal lesions,, hydronephrosis or pyelonephritis. There is a lower pole right renal calculus and there are 2 distal left ureteral calculi more proximal calculus measures 4 mm and the more distal calculus measures 5 mm. I do not see any hydroureter or hydronephrosis. Stomach/Bowel: The stomach, duodenum, small bowel and colon are grossly normal. No inflammatory changes, mass lesions or obstructive findings. Lymphatic: No abdominal or pelvic lymphadenopathy. Reproductive: The prostate gland is enlarged. The seminal vesicles are unremarkable. Other: No pelvic mass or adenopathy. No free pelvic fluid collections. No inguinal mass or adenopathy. No abdominal wall hernia or subcutaneous lesions. Musculoskeletal: No significant bony findings. IMPRESSION: 1. Normal caliber abdominal aorta and no dissection. The branch vessels are normal. 2. Splenomegaly. 3. Two distal left ureteral calculi but no hydroureter or hydronephrosis. 4. Lower pole right renal calculus. 5. Moderate to large hiatal hernia. 6. Small left pleural effusion and bibasilar atelectasis. Electronically Signed   By: Rudie Meyer M.D.   On: 08/31/2022 18:13     ASSESSMENT & PLAN:   77 y.o. male with   #1 JAK2-positive myeloproliferative neoplasm-primarily presenting with thrombocytosis and associated MDS causing refractory anemia -No polycythemia.  -Mild leukocytosis.  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 Has grade 1 out of 3 reticulin fibrosis.  Uncertain if this is primary or secondary. -LDH has remained within normal limits. 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 -CT Angio GI bleed scan from 09/01/2022 showed kidney stones.and significant splenomegaly  PLAN:  -Discussed lab results on 08/25/2023 in detail with patient. CBC showed WBC of 3.2K, hemoglobin of 6.8, and platelets of 51K. -Platelets were previously in the 30s two weeks ago -CMP stable at this time -patient reports no major toxicities from Pacritinib  -reasonable to continue Pacritinib at its current dose of 100 MG twice a day -discussed that if there is concerning change in his platelets, he endorses abnormal bruising, or there is concern for an infection, there may be a role to adjust Pacritinib. This is not a concern at this time and he shall continue the medication at its current dose.  -patient reports less spleen discomfort -will plan for an ultrasound in 3-6 months to evaluate the spleen -continue lasix in mornings -Patient does not wish to receive his age-appropriate vaccines at this time, though they would be recommended.  -answered all of patient's and his wife's questions in detail -he shall RTC in 1 month   Follow-up: Weekly labs and PRBC transfusion x 1 appointments MD visit in 4 weeks  The total time spent in the appointment was 30 minutes* .  All of the patient's questions were answered with apparent satisfaction. The patient knows to call the clinic with any problems, questions or concerns.   Wyvonnia Lora MD MS AAHIVMS [facility] Hematology/Oncology  Physician [facility]  .*Total Encounter Time as defined by the Centers for Medicare and Medicaid Services includes, in addition to the face-to-face time of a patient visit (documented in the note above) non-face-to-face time: obtaining and reviewing outside history, ordering and reviewing medications, tests or procedures,  care coordination (communications with other health care professionals or caregivers) and documentation in the medical record.    I,Mitra Faeizi,acting as a Neurosurgeon for Wyvonnia Lora, MD.,have documented all relevant documentation on the behalf of Wyvonnia Lora, MD,as directed by  Wyvonnia Lora, MD while in the presence of Wyvonnia Lora, MD.   .I have reviewed the above documentation for accuracy and completeness, and I agree with the above. Johney Maine MD

## 2023-08-25 ENCOUNTER — Inpatient Hospital Stay: Payer: Medicare Other | Attending: Hematology

## 2023-08-25 ENCOUNTER — Other Ambulatory Visit: Payer: Self-pay

## 2023-08-25 ENCOUNTER — Encounter: Payer: Self-pay | Admitting: Hematology

## 2023-08-25 ENCOUNTER — Inpatient Hospital Stay: Payer: Medicare Other

## 2023-08-25 ENCOUNTER — Inpatient Hospital Stay: Payer: Medicare Other | Admitting: Hematology

## 2023-08-25 DIAGNOSIS — Z7982 Long term (current) use of aspirin: Secondary | ICD-10-CM | POA: Diagnosis not present

## 2023-08-25 DIAGNOSIS — K219 Gastro-esophageal reflux disease without esophagitis: Secondary | ICD-10-CM | POA: Insufficient documentation

## 2023-08-25 DIAGNOSIS — D464 Refractory anemia, unspecified: Secondary | ICD-10-CM | POA: Diagnosis not present

## 2023-08-25 DIAGNOSIS — M1711 Unilateral primary osteoarthritis, right knee: Secondary | ICD-10-CM | POA: Diagnosis not present

## 2023-08-25 DIAGNOSIS — Z860100 Personal history of colon polyps, unspecified: Secondary | ICD-10-CM | POA: Diagnosis not present

## 2023-08-25 DIAGNOSIS — D469 Myelodysplastic syndrome, unspecified: Secondary | ICD-10-CM

## 2023-08-25 DIAGNOSIS — Z87891 Personal history of nicotine dependence: Secondary | ICD-10-CM | POA: Diagnosis not present

## 2023-08-25 DIAGNOSIS — Z8719 Personal history of other diseases of the digestive system: Secondary | ICD-10-CM | POA: Insufficient documentation

## 2023-08-25 DIAGNOSIS — Z79899 Other long term (current) drug therapy: Secondary | ICD-10-CM | POA: Diagnosis not present

## 2023-08-25 DIAGNOSIS — Z1589 Genetic susceptibility to other disease: Secondary | ICD-10-CM | POA: Diagnosis not present

## 2023-08-25 DIAGNOSIS — D471 Chronic myeloproliferative disease: Secondary | ICD-10-CM | POA: Diagnosis not present

## 2023-08-25 DIAGNOSIS — D72829 Elevated white blood cell count, unspecified: Secondary | ICD-10-CM | POA: Insufficient documentation

## 2023-08-25 DIAGNOSIS — E785 Hyperlipidemia, unspecified: Secondary | ICD-10-CM | POA: Insufficient documentation

## 2023-08-25 DIAGNOSIS — D75839 Thrombocytosis, unspecified: Secondary | ICD-10-CM | POA: Insufficient documentation

## 2023-08-25 DIAGNOSIS — E782 Mixed hyperlipidemia: Secondary | ICD-10-CM | POA: Insufficient documentation

## 2023-08-25 DIAGNOSIS — W2209XA Striking against other stationary object, initial encounter: Secondary | ICD-10-CM | POA: Insufficient documentation

## 2023-08-25 LAB — CBC WITH DIFFERENTIAL (CANCER CENTER ONLY)
Abs Immature Granulocytes: 0.02 10*3/uL (ref 0.00–0.07)
Basophils Absolute: 0.1 10*3/uL (ref 0.0–0.1)
Basophils Relative: 3 %
Eosinophils Absolute: 0.3 10*3/uL (ref 0.0–0.5)
Eosinophils Relative: 9 %
HCT: 20.8 % — ABNORMAL LOW (ref 39.0–52.0)
Hemoglobin: 6.8 g/dL — CL (ref 13.0–17.0)
Immature Granulocytes: 1 %
Lymphocytes Relative: 17 %
Lymphs Abs: 0.5 10*3/uL — ABNORMAL LOW (ref 0.7–4.0)
MCH: 28.5 pg (ref 26.0–34.0)
MCHC: 32.7 g/dL (ref 30.0–36.0)
MCV: 87 fL (ref 80.0–100.0)
Monocytes Absolute: 0.3 10*3/uL (ref 0.1–1.0)
Monocytes Relative: 10 %
Neutro Abs: 2 10*3/uL (ref 1.7–7.7)
Neutrophils Relative %: 60 %
Platelet Count: 51 10*3/uL — ABNORMAL LOW (ref 150–400)
RBC: 2.39 MIL/uL — ABNORMAL LOW (ref 4.22–5.81)
RDW: 13.8 % (ref 11.5–15.5)
WBC Count: 3.2 10*3/uL — ABNORMAL LOW (ref 4.0–10.5)
nRBC: 0 % (ref 0.0–0.2)

## 2023-08-25 LAB — SAMPLE TO BLOOD BANK

## 2023-08-25 LAB — PREPARE RBC (CROSSMATCH)

## 2023-08-25 MED ORDER — ACETAMINOPHEN 325 MG PO TABS
650.0000 mg | ORAL_TABLET | Freq: Once | ORAL | Status: AC
Start: 2023-08-25 — End: 2023-08-25
  Administered 2023-08-25: 650 mg via ORAL
  Filled 2023-08-25: qty 2

## 2023-08-25 MED ORDER — HEPARIN SOD (PORK) LOCK FLUSH 100 UNIT/ML IV SOLN
250.0000 [IU] | INTRAVENOUS | Status: AC | PRN
  Administered 2023-08-25: 500 [IU]

## 2023-08-25 MED ORDER — SODIUM CHLORIDE 0.9% FLUSH
10.0000 mL | Freq: Once | INTRAVENOUS | Status: AC
  Administered 2023-08-25: 10 mL via INTRAVENOUS

## 2023-08-25 MED ORDER — SODIUM CHLORIDE 0.9% FLUSH
10.0000 mL | Freq: Once | INTRAVENOUS | Status: AC
  Administered 2023-08-25: 10 mL

## 2023-08-25 MED ORDER — METHYLPREDNISOLONE SODIUM SUCC 40 MG IJ SOLR
40.0000 mg | Freq: Once | INTRAMUSCULAR | Status: AC
  Administered 2023-08-25: 40 mg via INTRAVENOUS
  Filled 2023-08-25: qty 1

## 2023-08-25 MED ORDER — SODIUM CHLORIDE 0.9% IV SOLUTION
250.0000 mL | INTRAVENOUS | Status: DC
  Administered 2023-08-25: 100 mL via INTRAVENOUS

## 2023-08-25 NOTE — Progress Notes (Signed)
CRITICAL VALUE STICKER  CRITICAL VALUE: Hgb 6.8  RECEIVER (on-site recipient of call): Joella Prince, RN   DATE & TIME NOTIFIED: 0830 08/25/23   MESSENGER (representative from lab): Lanora Manis   MD NOTIFIED: Candise Che, MD & Binnie Rail, RN   TIME OF NOTIFICATION: 0830 08/25/23   RESPONSE: 1 unit PRBC ordered

## 2023-08-25 NOTE — Patient Instructions (Signed)

## 2023-08-26 LAB — TYPE AND SCREEN
ABO/RH(D): A POS
Antibody Screen: NEGATIVE
Unit division: 0

## 2023-08-26 LAB — BPAM RBC
Blood Product Expiration Date: 202412052359
ISSUE DATE / TIME: 202411071015
Unit Type and Rh: 6200

## 2023-08-28 ENCOUNTER — Other Ambulatory Visit: Payer: Self-pay | Admitting: Hematology

## 2023-08-28 DIAGNOSIS — D469 Myelodysplastic syndrome, unspecified: Secondary | ICD-10-CM

## 2023-08-29 ENCOUNTER — Encounter: Payer: Self-pay | Admitting: Hematology

## 2023-08-31 ENCOUNTER — Encounter: Payer: Self-pay | Admitting: Hematology

## 2023-08-31 ENCOUNTER — Other Ambulatory Visit: Payer: Self-pay

## 2023-08-31 DIAGNOSIS — D469 Myelodysplastic syndrome, unspecified: Secondary | ICD-10-CM

## 2023-09-01 ENCOUNTER — Inpatient Hospital Stay: Payer: Medicare Other

## 2023-09-01 ENCOUNTER — Other Ambulatory Visit: Payer: Self-pay

## 2023-09-01 VITALS — BP 99/59 | HR 61 | Temp 97.1°F | Resp 19

## 2023-09-01 DIAGNOSIS — Z79899 Other long term (current) drug therapy: Secondary | ICD-10-CM | POA: Diagnosis not present

## 2023-09-01 DIAGNOSIS — D464 Refractory anemia, unspecified: Secondary | ICD-10-CM | POA: Diagnosis not present

## 2023-09-01 DIAGNOSIS — E782 Mixed hyperlipidemia: Secondary | ICD-10-CM | POA: Diagnosis not present

## 2023-09-01 DIAGNOSIS — Z7982 Long term (current) use of aspirin: Secondary | ICD-10-CM | POA: Diagnosis not present

## 2023-09-01 DIAGNOSIS — K219 Gastro-esophageal reflux disease without esophagitis: Secondary | ICD-10-CM | POA: Diagnosis not present

## 2023-09-01 DIAGNOSIS — D75839 Thrombocytosis, unspecified: Secondary | ICD-10-CM | POA: Diagnosis not present

## 2023-09-01 DIAGNOSIS — D72829 Elevated white blood cell count, unspecified: Secondary | ICD-10-CM | POA: Diagnosis not present

## 2023-09-01 DIAGNOSIS — Z87891 Personal history of nicotine dependence: Secondary | ICD-10-CM | POA: Diagnosis not present

## 2023-09-01 DIAGNOSIS — Z8719 Personal history of other diseases of the digestive system: Secondary | ICD-10-CM | POA: Diagnosis not present

## 2023-09-01 DIAGNOSIS — Z860100 Personal history of colon polyps, unspecified: Secondary | ICD-10-CM | POA: Diagnosis not present

## 2023-09-01 DIAGNOSIS — E785 Hyperlipidemia, unspecified: Secondary | ICD-10-CM | POA: Diagnosis not present

## 2023-09-01 DIAGNOSIS — M1711 Unilateral primary osteoarthritis, right knee: Secondary | ICD-10-CM | POA: Diagnosis not present

## 2023-09-01 DIAGNOSIS — D469 Myelodysplastic syndrome, unspecified: Secondary | ICD-10-CM

## 2023-09-01 LAB — CBC WITH DIFFERENTIAL (CANCER CENTER ONLY)
Abs Immature Granulocytes: 0.01 10*3/uL (ref 0.00–0.07)
Basophils Absolute: 0.1 10*3/uL (ref 0.0–0.1)
Basophils Relative: 2 %
Eosinophils Absolute: 0.3 10*3/uL (ref 0.0–0.5)
Eosinophils Relative: 10 %
HCT: 19.6 % — ABNORMAL LOW (ref 39.0–52.0)
Hemoglobin: 6.7 g/dL — CL (ref 13.0–17.0)
Immature Granulocytes: 0 %
Lymphocytes Relative: 13 %
Lymphs Abs: 0.4 10*3/uL — ABNORMAL LOW (ref 0.7–4.0)
MCH: 29.6 pg (ref 26.0–34.0)
MCHC: 34.2 g/dL (ref 30.0–36.0)
MCV: 86.7 fL (ref 80.0–100.0)
Monocytes Absolute: 0.3 10*3/uL (ref 0.1–1.0)
Monocytes Relative: 8 %
Neutro Abs: 2.1 10*3/uL (ref 1.7–7.7)
Neutrophils Relative %: 67 %
Platelet Count: 53 10*3/uL — ABNORMAL LOW (ref 150–400)
RBC: 2.26 MIL/uL — ABNORMAL LOW (ref 4.22–5.81)
RDW: 13.5 % (ref 11.5–15.5)
WBC Count: 3.2 10*3/uL — ABNORMAL LOW (ref 4.0–10.5)
nRBC: 0 % (ref 0.0–0.2)

## 2023-09-01 LAB — PREPARE RBC (CROSSMATCH)

## 2023-09-01 LAB — SAMPLE TO BLOOD BANK

## 2023-09-01 MED ORDER — SODIUM CHLORIDE 0.9% FLUSH
10.0000 mL | Freq: Once | INTRAVENOUS | Status: AC
  Administered 2023-09-01: 10 mL

## 2023-09-01 MED ORDER — METHYLPREDNISOLONE SODIUM SUCC 40 MG IJ SOLR
40.0000 mg | Freq: Once | INTRAMUSCULAR | Status: AC
  Administered 2023-09-01: 40 mg via INTRAVENOUS
  Filled 2023-09-01: qty 1

## 2023-09-01 MED ORDER — ACETAMINOPHEN 325 MG PO TABS
650.0000 mg | ORAL_TABLET | Freq: Once | ORAL | Status: AC
  Administered 2023-09-01: 650 mg via ORAL
  Filled 2023-09-01: qty 2

## 2023-09-01 MED ORDER — HEPARIN SOD (PORK) LOCK FLUSH 100 UNIT/ML IV SOLN
250.0000 [IU] | Freq: Once | INTRAVENOUS | Status: AC
  Administered 2023-09-01: 250 [IU]

## 2023-09-01 MED ORDER — SODIUM CHLORIDE 0.9% IV SOLUTION
250.0000 mL | INTRAVENOUS | Status: DC
  Administered 2023-09-01: 100 mL via INTRAVENOUS

## 2023-09-01 NOTE — Progress Notes (Signed)
CRITICAL VALUE STICKER  CRITICAL VALUE: Hgb 6.7   RECEIVER (on-site recipient of call): Morrie Sheldon   DATE & TIME NOTIFIED: 09/01/23 @ 0813  MESSENGER (representative from lab): Shanda Bumps   MD NOTIFIED: Dr. Margorie John   TIME OF NOTIFICATION: 0960  RESPONSE: Pt has blood transfusion appt at 0830 today

## 2023-09-02 LAB — TYPE AND SCREEN
ABO/RH(D): A POS
Antibody Screen: NEGATIVE
Unit division: 0

## 2023-09-02 LAB — BPAM RBC
Blood Product Expiration Date: 202412112359
ISSUE DATE / TIME: 202411141000
Unit Type and Rh: 6200

## 2023-09-07 ENCOUNTER — Other Ambulatory Visit: Payer: Self-pay | Admitting: Hematology

## 2023-09-07 ENCOUNTER — Other Ambulatory Visit: Payer: Self-pay

## 2023-09-07 DIAGNOSIS — D469 Myelodysplastic syndrome, unspecified: Secondary | ICD-10-CM

## 2023-09-08 ENCOUNTER — Inpatient Hospital Stay: Payer: Medicare Other

## 2023-09-08 ENCOUNTER — Other Ambulatory Visit: Payer: Self-pay

## 2023-09-08 ENCOUNTER — Other Ambulatory Visit: Payer: Self-pay | Admitting: Hematology

## 2023-09-08 ENCOUNTER — Other Ambulatory Visit (HOSPITAL_COMMUNITY): Payer: Self-pay

## 2023-09-08 VITALS — BP 110/57 | HR 61 | Temp 98.1°F | Resp 16 | Wt 162.2 lb

## 2023-09-08 DIAGNOSIS — M1711 Unilateral primary osteoarthritis, right knee: Secondary | ICD-10-CM | POA: Diagnosis not present

## 2023-09-08 DIAGNOSIS — Z8719 Personal history of other diseases of the digestive system: Secondary | ICD-10-CM | POA: Diagnosis not present

## 2023-09-08 DIAGNOSIS — Z860100 Personal history of colon polyps, unspecified: Secondary | ICD-10-CM | POA: Diagnosis not present

## 2023-09-08 DIAGNOSIS — D72829 Elevated white blood cell count, unspecified: Secondary | ICD-10-CM | POA: Diagnosis not present

## 2023-09-08 DIAGNOSIS — D75839 Thrombocytosis, unspecified: Secondary | ICD-10-CM | POA: Diagnosis not present

## 2023-09-08 DIAGNOSIS — E782 Mixed hyperlipidemia: Secondary | ICD-10-CM | POA: Diagnosis not present

## 2023-09-08 DIAGNOSIS — E785 Hyperlipidemia, unspecified: Secondary | ICD-10-CM | POA: Diagnosis not present

## 2023-09-08 DIAGNOSIS — Z7982 Long term (current) use of aspirin: Secondary | ICD-10-CM | POA: Diagnosis not present

## 2023-09-08 DIAGNOSIS — D469 Myelodysplastic syndrome, unspecified: Secondary | ICD-10-CM

## 2023-09-08 DIAGNOSIS — Z79899 Other long term (current) drug therapy: Secondary | ICD-10-CM | POA: Diagnosis not present

## 2023-09-08 DIAGNOSIS — Z87891 Personal history of nicotine dependence: Secondary | ICD-10-CM | POA: Diagnosis not present

## 2023-09-08 DIAGNOSIS — D464 Refractory anemia, unspecified: Secondary | ICD-10-CM | POA: Diagnosis not present

## 2023-09-08 DIAGNOSIS — K219 Gastro-esophageal reflux disease without esophagitis: Secondary | ICD-10-CM | POA: Diagnosis not present

## 2023-09-08 LAB — CBC WITH DIFFERENTIAL (CANCER CENTER ONLY)
Abs Immature Granulocytes: 0.01 10*3/uL (ref 0.00–0.07)
Basophils Absolute: 0.1 10*3/uL (ref 0.0–0.1)
Basophils Relative: 3 %
Eosinophils Absolute: 0.3 10*3/uL (ref 0.0–0.5)
Eosinophils Relative: 8 %
HCT: 19.8 % — ABNORMAL LOW (ref 39.0–52.0)
Hemoglobin: 6.5 g/dL — CL (ref 13.0–17.0)
Immature Granulocytes: 0 %
Lymphocytes Relative: 15 %
Lymphs Abs: 0.5 10*3/uL — ABNORMAL LOW (ref 0.7–4.0)
MCH: 29.4 pg (ref 26.0–34.0)
MCHC: 32.8 g/dL (ref 30.0–36.0)
MCV: 89.6 fL (ref 80.0–100.0)
Monocytes Absolute: 0.3 10*3/uL (ref 0.1–1.0)
Monocytes Relative: 9 %
Neutro Abs: 2.2 10*3/uL (ref 1.7–7.7)
Neutrophils Relative %: 65 %
Platelet Count: 69 10*3/uL — ABNORMAL LOW (ref 150–400)
RBC: 2.21 MIL/uL — ABNORMAL LOW (ref 4.22–5.81)
RDW: 13.6 % (ref 11.5–15.5)
WBC Count: 3.3 10*3/uL — ABNORMAL LOW (ref 4.0–10.5)
nRBC: 0 % (ref 0.0–0.2)

## 2023-09-08 LAB — PREPARE RBC (CROSSMATCH)

## 2023-09-08 MED ORDER — SODIUM CHLORIDE 0.9% FLUSH
10.0000 mL | Freq: Once | INTRAVENOUS | Status: AC
  Administered 2023-09-08: 10 mL

## 2023-09-08 MED ORDER — HEPARIN SOD (PORK) LOCK FLUSH 100 UNIT/ML IV SOLN
500.0000 [IU] | Freq: Once | INTRAVENOUS | Status: AC
  Administered 2023-09-08: 500 [IU]

## 2023-09-08 MED ORDER — POTASSIUM CHLORIDE CRYS ER 20 MEQ PO TBCR: 20.0000 meq | EXTENDED_RELEASE_TABLET | Freq: Two times a day (BID) | ORAL | 0 refills | Status: DC

## 2023-09-08 MED ORDER — SODIUM CHLORIDE 0.9% IV SOLUTION
250.0000 mL | INTRAVENOUS | Status: DC
  Administered 2023-09-08: 100 mL via INTRAVENOUS

## 2023-09-08 MED ORDER — ACETAMINOPHEN 325 MG PO TABS
650.0000 mg | ORAL_TABLET | Freq: Once | ORAL | Status: AC
  Administered 2023-09-08: 650 mg via ORAL
  Filled 2023-09-08: qty 2

## 2023-09-08 MED ORDER — METHYLPREDNISOLONE SODIUM SUCC 40 MG IJ SOLR
40.0000 mg | Freq: Once | INTRAMUSCULAR | Status: AC
  Administered 2023-09-08: 40 mg via INTRAVENOUS
  Filled 2023-09-08: qty 1

## 2023-09-08 NOTE — Progress Notes (Signed)
CRITICAL VALUE STICKER  CRITICAL VALUE: hgb 6.5  RECEIVER (on-site recipient of call): Morrie Sheldon   DATE & TIME NOTIFIED: 11/21 @ 0858  MESSENGER (representative from lab): Byrd Hesselbach   MD NOTIFIED: Candise Che  TIME OF NOTIFICATION: 0900  RESPONSE: Notified MD/Nurse

## 2023-09-08 NOTE — Patient Instructions (Signed)

## 2023-09-08 NOTE — Progress Notes (Signed)
Specialty Pharmacy Refill Coordination Note  Fernando Lee is a 77 y.o. male contacted today regarding refills of specialty medication(s) Pacritinib Citrate   Patient requested Fernando Lee at [facility] at [place] date: 09/16/23   Medication will be filled on 09/14/23.  Refill request pending. Notify if delayed.

## 2023-09-09 ENCOUNTER — Other Ambulatory Visit: Payer: Self-pay

## 2023-09-09 ENCOUNTER — Other Ambulatory Visit (HOSPITAL_COMMUNITY): Payer: Self-pay

## 2023-09-09 LAB — TYPE AND SCREEN
ABO/RH(D): A POS
Antibody Screen: NEGATIVE
Unit division: 0
Unit division: 0

## 2023-09-09 LAB — BPAM RBC
Blood Product Expiration Date: 202412142359
Blood Product Expiration Date: 202412142359
ISSUE DATE / TIME: 202411211042
ISSUE DATE / TIME: 202411211042
Unit Type and Rh: 6200
Unit Type and Rh: 6200

## 2023-09-09 MED ORDER — VONJO 100 MG PO CAPS
100.0000 mg | ORAL_CAPSULE | Freq: Two times a day (BID) | ORAL | 1 refills | Status: DC
  Filled 2023-09-09: qty 60, 30d supply, fill #0

## 2023-09-14 ENCOUNTER — Other Ambulatory Visit: Payer: Self-pay

## 2023-09-14 ENCOUNTER — Inpatient Hospital Stay: Payer: Medicare Other

## 2023-09-14 VITALS — BP 97/69 | HR 61 | Temp 98.0°F | Resp 16

## 2023-09-14 DIAGNOSIS — K2100 Gastro-esophageal reflux disease with esophagitis, without bleeding: Secondary | ICD-10-CM | POA: Diagnosis not present

## 2023-09-14 DIAGNOSIS — G4733 Obstructive sleep apnea (adult) (pediatric): Secondary | ICD-10-CM | POA: Diagnosis not present

## 2023-09-14 DIAGNOSIS — D469 Myelodysplastic syndrome, unspecified: Secondary | ICD-10-CM

## 2023-09-14 DIAGNOSIS — D464 Refractory anemia, unspecified: Secondary | ICD-10-CM | POA: Diagnosis not present

## 2023-09-14 DIAGNOSIS — S70361A Insect bite (nonvenomous), right thigh, initial encounter: Secondary | ICD-10-CM | POA: Diagnosis not present

## 2023-09-14 DIAGNOSIS — Z860100 Personal history of colon polyps, unspecified: Secondary | ICD-10-CM | POA: Diagnosis not present

## 2023-09-14 DIAGNOSIS — R7303 Prediabetes: Secondary | ICD-10-CM | POA: Diagnosis not present

## 2023-09-14 DIAGNOSIS — R339 Retention of urine, unspecified: Secondary | ICD-10-CM | POA: Diagnosis not present

## 2023-09-14 DIAGNOSIS — D84821 Immunodeficiency due to drugs: Secondary | ICD-10-CM | POA: Diagnosis not present

## 2023-09-14 DIAGNOSIS — Z7982 Long term (current) use of aspirin: Secondary | ICD-10-CM | POA: Diagnosis not present

## 2023-09-14 DIAGNOSIS — Z79622 Long term (current) use of janus kinase inhibitor: Secondary | ICD-10-CM | POA: Diagnosis not present

## 2023-09-14 DIAGNOSIS — Z87442 Personal history of urinary calculi: Secondary | ICD-10-CM | POA: Diagnosis not present

## 2023-09-14 DIAGNOSIS — E782 Mixed hyperlipidemia: Secondary | ICD-10-CM | POA: Diagnosis not present

## 2023-09-14 DIAGNOSIS — Z8249 Family history of ischemic heart disease and other diseases of the circulatory system: Secondary | ICD-10-CM | POA: Diagnosis not present

## 2023-09-14 DIAGNOSIS — Z823 Family history of stroke: Secondary | ICD-10-CM | POA: Diagnosis not present

## 2023-09-14 DIAGNOSIS — B029 Zoster without complications: Secondary | ICD-10-CM | POA: Diagnosis not present

## 2023-09-14 DIAGNOSIS — K219 Gastro-esophageal reflux disease without esophagitis: Secondary | ICD-10-CM | POA: Diagnosis not present

## 2023-09-14 DIAGNOSIS — Z87891 Personal history of nicotine dependence: Secondary | ICD-10-CM | POA: Diagnosis not present

## 2023-09-14 DIAGNOSIS — B028 Zoster with other complications: Secondary | ICD-10-CM | POA: Diagnosis not present

## 2023-09-14 DIAGNOSIS — Z79899 Other long term (current) drug therapy: Secondary | ICD-10-CM | POA: Diagnosis not present

## 2023-09-14 DIAGNOSIS — G43109 Migraine with aura, not intractable, without status migrainosus: Secondary | ICD-10-CM | POA: Diagnosis not present

## 2023-09-14 LAB — CBC WITH DIFFERENTIAL (CANCER CENTER ONLY)
Abs Immature Granulocytes: 0.01 10*3/uL (ref 0.00–0.07)
Basophils Absolute: 0.1 10*3/uL (ref 0.0–0.1)
Basophils Relative: 3 %
Eosinophils Absolute: 0.3 10*3/uL (ref 0.0–0.5)
Eosinophils Relative: 8 %
HCT: 22.5 % — ABNORMAL LOW (ref 39.0–52.0)
Hemoglobin: 7.5 g/dL — ABNORMAL LOW (ref 13.0–17.0)
Immature Granulocytes: 0 %
Lymphocytes Relative: 10 %
Lymphs Abs: 0.4 10*3/uL — ABNORMAL LOW (ref 0.7–4.0)
MCH: 29.6 pg (ref 26.0–34.0)
MCHC: 33.3 g/dL (ref 30.0–36.0)
MCV: 88.9 fL (ref 80.0–100.0)
Monocytes Absolute: 0.4 10*3/uL (ref 0.1–1.0)
Monocytes Relative: 10 %
Neutro Abs: 2.5 10*3/uL (ref 1.7–7.7)
Neutrophils Relative %: 69 %
Platelet Count: 77 10*3/uL — ABNORMAL LOW (ref 150–400)
RBC: 2.53 MIL/uL — ABNORMAL LOW (ref 4.22–5.81)
RDW: 13.6 % (ref 11.5–15.5)
WBC Count: 3.7 10*3/uL — ABNORMAL LOW (ref 4.0–10.5)
nRBC: 0 % (ref 0.0–0.2)

## 2023-09-14 LAB — SAMPLE TO BLOOD BANK

## 2023-09-14 LAB — PREPARE RBC (CROSSMATCH)

## 2023-09-14 MED ORDER — ACETAMINOPHEN 325 MG PO TABS
650.0000 mg | ORAL_TABLET | Freq: Once | ORAL | Status: AC
  Administered 2023-09-14: 650 mg via ORAL
  Filled 2023-09-14: qty 2

## 2023-09-14 MED ORDER — METHYLPREDNISOLONE SODIUM SUCC 40 MG IJ SOLR
40.0000 mg | Freq: Once | INTRAMUSCULAR | Status: AC
  Administered 2023-09-14: 40 mg via INTRAVENOUS
  Filled 2023-09-14: qty 1

## 2023-09-14 MED ORDER — SODIUM CHLORIDE 0.9% FLUSH
10.0000 mL | Freq: Once | INTRAVENOUS | Status: AC
  Administered 2023-09-14: 10 mL

## 2023-09-14 MED ORDER — HEPARIN SOD (PORK) LOCK FLUSH 100 UNIT/ML IV SOLN
500.0000 [IU] | Freq: Every day | INTRAVENOUS | Status: AC | PRN
Start: 2023-09-14 — End: 2023-09-14
  Administered 2023-09-14: 500 [IU]

## 2023-09-14 MED ORDER — SODIUM CHLORIDE 0.9% FLUSH
10.0000 mL | INTRAVENOUS | Status: AC | PRN
Start: 2023-09-14 — End: 2023-09-14
  Administered 2023-09-14: 10 mL

## 2023-09-14 MED ORDER — SODIUM CHLORIDE 0.9% IV SOLUTION
250.0000 mL | INTRAVENOUS | Status: DC
  Administered 2023-09-14: 100 mL via INTRAVENOUS

## 2023-09-16 ENCOUNTER — Other Ambulatory Visit: Payer: Self-pay

## 2023-09-16 ENCOUNTER — Other Ambulatory Visit (HOSPITAL_COMMUNITY): Payer: Self-pay

## 2023-09-16 ENCOUNTER — Encounter (HOSPITAL_COMMUNITY): Payer: Self-pay

## 2023-09-16 ENCOUNTER — Inpatient Hospital Stay (HOSPITAL_COMMUNITY)
Admission: EM | Admit: 2023-09-16 | Discharge: 2023-09-20 | DRG: 866 | Disposition: A | Discharge status: Home / Self Care | Payer: Medicare Other | Attending: Internal Medicine | Admitting: Internal Medicine

## 2023-09-16 DIAGNOSIS — R7303 Prediabetes: Secondary | ICD-10-CM | POA: Diagnosis present

## 2023-09-16 DIAGNOSIS — Z79622 Long term (current) use of janus kinase inhibitor: Secondary | ICD-10-CM

## 2023-09-16 DIAGNOSIS — E782 Mixed hyperlipidemia: Secondary | ICD-10-CM | POA: Diagnosis present

## 2023-09-16 DIAGNOSIS — Z87442 Personal history of urinary calculi: Secondary | ICD-10-CM | POA: Diagnosis not present

## 2023-09-16 DIAGNOSIS — G43109 Migraine with aura, not intractable, without status migrainosus: Secondary | ICD-10-CM | POA: Diagnosis present

## 2023-09-16 DIAGNOSIS — Z860100 Personal history of colon polyps, unspecified: Secondary | ICD-10-CM

## 2023-09-16 DIAGNOSIS — S70361A Insect bite (nonvenomous), right thigh, initial encounter: Secondary | ICD-10-CM | POA: Diagnosis present

## 2023-09-16 DIAGNOSIS — D464 Refractory anemia, unspecified: Secondary | ICD-10-CM | POA: Diagnosis present

## 2023-09-16 DIAGNOSIS — Z79899 Other long term (current) drug therapy: Secondary | ICD-10-CM

## 2023-09-16 DIAGNOSIS — D471 Chronic myeloproliferative disease: Secondary | ICD-10-CM | POA: Diagnosis present

## 2023-09-16 DIAGNOSIS — D469 Myelodysplastic syndrome, unspecified: Secondary | ICD-10-CM | POA: Diagnosis not present

## 2023-09-16 DIAGNOSIS — L308 Other specified dermatitis: Secondary | ICD-10-CM | POA: Diagnosis not present

## 2023-09-16 DIAGNOSIS — K219 Gastro-esophageal reflux disease without esophagitis: Secondary | ICD-10-CM | POA: Diagnosis present

## 2023-09-16 DIAGNOSIS — G4733 Obstructive sleep apnea (adult) (pediatric): Secondary | ICD-10-CM | POA: Diagnosis present

## 2023-09-16 DIAGNOSIS — W57XXXA Bitten or stung by nonvenomous insect and other nonvenomous arthropods, initial encounter: Secondary | ICD-10-CM | POA: Diagnosis present

## 2023-09-16 DIAGNOSIS — R339 Retention of urine, unspecified: Principal | ICD-10-CM | POA: Diagnosis present

## 2023-09-16 DIAGNOSIS — K2100 Gastro-esophageal reflux disease with esophagitis, without bleeding: Secondary | ICD-10-CM | POA: Diagnosis present

## 2023-09-16 DIAGNOSIS — Z7982 Long term (current) use of aspirin: Secondary | ICD-10-CM | POA: Diagnosis not present

## 2023-09-16 DIAGNOSIS — R7301 Impaired fasting glucose: Secondary | ICD-10-CM | POA: Diagnosis present

## 2023-09-16 DIAGNOSIS — Z823 Family history of stroke: Secondary | ICD-10-CM | POA: Diagnosis not present

## 2023-09-16 DIAGNOSIS — B028 Zoster with other complications: Secondary | ICD-10-CM | POA: Diagnosis not present

## 2023-09-16 DIAGNOSIS — B029 Zoster without complications: Secondary | ICD-10-CM | POA: Diagnosis not present

## 2023-09-16 DIAGNOSIS — D84821 Immunodeficiency due to drugs: Secondary | ICD-10-CM | POA: Diagnosis present

## 2023-09-16 DIAGNOSIS — D649 Anemia, unspecified: Secondary | ICD-10-CM | POA: Diagnosis present

## 2023-09-16 DIAGNOSIS — Z87891 Personal history of nicotine dependence: Secondary | ICD-10-CM

## 2023-09-16 DIAGNOSIS — R338 Other retention of urine: Secondary | ICD-10-CM | POA: Diagnosis present

## 2023-09-16 DIAGNOSIS — Z8249 Family history of ischemic heart disease and other diseases of the circulatory system: Secondary | ICD-10-CM | POA: Diagnosis not present

## 2023-09-16 DIAGNOSIS — D849 Immunodeficiency, unspecified: Secondary | ICD-10-CM

## 2023-09-16 LAB — TYPE AND SCREEN
ABO/RH(D): A POS
Antibody Screen: NEGATIVE
Unit division: 0

## 2023-09-16 LAB — URINALYSIS, ROUTINE W REFLEX MICROSCOPIC
Bilirubin Urine: NEGATIVE
Glucose, UA: NEGATIVE mg/dL
Hgb urine dipstick: NEGATIVE
Ketones, ur: NEGATIVE mg/dL
Leukocytes,Ua: NEGATIVE
Nitrite: NEGATIVE
Protein, ur: NEGATIVE mg/dL
Specific Gravity, Urine: 1.019 (ref 1.005–1.030)
pH: 5 (ref 5.0–8.0)

## 2023-09-16 LAB — BASIC METABOLIC PANEL
Anion gap: 7 (ref 5–15)
BUN: 26 mg/dL — ABNORMAL HIGH (ref 8–23)
CO2: 26 mmol/L (ref 22–32)
Calcium: 8.8 mg/dL — ABNORMAL LOW (ref 8.9–10.3)
Chloride: 103 mmol/L (ref 98–111)
Creatinine, Ser: 0.87 mg/dL (ref 0.61–1.24)
GFR, Estimated: >60 mL/min (ref >60)
Glucose, Bld: 133 mg/dL — ABNORMAL HIGH (ref 70–99)
Potassium: 4 mmol/L (ref 3.5–5.1)
Sodium: 136 mmol/L (ref 135–145)

## 2023-09-16 LAB — CBC
HCT: 24.1 % — ABNORMAL LOW (ref 39.0–52.0)
Hemoglobin: 7.8 g/dL — ABNORMAL LOW (ref 13.0–17.0)
MCH: 29.3 pg (ref 26.0–34.0)
MCHC: 32.4 g/dL (ref 30.0–36.0)
MCV: 90.6 fL (ref 80.0–100.0)
Platelets: 102 10*3/uL — ABNORMAL LOW (ref 150–400)
RBC: 2.66 MIL/uL — ABNORMAL LOW (ref 4.22–5.81)
RDW: 13.8 % (ref 11.5–15.5)
WBC: 4.5 10*3/uL (ref 4.0–10.5)
nRBC: 0 % (ref 0.0–0.2)

## 2023-09-16 LAB — BPAM RBC
Blood Product Expiration Date: 202412152359
ISSUE DATE / TIME: 202411271024
Unit Type and Rh: 6200

## 2023-09-16 MED ORDER — POTASSIUM CHLORIDE CRYS ER 10 MEQ PO TBCR
10.0000 meq | EXTENDED_RELEASE_TABLET | Freq: Every day | ORAL | Status: DC
  Filled 2023-09-16: qty 1

## 2023-09-16 MED ORDER — ACETAMINOPHEN 650 MG RE SUPP: 650.0000 mg | Freq: Four times a day (QID) | RECTAL | Status: DC | PRN

## 2023-09-16 MED ORDER — LACTATED RINGERS IV SOLN: INTRAVENOUS | Status: AC

## 2023-09-16 MED ORDER — FUROSEMIDE 20 MG PO TABS: 20.0000 mg | ORAL_TABLET | Freq: Every day | ORAL | Status: DC

## 2023-09-16 MED ORDER — DOCUSATE SODIUM 100 MG PO CAPS
100.0000 mg | ORAL_CAPSULE | Freq: Two times a day (BID) | ORAL | Status: DC
  Administered 2023-09-16 – 2023-09-20 (×8): 100 mg via ORAL
  Filled 2023-09-16 (×8): qty 1

## 2023-09-16 MED ORDER — ONDANSETRON HCL 4 MG PO TABS: 4.0000 mg | ORAL_TABLET | Freq: Four times a day (QID) | ORAL | Status: DC | PRN

## 2023-09-16 MED ORDER — DEXTROSE 5 % IV SOLN
10.0000 mg/kg | Freq: Three times a day (TID) | INTRAVENOUS | Status: DC
  Administered 2023-09-16: 710 mg via INTRAVENOUS
  Filled 2023-09-16 (×3): qty 14.2

## 2023-09-16 MED ORDER — ONDANSETRON HCL 4 MG/2ML IJ SOLN: 4.0000 mg | Freq: Four times a day (QID) | INTRAMUSCULAR | Status: DC | PRN

## 2023-09-16 MED ORDER — ALFUZOSIN HCL ER 10 MG PO TB24
10.0000 mg | ORAL_TABLET | Freq: Every day | ORAL | Status: DC
  Administered 2023-09-17 – 2023-09-20 (×4): 10 mg via ORAL
  Filled 2023-09-16 (×5): qty 1

## 2023-09-16 MED ORDER — ACETAMINOPHEN 325 MG PO TABS
650.0000 mg | ORAL_TABLET | Freq: Four times a day (QID) | ORAL | Status: DC | PRN
  Administered 2023-09-16 – 2023-09-20 (×8): 650 mg via ORAL
  Filled 2023-09-16 (×8): qty 2

## 2023-09-16 MED ORDER — DEXTROSE 5 % IV SOLN
700.0000 mg | Freq: Three times a day (TID) | INTRAVENOUS | Status: DC
  Administered 2023-09-16 – 2023-09-19 (×8): 700 mg via INTRAVENOUS
  Filled 2023-09-16 (×4): qty 14
  Filled 2023-09-16: qty 10
  Filled 2023-09-16 (×4): qty 14

## 2023-09-16 NOTE — Plan of Care (Signed)
  Problem: Education: Goal: Knowledge of General Education information will improve Description: Including pain rating scale, medication(s)/side effects and non-pharmacologic comfort measures Outcome: Progressing   Problem: Skin Integrity: Goal: Risk for impaired skin integrity will decrease Outcome: Progressing   Problem: Pain Management: Goal: General experience of comfort will improve Outcome: Progressing

## 2023-09-16 NOTE — H&P (Signed)
History and Physical    Patient: Fernando Lee WGN:[id number] DOB: [birth year] DOA: 09/16/2023 DOS: the patient was seen and examined on 09/16/2023 PCP: Jackelyn Poling, DO  Patient coming from: Home  Chief Complaint:  Chief Complaint  Patient presents with   Urinary Retention   HPI: Fernando Lee is a 77 y.o. 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  He also has a right buttock area rash as well.  He developed urinary retention after last night despite taking alfuzosin. He denied fever, chills, rhinorrhea, sore throat, wheezing or hemoptysis.  No chest pain, palpitations, diaphoresis, PND, orthopnea or pitting edema of the lower extremities.  No abdominal pain, nausea, emesis, diarrhea, constipation, melena or hematochezia.  No flank pain, dysuria, frequency or hematuria.  No polyuria, polydipsia, polyphagia or blurred vision.   Lab work: His urine analysis was normal.  BMP showed a glucose of 133, BUN of 26 and calcium of 8.8 mg/dL.  Creatinine and the rest of the electrolytes were normal.  CBC showed white count 4.5, hemoglobin 7.8 g/dL platelets 784.   ED course: Initial vital signs were temperature 97.4 F, pulse 73, respirations, BP 123/65 mmHg O2 sat 100% on room air.  The patient was started on acyclovir per pharmacy and LR at 125 mL/h.  Review of Systems: As mentioned in the history of present illness. All other systems reviewed and are negative. Past Medical History:  Diagnosis Date   Arthritis of right knee    Cervicalgia     Chest discomfort    normal stress echo   Chest pain    Colon polyps    Dyslipidemia    Fluttering sensation of heart    GERD without esophagitis    Hematochezia    Hyperglycemia    Hyperlipidemia    Internal hemorrhoids    Kidney stones    Male erectile dysfunction, unspecified    Mixed dyslipidemia    Mixed hyperlipidemia    Ocular migraine    PAC (premature atrial contraction)    Personal history of colonic polyps    Prediabetes    Residual hemorrhoidal skin tags    Spleen enlarged    nov 2023 hospitalized   Unspecified hemorrhoids    Past Surgical History:  Procedure Laterality Date   BONE MARROW BIOPSY     IR IMAGING GUIDED PORT INSERTION  09/14/2022   KIDNEY STONE SURGERY     retrieval   Social History:  reports that he has quit smoking. His smoking use included cigarettes. He has never used smokeless tobacco. He reports current alcohol use of about 1.0 standard drink of alcohol per week. No history on file for drug use.  No Known Allergies  Family History  Problem Relation Age of Onset   Stroke Brother    Congestive Heart Failure Brother     Prior to Admission medications   Medication Sig Start Date End Date Taking? Authorizing Provider  ALFUZOSIN HCL ER PO Take 1 tablet by mouth daily after supper. 09/02/22   [provider]  aspirin EC 81 MG tablet Take 81 mg by mouth daily. Swallow whole.    [provider]  B Complex Vitamins (B  COMPLEX PO) Take 1 tablet by mouth daily.    [provider]  Cholecalciferol (VITAMIN D3) 50 MCG (2000 UT) TABS Take 2,000 Units by mouth daily.    [provider]  docusate sodium (COLACE) 100 MG capsule Take 100 mg by mouth 2 (two) times daily.    [provider]  doxycycline (VIBRAMYCIN) 100 MG capsule Take 1 capsule (100 mg total) by mouth 2 (two) times daily. 04/23/23   Roemhildt, Lorin T, PA-C  FIBER PO Take 1 capsule by mouth daily. Unknown strength    [provider]   fish oil-omega-3 fatty acids 1000 MG capsule Take 1 g by mouth 2 (two) times daily. Strength 300mg /1500mg     [provider]  Flaxseed, Linseed, (FLAXSEED OIL) 1000 MG CAPS Take 1,000 mg by mouth daily.    [provider]  furosemide (LASIX) 20 MG tablet Take 1 tablet by mouth once daily 08/29/23   Johney Maine, MD  GLUCOSAMINE CHONDROITIN MSM PO Take 1 tablet by mouth daily. Strength 1500/1500    [provider]  Misc Natural Products (PROSTATE SUPPORT PO) Take 1 capsule by mouth daily. Unknown strength    [provider]  Multiple Vitamin (MULTIVITAMIN) tablet Take 1 tablet by mouth daily. Unknown strength    [provider]  pacritinib citrate (VONJO) 100 MG capsule Take 1 capsule (100 mg total) by mouth 2 (two) times daily. 09/09/23   Johney Maine, MD  potassium chloride SA (KLOR-CON M) 20 MEQ tablet Take 1 tablet (20 mEq total) by mouth 2 (two) times daily. Take while on lasix 09/08/23   Johney Maine, MD  psyllium (METAMUCIL) 58.6 % powder Take 1 packet by mouth daily.    [provider]  senna-docusate (SENNA S) 8.6-50 MG tablet Take 2 tablets by mouth at bedtime. 09/24/22   Johney Maine, MD  traMADol (ULTRAM) 50 MG tablet Take 50 mg by mouth as needed for moderate pain or severe pain.    [provider]  Turmeric (QC TUMERIC COMPLEX PO) Take 750 mg by mouth daily.    [provider]  vitamin C (ASCORBIC ACID) 250 MG tablet Take 500 mg by mouth daily.    [provider]    Physical Exam: Vitals:   09/16/23 1051  BP: 123/65  Pulse: 73  Temp: (!) 97.4 F (36.3 C)  TempSrc: Oral  SpO2: 100%  Weight: 71.2 kg  Height: 5\' 6"  (1.676 m)   Physical Exam Vitals and nursing note reviewed.  Constitutional:      General: He is awake. He is not in acute distress.    Appearance: Normal appearance. He is overweight. He is ill-appearing.  HENT:     Head: Normocephalic.     Nose: No  rhinorrhea.     Mouth/Throat:     Mouth: Mucous membranes are moist.  Eyes:     General: No scleral icterus.    Pupils: Pupils are equal, round, and reactive to light.  Neck:     Vascular: No JVD.  Cardiovascular:     Rate and Rhythm: Normal rate and regular rhythm.     Heart sounds: S1 normal and S2 normal.  Pulmonary:     Effort: Pulmonary effort is normal.     Breath sounds: Normal breath sounds. No wheezing, rhonchi or rales.  Abdominal:     General: Bowel sounds are normal.     Palpations: Abdomen is soft.     Tenderness: There is no abdominal  tenderness.  Musculoskeletal:     Cervical back: Neck supple.     Right lower leg: No edema.     Left lower leg: No edema.  Skin:    General: Skin is warm and dry.     Findings: Erythema and lesion present.     Comments: Erythematous rash in right thigh and inguinal area.  Right buttock erythematosus rash with multiple healing lesions.  Neurological:     General: No focal deficit present.     Mental Status: He is alert and oriented to person, place, and time.  Psychiatric:        Mood and Affect: Mood normal.        Behavior: Behavior normal. Behavior is cooperative.     Data Reviewed:  Results are pending, will review when available.  Assessment and Plan: Principal Problem:   Herpes zoster dermatitis Inpatient/MedSurg. Continue IV fluids. Continue acyclovir per pharmacy. Analgesics as needed. Antiemetics as needed. Hold Vonjo. Follow CBC, CMP and lipase in AM.  Active Problems:   Acute urinary retention Continue Foley catheter for at least 24 hours. Avoid medications with antimuscarinic effect.    MDS (myelodysplastic syndrome) (HCC) Holding Vonjo due to active infection. Per ED, oncology will be rounding on the patient.    Gastroesophageal reflux disease without esophagitis Antiacid, H2 blocker or PPI as needed.    Obstructive sleep apnea CPAP at bedtime.    Impaired fasting glucose Follow fasting  glucose. Begin CBG monitoring to start trending up.    Anemia Monitor hematocrit and hemoglobin. Diffuse as needed.    Advance Care Planning:   Code Status: Full Code   Consults:   Family Communication:   Severity of Illness: The appropriate patient status for this patient is INPATIENT. Inpatient status is judged to be reasonable and necessary in order to provide the required intensity of service to ensure the patient's safety. The patient's presenting symptoms, physical exam findings, and initial radiographic and laboratory data in the context of their chronic comorbidities is felt to place them at high risk for further clinical deterioration. Furthermore, it is not anticipated that the patient will be medically stable for discharge from the hospital within 2 midnights of admission.   * I certify that at the point of admission it is my clinical judgment that the patient will require inpatient hospital care spanning beyond 2 midnights from the point of admission due to high intensity of service, high risk for further deterioration and high frequency of surveillance required.*  Author: Bobette Mo, MD 09/16/2023 1:14 PM  For on call review [email or web address].   This document was prepared using Dragon voice recognition software and may contain some unintended transcription errors.

## 2023-09-16 NOTE — ED Triage Notes (Addendum)
Pt was bite by bugs on his thigh and penis over the weekend while raking leaves. Today pt presenting unable to pee. Pt states that he is able to produce some dribble but unable to completely empty his bladder.

## 2023-09-16 NOTE — ED Notes (Signed)
BLADDER SCAN IS NOT AVAILABLE RIGHT NOW

## 2023-09-16 NOTE — ED Notes (Signed)
ED TO INPATIENT HANDOFF REPORT  ED Nurse Name and Phone #: Suann Larry Name/Age/Gender Fernando Lee 77 y.o. male Room/Bed: WA09/WA09  Code Status   Code Status: Prior  Home/SNF/Other Home Patient oriented to: self, place, time, and situation Is this baseline? Yes   Triage Complete: Triage complete  Chief Complaint Herpes zoster dermatitis [B02.8, L30.8]  Triage Note Pt was bite by bugs on his thigh and penis over the weekend while raking leaves. Today pt presenting unable to pee. Pt states that he is able to produce some dribble but unable to completely empty his bladder.    Allergies No Known Allergies  Level of Care/Admitting Diagnosis ED Disposition     ED Disposition  Admit   Condition  --   Comment  Hospital [facility]: [facility] [[id number]]  Level of Care: Med-Surg [16]  May admit patient to [facility] or [facility] if equivalent level of care is available:: No  Covid Evaluation: Asymptomatic - no recent exposure (last 10 days) testing not required  Diagnosis: Herpes zoster dermatitis [724043]  Admitting Physician: Bobette Mo [[id number]]  Attending Physician: Bobette Mo [[id number]]  Certification:: I certify this patient will need inpatient services for at least 2 midnights  Expected Medical Readiness: 09/18/2023          B Medical/Surgery History Past Medical History:  Diagnosis Date   Arthritis of right knee    Cervicalgia    Chest discomfort    normal stress echo   Chest pain    Colon polyps    Dyslipidemia    Fluttering sensation of heart    GERD without esophagitis    Hematochezia    Hyperglycemia    Hyperlipidemia    Internal hemorrhoids    Kidney stones    Male erectile dysfunction, unspecified    Mixed dyslipidemia    Mixed hyperlipidemia    Ocular migraine    PAC (premature atrial contraction)    Personal history of colonic polyps    Prediabetes    Residual hemorrhoidal skin tags    Spleen enlarged     nov 2023 hospitalized   Unspecified hemorrhoids    Past Surgical History:  Procedure Laterality Date   BONE MARROW BIOPSY     IR IMAGING GUIDED PORT INSERTION  09/14/2022   KIDNEY STONE SURGERY     retrieval     A IV Location/Drains/Wounds Patient Lines/Drains/Airways Status     Active Line/Drains/Airways     Name Placement date Placement time Site Days   Implanted Port 09/14/22 Right Chest 09/14/22  1620  Chest  367            Intake/Output Last 24 hours No intake or output data in the 24 hours ending 09/16/23 1316  Labs/Imaging Results for orders placed or performed during the hospital encounter of 09/16/23 (from the past 48 hour(s))  Urinalysis, Routine w reflex microscopic -Urine, Clean Catch     Status: None   Collection Time: 09/16/23 11:01 AM  Result Value Ref Range   Color, Urine YELLOW YELLOW   APPearance CLEAR CLEAR   Specific Gravity, Urine 1.019 1.005 - 1.030   pH 5.0 5.0 - 8.0   Glucose, UA NEGATIVE NEGATIVE mg/dL   Hgb urine dipstick NEGATIVE NEGATIVE   Bilirubin Urine NEGATIVE NEGATIVE   Ketones, ur NEGATIVE NEGATIVE mg/dL   Protein, ur NEGATIVE NEGATIVE mg/dL   Nitrite NEGATIVE NEGATIVE   Leukocytes,Ua NEGATIVE NEGATIVE    Comment: Performed at [facility], [address], [place], [place] [address]  Basic metabolic panel     Status: Abnormal   Collection Time: 09/16/23 11:43 AM  Result Value Ref Range   Sodium 136 135 - 145 mmol/L   Potassium 4.0 3.5 - 5.1 mmol/L   Chloride 103 98 - 111 mmol/L   CO2 26 22 - 32 mmol/L   Glucose, Bld 133 (H) 70 - 99 mg/dL    Comment: Glucose reference range applies only to samples taken after fasting for at least 8 hours.   BUN 26 (H) 8 - 23 mg/dL   Creatinine, Ser 6.04 0.61 - 1.24 mg/dL   Calcium 8.8 (L) 8.9 - 10.3 mg/dL   GFR, Estimated >54 >09 mL/min    Comment: (NOTE) Calculated using the CKD-EPI Creatinine Equation (2021)    Anion gap 7 5 - 15    Comment: Performed at [facility], [address] [place], [place] [address]  CBC     Status: Abnormal   Collection Time: 09/16/23 11:43 AM  Result Value Ref Range   WBC 4.5 4.0 - 10.5 K/uL   RBC 2.66 (L) 4.22 - 5.81 MIL/uL   Hemoglobin 7.8 (L) 13.0 - 17.0 g/dL   HCT 47.8 (L) 29.5 - 62.1 %   MCV 90.6 80.0 - 100.0 fL   MCH 29.3 26.0 - 34.0 pg   MCHC 32.4 30.0 - 36.0 g/dL   RDW 30.8 65.7 - 84.6 %   Platelets 102 (L) 150 - 400 K/uL    Comment: SPECIMEN CHECKED FOR CLOTS REPEATED TO VERIFY PLATELET COUNT CONFIRMED BY SMEAR    nRBC 0.0 0.0 - 0.2 %    Comment: Performed at [facility], [address] [place], [place] [address]   No results found.  Pending Labs [employer] (From admission, onward)     Start     Ordered   09/17/23 0500  Basic metabolic panel  Daily,   R      09/16/23 1305            Vitals/Pain Today's Vitals   09/16/23 1051 09/16/23 1059  BP: 123/65   Pulse: 73   Temp: (!) 97.4 F (36.3 C)   TempSrc: Oral   SpO2: 100%   Weight: 71.2 kg   Height: 5\' 6"  (1.676 m)   PainSc:  8     Isolation Precautions No active isolations  Medications Medications  acyclovir (ZOVIRAX) 710 mg in dextrose 5 % 250 mL IVPB (has no administration in time range)  lactated ringers infusion (has no administration in time range)    Mobility walks with person assist     Focused Assessments Herpes zoster with other complication   R Recommendations: See Admitting Provider Note  Report given to:   Additional Notes: .

## 2023-09-16 NOTE — Progress Notes (Signed)
Pharmacy Antibiotic Note  Fernando Lee is a 77 y.o. male admitted on 09/16/2023 with  herpes zoster .  Pharmacy has been consulted for IV acyclovir dosing.  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 He presented to the ED with an itchy rash on his thigh and penis that he felt to be due to bug bites while working outside this past weekend. The patient now endorses difficulty urinating and has no history of urinary retention in the past. WBC low at 3.7 last week and now slightly improved to 4.5. Due to concern for possible visceral involvement with urinary retention and previously low WBC, will start IV acyclovir.   Plan: Start IV acyclovir 10mg /kg q8hrs Start LR @125  ml/hr Monitor BMP x3 days due to nephrotoxicity with IV acyclovir Monitor overall clinical picture and de-escalate as able  Height: 5\' 6"  (167.6 cm) Weight: 71.2 kg (157 lb) IBW/kg (Calculated) : 63.8  Temp (24hrs), Avg:97.4 F (36.3 C), Min:97.4 F (36.3 C), Max:97.4 F (36.3 C)  Recent Labs  Lab 09/14/23 0737 09/16/23 1143  WBC 3.7* 4.5  CREATININE  --  0.87    Estimated Creatinine Clearance: 62.1 mL/min (by C-G formula based on SCr of 0.87 mg/dL).    No Known Allergies  Antimicrobials this admission: 11/29 IV acyclovir >>   Dose adjustments this admission: N/A  Microbiology results: None   Thank you for allowing pharmacy to be a part of this patient's care.  Cherylin Mylar, PharmD Clinical Pharmacist  11/29/202412:56 PM

## 2023-09-16 NOTE — ED Provider Notes (Signed)
[place] EMERGENCY DEPARTMENT AT [facility] Provider Note   CSN: [id number] Arrival date & time: 09/16/23  1046     History  Chief Complaint  Patient presents with   Urinary Retention    Fernando Lee is a 77 y.o. male.  Patient to ED with c/o difficulty urinating that started today. He reports rash in the area he believes to be related to working in the yard raking leaves, referring to them as jiggers that cause itching. He also has a different type of rash to the right buttock that neither itches or causes pain. No history of urinary retention in the past. He is taking Alfuzosin for control of urination at night and has been on this for over a year with no recent medication changes. No fever, abdominal pain, vomiting.   The history is provided by the patient. No language interpreter was used.       Home Medications Prior to Admission medications   Medication Sig Start Date End Date Taking? Authorizing Provider  ALFUZOSIN HCL ER PO Take 1 tablet by mouth daily after supper. 09/02/22   [provider]  aspirin EC 81 MG tablet Take 81 mg by mouth daily. Swallow whole.    [provider]  B Complex Vitamins (B COMPLEX PO) Take 1 tablet by mouth daily.    [provider]  Cholecalciferol (VITAMIN D3) 50 MCG (2000 UT) TABS Take 2,000 Units by mouth daily.    [provider]  docusate sodium (COLACE) 100 MG capsule Take 100 mg by mouth 2 (two) times daily.    [provider]  doxycycline (VIBRAMYCIN) 100 MG capsule Take 1 capsule (100 mg total) by mouth 2 (two) times daily. 04/23/23   Roemhildt, Lorin T, PA-C  FIBER PO Take 1 capsule by mouth daily. Unknown strength    [provider]  fish oil-omega-3 fatty acids 1000 MG capsule Take 1 g by mouth 2 (two) times daily. Strength 300mg /1500mg     [provider]  Flaxseed, Linseed, (FLAXSEED OIL) 1000 MG CAPS Take 1,000 mg by mouth daily.    [provider]   furosemide (LASIX) 20 MG tablet Take 1 tablet by mouth once daily 08/29/23   Johney Maine, MD  GLUCOSAMINE CHONDROITIN MSM PO Take 1 tablet by mouth daily. Strength 1500/1500    [provider]  Misc Natural Products (PROSTATE SUPPORT PO) Take 1 capsule by mouth daily. Unknown strength    [provider]  Multiple Vitamin (MULTIVITAMIN) tablet Take 1 tablet by mouth daily. Unknown strength    [provider]  pacritinib citrate (VONJO) 100 MG capsule Take 1 capsule (100 mg total) by mouth 2 (two) times daily. 09/09/23   Johney Maine, MD  potassium chloride SA (KLOR-CON M) 20 MEQ tablet Take 1 tablet (20 mEq total) by mouth 2 (two) times daily. Take while on lasix 09/08/23   Johney Maine, MD  psyllium (METAMUCIL) 58.6 % powder Take 1 packet by mouth daily.    [provider]  senna-docusate (SENNA S) 8.6-50 MG tablet Take 2 tablets by mouth at bedtime. 09/24/22   Johney Maine, MD  traMADol (ULTRAM) 50 MG tablet Take 50 mg by mouth as needed for moderate pain or severe pain.    [provider]  Turmeric (QC TUMERIC COMPLEX PO) Take 750 mg by mouth daily.    [provider]  vitamin C (ASCORBIC ACID) 250 MG tablet Take 500 mg by mouth daily.  [provider]      Allergies    Patient has no known allergies.    Review of Systems   Review of Systems  Physical Exam Updated Vital Signs BP 123/65 (BP Location: Right Arm)   Pulse 73   Temp (!) 97.4 F (36.3 C) (Oral)   Ht 5\' 6"  (1.676 m)   Wt 71.2 kg   SpO2 100%   BMI 25.34 kg/m  Physical Exam Vitals and nursing note reviewed.  Constitutional:      Appearance: Normal appearance.  Cardiovascular:     Rate and Rhythm: Normal rate.  Pulmonary:     Effort: Pulmonary effort is normal.  Abdominal:     General: There is no distension.     Palpations: Abdomen is soft.  Genitourinary:    Comments: Rash to scrotum and penis, including glans, that  is red, non-raised, non-pustular. No penile or scrotal swelling. Foreskin retracts easily. No redness.  Skin:    Comments: Erythematous rash covering right buttock that is confluent with sporadic shallow ulcerations. No drainage.   Neurological:     Mental Status: He is alert.     ED Results / Procedures / Treatments   Labs (all labs ordered are listed, but only abnormal results are displayed) Labs Reviewed  BASIC METABOLIC PANEL - Abnormal; Notable for the following components:      Result Value   Glucose, Bld 133 (*)    BUN 26 (*)    Calcium 8.8 (*)    All other components within normal limits  CBC - Abnormal; Notable for the following components:   RBC 2.66 (*)    Hemoglobin 7.8 (*)    HCT 24.1 (*)    Platelets 102 (*)    All other components within normal limits  URINALYSIS, ROUTINE W REFLEX MICROSCOPIC   Results for orders placed or performed during the hospital encounter of 09/16/23  Urinalysis, Routine w reflex microscopic -Urine, Clean Catch  Result Value Ref Range   Color, Urine YELLOW YELLOW   APPearance CLEAR CLEAR   Specific Gravity, Urine 1.019 1.005 - 1.030   pH 5.0 5.0 - 8.0   Glucose, UA NEGATIVE NEGATIVE mg/dL   Hgb urine dipstick NEGATIVE NEGATIVE   Bilirubin Urine NEGATIVE NEGATIVE   Ketones, ur NEGATIVE NEGATIVE mg/dL   Protein, ur NEGATIVE NEGATIVE mg/dL   Nitrite NEGATIVE NEGATIVE   Leukocytes,Ua NEGATIVE NEGATIVE  Basic metabolic panel  Result Value Ref Range   Sodium 136 135 - 145 mmol/L   Potassium 4.0 3.5 - 5.1 mmol/L   Chloride 103 98 - 111 mmol/L   CO2 26 22 - 32 mmol/L   Glucose, Bld 133 (H) 70 - 99 mg/dL   BUN 26 (H) 8 - 23 mg/dL   Creatinine, Ser 6.44 0.61 - 1.24 mg/dL   Calcium 8.8 (L) 8.9 - 10.3 mg/dL   GFR, Estimated >03 >47 mL/min   Anion gap 7 5 - 15  CBC  Result Value Ref Range   WBC 4.5 4.0 - 10.5 K/uL   RBC 2.66 (L) 4.22 - 5.81 MIL/uL   Hemoglobin 7.8 (L) 13.0 - 17.0 g/dL   HCT 42.5 (L) 95.6 - 38.7 %   MCV 90.6 80.0 -  100.0 fL   MCH 29.3 26.0 - 34.0 pg   MCHC 32.4 30.0 - 36.0 g/dL   RDW 56.4 33.2 - 95.1 %   Platelets 102 (L) 150 - 400 K/uL   nRBC 0.0 0.0 - 0.2 %    EKG  None  Radiology No results found.  Procedures Procedures    Medications Ordered in ED Medications  acyclovir (ZOVIRAX) 710 mg in dextrose 5 % 250 mL IVPB (has no administration in time range)  lactated ringers infusion (has no administration in time range)    ED Course/ Medical Decision Making/ A&P                                 Medical Decision Making This patient presents to the ED for concern of urinary retention, this involves an extensive number of treatment options, and is a complaint that carries with it a high risk of complications and morbidity.  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  The pertinent results include:  Urine without infection, no electrolyte abnormalities, Hgb 7.8.    Imaging Studies ordered:  I ordered imaging studies including bladder scan, per RN 500 cc in bladder Foley catheter inserted for treatment of retention.    Medicines ordered and prescription drug management:  I ordered medication including acyclovir per pharmacy protocol  for treatment shingles   Critical Interventions:  Foley catheter for urinary retention   Consultations Obtained:  I requested consultation with the ID, Dr. Thedore Mins,  and discussed lab and imaging findings as well as pertinent plan - they recommend: IV Acyclovir given immunocompromised condition and current shingles of dermatomes S2, S3 Consult to oncology, Dr. Arlana Pouch. Labs reviewed and no aggressive  therapy needed from oncology standpoint. He will see the patient in consult to coordinate care.    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 Rosalia Hammers, ED attending Urinary retention likely related to shingles Discussed with ID - requires IV Acyclovir per pharmacy Discussed with oncology - no additional interventions at this time - they will provide consult.   Social Determinants of Health:  Supportive social structure   Disposition:  After consideration of the diagnostic results and the patients response to treatment, I feel that the patient would benefit from Admission to continue IV Acyclovir for treatment of shingles in immunocompromised state.  Discussed with Dr. Robb Matar who accepts for admission   Amount and/or Complexity of Data Reviewed Labs: ordered.  Risk Prescription drug management.           Final Clinical Impression(s) / ED Diagnoses Final diagnoses:  Urinary retention  Herpes zoster with other complication  Immunocompromised [facility])    Rx / DC Orders ED Discharge Orders     None         Elpidio Anis, PA-C 09/16/23 1313    Margarita Grizzle, MD 09/16/23 1521    Margarita Grizzle, MD 09/16/23 1525

## 2023-09-17 DIAGNOSIS — L308 Other specified dermatitis: Secondary | ICD-10-CM | POA: Diagnosis not present

## 2023-09-17 DIAGNOSIS — R339 Retention of urine, unspecified: Secondary | ICD-10-CM | POA: Diagnosis not present

## 2023-09-17 DIAGNOSIS — D469 Myelodysplastic syndrome, unspecified: Secondary | ICD-10-CM | POA: Diagnosis not present

## 2023-09-17 DIAGNOSIS — B028 Zoster with other complications: Secondary | ICD-10-CM | POA: Diagnosis not present

## 2023-09-17 LAB — COMPREHENSIVE METABOLIC PANEL
ALT: 46 U/L — ABNORMAL HIGH (ref 0–44)
AST: 24 U/L (ref 15–41)
Albumin: 3.4 g/dL — ABNORMAL LOW (ref 3.5–5.0)
Alkaline Phosphatase: 30 U/L — ABNORMAL LOW (ref 38–126)
Anion gap: 7 (ref 5–15)
BUN: 22 mg/dL (ref 8–23)
CO2: 26 mmol/L (ref 22–32)
Calcium: 8.6 mg/dL — ABNORMAL LOW (ref 8.9–10.3)
Chloride: 103 mmol/L (ref 98–111)
Creatinine, Ser: 0.97 mg/dL (ref 0.61–1.24)
GFR, Estimated: >60 mL/min (ref >60)
Glucose, Bld: 117 mg/dL — ABNORMAL HIGH (ref 70–99)
Potassium: 4.1 mmol/L (ref 3.5–5.1)
Sodium: 136 mmol/L (ref 135–145)
Total Bilirubin: 1 mg/dL (ref <1.2)
Total Protein: 5.4 g/dL — ABNORMAL LOW (ref 6.5–8.1)

## 2023-09-17 LAB — CBC
HCT: 19.9 % — ABNORMAL LOW (ref 39.0–52.0)
Hemoglobin: 6.7 g/dL — CL (ref 13.0–17.0)
MCH: 30 pg (ref 26.0–34.0)
MCHC: 33.7 g/dL (ref 30.0–36.0)
MCV: 89.2 fL (ref 80.0–100.0)
Platelets: 90 10*3/uL — ABNORMAL LOW (ref 150–400)
RBC: 2.23 MIL/uL — ABNORMAL LOW (ref 4.22–5.81)
RDW: 13.6 % (ref 11.5–15.5)
WBC: 4.4 10*3/uL (ref 4.0–10.5)
nRBC: 0 % (ref 0.0–0.2)

## 2023-09-17 LAB — HEMOGLOBIN AND HEMATOCRIT, BLOOD
HCT: 22.2 % — ABNORMAL LOW (ref 39.0–52.0)
Hemoglobin: 7.6 g/dL — ABNORMAL LOW (ref 13.0–17.0)

## 2023-09-17 LAB — PREPARE RBC (CROSSMATCH)

## 2023-09-17 MED ORDER — VITAMIN D 25 MCG (1000 UNIT) PO TABS
2000.0000 [IU] | ORAL_TABLET | Freq: Every day | ORAL | Status: DC
  Administered 2023-09-17 – 2023-09-20 (×4): 2000 [IU] via ORAL
  Filled 2023-09-17 (×4): qty 2

## 2023-09-17 MED ORDER — CHLORHEXIDINE GLUCONATE CLOTH 2 % EX PADS
6.0000 | MEDICATED_PAD | Freq: Every day | CUTANEOUS | Status: DC
  Administered 2023-09-17 – 2023-09-20 (×4): 6 via TOPICAL

## 2023-09-17 MED ORDER — PHENYLEPHRINE-MINERAL OIL-PET 0.25-14-74.9 % RE OINT
1.0000 | TOPICAL_OINTMENT | Freq: Two times a day (BID) | RECTAL | Status: DC | PRN
  Administered 2023-09-17: 1 via RECTAL
  Filled 2023-09-17: qty 57

## 2023-09-17 MED ORDER — SODIUM CHLORIDE 0.9% FLUSH: 10.0000 mL | INTRAVENOUS | Status: DC | PRN

## 2023-09-17 MED ORDER — SODIUM CHLORIDE 0.9% IV SOLUTION: Freq: Once | INTRAVENOUS | Status: AC

## 2023-09-17 MED ORDER — FAMOTIDINE 20 MG PO TABS
20.0000 mg | ORAL_TABLET | Freq: Every day | ORAL | Status: DC
  Administered 2023-09-17 – 2023-09-19 (×3): 20 mg via ORAL
  Filled 2023-09-17 (×4): qty 1

## 2023-09-17 MED ORDER — ADULT MULTIVITAMIN W/MINERALS CH
1.0000 | ORAL_TABLET | Freq: Every day | ORAL | Status: DC
  Administered 2023-09-17 – 2023-09-20 (×4): 1 via ORAL
  Filled 2023-09-17 (×4): qty 1

## 2023-09-17 MED ORDER — TRAMADOL HCL 50 MG PO TABS: 50.0000 mg | ORAL_TABLET | Freq: Four times a day (QID) | ORAL | Status: DC | PRN

## 2023-09-17 MED ORDER — VITAMIN C 500 MG PO TABS
500.0000 mg | ORAL_TABLET | Freq: Every day | ORAL | Status: DC
  Administered 2023-09-17 – 2023-09-20 (×4): 500 mg via ORAL
  Filled 2023-09-17 (×4): qty 1

## 2023-09-17 MED ORDER — OMEGA-3 FATTY ACIDS 1000 MG PO CAPS: 1.0000 g | ORAL_CAPSULE | Freq: Every day | ORAL | Status: DC

## 2023-09-17 MED ORDER — B COMPLEX-C PO TABS
1.0000 | ORAL_TABLET | Freq: Every day | ORAL | Status: DC
  Administered 2023-09-17 – 2023-09-20 (×4): 1 via ORAL
  Filled 2023-09-17 (×4): qty 1

## 2023-09-17 MED ORDER — OMEGA-3-ACID ETHYL ESTERS 1 G PO CAPS
1.0000 g | ORAL_CAPSULE | Freq: Every day | ORAL | Status: DC
  Administered 2023-09-17 – 2023-09-20 (×4): 1 g via ORAL
  Filled 2023-09-17 (×4): qty 1

## 2023-09-17 MED ORDER — B COMPLEX PO CAPS: ORAL_CAPSULE | Freq: Every day | ORAL | Status: DC

## 2023-09-17 NOTE — Plan of Care (Signed)
  Problem: Clinical Measurements: Goal: Will remain free from infection Outcome: Progressing   Problem: Pain Management: Goal: General experience of comfort will improve Outcome: Progressing   Problem: Skin Integrity: Goal: Risk for impaired skin integrity will decrease Outcome: Progressing

## 2023-09-17 NOTE — Progress Notes (Signed)
PT Cancellation Note  Patient Details Name: Fernando Lee MRN: [id number] DOB: 05/11/1946   Cancelled Treatment:    Reason Eval/Treat Not Completed: Medical issues which prohibited therapy--low hgb-transfusion. will hold PT and check back as schedule allows.    Faye Ramsay, PT Acute Rehabilitation  Office: [phone]

## 2023-09-17 NOTE — Progress Notes (Signed)
Date and time results received: 09/17/23 n (use smartphrase ".now" to insert current time)  Test: Hgb Critical Value: 6.7 mg/dL  Name of Provider Notified: Anthoney Harada, NP  Orders Received? Or Actions Taken?:  Awaiting orders.

## 2023-09-17 NOTE — Plan of Care (Signed)
  Problem: Education: Goal: Knowledge of General Education information will improve Description: Including pain rating scale, medication(s)/side effects and non-pharmacologic comfort measures Outcome: Progressing   Problem: Health Behavior/Discharge Planning: Goal: Ability to manage health-related needs will improve Outcome: Progressing   Problem: Clinical Measurements: Goal: Ability to maintain clinical measurements within normal limits will improve Outcome: Progressing Goal: Will remain free from infection Outcome: Progressing Goal: Diagnostic test results will improve Outcome: Progressing Goal: Respiratory complications will improve Outcome: Progressing Goal: Cardiovascular complication will be avoided Outcome: Progressing   Problem: Activity: Goal: Risk for activity intolerance will decrease Outcome: Adequate for Discharge   Problem: Nutrition: Goal: Adequate nutrition will be maintained Outcome: Completed/Met   Problem: Coping: Goal: Level of anxiety will decrease Outcome: Progressing   Problem: Elimination: Goal: Will not experience complications related to bowel motility Outcome: Progressing Goal: Will not experience complications related to urinary retention Outcome: Progressing   Problem: Pain Management: Goal: General experience of comfort will improve Outcome: Progressing   Problem: Safety: Goal: Ability to remain free from injury will improve Outcome: Progressing   Problem: Skin Integrity: Goal: Risk for impaired skin integrity will decrease Outcome: Progressing

## 2023-09-17 NOTE — Consult Note (Signed)
[facility]    Date of Admission:  09/16/2023   Total days of inpatient antibiotics 0        Reason for Consult: Cof shingle    Principal Problem:   Herpes zoster dermatitis Active Problems:   Gastroesophageal reflux disease without esophagitis   Obstructive sleep apnea   Impaired fasting glucose   MDS (myelodysplastic syndrome) (HCC)   Anemia   Acute urinary retention   Assessment: 80 YM admitted with:   #Gluteal/genital lesion consistent with shingles in immunosuppressed pt #MDS/MPN on parictinib #Urinary retention suspected 2/2 #1 SP foley - Patient states that he had lesions that are painful developed on his buttock right side on Saturday and had spread to underneath and towards his penis.  He suspects they may be inside his meatus as he is having urinary retention, denies dysuria.  Denies any history of STIs.  Does not recall if he has had a cold sore in the past. - I discussed with patient that he is on baricitinib which will need to be held, I will try to reach out to Dr. Candise Che oncology to discuss plan of holding drug.  Would like patient to get at least 24 to 48 hours of IV treatment monitor for clinical progression. Recommendations:  -start acyclovir IV -HSV/VZV surface PCR -RPR and GC urine -Hold paricitinib for now, will reach out to heme onc -Monitor clinically -Airborne precaution as pt on immunosuppression Microbiology:   Antibiotics: 11/29-   HPI: Fernando Lee is a 77 y.o. 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 Candise Che admitted for possible shingles.  Patient states that last Saturday he had a few lesions on his right buttock that have spread towards his penis and suspects its inside his penis.  He notes that  he has had urinary retention as well.  On arrival patient was afebrile.  ID called for evaluation.   Review of Systems: Review of Systems  All other systems reviewed and are negative.   Past Medical History:  Diagnosis Date   Arthritis of right knee    Cervicalgia    Chest discomfort    normal stress echo   Chest pain    Colon polyps    Dyslipidemia    Fluttering sensation of heart    GERD without esophagitis    Hematochezia    Hyperglycemia    Hyperlipidemia    Internal hemorrhoids    Kidney stones    Male erectile dysfunction, unspecified    Mixed dyslipidemia    Mixed hyperlipidemia    Ocular migraine    PAC (premature atrial contraction)    Personal history of colonic polyps    Prediabetes    Residual hemorrhoidal skin tags    Spleen enlarged    nov 2023 hospitalized   Unspecified hemorrhoids     Social History   Tobacco Use   Smoking status: Former    Types: Cigarettes   Smokeless tobacco: Never  Substance Use Topics   Alcohol use: Yes    Alcohol/week: 1.0 standard drink of alcohol    Types: 1 Cans of beer per week    Family History  Problem Relation Age of Onset   Stroke Brother    Congestive Heart Failure Brother    Scheduled Meds:  sodium chloride   Intravenous Once  alfuzosin  10 mg Oral Q breakfast   Chlorhexidine Gluconate Cloth  6 each Topical Daily   docusate sodium  100 mg Oral BID   furosemide  20 mg Oral Daily   potassium chloride SA  10 mEq Oral Daily   Continuous Infusions:  acyclovir 700 mg (09/17/23 0536)   lactated ringers 125 mL/hr at 09/17/23 0027   PRN Meds:.acetaminophen **OR** acetaminophen, ondansetron **OR** ondansetron (ZOFRAN) IV, phenylephrine-shark liver oil-mineral oil-petrolatum, sodium chloride flush No Known Allergies  OBJECTIVE: Blood pressure (!) 105/52, pulse 69, temperature 98.1 F (36.7 C), resp. rate 18, height 5\' 6"  (1.676 m), weight 71.2 kg, SpO2 94%.  Physical Exam Constitutional:      General: He  is not in acute distress.    Appearance: He is normal weight. He is not toxic-appearing.  HENT:     Head: Normocephalic and atraumatic.     Right Ear: External ear normal.     Left Ear: External ear normal.     Nose: No congestion or rhinorrhea.     Mouth/Throat:     Mouth: Mucous membranes are moist.     Pharynx: Oropharynx is clear.  Eyes:     Extraocular Movements: Extraocular movements intact.     Conjunctiva/sclera: Conjunctivae normal.     Pupils: Pupils are equal, round, and reactive to light.  Cardiovascular:     Rate and Rhythm: Normal rate and regular rhythm.     Heart sounds: No murmur heard.    No friction rub. No gallop.  Pulmonary:     Effort: Pulmonary effort is normal.     Breath sounds: Normal breath sounds.  Abdominal:     General: Abdomen is flat. Bowel sounds are normal.     Palpations: Abdomen is soft.  Musculoskeletal:        General: No swelling. Normal range of motion.     Cervical back: Normal range of motion and neck supple.  Skin:    General: Skin is warm and dry.  Neurological:     General: No focal deficit present.     Mental Status: He is oriented to person, place, and time.  Psychiatric:        Mood and Affect: Mood normal.     Lab Results Lab Results  Component Value Date   WBC 4.4 09/17/2023   HGB 6.7 (LL) 09/17/2023   HCT 19.9 (L) 09/17/2023   MCV 89.2 09/17/2023   PLT 90 (L) 09/17/2023    Lab Results  Component Value Date   CREATININE 0.97 09/17/2023   BUN 22 09/17/2023   NA 136 09/17/2023   K 4.1 09/17/2023   CL 103 09/17/2023   CO2 26 09/17/2023    Lab Results  Component Value Date   ALT 46 (H) 09/17/2023   AST 24 09/17/2023   ALKPHOS 30 (L) 09/17/2023   BILITOT 1.0 09/17/2023       Danelle Earthly, MD [facility] 09/17/2023, 7:32 AM I have personally spent 82 minutes involved in face-to-face and non-face-to-face activities for this patient on the day of the  visit. Professional time spent includes the following activities: Preparing to see the patient (review of tests), Obtaining and/or reviewing separately obtained history (admission/discharge record), Performing a medically appropriate examination and/or evaluation , Ordering medications/tests/procedures, referring and communicating with other health care professionals, Documenting clinical information in the EMR, Independently interpreting results (not separately reported), Communicating results to the patient/family/caregiver, Counseling and educating the patient/family/caregiver and Care coordination (  not separately reported).

## 2023-09-17 NOTE — Progress Notes (Signed)
PROGRESS NOTE    Fernando Lee  ONG:[id number] DOB: 11-27-1945 DOA: 09/16/2023 PCP: Jackelyn Poling, DO   Brief Narrative: 77 year old with past medical history significant for right knee osteoarthritis, chest discomfort, colon polyps, hyperlipidemia, palpitation, PAC, GERD, hemorrhoids, splenomegaly, prediabetes, hyperlipidemia, nephrolithiasis, ocular migraines who presents to the ED with urinary retention and skin lesions on his right thigh and penis.  He also has a rash in his right buttock.   Assessment & Plan:   Principal Problem:   Herpes zoster dermatitis Active Problems:   MDS (myelodysplastic syndrome) (HCC)   Gastroesophageal reflux disease without esophagitis   Obstructive sleep apnea   Impaired fasting glucose   Anemia   Acute urinary retention   1-Herpes zoster dermatitis: -Continue with acyclovir -Continue IV fluids -Airborne due to immunocompromise status.  -Hold Paracitinib  Acute urinary retention: -Foley catheter placed 11/29 -Continue with alfuzosin  Anemia: In the setting of MDS.  Present with a hemoglobin of 7.8 down to 6.7. -One unit Pack of red blood cell transfusion order  MDS: -Continue to hold Vonjo (Pacritinib)  due to active infection Oncology consulted.   GERD: -Pepcid.   Obstructive sleep apnea:  -Continue CPAP at bedtime  Prediabetes: -Diet controlled  Mild transaminases: -Monitor      Estimated body mass index is 25.34 kg/m as calculated from the following:   Height as of this encounter: 5\' 6"  (1.676 m).   Weight as of this encounter: 71.2 kg.   DVT prophylaxis: SCD Code Status: Full code Family Communication: Wife at bedside Disposition Plan:  Status is: Inpatient Remains inpatient appropriate because: management of Zoster     Consultants:  ID  Procedures:  None  Antimicrobials:    Subjective: He is alert, report blister in his penis, think his difficulty urinating started with the rash,.     Objective: Vitals:   09/16/23 1730 09/16/23 2059 09/17/23 0234 09/17/23 0603  BP: (!) 105/48 (!) 105/53 (!) 105/49 (!) 105/52  Pulse: 66 66 69 69  Resp: 15 18 16 18   Temp: 98.5 F (36.9 C) 98 F (36.7 C) 99 F (37.2 C) 98.1 F (36.7 C)  TempSrc:      SpO2: 99% 96% 95% 94%  Weight:      Height:        Intake/Output Summary (Last 24 hours) at 09/17/2023 0738 Last data filed at 09/17/2023 0606 Gross per 24 hour  Intake 1559.67 ml  Output 1475 ml  Net 84.67 ml   Filed Weights   09/16/23 1051  Weight: 71.2 kg    Examination:  General exam: Appears calm and comfortable  Respiratory system: Clear to auscultation. Respiratory effort normal. Cardiovascular system: S1 & S2 heard, RRR. No JVD, murmurs, rubs, gallops or clicks. No pedal edema. Gastrointestinal system: Abdomen is nondistended, soft and nontender. No organomegaly or masses felt. Normal bowel sounds heard. Central nervous system: Alert and oriented. No focal neurological deficits. Extremities: Symmetric 5 x 5 power. Skin: rash buttock with multiples small blister, small blister penis  Data Reviewed: I have personally reviewed following labs and imaging studies  CBC: Recent Labs  Lab 09/14/23 0737 09/16/23 1143 09/17/23 0347  WBC 3.7* 4.5 4.4  NEUTROABS 2.5  --   --   HGB 7.5* 7.8* 6.7*  HCT 22.5* 24.1* 19.9*  MCV 88.9 90.6 89.2  PLT 77* 102* 90*   Basic Metabolic Panel: Recent Labs  Lab 09/16/23 1143 09/17/23 0347  NA 136 136  K 4.0 4.1  CL 103 103  CO2  26 26  GLUCOSE 133* 117*  BUN 26* 22  CREATININE 0.87 0.97  CALCIUM 8.8* 8.6*   GFR: Estimated Creatinine Clearance: 55.7 mL/min (by C-G formula based on SCr of 0.97 mg/dL). Liver Function Tests: Recent Labs  Lab 09/17/23 0347  AST 24  ALT 46*  ALKPHOS 30*  BILITOT 1.0  PROT 5.4*  ALBUMIN 3.4*   No results for input(s): "LIPASE", "AMYLASE" in the last 168 hours. No results for input(s): "AMMONIA" in the last 168  hours. Coagulation Profile: No results for input(s): "INR", "PROTIME" in the last 168 hours. Cardiac Enzymes: No results for input(s): "CKTOTAL", "CKMB", "CKMBINDEX", "TROPONINI" in the last 168 hours. BNP (last 3 results) No results for input(s): "PROBNP" in the last 8760 hours. HbA1C: No results for input(s): "HGBA1C" in the last 72 hours. CBG: No results for input(s): "GLUCAP" in the last 168 hours. Lipid Profile: No results for input(s): "CHOL", "HDL", "LDLCALC", "TRIG", "CHOLHDL", "LDLDIRECT" in the last 72 hours. Thyroid Function Tests: No results for input(s): "TSH", "T4TOTAL", "FREET4", "T3FREE", "THYROIDAB" in the last 72 hours. Anemia Panel: No results for input(s): "VITAMINB12", "FOLATE", "FERRITIN", "TIBC", "IRON", "RETICCTPCT" in the last 72 hours. Sepsis Labs: No results for input(s): "PROCALCITON", "LATICACIDVEN" in the last 168 hours.  No results found for this or any previous visit (from the past 240 hour(s)).       Radiology Studies: No results found.      Scheduled Meds:  sodium chloride   Intravenous Once   alfuzosin  10 mg Oral Q breakfast   Chlorhexidine Gluconate Cloth  6 each Topical Daily   docusate sodium  100 mg Oral BID   furosemide  20 mg Oral Daily   potassium chloride SA  10 mEq Oral Daily   Continuous Infusions:  acyclovir 700 mg (09/17/23 0536)   lactated ringers 125 mL/hr at 09/17/23 0027     LOS: 1 day    Time spent: 35 minutes    Khaleelah Yowell A Ianmichael Amescua, MD [facility]   If 7PM-7AM, please contact night-coverage [email or web address]  09/17/2023, 7:38 AM

## 2023-09-17 NOTE — Consult Note (Signed)
[facility]  Telephone:([phone]) [phone]   HEMATOLOGY/ONCOLOGY IN-PATIENT CONSULTATION NOTE   PATIENT NAME: Fernando Lee   MR#: [id number] DOB: September 23, 1946 CSN#: [id number]   DATE OF SERVICE: 09/17/2023  Requesting Physician: [facility]   Patient Care Team: Jackelyn Poling, DO as PCP - General (Family Medicine)  REASON FOR CONSULTATION:  Management decisions in a patient with JAK2 positive MDS/MPN  HISTORY OF PRESENT ILLNESS  Fernando Lee is a 77 y.o. gentleman who is followed in our clinic by Dr. 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   He presented to the ED on 09/16/2023 with complaints of urinary retention and  "bug bites" on his right thigh and penis area while he was raking leaves this week.  He also has a right buttock area rash as well.  He developed urinary retention after the night prior to arrival despite taking alfuzosin. He denied fever, chills, rhinorrhea, sore throat, wheezing or hemoptysis.  No chest pain, palpitations, diaphoresis, PND, orthopnea or pitting edema of the lower extremities.  No abdominal pain, nausea, emesis, diarrhea, constipation, melena or hematochezia.  No flank pain, dysuria.   He was found to have herpes zoster dermatitis, acute urinary retention and was admitted to hospital for further management.  He was started on acyclovir.  Pacritinib was held because of active infection.  We were consulted today for any additional recommendations.  Patient seen and evaluated.  Reports discomfort with Foley catheter in place.  Otherwise denies any new complaints compared to admission.  MEDICAL HISTORY Past Medical History:  Diagnosis Date   Arthritis of right knee    Cervicalgia    Chest discomfort    normal stress echo   Chest pain    Colon polyps    Dyslipidemia    Fluttering sensation of heart    GERD  without esophagitis    Hematochezia    Hyperglycemia    Hyperlipidemia    Internal hemorrhoids    Kidney stones    Male erectile dysfunction, unspecified    Mixed dyslipidemia    Mixed hyperlipidemia    Ocular migraine    PAC (premature atrial contraction)    Personal history of colonic polyps    Prediabetes    Residual hemorrhoidal skin tags    Spleen enlarged    nov 2023 hospitalized   Unspecified hemorrhoids      SURGICAL HISTORY Past Surgical History:  Procedure Laterality Date   BONE MARROW BIOPSY     IR IMAGING GUIDED PORT INSERTION  09/14/2022   KIDNEY STONE SURGERY     retrieval     ALLERGIES  No Known Allergies  FAMILY HISTORY  Family History  Problem Relation Age of Onset   Stroke Brother    Congestive Heart Failure Brother      SOCIAL HISTORY   Social History   Socioeconomic History   Marital status: Married    Spouse name: Not on file   Number of children: Not on file   Years of education: Not on file   Highest education level: Not on file  Occupational History   Not on file  Tobacco Use   Smoking status: Former    Types: Cigarettes   Smokeless tobacco: Never  Substance and Sexual Activity   Alcohol use: Yes    Alcohol/week: 1.0 standard drink of alcohol    Types: 1 Cans of beer per week  Drug use: Not on file   Sexual activity: Not on file  Other Topics Concern   Not on file  Social History Narrative   Not on file   Social Determinants of Health   Financial Resource Strain: Not on file  Food Insecurity: Patient Declined (09/16/2023)   Hunger Vital Sign    Worried About Running Out of Food in the Last Year: Patient declined    Ran Out of Food in the Last Year: Patient declined  Transportation Needs: Patient Declined (09/16/2023)   PRAPARE - Administrator, Civil Service (Medical): Patient declined    Lack of Transportation (Non-Medical): Patient declined  Physical Activity: Not on file  Stress: Not on file  Social  Connections: Not on file  Intimate Partner Violence: Patient Declined (09/16/2023)   Humiliation, Afraid, Rape, and Kick questionnaire    Fear of Current or Ex-Partner: Patient declined    Emotionally Abused: Patient declined    Physically Abused: Patient declined    Sexually Abused: Patient declined    CURRENT MEDICATIONS   Current Outpatient Medications  Medication Instructions   alfuzosin (UROXATRAL) 10 mg, Oral, Daily with breakfast   B Complex Vitamins (B COMPLEX PO) 1 tablet, Oral, Daily   docusate sodium (COLACE) 100 mg, Oral, 2 times daily   fish oil-omega-3 fatty acids 1 g, Oral, Daily   furosemide (LASIX) 20 mg, Oral, Daily   GLUCOSAMINE CHONDROITIN MSM PO 1 tablet, Oral, Daily   ibuprofen (ADVIL) 100-200 mg, Oral, Every 6 hours PRN   Multiple Vitamin (MULTIVITAMIN) tablet 1 tablet, Oral, Daily with breakfast   potassium chloride SA (KLOR-CON M) 20 MEQ tablet 20 mEq, Oral, 2 times daily, Take while on lasix   senna-docusate (SENNA S) 8.6-50 MG tablet 2 tablets, Oral, Daily at bedtime   traMADol (ULTRAM) 50 mg, Oral, Every 6 hours PRN   Turmeric (QC TUMERIC COMPLEX PO) 750 mg, Oral, Daily   vitamin C (ASCORBIC ACID) 500 mg, Oral, Daily   Vitamin D3 2,000 Units, Oral, Daily   Vonjo 100 mg, Oral, 2 times daily     REVIEW OF SYSTEMS   Review of Systems - Oncology  All other pertinent review of systems is negative except as mentioned above in HPI  PHYSICAL EXAMINATION  ECOG PERFORMANCE STATUS: 1 - Symptomatic but completely ambulatory  Vitals:   09/17/23 0603 09/17/23 1017  BP: (!) 105/52 (!) 114/43  Pulse: 69 70  Resp: 18 16  Temp: 98.1 F (36.7 C) 98.5 F (36.9 C)  SpO2: 94% 97%   Filed Weights   09/16/23 1051  Weight: 157 lb (71.2 kg)    Physical Exam Constitutional:      General: He is not in acute distress.    Appearance: Normal appearance.  HENT:     Head: Normocephalic and atraumatic.  Eyes:     General: No scleral icterus.     Conjunctiva/sclera: Conjunctivae normal.  Cardiovascular:     Rate and Rhythm: Normal rate and regular rhythm.     Heart sounds: Normal heart sounds.  Pulmonary:     Effort: Pulmonary effort is normal.     Breath sounds: Normal breath sounds.  Abdominal:     General: There is no distension.  Genitourinary:    Comments: Foley catheter in place Musculoskeletal:     Right lower leg: No edema.     Left lower leg: No edema.  Skin:    Comments: Rash on buttocks- small blisters.   Neurological:  General: No focal deficit present.     Mental Status: He is alert and oriented to person, place, and time.  Psychiatric:        Mood and Affect: Mood normal.        Behavior: Behavior normal.        Thought Content: Thought content normal.     LABORATORY DATA:   I have reviewed the data as listed  Results for orders placed or performed during the hospital encounter of 09/16/23 (from the past 24 hour(s))  Urinalysis, Routine w reflex microscopic -Urine, Clean Catch   Collection Time: 09/16/23 11:01 AM  Result Value Ref Range   Color, Urine YELLOW YELLOW   APPearance CLEAR CLEAR   Specific Gravity, Urine 1.019 1.005 - 1.030   pH 5.0 5.0 - 8.0   Glucose, UA NEGATIVE NEGATIVE mg/dL   Hgb urine dipstick NEGATIVE NEGATIVE   Bilirubin Urine NEGATIVE NEGATIVE   Ketones, ur NEGATIVE NEGATIVE mg/dL   Protein, ur NEGATIVE NEGATIVE mg/dL   Nitrite NEGATIVE NEGATIVE   Leukocytes,Ua NEGATIVE NEGATIVE  Basic metabolic panel   Collection Time: 09/16/23 11:43 AM  Result Value Ref Range   Sodium 136 135 - 145 mmol/L   Potassium 4.0 3.5 - 5.1 mmol/L   Chloride 103 98 - 111 mmol/L   CO2 26 22 - 32 mmol/L   Glucose, Bld 133 (H) 70 - 99 mg/dL   BUN 26 (H) 8 - 23 mg/dL   Creatinine, Ser 3.24 0.61 - 1.24 mg/dL   Calcium 8.8 (L) 8.9 - 10.3 mg/dL   GFR, Estimated >40 >10 mL/min   Anion gap 7 5 - 15  CBC   Collection Time: 09/16/23 11:43 AM  Result Value Ref Range   WBC 4.5 4.0 - 10.5 K/uL    RBC 2.66 (L) 4.22 - 5.81 MIL/uL   Hemoglobin 7.8 (L) 13.0 - 17.0 g/dL   HCT 27.2 (L) 53.6 - 64.4 %   MCV 90.6 80.0 - 100.0 fL   MCH 29.3 26.0 - 34.0 pg   MCHC 32.4 30.0 - 36.0 g/dL   RDW 03.4 74.2 - 59.5 %   Platelets 102 (L) 150 - 400 K/uL   nRBC 0.0 0.0 - 0.2 %  CBC   Collection Time: 09/17/23  3:47 AM  Result Value Ref Range   WBC 4.4 4.0 - 10.5 K/uL   RBC 2.23 (L) 4.22 - 5.81 MIL/uL   Hemoglobin 6.7 (LL) 13.0 - 17.0 g/dL   HCT 63.8 (L) 75.6 - 43.3 %   MCV 89.2 80.0 - 100.0 fL   MCH 30.0 26.0 - 34.0 pg   MCHC 33.7 30.0 - 36.0 g/dL   RDW 29.5 18.8 - 41.6 %   Platelets 90 (L) 150 - 400 K/uL   nRBC 0.0 0.0 - 0.2 %  Comprehensive metabolic panel   Collection Time: 09/17/23  3:47 AM  Result Value Ref Range   Sodium 136 135 - 145 mmol/L   Potassium 4.1 3.5 - 5.1 mmol/L   Chloride 103 98 - 111 mmol/L   CO2 26 22 - 32 mmol/L   Glucose, Bld 117 (H) 70 - 99 mg/dL   BUN 22 8 - 23 mg/dL   Creatinine, Ser 6.06 0.61 - 1.24 mg/dL   Calcium 8.6 (L) 8.9 - 10.3 mg/dL   Total Protein 5.4 (L) 6.5 - 8.1 g/dL   Albumin 3.4 (L) 3.5 - 5.0 g/dL   AST 24 15 - 41 U/L   ALT 46 (H) 0 - 44  U/L   Alkaline Phosphatase 30 (L) 38 - 126 U/L   Total Bilirubin 1.0 <1.2 mg/dL   GFR, Estimated >16 >10 mL/min   Anion gap 7 5 - 15  Type and screen [facility]   Collection Time: 09/17/23  9:40 AM  Result Value Ref Range   ABO/RH(D) PENDING    Antibody Screen PENDING    Sample Expiration      09/20/2023,2359 Performed at [facility], [address] [place], [place] [address]       ASSESSMENT & PLAN:   77 y.o. gentleman who is followed in our clinic by Dr. 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  He was admitted to hospital on 09/16/2023 after he presented with acute urinary retention and was also diagnosed with herpes zoster  dermatitis.  He was started on acyclovir.  Pacritinib was held because of active infection.  We were consulted today for any additional recommendations.  Agree with holding pacritinib for now.  Continue to monitor CBCD daily and transfuse as needed to maintain hemoglobin above 7 and platelet count above 20,000.  He received 1 unit of PRBC transfusion earlier today for hemoglobin of 6.7.  Platelet count stable at 90,000.  White count 4400.  I will update his primary oncologist Dr. Candise Che on Monday, 09/19/2023, for continued follow-up.   Rest of care as per primary team and other specialties.  Thanks for the opportunity to participate in the care of this patient. Please contact me if there are any questions.  Upon discharge, please make appointment for patient to follow up with Dr.Kale at [facility] cancer center.   Meryl Crutch, MD Medical Oncology and Hematology 09/17/2023 10:42 AM    This document was completed utilizing speech recognition software. Grammatical errors, random word insertions, pronoun errors, and incomplete sentences are an occasional consequence of this system due to software limitations, ambient noise, and hardware issues. Any formal questions or concerns about the content, text or information contained within the body of this dictation should be directly addressed to the provider for clarification.

## 2023-09-17 NOTE — Progress Notes (Signed)
    Patient Name: Fernando Lee           DOB: 1945-12-17  MRN: [id number]      Admission Date: 09/16/2023  Attending Provider: Bobette Mo, MD  Primary Diagnosis: Herpes zoster dermatitis   Level of care: Med-Surg    CROSS COVER NOTE   Date of Service   09/17/2023   [name], 77 y.o. male, was admitted on 09/16/2023 for Herpes zoster dermatitis.    HPI/Events of Note    Anemia Hemoglobin 7.8 -->  6.7.  No acute changes reported.  Hemodynamically stable. No melena, hematochezia, or other bleeding reported tonight.    Interventions/ Plan   Blood transfusion, 1 unit PRBC Recheck H&H after transfusion is completed, transfuse if HGB <7        Anthoney Harada, DNP, [employer] [facility] [place]

## 2023-09-18 DIAGNOSIS — B028 Zoster with other complications: Secondary | ICD-10-CM | POA: Diagnosis not present

## 2023-09-18 DIAGNOSIS — R339 Retention of urine, unspecified: Secondary | ICD-10-CM | POA: Diagnosis not present

## 2023-09-18 DIAGNOSIS — L308 Other specified dermatitis: Secondary | ICD-10-CM | POA: Diagnosis not present

## 2023-09-18 LAB — HSV 1/2 PCR (SURFACE)
HSV-1 DNA: NOT DETECTED
HSV-2 DNA: NOT DETECTED

## 2023-09-18 LAB — BASIC METABOLIC PANEL
Anion gap: 5 (ref 5–15)
BUN: 19 mg/dL (ref 8–23)
CO2: 26 mmol/L (ref 22–32)
Calcium: 8.3 mg/dL — ABNORMAL LOW (ref 8.9–10.3)
Chloride: 104 mmol/L (ref 98–111)
Creatinine, Ser: 0.88 mg/dL (ref 0.61–1.24)
GFR, Estimated: >60 mL/min (ref >60)
Glucose, Bld: 117 mg/dL — ABNORMAL HIGH (ref 70–99)
Potassium: 3.8 mmol/L (ref 3.5–5.1)
Sodium: 135 mmol/L (ref 135–145)

## 2023-09-18 LAB — CBC
HCT: 23 % — ABNORMAL LOW (ref 39.0–52.0)
Hemoglobin: 7.8 g/dL — ABNORMAL LOW (ref 13.0–17.0)
MCH: 30 pg (ref 26.0–34.0)
MCHC: 33.9 g/dL (ref 30.0–36.0)
MCV: 88.5 fL (ref 80.0–100.0)
Platelets: 86 10*3/uL — ABNORMAL LOW (ref 150–400)
RBC: 2.6 MIL/uL — ABNORMAL LOW (ref 4.22–5.81)
RDW: 14 % (ref 11.5–15.5)
WBC: 4.2 10*3/uL (ref 4.0–10.5)
nRBC: 0 % (ref 0.0–0.2)

## 2023-09-18 LAB — RPR: RPR Ser Ql: NONREACTIVE

## 2023-09-18 MED ORDER — LACTATED RINGERS IV SOLN: INTRAVENOUS | Status: DC

## 2023-09-18 NOTE — Evaluation (Signed)
Physical Therapy Evaluation-1x Patient Details Name: Fernando Lee MRN: [id number] DOB: 1946/06/10 Today's Date: 09/18/2023  History of Present Illness  77 yo male admitted with herpes zoster dermatitis, urinary retention. Hx of OA, cervicalgia, MDS/MPN  Clinical Impression  On eval, pt was Ind with mobility. No acute PT neesds. 1x eval. Will sign off.        If plan is discharge home, recommend the following:     Can travel by private vehicle        Equipment Recommendations None recommended by PT  Recommendations for Other Services       Functional Status Assessment Patient has not had a recent decline in their functional status     Precautions / Restrictions Restrictions Weight Bearing Restrictions: No      Mobility  Bed Mobility Overal bed mobility: Independent                  Transfers Overall transfer level: Independent                      Ambulation/Gait Ambulation/Gait assistance: Independent Gait Distance (Feet): 100 Feet Assistive device: None            Stairs            Wheelchair Mobility     Tilt Bed    Modified Rankin (Stroke Patients Only)       Balance                                             Pertinent Vitals/Pain Pain Assessment Pain Assessment: No/denies pain    Home Living Family/patient expects to be discharged to:: Private residence Living Arrangements: Spouse/significant other Available Help at Discharge: Family Type of Home: House           Home Equipment: None      Prior Function Prior Level of Function : Independent/Modified Independent                     Extremity/Trunk Assessment   Upper Extremity Assessment Upper Extremity Assessment: Overall WFL for tasks assessed    Lower Extremity Assessment Lower Extremity Assessment: Overall WFL for tasks assessed    Cervical / Trunk Assessment Cervical / Trunk Assessment: Normal  Communication    Communication Communication: No apparent difficulties  Cognition Arousal: Alert Behavior During Therapy: WFL for tasks assessed/performed Overall Cognitive Status: Within Functional Limits for tasks assessed                                          General Comments      Exercises     Assessment/Plan    PT Assessment Patient does not need any further PT services  PT Problem List         PT Treatment Interventions      PT Goals (Current goals can be found in the Care Plan section)  Acute Rehab PT Goals PT Goal Formulation: All assessment and education complete, DC therapy    Frequency       Co-evaluation               AM-PAC PT "6 Clicks" Mobility  Outcome Measure Help needed turning from your back to your side while in a  flat bed without using bedrails?: None Help needed moving from lying on your back to sitting on the side of a flat bed without using bedrails?: None Help needed moving to and from a bed to a chair (including a wheelchair)?: None Help needed standing up from a chair using your arms (e.g., wheelchair or bedside chair)?: None Help needed to walk in hospital room?: None Help needed climbing 3-5 steps with a railing? : None 6 Click Score: 24    End of Session   Activity Tolerance: Patient tolerated treatment well Patient left: in bed;with call bell/phone within reach        Time: 1125-1138 PT Time Calculation (min) (ACUTE ONLY): 13 min   Charges:   PT Evaluation $PT Eval Low Complexity: 1 Low   PT General Charges $$ ACUTE PT VISIT: 1 Visit            Faye Ramsay, PT Acute Rehabilitation  Office: [phone]

## 2023-09-18 NOTE — TOC CM/SW Note (Signed)
Transition of Care [facility]) - Inpatient Brief Assessment   Patient Details  Name: Fernando Lee MRN: [id number] Date of Birth: [birth year]  Transition of Care [facility]) CM/SW Contact:    Darleene Cleaver, LCSW Phone Number: 09/18/2023, 5:45 PM   Clinical Narrative:  Patient does not have any SDOH needs.  Patient has insurance and also has a PCP.  Transition of Care Asessment: Insurance and Status: Insurance coverage has been reviewed Patient has primary care physician: Yes Home environment has been reviewed: Yes Prior level of function:: Indep Prior/Current Home Services: No current home services Social Determinants of Health Reivew: SDOH reviewed no interventions necessary Readmission risk has been reviewed: Yes Transition of care needs: no transition of care needs at this time

## 2023-09-18 NOTE — Plan of Care (Signed)
  Problem: Activity: Goal: Risk for activity intolerance will decrease Outcome: Progressing   Problem: Elimination: Goal: Will not experience complications related to urinary retention Outcome: Progressing   Problem: Safety: Goal: Ability to remain free from injury will improve Outcome: Progressing   

## 2023-09-18 NOTE — Progress Notes (Signed)
[facility]  Date of Admission:  09/16/2023   Total days of inpatient antibiotics 1  Principal Problem:   Herpes zoster dermatitis Active Problems:   Gastroesophageal reflux disease without esophagitis   Obstructive sleep apnea   Impaired fasting glucose   MDS (myelodysplastic syndrome) (HCC)   Anemia   Urinary retention          Assessment: 52 YM admitted with:   #Gluteal/genital lesion consistent with shingles in immunosuppressed pt #MDS/MPN on parictinib #Urinary retention suspected 2/2 #1 SP foley - Patient states that he had lesions that are painful developed on his buttock right side on Saturday and had spread to underneath and towards his penis.  He suspects they may be inside his meatus as he is having urinary retention, denies dysuria.  Denies any history of STIs.  Does not recall if he has had a cold sore in the past. - I discussed with patient that he is on baricitinib which will need to be held, I will try to reach out to Dr. Candise Che oncology to discuss plan of holding drug.  Would like patient to get at least 24 to 48 hours of IV treatment monitor for clinical progression. Recommendations:  -Continue acyclovir IV.  Patient states he is starting to feel better overall.  Consider voiding trial tomorrow. -HSV/VZV surface PCR pending -RPR and GC urine pending -Hold paricitinib for now, will reach out to heme onc -Monitor clinically -Airborne precaution as pt on immunosuppression   Microbiology:   Antibiotics: Acyclovir 11/29-   SUBJECTIVE: Resting in bed.  No new complaints. Interval: Afebrile overnight.  Review of Systems: Review of Systems  All other systems reviewed and are negative.  Gluteal rash  Scheduled Meds:  alfuzosin  10 mg Oral Q breakfast   vitamin C  500 mg Oral Daily   B-complex with vitamin C  1 tablet Oral Daily   Chlorhexidine Gluconate Cloth  6 each Topical Daily   cholecalciferol  2,000 Units Oral  Daily   docusate sodium  100 mg Oral BID   famotidine  20 mg Oral Daily   multivitamin with minerals  1 tablet Oral Q breakfast   omega-3 acid ethyl esters  1 g Oral Daily   Continuous Infusions:  acyclovir 700 mg (09/18/23 0624)   PRN Meds:.acetaminophen **OR** acetaminophen, ondansetron **OR** ondansetron (ZOFRAN) IV, phenylephrine-shark liver oil-mineral oil-petrolatum, sodium chloride flush, traMADol No Known Allergies  OBJECTIVE: Vitals:   09/17/23 1339 09/17/23 1531 09/17/23 2207 09/18/23 0604  BP: (!) 102/52 (!) 108/51 116/61 112/64  Pulse: 62 66 66 65  Resp: 18 17 18 19   Temp: 98 F (36.7 C) 98.3 F (36.8 C) 98.9 F (37.2 C) 98.2 F (36.8 C)  TempSrc: Oral Oral Oral Oral  SpO2: 98% 99% 100% 95%  Weight:      Height:       Body mass index is 25.34 kg/m.  Physical Exam Constitutional:      General: He is not in acute distress.    Appearance: He is normal weight. He is not toxic-appearing.  HENT:     Head: Normocephalic and atraumatic.     Right Ear: External ear normal.     Left Ear: External ear normal.     Nose: No congestion or rhinorrhea.     Mouth/Throat:     Mouth: Mucous membranes are moist.     Pharynx: Oropharynx is clear.  Eyes:     Extraocular Movements:  Extraocular movements intact.     Conjunctiva/sclera: Conjunctivae normal.     Pupils: Pupils are equal, round, and reactive to light.  Cardiovascular:     Rate and Rhythm: Normal rate and regular rhythm.     Heart sounds: No murmur heard.    No friction rub. No gallop.  Pulmonary:     Effort: Pulmonary effort is normal.     Breath sounds: Normal breath sounds.  Abdominal:     General: Abdomen is flat. Bowel sounds are normal.     Palpations: Abdomen is soft.  Musculoskeletal:        General: No swelling. Normal range of motion.     Cervical back: Normal range of motion and neck supple.  Skin:    General: Skin is warm and dry.  Neurological:     General: No focal deficit present.      Mental Status: He is oriented to person, place, and time.  Psychiatric:        Mood and Affect: Mood normal.       Lab Results Lab Results  Component Value Date   WBC 4.2 09/18/2023   HGB 7.8 (L) 09/18/2023   HCT 23.0 (L) 09/18/2023   MCV 88.5 09/18/2023   PLT 86 (L) 09/18/2023    Lab Results  Component Value Date   CREATININE 0.88 09/18/2023   BUN 19 09/18/2023   NA 135 09/18/2023   K 3.8 09/18/2023   CL 104 09/18/2023   CO2 26 09/18/2023    Lab Results  Component Value Date   ALT 46 (H) 09/17/2023   AST 24 09/17/2023   ALKPHOS 30 (L) 09/17/2023   BILITOT 1.0 09/17/2023        Danelle Earthly, MD [facility] 09/18/2023, 8:08 AM I have personally spent involved in face-to-face and non-face-to-face activities for this patient on the day of the visit. Professional time spent includes the following activities: Preparing to see the patient (review of tests), Obtaining and/or reviewing separately obtained history (admission/discharge record), Performing a medically appropriate examination and/or evaluation , Ordering medications/tests/procedures, referring and communicating with other health care professionals, Documenting clinical information in the EMR, Independently interpreting results (not separately reported), Communicating results to the patient/family/caregiver, Counseling and educating the patient/family/caregiver and Care coordination (not separately reported).  e

## 2023-09-18 NOTE — Plan of Care (Signed)
  Problem: Coping: Goal: Level of anxiety will decrease Outcome: Progressing   Problem: Pain Management: Goal: General experience of comfort will improve Outcome: Progressing

## 2023-09-18 NOTE — Progress Notes (Signed)
PROGRESS NOTE    Fernando Lee  QMV:[id number] DOB: 29-Nov-1945 DOA: 09/16/2023 PCP: Jackelyn Poling, DO   Brief Narrative: 77 year old with past medical history significant for right knee osteoarthritis, chest discomfort, colon polyps, hyperlipidemia, palpitation, PAC, GERD, hemorrhoids, splenomegaly, prediabetes, hyperlipidemia, nephrolithiasis, ocular migraines who presents to the ED with urinary retention and skin lesions on his right thigh and penis.  He also has a rash in his right buttock.   Assessment & Plan:   Principal Problem:   Herpes zoster dermatitis Active Problems:   MDS (myelodysplastic syndrome) (HCC)   Gastroesophageal reflux disease without esophagitis   Obstructive sleep apnea   Impaired fasting glucose   Anemia   Urinary retention   1-Herpes zoster dermatitis: -Continue with acyclovir for 48 hours.  -Continue IV fluids -Airborne due to immunocompromise status.  -Hold Paracitinib  Acute urinary retention: -Foley catheter placed 11/29 -Continue with alfuzosin Could do voiding trial tomorrow.   Anemia: In the setting of MDS.  Present with a hemoglobin of 7.8 down to 6.7. -Received one unit PRBC 11/30.  MDS: -Continue to hold Vonjo (Pacritinib)  due to active infection Oncology consulted.   GERD: -Pepcid.   Obstructive sleep apnea:  -Continue CPAP at bedtime  Prediabetes: -Diet controlled  Mild transaminases: -Monitor      Estimated body mass index is 25.34 kg/m as calculated from the following:   Height as of this encounter: 5\' 6"  (1.676 m).   Weight as of this encounter: 71.2 kg.   DVT prophylaxis: SCD Code Status: Full code Family Communication: Wife at bedside 11/30 Disposition Plan:  Status is: Inpatient Remains inpatient appropriate because: management of Zoster     Consultants:  ID  Procedures:  None  Antimicrobials:    Subjective: Report no worsening pain on the rash. He never had much pain.    Objective: Vitals:   09/17/23 1339 09/17/23 1531 09/17/23 2207 09/18/23 0604  BP: (!) 102/52 (!) 108/51 116/61 112/64  Pulse: 62 66 66 65  Resp: 18 17 18 19   Temp: 98 F (36.7 C) 98.3 F (36.8 C) 98.9 F (37.2 C) 98.2 F (36.8 C)  TempSrc: Oral Oral Oral Oral  SpO2: 98% 99% 100% 95%  Weight:      Height:        Intake/Output Summary (Last 24 hours) at 09/18/2023 0654 Last data filed at 09/18/2023 2841 Gross per 24 hour  Intake 2814.83 ml  Output 1650 ml  Net 1164.83 ml   Filed Weights   09/16/23 1051  Weight: 71.2 kg    Examination:  General exam: NAD Respiratory system: CTA Cardiovascular system: S 1, S 2 RRR Gastrointestinal system: BS present, soft, nt Extremities; no edema Skin: rash buttock with multiples small blister, small blister penis  Data Reviewed: I have personally reviewed following labs and imaging studies  CBC: Recent Labs  Lab 09/14/23 0737 09/16/23 1143 09/17/23 0347 09/17/23 1730 09/18/23 0330  WBC 3.7* 4.5 4.4  --  4.2  NEUTROABS 2.5  --   --   --   --   HGB 7.5* 7.8* 6.7* 7.6* 7.8*  HCT 22.5* 24.1* 19.9* 22.2* 23.0*  MCV 88.9 90.6 89.2  --  88.5  PLT 77* 102* 90*  --  86*   Basic Metabolic Panel: Recent Labs  Lab 09/16/23 1143 09/17/23 0347 09/18/23 0330  NA 136 136 135  K 4.0 4.1 3.8  CL 103 103 104  CO2 26 26 26   GLUCOSE 133* 117* 117*  BUN 26* 22 19  CREATININE 0.87 0.97 0.88  CALCIUM 8.8* 8.6* 8.3*   GFR: Estimated Creatinine Clearance: 61.4 mL/min (by C-G formula based on SCr of 0.88 mg/dL). Liver Function Tests: Recent Labs  Lab 09/17/23 0347  AST 24  ALT 46*  ALKPHOS 30*  BILITOT 1.0  PROT 5.4*  ALBUMIN 3.4*   No results for input(s): "LIPASE", "AMYLASE" in the last 168 hours. No results for input(s): "AMMONIA" in the last 168 hours. Coagulation Profile: No results for input(s): "INR", "PROTIME" in the last 168 hours. Cardiac Enzymes: No results for input(s): "CKTOTAL", "CKMB", "CKMBINDEX",  "TROPONINI" in the last 168 hours. BNP (last 3 results) No results for input(s): "PROBNP" in the last 8760 hours. HbA1C: No results for input(s): "HGBA1C" in the last 72 hours. CBG: No results for input(s): "GLUCAP" in the last 168 hours. Lipid Profile: No results for input(s): "CHOL", "HDL", "LDLCALC", "TRIG", "CHOLHDL", "LDLDIRECT" in the last 72 hours. Thyroid Function Tests: No results for input(s): "TSH", "T4TOTAL", "FREET4", "T3FREE", "THYROIDAB" in the last 72 hours. Anemia Panel: No results for input(s): "VITAMINB12", "FOLATE", "FERRITIN", "TIBC", "IRON", "RETICCTPCT" in the last 72 hours. Sepsis Labs: No results for input(s): "PROCALCITON", "LATICACIDVEN" in the last 168 hours.  No results found for this or any previous visit (from the past 240 hour(s)).       Radiology Studies: No results found.      Scheduled Meds:  alfuzosin  10 mg Oral Q breakfast   vitamin C  500 mg Oral Daily   B-complex with vitamin C  1 tablet Oral Daily   Chlorhexidine Gluconate Cloth  6 each Topical Daily   cholecalciferol  2,000 Units Oral Daily   docusate sodium  100 mg Oral BID   famotidine  20 mg Oral Daily   multivitamin with minerals  1 tablet Oral Q breakfast   omega-3 acid ethyl esters  1 g Oral Daily   Continuous Infusions:  acyclovir 700 mg (09/18/23 0624)     LOS: 2 days    Time spent: 35 minutes    Ireene Ballowe A Carles Florea, MD [facility]   If 7PM-7AM, please contact night-coverage [email or web address]  09/18/2023, 6:54 AM

## 2023-09-19 DIAGNOSIS — L308 Other specified dermatitis: Secondary | ICD-10-CM | POA: Diagnosis not present

## 2023-09-19 DIAGNOSIS — B028 Zoster with other complications: Secondary | ICD-10-CM | POA: Diagnosis not present

## 2023-09-19 LAB — TYPE AND SCREEN
ABO/RH(D): A POS
Antibody Screen: NEGATIVE
Unit division: 0

## 2023-09-19 LAB — URINE CYTOLOGY ANCILLARY ONLY
Chlamydia: NEGATIVE
Comment: NEGATIVE
Comment: NORMAL
Neisseria Gonorrhea: NEGATIVE

## 2023-09-19 LAB — CBC
HCT: 23.9 % — ABNORMAL LOW (ref 39.0–52.0)
Hemoglobin: 7.8 g/dL — ABNORMAL LOW (ref 13.0–17.0)
MCH: 29.7 pg (ref 26.0–34.0)
MCHC: 32.6 g/dL (ref 30.0–36.0)
MCV: 90.9 fL (ref 80.0–100.0)
Platelets: 87 10*3/uL — ABNORMAL LOW (ref 150–400)
RBC: 2.63 MIL/uL — ABNORMAL LOW (ref 4.22–5.81)
RDW: 13.8 % (ref 11.5–15.5)
WBC: 4.5 10*3/uL (ref 4.0–10.5)
nRBC: 0 % (ref 0.0–0.2)

## 2023-09-19 LAB — BASIC METABOLIC PANEL
Anion gap: 6 (ref 5–15)
BUN: 16 mg/dL (ref 8–23)
CO2: 26 mmol/L (ref 22–32)
Calcium: 8.5 mg/dL — ABNORMAL LOW (ref 8.9–10.3)
Chloride: 103 mmol/L (ref 98–111)
Creatinine, Ser: 0.81 mg/dL (ref 0.61–1.24)
GFR, Estimated: >60 mL/min (ref >60)
Glucose, Bld: 120 mg/dL — ABNORMAL HIGH (ref 70–99)
Potassium: 4.1 mmol/L (ref 3.5–5.1)
Sodium: 135 mmol/L (ref 135–145)

## 2023-09-19 LAB — BPAM RBC
Blood Product Expiration Date: 202412192359
ISSUE DATE / TIME: 202411301117
Unit Type and Rh: 6200

## 2023-09-19 LAB — VARICELLA-ZOSTER BY PCR: Varicella-Zoster, PCR: POSITIVE — AB

## 2023-09-19 MED ORDER — VALACYCLOVIR HCL 500 MG PO TABS
1000.0000 mg | ORAL_TABLET | Freq: Three times a day (TID) | ORAL | Status: DC
  Administered 2023-09-19 – 2023-09-20 (×4): 1000 mg via ORAL
  Filled 2023-09-19 (×6): qty 2

## 2023-09-19 NOTE — Plan of Care (Signed)

## 2023-09-19 NOTE — Plan of Care (Signed)
  Problem: Coping: Goal: Level of anxiety will decrease Outcome: Progressing   Problem: Pain Management: Goal: General experience of comfort will improve Outcome: Progressing

## 2023-09-19 NOTE — Progress Notes (Addendum)
[facility]  Date of Admission:  09/16/2023   Total days of inpatient antibiotics 2  Principal Problem:   Herpes zoster dermatitis Active Problems:   Gastroesophageal reflux disease without esophagitis   Obstructive sleep apnea   Impaired fasting glucose   MDS (myelodysplastic syndrome) (HCC)   Anemia   Urinary retention          Assessment:  55 YM admitted with:   #Gluteal/genital lesion consistent with shingles in immunosuppressed pt #MDS/MPN on parictinib #Urinary retention suspected 2/2 #1 SP foley - Patient states that he had lesions that are painful developed on his buttock right side on Saturday and had spread to underneath and towards his penis.  He suspects they may be inside his meatus as he is having urinary retention, denies dysuria.  Denies any history of STIs.  Does not recall if he has had a cold sore in the past. - I discussed with patient that he is on baricitinib which will need to be held, I will try to reach out to Dr. Candise Che oncology to discuss plan of holding drug.  Would like patient to get at least 24 to 48 hours of IV treatment monitor for clinical progression.  Recommendations: -Lesions appear to be crusted over for the most part can transition to Valtrex 1gm tid tomorrow to complete a total of 10 days EPT 12/9 - Voiding trial prior to discharge given urinary retention - Follow with ID clinic on 12/26 -Follow swab Cx - ID will sign off   Microbiology:   Antibiotics: Acylovir IV  SUBJECTIVE: HE reprots he feel sbetter Interval: Afebrile overnight.  Review of Systems: Review of Systems  All other systems reviewed and are negative.    Scheduled Meds:  alfuzosin  10 mg Oral Q breakfast   vitamin C  500 mg Oral Daily   B-complex with vitamin C  1 tablet Oral Daily   Chlorhexidine Gluconate Cloth  6 each Topical Daily   cholecalciferol  2,000 Units Oral Daily   docusate sodium  100 mg Oral BID    famotidine  20 mg Oral Daily   multivitamin with minerals  1 tablet Oral Q breakfast   omega-3 acid ethyl esters  1 g Oral Daily   Continuous Infusions:  acyclovir 700 mg (09/19/23 0605)   lactated ringers 100 mL/hr at 09/19/23 0603   PRN Meds:.acetaminophen **OR** acetaminophen, ondansetron **OR** ondansetron (ZOFRAN) IV, phenylephrine-shark liver oil-mineral oil-petrolatum, sodium chloride flush, traMADol No Known Allergies  OBJECTIVE: Vitals:   09/18/23 0604 09/18/23 1414 09/18/23 2212 09/19/23 0558  BP: 112/64 (!) 117/59 109/60 119/68  Pulse: 65 75 65 71  Resp: 19 16 18 18   Temp: 98.2 F (36.8 C) 98.1 F (36.7 C) 98 F (36.7 C) 97.6 F (36.4 C)  TempSrc: Oral  Oral Oral  SpO2: 95% 98% 98% 98%  Weight:      Height:       Body mass index is 25.34 kg/m.  Physical Exam Constitutional:      General: He is not in acute distress.    Appearance: He is normal weight. He is not toxic-appearing.  HENT:     Head: Normocephalic and atraumatic.     Right Ear: External ear normal.     Left Ear: External ear normal.     Nose: No congestion or rhinorrhea.     Mouth/Throat:     Mouth: Mucous membranes are moist.     Pharynx: Oropharynx  is clear.  Eyes:     Extraocular Movements: Extraocular movements intact.     Conjunctiva/sclera: Conjunctivae normal.     Pupils: Pupils are equal, round, and reactive to light.  Cardiovascular:     Rate and Rhythm: Normal rate and regular rhythm.     Heart sounds: No murmur heard.    No friction rub. No gallop.  Pulmonary:     Effort: Pulmonary effort is normal.     Breath sounds: Normal breath sounds.  Abdominal:     General: Abdomen is flat. Bowel sounds are normal.     Palpations: Abdomen is soft.  Musculoskeletal:        General: No swelling. Normal range of motion.     Cervical back: Normal range of motion and neck supple.  Skin:    General: Skin is warm and dry.  Neurological:     General: No focal deficit present.     Mental  Status: He is oriented to person, place, and time.  Psychiatric:        Mood and Affect: Mood normal.    Ras is scabbed over.   Lab Results Lab Results  Component Value Date   WBC 4.5 09/19/2023   HGB 7.8 (L) 09/19/2023   HCT 23.9 (L) 09/19/2023   MCV 90.9 09/19/2023   PLT 87 (L) 09/19/2023    Lab Results  Component Value Date   CREATININE 0.81 09/19/2023   BUN 16 09/19/2023   NA 135 09/19/2023   K 4.1 09/19/2023   CL 103 09/19/2023   CO2 26 09/19/2023    Lab Results  Component Value Date   ALT 46 (H) 09/17/2023   AST 24 09/17/2023   ALKPHOS 30 (L) 09/17/2023   BILITOT 1.0 09/17/2023        Danelle Earthly, MD [facility] 09/19/2023, 6:56 AM   I have personally spent 52 minutes involved in face-to-face and non-face-to-face activities for this patient on the day of the visit. Professional time spent includes the following activities: Preparing to see the patient (review of tests), Obtaining and/or reviewing separately obtained history (admission/discharge record), Performing a medically appropriate examination and/or evaluation , Ordering medications/tests/procedures, referring and communicating with other health care professionals, Documenting clinical information in the EMR, Independently interpreting results (not separately reported), Communicating results to the patient/family/caregiver, Counseling and educating the patient/family/caregiver and Care coordination (not separately reported).

## 2023-09-19 NOTE — Progress Notes (Signed)
PROGRESS NOTE    Fernando Lee  ION:[id number] DOB: Nov 21, 1945 DOA: 09/16/2023 PCP: Jackelyn Poling, DO   Brief Narrative: 77 year old with past medical history significant for right knee osteoarthritis, chest discomfort, colon polyps, hyperlipidemia, palpitation, PAC, GERD, hemorrhoids, splenomegaly, prediabetes, hyperlipidemia, nephrolithiasis, ocular migraines who presents to the ED with urinary retention and skin lesions on his right thigh and penis.  He also has a rash in his right buttock.   Assessment & Plan:   Principal Problem:   Herpes zoster dermatitis Active Problems:   MDS (myelodysplastic syndrome) (HCC)   Gastroesophageal reflux disease without esophagitis   Obstructive sleep apnea   Impaired fasting glucose   Anemia   Urinary retention   1-Herpes zoster dermatitis: -Treated  with IV acyclovir for 48 hours.  -Airborne due to immunocompromise status.  -Hold Paracitinib -Transition today to Valtrex 1 gr TID> Monitor on oral therapy.   Acute urinary retention: -Foley catheter placed 11/29 -Continue with alfuzosin Plan to proceed with voiding trial tomorrow,.   Anemia: In the setting of MDS.  Present with a hemoglobin of 7.8 down to 6.7. -Received one unit PRBC 11/30. Hb stable 7.8  MDS: -Continue to hold Vonjo (Pacritinib)  due to active infection Oncology consulted.   GERD: -Pepcid.   Obstructive sleep apnea:  -Continue CPAP at bedtime  Prediabetes: -Diet controlled  Mild transaminases: -Monitor      Estimated body mass index is 25.34 kg/m as calculated from the following:   Height as of this encounter: 5\' 6"  (1.676 m).   Weight as of this encounter: 71.2 kg.   DVT prophylaxis: SCD Code Status: Full code Family Communication: Wife at bedside 12/02 Disposition Plan:  Status is: Inpatient Remains inpatient appropriate because: management of Zoster     Consultants:  ID  Procedures:  None  Antimicrobials:    Subjective: He  report pain right buttocks, blister penis improving.  Plan to proceed with voiding trial tomorrow.   Objective: Vitals:   09/18/23 0604 09/18/23 1414 09/18/23 2212 09/19/23 0558  BP: 112/64 (!) 117/59 109/60 119/68  Pulse: 65 75 65 71  Resp: 19 16 18 18   Temp: 98.2 F (36.8 C) 98.1 F (36.7 C) 98 F (36.7 C) 97.6 F (36.4 C)  TempSrc: Oral  Oral Oral  SpO2: 95% 98% 98% 98%  Weight:      Height:        Intake/Output Summary (Last 24 hours) at 09/19/2023 1319 Last data filed at 09/19/2023 1254 Gross per 24 hour  Intake 2433.97 ml  Output 4100 ml  Net -1666.03 ml   Filed Weights   09/16/23 1051  Weight: 71.2 kg    Examination:  General exam: NAD Respiratory system: CTA Cardiovascular system: S 1, S 2 RRR Gastrointestinal system: BS present, soft,nt Extremities; no edema Skin: rash buttock with scabs, few blister. , small blister penis  Data Reviewed: I have personally reviewed following labs and imaging studies  CBC: Recent Labs  Lab 09/14/23 0737 09/16/23 1143 09/17/23 0347 09/17/23 1730 09/18/23 0330 09/19/23 0349  WBC 3.7* 4.5 4.4  --  4.2 4.5  NEUTROABS 2.5  --   --   --   --   --   HGB 7.5* 7.8* 6.7* 7.6* 7.8* 7.8*  HCT 22.5* 24.1* 19.9* 22.2* 23.0* 23.9*  MCV 88.9 90.6 89.2  --  88.5 90.9  PLT 77* 102* 90*  --  86* 87*   Basic Metabolic Panel: Recent Labs  Lab 09/16/23 1143 09/17/23 0347 09/18/23 0330 09/19/23  0349  NA 136 136 135 135  K 4.0 4.1 3.8 4.1  CL 103 103 104 103  CO2 26 26 26 26   GLUCOSE 133* 117* 117* 120*  BUN 26* 22 19 16   CREATININE 0.87 0.97 0.88 0.81  CALCIUM 8.8* 8.6* 8.3* 8.5*   GFR: Estimated Creatinine Clearance: 66.7 mL/min (by C-G formula based on SCr of 0.81 mg/dL). Liver Function Tests: Recent Labs  Lab 09/17/23 0347  AST 24  ALT 46*  ALKPHOS 30*  BILITOT 1.0  PROT 5.4*  ALBUMIN 3.4*   No results for input(s): "LIPASE", "AMYLASE" in the last 168 hours. No results for input(s): "AMMONIA" in the last 168  hours. Coagulation Profile: No results for input(s): "INR", "PROTIME" in the last 168 hours. Cardiac Enzymes: No results for input(s): "CKTOTAL", "CKMB", "CKMBINDEX", "TROPONINI" in the last 168 hours. BNP (last 3 results) No results for input(s): "PROBNP" in the last 8760 hours. HbA1C: No results for input(s): "HGBA1C" in the last 72 hours. CBG: No results for input(s): "GLUCAP" in the last 168 hours. Lipid Profile: No results for input(s): "CHOL", "HDL", "LDLCALC", "TRIG", "CHOLHDL", "LDLDIRECT" in the last 72 hours. Thyroid Function Tests: No results for input(s): "TSH", "T4TOTAL", "FREET4", "T3FREE", "THYROIDAB" in the last 72 hours. Anemia Panel: No results for input(s): "VITAMINB12", "FOLATE", "FERRITIN", "TIBC", "IRON", "RETICCTPCT" in the last 72 hours. Sepsis Labs: No results for input(s): "PROCALCITON", "LATICACIDVEN" in the last 168 hours.  No results found for this or any previous visit (from the past 240 hour(s)).       Radiology Studies: No results found.      Scheduled Meds:  alfuzosin  10 mg Oral Q breakfast   vitamin C  500 mg Oral Daily   B-complex with vitamin C  1 tablet Oral Daily   Chlorhexidine Gluconate Cloth  6 each Topical Daily   cholecalciferol  2,000 Units Oral Daily   docusate sodium  100 mg Oral BID   famotidine  20 mg Oral Daily   multivitamin with minerals  1 tablet Oral Q breakfast   omega-3 acid ethyl esters  1 g Oral Daily   valACYclovir  1,000 mg Oral TID   Continuous Infusions:     LOS: 3 days    Time spent: 35 minutes    Alvena Kiernan A Edu On, MD [facility]   If 7PM-7AM, please contact night-coverage [email or web address]  09/19/2023, 1:19 PM

## 2023-09-20 DIAGNOSIS — L308 Other specified dermatitis: Secondary | ICD-10-CM | POA: Diagnosis not present

## 2023-09-20 DIAGNOSIS — B028 Zoster with other complications: Secondary | ICD-10-CM | POA: Diagnosis not present

## 2023-09-20 LAB — CBC
HCT: 23.1 % — ABNORMAL LOW (ref 39.0–52.0)
Hemoglobin: 7.9 g/dL — ABNORMAL LOW (ref 13.0–17.0)
MCH: 30.5 pg (ref 26.0–34.0)
MCHC: 34.2 g/dL (ref 30.0–36.0)
MCV: 89.2 fL (ref 80.0–100.0)
Platelets: 100 10*3/uL — ABNORMAL LOW (ref 150–400)
RBC: 2.59 MIL/uL — ABNORMAL LOW (ref 4.22–5.81)
RDW: 13.8 % (ref 11.5–15.5)
WBC: 4.3 10*3/uL (ref 4.0–10.5)
nRBC: 0 % (ref 0.0–0.2)

## 2023-09-20 MED ORDER — VALACYCLOVIR HCL 1 G PO TABS: 1000.0000 mg | ORAL_TABLET | Freq: Three times a day (TID) | ORAL | 0 refills | Status: AC

## 2023-09-20 MED ORDER — ACETAMINOPHEN 325 MG PO TABS: 650.0000 mg | ORAL_TABLET | Freq: Four times a day (QID) | ORAL | 0 refills | Status: DC | PRN

## 2023-09-20 MED ORDER — HEPARIN SOD (PORK) LOCK FLUSH 100 UNIT/ML IV SOLN
500.0000 [IU] | INTRAVENOUS | Status: AC | PRN
  Administered 2023-09-20: 500 [IU]
  Filled 2023-09-20: qty 5

## 2023-09-20 NOTE — Plan of Care (Signed)

## 2023-09-20 NOTE — Plan of Care (Signed)
  Problem: Coping: Goal: Level of anxiety will decrease Outcome: Progressing   Problem: Pain Management: Goal: General experience of comfort will improve Outcome: Progressing   Problem: Safety: Goal: Ability to remain free from injury will improve Outcome: Progressing

## 2023-09-20 NOTE — Care Management Important Message (Signed)
Important Message  Patient Details IM Letter given. Name: Fernando Lee MRN: [id number] Date of Birth: 02/21/1946   Important Message Given:  Yes - Medicare IM     Caren Macadam 09/20/2023, 11:25 AM

## 2023-09-20 NOTE — Discharge Summary (Signed)
Physician Discharge Summary   Patient: Fernando Lee MRN: [id number] DOB: 1946-10-10  Admit date:     09/16/2023  Discharge date: 09/20/23  Discharge Physician: Alba Cory   PCP: Jackelyn Poling, DO   Recommendations at discharge:   Needs to follow up with ID for further care.  Continue to hold Baricitinib.  Follow up with urology as needed.   Discharge Diagnoses: Principal Problem:   Herpes zoster dermatitis Active Problems:   MDS (myelodysplastic syndrome) (HCC)   Gastroesophageal reflux disease without esophagitis   Obstructive sleep apnea   Impaired fasting glucose   Anemia   Urinary retention  Resolved Problems:   * No resolved hospital problems. *  Hospital Course: 77 year old with past medical history significant for right knee osteoarthritis, chest discomfort, colon polyps, hyperlipidemia, palpitation, PAC, GERD, hemorrhoids, splenomegaly, prediabetes, hyperlipidemia, nephrolithiasis, ocular migraines who presents to the ED with urinary retention and skin lesions on his right thigh and penis.  He also has a rash in his right buttock.    Assessment and Plan: 1-Herpes zoster dermatitis: -Treated  with IV acyclovir for 48 hours.  -Airborne due to immunocompromise status.  -Hold Paracitinib -Transition today to Valtrex 1 gr TID> discharge on total 10 days.    Acute urinary retention: -Foley catheter placed 11/29 -Continue with alfuzosin Plan to proceed with voiding trial prior to discharge/    Anemia: In the setting of MDS.  Present with a hemoglobin of 7.8 down to 6.7. -Received one unit PRBC 11/30. Hb stable 7.8   MDS: -Continue to hold Vonjo (Pacritinib)  due to active infection Oncology consulted.    GERD: -Pepcid.    Obstructive sleep apnea:  -Continue CPAP at bedtime   Prediabetes: -Diet controlled   Mild transaminases: -Monitor              Consultants: ID Procedures performed: None Disposition: Home Diet recommendation:   Discharge Diet Orders (From admission, onward)     Start     Ordered   09/20/23 0000  Diet - low sodium heart healthy        09/20/23 1053           Cardiac diet DISCHARGE MEDICATION: Allergies as of 09/20/2023   No Known Allergies      Medication List     STOP taking these medications    furosemide 20 MG tablet Commonly known as: LASIX   potassium chloride SA 20 MEQ tablet Commonly known as: KLOR-CON M   Vonjo 100 MG capsule Generic drug: pacritinib citrate       TAKE these medications    acetaminophen 325 MG tablet Commonly known as: TYLENOL Take 2 tablets (650 mg total) by mouth every 6 (six) hours as needed for mild pain (pain score 1-3) (or Fever >/= 101).   alfuzosin 10 MG 24 hr tablet Commonly known as: UROXATRAL Take 10 mg by mouth daily with breakfast.   B COMPLEX PO Take 1 tablet by mouth daily.   docusate sodium 100 MG capsule Commonly known as: COLACE Take 100 mg by mouth 2 (two) times daily.   fish oil-omega-3 fatty acids 1000 MG capsule Take 1 g by mouth daily.   GLUCOSAMINE CHONDROITIN MSM PO Take 1 tablet by mouth daily.   ibuprofen 200 MG tablet Commonly known as: ADVIL Take 100-200 mg by mouth every 6 (six) hours as needed for mild pain (pain score 1-3), headache or fever.   multivitamin tablet Take 1 tablet by mouth daily with breakfast.  QC TUMERIC COMPLEX PO Take 750 mg by mouth daily.   senna-docusate 8.6-50 MG tablet Commonly known as: Senna S Take 2 tablets by mouth at bedtime.   traMADol 50 MG tablet Commonly known as: ULTRAM Take 50 mg by mouth every 6 (six) hours as needed for moderate pain (pain score 4-6) or severe pain (pain score 7-10).   valACYclovir 1000 MG tablet Commonly known as: VALTREX Take 1 tablet (1,000 mg total) by mouth 3 (three) times daily for 6 days.   vitamin C 250 MG tablet Commonly known as: ASCORBIC ACID Take 500 mg by mouth daily.   Vitamin D3 50 MCG (2000 UT) Tabs Take 2,000  Units by mouth daily.        Follow-up Information     Jackelyn Poling, DO Follow up in 1 week(s).   Specialty: Family Medicine Contact information: [address] [place] [address] [phone]                Discharge Exam: Ceasar Mons Weights   09/16/23 1051  Weight: 71.2 kg   General; NAD  Condition at discharge: stable  The results of significant diagnostics from this hospitalization (including imaging, microbiology, ancillary and laboratory) are listed below for reference.   Imaging Studies: No results found.  Microbiology: Results for orders placed or performed during the hospital encounter of 09/16/23  Varicella-zoster by PCR     Status: Abnormal   Collection Time: 09/17/23 12:00 PM   Specimen: Lesion; Sterile Swab  Result Value Ref Range Status   Varicella-Zoster, PCR Positive (A) Negative Final    Comment: (NOTE) Varicella Zoster Virus DNA detected. This test was developed and its performance characteristics determined by [facility].  It has not been cleared or approved by the Food and Drug Administration.  The FDA has determined that such clearance or approval is not necessary. Performed At: [facility] [address] [place], [place] [id number] Jolene Schimke MD SA:[id number]     Labs: CBC: Recent Labs  Lab 09/14/23 0737 09/16/23 1143 09/17/23 0347 09/17/23 1730 09/18/23 0330 09/19/23 0349  WBC 3.7* 4.5 4.4  --  4.2 4.5  NEUTROABS 2.5  --   --   --   --   --   HGB 7.5* 7.8* 6.7* 7.6* 7.8* 7.8*  HCT 22.5* 24.1* 19.9* 22.2* 23.0* 23.9*  MCV 88.9 90.6 89.2  --  88.5 90.9  PLT 77* 102* 90*  --  86* 87*   Basic Metabolic Panel: Recent Labs  Lab 09/16/23 1143 09/17/23 0347 09/18/23 0330 09/19/23 0349  NA 136 136 135 135  K 4.0 4.1 3.8 4.1  CL 103 103 104 103  CO2 26 26 26 26   GLUCOSE 133* 117* 117* 120*  BUN 26* 22 19 16   CREATININE 0.87 0.97 0.88 0.81  CALCIUM 8.8* 8.6* 8.3* 8.5*   Liver Function Tests: Recent Labs   Lab 09/17/23 0347  AST 24  ALT 46*  ALKPHOS 30*  BILITOT 1.0  PROT 5.4*  ALBUMIN 3.4*   CBG: No results for input(s): "GLUCAP" in the last 168 hours.  Discharge time spent: greater than 30 minutes.  Signed: Alba Cory, MD [facility] 09/20/2023

## 2023-09-22 ENCOUNTER — Inpatient Hospital Stay: Payer: Medicare Other

## 2023-09-27 ENCOUNTER — Other Ambulatory Visit: Payer: Self-pay | Admitting: Hematology

## 2023-09-27 DIAGNOSIS — D84821 Immunodeficiency due to drugs: Secondary | ICD-10-CM | POA: Diagnosis not present

## 2023-09-27 DIAGNOSIS — R339 Retention of urine, unspecified: Secondary | ICD-10-CM | POA: Diagnosis not present

## 2023-09-27 DIAGNOSIS — D649 Anemia, unspecified: Secondary | ICD-10-CM | POA: Diagnosis not present

## 2023-09-27 DIAGNOSIS — D469 Myelodysplastic syndrome, unspecified: Secondary | ICD-10-CM

## 2023-09-27 DIAGNOSIS — D696 Thrombocytopenia, unspecified: Secondary | ICD-10-CM | POA: Diagnosis not present

## 2023-09-27 DIAGNOSIS — I700 Atherosclerosis of aorta: Secondary | ICD-10-CM | POA: Diagnosis not present

## 2023-09-27 DIAGNOSIS — Z09 Encounter for follow-up examination after completed treatment for conditions other than malignant neoplasm: Secondary | ICD-10-CM | POA: Diagnosis not present

## 2023-09-27 DIAGNOSIS — B028 Zoster with other complications: Secondary | ICD-10-CM | POA: Diagnosis not present

## 2023-09-28 ENCOUNTER — Other Ambulatory Visit: Payer: Self-pay

## 2023-09-28 DIAGNOSIS — D469 Myelodysplastic syndrome, unspecified: Secondary | ICD-10-CM

## 2023-09-28 IMAGING — CT CT BIOPSY AND ASPIRATION BONE MARROW
1 of 2 series · 15 of 28 positions shown, 19 images · non-contrast
Comparison: none

INDICATION: 76-year-old male with a history of myelodysplastic syndrome and
progressive anemia. He presents for repeat bone marrow biopsy to
assess for disease progression.

[Series 2: i-spiral 5.0 br40 · axial · 0.98mm/px · z∈[-117,-43]mm · 15 of 25 slices shown, 19 images]
[im 2/25  mediastinal]
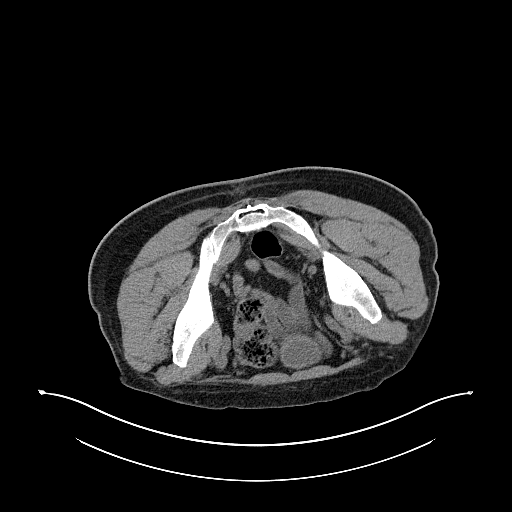
[im 2/25  lung]
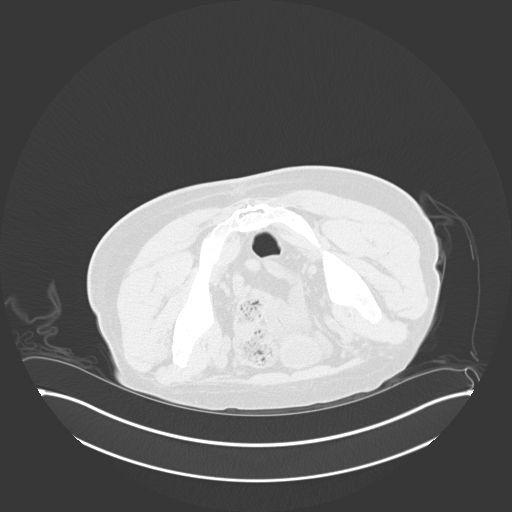
[im 4/25  lung]
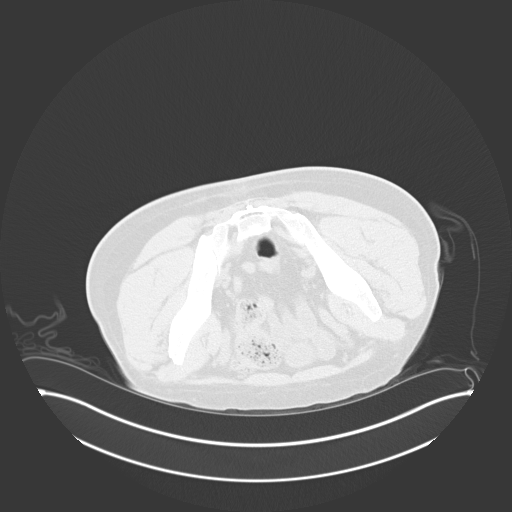
[im 5/25  lung]
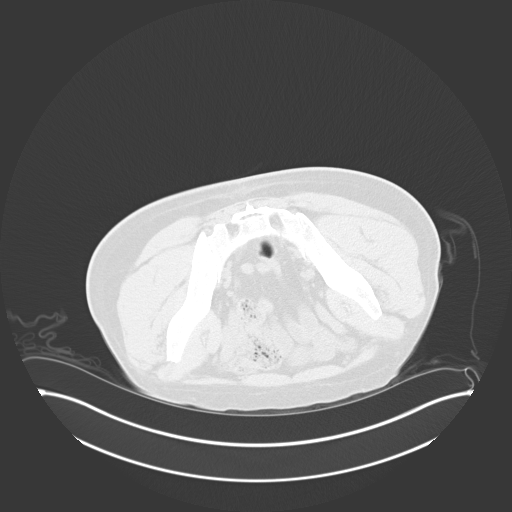
[im 6/25  lung]
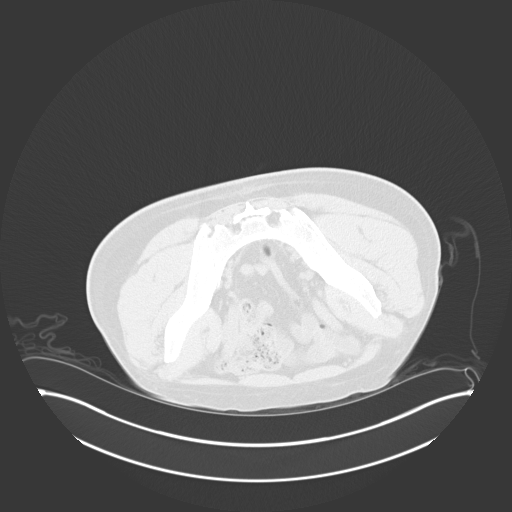
[im 8/25  mediastinal]
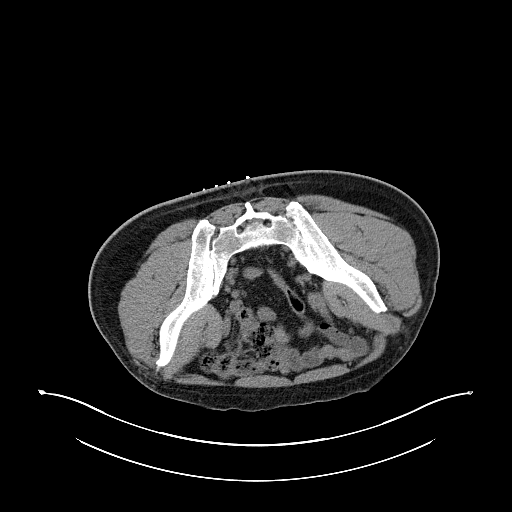
[im 8/25  lung]
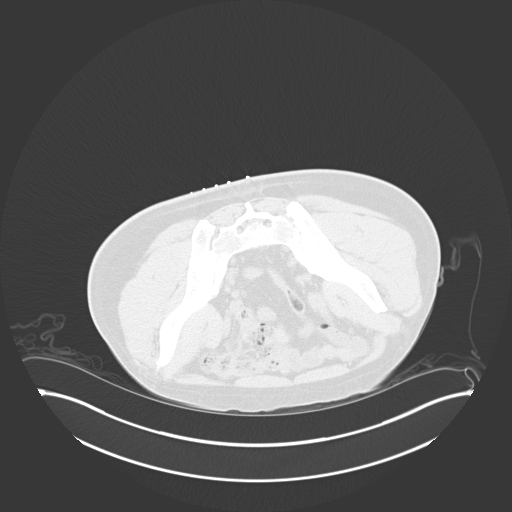
[im 9/25  lung]
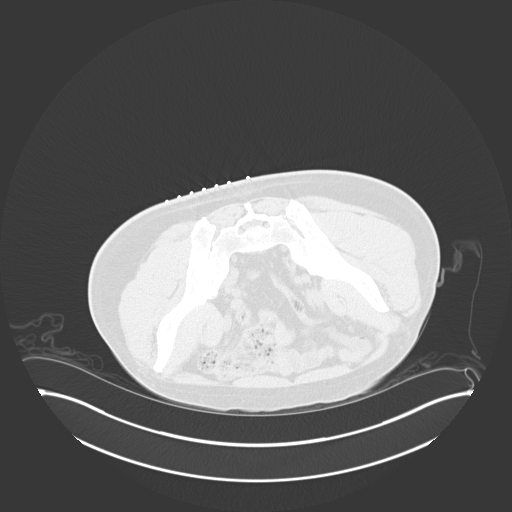
[im 11/25  lung]
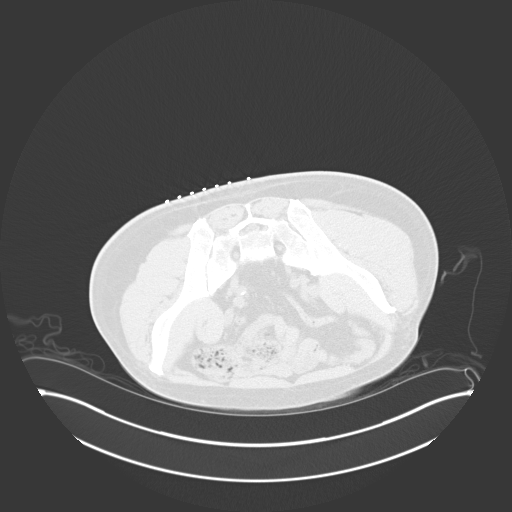
[im 13/25  lung]
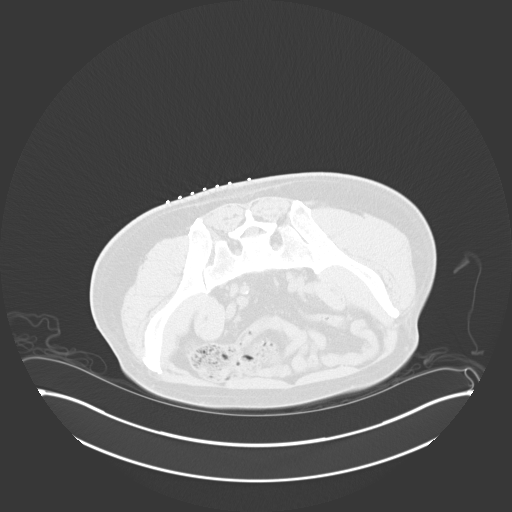
[im 14/25  mediastinal]
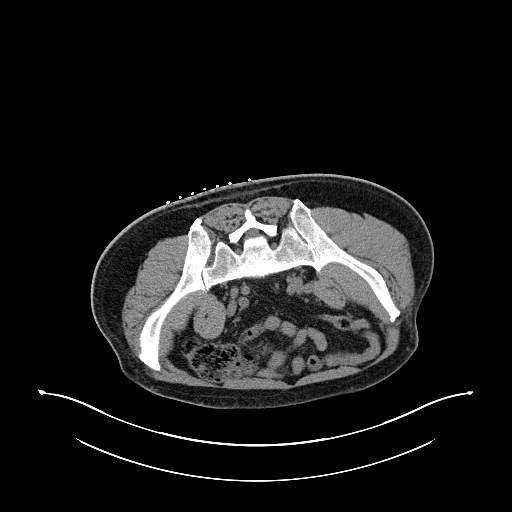
[im 14/25  lung]
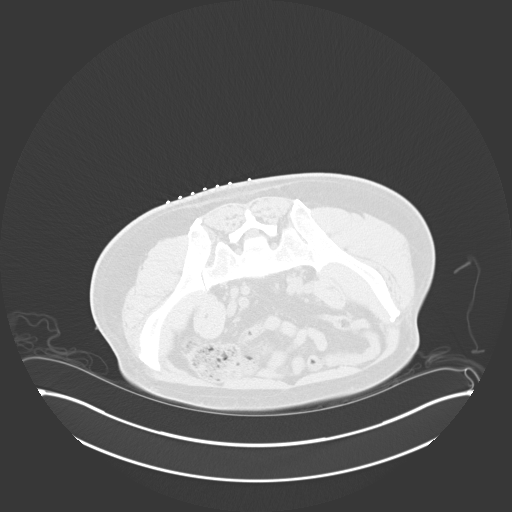
[im 16/25  lung]
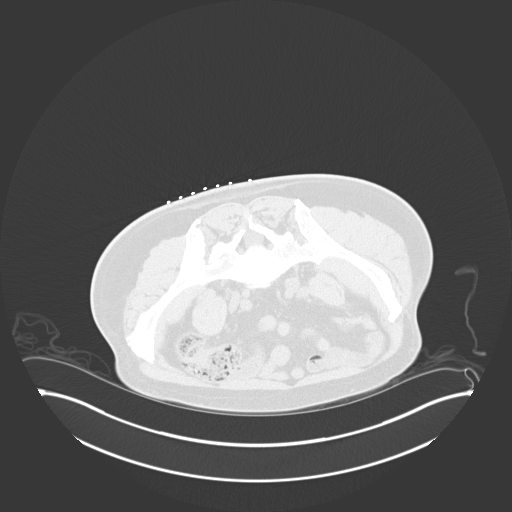
[im 17/25  lung]
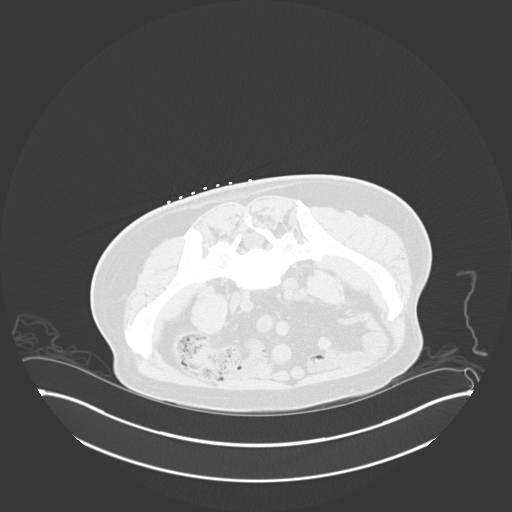
[im 19/25  lung]
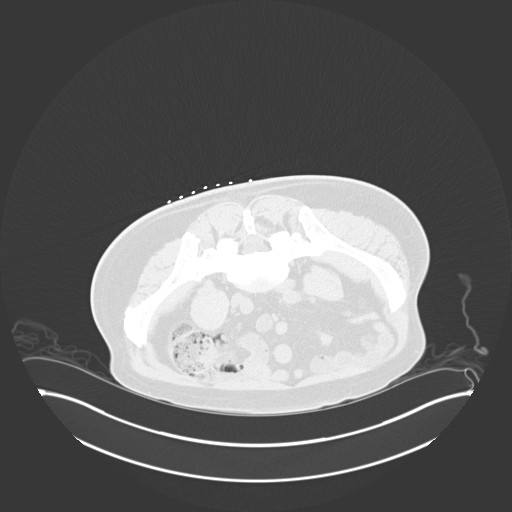
[im 20/25  mediastinal]
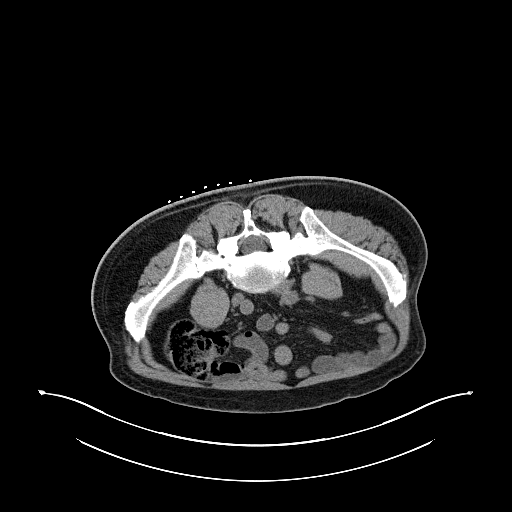
[im 20/25  lung]
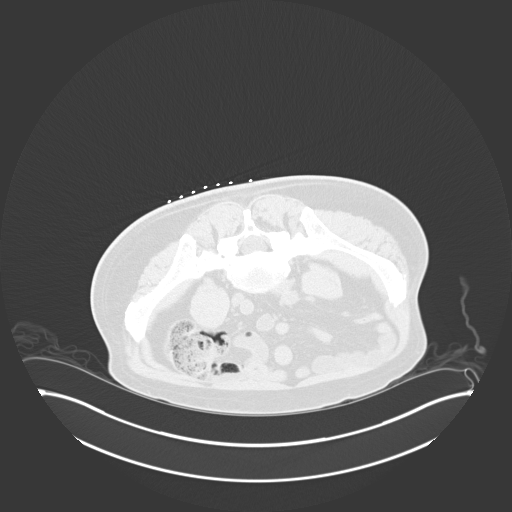
[im 21/25  lung]
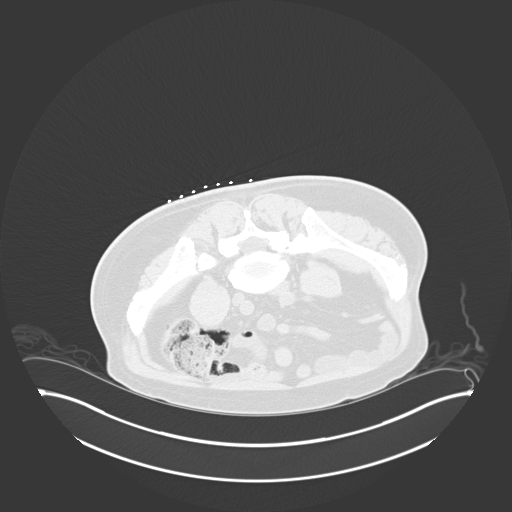
[im 23/25  lung]
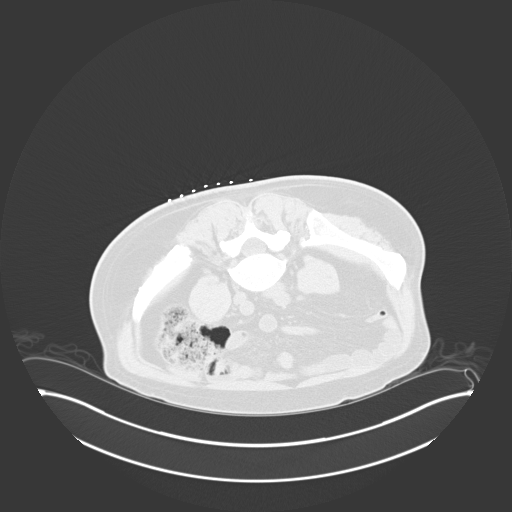

[15 of 28 positions shown; findings below may reference images not displayed]

EXAM:
CT GUIDED BONE MARROW ASPIRATION AND CORE BIOPSY

MEDICATIONS:
None.

ANESTHESIA/SEDATION:
Moderate (conscious) sedation was employed during this procedure. A
total of 2 milligrams versed and 100 micrograms fentanyl were
administered intravenously. The patient's level of consciousness and
vital signs were monitored continuously by radiology nursing
throughout the procedure under my direct supervision.

Total monitored sedation time: 10 minutes

FLUOROSCOPY:
None.

COMPLICATIONS:
None immediate.

Estimated blood loss: <25 mL

PROCEDURE:
Informed written consent was obtained from the patient after a
thorough discussion of the procedural risks, benefits and
alternatives. All questions were addressed. Maximal Sterile Barrier
Technique was utilized including caps, mask, sterile gowns, sterile
gloves, sterile drape, hand hygiene and skin antiseptic. A timeout
was performed prior to the initiation of the procedure.

The patient was positioned prone and non-contrast localization CT
was performed of the pelvis to demonstrate the iliac marrow spaces.

Maximal barrier sterile technique utilized including caps, mask,
sterile gowns, sterile gloves, large sterile drape, hand hygiene,
and betadine prep.

Under sterile conditions and local anesthesia, an 11 gauge coaxial
bone biopsy needle was advanced into the right iliac marrow space.
Needle position was confirmed with CT imaging. Initially, bone
marrow aspiration was performed. Next, the 11 gauge outer cannula
was utilized to obtain a right iliac bone marrow core biopsy. Needle
was removed. Hemostasis was obtained with compression. The patient
tolerated the procedure well. Samples were prepared with the
cytotechnologist.
IMPRESSION: Technically successful right iliac bone marrow aspirate and core
biopsy.

## 2023-09-28 IMAGING — CT CT BIOPSY
1 of 2 series · 15 of 28 positions shown, 19 images · non-contrast
Comparison: none

INDICATION: 76-year-old male with a history of myelodysplastic syndrome and
progressive anemia. He presents for repeat bone marrow biopsy to
assess for disease progression.

[Series 2: i-spiral 5.0 br40 · axial · 0.98mm/px · z∈[-117,-43]mm · 15 of 25 slices shown, 19 images]
[im 2/25  mediastinal]
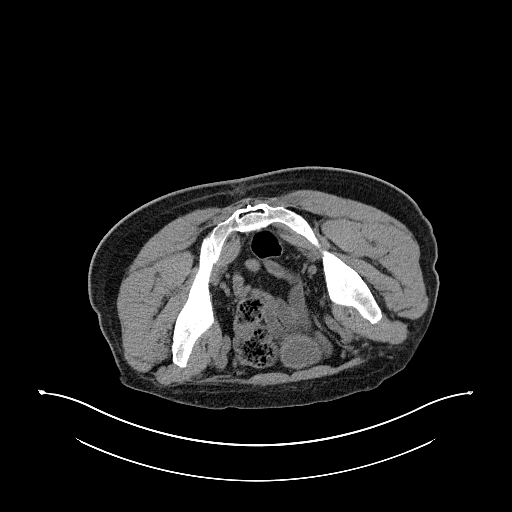
[im 2/25  lung]
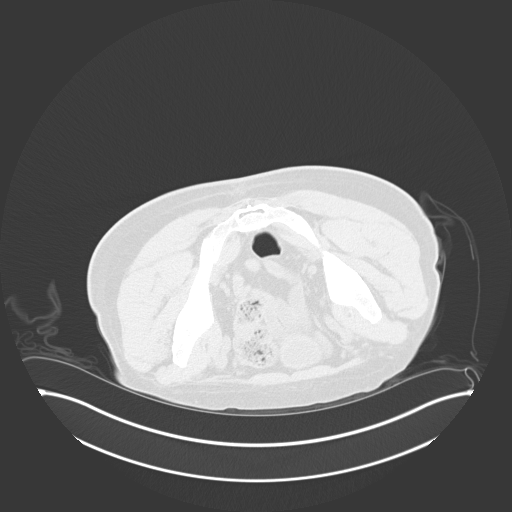
[im 4/25  lung]
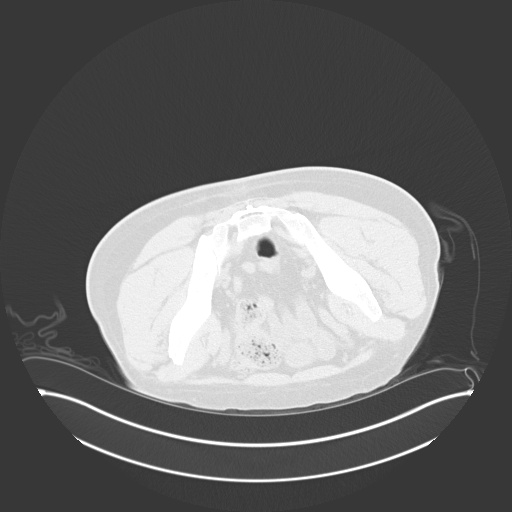
[im 5/25  lung]
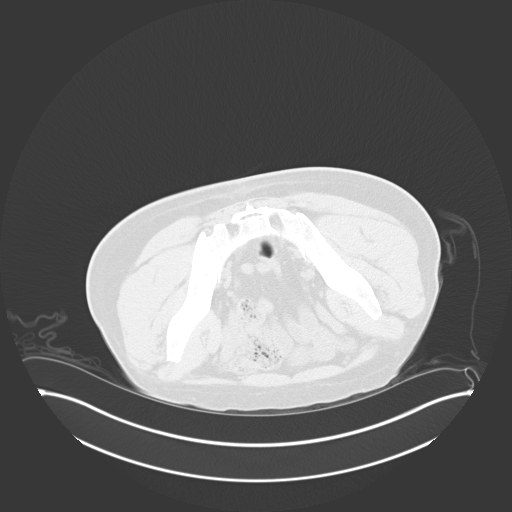
[im 6/25  lung]
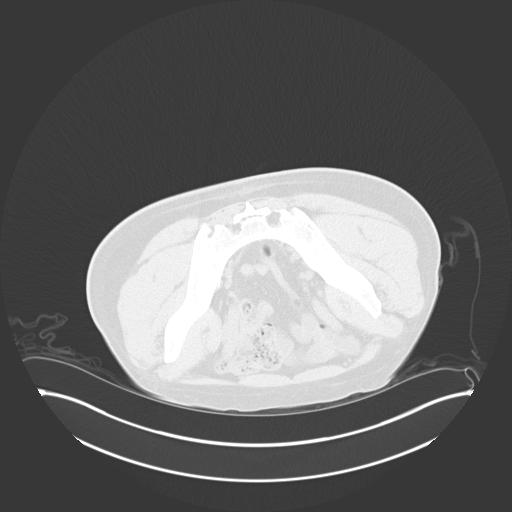
[im 8/25  mediastinal]
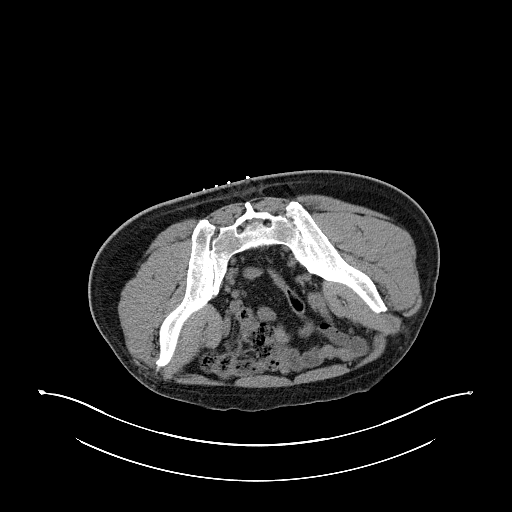
[im 8/25  lung]
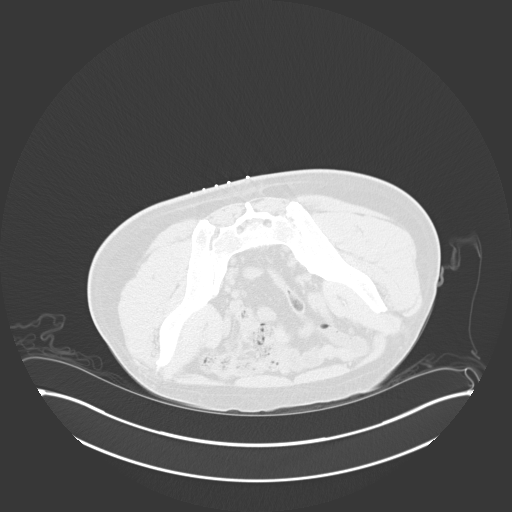
[im 9/25  lung]
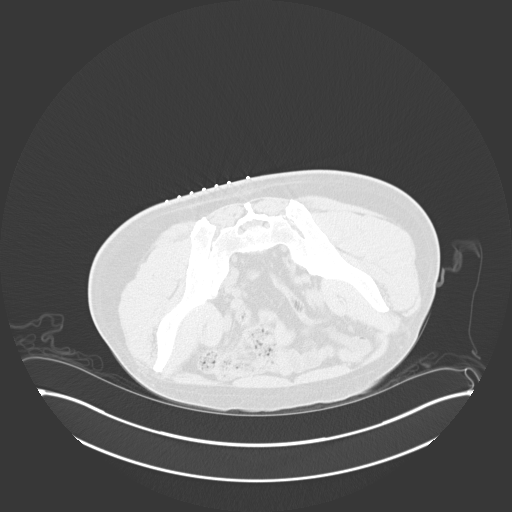
[im 11/25  lung]
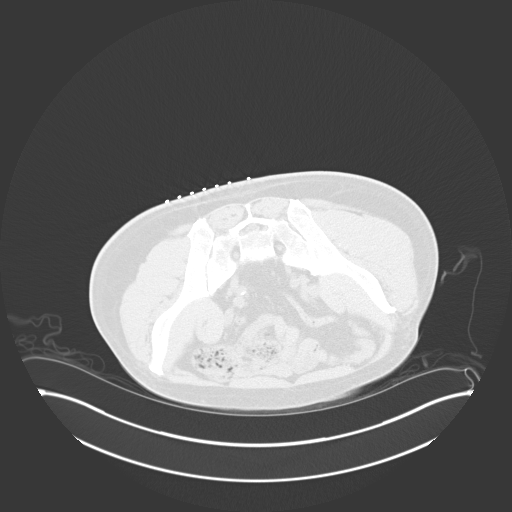
[im 13/25  lung]
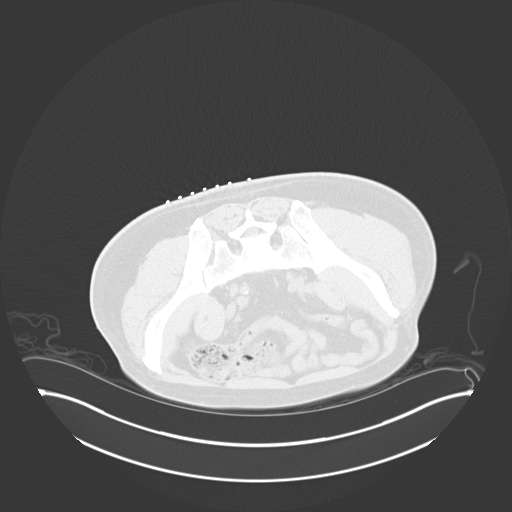
[im 14/25  mediastinal]
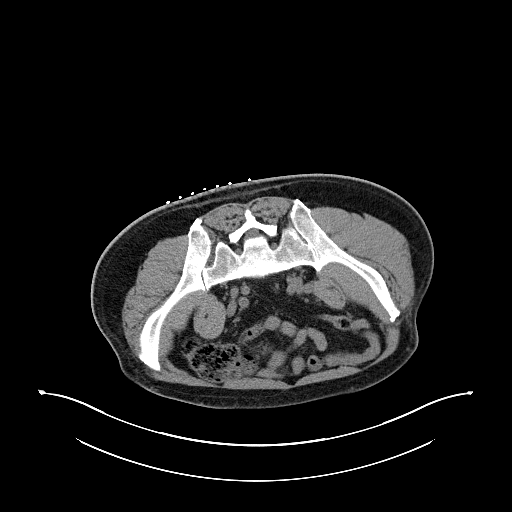
[im 14/25  lung]
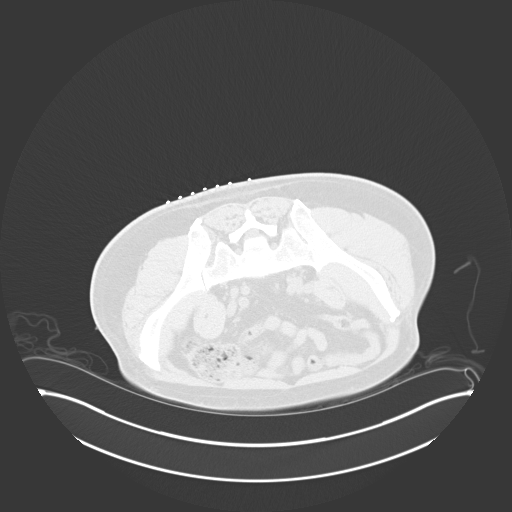
[im 16/25  lung]
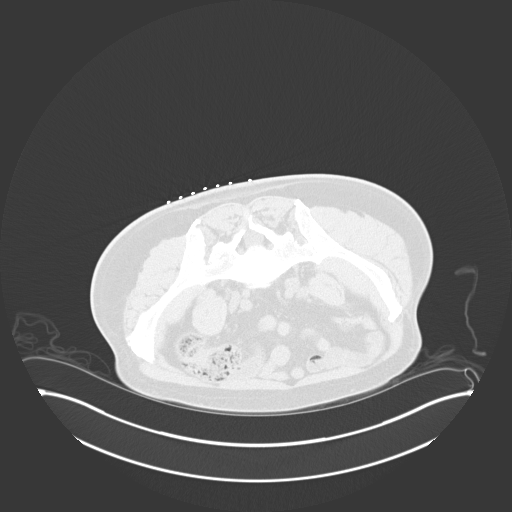
[im 17/25  lung]
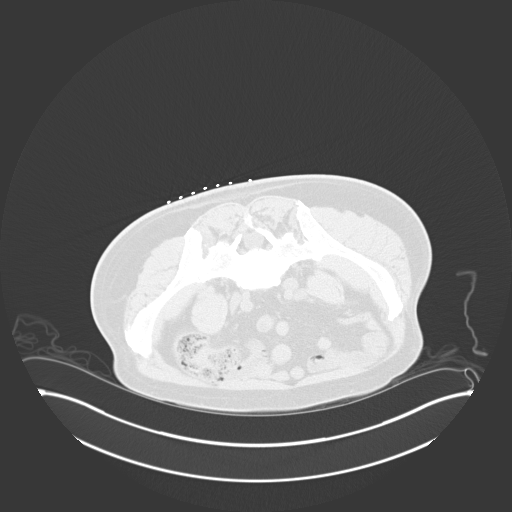
[im 19/25  lung]
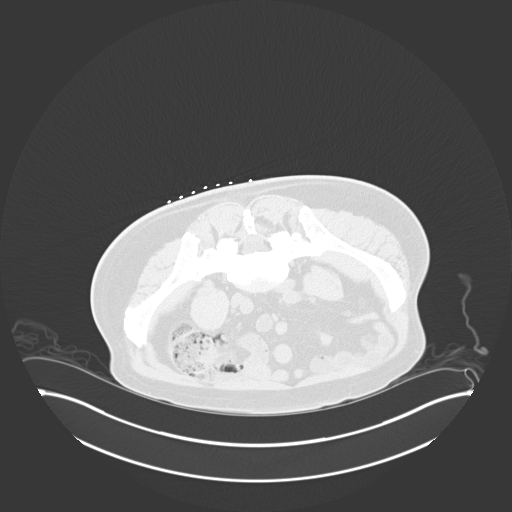
[im 20/25  mediastinal]
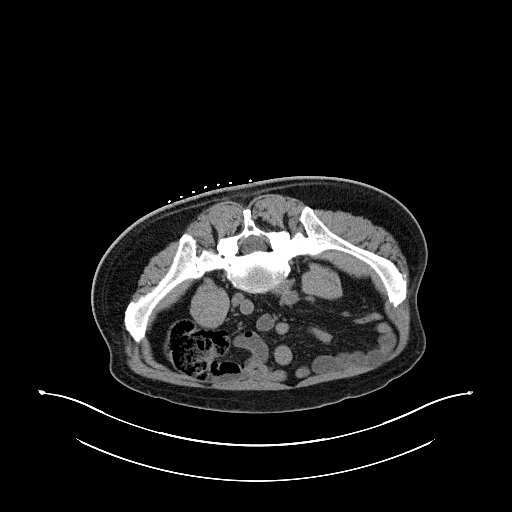
[im 20/25  lung]
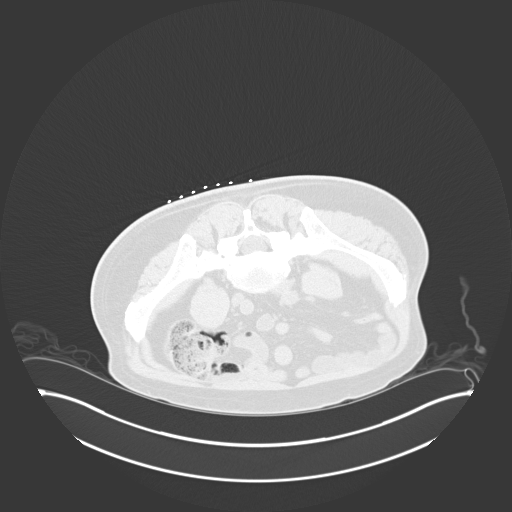
[im 21/25  lung]
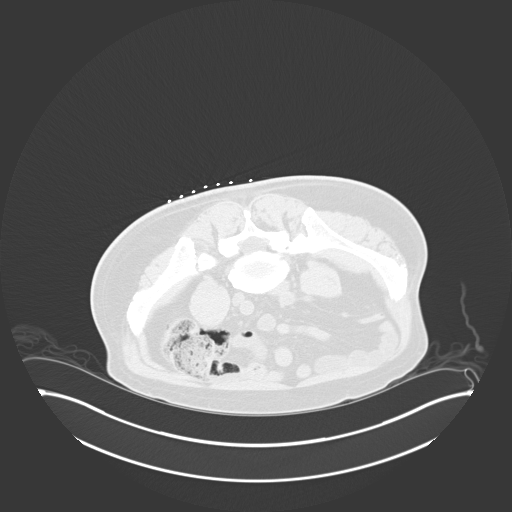
[im 23/25  lung]
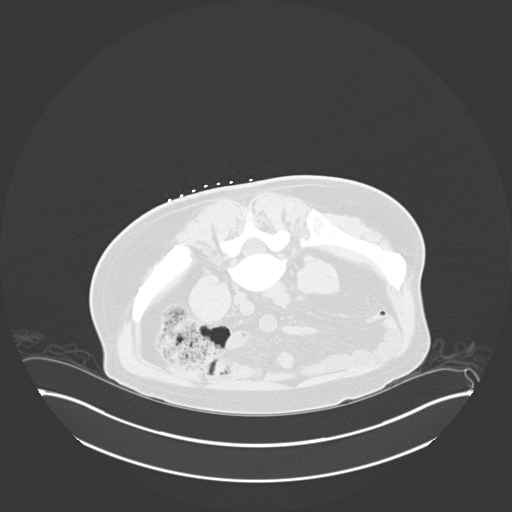

[15 of 28 positions shown; findings below may reference images not displayed]

EXAM:
CT GUIDED BONE MARROW ASPIRATION AND CORE BIOPSY

MEDICATIONS:
None.

ANESTHESIA/SEDATION:
Moderate (conscious) sedation was employed during this procedure. A
total of 2 milligrams versed and 100 micrograms fentanyl were
administered intravenously. The patient's level of consciousness and
vital signs were monitored continuously by radiology nursing
throughout the procedure under my direct supervision.

Total monitored sedation time: 10 minutes

FLUOROSCOPY:
None.

COMPLICATIONS:
None immediate.

Estimated blood loss: <25 mL

PROCEDURE:
Informed written consent was obtained from the patient after a
thorough discussion of the procedural risks, benefits and
alternatives. All questions were addressed. Maximal Sterile Barrier
Technique was utilized including caps, mask, sterile gowns, sterile
gloves, sterile drape, hand hygiene and skin antiseptic. A timeout
was performed prior to the initiation of the procedure.

The patient was positioned prone and non-contrast localization CT
was performed of the pelvis to demonstrate the iliac marrow spaces.

Maximal barrier sterile technique utilized including caps, mask,
sterile gowns, sterile gloves, large sterile drape, hand hygiene,
and betadine prep.

Under sterile conditions and local anesthesia, an 11 gauge coaxial
bone biopsy needle was advanced into the right iliac marrow space.
Needle position was confirmed with CT imaging. Initially, bone
marrow aspiration was performed. Next, the 11 gauge outer cannula
was utilized to obtain a right iliac bone marrow core biopsy. Needle
was removed. Hemostasis was obtained with compression. The patient
tolerated the procedure well. Samples were prepared with the
cytotechnologist.
IMPRESSION: Technically successful right iliac bone marrow aspirate and core
biopsy.

## 2023-09-29 ENCOUNTER — Inpatient Hospital Stay: Payer: Medicare Other | Attending: Hematology

## 2023-09-29 ENCOUNTER — Other Ambulatory Visit: Payer: Self-pay

## 2023-09-29 ENCOUNTER — Inpatient Hospital Stay: Payer: Medicare Other

## 2023-09-29 DIAGNOSIS — Z79899 Other long term (current) drug therapy: Secondary | ICD-10-CM | POA: Diagnosis not present

## 2023-09-29 DIAGNOSIS — J90 Pleural effusion, not elsewhere classified: Secondary | ICD-10-CM | POA: Diagnosis not present

## 2023-09-29 DIAGNOSIS — D72829 Elevated white blood cell count, unspecified: Secondary | ICD-10-CM | POA: Insufficient documentation

## 2023-09-29 DIAGNOSIS — R0602 Shortness of breath: Secondary | ICD-10-CM | POA: Diagnosis not present

## 2023-09-29 DIAGNOSIS — D469 Myelodysplastic syndrome, unspecified: Secondary | ICD-10-CM

## 2023-09-29 DIAGNOSIS — K219 Gastro-esophageal reflux disease without esophagitis: Secondary | ICD-10-CM | POA: Diagnosis not present

## 2023-09-29 DIAGNOSIS — R5383 Other fatigue: Secondary | ICD-10-CM | POA: Diagnosis not present

## 2023-09-29 DIAGNOSIS — D539 Nutritional anemia, unspecified: Secondary | ICD-10-CM | POA: Insufficient documentation

## 2023-09-29 DIAGNOSIS — G629 Polyneuropathy, unspecified: Secondary | ICD-10-CM | POA: Diagnosis not present

## 2023-09-29 DIAGNOSIS — Z87891 Personal history of nicotine dependence: Secondary | ICD-10-CM | POA: Insufficient documentation

## 2023-09-29 DIAGNOSIS — E782 Mixed hyperlipidemia: Secondary | ICD-10-CM | POA: Diagnosis not present

## 2023-09-29 DIAGNOSIS — N202 Calculus of kidney with calculus of ureter: Secondary | ICD-10-CM | POA: Insufficient documentation

## 2023-09-29 DIAGNOSIS — R109 Unspecified abdominal pain: Secondary | ICD-10-CM | POA: Diagnosis not present

## 2023-09-29 DIAGNOSIS — D464 Refractory anemia, unspecified: Secondary | ICD-10-CM | POA: Insufficient documentation

## 2023-09-29 DIAGNOSIS — M129 Arthropathy, unspecified: Secondary | ICD-10-CM | POA: Insufficient documentation

## 2023-09-29 DIAGNOSIS — D75839 Thrombocytosis, unspecified: Secondary | ICD-10-CM | POA: Insufficient documentation

## 2023-09-29 DIAGNOSIS — Z860100 Personal history of colon polyps, unspecified: Secondary | ICD-10-CM | POA: Diagnosis not present

## 2023-09-29 DIAGNOSIS — R161 Splenomegaly, not elsewhere classified: Secondary | ICD-10-CM | POA: Diagnosis not present

## 2023-09-29 DIAGNOSIS — Z8719 Personal history of other diseases of the digestive system: Secondary | ICD-10-CM | POA: Diagnosis not present

## 2023-09-29 LAB — CBC WITH DIFFERENTIAL (CANCER CENTER ONLY)
Abs Immature Granulocytes: 0.02 10*3/uL (ref 0.00–0.07)
Basophils Absolute: 0.1 10*3/uL (ref 0.0–0.1)
Basophils Relative: 3 %
Eosinophils Absolute: 0.3 10*3/uL (ref 0.0–0.5)
Eosinophils Relative: 7 %
HCT: 21.9 % — ABNORMAL LOW (ref 39.0–52.0)
Hemoglobin: 7.2 g/dL — ABNORMAL LOW (ref 13.0–17.0)
Immature Granulocytes: 1 %
Lymphocytes Relative: 10 %
Lymphs Abs: 0.4 10*3/uL — ABNORMAL LOW (ref 0.7–4.0)
MCH: 29.3 pg (ref 26.0–34.0)
MCHC: 32.9 g/dL (ref 30.0–36.0)
MCV: 89 fL (ref 80.0–100.0)
Monocytes Absolute: 0.4 10*3/uL (ref 0.1–1.0)
Monocytes Relative: 10 %
Neutro Abs: 2.6 10*3/uL (ref 1.7–7.7)
Neutrophils Relative %: 69 %
Platelet Count: 98 10*3/uL — ABNORMAL LOW (ref 150–400)
RBC: 2.46 MIL/uL — ABNORMAL LOW (ref 4.22–5.81)
RDW: 13.3 % (ref 11.5–15.5)
WBC Count: 3.7 10*3/uL — ABNORMAL LOW (ref 4.0–10.5)
nRBC: 0 % (ref 0.0–0.2)

## 2023-09-29 LAB — SAMPLE TO BLOOD BANK

## 2023-09-29 LAB — PREPARE RBC (CROSSMATCH)

## 2023-09-29 MED ORDER — SODIUM CHLORIDE 0.9% IV SOLUTION
250.0000 mL | INTRAVENOUS | Status: DC
  Administered 2023-09-29: 100 mL via INTRAVENOUS

## 2023-09-29 MED ORDER — SODIUM CHLORIDE 0.9% FLUSH
10.0000 mL | Freq: Once | INTRAVENOUS | Status: AC
  Administered 2023-09-29: 10 mL

## 2023-09-29 MED ORDER — HEPARIN SOD (PORK) LOCK FLUSH 100 UNIT/ML IV SOLN
250.0000 [IU] | INTRAVENOUS | Status: AC | PRN
  Administered 2023-09-29: 250 [IU]

## 2023-09-29 MED ORDER — METHYLPREDNISOLONE SODIUM SUCC 40 MG IJ SOLR
40.0000 mg | Freq: Once | INTRAMUSCULAR | Status: AC
  Administered 2023-09-29: 40 mg via INTRAVENOUS
  Filled 2023-09-29: qty 1

## 2023-09-29 MED ORDER — ACETAMINOPHEN 325 MG PO TABS
650.0000 mg | ORAL_TABLET | Freq: Once | ORAL | Status: AC
  Administered 2023-09-29: 650 mg via ORAL
  Filled 2023-09-29: qty 2

## 2023-09-29 NOTE — Patient Instructions (Signed)

## 2023-09-30 ENCOUNTER — Encounter: Payer: Self-pay | Admitting: Cardiology

## 2023-09-30 ENCOUNTER — Encounter: Payer: Self-pay | Admitting: Hematology

## 2023-09-30 DIAGNOSIS — D84821 Immunodeficiency due to drugs: Secondary | ICD-10-CM

## 2023-09-30 DIAGNOSIS — I700 Atherosclerosis of aorta: Secondary | ICD-10-CM | POA: Insufficient documentation

## 2023-09-30 HISTORY — DX: Immunodeficiency due to drugs: D84.821

## 2023-09-30 LAB — TYPE AND SCREEN
ABO/RH(D): A POS
Antibody Screen: NEGATIVE
Unit division: 0

## 2023-09-30 LAB — BPAM RBC
Blood Product Expiration Date: 202501022359
ISSUE DATE / TIME: 202412121048
Unit Type and Rh: 6200

## 2023-10-03 DIAGNOSIS — C946 Myelodysplastic disease, not classified: Secondary | ICD-10-CM | POA: Diagnosis not present

## 2023-10-04 ENCOUNTER — Ambulatory Visit: Payer: Medicare Other | Attending: Cardiology | Admitting: Cardiology

## 2023-10-04 VITALS — BP 100/50 | HR 60 | Ht 67.0 in | Wt 163.0 lb

## 2023-10-04 DIAGNOSIS — R0609 Other forms of dyspnea: Secondary | ICD-10-CM | POA: Diagnosis not present

## 2023-10-04 DIAGNOSIS — I700 Atherosclerosis of aorta: Secondary | ICD-10-CM

## 2023-10-04 DIAGNOSIS — I4729 Other ventricular tachycardia: Secondary | ICD-10-CM

## 2023-10-04 DIAGNOSIS — R0789 Other chest pain: Secondary | ICD-10-CM | POA: Diagnosis not present

## 2023-10-04 NOTE — Patient Instructions (Addendum)
Medication Instructions:  Your physician recommends that you continue on your current medications as directed. Please refer to the Current Medication list given to you today.  *If you need a refill on your cardiac medications before your next appointment, please call your pharmacy*   Lab Work: None Ordered If you have labs (blood work) drawn today and your tests are completely normal, you will receive your results only by: MyChart Message (if you have MyChart) OR A paper copy in the mail If you have any lab test that is abnormal or we need to change your treatment, we will call you to review the results.   Testing/Procedures: Your physician has requested that you have an echocardiogram. Echocardiography is a painless test that uses sound waves to create images of your heart. It provides your doctor with information about the size and shape of your heart and how well your heart's chambers and valves are working. This procedure takes approximately one hour. There are no restrictions for this procedure. Please do NOT wear cologne, perfume, aftershave, or lotions (deodorant is allowed). Please arrive 15 minutes prior to your appointment time.  Please note: We ask at that you not bring children with you during ultrasound (echo/ vascular) testing. Due to room size and safety concerns, children are not allowed in the ultrasound rooms during exams. Our front office staff cannot provide observation of children in our lobby area while testing is being conducted. An adult accompanying a patient to their appointment will only be allowed in the ultrasound room at the discretion of the ultrasound technician under special circumstances. We apologize for any inconvenience.    Follow-Up: At [facility], you and your health needs are our priority.  As part of our continuing mission to provide you with exceptional heart care, we have created designated Provider Care Teams.  These Care Teams include your  primary Cardiologist (physician) and Advanced Practice Providers (APPs -  Physician Assistants and Nurse Practitioners) who all work together to provide you with the care you need, when you need it.  We recommend signing up for the patient portal called "MyChart".  Sign up information is provided on this After Visit Summary.  MyChart is used to connect with patients for Virtual Visits (Telemedicine).  Patients are able to view lab/test results, encounter notes, upcoming appointments, etc.  Non-urgent messages can be sent to your provider as well.   To learn more about what you can do with MyChart, go to [email or web address].    Your next appointment:   6 month(s)  The format for your next appointment:   In Person  Provider:   Gypsy Balsam, MD    Other Instructions NA

## 2023-10-04 NOTE — Progress Notes (Signed)
Cardiology Office Note:    Date:  10/04/2023   ID:  Fernando Lee, DOB 11/11/1945, MRN [id number]  PCP:  Jackelyn Poling, DO  Cardiologist:  Gypsy Balsam, MD    Referring MD: Jackelyn Poling, DO   Chief Complaint  Patient presents with   Results    History of Present Illness:    Fernando Lee is a 77 y.o. male past medical history significant for ventricular tachycardia evaluation for this arrhythmia included echocardiogram showing preserved ejection fraction stress test being negative because probably he have his middle dysplastic syndrome and also recently he suffered from shingles he needed to be hospitalized did receive antiviral recovered quite nicely comes today to months for follow-up.  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   Internal hemorrhoids    Kidney stones    Male erectile dysfunction, unspecified    Mixed dyslipidemia    Mixed hyperlipidemia    Ocular migraine    PAC (premature atrial contraction)    Personal history of colonic polyps    Prediabetes    Residual hemorrhoidal skin tags    Spleen enlarged    nov 2023 hospitalized   Unspecified hemorrhoids     Past Surgical History:  Procedure Laterality Date   BONE MARROW BIOPSY     IR IMAGING GUIDED PORT INSERTION  09/14/2022   KIDNEY STONE SURGERY     retrieval    Current Medications: Current Meds  Medication Sig   acetaminophen (TYLENOL) 325 MG tablet Take 2 tablets (650 mg total) by mouth every 6 (six) hours as needed for mild pain (pain score 1-3) (or Fever >/= 101).   alfuzosin (UROXATRAL) 10 MG 24 hr  tablet Take 10 mg by mouth daily with breakfast.   B Complex Vitamins (B COMPLEX PO) Take 1 tablet by mouth daily.   Cholecalciferol (VITAMIN D3) 50 MCG (2000 UT) TABS Take 2,000 Units by mouth daily.   docusate sodium (COLACE) 100 MG capsule Take 100 mg by mouth 2 (two) times daily.   fish oil-omega-3 fatty acids 1000 MG capsule Take 1 g by mouth daily.   furosemide (LASIX) 20 MG tablet Take 20 mg by mouth daily.   GLUCOSAMINE CHONDROITIN MSM PO Take 1 tablet by mouth daily.   Multiple Vitamin (MULTIVITAMIN) tablet Take 1 tablet by mouth daily with breakfast.   pacritinib citrate (VONJO) 100 MG capsule Take 200 mg by mouth daily.   potassium chloride (KLOR-CON) 20 MEQ packet Take 10 mEq by mouth daily.   Turmeric (QC TUMERIC COMPLEX PO) Take 750 mg by mouth daily.   vitamin C (ASCORBIC ACID) 250 MG tablet Take 500 mg by mouth daily.   [DISCONTINUED] ibuprofen (ADVIL) 200 MG tablet Take 100-200 mg by mouth every 6 (six) hours as needed for mild pain (pain score 1-3), headache or fever.   [DISCONTINUED] senna-docusate (SENNA S) 8.6-50 MG tablet Take 2 tablets by mouth at bedtime.   [DISCONTINUED] traMADol (ULTRAM) 50 MG tablet Take 50 mg by mouth every 6 (six) hours as needed  for moderate pain (pain score 4-6) or severe pain (pain score 7-10).     Allergies:   Patient has no known allergies.   Social History   Socioeconomic History   Marital status: Married    Spouse name: Not on file   Number of children: Not on file   Years of education: Not on file   Highest education level: Not on file  Occupational History   Not on file  Tobacco Use   Smoking status: Former    Types: Cigarettes   Smokeless tobacco: Never  Substance and Sexual Activity   Alcohol use: Yes    Alcohol/week: 1.0 standard drink of alcohol    Types: 1 Cans of beer per week   Drug use: Not on file   Sexual activity: Not on file  Other Topics Concern   Not on file  Social History Narrative   Not on file    Social Drivers of Health   Financial Resource Strain: Not on file  Food Insecurity: Patient Declined (09/16/2023)   Hunger Vital Sign    Worried About Running Out of Food in the Last Year: Patient declined    Ran Out of Food in the Last Year: Patient declined  Transportation Needs: Patient Declined (09/16/2023)   PRAPARE - Administrator, Civil Service (Medical): Patient declined    Lack of Transportation (Non-Medical): Patient declined  Physical Activity: Not on file  Stress: Not on file  Social Connections: Not on file     Family History: The patient's family history includes Congestive Heart Failure in his brother; Stroke in his brother. ROS:   Please see the history of present illness.    All 14 point review of systems negative except as described per history of present illness  EKGs/Labs/Other Studies Reviewed:    EKG Interpretation Date/Time:  Tuesday October 04 2023 10:44:36 EST Ventricular Rate:  61 PR Interval:  140 QRS Duration:  94 QT Interval:  412 QTC Calculation: 414 R Axis:   -34  Text Interpretation: Normal sinus rhythm Left axis deviation When compared with ECG of 14-Jul-2023 10:08, No significant change was found Confirmed by Gypsy Balsam [phone]) on 10/04/2023 10:52:40 AM    Recent Labs: 09/17/2023: ALT 46 09/19/2023: BUN 16; Creatinine, Ser 0.81; Potassium 4.1; Sodium 135 09/29/2023: Hemoglobin 7.2; Platelet Count 98  Recent Lipid Panel No results found for: "CHOL", "TRIG", "HDL", "CHOLHDL", "VLDL", "LDLCALC", "LDLDIRECT"  Physical Exam:    VS:  BP (!) 100/50 (BP Location: Right Arm, Patient Position: Sitting)   Pulse 60   Ht 5\' 7"  (1.702 m)   Wt 163 lb (73.9 kg)   SpO2 96%   BMI 25.53 kg/m     Wt Readings from Last 3 Encounters:  10/04/23 163 lb (73.9 kg)  09/16/23 157 lb (71.2 kg)  09/08/23 162 lb 4 oz (73.6 kg)     GEN:  Well nourished, well developed in no acute distress HEENT: Normal NECK: No JVD; No carotid  bruits LYMPHATICS: No lymphadenopathy CARDIAC: RRR, no murmurs, no rubs, no gallops RESPIRATORY:  Clear to auscultation without rales, wheezing or rhonchi  ABDOMEN: Soft, non-tender, non-distended MUSCULOSKELETAL:  No edema; No deformity  SKIN: Warm and dry LOWER EXTREMITIES: no swelling NEUROLOGIC:  Alert and oriented x 3 PSYCHIATRIC:  Normal affect   ASSESSMENT:    1. Nonsustained ventricular tachycardia (HCC)   2. Hardening of the aorta (main artery of the heart) (HCC)   3. Chest discomfort    PLAN:  In order of problems listed above:  Nonsustained ventricular tachycardia denies having dizziness or palpitation will repeat echocardiogram to recheck left ventricle ejection fraction. Atherosclerosis of the aorta not surprising.  I did review K PN which show me his cholesterol LDL 45 HDL 25.  Continue present management. Chest discomfort denies having any. Myelodysplastic syndrome he is trying new medication   Medication Adjustments/Labs and Tests Ordered: Current medicines are reviewed at length with the patient today.  Concerns regarding medicines are outlined above.  Orders Placed This Encounter  Procedures   EKG 12-Lead   Medication changes: No orders of the defined types were placed in this encounter.   Signed, Georgeanna Lea, MD, [facility] 10/04/2023 11:11 AM    [facility]

## 2023-10-06 ENCOUNTER — Other Ambulatory Visit: Payer: Medicare Other

## 2023-10-06 ENCOUNTER — Other Ambulatory Visit (HOSPITAL_COMMUNITY): Payer: Self-pay

## 2023-10-07 ENCOUNTER — Other Ambulatory Visit (HOSPITAL_COMMUNITY): Payer: Self-pay

## 2023-10-07 ENCOUNTER — Other Ambulatory Visit: Payer: Self-pay

## 2023-10-07 ENCOUNTER — Inpatient Hospital Stay (HOSPITAL_BASED_OUTPATIENT_CLINIC_OR_DEPARTMENT_OTHER): Payer: Medicare Other | Admitting: Hematology

## 2023-10-07 ENCOUNTER — Telehealth: Payer: Self-pay

## 2023-10-07 ENCOUNTER — Inpatient Hospital Stay: Payer: Medicare Other

## 2023-10-07 VITALS — BP 130/70 | HR 72 | Temp 97.8°F | Resp 16

## 2023-10-07 DIAGNOSIS — M129 Arthropathy, unspecified: Secondary | ICD-10-CM | POA: Diagnosis not present

## 2023-10-07 DIAGNOSIS — Z87891 Personal history of nicotine dependence: Secondary | ICD-10-CM | POA: Diagnosis not present

## 2023-10-07 DIAGNOSIS — R0602 Shortness of breath: Secondary | ICD-10-CM | POA: Diagnosis not present

## 2023-10-07 DIAGNOSIS — D72829 Elevated white blood cell count, unspecified: Secondary | ICD-10-CM | POA: Diagnosis not present

## 2023-10-07 DIAGNOSIS — R109 Unspecified abdominal pain: Secondary | ICD-10-CM | POA: Diagnosis not present

## 2023-10-07 DIAGNOSIS — D469 Myelodysplastic syndrome, unspecified: Secondary | ICD-10-CM

## 2023-10-07 DIAGNOSIS — D464 Refractory anemia, unspecified: Secondary | ICD-10-CM | POA: Diagnosis not present

## 2023-10-07 DIAGNOSIS — D539 Nutritional anemia, unspecified: Secondary | ICD-10-CM | POA: Diagnosis not present

## 2023-10-07 DIAGNOSIS — R5383 Other fatigue: Secondary | ICD-10-CM | POA: Diagnosis not present

## 2023-10-07 DIAGNOSIS — Z860100 Personal history of colon polyps, unspecified: Secondary | ICD-10-CM | POA: Diagnosis not present

## 2023-10-07 DIAGNOSIS — Z8719 Personal history of other diseases of the digestive system: Secondary | ICD-10-CM | POA: Diagnosis not present

## 2023-10-07 DIAGNOSIS — E782 Mixed hyperlipidemia: Secondary | ICD-10-CM | POA: Diagnosis not present

## 2023-10-07 DIAGNOSIS — R161 Splenomegaly, not elsewhere classified: Secondary | ICD-10-CM | POA: Diagnosis not present

## 2023-10-07 DIAGNOSIS — N202 Calculus of kidney with calculus of ureter: Secondary | ICD-10-CM | POA: Diagnosis not present

## 2023-10-07 DIAGNOSIS — G629 Polyneuropathy, unspecified: Secondary | ICD-10-CM | POA: Diagnosis not present

## 2023-10-07 DIAGNOSIS — J90 Pleural effusion, not elsewhere classified: Secondary | ICD-10-CM | POA: Diagnosis not present

## 2023-10-07 DIAGNOSIS — D471 Chronic myeloproliferative disease: Secondary | ICD-10-CM

## 2023-10-07 DIAGNOSIS — Z79899 Other long term (current) drug therapy: Secondary | ICD-10-CM | POA: Diagnosis not present

## 2023-10-07 DIAGNOSIS — K219 Gastro-esophageal reflux disease without esophagitis: Secondary | ICD-10-CM | POA: Diagnosis not present

## 2023-10-07 DIAGNOSIS — D75839 Thrombocytosis, unspecified: Secondary | ICD-10-CM | POA: Diagnosis not present

## 2023-10-07 LAB — CBC WITH DIFFERENTIAL (CANCER CENTER ONLY)
Abs Immature Granulocytes: 0.03 10*3/uL (ref 0.00–0.07)
Basophils Absolute: 0.1 10*3/uL (ref 0.0–0.1)
Basophils Relative: 2 %
Eosinophils Absolute: 0.4 10*3/uL (ref 0.0–0.5)
Eosinophils Relative: 9 %
HCT: 19.6 % — ABNORMAL LOW (ref 39.0–52.0)
Hemoglobin: 6.7 g/dL — CL (ref 13.0–17.0)
Immature Granulocytes: 1 %
Lymphocytes Relative: 12 %
Lymphs Abs: 0.6 10*3/uL — ABNORMAL LOW (ref 0.7–4.0)
MCH: 29.9 pg (ref 26.0–34.0)
MCHC: 34.2 g/dL (ref 30.0–36.0)
MCV: 87.5 fL (ref 80.0–100.0)
Monocytes Absolute: 0.5 10*3/uL (ref 0.1–1.0)
Monocytes Relative: 11 %
Neutro Abs: 3.2 10*3/uL (ref 1.7–7.7)
Neutrophils Relative %: 65 %
Platelet Count: 108 10*3/uL — ABNORMAL LOW (ref 150–400)
RBC: 2.24 MIL/uL — ABNORMAL LOW (ref 4.22–5.81)
RDW: 13.6 % (ref 11.5–15.5)
WBC Count: 4.8 10*3/uL (ref 4.0–10.5)
nRBC: 0 % (ref 0.0–0.2)

## 2023-10-07 LAB — SAMPLE TO BLOOD BANK

## 2023-10-07 LAB — PREPARE RBC (CROSSMATCH)

## 2023-10-07 MED ORDER — HEPARIN SOD (PORK) LOCK FLUSH 100 UNIT/ML IV SOLN
500.0000 [IU] | Freq: Once | INTRAVENOUS | Status: AC
  Administered 2023-10-07: 500 [IU]

## 2023-10-07 MED ORDER — ACETAMINOPHEN 325 MG PO TABS
650.0000 mg | ORAL_TABLET | Freq: Once | ORAL | Status: AC
  Administered 2023-10-07: 650 mg via ORAL
  Filled 2023-10-07: qty 2

## 2023-10-07 MED ORDER — PACRITINIB CITRATE 100 MG PO CAPS
200.0000 mg | ORAL_CAPSULE | Freq: Two times a day (BID) | ORAL | 2 refills | Status: DC
  Filled 2023-10-11 – 2023-11-09 (×2): qty 120, 30d supply, fill #0
  Filled 2023-11-30: qty 120, 30d supply, fill #1
  Filled 2024-01-06: qty 120, 30d supply, fill #2

## 2023-10-07 MED ORDER — METHYLPREDNISOLONE SODIUM SUCC 40 MG IJ SOLR
40.0000 mg | Freq: Once | INTRAMUSCULAR | Status: AC
  Administered 2023-10-07: 40 mg via INTRAVENOUS
  Filled 2023-10-07: qty 1

## 2023-10-07 MED ORDER — SODIUM CHLORIDE 0.9% FLUSH
10.0000 mL | Freq: Once | INTRAVENOUS | Status: AC
  Administered 2023-10-07: 10 mL

## 2023-10-07 MED ORDER — VALACYCLOVIR HCL 500 MG PO TABS
500.0000 mg | ORAL_TABLET | Freq: Two times a day (BID) | ORAL | 5 refills | Status: DC
  Filled 2023-10-07: qty 60, 30d supply, fill #0
  Filled 2023-11-25: qty 60, 30d supply, fill #1
  Filled 2023-12-22: qty 60, 30d supply, fill #2
  Filled 2024-01-26: qty 60, 30d supply, fill #3
  Filled 2024-02-21: qty 60, 30d supply, fill #4
  Filled 2024-03-27: qty 60, 30d supply, fill #5

## 2023-10-07 MED ORDER — SODIUM CHLORIDE 0.9% IV SOLUTION
250.0000 mL | INTRAVENOUS | Status: DC
  Administered 2023-10-07: 100 mL via INTRAVENOUS

## 2023-10-07 NOTE — Progress Notes (Signed)
HEMATOLOGY/ONCOLOGY PROGRESS NOTE:   Date of Service: 08/25/2023  Patient Care Team: Jackelyn Poling, DO as PCP - General (Family Medicine)  CHIEF COMPLAINTS:  Follow-up for continued evaluation and management of JAK2 positive myeloproliferative neoplasm/MDS  INTERVAL HISTORY:  [name] is a 77 y.o. male here for continued evaluation and management of his MPN/MDS with refractory anemia and thrombocytosis.   Patient was last seen by me in 08/25/2023 and he complained of worsened fatigue and SOB with exertion and activity.   Patient is accompanied by his wife during this visit. He was recently diagnosed with shingles. Patient notes he thought he had bug bite at first, but turned out to be shingles. He reports that his shingles started on his gluteal region, which spread to his groin area. He notes his gluteal region was painful and he was having urinary retention due to shingles. He was prescribed acyclovir at discharge. Patient notes that he started developing neuropathy on his legs couple days after getting discharged and after starting acyclovir.   During this visit, patient notes that his shingles are mostly resolved, but he still has pain. He denies any pain near the spleen, new infection issues, fever, chills, night sweats, bone pain, chest pain, back pain, or leg swelling. 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   Internal hemorrhoids    Kidney stones    Male erectile dysfunction, unspecified    Mixed dyslipidemia    Mixed  hyperlipidemia    Ocular migraine    PAC (premature atrial contraction)    Personal history of colonic polyps    Prediabetes    Residual hemorrhoidal skin tags    Spleen enlarged    nov 2023 hospitalized   Unspecified hemorrhoids     SURGICAL HISTORY: Past Surgical History:  Procedure Laterality Date   BONE MARROW BIOPSY     IR IMAGING GUIDED PORT INSERTION  09/14/2022   KIDNEY STONE SURGERY     retrieval    SOCIAL HISTORY: Social History   Socioeconomic History   Marital status: Married    Spouse name: Not on file   Number of children: Not on file   Years of education: Not on file   Highest education level: Not on file  Occupational History   Not on file  Tobacco Use   Smoking status: Former    Types: Cigarettes   Smokeless tobacco: Never  Substance and Sexual Activity   Alcohol use: Yes    Alcohol/week: 1.0 standard drink of alcohol    Types: 1 Cans of beer per week   Drug use: Not on file   Sexual activity: Not on file  Other Topics Concern   Not on file  Social History Narrative   Not on file   Social Drivers of Health   Financial Resource Strain: Not on file  Food Insecurity: Patient Declined (09/16/2023)   Hunger Vital Sign    Worried About Running Out of Food in the  Last Year: Patient declined    Barista in the Last Year: Patient declined  Transportation Needs: Patient Declined (09/16/2023)   PRAPARE - Administrator, Civil Service (Medical): Patient declined    Lack of Transportation (Non-Medical): Patient declined  Physical Activity: Not on file  Stress: Not on file  Social Connections: Not on file  Intimate Partner Violence: Patient Declined (09/16/2023)   Humiliation, Afraid, Rape, and Kick questionnaire    Fear of Current or Ex-Partner: Patient declined    Emotionally Abused: Patient declined    Physically Abused: Patient declined    Sexually Abused: Patient declined    FAMILY HISTORY: Family History  Problem  Relation Age of Onset   Stroke Brother    Congestive Heart Failure Brother     ALLERGIES:  has no known allergies.  MEDICATIONS:  Current Outpatient Medications  Medication Sig Dispense Refill   acetaminophen (TYLENOL) 325 MG tablet Take 2 tablets (650 mg total) by mouth every 6 (six) hours as needed for mild pain (pain score 1-3) (or Fever >/= 101). 30 tablet 0   alfuzosin (UROXATRAL) 10 MG 24 hr tablet Take 10 mg by mouth daily with breakfast.     B Complex Vitamins (B COMPLEX PO) Take 1 tablet by mouth daily.     Cholecalciferol (VITAMIN D3) 50 MCG (2000 UT) TABS Take 2,000 Units by mouth daily.     docusate sodium (COLACE) 100 MG capsule Take 100 mg by mouth 2 (two) times daily.     fish oil-omega-3 fatty acids 1000 MG capsule Take 1 g by mouth daily.     furosemide (LASIX) 20 MG tablet Take 20 mg by mouth daily.     GLUCOSAMINE CHONDROITIN MSM PO Take 1 tablet by mouth daily.     Multiple Vitamin (MULTIVITAMIN) tablet Take 1 tablet by mouth daily with breakfast.     pacritinib citrate (VONJO) 100 MG capsule Take 200 mg by mouth daily.     potassium chloride (KLOR-CON) 20 MEQ packet Take 10 mEq by mouth daily.     Turmeric (QC TUMERIC COMPLEX PO) Take 750 mg by mouth daily.     vitamin C (ASCORBIC ACID) 250 MG tablet Take 500 mg by mouth daily.     No current facility-administered medications for this visit.    REVIEW OF SYSTEMS:    10 Point review of Systems was done is negative except as noted above.   PHYSICAL EXAMINATION: VSS GENERAL:alert, in no acute distress and comfortable SKIN: no acute rashes, no significant lesions EYES: conjunctiva are pink and non-injected, sclera anicteric OROPHARYNX: MMM, no exudates, no oropharyngeal erythema or ulceration NECK: supple, no JVD LYMPH:  no palpable lymphadenopathy in the cervical, axillary or inguinal regions LUNGS: clear to auscultation b/l with normal respiratory effort HEART: regular rate & rhythm ABDOMEN:  normoactive  bowel sounds , non tender, spleen is much less prominent and clinically appears to have shrunk a lot. Extremity: no pedal edema PSYCH: alert & oriented x 3 with fluent speech NEURO: no focal motor/sensory deficits   LABORATORY DATA:  I have reviewed the data as listed .    Latest Ref Rng & Units 10/07/2023    9:55 AM 09/29/2023    8:39 AM 09/20/2023    6:55 PM  CBC  WBC 4.0 - 10.5 K/uL 4.8  3.7  4.3   Hemoglobin 13.0 - 17.0 g/dL 6.7  7.2  7.9   Hematocrit 39.0 - 52.0 % 19.6  21.9  23.1  Platelets 150 - 400 K/uL 108  98  100      Iron/TIBC/Ferritin/ %Sat    Component Value Date/Time   IRON 180 09/30/2022 0924   TIBC 193 (L) 09/30/2022 0924   FERRITIN 1,450 (H) 09/30/2022 0924   IRONPCTSAT 93 (H) 09/30/2022 0924      Latest Ref Rng & Units 09/19/2023    3:49 AM 09/18/2023    3:30 AM 09/17/2023    3:47 AM  CMP  Glucose 70 - 99 mg/dL 027  253  664   BUN 8 - 23 mg/dL 16  19  22    Creatinine 0.61 - 1.24 mg/dL 4.03  4.74  2.59   Sodium 135 - 145 mmol/L 135  135  136   Potassium 3.5 - 5.1 mmol/L 4.1  3.8  4.1   Chloride 98 - 111 mmol/L 103  104  103   CO2 22 - 32 mmol/L 26  26  26    Calcium 8.9 - 10.3 mg/dL 8.5  8.3  8.6   Total Protein 6.5 - 8.1 g/dL   5.4   Total Bilirubin <1.2 mg/dL   1.0   Alkaline Phos 38 - 126 U/L   30   AST 15 - 41 U/L   24   ALT 0 - 44 U/L   46     Lab Results  Component Value Date   LDH 110 01/04/2022    02/23/2021 BCR ABL    02/23/2021 JAK2   Surgical Pathology  CASE: [id number]  PATIENT: Halo Soliman  Bone Marrow Report  Clinical History: MPN with progressive anemia  (BH)  DIAGNOSIS:   BONE MARROW, ASPIRATE, CLOT, CORE:  -Hypercellular bone marrow with features of myeloid neoplasm  -See comment   PERIPHERAL BLOOD:  -Macrocytic anemia  -Leukocytosis  -Thrombocytosis   COMMENT:   The bone marrow/peripheral blood show persistent involvement by  previously known myeloid neoplasm.  There is a myeloproliferative  component  as supported by previous JAK2 positivity.  However, there are  also dyspoietic changes primarily involving the megakaryocytic cell line  with numerous hypolobated/unilobated forms in addition to  dysgranulopoiesis to a lesser extent.  This is associated with  eosinophilia.  It is not entirely clear whether the overall findings  represent a myeloproliferative neoplasm with treatment related changes  or represent a primary myeloproliferative/myelodysplastic neoplasm  including but not limited to myeloid neoplasms with eosinophilia.  Correlation with cytogenetic and FISH studies strongly recommended.      RADIOGRAPHIC STUDIES: I have personally reviewed the radiological images as listed and agreed with the findings in the report. No results found.   IR IMAGING GUIDED PORT INSERTION  Result Date: 09/14/2022 INDICATION: Poor IV access.  Myelodysplastic syndrome EXAM: IMPLANTED PORT A CATH PLACEMENT WITH ULTRASOUND AND FLUOROSCOPIC GUIDANCE MEDICATIONS: None ANESTHESIA/SEDATION: Moderate (conscious) sedation was employed during this procedure. A total of Versed 3 mg and Fentanyl 100 mcg was administered intravenously. Moderate Sedation Time: 20 minutes. The patient's level of consciousness and vital signs were monitored continuously by radiology nursing throughout the procedure under my direct supervision. FLUOROSCOPY TIME:  Fluoroscopic dose; 1 mGy COMPLICATIONS: None immediate. PROCEDURE: The procedure, risks, benefits, and alternatives were explained to the patient. Questions regarding the procedure were encouraged and answered. The patient understands and consents to the procedure. The RIGHT neck and chest were prepped with chlorhexidine in a sterile fashion, and a sterile drape was applied covering the operative field. Maximum barrier sterile technique with sterile gowns and gloves were used for the  procedure. A timeout was performed prior to the initiation of the procedure. Local anesthesia was  provided with 1% lidocaine with epinephrine. After creating a small venotomy incision, a micropuncture kit was utilized to access the internal jugular vein under direct, real-time ultrasound guidance. Ultrasound image documentation was performed. The microwire was kinked to measure appropriate catheter length. A subcutaneous port pocket was then created along the upper chest wall utilizing a combination of sharp and blunt dissection. The pocket was irrigated with sterile saline. A single lumen ISP power injectable port was chosen for placement. The 8 Fr catheter was tunneled from the port pocket site to the venotomy incision. The port was placed in the pocket. The external catheter was trimmed to appropriate length. At the venotomy, an 8 Fr peel-away sheath was placed over a guidewire under fluoroscopic guidance. The catheter was then placed through the sheath and the sheath was removed. Final catheter positioning was confirmed and documented with a fluoroscopic spot radiograph. The port was accessed with a Huber needle, aspirated and flushed with heparinized saline. The port pocket incision was closed with interrupted 3-0 Vicryl suture then Dermabond was applied, including at the venotomy incision. Dressings were placed. The patient tolerated the procedure well without immediate post procedural complication. IMPRESSION: Successful placement of a RIGHT internal jugular approach power injectable Port-A-Cath. The tip of the catheter is positioned within the proximal RIGHT atrium. The catheter is ready for immediate use. Roanna Banning, MD [facility] Radiology Electronically Signed   By: Roanna Banning M.D.   On: 09/14/2022 17:18   CT ANGIO GI BLEED  Result Date: 08/31/2022 CLINICAL DATA:  Acute mesenteric ischemia. Abdominal pain. History of myelodysplastic syndrome. EXAM: CTA ABDOMEN AND PELVIS WITHOUT AND WITH CONTRAST TECHNIQUE: Multidetector CT imaging of the  abdomen and pelvis was performed using the standard protocol during bolus administration of intravenous contrast. Multiplanar reconstructed images and MIPs were obtained and reviewed to evaluate the vascular anatomy. RADIATION DOSE REDUCTION: This exam was performed according to the departmental dose-optimization program which includes automated exposure control, adjustment of the mA and/or kV according to patient size and/or use of iterative reconstruction technique. CONTRAST:  OMNIPAQUE IOHEXOL 350 MG/ML SOLN COMPARISON:  Abdominal ultrasound examination 04/03/2021 FINDINGS: VASCULAR Aorta: Normal caliber abdominal aorta. Scattered atherosclerotic calcifications. No dissection. Celiac: Normal SMA: Normal Renals: Normal IMA: Patent Inflow: Normal Proximal Outflow: Normal Veins: Normal Review of the MIP images confirms the above findings. NON-VASCULAR Lower chest: Small left pleural effusion and bibasilar atelectasis. The heart is normal in size. No pericardial effusion. There is a moderate to large hiatal hernia. Hepatobiliary: No hepatic lesions or intrahepatic biliary dilatation. The gallbladder is unremarkable. No common bile duct dilatation. Pancreas: No mass, inflammation or ductal dilatation. Spleen: Massive splenomegaly. The spleen measures 20 x 16 x 11 cm. No splenic lesions or splenic infarct. Adrenals/Urinary Tract: The left kidney is displaced medially and inferiorly by the enlarged spleen. No worrisome renal lesions,, hydronephrosis or pyelonephritis. There is a lower pole right renal calculus and there are 2 distal left ureteral calculi more proximal calculus measures 4 mm and the more distal calculus measures 5 mm. I do not see any hydroureter or hydronephrosis. Stomach/Bowel: The stomach, duodenum, small bowel and colon are grossly normal. No inflammatory changes, mass lesions or obstructive findings. Lymphatic: No abdominal or pelvic lymphadenopathy. Reproductive: The prostate gland is  enlarged. The seminal vesicles are unremarkable. Other: No pelvic mass or adenopathy. No free pelvic fluid collections. No inguinal mass  or adenopathy. No abdominal wall hernia or subcutaneous lesions. Musculoskeletal: No significant bony findings. IMPRESSION: 1. Normal caliber abdominal aorta and no dissection. The branch vessels are normal. 2. Splenomegaly. 3. Two distal left ureteral calculi but no hydroureter or hydronephrosis. 4. Lower pole right renal calculus. 5. Moderate to large hiatal hernia. 6. Small left pleural effusion and bibasilar atelectasis. Electronically Signed   By: Rudie Meyer M.D.   On: 08/31/2022 18:13     ASSESSMENT & PLAN:   77 y.o. male with   #1 JAK2-positive myeloproliferative neoplasm-primarily presenting with thrombocytosis and associated MDS causing refractory anemia -No polycythemia.  -Mild leukocytosis.  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 Has grade 1 out of 3 reticulin fibrosis.  Uncertain if this is primary or secondary. -LDH has remained within normal limits. 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 -CT Angio GI bleed scan from 09/01/2022 showed kidney stones.and significant splenomegaly  PLAN:  -Labs from today, 10/07/2023, are pending.  -Discussed with the patient that the next plan is to increase Vonjo dosage to full-dose in discussion with Dr Lowella Bandy @ [facility]. Patient agrees.  -We will prescribe full-dose of Vonjo, 200 mg po BID -Will prescribe preventive dose of valtrex -Recommend to wait couple of months before receiving Shingrex medication. -During the physical exam, spleen has decreased in size. Discussed the option of Ultrasound of the spleen before our next visit. Patient agrees.  -Schedule patient for US Abdomen.  -Answered all of patient questions.   -patient reports no major toxicities from Pacritinib   Follow-up: Weekly labs and PRBC transfusion x 1 appointments [place] abd in 3 weeks MD visit in  4 weeks    The total time spent in the appointment was 30 minutes* .  All of the patient's questions were answered with apparent satisfaction. The patient knows to call the clinic with any problems, questions or concerns.   Wyvonnia Lora MD MS AAHIVMS [facility] [place] Hematology/Oncology Physician [facility]  .*Total Encounter Time as defined by the Centers for Medicare and Medicaid Services includes, in addition to the face-to-face time of a patient visit (documented in the note above) non-face-to-face time: obtaining and reviewing outside history, ordering and reviewing medications, tests or procedures, care coordination (communications with other health care professionals or caregivers) and documentation in the medical record.   I,Param Shah,acting as a Neurosurgeon for Wyvonnia Lora, MD.,have documented all relevant documentation on the behalf of Wyvonnia Lora, MD,as directed by  Wyvonnia Lora, MD while in the presence of Wyvonnia Lora, MD.   .I have reviewed the above documentation for accuracy and completeness, and I agree with the above. Johney Maine MD

## 2023-10-07 NOTE — Patient Instructions (Signed)

## 2023-10-07 NOTE — Telephone Encounter (Signed)
CRITICAL VALUE STICKER  CRITICAL VALUE:   hgb 6.7  RECEIVER (on-site recipient of call):  Daneil Dolin, LPN  DATE & TIME NOTIFIED: 10/07/23  10:42  MESSENGER (representative from lab): Lanora Manis  MD NOTIFIED: Wyvonnia Lora, MD  TIME OF NOTIFICATION: 10:44  RESPONSE:

## 2023-10-08 ENCOUNTER — Other Ambulatory Visit: Payer: Self-pay | Admitting: Hematology

## 2023-10-09 LAB — BPAM RBC
Blood Product Expiration Date: 202501152359
Blood Product Expiration Date: 202501162359
ISSUE DATE / TIME: 202412201202
ISSUE DATE / TIME: 202412201202
Unit Type and Rh: 6200
Unit Type and Rh: 6200

## 2023-10-09 LAB — TYPE AND SCREEN
ABO/RH(D): A POS
Antibody Screen: NEGATIVE
Unit division: 0
Unit division: 0

## 2023-10-10 ENCOUNTER — Other Ambulatory Visit: Payer: Self-pay

## 2023-10-10 ENCOUNTER — Other Ambulatory Visit (HOSPITAL_COMMUNITY): Payer: Self-pay

## 2023-10-10 DIAGNOSIS — D469 Myelodysplastic syndrome, unspecified: Secondary | ICD-10-CM

## 2023-10-11 ENCOUNTER — Encounter: Payer: Self-pay | Admitting: Hematology

## 2023-10-11 ENCOUNTER — Other Ambulatory Visit: Payer: Self-pay

## 2023-10-11 ENCOUNTER — Telehealth: Payer: Self-pay | Admitting: Hematology

## 2023-10-11 ENCOUNTER — Telehealth: Payer: Self-pay | Admitting: Pharmacy Technician

## 2023-10-11 NOTE — Telephone Encounter (Signed)
Oral Oncology Patient Advocate Encounter  Prior Authorization for Vonjo has been approved.    PA# [id number] Effective dates: 10/11/23 through 10/17/24  Patient may continue to fill at [facility].    Jinger Neighbors, CPhT-Adv Oncology Pharmacy Patient Advocate [facility] Direct Number: [phone]  Fax: [phone]

## 2023-10-11 NOTE — Telephone Encounter (Signed)
Oral Oncology Patient Advocate Encounter   Received notification that prior authorization for Vonjo is due for renewal.   PA submitted on 10/11/23 Key [id number] Status is pending     Jinger Neighbors, CPhT-Adv Oncology Pharmacy Patient Advocate [facility] Direct Number: [phone]  Fax: [phone]

## 2023-10-11 NOTE — Telephone Encounter (Signed)
Spoke with patient confirming upcoming appointment  

## 2023-10-12 ENCOUNTER — Encounter: Payer: Self-pay | Admitting: Hematology

## 2023-10-13 ENCOUNTER — Ambulatory Visit (INDEPENDENT_AMBULATORY_CARE_PROVIDER_SITE_OTHER): Payer: Medicare Other | Admitting: Internal Medicine

## 2023-10-13 ENCOUNTER — Encounter: Payer: Self-pay | Admitting: Internal Medicine

## 2023-10-13 ENCOUNTER — Other Ambulatory Visit: Payer: Self-pay

## 2023-10-13 VITALS — BP 111/70 | HR 56 | Temp 97.8°F | Wt 162.0 lb

## 2023-10-13 DIAGNOSIS — B029 Zoster without complications: Secondary | ICD-10-CM | POA: Diagnosis not present

## 2023-10-13 NOTE — Progress Notes (Signed)
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   Unspecified abnormal finding in specimens from other organs, systems and tissues 08/27/2021   Nonsustained ventricular tachycardia (HCC) 05/07/2021   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   Myeloproliferative disorder (HCC) 04/09/2021   Obstructive sleep apnea 02/24/2021   Arthritis of right knee    Pain in limb    Chest pain    Colon polyps    Palpitations    Gastroesophageal reflux disease without esophagitis    Hematochezia    Hyperglycemia    Hemorrhoids without complication    ED (erectile dysfunction) of organic origin    Ocular migraine    Atrial premature contractions    History of colonic polyps    Prediabetes    Residual hemorrhoidal skin tags    Unspecified hemorrhoids    Kidney stone    Chest discomfort     Patient's Medications  New Prescriptions   No medications on file  Previous Medications   ACETAMINOPHEN (TYLENOL) 325 MG TABLET    Take 2 tablets (650 mg total) by mouth every 6 (six) hours as needed for mild pain (pain score 1-3) (or Fever >/= 101).   ALFUZOSIN (UROXATRAL) 10 MG 24 HR TABLET    Take 10 mg by mouth daily with breakfast.   B COMPLEX VITAMINS (B COMPLEX PO)    Take 1 tablet by mouth daily.   CHOLECALCIFEROL (VITAMIN D3) 50 MCG (2000 UT) TABS    Take 2,000 Units by mouth daily.   DOCUSATE SODIUM (COLACE) 100 MG CAPSULE    Take 100 mg  by mouth 2 (two) times daily.   FISH OIL-OMEGA-3 FATTY ACIDS 1000 MG CAPSULE    Take 1 g by mouth daily.   FUROSEMIDE (LASIX) 20 MG TABLET    Take 1 tablet by mouth once daily   GLUCOSAMINE CHONDROITIN MSM PO    Take 1 tablet by mouth daily.   MULTIPLE VITAMIN (MULTIVITAMIN) TABLET    Take 1 tablet by mouth daily with breakfast.   PACRITINIB CITRATE (VONJO) 100 MG CAPSULE    Take 2 capsules (200 mg total) by mouth 2 (two) times daily.   POTASSIUM CHLORIDE (KLOR-CON) 20 MEQ PACKET    Take 10 mEq by mouth daily.   TURMERIC (QC TUMERIC COMPLEX PO)    Take 750 mg by mouth daily.   VALACYCLOVIR (VALTREX) 500 MG TABLET    Take 1 tablet (500 mg total) by mouth 2 (two) times daily.   VITAMIN C (ASCORBIC ACID) 250 MG TABLET    Take 500 mg by mouth daily.  Modified Medications   No medications on file  Discontinued Medications   No medications on file    Subjective: 77 year old male with past medical history as below presents for follow-up of shingles.  He was admitted to [facility] for shingles outbreak.  He is on baricitinib for MDS/MPN.  Hospital course complicated by urinary retention.  Initially placed  on acylovir and  then discharged on Valtrex to complete 10 days of therapy. Today 12/27: Pt is doing well. Presents with wife, lesions resolved. Completed valtrex. He was seen by heme-onc and has not re-started paricitinib.     Review of Systems: Review of Systems  All other systems reviewed and are negative.   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/2023   Internal hemorrhoids    Kidney stones    Male erectile dysfunction, unspecified    Mixed dyslipidemia    Mixed hyperlipidemia    Ocular  migraine    PAC (premature atrial contraction)    Personal history of colonic polyps    Prediabetes    Residual hemorrhoidal skin tags    Spleen enlarged    nov 2023 hospitalized   Unspecified hemorrhoids     Social History   Tobacco Use   Smoking status: Former    Types: Cigarettes   Smokeless tobacco: Never  Substance Use Topics   Alcohol use: Yes    Alcohol/week: 1.0 standard drink of alcohol    Types: 1 Cans of beer per week    Family History  Problem Relation Age of Onset   Stroke Brother    Congestive Heart Failure Brother     No Known Allergies  Health Maintenance  Topic Date Due   Hepatitis C Screening  Never done   Zoster Vaccines- Shingrix (1 of 2) Never done   COVID-19 Vaccine (3 - Pfizer risk series) 12/26/2019   Medicare Annual Wellness (AWV)  04/07/2023   INFLUENZA VACCINE  Never done   DTaP/Tdap/Td (2 - Td or Tdap) 03/08/2027   Pneumonia Vaccine 13+ Years old  Completed   HPV VACCINES  Aged Out   Colonoscopy  Discontinued    Objective:  There were no vitals filed for this visit. There is no height or weight on file to calculate BMI.  Physical Exam Constitutional:      General: He is not in acute distress.    Appearance: He is normal weight. He is not toxic-appearing.  HENT:     Head: Normocephalic and atraumatic.     Right Ear: External ear normal.     Left Ear: External ear normal.     Nose: No congestion or rhinorrhea.     Mouth/Throat:     Mouth: Mucous membranes are moist.     Pharynx: Oropharynx is clear.  Eyes:     Extraocular Movements: Extraocular movements intact.     Conjunctiva/sclera: Conjunctivae normal.     Pupils: Pupils are equal, round, and reactive to light.  Cardiovascular:     Rate and Rhythm: Normal rate and regular rhythm.     Heart sounds: No murmur heard.    No friction rub. No gallop.  Pulmonary:     Effort: Pulmonary effort is normal.     Breath sounds: Normal breath sounds.  Abdominal:     General:  Abdomen is flat. Bowel sounds are normal.     Palpations: Abdomen is soft.  Musculoskeletal:        General: No swelling. Normal range of motion.  Cervical back: Normal range of motion and neck supple.  Skin:    General: Skin is warm and dry.  Neurological:     General: No focal deficit present.     Mental Status: He is oriented to person, place, and time.  Psychiatric:        Mood and Affect: Mood normal.     Lab Results Lab Results  Component Value Date   WBC 4.8 10/07/2023   HGB 6.7 (LL) 10/07/2023   HCT 19.6 (L) 10/07/2023   MCV 87.5 10/07/2023   PLT 108 (L) 10/07/2023    Lab Results  Component Value Date   CREATININE 0.81 09/19/2023   BUN 16 09/19/2023   NA 135 09/19/2023   K 4.1 09/19/2023   CL 103 09/19/2023   CO2 26 09/19/2023    Lab Results  Component Value Date   ALT 46 (H) 09/17/2023   AST 24 09/17/2023   ALKPHOS 30 (L) 09/17/2023   BILITOT 1.0 09/17/2023    No results found for: "CHOL", "HDL", "LDLCALC", "LDLDIRECT", "TRIG", "CHOLHDL" Lab Results  Component Value Date   LABRPR NON REACTIVE 09/18/2023   No results found for: "HIV1RNAQUANT", "HIV1RNAVL", "CD4TABS"   Problem List Items Addressed This Visit   None  Results          Assessment/Plan #Gluteal/genital lesion consistent with shingles in immunosuppressed pt->lesions resolved #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 Candise Che hematology/oncology in 12/21: Rx foll dose Pacritinib, valtrex ppx, wait a couple months prior to shingrex. Patient plans to transition to full dose baricitinib. From the ID perspective as the lesions have resolved, ok to re-start pacritinib. Agree with valtrex 2nd PPX. -F/U PRN       Danelle Earthly, MD [facility] 10/13/2023, 11:08 AM   I have personally spent 42 minutes involved in face-to-face and non-face-to-face activities for this patient on the day of the visit. Professional time  spent includes the following activities: Preparing to see the patient (review of tests), Obtaining and/or reviewing separately obtained history (admission/discharge record), Performing a medically appropriate examination and/or evaluation , Ordering medications/tests/procedures, referring and communicating with other health care professionals, Documenting clinical information in the EMR, Independently interpreting results (not separately reported), Communicating results to the patient/family/caregiver, Counseling and educating the patient/family/caregiver and Care coordination (not separately reported).

## 2023-10-14 ENCOUNTER — Inpatient Hospital Stay: Payer: Medicare Other

## 2023-10-14 ENCOUNTER — Other Ambulatory Visit (HOSPITAL_COMMUNITY): Payer: Self-pay

## 2023-10-14 DIAGNOSIS — D464 Refractory anemia, unspecified: Secondary | ICD-10-CM | POA: Diagnosis not present

## 2023-10-14 DIAGNOSIS — D539 Nutritional anemia, unspecified: Secondary | ICD-10-CM | POA: Diagnosis not present

## 2023-10-14 DIAGNOSIS — Z87891 Personal history of nicotine dependence: Secondary | ICD-10-CM | POA: Diagnosis not present

## 2023-10-14 DIAGNOSIS — R109 Unspecified abdominal pain: Secondary | ICD-10-CM | POA: Diagnosis not present

## 2023-10-14 DIAGNOSIS — D469 Myelodysplastic syndrome, unspecified: Secondary | ICD-10-CM

## 2023-10-14 DIAGNOSIS — D72829 Elevated white blood cell count, unspecified: Secondary | ICD-10-CM | POA: Diagnosis not present

## 2023-10-14 DIAGNOSIS — Z860100 Personal history of colon polyps, unspecified: Secondary | ICD-10-CM | POA: Diagnosis not present

## 2023-10-14 DIAGNOSIS — R5383 Other fatigue: Secondary | ICD-10-CM | POA: Diagnosis not present

## 2023-10-14 DIAGNOSIS — R0602 Shortness of breath: Secondary | ICD-10-CM | POA: Diagnosis not present

## 2023-10-14 DIAGNOSIS — M129 Arthropathy, unspecified: Secondary | ICD-10-CM | POA: Diagnosis not present

## 2023-10-14 DIAGNOSIS — G629 Polyneuropathy, unspecified: Secondary | ICD-10-CM | POA: Diagnosis not present

## 2023-10-14 DIAGNOSIS — Z79899 Other long term (current) drug therapy: Secondary | ICD-10-CM | POA: Diagnosis not present

## 2023-10-14 DIAGNOSIS — D75839 Thrombocytosis, unspecified: Secondary | ICD-10-CM | POA: Diagnosis not present

## 2023-10-14 DIAGNOSIS — R161 Splenomegaly, not elsewhere classified: Secondary | ICD-10-CM | POA: Diagnosis not present

## 2023-10-14 DIAGNOSIS — N202 Calculus of kidney with calculus of ureter: Secondary | ICD-10-CM | POA: Diagnosis not present

## 2023-10-14 DIAGNOSIS — J90 Pleural effusion, not elsewhere classified: Secondary | ICD-10-CM | POA: Diagnosis not present

## 2023-10-14 DIAGNOSIS — K219 Gastro-esophageal reflux disease without esophagitis: Secondary | ICD-10-CM | POA: Diagnosis not present

## 2023-10-14 DIAGNOSIS — Z8719 Personal history of other diseases of the digestive system: Secondary | ICD-10-CM | POA: Diagnosis not present

## 2023-10-14 DIAGNOSIS — E782 Mixed hyperlipidemia: Secondary | ICD-10-CM | POA: Diagnosis not present

## 2023-10-14 LAB — CBC WITH DIFFERENTIAL (CANCER CENTER ONLY)
Abs Immature Granulocytes: 0.04 10*3/uL (ref 0.00–0.07)
Basophils Absolute: 0.2 10*3/uL — ABNORMAL HIGH (ref 0.0–0.1)
Basophils Relative: 4 %
Eosinophils Absolute: 0.6 10*3/uL — ABNORMAL HIGH (ref 0.0–0.5)
Eosinophils Relative: 11 %
HCT: 24.3 % — ABNORMAL LOW (ref 39.0–52.0)
Hemoglobin: 8.1 g/dL — ABNORMAL LOW (ref 13.0–17.0)
Immature Granulocytes: 1 %
Lymphocytes Relative: 10 %
Lymphs Abs: 0.5 10*3/uL — ABNORMAL LOW (ref 0.7–4.0)
MCH: 29.9 pg (ref 26.0–34.0)
MCHC: 33.3 g/dL (ref 30.0–36.0)
MCV: 89.7 fL (ref 80.0–100.0)
Monocytes Absolute: 0.4 10*3/uL (ref 0.1–1.0)
Monocytes Relative: 9 %
Neutro Abs: 3.3 10*3/uL (ref 1.7–7.7)
Neutrophils Relative %: 65 %
Platelet Count: 148 10*3/uL — ABNORMAL LOW (ref 150–400)
RBC: 2.71 MIL/uL — ABNORMAL LOW (ref 4.22–5.81)
RDW: 13.7 % (ref 11.5–15.5)
WBC Count: 5 10*3/uL (ref 4.0–10.5)
nRBC: 0 % (ref 0.0–0.2)

## 2023-10-14 LAB — SAMPLE TO BLOOD BANK

## 2023-10-14 LAB — PREPARE RBC (CROSSMATCH)

## 2023-10-14 MED ORDER — METHYLPREDNISOLONE SODIUM SUCC 40 MG IJ SOLR
40.0000 mg | Freq: Once | INTRAMUSCULAR | Status: AC
  Administered 2023-10-14: 40 mg via INTRAVENOUS
  Filled 2023-10-14: qty 1

## 2023-10-14 MED ORDER — ACETAMINOPHEN 325 MG PO TABS
650.0000 mg | ORAL_TABLET | Freq: Once | ORAL | Status: AC
  Administered 2023-10-14: 650 mg via ORAL
  Filled 2023-10-14: qty 2

## 2023-10-14 MED ORDER — SODIUM CHLORIDE 0.9% FLUSH
10.0000 mL | Freq: Once | INTRAVENOUS | Status: AC
  Administered 2023-10-14: 10 mL

## 2023-10-14 MED ORDER — SODIUM CHLORIDE 0.9% IV SOLUTION
250.0000 mL | INTRAVENOUS | Status: DC
  Administered 2023-10-14: 100 mL via INTRAVENOUS

## 2023-10-14 NOTE — Patient Instructions (Signed)

## 2023-10-17 ENCOUNTER — Other Ambulatory Visit: Payer: Self-pay

## 2023-10-17 LAB — TYPE AND SCREEN
ABO/RH(D): A POS
Antibody Screen: NEGATIVE
Unit division: 0

## 2023-10-17 LAB — BPAM RBC
Blood Product Expiration Date: 202501262359
ISSUE DATE / TIME: 202412271000
Unit Type and Rh: 6200

## 2023-10-18 ENCOUNTER — Other Ambulatory Visit (HOSPITAL_COMMUNITY): Payer: Self-pay

## 2023-10-21 ENCOUNTER — Inpatient Hospital Stay: Payer: Medicare Other | Attending: Hematology

## 2023-10-21 ENCOUNTER — Other Ambulatory Visit: Payer: Self-pay

## 2023-10-21 DIAGNOSIS — D464 Refractory anemia, unspecified: Secondary | ICD-10-CM | POA: Diagnosis not present

## 2023-10-21 DIAGNOSIS — Z79899 Other long term (current) drug therapy: Secondary | ICD-10-CM | POA: Diagnosis not present

## 2023-10-21 DIAGNOSIS — D469 Myelodysplastic syndrome, unspecified: Secondary | ICD-10-CM

## 2023-10-21 LAB — PREPARE RBC (CROSSMATCH)

## 2023-10-21 LAB — CBC WITH DIFFERENTIAL (CANCER CENTER ONLY)
Abs Immature Granulocytes: 0.05 10*3/uL (ref 0.00–0.07)
Basophils Absolute: 0.1 10*3/uL (ref 0.0–0.1)
Basophils Relative: 3 %
Eosinophils Absolute: 0.4 10*3/uL (ref 0.0–0.5)
Eosinophils Relative: 9 %
HCT: 21.9 % — ABNORMAL LOW (ref 39.0–52.0)
Hemoglobin: 7.4 g/dL — ABNORMAL LOW (ref 13.0–17.0)
Immature Granulocytes: 1 %
Lymphocytes Relative: 10 %
Lymphs Abs: 0.5 10*3/uL — ABNORMAL LOW (ref 0.7–4.0)
MCH: 30.1 pg (ref 26.0–34.0)
MCHC: 33.8 g/dL (ref 30.0–36.0)
MCV: 89 fL (ref 80.0–100.0)
Monocytes Absolute: 0.4 10*3/uL (ref 0.1–1.0)
Monocytes Relative: 9 %
Neutro Abs: 3.3 10*3/uL (ref 1.7–7.7)
Neutrophils Relative %: 68 %
Platelet Count: 137 10*3/uL — ABNORMAL LOW (ref 150–400)
RBC: 2.46 MIL/uL — ABNORMAL LOW (ref 4.22–5.81)
RDW: 13.8 % (ref 11.5–15.5)
WBC Count: 4.8 10*3/uL (ref 4.0–10.5)
nRBC: 0 % (ref 0.0–0.2)

## 2023-10-21 LAB — SAMPLE TO BLOOD BANK

## 2023-10-21 MED ORDER — HEPARIN SOD (PORK) LOCK FLUSH 100 UNIT/ML IV SOLN
500.0000 [IU] | Freq: Once | INTRAVENOUS | Status: DC
Start: 2023-10-21 — End: 2023-10-21

## 2023-10-21 MED ORDER — SODIUM CHLORIDE 0.9% FLUSH: 10.0000 mL | Freq: Once | INTRAVENOUS | Status: DC

## 2023-10-22 ENCOUNTER — Inpatient Hospital Stay: Payer: Medicare Other

## 2023-10-22 DIAGNOSIS — Z79899 Other long term (current) drug therapy: Secondary | ICD-10-CM | POA: Diagnosis not present

## 2023-10-22 DIAGNOSIS — D469 Myelodysplastic syndrome, unspecified: Secondary | ICD-10-CM

## 2023-10-22 DIAGNOSIS — D464 Refractory anemia, unspecified: Secondary | ICD-10-CM | POA: Diagnosis not present

## 2023-10-22 MED ORDER — METHYLPREDNISOLONE SODIUM SUCC 40 MG IJ SOLR
40.0000 mg | Freq: Once | INTRAMUSCULAR | Status: AC
  Administered 2023-10-22: 40 mg via INTRAVENOUS
  Filled 2023-10-22: qty 1

## 2023-10-22 MED ORDER — ACETAMINOPHEN 325 MG PO TABS
650.0000 mg | ORAL_TABLET | Freq: Once | ORAL | Status: AC
  Administered 2023-10-22: 650 mg via ORAL
  Filled 2023-10-22: qty 2

## 2023-10-22 MED ORDER — SODIUM CHLORIDE 0.9% IV SOLUTION
250.0000 mL | INTRAVENOUS | Status: DC
  Administered 2023-10-22: 100 mL via INTRAVENOUS

## 2023-10-22 NOTE — Patient Instructions (Signed)

## 2023-10-24 LAB — BPAM RBC
Blood Product Expiration Date: 202501302359
ISSUE DATE / TIME: 202501040923
Unit Type and Rh: 202501302359
Unit Type and Rh: 6200

## 2023-10-24 LAB — TYPE AND SCREEN
ABO/RH(D): A POS
Antibody Screen: NEGATIVE
Unit division: 0

## 2023-10-25 ENCOUNTER — Ambulatory Visit (HOSPITAL_COMMUNITY)
Admission: RE | Admit: 2023-10-25 | Discharge: 2023-10-25 | Disposition: A | Discharge status: Home / Self Care | Payer: Medicare Other | Source: Ambulatory Visit | Attending: Hematology | Admitting: Hematology

## 2023-10-25 DIAGNOSIS — K838 Other specified diseases of biliary tract: Secondary | ICD-10-CM | POA: Diagnosis not present

## 2023-10-25 DIAGNOSIS — D469 Myelodysplastic syndrome, unspecified: Secondary | ICD-10-CM | POA: Diagnosis not present

## 2023-10-25 DIAGNOSIS — R161 Splenomegaly, not elsewhere classified: Secondary | ICD-10-CM | POA: Diagnosis not present

## 2023-10-26 ENCOUNTER — Other Ambulatory Visit: Payer: Self-pay

## 2023-10-26 DIAGNOSIS — R338 Other retention of urine: Secondary | ICD-10-CM | POA: Diagnosis not present

## 2023-10-26 DIAGNOSIS — D469 Myelodysplastic syndrome, unspecified: Secondary | ICD-10-CM

## 2023-10-27 ENCOUNTER — Inpatient Hospital Stay: Payer: Medicare Other

## 2023-10-27 DIAGNOSIS — Z79899 Other long term (current) drug therapy: Secondary | ICD-10-CM | POA: Diagnosis not present

## 2023-10-27 DIAGNOSIS — D469 Myelodysplastic syndrome, unspecified: Secondary | ICD-10-CM

## 2023-10-27 DIAGNOSIS — D464 Refractory anemia, unspecified: Secondary | ICD-10-CM | POA: Diagnosis not present

## 2023-10-27 LAB — CBC WITH DIFFERENTIAL (CANCER CENTER ONLY)
Abs Immature Granulocytes: 0.09 10*3/uL — ABNORMAL HIGH (ref 0.00–0.07)
Basophils Absolute: 0.2 10*3/uL — ABNORMAL HIGH (ref 0.0–0.1)
Basophils Relative: 3 %
Eosinophils Absolute: 0.6 10*3/uL — ABNORMAL HIGH (ref 0.0–0.5)
Eosinophils Relative: 10 %
HCT: 24.1 % — ABNORMAL LOW (ref 39.0–52.0)
Hemoglobin: 8.2 g/dL — ABNORMAL LOW (ref 13.0–17.0)
Immature Granulocytes: 2 %
Lymphocytes Relative: 8 %
Lymphs Abs: 0.5 10*3/uL — ABNORMAL LOW (ref 0.7–4.0)
MCH: 30.5 pg (ref 26.0–34.0)
MCHC: 34 g/dL (ref 30.0–36.0)
MCV: 89.6 fL (ref 80.0–100.0)
Monocytes Absolute: 0.5 10*3/uL (ref 0.1–1.0)
Monocytes Relative: 8 %
Neutro Abs: 4 10*3/uL (ref 1.7–7.7)
Neutrophils Relative %: 69 %
Platelet Count: 161 10*3/uL (ref 150–400)
RBC: 2.69 MIL/uL — ABNORMAL LOW (ref 4.22–5.81)
RDW: 14 % (ref 11.5–15.5)
WBC Count: 5.8 10*3/uL (ref 4.0–10.5)
nRBC: 0 % (ref 0.0–0.2)

## 2023-10-27 LAB — TYPE AND SCREEN
ABO/RH(D): A POS
Antibody Screen: NEGATIVE

## 2023-10-27 MED ORDER — HEPARIN SOD (PORK) LOCK FLUSH 100 UNIT/ML IV SOLN
500.0000 [IU] | Freq: Once | INTRAVENOUS | Status: AC
  Administered 2023-10-27: 500 [IU]

## 2023-10-27 MED ORDER — SODIUM CHLORIDE 0.9% FLUSH
10.0000 mL | Freq: Once | INTRAVENOUS | Status: AC
  Administered 2023-10-27: 10 mL

## 2023-10-27 NOTE — Progress Notes (Signed)
 Patient's hemoglobin is 8.2 and does not qualify for a blood transfusion today per Dr. Candise Che.

## 2023-10-28 ENCOUNTER — Inpatient Hospital Stay: Payer: Medicare Other

## 2023-10-28 ENCOUNTER — Other Ambulatory Visit: Payer: Medicare Other

## 2023-10-28 ENCOUNTER — Other Ambulatory Visit: Payer: Self-pay

## 2023-10-28 NOTE — Progress Notes (Signed)
 Spoke with Fernando Lee's wife [name] and confirmed that [name] just started back taking Vonjo  yesterday, 10/27/23 at increased dose of 100mg  2 BID. [name] confirms that they have at least one full bottle of 60 on hand which will last them 15 days. Pharmacy has updated RX on file that can be filled when they are ready. [name] would like a call back in 7-10 days to schedule the next refill. [name] appreciated the follow-up today.

## 2023-11-02 ENCOUNTER — Other Ambulatory Visit: Payer: Self-pay

## 2023-11-03 ENCOUNTER — Inpatient Hospital Stay: Payer: Medicare Other

## 2023-11-03 ENCOUNTER — Other Ambulatory Visit: Payer: Self-pay

## 2023-11-03 DIAGNOSIS — Z79899 Other long term (current) drug therapy: Secondary | ICD-10-CM | POA: Diagnosis not present

## 2023-11-03 DIAGNOSIS — D469 Myelodysplastic syndrome, unspecified: Secondary | ICD-10-CM

## 2023-11-03 DIAGNOSIS — D464 Refractory anemia, unspecified: Secondary | ICD-10-CM | POA: Diagnosis not present

## 2023-11-03 LAB — CBC WITH DIFFERENTIAL (CANCER CENTER ONLY)
Abs Immature Granulocytes: 0.1 10*3/uL — ABNORMAL HIGH (ref 0.00–0.07)
Basophils Absolute: 0.2 10*3/uL — ABNORMAL HIGH (ref 0.0–0.1)
Basophils Relative: 3 %
Eosinophils Absolute: 0.5 10*3/uL (ref 0.0–0.5)
Eosinophils Relative: 10 %
HCT: 21.3 % — ABNORMAL LOW (ref 39.0–52.0)
Hemoglobin: 7.1 g/dL — ABNORMAL LOW (ref 13.0–17.0)
Immature Granulocytes: 2 %
Lymphocytes Relative: 10 %
Lymphs Abs: 0.5 10*3/uL — ABNORMAL LOW (ref 0.7–4.0)
MCH: 29.8 pg (ref 26.0–34.0)
MCHC: 33.3 g/dL (ref 30.0–36.0)
MCV: 89.5 fL (ref 80.0–100.0)
Monocytes Absolute: 0.4 10*3/uL (ref 0.1–1.0)
Monocytes Relative: 7 %
Neutro Abs: 3.5 10*3/uL (ref 1.7–7.7)
Neutrophils Relative %: 68 %
Platelet Count: 179 10*3/uL (ref 150–400)
RBC: 2.38 MIL/uL — ABNORMAL LOW (ref 4.22–5.81)
RDW: 13.9 % (ref 11.5–15.5)
WBC Count: 5.1 10*3/uL (ref 4.0–10.5)
nRBC: 0 % (ref 0.0–0.2)

## 2023-11-03 LAB — SAMPLE TO BLOOD BANK

## 2023-11-03 LAB — PREPARE RBC (CROSSMATCH)

## 2023-11-03 MED ORDER — HEPARIN SOD (PORK) LOCK FLUSH 100 UNIT/ML IV SOLN
500.0000 [IU] | Freq: Every day | INTRAVENOUS | Status: AC | PRN
Start: 2023-11-03 — End: 2023-11-03
  Administered 2023-11-03: 500 [IU]

## 2023-11-03 MED ORDER — SODIUM CHLORIDE 0.9% FLUSH
10.0000 mL | INTRAVENOUS | Status: AC | PRN
  Administered 2023-11-03: 10 mL

## 2023-11-03 MED ORDER — SODIUM CHLORIDE 0.9% IV SOLUTION
250.0000 mL | INTRAVENOUS | Status: DC
  Administered 2023-11-03: 100 mL via INTRAVENOUS

## 2023-11-03 MED ORDER — METHYLPREDNISOLONE SODIUM SUCC 40 MG IJ SOLR
40.0000 mg | Freq: Once | INTRAMUSCULAR | Status: AC
  Administered 2023-11-03: 40 mg via INTRAVENOUS
  Filled 2023-11-03: qty 1

## 2023-11-03 MED ORDER — ACETAMINOPHEN 325 MG PO TABS
650.0000 mg | ORAL_TABLET | Freq: Once | ORAL | Status: AC
  Administered 2023-11-03: 650 mg via ORAL
  Filled 2023-11-03: qty 2

## 2023-11-03 NOTE — Patient Instructions (Signed)

## 2023-11-04 ENCOUNTER — Inpatient Hospital Stay: Payer: Medicare Other

## 2023-11-04 ENCOUNTER — Other Ambulatory Visit: Payer: Medicare Other

## 2023-11-04 LAB — TYPE AND SCREEN
ABO/RH(D): A POS
Antibody Screen: NEGATIVE
Unit division: 0

## 2023-11-04 LAB — BPAM RBC
Blood Product Expiration Date: 202502072359
ISSUE DATE / TIME: 202501160950
Unit Type and Rh: 6200

## 2023-11-05 ENCOUNTER — Other Ambulatory Visit: Payer: Self-pay | Admitting: Hematology

## 2023-11-08 ENCOUNTER — Other Ambulatory Visit (HOSPITAL_COMMUNITY): Payer: Self-pay

## 2023-11-09 ENCOUNTER — Other Ambulatory Visit: Payer: Self-pay

## 2023-11-09 ENCOUNTER — Encounter: Payer: Self-pay | Admitting: Hematology

## 2023-11-09 NOTE — Progress Notes (Signed)
Specialty Pharmacy Ongoing Clinical Assessment Note  Fernando Lee is a 79 y.o. male who is being followed by the specialty pharmacy service for RxSp Oncology   Patient's specialty medication(s) reviewed today: Pacritinib Citrate (VONJO)   Missed doses in the last 4 weeks: 0   Patient/Caregiver did not have any additional questions or concerns.   Therapeutic benefit summary: Unable to assess   Adverse events/side effects summary: No adverse events/side effects   Patient's therapy is appropriate to: Continue    Goals Addressed             This Visit's Progress    Stabilization of disease       Patient is initiating therapy. Patient will be evaluated at upcoming provider appointment to assess progress         Follow up:  3 months  Bobette Mo Specialty Pharmacist

## 2023-11-09 NOTE — Progress Notes (Signed)
Specialty Pharmacy Refill Coordination Note  Fernando Lee is a 78 y.o. male contacted today regarding refills of specialty medication(s) Pacritinib Citrate [name])   Patient requested [name] at [facility] at [facility] date: 11/11/23   Medication will be filled on 11/10/23.

## 2023-11-10 ENCOUNTER — Other Ambulatory Visit (HOSPITAL_COMMUNITY): Payer: Self-pay

## 2023-11-11 ENCOUNTER — Inpatient Hospital Stay: Payer: Medicare Other

## 2023-11-11 ENCOUNTER — Other Ambulatory Visit: Payer: Medicare Other

## 2023-11-11 ENCOUNTER — Other Ambulatory Visit: Payer: Self-pay

## 2023-11-11 ENCOUNTER — Inpatient Hospital Stay: Payer: Medicare Other | Admitting: Hematology

## 2023-11-11 ENCOUNTER — Encounter: Payer: Self-pay | Admitting: Hematology

## 2023-11-11 ENCOUNTER — Other Ambulatory Visit (HOSPITAL_COMMUNITY): Payer: Self-pay

## 2023-11-11 VITALS — BP 113/54 | HR 64 | Temp 98.3°F | Resp 14 | Wt 168.3 lb

## 2023-11-11 VITALS — BP 107/59 | HR 98 | Temp 98.0°F | Resp 16

## 2023-11-11 DIAGNOSIS — D471 Chronic myeloproliferative disease: Secondary | ICD-10-CM | POA: Diagnosis not present

## 2023-11-11 DIAGNOSIS — Z79899 Other long term (current) drug therapy: Secondary | ICD-10-CM | POA: Diagnosis not present

## 2023-11-11 DIAGNOSIS — D469 Myelodysplastic syndrome, unspecified: Secondary | ICD-10-CM

## 2023-11-11 DIAGNOSIS — D649 Anemia, unspecified: Secondary | ICD-10-CM

## 2023-11-11 DIAGNOSIS — D464 Refractory anemia, unspecified: Secondary | ICD-10-CM | POA: Diagnosis not present

## 2023-11-11 LAB — CBC WITH DIFFERENTIAL (CANCER CENTER ONLY)
Abs Immature Granulocytes: 0.12 10*3/uL — ABNORMAL HIGH (ref 0.00–0.07)
Basophils Absolute: 0.2 10*3/uL — ABNORMAL HIGH (ref 0.0–0.1)
Basophils Relative: 3 %
Eosinophils Absolute: 0.5 10*3/uL (ref 0.0–0.5)
Eosinophils Relative: 9 %
HCT: 21.5 % — ABNORMAL LOW (ref 39.0–52.0)
Hemoglobin: 7.1 g/dL — ABNORMAL LOW (ref 13.0–17.0)
Immature Granulocytes: 2 %
Lymphocytes Relative: 8 %
Lymphs Abs: 0.5 10*3/uL — ABNORMAL LOW (ref 0.7–4.0)
MCH: 30.3 pg (ref 26.0–34.0)
MCHC: 33 g/dL (ref 30.0–36.0)
MCV: 91.9 fL (ref 80.0–100.0)
Monocytes Absolute: 0.5 10*3/uL (ref 0.1–1.0)
Monocytes Relative: 9 %
Neutro Abs: 4.2 10*3/uL (ref 1.7–7.7)
Neutrophils Relative %: 69 %
Platelet Count: 186 10*3/uL (ref 150–400)
RBC: 2.34 MIL/uL — ABNORMAL LOW (ref 4.22–5.81)
RDW: 14.5 % (ref 11.5–15.5)
WBC Count: 6 10*3/uL (ref 4.0–10.5)
nRBC: 0 % (ref 0.0–0.2)

## 2023-11-11 LAB — SAMPLE TO BLOOD BANK

## 2023-11-11 LAB — PREPARE RBC (CROSSMATCH)

## 2023-11-11 MED ORDER — SODIUM CHLORIDE 0.9% FLUSH
10.0000 mL | Freq: Once | INTRAVENOUS | Status: AC
  Administered 2023-11-11: 10 mL

## 2023-11-11 MED ORDER — HEPARIN SOD (PORK) LOCK FLUSH 100 UNIT/ML IV SOLN
500.0000 [IU] | Freq: Once | INTRAVENOUS | Status: AC
  Administered 2023-11-11: 500 [IU]

## 2023-11-11 MED ORDER — SODIUM CHLORIDE 0.9% IV SOLUTION
250.0000 mL | INTRAVENOUS | Status: DC
  Administered 2023-11-11: 100 mL via INTRAVENOUS

## 2023-11-11 MED ORDER — METHYLPREDNISOLONE SODIUM SUCC 40 MG IJ SOLR
40.0000 mg | Freq: Once | INTRAMUSCULAR | Status: AC
  Administered 2023-11-11: 40 mg via INTRAVENOUS
  Filled 2023-11-11: qty 1

## 2023-11-11 MED ORDER — ACETAMINOPHEN 325 MG PO TABS
650.0000 mg | ORAL_TABLET | Freq: Once | ORAL | Status: AC
  Administered 2023-11-11: 650 mg via ORAL
  Filled 2023-11-11: qty 2

## 2023-11-11 NOTE — Progress Notes (Signed)
HEMATOLOGY/ONCOLOGY PROGRESS NOTE:   Date of Service: 11/11/23  Patient Care Team: Jackelyn Poling, DO as PCP - General (Family Medicine)  CHIEF COMPLAINTS:  Follow-up for continued evaluation and management of JAK2 positive myeloproliferative neoplasm/MDS  INTERVAL HISTORY:  [name] is a 78 y.o. male here for continued evaluation and management of his MPN/MDS with refractory anemia and thrombocytosis.   Patient was last seen by me on 10/07/2023 and reported a recent diagnosis of shingles, which began in the gluteal region and spread to the groin and caused urinary retention. His shingles infection continued to be painful at the time of the clinical visit. Patient also complained of lethargy/fatigue and neuropathy in the leg.  Today, he is accompanied by his wife. Patient has tolerated Vonjo 200 mg po BID without any major toxicity issues.   He reports that he particular feels fairly poor overall today. His energy levels due improve after receiving blood transfusions. Patient reports a noticeable difference in energy levels when his hgb is at least 8. He reports that when his hgb levels are low, he endorses memory issues, such as forgetting certain individual's names.   He reports that a couple hours after receiving a blood transfusion, he endorses a "sick feeling" in his stomach, which he does not necessarily describe as nausea, lasting several hours. Patient denies any vomiting. He does not take nausea medication, and generally rests and elevates legs to improve symptoms. He notes that he does use steroids as pre medication. He denies nausea with consumption of fatty foods. Patient denies any diarrhea.   He denies any abdominal pain or pain over the gallbladder. His wife notes that patient was found to have sludge in his gallbladder in previous imaging.   Patient denies vomiting, diarrhea, unusual food intake, infection issues, lightheadedness, dizziness, syncope episodes,  abnormal bleeding, new medications,   He denies any pain in his previous shingles area.  Patient does take acyclovier for prevention.   He reports that he will be seen by Dr. Lowella Bandy in March 2025.   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   Internal hemorrhoids    Kidney stones    Male erectile dysfunction, unspecified    Mixed dyslipidemia    Mixed hyperlipidemia    Ocular migraine    PAC (premature atrial contraction)    Personal history of colonic polyps    Prediabetes    Residual hemorrhoidal skin tags    Spleen enlarged    nov 2023 hospitalized   Unspecified hemorrhoids     SURGICAL HISTORY: Past Surgical History:  Procedure Laterality Date   BONE MARROW BIOPSY     IR IMAGING GUIDED PORT INSERTION  09/14/2022   KIDNEY STONE SURGERY     retrieval    SOCIAL HISTORY: Social History   Socioeconomic History   Marital status: Married    Spouse name: Not on file   Number of children: Not on file   Years of education: Not on file   Highest education level: Not on file  Occupational History   Not on file  Tobacco  Use   Smoking status: Former    Types: Cigarettes   Smokeless tobacco: Never  Substance and Sexual Activity   Alcohol use: Yes    Alcohol/week: 1.0 standard drink of alcohol    Types: 1 Cans of beer per week   Drug use: Not on file   Sexual activity: Not on file  Other Topics Concern   Not on file  Social History Narrative   Not on file   Social Drivers of Health   Financial Resource Strain: Not on file  Food Insecurity: Patient Declined (09/16/2023)   Hunger Vital Sign    Worried About Running Out of Food in the Last Year:  Patient declined    Ran Out of Food in the Last Year: Patient declined  Transportation Needs: Patient Declined (09/16/2023)   PRAPARE - Administrator, Civil Service (Medical): Patient declined    Lack of Transportation (Non-Medical): Patient declined  Physical Activity: Not on file  Stress: Not on file  Social Connections: Not on file  Intimate Partner Violence: Patient Declined (09/16/2023)   Humiliation, Afraid, Rape, and Kick questionnaire    Fear of Current or Ex-Partner: Patient declined    Emotionally Abused: Patient declined    Physically Abused: Patient declined    Sexually Abused: Patient declined    FAMILY HISTORY: Family History  Problem Relation Age of Onset   Stroke Brother    Congestive Heart Failure Brother     ALLERGIES:  has no known allergies.  MEDICATIONS:  Current Outpatient Medications  Medication Sig Dispense Refill   acetaminophen (TYLENOL) 325 MG tablet Take 2 tablets (650 mg total) by mouth every 6 (six) hours as needed for mild pain (pain score 1-3) (or Fever >/= 101). 30 tablet 0   alfuzosin (UROXATRAL) 10 MG 24 hr tablet Take 10 mg by mouth daily with breakfast.     B Complex Vitamins (B COMPLEX PO) Take 1 tablet by mouth daily.     Cholecalciferol (VITAMIN D3) 50 MCG (2000 UT) TABS Take 2,000 Units by mouth daily.     docusate sodium (COLACE) 100 MG capsule Take 100 mg by mouth 2 (two) times daily.     fish oil-omega-3 fatty acids 1000 MG capsule Take 1 g by mouth daily.     furosemide (LASIX) 20 MG tablet Take 1 tablet by mouth once daily 30 tablet 0   GLUCOSAMINE CHONDROITIN MSM PO Take 1 tablet by mouth daily.     Multiple Vitamin (MULTIVITAMIN) tablet Take 1 tablet by mouth daily with breakfast.     pacritinib citrate (VONJO) 100 MG capsule Take 2 capsules (200 mg total) by mouth 2 (two) times daily. 120 capsule 2   potassium chloride (KLOR-CON) 20 MEQ packet Take 10 mEq by mouth daily.     Turmeric (QC TUMERIC COMPLEX PO) Take 750  mg by mouth daily.     valACYclovir (VALTREX) 500 MG tablet Take 1 tablet (500 mg total) by mouth 2 (two) times daily. 60 tablet 5   vitamin C (ASCORBIC ACID) 250 MG tablet Take 500 mg by mouth daily.     No current facility-administered medications for this visit.    REVIEW OF SYSTEMS:    10 Point review of Systems was done is negative except as noted above.   PHYSICAL EXAMINATION:  .BP (!) 113/54 (BP Location: Right Arm, Patient Position: Sitting) Comment: Nurse was notified  Pulse 64   Temp 98.3 F (36.8 C) (Temporal)   Resp 14   Wt  168 lb 4.8 oz (76.3 kg)   SpO2 99%   BMI 26.36 kg/m   GENERAL:alert, in no acute distress and comfortable SKIN: no acute rashes, no significant lesions EYES: conjunctiva are pink and non-injected, sclera anicteric OROPHARYNX: MMM, no exudates, no oropharyngeal erythema or ulceration NECK: supple, no JVD LYMPH:  no palpable lymphadenopathy in the cervical, axillary or inguinal regions LUNGS: clear to auscultation b/l with normal respiratory effort HEART: regular rate & rhythm ABDOMEN:  normoactive bowel sounds , non tender, not distended. Extremity: no pedal edema PSYCH: alert & oriented x 3 with fluent speech NEURO: no focal motor/sensory deficits   LABORATORY DATA:  I have reviewed the data as listed .    Latest Ref Rng & Units 11/03/2023    7:55 AM 10/27/2023    7:51 AM 10/21/2023    2:09 PM  CBC  WBC 4.0 - 10.5 K/uL 5.1  5.8  4.8   Hemoglobin 13.0 - 17.0 g/dL 7.1  8.2  7.4   Hematocrit 39.0 - 52.0 % 21.3  24.1  21.9   Platelets 150 - 400 K/uL 179  161  137      Iron/TIBC/Ferritin/ %Sat    Component Value Date/Time   IRON 180 09/30/2022 0924   TIBC 193 (L) 09/30/2022 0924   FERRITIN 1,450 (H) 09/30/2022 0924   IRONPCTSAT 93 (H) 09/30/2022 0924      Latest Ref Rng & Units 09/19/2023    3:49 AM 09/18/2023    3:30 AM 09/17/2023    3:47 AM  CMP  Glucose 70 - 99 mg/dL 161  096  045   BUN 8 - 23 mg/dL 16  19  22    Creatinine  0.61 - 1.24 mg/dL 4.09  8.11  9.14   Sodium 135 - 145 mmol/L 135  135  136   Potassium 3.5 - 5.1 mmol/L 4.1  3.8  4.1   Chloride 98 - 111 mmol/L 103  104  103   CO2 22 - 32 mmol/L 26  26  26    Calcium 8.9 - 10.3 mg/dL 8.5  8.3  8.6   Total Protein 6.5 - 8.1 g/dL   5.4   Total Bilirubin <1.2 mg/dL   1.0   Alkaline Phos 38 - 126 U/L   30   AST 15 - 41 U/L   24   ALT 0 - 44 U/L   46     Lab Results  Component Value Date   LDH 110 01/04/2022    02/23/2021 BCR ABL    02/23/2021 JAK2   Surgical Pathology  CASE: [id number]  PATIENT: Damel Deutscher  Bone Marrow Report  Clinical History: MPN with progressive anemia  (BH)  DIAGNOSIS:   BONE MARROW, ASPIRATE, CLOT, CORE:  -Hypercellular bone marrow with features of myeloid neoplasm  -See comment   PERIPHERAL BLOOD:  -Macrocytic anemia  -Leukocytosis  -Thrombocytosis   COMMENT:   The bone marrow/peripheral blood show persistent involvement by  previously known myeloid neoplasm.  There is a myeloproliferative  component as supported by previous JAK2 positivity.  However, there are  also dyspoietic changes primarily involving the megakaryocytic cell line  with numerous hypolobated/unilobated forms in addition to  dysgranulopoiesis to a lesser extent.  This is associated with  eosinophilia.  It is not entirely clear whether the overall findings  represent a myeloproliferative neoplasm with treatment related changes  or represent a primary myeloproliferative/myelodysplastic neoplasm  including but not limited to myeloid neoplasms with eosinophilia.  Correlation with cytogenetic and  FISH studies strongly recommended.      RADIOGRAPHIC STUDIES: I have personally reviewed the radiological images as listed and agreed with the findings in the report. US Abdomen Complete Result Date: 10/25/2023 CLINICAL DATA:  Evaluate liver and spleen EXAM: ABDOMEN ULTRASOUND COMPLETE COMPARISON:  Ultrasound abdomen 03/18/2023 FINDINGS:  Gallbladder: Small amount of sludge in the gallbladder lumen. No gallbladder wall thickening or pericholecystic fluid. Negative sonographic Murphy's sign. Common bile duct: Diameter: 7.8 mm, prominent, similar to prior Liver: Increased echogenicity. No focal lesion. Portal vein is patent on color Doppler imaging with normal direction of blood flow towards the liver. IVC: No abnormality visualized. Pancreas: Visualized portion unremarkable. Spleen: Measures 18.8 x 19.7 x 10.3 cm. Right Kidney: Length: 9.8 cm. Echogenicity within normal limits. No mass or hydronephrosis visualized. Left Kidney: Length: 10.4 cm. Echogenicity within normal limits. No mass or hydronephrosis visualized. Abdominal aorta: No aneurysm visualized. Other findings: None. IMPRESSION: 1. Increased hepatic parenchymal echogenicity suggestive of steatosis. 2. Splenomegaly. 3. Sludge in the gallbladder lumen. No secondary signs for acute cholecystitis. 4. Persistent prominent common bile duct measuring 7.8 mm. Electronically Signed   By: Annia Belt M.D.   On: 10/25/2023 09:17     IR IMAGING GUIDED PORT INSERTION  Result Date: 09/14/2022 INDICATION: Poor IV access.  Myelodysplastic syndrome EXAM: IMPLANTED PORT A CATH PLACEMENT WITH ULTRASOUND AND FLUOROSCOPIC GUIDANCE MEDICATIONS: None ANESTHESIA/SEDATION: Moderate (conscious) sedation was employed during this procedure. A total of Versed 3 mg and Fentanyl 100 mcg was administered intravenously. Moderate Sedation Time: 20 minutes. The patient's level of consciousness and vital signs were monitored continuously by radiology nursing throughout the procedure under my direct supervision. FLUOROSCOPY TIME:  Fluoroscopic dose; 1 mGy COMPLICATIONS: None immediate. PROCEDURE: The procedure, risks, benefits, and alternatives were explained to the patient. Questions regarding the procedure were encouraged and answered. The patient understands and consents to the procedure. The RIGHT neck and chest  were prepped with chlorhexidine in a sterile fashion, and a sterile drape was applied covering the operative field. Maximum barrier sterile technique with sterile gowns and gloves were used for the procedure. A timeout was performed prior to the initiation of the procedure. Local anesthesia was provided with 1% lidocaine with epinephrine. After creating a small venotomy incision, a micropuncture kit was utilized to access the internal jugular vein under direct, real-time ultrasound guidance. Ultrasound image documentation was performed. The microwire was kinked to measure appropriate catheter length. A subcutaneous port pocket was then created along the upper chest wall utilizing a combination of sharp and blunt dissection. The pocket was irrigated with sterile saline. A single lumen ISP power injectable port was chosen for placement. The 8 Fr catheter was tunneled from the port pocket site to the venotomy incision. The port was placed in the pocket. The external catheter was trimmed to appropriate length. At the venotomy, an 8 Fr peel-away sheath was placed over a guidewire under fluoroscopic guidance. The catheter was then placed through the sheath and the sheath was removed. Final catheter positioning was confirmed and documented with a fluoroscopic spot radiograph. The port was accessed with a Huber needle, aspirated and flushed with heparinized saline. The port pocket incision was closed with interrupted 3-0 Vicryl suture then Dermabond was applied, including at the venotomy incision. Dressings were placed. The patient tolerated the procedure well without immediate post procedural complication. IMPRESSION: Successful placement of a RIGHT internal jugular approach power injectable Port-A-Cath. The tip of the catheter is positioned within the proximal RIGHT atrium. The catheter is  ready for immediate use. Roanna Banning, MD [facility] Radiology Electronically Signed    By: Roanna Banning M.D.   On: 09/14/2022 17:18   CT ANGIO GI BLEED  Result Date: 08/31/2022 CLINICAL DATA:  Acute mesenteric ischemia. Abdominal pain. History of myelodysplastic syndrome. EXAM: CTA ABDOMEN AND PELVIS WITHOUT AND WITH CONTRAST TECHNIQUE: Multidetector CT imaging of the abdomen and pelvis was performed using the standard protocol during bolus administration of intravenous contrast. Multiplanar reconstructed images and MIPs were obtained and reviewed to evaluate the vascular anatomy. RADIATION DOSE REDUCTION: This exam was performed according to the departmental dose-optimization program which includes automated exposure control, adjustment of the mA and/or kV according to patient size and/or use of iterative reconstruction technique. CONTRAST:  OMNIPAQUE IOHEXOL 350 MG/ML SOLN COMPARISON:  Abdominal ultrasound examination 04/03/2021 FINDINGS: VASCULAR Aorta: Normal caliber abdominal aorta. Scattered atherosclerotic calcifications. No dissection. Celiac: Normal SMA: Normal Renals: Normal IMA: Patent Inflow: Normal Proximal Outflow: Normal Veins: Normal Review of the MIP images confirms the above findings. NON-VASCULAR Lower chest: Small left pleural effusion and bibasilar atelectasis. The heart is normal in size. No pericardial effusion. There is a moderate to large hiatal hernia. Hepatobiliary: No hepatic lesions or intrahepatic biliary dilatation. The gallbladder is unremarkable. No common bile duct dilatation. Pancreas: No mass, inflammation or ductal dilatation. Spleen: Massive splenomegaly. The spleen measures 20 x 16 x 11 cm. No splenic lesions or splenic infarct. Adrenals/Urinary Tract: The left kidney is displaced medially and inferiorly by the enlarged spleen. No worrisome renal lesions,, hydronephrosis or pyelonephritis. There is a lower pole right renal calculus and there are 2 distal left ureteral calculi more proximal calculus measures 4 mm and the more distal calculus measures 5  mm. I do not see any hydroureter or hydronephrosis. Stomach/Bowel: The stomach, duodenum, small bowel and colon are grossly normal. No inflammatory changes, mass lesions or obstructive findings. Lymphatic: No abdominal or pelvic lymphadenopathy. Reproductive: The prostate gland is enlarged. The seminal vesicles are unremarkable. Other: No pelvic mass or adenopathy. No free pelvic fluid collections. No inguinal mass or adenopathy. No abdominal wall hernia or subcutaneous lesions. Musculoskeletal: No significant bony findings. IMPRESSION: 1. Normal caliber abdominal aorta and no dissection. The branch vessels are normal. 2. Splenomegaly. 3. Two distal left ureteral calculi but no hydroureter or hydronephrosis. 4. Lower pole right renal calculus. 5. Moderate to large hiatal hernia. 6. Small left pleural effusion and bibasilar atelectasis. Electronically Signed   By: Rudie Meyer M.D.   On: 08/31/2022 18:13     ASSESSMENT & PLAN:   78 y.o. male with   #1 JAK2-positive myeloproliferative neoplasm-primarily presenting with thrombocytosis and associated MDS causing refractory anemia -No polycythemia.  -Mild leukocytosis.  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 Has grade 1 out of 3 reticulin fibrosis.  Uncertain if this is primary or secondary. -LDH has remained within normal limits. 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 -CT Angio GI bleed scan from 09/01/2022 showed kidney stones.and significant splenomegaly  PLAN:  -Discussed lab results on 11/11/23 in detail with patient. CBC showed WBC of 6.0K, hemoglobin of 7.1, and platelets of 186K. -platelets improved -WBC normal -hgb low at 7.1 -discussed option of receiving a blood transfusion -patient was last transfused on 11/03/2023, and also 2 weeks prior to that time -Discussed findings of 10/25/2023 Abdominal US which showed findings of sludge in the gallbladder, with no concern for inflammation; some  element of changes in the liver from fatty liver; and  borderline enlargement of the bile duct. His spleen continues to be enlarged, though it has improved with more than 30% reduction in size. -discussed that there may be a role for medication if there is concern for gallstones -Patient has tolerated Vonjo 200 mg po BID with no major toxicity issues -continue full-dose of Vonjo, 200 mg po BID  -continue valtrex for shingles prophylaxis -will monitor his upcoming iron labs -discussed that there may be a role for low dose iron chelating agents, such as deferoxamine, or other options, dependent on which would be best to consider given his current medications -educated patient that iron-chelating agents can cause cytopenia, and there therefore needs to be a balance -there may be a role for retacrit or aranesp injections every 2 weeks down the line for stimulation of RBC production -will check iron and testosterone levels with blood work -discussed that there may be a role for testosterone replacement if needed -there may be a role to consider low dose steroids if needed, most likely after first considering low dose testosterone -educated patient that JAK2 inhibitior typically helps with fatigue related to abnormal cytokine release -Discussed option of using anti-nausea medication, which patient is not inclined to try -educated patient that there is an epidemic of Noro virus at this time -recommend patient to stay well-hydrated -answered all of patient's and his wife's questions in detail -continue to follow-up with Dr Lowella Bandy @ [facility]  Follow-up: Weekly labs and PRBC transfusion x 1 appointments  MD visit in 4 weeks  The total time spent in the appointment was 30 minutes* .  All of the patient's questions were answered with apparent satisfaction. The patient knows to call the clinic with any problems, questions or concerns.   Wyvonnia Lora MD MS AAHIVMS [facility] Hematology/Oncology  Physician [facility]  .*Total Encounter Time as defined by the Centers for Medicare and Medicaid Services includes, in addition to the face-to-face time of a patient visit (documented in the note above) non-face-to-face time: obtaining and reviewing outside history, ordering and reviewing medications, tests or procedures, care coordination (communications with other health care professionals or caregivers) and documentation in the medical record.    I,Mitra Faeizi,acting as a Neurosurgeon for Wyvonnia Lora, MD.,have documented all relevant documentation on the behalf of Wyvonnia Lora, MD,as directed by  Wyvonnia Lora, MD while in the presence of Wyvonnia Lora, MD.  .I have reviewed the above documentation for accuracy and completeness, and I agree with the above. Johney Maine MD

## 2023-11-14 ENCOUNTER — Ambulatory Visit (HOSPITAL_BASED_OUTPATIENT_CLINIC_OR_DEPARTMENT_OTHER)
Admission: RE | Admit: 2023-11-14 | Discharge: 2023-11-14 | Disposition: A | Discharge status: Home / Self Care | Payer: Medicare Other | Source: Ambulatory Visit | Attending: Cardiology | Admitting: Cardiology

## 2023-11-14 ENCOUNTER — Telehealth: Payer: Self-pay | Admitting: Hematology

## 2023-11-14 DIAGNOSIS — R0609 Other forms of dyspnea: Secondary | ICD-10-CM | POA: Insufficient documentation

## 2023-11-14 LAB — BPAM RBC
Blood Product Expiration Date: 202502162359
ISSUE DATE / TIME: 202501241300
Unit Type and Rh: 6200

## 2023-11-14 LAB — TYPE AND SCREEN
ABO/RH(D): A POS
Antibody Screen: NEGATIVE
Unit division: 0

## 2023-11-14 NOTE — Telephone Encounter (Signed)
Spoke with patient confirming upcoming appointments

## 2023-11-15 ENCOUNTER — Other Ambulatory Visit: Payer: Self-pay

## 2023-11-15 DIAGNOSIS — D469 Myelodysplastic syndrome, unspecified: Secondary | ICD-10-CM

## 2023-11-15 LAB — ECHOCARDIOGRAM COMPLETE
AR max vel: 2.62 cm2
AV Area VTI: 2.86 cm2
AV Area mean vel: 2.93 cm2
AV Mean grad: 6 mm[Hg]
AV Peak grad: 14.1 mm[Hg]
AV Vena cont: 0.3 cm
Ao pk vel: 1.88 m/s
Area-P 1/2: 2.82 cm2
Calc EF: 66.1 %
MV M vel: 4.57 m/s
MV Peak grad: 83.4 mm[Hg]
MV Vena cont: 0.15 cm
P 1/2 time: 1285 ms
Radius: 0.3 cm
S' Lateral: 3.5 cm
Single Plane A2C EF: 66.5 %
Single Plane A4C EF: 66.2 %

## 2023-11-16 ENCOUNTER — Other Ambulatory Visit: Payer: Self-pay

## 2023-11-16 ENCOUNTER — Inpatient Hospital Stay: Payer: Medicare Other

## 2023-11-16 ENCOUNTER — Telehealth: Payer: Self-pay

## 2023-11-16 DIAGNOSIS — D469 Myelodysplastic syndrome, unspecified: Secondary | ICD-10-CM

## 2023-11-16 DIAGNOSIS — D464 Refractory anemia, unspecified: Secondary | ICD-10-CM | POA: Diagnosis not present

## 2023-11-16 DIAGNOSIS — Z79899 Other long term (current) drug therapy: Secondary | ICD-10-CM | POA: Diagnosis not present

## 2023-11-16 LAB — PREPARE RBC (CROSSMATCH)

## 2023-11-16 LAB — CBC WITH DIFFERENTIAL (CANCER CENTER ONLY)
Abs Immature Granulocytes: 0.13 10*3/uL — ABNORMAL HIGH (ref 0.00–0.07)
Basophils Absolute: 0.2 10*3/uL — ABNORMAL HIGH (ref 0.0–0.1)
Basophils Relative: 4 %
Eosinophils Absolute: 0.5 10*3/uL (ref 0.0–0.5)
Eosinophils Relative: 9 %
HCT: 21.8 % — ABNORMAL LOW (ref 39.0–52.0)
Hemoglobin: 7.2 g/dL — ABNORMAL LOW (ref 13.0–17.0)
Immature Granulocytes: 2 %
Lymphocytes Relative: 11 %
Lymphs Abs: 0.6 10*3/uL — ABNORMAL LOW (ref 0.7–4.0)
MCH: 30.5 pg (ref 26.0–34.0)
MCHC: 33 g/dL (ref 30.0–36.0)
MCV: 92.4 fL (ref 80.0–100.0)
Monocytes Absolute: 0.6 10*3/uL (ref 0.1–1.0)
Monocytes Relative: 10 %
Neutro Abs: 3.5 10*3/uL (ref 1.7–7.7)
Neutrophils Relative %: 64 %
Platelet Count: 168 10*3/uL (ref 150–400)
RBC: 2.36 MIL/uL — ABNORMAL LOW (ref 4.22–5.81)
RDW: 15.1 % (ref 11.5–15.5)
WBC Count: 5.5 10*3/uL (ref 4.0–10.5)
nRBC: 0 % (ref 0.0–0.2)

## 2023-11-16 MED ORDER — SODIUM CHLORIDE 0.9% FLUSH
10.0000 mL | Freq: Once | INTRAVENOUS | Status: AC
  Administered 2023-11-16: 10 mL

## 2023-11-16 MED ORDER — HEPARIN SOD (PORK) LOCK FLUSH 100 UNIT/ML IV SOLN
500.0000 [IU] | Freq: Once | INTRAVENOUS | Status: AC
  Administered 2023-11-16: 500 [IU]

## 2023-11-16 NOTE — Telephone Encounter (Signed)
Blood orders placed for 11/17/23. BB aware

## 2023-11-17 ENCOUNTER — Inpatient Hospital Stay: Payer: Medicare Other

## 2023-11-17 ENCOUNTER — Encounter: Payer: Self-pay | Admitting: Hematology

## 2023-11-17 VITALS — BP 110/66 | HR 64 | Temp 98.0°F | Resp 17

## 2023-11-17 DIAGNOSIS — D469 Myelodysplastic syndrome, unspecified: Secondary | ICD-10-CM

## 2023-11-17 DIAGNOSIS — D464 Refractory anemia, unspecified: Secondary | ICD-10-CM | POA: Diagnosis not present

## 2023-11-17 DIAGNOSIS — Z79899 Other long term (current) drug therapy: Secondary | ICD-10-CM | POA: Diagnosis not present

## 2023-11-17 MED ORDER — METHYLPREDNISOLONE SODIUM SUCC 40 MG IJ SOLR
40.0000 mg | Freq: Once | INTRAMUSCULAR | Status: AC
  Administered 2023-11-17: 40 mg via INTRAVENOUS
  Filled 2023-11-17: qty 1

## 2023-11-17 MED ORDER — SODIUM CHLORIDE 0.9% FLUSH: 3.0000 mL | INTRAVENOUS | Status: DC | PRN

## 2023-11-17 MED ORDER — SODIUM CHLORIDE 0.9% IV SOLUTION
250.0000 mL | INTRAVENOUS | Status: DC
  Administered 2023-11-17: 100 mL via INTRAVENOUS

## 2023-11-17 MED ORDER — HEPARIN SOD (PORK) LOCK FLUSH 100 UNIT/ML IV SOLN
500.0000 [IU] | Freq: Once | INTRAVENOUS | Status: AC
  Administered 2023-11-17: 500 [IU]

## 2023-11-17 MED ORDER — ACETAMINOPHEN 325 MG PO TABS
650.0000 mg | ORAL_TABLET | Freq: Once | ORAL | Status: AC
  Administered 2023-11-17: 650 mg via ORAL
  Filled 2023-11-17: qty 2

## 2023-11-17 NOTE — Patient Instructions (Signed)
Blood Transfusion, Adult A blood transfusion is a procedure in which you receive blood or a type of blood cell (blood component) through an IV. You may need a blood transfusion when you have a low blood count, which is a low number of any blood cell. This may result from a bleeding disorder, illness, injury, or surgery. The blood may come from a donor, or you may be able to have your own blood collected and stored (autologous blood donation) before a planned surgery. The blood given in a transfusion may be made up of different blood components. You may receive: Red blood cells. These carry oxygen to the cells in the body. Platelets. These help your blood to clot. Plasma. This is the liquid part of your blood. It carries proteins and other substances throughout the body. White blood cells. These help you fight infections. If you have hemophilia or another clotting disorder, you may also receive other types of blood products. Depending on the type of blood product, this procedure may take 1-4 hours to complete. Tell a health care provider about: Any bleeding problems you have. Any previous reactions you have had during a blood transfusion. Any allergies you have. All medicines you are taking, including vitamins, herbs, eye drops, creams, and over-the-counter medicines. Any surgeries you have had. Any medical conditions you have. Whether you are pregnant or may be pregnant. What are the risks? Talk with your health care provider about risks. The most common problems include: A mild allergic reaction, such as red, swollen areas of skin (hives) and itching. Fever or chills. This may be the body's response to new blood cells received. This may occur during or up to 4 hours after the transfusion. More serious problems may include: A serious allergic reaction that causes difficulty breathing or swelling around the face and lips. Transfusion-associated circulatory overload (TACO), or too much fluid in  the lungs. This may cause breathing problems. Transfusion-related acute lung injury (TRALI), which causes breathing difficulty and low oxygen in the blood. This can occur within hours of the transfusion or several days later. Iron overload. This can happen after receiving many blood transfusions over a period of time. Infection or virus being transmitted. This is rare because donated blood is carefully tested before it is given. Hemolytic transfusion reaction. This is rare. It happens when the body's defense system (immune system)tries to attack the new blood cells. Symptoms may include fever, chills, nausea, low blood pressure, and low back or chest pain. Transfusion-associated graft-versus-host disease (TAGVHD). This is rare. It happens when donated cells attack the body's healthy tissues. What happens before the procedure? You will have a blood test to check your blood type. This test is done to know what kind of blood your body will accept and to match it to the donor blood. If you are going to have a planned surgery, you may be able to do an autologous blood donation. This may be done in case you need to have a transfusion. You will have your temperature, blood pressure, and pulse checked before the transfusion. If you have had an allergic reaction to a transfusion in the past, you may be given medicine to help prevent a reaction. This medicine may be given to you by mouth (orally) or through an IV. What happens during the procedure?  An IV will be inserted into one of your veins. The bag of blood will be attached to your IV. The blood will then enter through your vein. Your temperature, blood pressure,  and pulse will be monitored during the transfusion. This monitoring is done to detect early signs of a transfusion reaction. Tell your nurse right away if you have any of these symptoms during the transfusion: Shortness of breath or trouble breathing. Chest or back pain. Fever or  chills. Itching or hives. If you have any signs or symptoms of a reaction, your transfusion will be stopped and you may be given medicine. When the transfusion is complete, your IV will be removed. Pressure may be applied to the IV site for a few minutes. A bandage (dressing)will be applied. The procedure may vary among health care providers and hospitals. What happens after the procedure? Your temperature, blood pressure, pulse, breathing rate, and blood oxygen level will be monitored until you leave the hospital or clinic. Your blood may be tested to see how you have responded to the transfusion. You may be warmed with fluids or blankets to maintain a normal body temperature. If you receive your blood transfusion in an outpatient setting, you will be told whom to contact to report any reactions. Where to find more information Visit the [employer]: redcross.org Summary A blood transfusion is a procedure in which you receive blood or a type of blood cell (blood component) through an IV. The blood given in a transfusion may be made up of different blood components. You may receive red blood cells, platelets, plasma, or white blood cells depending on the condition treated. Your temperature, blood pressure, and pulse will be monitored before, during, and after the transfusion. After the transfusion, your blood may be tested to see how your body has responded. This information is not intended to replace advice given to you by your health care provider. Make sure you discuss any questions you have with your health care provider. Document Revised: 01/01/2022 Document Reviewed: 01/01/2022 Elsevier Patient Education  2024 [employer].

## 2023-11-18 LAB — TYPE AND SCREEN
ABO/RH(D): A POS
Antibody Screen: NEGATIVE
Unit division: 0

## 2023-11-18 LAB — BPAM RBC
Blood Product Expiration Date: 202502172359
ISSUE DATE / TIME: 202501300836
Unit Type and Rh: 6200

## 2023-11-23 ENCOUNTER — Other Ambulatory Visit: Payer: Self-pay

## 2023-11-23 DIAGNOSIS — D469 Myelodysplastic syndrome, unspecified: Secondary | ICD-10-CM

## 2023-11-24 ENCOUNTER — Inpatient Hospital Stay: Payer: Medicare Other | Attending: Hematology

## 2023-11-24 ENCOUNTER — Inpatient Hospital Stay: Payer: Medicare Other

## 2023-11-24 VITALS — BP 109/58 | HR 61 | Temp 97.7°F | Resp 16

## 2023-11-24 DIAGNOSIS — N202 Calculus of kidney with calculus of ureter: Secondary | ICD-10-CM | POA: Diagnosis not present

## 2023-11-24 DIAGNOSIS — K219 Gastro-esophageal reflux disease without esophagitis: Secondary | ICD-10-CM | POA: Diagnosis not present

## 2023-11-24 DIAGNOSIS — I7090 Unspecified atherosclerosis: Secondary | ICD-10-CM | POA: Insufficient documentation

## 2023-11-24 DIAGNOSIS — Z87891 Personal history of nicotine dependence: Secondary | ICD-10-CM | POA: Diagnosis not present

## 2023-11-24 DIAGNOSIS — K449 Diaphragmatic hernia without obstruction or gangrene: Secondary | ICD-10-CM | POA: Diagnosis not present

## 2023-11-24 DIAGNOSIS — D72829 Elevated white blood cell count, unspecified: Secondary | ICD-10-CM | POA: Diagnosis not present

## 2023-11-24 DIAGNOSIS — D75839 Thrombocytosis, unspecified: Secondary | ICD-10-CM | POA: Diagnosis not present

## 2023-11-24 DIAGNOSIS — Z79624 Long term (current) use of inhibitors of nucleotide synthesis: Secondary | ICD-10-CM | POA: Insufficient documentation

## 2023-11-24 DIAGNOSIS — J90 Pleural effusion, not elsewhere classified: Secondary | ICD-10-CM | POA: Insufficient documentation

## 2023-11-24 DIAGNOSIS — D469 Myelodysplastic syndrome, unspecified: Secondary | ICD-10-CM

## 2023-11-24 DIAGNOSIS — D464 Refractory anemia, unspecified: Secondary | ICD-10-CM | POA: Diagnosis not present

## 2023-11-24 DIAGNOSIS — M1711 Unilateral primary osteoarthritis, right knee: Secondary | ICD-10-CM | POA: Diagnosis not present

## 2023-11-24 DIAGNOSIS — Z8719 Personal history of other diseases of the digestive system: Secondary | ICD-10-CM | POA: Insufficient documentation

## 2023-11-24 DIAGNOSIS — Z79899 Other long term (current) drug therapy: Secondary | ICD-10-CM | POA: Diagnosis not present

## 2023-11-24 DIAGNOSIS — D649 Anemia, unspecified: Secondary | ICD-10-CM

## 2023-11-24 DIAGNOSIS — E782 Mixed hyperlipidemia: Secondary | ICD-10-CM | POA: Diagnosis not present

## 2023-11-24 LAB — CBC WITH DIFFERENTIAL (CANCER CENTER ONLY)
Abs Immature Granulocytes: 0.17 10*3/uL — ABNORMAL HIGH (ref 0.00–0.07)
Basophils Absolute: 0.2 10*3/uL — ABNORMAL HIGH (ref 0.0–0.1)
Basophils Relative: 4 %
Eosinophils Absolute: 0.5 10*3/uL (ref 0.0–0.5)
Eosinophils Relative: 8 %
HCT: 22.6 % — ABNORMAL LOW (ref 39.0–52.0)
Hemoglobin: 7.5 g/dL — ABNORMAL LOW (ref 13.0–17.0)
Immature Granulocytes: 3 %
Lymphocytes Relative: 10 %
Lymphs Abs: 0.6 10*3/uL — ABNORMAL LOW (ref 0.7–4.0)
MCH: 30.5 pg (ref 26.0–34.0)
MCHC: 33.2 g/dL (ref 30.0–36.0)
MCV: 91.9 fL (ref 80.0–100.0)
Monocytes Absolute: 0.6 10*3/uL (ref 0.1–1.0)
Monocytes Relative: 9 %
Neutro Abs: 4.1 10*3/uL (ref 1.7–7.7)
Neutrophils Relative %: 66 %
Platelet Count: 173 10*3/uL (ref 150–400)
RBC: 2.46 MIL/uL — ABNORMAL LOW (ref 4.22–5.81)
RDW: 15.9 % — ABNORMAL HIGH (ref 11.5–15.5)
WBC Count: 6.2 10*3/uL (ref 4.0–10.5)
nRBC: 0 % (ref 0.0–0.2)

## 2023-11-24 LAB — IRON AND IRON BINDING CAPACITY (CC-WL,HP ONLY)
Iron: 176 ug/dL (ref 45–182)
Saturation Ratios: 95 % — ABNORMAL HIGH (ref 17.9–39.5)
TIBC: 185 ug/dL — ABNORMAL LOW (ref 250–450)
UIBC: 9 ug/dL — ABNORMAL LOW (ref 117–376)

## 2023-11-24 LAB — PREPARE RBC (CROSSMATCH)

## 2023-11-24 LAB — SAMPLE TO BLOOD BANK

## 2023-11-24 LAB — FERRITIN: Ferritin: 3824 ng/mL — ABNORMAL HIGH (ref 24–336)

## 2023-11-24 MED ORDER — SODIUM CHLORIDE 0.9% FLUSH
10.0000 mL | Freq: Once | INTRAVENOUS | Status: AC
  Administered 2023-11-24: 10 mL

## 2023-11-24 MED ORDER — SODIUM CHLORIDE 0.9% IV SOLUTION
250.0000 mL | INTRAVENOUS | Status: DC
  Administered 2023-11-24: 100 mL via INTRAVENOUS

## 2023-11-24 MED ORDER — HEPARIN SOD (PORK) LOCK FLUSH 100 UNIT/ML IV SOLN
500.0000 [IU] | Freq: Once | INTRAVENOUS | Status: AC
  Administered 2023-11-24: 500 [IU]

## 2023-11-24 MED ORDER — ACETAMINOPHEN 325 MG PO TABS
650.0000 mg | ORAL_TABLET | Freq: Once | ORAL | Status: AC
  Administered 2023-11-24: 650 mg via ORAL
  Filled 2023-11-24: qty 2

## 2023-11-24 MED ORDER — METHYLPREDNISOLONE SODIUM SUCC 40 MG IJ SOLR
40.0000 mg | Freq: Once | INTRAMUSCULAR | Status: AC
  Administered 2023-11-24: 40 mg via INTRAVENOUS
  Filled 2023-11-24: qty 1

## 2023-11-24 NOTE — Patient Instructions (Signed)

## 2023-11-24 NOTE — Progress Notes (Signed)
 Patient arrived for his blood transfusion appt. His hemoglobin is 7.5. Order to transfuse for hemoglobin less than 7.5, but patient is symptomatic. He's experiencing fatigue, some SOB and exercise intolerance. Ok to give one unit of blood today per Dr. Onesimo.

## 2023-11-25 LAB — BPAM RBC
Blood Product Expiration Date: 202503022359
ISSUE DATE / TIME: 202502060934
Unit Type and Rh: 6200

## 2023-11-25 LAB — TYPE AND SCREEN
ABO/RH(D): A POS
Antibody Screen: NEGATIVE
Unit division: 0

## 2023-11-26 LAB — TESTOSTERONE,FREE AND TOTAL
Testosterone, Free: 3.1 pg/mL — ABNORMAL LOW (ref 6.6–18.1)
Testosterone: 746 ng/dL (ref 264–916)

## 2023-11-28 ENCOUNTER — Telehealth: Payer: Self-pay

## 2023-11-28 NOTE — Telephone Encounter (Signed)
 Pt viewed Echo results on My Chart per Dr. Vanetta Shawl note. Routed to PCP.

## 2023-11-30 ENCOUNTER — Other Ambulatory Visit: Payer: Self-pay

## 2023-11-30 DIAGNOSIS — D469 Myelodysplastic syndrome, unspecified: Secondary | ICD-10-CM

## 2023-11-30 NOTE — Progress Notes (Signed)
Specialty Pharmacy Refill Coordination Note  Fernando Lee is a 78 y.o. male contacted today regarding refills of specialty medication(s) Pacritinib Citrate [name])   Patient requested [name] at [facility] at [facility] date: 12/14/23   Medication will be filled on 12/13/23.

## 2023-12-01 ENCOUNTER — Inpatient Hospital Stay: Payer: Medicare Other

## 2023-12-01 DIAGNOSIS — J90 Pleural effusion, not elsewhere classified: Secondary | ICD-10-CM | POA: Diagnosis not present

## 2023-12-01 DIAGNOSIS — D469 Myelodysplastic syndrome, unspecified: Secondary | ICD-10-CM

## 2023-12-01 DIAGNOSIS — D464 Refractory anemia, unspecified: Secondary | ICD-10-CM | POA: Diagnosis not present

## 2023-12-01 DIAGNOSIS — Z79624 Long term (current) use of inhibitors of nucleotide synthesis: Secondary | ICD-10-CM | POA: Diagnosis not present

## 2023-12-01 DIAGNOSIS — E782 Mixed hyperlipidemia: Secondary | ICD-10-CM | POA: Diagnosis not present

## 2023-12-01 DIAGNOSIS — D72829 Elevated white blood cell count, unspecified: Secondary | ICD-10-CM | POA: Diagnosis not present

## 2023-12-01 DIAGNOSIS — Z8719 Personal history of other diseases of the digestive system: Secondary | ICD-10-CM | POA: Diagnosis not present

## 2023-12-01 DIAGNOSIS — I7090 Unspecified atherosclerosis: Secondary | ICD-10-CM | POA: Diagnosis not present

## 2023-12-01 DIAGNOSIS — M1711 Unilateral primary osteoarthritis, right knee: Secondary | ICD-10-CM | POA: Diagnosis not present

## 2023-12-01 DIAGNOSIS — Z87891 Personal history of nicotine dependence: Secondary | ICD-10-CM | POA: Diagnosis not present

## 2023-12-01 DIAGNOSIS — K219 Gastro-esophageal reflux disease without esophagitis: Secondary | ICD-10-CM | POA: Diagnosis not present

## 2023-12-01 DIAGNOSIS — Z79899 Other long term (current) drug therapy: Secondary | ICD-10-CM | POA: Diagnosis not present

## 2023-12-01 DIAGNOSIS — N202 Calculus of kidney with calculus of ureter: Secondary | ICD-10-CM | POA: Diagnosis not present

## 2023-12-01 DIAGNOSIS — K449 Diaphragmatic hernia without obstruction or gangrene: Secondary | ICD-10-CM | POA: Diagnosis not present

## 2023-12-01 DIAGNOSIS — D75839 Thrombocytosis, unspecified: Secondary | ICD-10-CM | POA: Diagnosis not present

## 2023-12-01 LAB — CBC WITH DIFFERENTIAL (CANCER CENTER ONLY)
Abs Immature Granulocytes: 0.19 10*3/uL — ABNORMAL HIGH (ref 0.00–0.07)
Basophils Absolute: 0.3 10*3/uL — ABNORMAL HIGH (ref 0.0–0.1)
Basophils Relative: 4 %
Eosinophils Absolute: 0.4 10*3/uL (ref 0.0–0.5)
Eosinophils Relative: 7 %
HCT: 24.9 % — ABNORMAL LOW (ref 39.0–52.0)
Hemoglobin: 8.2 g/dL — ABNORMAL LOW (ref 13.0–17.0)
Immature Granulocytes: 3 %
Lymphocytes Relative: 9 %
Lymphs Abs: 0.6 10*3/uL — ABNORMAL LOW (ref 0.7–4.0)
MCH: 30.9 pg (ref 26.0–34.0)
MCHC: 32.9 g/dL (ref 30.0–36.0)
MCV: 94 fL (ref 80.0–100.0)
Monocytes Absolute: 0.5 10*3/uL (ref 0.1–1.0)
Monocytes Relative: 8 %
Neutro Abs: 4.6 10*3/uL (ref 1.7–7.7)
Neutrophils Relative %: 69 %
Platelet Count: 181 10*3/uL (ref 150–400)
RBC: 2.65 MIL/uL — ABNORMAL LOW (ref 4.22–5.81)
RDW: 17.3 % — ABNORMAL HIGH (ref 11.5–15.5)
WBC Count: 6.6 10*3/uL (ref 4.0–10.5)
nRBC: 0 % (ref 0.0–0.2)

## 2023-12-01 LAB — TYPE AND SCREEN
ABO/RH(D): A POS
Antibody Screen: NEGATIVE

## 2023-12-01 LAB — SAMPLE TO BLOOD BANK

## 2023-12-01 MED ORDER — SODIUM CHLORIDE 0.9% FLUSH
10.0000 mL | Freq: Once | INTRAVENOUS | Status: AC
  Administered 2023-12-01: 10 mL

## 2023-12-01 MED ORDER — HEPARIN SOD (PORK) LOCK FLUSH 100 UNIT/ML IV SOLN
500.0000 [IU] | Freq: Once | INTRAVENOUS | Status: AC
Start: 2023-12-01 — End: 2023-12-01
  Administered 2023-12-01: 500 [IU]

## 2023-12-01 MED ORDER — SODIUM CHLORIDE 0.9% FLUSH
10.0000 mL | Freq: Once | INTRAVENOUS | Status: AC
Start: 2023-12-01 — End: 2023-12-01
  Administered 2023-12-01: 10 mL

## 2023-12-01 NOTE — Progress Notes (Signed)
Patient arrived today for a potential blood transfusion. Hemoglobin today is 8.2. The parameters are to transfuse for hemoglobin less than 7.5, so patient de-accessed. Patient in agreement.

## 2023-12-06 ENCOUNTER — Other Ambulatory Visit: Payer: Self-pay | Admitting: Hematology

## 2023-12-07 ENCOUNTER — Telehealth: Payer: Self-pay | Admitting: Hematology

## 2023-12-07 ENCOUNTER — Other Ambulatory Visit: Payer: Self-pay

## 2023-12-07 ENCOUNTER — Encounter: Payer: Self-pay | Admitting: Hematology

## 2023-12-07 DIAGNOSIS — D469 Myelodysplastic syndrome, unspecified: Secondary | ICD-10-CM

## 2023-12-07 NOTE — Telephone Encounter (Signed)
 Spoke with patient confirming upcoming appointment change

## 2023-12-08 ENCOUNTER — Inpatient Hospital Stay: Payer: Medicare Other

## 2023-12-08 ENCOUNTER — Other Ambulatory Visit: Payer: Self-pay

## 2023-12-08 DIAGNOSIS — E782 Mixed hyperlipidemia: Secondary | ICD-10-CM | POA: Diagnosis not present

## 2023-12-08 DIAGNOSIS — Z79899 Other long term (current) drug therapy: Secondary | ICD-10-CM | POA: Diagnosis not present

## 2023-12-08 DIAGNOSIS — D72829 Elevated white blood cell count, unspecified: Secondary | ICD-10-CM | POA: Diagnosis not present

## 2023-12-08 DIAGNOSIS — I7090 Unspecified atherosclerosis: Secondary | ICD-10-CM | POA: Diagnosis not present

## 2023-12-08 DIAGNOSIS — Z79624 Long term (current) use of inhibitors of nucleotide synthesis: Secondary | ICD-10-CM | POA: Diagnosis not present

## 2023-12-08 DIAGNOSIS — D469 Myelodysplastic syndrome, unspecified: Secondary | ICD-10-CM

## 2023-12-08 DIAGNOSIS — N202 Calculus of kidney with calculus of ureter: Secondary | ICD-10-CM | POA: Diagnosis not present

## 2023-12-08 DIAGNOSIS — D75839 Thrombocytosis, unspecified: Secondary | ICD-10-CM | POA: Diagnosis not present

## 2023-12-08 DIAGNOSIS — J90 Pleural effusion, not elsewhere classified: Secondary | ICD-10-CM | POA: Diagnosis not present

## 2023-12-08 DIAGNOSIS — K219 Gastro-esophageal reflux disease without esophagitis: Secondary | ICD-10-CM | POA: Diagnosis not present

## 2023-12-08 DIAGNOSIS — K449 Diaphragmatic hernia without obstruction or gangrene: Secondary | ICD-10-CM | POA: Diagnosis not present

## 2023-12-08 DIAGNOSIS — D464 Refractory anemia, unspecified: Secondary | ICD-10-CM | POA: Diagnosis not present

## 2023-12-08 DIAGNOSIS — M1711 Unilateral primary osteoarthritis, right knee: Secondary | ICD-10-CM | POA: Diagnosis not present

## 2023-12-08 DIAGNOSIS — Z87891 Personal history of nicotine dependence: Secondary | ICD-10-CM | POA: Diagnosis not present

## 2023-12-08 DIAGNOSIS — Z8719 Personal history of other diseases of the digestive system: Secondary | ICD-10-CM | POA: Diagnosis not present

## 2023-12-08 LAB — CBC WITH DIFFERENTIAL (CANCER CENTER ONLY)
Abs Immature Granulocytes: 0.15 10*3/uL — ABNORMAL HIGH (ref 0.00–0.07)
Basophils Absolute: 0.2 10*3/uL — ABNORMAL HIGH (ref 0.0–0.1)
Basophils Relative: 3 %
Eosinophils Absolute: 0.5 10*3/uL (ref 0.0–0.5)
Eosinophils Relative: 7 %
HCT: 23.1 % — ABNORMAL LOW (ref 39.0–52.0)
Hemoglobin: 7.5 g/dL — ABNORMAL LOW (ref 13.0–17.0)
Immature Granulocytes: 2 %
Lymphocytes Relative: 9 %
Lymphs Abs: 0.6 10*3/uL — ABNORMAL LOW (ref 0.7–4.0)
MCH: 31.3 pg (ref 26.0–34.0)
MCHC: 32.5 g/dL (ref 30.0–36.0)
MCV: 96.3 fL (ref 80.0–100.0)
Monocytes Absolute: 0.7 10*3/uL (ref 0.1–1.0)
Monocytes Relative: 10 %
Neutro Abs: 4.7 10*3/uL (ref 1.7–7.7)
Neutrophils Relative %: 69 %
Platelet Count: 192 10*3/uL (ref 150–400)
RBC: 2.4 MIL/uL — ABNORMAL LOW (ref 4.22–5.81)
RDW: 19.7 % — ABNORMAL HIGH (ref 11.5–15.5)
WBC Count: 6.8 10*3/uL (ref 4.0–10.5)
nRBC: 0 % (ref 0.0–0.2)

## 2023-12-08 LAB — PREPARE RBC (CROSSMATCH)

## 2023-12-08 LAB — SAMPLE TO BLOOD BANK

## 2023-12-08 MED ORDER — METHYLPREDNISOLONE SODIUM SUCC 40 MG IJ SOLR
40.0000 mg | Freq: Once | INTRAMUSCULAR | Status: AC
  Administered 2023-12-08: 40 mg via INTRAVENOUS
  Filled 2023-12-08: qty 1

## 2023-12-08 MED ORDER — SODIUM CHLORIDE 0.9% IV SOLUTION
250.0000 mL | INTRAVENOUS | Status: DC
  Administered 2023-12-08: 250 mL via INTRAVENOUS

## 2023-12-08 MED ORDER — ACETAMINOPHEN 325 MG PO TABS
650.0000 mg | ORAL_TABLET | Freq: Once | ORAL | Status: AC
  Administered 2023-12-08: 650 mg via ORAL
  Filled 2023-12-08: qty 2

## 2023-12-08 MED ORDER — SODIUM CHLORIDE 0.9% FLUSH
10.0000 mL | Freq: Once | INTRAVENOUS | Status: AC
  Administered 2023-12-08: 10 mL

## 2023-12-08 NOTE — Patient Instructions (Signed)

## 2023-12-09 LAB — BPAM RBC
Blood Product Expiration Date: 202503212359
ISSUE DATE / TIME: 202502201516
Unit Type and Rh: 6200

## 2023-12-09 LAB — TYPE AND SCREEN
ABO/RH(D): A POS
Antibody Screen: NEGATIVE
Unit division: 0

## 2023-12-15 NOTE — Progress Notes (Signed)
 HEMATOLOGY/ONCOLOGY PROGRESS NOTE:   Date of Service: 12/16/2023  Patient Care Team: Jackelyn Poling, DO as PCP - General (Family Medicine)  CHIEF COMPLAINTS:  Follow-up for continued evaluation and management of JAK2 positive myeloproliferative neoplasm/MDS  INTERVAL HISTORY:  [name] is a 78 y.o. male here for continued evaluation and management of his MPN/MDS with refractory anemia and thrombocytosis.   Patient was last seen by me on 11/11/2023 and reported low energy levels and memory issues with low hgb, as well as endorsing a"sick feeling" in his stomach a few hours after blood transfusions.   Today, he is accompanied by his wife. Patient reports that he is tolerating Vonjo 200 mg po BID without any major toxicity issues.   Patient reports endorsing some GI issues with lactose intake. He notes that consuming a lactose-free diet did improve his GI symptoms.   He denies any infection issues, abdominal pain, or GI issues not related to lactose-intolerance.   His wife reports that patient's energy levels have been fluctuating and he has increasing his walking recently.   He reports that there are plans to travel sometime in March and July.   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   Internal hemorrhoids    Kidney stones    Male erectile dysfunction, unspecified    Mixed dyslipidemia    Mixed hyperlipidemia    Ocular migraine    PAC (premature atrial contraction)    Personal history of colonic polyps    Prediabetes    Residual hemorrhoidal skin tags    Spleen enlarged    nov 2023 hospitalized   Unspecified hemorrhoids     SURGICAL  HISTORY: Past Surgical History:  Procedure Laterality Date   BONE MARROW BIOPSY     IR IMAGING GUIDED PORT INSERTION  09/14/2022   KIDNEY STONE SURGERY     retrieval    SOCIAL HISTORY: Social History   Socioeconomic History   Marital status: Married    Spouse name: Not on file   Number of children: Not on file   Years of education: Not on file   Highest education level: Not on file  Occupational History   Not on file  Tobacco Use   Smoking status: Former    Types: Cigarettes   Smokeless tobacco: Never  Substance and Sexual Activity   Alcohol use: Yes    Alcohol/week: 1.0 standard drink of alcohol    Types: 1 Cans of beer per week   Drug use: Not on file   Sexual activity: Not on file  Other Topics Concern   Not on file  Social History Narrative   Not on file   Social Drivers of Health   Financial Resource Strain: Not on file  Food Insecurity: Patient Declined (09/16/2023)   Hunger Vital Sign    Worried About Running Out of Food in the Last Year: Patient declined    Ran Out of Food in the Last Year: Patient declined  Transportation Needs: Patient Declined (09/16/2023)   PRAPARE - Administrator, Civil Service (Medical): Patient declined  Lack of Transportation (Non-Medical): Patient declined  Physical Activity: Not on file  Stress: Not on file  Social Connections: Not on file  Intimate Partner Violence: Patient Declined (09/16/2023)   Humiliation, Afraid, Rape, and Kick questionnaire    Fear of Current or Ex-Partner: Patient declined    Emotionally Abused: Patient declined    Physically Abused: Patient declined    Sexually Abused: Patient declined    FAMILY HISTORY: Family History  Problem Relation Age of Onset   Stroke Brother    Congestive Heart Failure Brother     ALLERGIES:  has no known allergies.  MEDICATIONS:  Current Outpatient Medications  Medication Sig Dispense Refill   acetaminophen (TYLENOL) 325 MG tablet Take 2 tablets  (650 mg total) by mouth every 6 (six) hours as needed for mild pain (pain score 1-3) (or Fever >/= 101). 30 tablet 0   alfuzosin (UROXATRAL) 10 MG 24 hr tablet Take 10 mg by mouth daily with breakfast.     B Complex Vitamins (B COMPLEX PO) Take 1 tablet by mouth daily.     Cholecalciferol (VITAMIN D3) 50 MCG (2000 UT) TABS Take 2,000 Units by mouth daily.     docusate sodium (COLACE) 100 MG capsule Take 100 mg by mouth 2 (two) times daily.     fish oil-omega-3 fatty acids 1000 MG capsule Take 1 g by mouth daily.     furosemide (LASIX) 20 MG tablet Take 1 tablet by mouth once daily 30 tablet 0   GLUCOSAMINE CHONDROITIN MSM PO Take 1 tablet by mouth daily.     Multiple Vitamin (MULTIVITAMIN) tablet Take 1 tablet by mouth daily with breakfast.     pacritinib citrate (VONJO) 100 MG capsule Take 2 capsules (200 mg total) by mouth 2 (two) times daily. 120 capsule 2   potassium chloride (KLOR-CON) 20 MEQ packet Take 10 mEq by mouth daily.     Turmeric (QC TUMERIC COMPLEX PO) Take 750 mg by mouth daily.     valACYclovir (VALTREX) 500 MG tablet Take 1 tablet (500 mg total) by mouth 2 (two) times daily. 60 tablet 5   vitamin C (ASCORBIC ACID) 250 MG tablet Take 500 mg by mouth daily.     No current facility-administered medications for this visit.    REVIEW OF SYSTEMS:    10 Point review of Systems was done is negative except as noted above.   PHYSICAL EXAMINATION: VSS GENERAL:alert, in no acute distress and comfortable SKIN: no acute rashes, no significant lesions EYES: conjunctiva are pink and non-injected, sclera anicteric OROPHARYNX: MMM, no exudates, no oropharyngeal erythema or ulceration NECK: supple, no JVD LYMPH:  no palpable lymphadenopathy in the cervical, axillary or inguinal regions LUNGS: clear to auscultation b/l with normal respiratory effort HEART: regular rate & rhythm ABDOMEN:  normoactive bowel sounds , non tender, not distended. Extremity: no pedal edema PSYCH: alert &  oriented x 3 with fluent speech NEURO: no focal motor/sensory deficits   LABORATORY DATA:  I have reviewed the data as listed .    Latest Ref Rng & Units 12/22/2023    7:38 AM 12/16/2023    8:01 AM 12/08/2023    1:29 PM  CBC  WBC 4.0 - 10.5 K/uL 7.0  6.3  6.8   Hemoglobin 13.0 - 17.0 g/dL 8.0  7.6  7.5   Hematocrit 39.0 - 52.0 % 24.6  23.2  23.1   Platelets 150 - 400 K/uL 173  173  192      Iron/TIBC/Ferritin/ %Sat  Component Value Date/Time   IRON 176 11/24/2023 0730   TIBC 185 (L) 11/24/2023 0730   FERRITIN 3,824 (H) 11/24/2023 0730   IRONPCTSAT 95 (H) 11/24/2023 0730      Latest Ref Rng & Units 09/19/2023    3:49 AM 09/18/2023    3:30 AM 09/17/2023    3:47 AM  CMP  Glucose 70 - 99 mg/dL 161  096  045   BUN 8 - 23 mg/dL 16  19  22    Creatinine 0.61 - 1.24 mg/dL 4.09  8.11  9.14   Sodium 135 - 145 mmol/L 135  135  136   Potassium 3.5 - 5.1 mmol/L 4.1  3.8  4.1   Chloride 98 - 111 mmol/L 103  104  103   CO2 22 - 32 mmol/L 26  26  26    Calcium 8.9 - 10.3 mg/dL 8.5  8.3  8.6   Total Protein 6.5 - 8.1 g/dL   5.4   Total Bilirubin <1.2 mg/dL   1.0   Alkaline Phos 38 - 126 U/L   30   AST 15 - 41 U/L   24   ALT 0 - 44 U/L   46     Lab Results  Component Value Date   LDH 110 01/04/2022    02/23/2021 BCR ABL    02/23/2021 JAK2   Surgical Pathology  CASE: [id number]  PATIENT: Keino Margolis  Bone Marrow Report  Clinical History: MPN with progressive anemia  (BH)  DIAGNOSIS:   BONE MARROW, ASPIRATE, CLOT, CORE:  -Hypercellular bone marrow with features of myeloid neoplasm  -See comment   PERIPHERAL BLOOD:  -Macrocytic anemia  -Leukocytosis  -Thrombocytosis   COMMENT:   The bone marrow/peripheral blood show persistent involvement by  previously known myeloid neoplasm.  There is a myeloproliferative  component as supported by previous JAK2 positivity.  However, there are  also dyspoietic changes primarily involving the megakaryocytic cell line  with  numerous hypolobated/unilobated forms in addition to  dysgranulopoiesis to a lesser extent.  This is associated with  eosinophilia.  It is not entirely clear whether the overall findings  represent a myeloproliferative neoplasm with treatment related changes  or represent a primary myeloproliferative/myelodysplastic neoplasm  including but not limited to myeloid neoplasms with eosinophilia.  Correlation with cytogenetic and FISH studies strongly recommended.      RADIOGRAPHIC STUDIES: I have personally reviewed the radiological images as listed and agreed with the findings in the report. No results found.    IR IMAGING GUIDED PORT INSERTION  Result Date: 09/14/2022 INDICATION: Poor IV access.  Myelodysplastic syndrome EXAM: IMPLANTED PORT A CATH PLACEMENT WITH ULTRASOUND AND FLUOROSCOPIC GUIDANCE MEDICATIONS: None ANESTHESIA/SEDATION: Moderate (conscious) sedation was employed during this procedure. A total of Versed 3 mg and Fentanyl 100 mcg was administered intravenously. Moderate Sedation Time: 20 minutes. The patient's level of consciousness and vital signs were monitored continuously by radiology nursing throughout the procedure under my direct supervision. FLUOROSCOPY TIME:  Fluoroscopic dose; 1 mGy COMPLICATIONS: None immediate. PROCEDURE: The procedure, risks, benefits, and alternatives were explained to the patient. Questions regarding the procedure were encouraged and answered. The patient understands and consents to the procedure. The RIGHT neck and chest were prepped with chlorhexidine in a sterile fashion, and a sterile drape was applied covering the operative field. Maximum barrier sterile technique with sterile gowns and gloves were used for the procedure. A timeout was performed prior to the initiation of the procedure. Local anesthesia was provided with 1% lidocaine  with epinephrine. After creating a small venotomy incision, a micropuncture kit was utilized to access the internal  jugular vein under direct, real-time ultrasound guidance. Ultrasound image documentation was performed. The microwire was kinked to measure appropriate catheter length. A subcutaneous port pocket was then created along the upper chest wall utilizing a combination of sharp and blunt dissection. The pocket was irrigated with sterile saline. A single lumen ISP power injectable port was chosen for placement. The 8 Fr catheter was tunneled from the port pocket site to the venotomy incision. The port was placed in the pocket. The external catheter was trimmed to appropriate length. At the venotomy, an 8 Fr peel-away sheath was placed over a guidewire under fluoroscopic guidance. The catheter was then placed through the sheath and the sheath was removed. Final catheter positioning was confirmed and documented with a fluoroscopic spot radiograph. The port was accessed with a Huber needle, aspirated and flushed with heparinized saline. The port pocket incision was closed with interrupted 3-0 Vicryl suture then Dermabond was applied, including at the venotomy incision. Dressings were placed. The patient tolerated the procedure well without immediate post procedural complication. IMPRESSION: Successful placement of a RIGHT internal jugular approach power injectable Port-A-Cath. The tip of the catheter is positioned within the proximal RIGHT atrium. The catheter is ready for immediate use. Roanna Banning, MD [facility] Electronically Signed   By: Roanna Banning M.D.   On: 09/14/2022 17:18   CT ANGIO GI BLEED  Result Date: 08/31/2022 CLINICAL DATA:  Acute mesenteric ischemia. Abdominal pain. History of myelodysplastic syndrome. EXAM: CTA ABDOMEN AND PELVIS WITHOUT AND WITH CONTRAST TECHNIQUE: Multidetector CT imaging of the abdomen and pelvis was performed using the standard protocol during bolus administration of intravenous contrast. Multiplanar reconstructed images  and MIPs were obtained and reviewed to evaluate the vascular anatomy. RADIATION DOSE REDUCTION: This exam was performed according to the departmental dose-optimization program which includes automated exposure control, adjustment of the mA and/or kV according to patient size and/or use of iterative reconstruction technique. CONTRAST:  OMNIPAQUE IOHEXOL 350 MG/ML SOLN COMPARISON:  Abdominal ultrasound examination 04/03/2021 FINDINGS: VASCULAR Aorta: Normal caliber abdominal aorta. Scattered atherosclerotic calcifications. No dissection. Celiac: Normal SMA: Normal Renals: Normal IMA: Patent Inflow: Normal Proximal Outflow: Normal Veins: Normal Review of the MIP images confirms the above findings. NON-VASCULAR Lower chest: Small left pleural effusion and bibasilar atelectasis. The heart is normal in size. No pericardial effusion. There is a moderate to large hiatal hernia. Hepatobiliary: No hepatic lesions or intrahepatic biliary dilatation. The gallbladder is unremarkable. No common bile duct dilatation. Pancreas: No mass, inflammation or ductal dilatation. Spleen: Massive splenomegaly. The spleen measures 20 x 16 x 11 cm. No splenic lesions or splenic infarct. Adrenals/Urinary Tract: The left kidney is displaced medially and inferiorly by the enlarged spleen. No worrisome renal lesions,, hydronephrosis or pyelonephritis. There is a lower pole right renal calculus and there are 2 distal left ureteral calculi more proximal calculus measures 4 mm and the more distal calculus measures 5 mm. I do not see any hydroureter or hydronephrosis. Stomach/Bowel: The stomach, duodenum, small bowel and colon are grossly normal. No inflammatory changes, mass lesions or obstructive findings. Lymphatic: No abdominal or pelvic lymphadenopathy. Reproductive: The prostate gland is enlarged. The seminal vesicles are unremarkable. Other: No pelvic mass or adenopathy. No free pelvic fluid collections. No inguinal mass or adenopathy. No  abdominal wall hernia or subcutaneous lesions. Musculoskeletal: No significant bony findings. IMPRESSION: 1. Normal caliber abdominal  aorta and no dissection. The branch vessels are normal. 2. Splenomegaly. 3. Two distal left ureteral calculi but no hydroureter or hydronephrosis. 4. Lower pole right renal calculus. 5. Moderate to large hiatal hernia. 6. Small left pleural effusion and bibasilar atelectasis. Electronically Signed   By: Rudie Meyer M.D.   On: 08/31/2022 18:13     ASSESSMENT & PLAN:   78 y.o. male with   #1 JAK2-positive myeloproliferative neoplasm-primarily presenting with thrombocytosis and associated MDS causing refractory anemia -No polycythemia.  -Mild leukocytosis.  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 Has grade 1 out of 3 reticulin fibrosis.  Uncertain if this is primary or secondary. -LDH has remained within normal limits. 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 -CT Angio GI bleed scan from 09/01/2022 showed kidney stones.and significant splenomegaly  PLAN:  -Discussed lab results on 12/16/2023 in detail with patient. CBC showed WBC of 6.3K, hemoglobin of 7.6, and platelets of 173K. -WBC and platelets stable -discussed the several factors that could be playing into his anemia, including the spleen holding on to more RBCs, as well as processes of the immune system and bone marrow -10/25/2023 [place] of abdomen showed that his splenomegaly was improving in size. Previous abdominal US on 03/18/2023 showed spleen size of 22.9 by 21.9 x 11.1 cm. [place] of abdomen on 10/25/2023 showed spleen measuring 18.8 x 19.7 x 10.3 cm.  -Patient has tolerated Vonjo 200 mg po BID with no major toxicity issues, and he shall continue this dose as this time -educated patient that there may be less lactase production with age -answered all of patient's and his wife's questions in detail -continue to follow-up with Dr Lowella Bandy @ [facility] -will discuss consideration  of iron chelation therapy.  Follow-up: Weekly labs and PRBC transfusion x 12 appointments  MD visit in 6 weeks  The total time spent in the appointment was 30 minutes* .  All of the patient's questions were answered with apparent satisfaction. The patient knows to call the clinic with any problems, questions or concerns.   Wyvonnia Lora MD MS AAHIVMS [facility] Hematology/Oncology Physician [facility]  .*Total Encounter Time as defined by the Centers for Medicare and Medicaid Services includes, in addition to the face-to-face time of a patient visit (documented in the note above) non-face-to-face time: obtaining and reviewing outside history, ordering and reviewing medications, tests or procedures, care coordination (communications with other health care professionals or caregivers) and documentation in the medical record.    I,Mitra Faeizi,acting as a Neurosurgeon for Wyvonnia Lora, MD.,have documented all relevant documentation on the behalf of Wyvonnia Lora, MD,as directed by  Wyvonnia Lora, MD while in the presence of Wyvonnia Lora, MD.  .I have reviewed the above documentation for accuracy and completeness, and I agree with the above. Johney Maine MD

## 2023-12-16 ENCOUNTER — Inpatient Hospital Stay: Payer: Medicare Other | Admitting: Hematology

## 2023-12-16 ENCOUNTER — Inpatient Hospital Stay: Payer: Medicare Other

## 2023-12-16 ENCOUNTER — Other Ambulatory Visit: Payer: Self-pay

## 2023-12-16 DIAGNOSIS — D469 Myelodysplastic syndrome, unspecified: Secondary | ICD-10-CM

## 2023-12-16 DIAGNOSIS — N202 Calculus of kidney with calculus of ureter: Secondary | ICD-10-CM | POA: Diagnosis not present

## 2023-12-16 DIAGNOSIS — Z79624 Long term (current) use of inhibitors of nucleotide synthesis: Secondary | ICD-10-CM | POA: Diagnosis not present

## 2023-12-16 DIAGNOSIS — D471 Chronic myeloproliferative disease: Secondary | ICD-10-CM

## 2023-12-16 DIAGNOSIS — I7090 Unspecified atherosclerosis: Secondary | ICD-10-CM | POA: Diagnosis not present

## 2023-12-16 DIAGNOSIS — D72829 Elevated white blood cell count, unspecified: Secondary | ICD-10-CM | POA: Diagnosis not present

## 2023-12-16 DIAGNOSIS — K219 Gastro-esophageal reflux disease without esophagitis: Secondary | ICD-10-CM | POA: Diagnosis not present

## 2023-12-16 DIAGNOSIS — D649 Anemia, unspecified: Secondary | ICD-10-CM

## 2023-12-16 DIAGNOSIS — D75839 Thrombocytosis, unspecified: Secondary | ICD-10-CM | POA: Diagnosis not present

## 2023-12-16 DIAGNOSIS — K449 Diaphragmatic hernia without obstruction or gangrene: Secondary | ICD-10-CM | POA: Diagnosis not present

## 2023-12-16 DIAGNOSIS — E782 Mixed hyperlipidemia: Secondary | ICD-10-CM | POA: Diagnosis not present

## 2023-12-16 DIAGNOSIS — Z8719 Personal history of other diseases of the digestive system: Secondary | ICD-10-CM | POA: Diagnosis not present

## 2023-12-16 DIAGNOSIS — J90 Pleural effusion, not elsewhere classified: Secondary | ICD-10-CM | POA: Diagnosis not present

## 2023-12-16 DIAGNOSIS — Z79899 Other long term (current) drug therapy: Secondary | ICD-10-CM | POA: Diagnosis not present

## 2023-12-16 DIAGNOSIS — Z87891 Personal history of nicotine dependence: Secondary | ICD-10-CM | POA: Diagnosis not present

## 2023-12-16 DIAGNOSIS — M1711 Unilateral primary osteoarthritis, right knee: Secondary | ICD-10-CM | POA: Diagnosis not present

## 2023-12-16 DIAGNOSIS — D464 Refractory anemia, unspecified: Secondary | ICD-10-CM | POA: Diagnosis not present

## 2023-12-16 LAB — CBC WITH DIFFERENTIAL (CANCER CENTER ONLY)
Abs Immature Granulocytes: 0.21 10*3/uL — ABNORMAL HIGH (ref 0.00–0.07)
Basophils Absolute: 0.2 10*3/uL — ABNORMAL HIGH (ref 0.0–0.1)
Basophils Relative: 3 %
Eosinophils Absolute: 0.4 10*3/uL (ref 0.0–0.5)
Eosinophils Relative: 6 %
HCT: 23.2 % — ABNORMAL LOW (ref 39.0–52.0)
Hemoglobin: 7.6 g/dL — ABNORMAL LOW (ref 13.0–17.0)
Immature Granulocytes: 3 %
Lymphocytes Relative: 8 %
Lymphs Abs: 0.5 10*3/uL — ABNORMAL LOW (ref 0.7–4.0)
MCH: 31.9 pg (ref 26.0–34.0)
MCHC: 32.8 g/dL (ref 30.0–36.0)
MCV: 97.5 fL (ref 80.0–100.0)
Monocytes Absolute: 0.6 10*3/uL (ref 0.1–1.0)
Monocytes Relative: 9 %
Neutro Abs: 4.4 10*3/uL (ref 1.7–7.7)
Neutrophils Relative %: 71 %
Platelet Count: 173 10*3/uL (ref 150–400)
RBC: 2.38 MIL/uL — ABNORMAL LOW (ref 4.22–5.81)
RDW: 20.9 % — ABNORMAL HIGH (ref 11.5–15.5)
WBC Count: 6.3 10*3/uL (ref 4.0–10.5)
nRBC: 0 % (ref 0.0–0.2)

## 2023-12-16 LAB — SAMPLE TO BLOOD BANK

## 2023-12-16 LAB — PREPARE RBC (CROSSMATCH)

## 2023-12-16 MED ORDER — METHYLPREDNISOLONE SODIUM SUCC 40 MG IJ SOLR
40.0000 mg | Freq: Once | INTRAMUSCULAR | Status: AC
  Administered 2023-12-16: 40 mg via INTRAVENOUS
  Filled 2023-12-16: qty 1

## 2023-12-16 MED ORDER — ACETAMINOPHEN 325 MG PO TABS
650.0000 mg | ORAL_TABLET | Freq: Once | ORAL | Status: AC
  Administered 2023-12-16: 650 mg via ORAL
  Filled 2023-12-16: qty 2

## 2023-12-16 MED ORDER — SODIUM CHLORIDE 0.9% FLUSH
10.0000 mL | Freq: Once | INTRAVENOUS | Status: AC
  Administered 2023-12-16: 10 mL

## 2023-12-16 MED ORDER — SODIUM CHLORIDE 0.9% IV SOLUTION
250.0000 mL | INTRAVENOUS | Status: DC
  Administered 2023-12-16: 100 mL via INTRAVENOUS

## 2023-12-19 DIAGNOSIS — Z0100 Encounter for examination of eyes and vision without abnormal findings: Secondary | ICD-10-CM | POA: Diagnosis not present

## 2023-12-19 LAB — TYPE AND SCREEN
ABO/RH(D): A POS
Antibody Screen: NEGATIVE
Unit division: 0

## 2023-12-19 LAB — BPAM RBC
Blood Product Expiration Date: 202503252359
ISSUE DATE / TIME: 202502281031
Unit Type and Rh: 6200

## 2023-12-21 ENCOUNTER — Other Ambulatory Visit: Payer: Self-pay

## 2023-12-21 DIAGNOSIS — D469 Myelodysplastic syndrome, unspecified: Secondary | ICD-10-CM

## 2023-12-22 ENCOUNTER — Inpatient Hospital Stay

## 2023-12-22 ENCOUNTER — Inpatient Hospital Stay: Attending: Hematology

## 2023-12-22 ENCOUNTER — Other Ambulatory Visit (HOSPITAL_COMMUNITY): Payer: Self-pay

## 2023-12-22 ENCOUNTER — Encounter: Payer: Self-pay | Admitting: Hematology

## 2023-12-22 VITALS — BP 115/58 | HR 65 | Temp 97.7°F | Resp 15 | Wt 165.5 lb

## 2023-12-22 DIAGNOSIS — D469 Myelodysplastic syndrome, unspecified: Secondary | ICD-10-CM

## 2023-12-22 DIAGNOSIS — Z79899 Other long term (current) drug therapy: Secondary | ICD-10-CM | POA: Diagnosis not present

## 2023-12-22 DIAGNOSIS — D464 Refractory anemia, unspecified: Secondary | ICD-10-CM | POA: Diagnosis not present

## 2023-12-22 LAB — CBC WITH DIFFERENTIAL (CANCER CENTER ONLY)
Abs Immature Granulocytes: 0.28 10*3/uL — ABNORMAL HIGH (ref 0.00–0.07)
Basophils Absolute: 0.2 10*3/uL — ABNORMAL HIGH (ref 0.0–0.1)
Basophils Relative: 3 %
Eosinophils Absolute: 0.5 10*3/uL (ref 0.0–0.5)
Eosinophils Relative: 7 %
HCT: 24.6 % — ABNORMAL LOW (ref 39.0–52.0)
Hemoglobin: 8 g/dL — ABNORMAL LOW (ref 13.0–17.0)
Immature Granulocytes: 4 %
Lymphocytes Relative: 8 %
Lymphs Abs: 0.6 10*3/uL — ABNORMAL LOW (ref 0.7–4.0)
MCH: 31.5 pg (ref 26.0–34.0)
MCHC: 32.5 g/dL (ref 30.0–36.0)
MCV: 96.9 fL (ref 80.0–100.0)
Monocytes Absolute: 0.6 10*3/uL (ref 0.1–1.0)
Monocytes Relative: 9 %
Neutro Abs: 4.8 10*3/uL (ref 1.7–7.7)
Neutrophils Relative %: 69 %
Platelet Count: 173 10*3/uL (ref 150–400)
RBC: 2.54 MIL/uL — ABNORMAL LOW (ref 4.22–5.81)
RDW: 21.2 % — ABNORMAL HIGH (ref 11.5–15.5)
WBC Count: 7 10*3/uL (ref 4.0–10.5)
nRBC: 0 % (ref 0.0–0.2)

## 2023-12-22 LAB — TYPE AND SCREEN
ABO/RH(D): A POS
Antibody Screen: NEGATIVE

## 2023-12-22 LAB — SAMPLE TO BLOOD BANK

## 2023-12-22 MED ORDER — SODIUM CHLORIDE 0.9% FLUSH
10.0000 mL | Freq: Once | INTRAVENOUS | Status: AC
  Administered 2023-12-22: 10 mL

## 2023-12-22 MED ORDER — HEPARIN SOD (PORK) LOCK FLUSH 100 UNIT/ML IV SOLN
500.0000 [IU] | Freq: Once | INTRAVENOUS | Status: AC
  Administered 2023-12-22: 500 [IU]

## 2023-12-22 MED ORDER — SODIUM CHLORIDE 0.9% FLUSH
10.0000 mL | Freq: Once | INTRAVENOUS | Status: AC
Start: 2023-12-22 — End: 2023-12-22
  Administered 2023-12-22: 10 mL

## 2023-12-22 NOTE — Progress Notes (Signed)
 Per Dr. Candise Che - patient does not require blood transfusion today.  Patient aware and agreeable to being discharged. PAC deaccessed. Pt ambulatory at time of discharge.

## 2023-12-29 ENCOUNTER — Other Ambulatory Visit: Payer: Self-pay

## 2023-12-29 ENCOUNTER — Inpatient Hospital Stay

## 2023-12-29 DIAGNOSIS — D469 Myelodysplastic syndrome, unspecified: Secondary | ICD-10-CM

## 2023-12-29 DIAGNOSIS — Z79899 Other long term (current) drug therapy: Secondary | ICD-10-CM | POA: Diagnosis not present

## 2023-12-29 DIAGNOSIS — D464 Refractory anemia, unspecified: Secondary | ICD-10-CM | POA: Diagnosis not present

## 2023-12-29 LAB — PREPARE RBC (CROSSMATCH)

## 2023-12-29 LAB — CBC WITH DIFFERENTIAL (CANCER CENTER ONLY)
Abs Immature Granulocytes: 0.18 10*3/uL — ABNORMAL HIGH (ref 0.00–0.07)
Basophils Absolute: 0.2 10*3/uL — ABNORMAL HIGH (ref 0.0–0.1)
Basophils Relative: 4 %
Eosinophils Absolute: 0.3 10*3/uL (ref 0.0–0.5)
Eosinophils Relative: 6 %
HCT: 21.5 % — ABNORMAL LOW (ref 39.0–52.0)
Hemoglobin: 7 g/dL — ABNORMAL LOW (ref 13.0–17.0)
Immature Granulocytes: 3 %
Lymphocytes Relative: 12 %
Lymphs Abs: 0.7 10*3/uL (ref 0.7–4.0)
MCH: 32 pg (ref 26.0–34.0)
MCHC: 32.6 g/dL (ref 30.0–36.0)
MCV: 98.2 fL (ref 80.0–100.0)
Monocytes Absolute: 0.6 10*3/uL (ref 0.1–1.0)
Monocytes Relative: 10 %
Neutro Abs: 4.1 10*3/uL (ref 1.7–7.7)
Neutrophils Relative %: 65 %
Platelet Count: 161 10*3/uL (ref 150–400)
RBC: 2.19 MIL/uL — ABNORMAL LOW (ref 4.22–5.81)
RDW: 21.7 % — ABNORMAL HIGH (ref 11.5–15.5)
WBC Count: 6.2 10*3/uL (ref 4.0–10.5)
nRBC: 0 % (ref 0.0–0.2)

## 2023-12-29 LAB — SAMPLE TO BLOOD BANK

## 2023-12-29 MED ORDER — METHYLPREDNISOLONE SODIUM SUCC 40 MG IJ SOLR
40.0000 mg | Freq: Once | INTRAMUSCULAR | Status: AC
  Administered 2023-12-29: 40 mg via INTRAVENOUS
  Filled 2023-12-29: qty 1

## 2023-12-29 MED ORDER — SODIUM CHLORIDE 0.9% FLUSH
10.0000 mL | Freq: Once | INTRAVENOUS | Status: AC
  Administered 2023-12-29: 10 mL

## 2023-12-29 MED ORDER — ACETAMINOPHEN 325 MG PO TABS
650.0000 mg | ORAL_TABLET | Freq: Once | ORAL | Status: AC
  Administered 2023-12-29: 650 mg via ORAL
  Filled 2023-12-29: qty 2

## 2023-12-29 MED ORDER — SODIUM CHLORIDE 0.9% IV SOLUTION
250.0000 mL | INTRAVENOUS | Status: DC
  Administered 2023-12-29: 100 mL via INTRAVENOUS

## 2023-12-29 NOTE — Patient Instructions (Signed)

## 2023-12-30 LAB — TYPE AND SCREEN
ABO/RH(D): A POS
Antibody Screen: NEGATIVE
Unit division: 0

## 2023-12-30 LAB — BPAM RBC
Blood Product Expiration Date: 202504122359
ISSUE DATE / TIME: 202503130945
Unit Type and Rh: 6200

## 2024-01-02 DIAGNOSIS — C946 Myelodysplastic disease, not classified: Secondary | ICD-10-CM | POA: Diagnosis not present

## 2024-01-05 ENCOUNTER — Inpatient Hospital Stay

## 2024-01-05 ENCOUNTER — Other Ambulatory Visit: Payer: Self-pay

## 2024-01-05 DIAGNOSIS — D464 Refractory anemia, unspecified: Secondary | ICD-10-CM | POA: Diagnosis not present

## 2024-01-05 DIAGNOSIS — D469 Myelodysplastic syndrome, unspecified: Secondary | ICD-10-CM

## 2024-01-05 DIAGNOSIS — Z79899 Other long term (current) drug therapy: Secondary | ICD-10-CM | POA: Diagnosis not present

## 2024-01-05 LAB — CBC WITH DIFFERENTIAL (CANCER CENTER ONLY)
Abs Immature Granulocytes: 0.12 10*3/uL — ABNORMAL HIGH (ref 0.00–0.07)
Basophils Absolute: 0.1 10*3/uL (ref 0.0–0.1)
Basophils Relative: 2 %
Eosinophils Absolute: 0.2 10*3/uL (ref 0.0–0.5)
Eosinophils Relative: 5 %
HCT: 20.8 % — ABNORMAL LOW (ref 39.0–52.0)
Hemoglobin: 6.9 g/dL — CL (ref 13.0–17.0)
Immature Granulocytes: 3 %
Lymphocytes Relative: 10 %
Lymphs Abs: 0.5 10*3/uL — ABNORMAL LOW (ref 0.7–4.0)
MCH: 32.1 pg (ref 26.0–34.0)
MCHC: 33.2 g/dL (ref 30.0–36.0)
MCV: 96.7 fL (ref 80.0–100.0)
Monocytes Absolute: 0.5 10*3/uL (ref 0.1–1.0)
Monocytes Relative: 10 %
Neutro Abs: 3.3 10*3/uL (ref 1.7–7.7)
Neutrophils Relative %: 70 %
Platelet Count: 147 10*3/uL — ABNORMAL LOW (ref 150–400)
RBC: 2.15 MIL/uL — ABNORMAL LOW (ref 4.22–5.81)
RDW: 21.1 % — ABNORMAL HIGH (ref 11.5–15.5)
WBC Count: 4.8 10*3/uL (ref 4.0–10.5)
nRBC: 0 % (ref 0.0–0.2)

## 2024-01-05 LAB — SAMPLE TO BLOOD BANK

## 2024-01-05 LAB — PREPARE RBC (CROSSMATCH)

## 2024-01-05 MED ORDER — ACETAMINOPHEN 325 MG PO TABS
650.0000 mg | ORAL_TABLET | Freq: Once | ORAL | Status: AC
  Administered 2024-01-05: 650 mg via ORAL
  Filled 2024-01-05: qty 2

## 2024-01-05 MED ORDER — METHYLPREDNISOLONE SODIUM SUCC 40 MG IJ SOLR: 40.0000 mg | Freq: Once | INTRAMUSCULAR | Status: DC

## 2024-01-05 MED ORDER — SODIUM CHLORIDE 0.9% FLUSH
10.0000 mL | Freq: Once | INTRAVENOUS | Status: AC
  Administered 2024-01-05: 10 mL

## 2024-01-05 MED ORDER — SODIUM CHLORIDE 0.9% IV SOLUTION
250.0000 mL | INTRAVENOUS | Status: DC
  Administered 2024-01-05: 100 mL via INTRAVENOUS

## 2024-01-05 MED ORDER — METHYLPREDNISOLONE SODIUM SUCC 40 MG IJ SOLR
40.0000 mg | Freq: Once | INTRAMUSCULAR | Status: AC
Start: 2024-01-05 — End: 2024-01-05
  Administered 2024-01-05: 40 mg via INTRAVENOUS
  Filled 2024-01-05: qty 1

## 2024-01-05 MED ORDER — HEPARIN SOD (PORK) LOCK FLUSH 100 UNIT/ML IV SOLN
250.0000 [IU] | INTRAVENOUS | Status: AC | PRN
  Administered 2024-01-05: 250 [IU]

## 2024-01-05 MED ORDER — SODIUM CHLORIDE 0.9% FLUSH
3.0000 mL | INTRAVENOUS | Status: AC | PRN
  Administered 2024-01-05: 3 mL

## 2024-01-05 NOTE — Patient Instructions (Signed)

## 2024-01-06 ENCOUNTER — Other Ambulatory Visit: Payer: Self-pay

## 2024-01-06 ENCOUNTER — Other Ambulatory Visit: Payer: Self-pay | Admitting: Hematology

## 2024-01-06 ENCOUNTER — Other Ambulatory Visit: Payer: Self-pay | Admitting: Pharmacy Technician

## 2024-01-06 LAB — TYPE AND SCREEN
ABO/RH(D): A POS
Antibody Screen: NEGATIVE
Unit division: 0

## 2024-01-06 LAB — BPAM RBC
Blood Product Expiration Date: 202504072359
ISSUE DATE / TIME: 202503201525
Unit Type and Rh: 6200

## 2024-01-06 NOTE — Progress Notes (Signed)
 Specialty Pharmacy Refill Coordination Note  Fernando Lee is a 78 y.o. male contacted today regarding refills of specialty medication(s) Pacritinib Citrate (VONJO)   Pacritinib Citrate [name])   Patient requested (Patient-Rptd) Pickup at [facility] at [facility] date: (Patient-Rptd) 01/12/24   Medication will be filled on 01/11/24.

## 2024-01-08 ENCOUNTER — Encounter: Payer: Self-pay | Admitting: Hematology

## 2024-01-10 ENCOUNTER — Encounter: Payer: Self-pay | Admitting: Hematology

## 2024-01-11 ENCOUNTER — Other Ambulatory Visit: Payer: Self-pay

## 2024-01-12 ENCOUNTER — Inpatient Hospital Stay

## 2024-01-12 ENCOUNTER — Other Ambulatory Visit (HOSPITAL_COMMUNITY): Payer: Self-pay

## 2024-01-12 ENCOUNTER — Other Ambulatory Visit: Payer: Self-pay | Admitting: *Deleted

## 2024-01-12 DIAGNOSIS — Z79899 Other long term (current) drug therapy: Secondary | ICD-10-CM | POA: Diagnosis not present

## 2024-01-12 DIAGNOSIS — D469 Myelodysplastic syndrome, unspecified: Secondary | ICD-10-CM

## 2024-01-12 DIAGNOSIS — D464 Refractory anemia, unspecified: Secondary | ICD-10-CM | POA: Diagnosis not present

## 2024-01-12 LAB — CBC WITH DIFFERENTIAL (CANCER CENTER ONLY)
Abs Immature Granulocytes: 0.19 10*3/uL — ABNORMAL HIGH (ref 0.00–0.07)
Basophils Absolute: 0.2 10*3/uL — ABNORMAL HIGH (ref 0.0–0.1)
Basophils Relative: 3 %
Eosinophils Absolute: 0.3 10*3/uL (ref 0.0–0.5)
Eosinophils Relative: 6 %
HCT: 22.9 % — ABNORMAL LOW (ref 39.0–52.0)
Hemoglobin: 7.5 g/dL — ABNORMAL LOW (ref 13.0–17.0)
Immature Granulocytes: 4 %
Lymphocytes Relative: 8 %
Lymphs Abs: 0.5 10*3/uL — ABNORMAL LOW (ref 0.7–4.0)
MCH: 30.7 pg (ref 26.0–34.0)
MCHC: 32.8 g/dL (ref 30.0–36.0)
MCV: 93.9 fL (ref 80.0–100.0)
Monocytes Absolute: 0.5 10*3/uL (ref 0.1–1.0)
Monocytes Relative: 9 %
Neutro Abs: 3.7 10*3/uL (ref 1.7–7.7)
Neutrophils Relative %: 70 %
Platelet Count: 149 10*3/uL — ABNORMAL LOW (ref 150–400)
RBC: 2.44 MIL/uL — ABNORMAL LOW (ref 4.22–5.81)
RDW: 20.2 % — ABNORMAL HIGH (ref 11.5–15.5)
WBC Count: 5.3 10*3/uL (ref 4.0–10.5)
nRBC: 0 % (ref 0.0–0.2)

## 2024-01-12 LAB — SAMPLE TO BLOOD BANK

## 2024-01-12 LAB — PREPARE RBC (CROSSMATCH)

## 2024-01-12 MED ORDER — SODIUM CHLORIDE 0.9% IV SOLUTION
250.0000 mL | INTRAVENOUS | Status: DC
  Administered 2024-01-12: 100 mL via INTRAVENOUS

## 2024-01-12 MED ORDER — METHYLPREDNISOLONE SODIUM SUCC 40 MG IJ SOLR
40.0000 mg | Freq: Once | INTRAMUSCULAR | Status: AC
  Administered 2024-01-12: 40 mg via INTRAVENOUS
  Filled 2024-01-12: qty 1

## 2024-01-12 MED ORDER — SODIUM CHLORIDE 0.9% FLUSH
10.0000 mL | INTRAVENOUS | Status: AC | PRN
Start: 2024-01-12 — End: 2024-01-12
  Administered 2024-01-12: 10 mL

## 2024-01-12 MED ORDER — SODIUM CHLORIDE 0.9% FLUSH
10.0000 mL | Freq: Once | INTRAVENOUS | Status: AC
  Administered 2024-01-12: 10 mL

## 2024-01-12 MED ORDER — HEPARIN SOD (PORK) LOCK FLUSH 100 UNIT/ML IV SOLN
500.0000 [IU] | Freq: Every day | INTRAVENOUS | Status: AC | PRN
Start: 2024-01-12 — End: 2024-01-12
  Administered 2024-01-12: 500 [IU]

## 2024-01-12 MED ORDER — ACETAMINOPHEN 325 MG PO TABS
650.0000 mg | ORAL_TABLET | Freq: Once | ORAL | Status: AC
  Administered 2024-01-12: 650 mg via ORAL
  Filled 2024-01-12: qty 2

## 2024-01-12 NOTE — Patient Instructions (Signed)

## 2024-01-13 LAB — TYPE AND SCREEN
ABO/RH(D): A POS
Antibody Screen: NEGATIVE
Unit division: 0

## 2024-01-13 LAB — BPAM RBC
Blood Product Expiration Date: 202504202359
ISSUE DATE / TIME: 202503270958
Unit Type and Rh: 6200

## 2024-01-18 ENCOUNTER — Other Ambulatory Visit: Payer: Self-pay

## 2024-01-18 ENCOUNTER — Encounter: Payer: Self-pay | Admitting: Hematology

## 2024-01-18 DIAGNOSIS — D469 Myelodysplastic syndrome, unspecified: Secondary | ICD-10-CM

## 2024-01-19 ENCOUNTER — Inpatient Hospital Stay: Payer: Self-pay

## 2024-01-19 ENCOUNTER — Inpatient Hospital Stay: Payer: Self-pay | Attending: Hematology

## 2024-01-19 VITALS — BP 126/68 | HR 65 | Temp 98.0°F | Resp 18

## 2024-01-19 DIAGNOSIS — K449 Diaphragmatic hernia without obstruction or gangrene: Secondary | ICD-10-CM | POA: Insufficient documentation

## 2024-01-19 DIAGNOSIS — D464 Refractory anemia, unspecified: Secondary | ICD-10-CM | POA: Insufficient documentation

## 2024-01-19 DIAGNOSIS — Z79624 Long term (current) use of inhibitors of nucleotide synthesis: Secondary | ICD-10-CM | POA: Diagnosis not present

## 2024-01-19 DIAGNOSIS — R109 Unspecified abdominal pain: Secondary | ICD-10-CM | POA: Insufficient documentation

## 2024-01-19 DIAGNOSIS — J90 Pleural effusion, not elsewhere classified: Secondary | ICD-10-CM | POA: Diagnosis not present

## 2024-01-19 DIAGNOSIS — N202 Calculus of kidney with calculus of ureter: Secondary | ICD-10-CM | POA: Insufficient documentation

## 2024-01-19 DIAGNOSIS — H538 Other visual disturbances: Secondary | ICD-10-CM | POA: Insufficient documentation

## 2024-01-19 DIAGNOSIS — D539 Nutritional anemia, unspecified: Secondary | ICD-10-CM | POA: Insufficient documentation

## 2024-01-19 DIAGNOSIS — I7090 Unspecified atherosclerosis: Secondary | ICD-10-CM | POA: Diagnosis not present

## 2024-01-19 DIAGNOSIS — D72829 Elevated white blood cell count, unspecified: Secondary | ICD-10-CM | POA: Diagnosis not present

## 2024-01-19 DIAGNOSIS — Z87891 Personal history of nicotine dependence: Secondary | ICD-10-CM | POA: Diagnosis not present

## 2024-01-19 DIAGNOSIS — N400 Enlarged prostate without lower urinary tract symptoms: Secondary | ICD-10-CM | POA: Insufficient documentation

## 2024-01-19 DIAGNOSIS — Z8719 Personal history of other diseases of the digestive system: Secondary | ICD-10-CM | POA: Diagnosis not present

## 2024-01-19 DIAGNOSIS — D469 Myelodysplastic syndrome, unspecified: Secondary | ICD-10-CM

## 2024-01-19 DIAGNOSIS — Z860101 Personal history of adenomatous and serrated colon polyps: Secondary | ICD-10-CM | POA: Insufficient documentation

## 2024-01-19 DIAGNOSIS — R161 Splenomegaly, not elsewhere classified: Secondary | ICD-10-CM | POA: Insufficient documentation

## 2024-01-19 DIAGNOSIS — M1711 Unilateral primary osteoarthritis, right knee: Secondary | ICD-10-CM | POA: Diagnosis not present

## 2024-01-19 DIAGNOSIS — Z79899 Other long term (current) drug therapy: Secondary | ICD-10-CM | POA: Diagnosis not present

## 2024-01-19 DIAGNOSIS — E785 Hyperlipidemia, unspecified: Secondary | ICD-10-CM | POA: Diagnosis not present

## 2024-01-19 DIAGNOSIS — D75839 Thrombocytosis, unspecified: Secondary | ICD-10-CM | POA: Diagnosis not present

## 2024-01-19 DIAGNOSIS — R232 Flushing: Secondary | ICD-10-CM | POA: Diagnosis not present

## 2024-01-19 LAB — TYPE AND SCREEN
ABO/RH(D): A POS
Antibody Screen: NEGATIVE

## 2024-01-19 LAB — CBC WITH DIFFERENTIAL (CANCER CENTER ONLY)
Abs Immature Granulocytes: 0.24 10*3/uL — ABNORMAL HIGH (ref 0.00–0.07)
Basophils Absolute: 0.2 10*3/uL — ABNORMAL HIGH (ref 0.0–0.1)
Basophils Relative: 4 %
Eosinophils Absolute: 0.4 10*3/uL (ref 0.0–0.5)
Eosinophils Relative: 6 %
HCT: 23.7 % — ABNORMAL LOW (ref 39.0–52.0)
Hemoglobin: 7.8 g/dL — ABNORMAL LOW (ref 13.0–17.0)
Immature Granulocytes: 4 %
Lymphocytes Relative: 8 %
Lymphs Abs: 0.6 10*3/uL — ABNORMAL LOW (ref 0.7–4.0)
MCH: 31 pg (ref 26.0–34.0)
MCHC: 32.9 g/dL (ref 30.0–36.0)
MCV: 94 fL (ref 80.0–100.0)
Monocytes Absolute: 0.6 10*3/uL (ref 0.1–1.0)
Monocytes Relative: 9 %
Neutro Abs: 4.6 10*3/uL (ref 1.7–7.7)
Neutrophils Relative %: 69 %
Platelet Count: 151 10*3/uL (ref 150–400)
RBC: 2.52 MIL/uL — ABNORMAL LOW (ref 4.22–5.81)
RDW: 19.8 % — ABNORMAL HIGH (ref 11.5–15.5)
WBC Count: 6.7 10*3/uL (ref 4.0–10.5)
nRBC: 0 % (ref 0.0–0.2)

## 2024-01-19 MED ORDER — HEPARIN SOD (PORK) LOCK FLUSH 100 UNIT/ML IV SOLN: 500.0000 [IU] | Freq: Once | INTRAVENOUS | Status: DC

## 2024-01-19 MED ORDER — SODIUM CHLORIDE 0.9% FLUSH
10.0000 mL | Freq: Once | INTRAVENOUS | Status: AC
  Administered 2024-01-19: 10 mL

## 2024-01-19 MED ORDER — SODIUM CHLORIDE 0.9% FLUSH: 10.0000 mL | Freq: Once | INTRAVENOUS | Status: DC

## 2024-01-19 NOTE — Progress Notes (Signed)
 Patient hgb resulted at 7.8 today. This is above parameters for blood transfusion of 7.5. Patient reports he feels well overall. VSS and port de-accessed. Patient provided a copy of his labs. He understands to call this office if he feels any signs or symptoms of needing transfusion support.

## 2024-01-25 ENCOUNTER — Other Ambulatory Visit: Payer: Self-pay

## 2024-01-25 DIAGNOSIS — D469 Myelodysplastic syndrome, unspecified: Secondary | ICD-10-CM

## 2024-01-26 ENCOUNTER — Inpatient Hospital Stay: Payer: Self-pay

## 2024-01-26 ENCOUNTER — Other Ambulatory Visit (HOSPITAL_COMMUNITY): Payer: Self-pay

## 2024-01-26 DIAGNOSIS — Z860101 Personal history of adenomatous and serrated colon polyps: Secondary | ICD-10-CM | POA: Diagnosis not present

## 2024-01-26 DIAGNOSIS — Z8719 Personal history of other diseases of the digestive system: Secondary | ICD-10-CM | POA: Diagnosis not present

## 2024-01-26 DIAGNOSIS — R109 Unspecified abdominal pain: Secondary | ICD-10-CM | POA: Diagnosis not present

## 2024-01-26 DIAGNOSIS — R232 Flushing: Secondary | ICD-10-CM | POA: Diagnosis not present

## 2024-01-26 DIAGNOSIS — Z87891 Personal history of nicotine dependence: Secondary | ICD-10-CM | POA: Diagnosis not present

## 2024-01-26 DIAGNOSIS — D464 Refractory anemia, unspecified: Secondary | ICD-10-CM | POA: Diagnosis not present

## 2024-01-26 DIAGNOSIS — Z79899 Other long term (current) drug therapy: Secondary | ICD-10-CM | POA: Diagnosis not present

## 2024-01-26 DIAGNOSIS — J90 Pleural effusion, not elsewhere classified: Secondary | ICD-10-CM | POA: Diagnosis not present

## 2024-01-26 DIAGNOSIS — E785 Hyperlipidemia, unspecified: Secondary | ICD-10-CM | POA: Diagnosis not present

## 2024-01-26 DIAGNOSIS — Z79624 Long term (current) use of inhibitors of nucleotide synthesis: Secondary | ICD-10-CM | POA: Diagnosis not present

## 2024-01-26 DIAGNOSIS — D539 Nutritional anemia, unspecified: Secondary | ICD-10-CM | POA: Diagnosis not present

## 2024-01-26 DIAGNOSIS — D72829 Elevated white blood cell count, unspecified: Secondary | ICD-10-CM | POA: Diagnosis not present

## 2024-01-26 DIAGNOSIS — K449 Diaphragmatic hernia without obstruction or gangrene: Secondary | ICD-10-CM | POA: Diagnosis not present

## 2024-01-26 DIAGNOSIS — D75839 Thrombocytosis, unspecified: Secondary | ICD-10-CM | POA: Diagnosis not present

## 2024-01-26 DIAGNOSIS — I7090 Unspecified atherosclerosis: Secondary | ICD-10-CM | POA: Diagnosis not present

## 2024-01-26 DIAGNOSIS — N202 Calculus of kidney with calculus of ureter: Secondary | ICD-10-CM | POA: Diagnosis not present

## 2024-01-26 DIAGNOSIS — H538 Other visual disturbances: Secondary | ICD-10-CM | POA: Diagnosis not present

## 2024-01-26 DIAGNOSIS — M1711 Unilateral primary osteoarthritis, right knee: Secondary | ICD-10-CM | POA: Diagnosis not present

## 2024-01-26 DIAGNOSIS — D469 Myelodysplastic syndrome, unspecified: Secondary | ICD-10-CM

## 2024-01-26 DIAGNOSIS — R161 Splenomegaly, not elsewhere classified: Secondary | ICD-10-CM | POA: Diagnosis not present

## 2024-01-26 LAB — CBC WITH DIFFERENTIAL (CANCER CENTER ONLY)
Abs Immature Granulocytes: 0.17 10*3/uL — ABNORMAL HIGH (ref 0.00–0.07)
Basophils Absolute: 0.2 10*3/uL — ABNORMAL HIGH (ref 0.0–0.1)
Basophils Relative: 4 %
Eosinophils Absolute: 0.4 10*3/uL (ref 0.0–0.5)
Eosinophils Relative: 7 %
HCT: 21.8 % — ABNORMAL LOW (ref 39.0–52.0)
Hemoglobin: 7.4 g/dL — ABNORMAL LOW (ref 13.0–17.0)
Immature Granulocytes: 3 %
Lymphocytes Relative: 10 %
Lymphs Abs: 0.6 10*3/uL — ABNORMAL LOW (ref 0.7–4.0)
MCH: 32.6 pg (ref 26.0–34.0)
MCHC: 33.9 g/dL (ref 30.0–36.0)
MCV: 96 fL (ref 80.0–100.0)
Monocytes Absolute: 0.5 10*3/uL (ref 0.1–1.0)
Monocytes Relative: 9 %
Neutro Abs: 3.8 10*3/uL (ref 1.7–7.7)
Neutrophils Relative %: 67 %
Platelet Count: 187 10*3/uL (ref 150–400)
RBC: 2.27 MIL/uL — ABNORMAL LOW (ref 4.22–5.81)
RDW: 21.2 % — ABNORMAL HIGH (ref 11.5–15.5)
WBC Count: 5.6 10*3/uL (ref 4.0–10.5)
nRBC: 0 % (ref 0.0–0.2)

## 2024-01-26 LAB — SAMPLE TO BLOOD BANK

## 2024-01-26 LAB — PREPARE RBC (CROSSMATCH)

## 2024-01-26 MED ORDER — ACETAMINOPHEN 325 MG PO TABS
650.0000 mg | ORAL_TABLET | Freq: Once | ORAL | Status: AC
  Administered 2024-01-26: 650 mg via ORAL
  Filled 2024-01-26: qty 2

## 2024-01-26 MED ORDER — METHYLPREDNISOLONE SODIUM SUCC 40 MG IJ SOLR
40.0000 mg | Freq: Once | INTRAMUSCULAR | Status: AC
  Administered 2024-01-26: 40 mg via INTRAVENOUS
  Filled 2024-01-26: qty 1

## 2024-01-26 MED ORDER — SODIUM CHLORIDE 0.9% IV SOLUTION
250.0000 mL | INTRAVENOUS | Status: DC
  Administered 2024-01-26: 100 mL via INTRAVENOUS

## 2024-01-26 MED ORDER — SODIUM CHLORIDE 0.9% FLUSH
10.0000 mL | INTRAVENOUS | Status: AC | PRN
  Administered 2024-01-26: 10 mL

## 2024-01-26 MED ORDER — SODIUM CHLORIDE 0.9% FLUSH
10.0000 mL | Freq: Once | INTRAVENOUS | Status: AC
  Administered 2024-01-26: 10 mL

## 2024-01-26 MED ORDER — HEPARIN SOD (PORK) LOCK FLUSH 100 UNIT/ML IV SOLN
500.0000 [IU] | Freq: Every day | INTRAVENOUS | Status: AC | PRN
  Administered 2024-01-26: 500 [IU]

## 2024-01-26 NOTE — Patient Instructions (Signed)

## 2024-01-27 ENCOUNTER — Other Ambulatory Visit: Payer: Self-pay | Admitting: Hematology

## 2024-01-27 ENCOUNTER — Other Ambulatory Visit: Payer: Self-pay

## 2024-01-27 DIAGNOSIS — D469 Myelodysplastic syndrome, unspecified: Secondary | ICD-10-CM

## 2024-01-27 LAB — BPAM RBC
Blood Product Expiration Date: 202505082359
ISSUE DATE / TIME: 202504101020
Unit Type and Rh: 6200

## 2024-01-27 LAB — TYPE AND SCREEN
ABO/RH(D): A POS
Antibody Screen: NEGATIVE
Unit division: 0

## 2024-01-27 MED ORDER — FUROSEMIDE 20 MG PO TABS: 20.0000 mg | ORAL_TABLET | Freq: Every day | ORAL | 0 refills | Status: DC

## 2024-01-27 MED ORDER — POTASSIUM CHLORIDE 20 MEQ PO PACK: 20.0000 meq | PACK | Freq: Every day | ORAL | 2 refills | Status: DC

## 2024-02-01 ENCOUNTER — Other Ambulatory Visit: Payer: Self-pay

## 2024-02-01 DIAGNOSIS — D469 Myelodysplastic syndrome, unspecified: Secondary | ICD-10-CM

## 2024-02-02 ENCOUNTER — Inpatient Hospital Stay: Payer: Self-pay

## 2024-02-02 DIAGNOSIS — H538 Other visual disturbances: Secondary | ICD-10-CM | POA: Diagnosis not present

## 2024-02-02 DIAGNOSIS — Z79899 Other long term (current) drug therapy: Secondary | ICD-10-CM | POA: Diagnosis not present

## 2024-02-02 DIAGNOSIS — K449 Diaphragmatic hernia without obstruction or gangrene: Secondary | ICD-10-CM | POA: Diagnosis not present

## 2024-02-02 DIAGNOSIS — M1711 Unilateral primary osteoarthritis, right knee: Secondary | ICD-10-CM | POA: Diagnosis not present

## 2024-02-02 DIAGNOSIS — D72829 Elevated white blood cell count, unspecified: Secondary | ICD-10-CM | POA: Diagnosis not present

## 2024-02-02 DIAGNOSIS — D464 Refractory anemia, unspecified: Secondary | ICD-10-CM | POA: Diagnosis not present

## 2024-02-02 DIAGNOSIS — Z860101 Personal history of adenomatous and serrated colon polyps: Secondary | ICD-10-CM | POA: Diagnosis not present

## 2024-02-02 DIAGNOSIS — E785 Hyperlipidemia, unspecified: Secondary | ICD-10-CM | POA: Diagnosis not present

## 2024-02-02 DIAGNOSIS — D469 Myelodysplastic syndrome, unspecified: Secondary | ICD-10-CM

## 2024-02-02 DIAGNOSIS — I7090 Unspecified atherosclerosis: Secondary | ICD-10-CM | POA: Diagnosis not present

## 2024-02-02 DIAGNOSIS — J90 Pleural effusion, not elsewhere classified: Secondary | ICD-10-CM | POA: Diagnosis not present

## 2024-02-02 DIAGNOSIS — D75839 Thrombocytosis, unspecified: Secondary | ICD-10-CM | POA: Diagnosis not present

## 2024-02-02 DIAGNOSIS — R232 Flushing: Secondary | ICD-10-CM | POA: Diagnosis not present

## 2024-02-02 DIAGNOSIS — R109 Unspecified abdominal pain: Secondary | ICD-10-CM | POA: Diagnosis not present

## 2024-02-02 DIAGNOSIS — R161 Splenomegaly, not elsewhere classified: Secondary | ICD-10-CM | POA: Diagnosis not present

## 2024-02-02 DIAGNOSIS — Z87891 Personal history of nicotine dependence: Secondary | ICD-10-CM | POA: Diagnosis not present

## 2024-02-02 DIAGNOSIS — Z79624 Long term (current) use of inhibitors of nucleotide synthesis: Secondary | ICD-10-CM | POA: Diagnosis not present

## 2024-02-02 DIAGNOSIS — N202 Calculus of kidney with calculus of ureter: Secondary | ICD-10-CM | POA: Diagnosis not present

## 2024-02-02 DIAGNOSIS — D539 Nutritional anemia, unspecified: Secondary | ICD-10-CM | POA: Diagnosis not present

## 2024-02-02 DIAGNOSIS — Z8719 Personal history of other diseases of the digestive system: Secondary | ICD-10-CM | POA: Diagnosis not present

## 2024-02-02 LAB — CBC WITH DIFFERENTIAL (CANCER CENTER ONLY)
Abs Immature Granulocytes: 0.14 10*3/uL — ABNORMAL HIGH (ref 0.00–0.07)
Basophils Absolute: 0.2 10*3/uL — ABNORMAL HIGH (ref 0.0–0.1)
Basophils Relative: 4 %
Eosinophils Absolute: 0.4 10*3/uL (ref 0.0–0.5)
Eosinophils Relative: 7 %
HCT: 21 % — ABNORMAL LOW (ref 39.0–52.0)
Hemoglobin: 7.1 g/dL — ABNORMAL LOW (ref 13.0–17.0)
Immature Granulocytes: 3 %
Lymphocytes Relative: 10 %
Lymphs Abs: 0.5 10*3/uL — ABNORMAL LOW (ref 0.7–4.0)
MCH: 32 pg (ref 26.0–34.0)
MCHC: 33.8 g/dL (ref 30.0–36.0)
MCV: 94.6 fL (ref 80.0–100.0)
Monocytes Absolute: 0.5 10*3/uL (ref 0.1–1.0)
Monocytes Relative: 10 %
Neutro Abs: 3.8 10*3/uL (ref 1.7–7.7)
Neutrophils Relative %: 66 %
Platelet Count: 144 10*3/uL — ABNORMAL LOW (ref 150–400)
RBC: 2.22 MIL/uL — ABNORMAL LOW (ref 4.22–5.81)
RDW: 21.2 % — ABNORMAL HIGH (ref 11.5–15.5)
WBC Count: 5.6 10*3/uL (ref 4.0–10.5)
nRBC: 0 % (ref 0.0–0.2)

## 2024-02-02 LAB — SAMPLE TO BLOOD BANK

## 2024-02-02 LAB — PREPARE RBC (CROSSMATCH)

## 2024-02-02 MED ORDER — SODIUM CHLORIDE 0.9% FLUSH
10.0000 mL | Freq: Once | INTRAVENOUS | Status: AC
  Administered 2024-02-02: 10 mL

## 2024-02-02 MED ORDER — METHYLPREDNISOLONE SODIUM SUCC 40 MG IJ SOLR
40.0000 mg | Freq: Once | INTRAMUSCULAR | Status: AC
  Administered 2024-02-02: 40 mg via INTRAVENOUS
  Filled 2024-02-02: qty 1

## 2024-02-02 MED ORDER — SODIUM CHLORIDE 0.9% IV SOLUTION
250.0000 mL | INTRAVENOUS | Status: DC
Start: 2024-02-02 — End: 2024-02-02
  Administered 2024-02-02: 100 mL via INTRAVENOUS

## 2024-02-02 MED ORDER — ACETAMINOPHEN 325 MG PO TABS
650.0000 mg | ORAL_TABLET | Freq: Once | ORAL | Status: AC
  Administered 2024-02-02: 650 mg via ORAL
  Filled 2024-02-02: qty 2

## 2024-02-02 NOTE — Patient Instructions (Signed)

## 2024-02-03 ENCOUNTER — Other Ambulatory Visit: Payer: Self-pay

## 2024-02-03 LAB — BPAM RBC
Blood Product Expiration Date: 202505152359
ISSUE DATE / TIME: 202504170942
Unit Type and Rh: 6200

## 2024-02-03 LAB — TYPE AND SCREEN
ABO/RH(D): A POS
Antibody Screen: NEGATIVE
Unit division: 0

## 2024-02-06 ENCOUNTER — Other Ambulatory Visit: Payer: Self-pay | Admitting: Hematology

## 2024-02-06 ENCOUNTER — Other Ambulatory Visit: Payer: Self-pay

## 2024-02-06 DIAGNOSIS — D471 Chronic myeloproliferative disease: Secondary | ICD-10-CM

## 2024-02-06 DIAGNOSIS — D469 Myelodysplastic syndrome, unspecified: Secondary | ICD-10-CM

## 2024-02-06 MED ORDER — VONJO 100 MG PO CAPS
200.0000 mg | ORAL_CAPSULE | Freq: Two times a day (BID) | ORAL | 2 refills | Status: DC
  Filled 2024-02-06: qty 120, 30d supply, fill #0
  Filled 2024-03-08: qty 120, 30d supply, fill #1
  Filled 2024-04-11: qty 120, 30d supply, fill #2

## 2024-02-06 NOTE — Progress Notes (Signed)
 Specialty Pharmacy Ongoing Clinical Assessment Note  Fernando Lee is a 78 y.o. male who is being followed by the specialty pharmacy service for RxSp Oncology   Patient's specialty medication(s) reviewed today: Pacritinib  Citrate (VONJO )   Missed doses in the last 4 weeks: 0   Patient/Caregiver did not have any additional questions or concerns.   Therapeutic benefit summary: Unable to assess   Adverse events/side effects summary: Experienced adverse events/side effects (Progressive worsening of vision in left eye over the last 6 months. unsure if related to treatment and will speak with Dr. Salomon Cree at appointment Friday and make an appointment with eye specialist soon.)   Patient's therapy is appropriate to: Continue    Goals Addressed             This Visit's Progress    Slow Disease Progression       Patient is  unable to be assessed as therapy has recently been initiated . Patient will maintain adherence and adhere to provider and/or lab appointments. Patient has follow up appointment 02/10/24.            Follow up:  3 months  Aarica Wax M Cace Osorto Specialty Pharmacist

## 2024-02-06 NOTE — Progress Notes (Signed)
 Specialty Pharmacy Refill Coordination Note  Fernando Lee is a 78 y.o. male contacted today regarding refills of specialty medication(s) Pacritinib  Citrate (VONJO )   Patient requested [name] at [facility] at [place] date: 02/10/24   Medication will be filled on 02/09/24.

## 2024-02-09 ENCOUNTER — Other Ambulatory Visit: Payer: Self-pay

## 2024-02-09 DIAGNOSIS — D469 Myelodysplastic syndrome, unspecified: Secondary | ICD-10-CM

## 2024-02-10 ENCOUNTER — Other Ambulatory Visit: Payer: Self-pay

## 2024-02-10 ENCOUNTER — Inpatient Hospital Stay: Payer: Self-pay | Admitting: Hematology

## 2024-02-10 ENCOUNTER — Telehealth: Payer: Self-pay

## 2024-02-10 ENCOUNTER — Inpatient Hospital Stay: Payer: Self-pay

## 2024-02-10 VITALS — BP 112/64 | HR 65 | Temp 98.2°F | Resp 17

## 2024-02-10 VITALS — BP 106/57 | HR 63 | Temp 97.7°F | Resp 17 | Wt 162.6 lb

## 2024-02-10 DIAGNOSIS — K449 Diaphragmatic hernia without obstruction or gangrene: Secondary | ICD-10-CM | POA: Diagnosis not present

## 2024-02-10 DIAGNOSIS — D469 Myelodysplastic syndrome, unspecified: Secondary | ICD-10-CM

## 2024-02-10 DIAGNOSIS — D539 Nutritional anemia, unspecified: Secondary | ICD-10-CM | POA: Diagnosis not present

## 2024-02-10 DIAGNOSIS — Z860101 Personal history of adenomatous and serrated colon polyps: Secondary | ICD-10-CM | POA: Diagnosis not present

## 2024-02-10 DIAGNOSIS — Z87891 Personal history of nicotine dependence: Secondary | ICD-10-CM | POA: Diagnosis not present

## 2024-02-10 DIAGNOSIS — Z79624 Long term (current) use of inhibitors of nucleotide synthesis: Secondary | ICD-10-CM | POA: Diagnosis not present

## 2024-02-10 DIAGNOSIS — R109 Unspecified abdominal pain: Secondary | ICD-10-CM | POA: Diagnosis not present

## 2024-02-10 DIAGNOSIS — Z79899 Other long term (current) drug therapy: Secondary | ICD-10-CM | POA: Diagnosis not present

## 2024-02-10 DIAGNOSIS — R232 Flushing: Secondary | ICD-10-CM | POA: Diagnosis not present

## 2024-02-10 DIAGNOSIS — Z1589 Genetic susceptibility to other disease: Secondary | ICD-10-CM

## 2024-02-10 DIAGNOSIS — D75839 Thrombocytosis, unspecified: Secondary | ICD-10-CM | POA: Diagnosis not present

## 2024-02-10 DIAGNOSIS — D471 Chronic myeloproliferative disease: Secondary | ICD-10-CM

## 2024-02-10 DIAGNOSIS — R161 Splenomegaly, not elsewhere classified: Secondary | ICD-10-CM | POA: Diagnosis not present

## 2024-02-10 DIAGNOSIS — N202 Calculus of kidney with calculus of ureter: Secondary | ICD-10-CM | POA: Diagnosis not present

## 2024-02-10 DIAGNOSIS — J90 Pleural effusion, not elsewhere classified: Secondary | ICD-10-CM | POA: Diagnosis not present

## 2024-02-10 DIAGNOSIS — D649 Anemia, unspecified: Secondary | ICD-10-CM

## 2024-02-10 DIAGNOSIS — Z8719 Personal history of other diseases of the digestive system: Secondary | ICD-10-CM | POA: Diagnosis not present

## 2024-02-10 DIAGNOSIS — M1711 Unilateral primary osteoarthritis, right knee: Secondary | ICD-10-CM | POA: Diagnosis not present

## 2024-02-10 DIAGNOSIS — D464 Refractory anemia, unspecified: Secondary | ICD-10-CM | POA: Diagnosis not present

## 2024-02-10 DIAGNOSIS — E785 Hyperlipidemia, unspecified: Secondary | ICD-10-CM | POA: Diagnosis not present

## 2024-02-10 DIAGNOSIS — H538 Other visual disturbances: Secondary | ICD-10-CM | POA: Diagnosis not present

## 2024-02-10 DIAGNOSIS — D72829 Elevated white blood cell count, unspecified: Secondary | ICD-10-CM | POA: Diagnosis not present

## 2024-02-10 DIAGNOSIS — I7090 Unspecified atherosclerosis: Secondary | ICD-10-CM | POA: Diagnosis not present

## 2024-02-10 LAB — SAMPLE TO BLOOD BANK

## 2024-02-10 LAB — CBC WITH DIFFERENTIAL (CANCER CENTER ONLY)
Abs Immature Granulocytes: 0.18 10*3/uL — ABNORMAL HIGH (ref 0.00–0.07)
Basophils Absolute: 0.1 10*3/uL (ref 0.0–0.1)
Basophils Relative: 2 %
Eosinophils Absolute: 0.3 10*3/uL (ref 0.0–0.5)
Eosinophils Relative: 5 %
HCT: 20.8 % — ABNORMAL LOW (ref 39.0–52.0)
Hemoglobin: 6.9 g/dL — CL (ref 13.0–17.0)
Immature Granulocytes: 3 %
Lymphocytes Relative: 10 %
Lymphs Abs: 0.6 10*3/uL — ABNORMAL LOW (ref 0.7–4.0)
MCH: 31.4 pg (ref 26.0–34.0)
MCHC: 33.2 g/dL (ref 30.0–36.0)
MCV: 94.5 fL (ref 80.0–100.0)
Monocytes Absolute: 0.7 10*3/uL (ref 0.1–1.0)
Monocytes Relative: 11 %
Neutro Abs: 4.2 10*3/uL (ref 1.7–7.7)
Neutrophils Relative %: 69 %
Platelet Count: 135 10*3/uL — ABNORMAL LOW (ref 150–400)
RBC: 2.2 MIL/uL — ABNORMAL LOW (ref 4.22–5.81)
RDW: 21.2 % — ABNORMAL HIGH (ref 11.5–15.5)
WBC Count: 6.1 10*3/uL (ref 4.0–10.5)
nRBC: 0 % (ref 0.0–0.2)

## 2024-02-10 LAB — PREPARE RBC (CROSSMATCH)

## 2024-02-10 MED ORDER — SODIUM CHLORIDE 0.9% FLUSH
10.0000 mL | Freq: Once | INTRAVENOUS | Status: AC
  Administered 2024-02-10: 10 mL

## 2024-02-10 MED ORDER — ACETAMINOPHEN 325 MG PO TABS
650.0000 mg | ORAL_TABLET | Freq: Once | ORAL | Status: AC
Start: 2024-02-10 — End: 2024-02-10
  Administered 2024-02-10: 650 mg via ORAL
  Filled 2024-02-10: qty 2

## 2024-02-10 MED ORDER — HEPARIN SOD (PORK) LOCK FLUSH 100 UNIT/ML IV SOLN
500.0000 [IU] | Freq: Once | INTRAVENOUS | Status: AC
  Administered 2024-02-10: 500 [IU]

## 2024-02-10 MED ORDER — METHYLPREDNISOLONE SODIUM SUCC 40 MG IJ SOLR
40.0000 mg | Freq: Once | INTRAMUSCULAR | Status: AC
  Administered 2024-02-10: 40 mg via INTRAVENOUS
  Filled 2024-02-10: qty 1

## 2024-02-10 MED ORDER — SODIUM CHLORIDE 0.9% IV SOLUTION
250.0000 mL | INTRAVENOUS | Status: DC
Start: 2024-02-10 — End: 2024-02-10
  Administered 2024-02-10: 250 mL via INTRAVENOUS

## 2024-02-10 NOTE — Progress Notes (Signed)
 Patient seen by Dr. Minnie Amber are within treatment parameters.  Labs reviewed: and are not all within treatment parameters.   HGB 6.9, Plts 135  Per physician team,     Pt to get 1 unit of blood today, orders placed BB aware

## 2024-02-10 NOTE — Progress Notes (Signed)
 HEMATOLOGY/ONCOLOGY PROGRESS NOTE:   Date of Service: 02/10/24  Patient Care Team: Mordechai April, DO as PCP - General (Family Medicine)  CHIEF COMPLAINTS:  Follow-up for continued evaluation and management of JAK2 positive myeloproliferative neoplasm/MDS  INTERVAL HISTORY:  [name] is a 78 y.o. male here for continued evaluation and management of his MPN/MDS with refractory anemia and thrombocytosis.   Patient was last seen by me on 12/16/2023 and complained of GI issues with dairy products and fluctuating energy levels.  He was seen by Dr. Corinne Dicker on 01/02/2024 and reported SOB with low hgb and drenching night sweats twice a week. There were no major changes in Dr. Ole Berkeley plans and she agreed that his spleen was getting smaller in size.   Today, he reports that his energy levels have been stable. He was noted to have been transfused during his last visit with hgb 7.1. Patient notes that he can sometimes can go 14 days without requiring a blood transfusion. He will get transfused today with hgb 6.9.  Patient is tolerating Vonjo  200 mg po BID without any major toxicity issues.   He complains of mild blurry vision in his left eye. Patient denies any concern for black floaters. He notes noticing that his vision has worsened over the last few months. He denies any seasonal allergies at this time, itching, redness, or dry eyes. Patient reports an upcoming appointment with his eye doctor.   Patient notes hx of torn retina. He reports that he was seen by Dr. Augustus Ledger, who spot-welded around it 3x. He reports that at this time, he can see a floater on bright days, but his brain has otherwise adjusted to initial vision changes.   He reports endorsing abdominal pain sometimes in the ribs which he believes may be attributed to yard work.   Patient complains of loss of libido and expresses interest in trying Viagra.   Patient reports mild leg swelling and is on lasix  as needed. He notes  increased leg swelling when he is not well-hydrated.   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   Internal hemorrhoids    Kidney stones    Male erectile dysfunction, unspecified    Mixed dyslipidemia    Mixed hyperlipidemia    Ocular migraine    PAC (premature atrial contraction)    Personal history of colonic polyps    Prediabetes    Residual hemorrhoidal skin tags    Spleen enlarged    nov 2023 hospitalized   Unspecified hemorrhoids     SURGICAL HISTORY: Past Surgical History:  Procedure Laterality Date   BONE MARROW BIOPSY     IR IMAGING GUIDED PORT INSERTION  09/14/2022   KIDNEY STONE SURGERY     retrieval    SOCIAL HISTORY: Social History   Socioeconomic History   Marital status: Married    Spouse name: Not on file   Number of children: Not on file   Years of education: Not on file   Highest education level: Not on file  Occupational History   Not on file  Tobacco Use  Smoking status: Former    Types: Cigarettes   Smokeless tobacco: Never  Substance and Sexual Activity   Alcohol use: Yes    Alcohol/week: 1.0 standard drink of alcohol    Types: 1 Cans of beer per week   Drug use: Not on file   Sexual activity: Not on file  Other Topics Concern   Not on file  Social History Narrative   Not on file   Social Drivers of Health   Financial Resource Strain: Not on file  Food Insecurity: Patient Declined (09/16/2023)   Hunger Vital Sign    Worried About Running Out of Food in the Last Year: Patient declined    Ran Out of Food in the Last Year: Patient declined  Transportation Needs: Patient Declined (09/16/2023)   PRAPARE -  Administrator, Civil Service (Medical): Patient declined    Lack of Transportation (Non-Medical): Patient declined  Physical Activity: Not on file  Stress: Not on file  Social Connections: Not on file  Intimate Partner Violence: Patient Declined (09/16/2023)   Humiliation, Afraid, Rape, and Kick questionnaire    Fear of Current or Ex-Partner: Patient declined    Emotionally Abused: Patient declined    Physically Abused: Patient declined    Sexually Abused: Patient declined    FAMILY HISTORY: Family History  Problem Relation Age of Onset   Stroke Brother    Congestive Heart Failure Brother     ALLERGIES:  has no known allergies.  MEDICATIONS:  Current Outpatient Medications  Medication Sig Dispense Refill   potassium chloride  SA (KLOR-CON  M) 20 MEQ tablet Take 1 tablet (20 mEq total) by mouth 2 (two) times daily. 30 tablet 2   acetaminophen  (TYLENOL ) 325 MG tablet Take 2 tablets (650 mg total) by mouth every 6 (six) hours as needed for mild pain (pain score 1-3) (or Fever >/= 101). 30 tablet 0   alfuzosin  (UROXATRAL ) 10 MG 24 hr tablet Take 10 mg by mouth daily with breakfast.     B Complex Vitamins (B COMPLEX PO) Take 1 tablet by mouth daily.     Cholecalciferol  (VITAMIN D3) 50 MCG (2000 UT) TABS Take 2,000 Units by mouth daily.     docusate sodium  (COLACE) 100 MG capsule Take 100 mg by mouth 2 (two) times daily.     fish oil-omega-3 fatty acids  1000 MG capsule Take 1 g by mouth daily.     furosemide  (LASIX ) 20 MG tablet Take 1 tablet (20 mg total) by mouth daily. 30 tablet 0   GLUCOSAMINE CHONDROITIN MSM PO Take 1 tablet by mouth daily.     Multiple Vitamin (MULTIVITAMIN) tablet Take 1 tablet by mouth daily with breakfast.     pacritinib  citrate (VONJO ) 100 MG capsule Take 2 capsules (200 mg total) by mouth 2 (two) times daily. 120 capsule 2   Turmeric (QC TUMERIC COMPLEX PO) Take 750 mg by mouth daily.     valACYclovir  (VALTREX ) 500 MG tablet Take 1 tablet (500 mg  total) by mouth 2 (two) times daily. 60 tablet 5   vitamin C  (ASCORBIC ACID ) 250 MG tablet Take 500 mg by mouth daily.     No current facility-administered medications for this visit.    REVIEW OF SYSTEMS:    10 Point review of Systems was done is negative except as noted above.   PHYSICAL EXAMINATION: .BP (!) 106/57 (BP Location: Left Arm, Patient Position: Sitting)   Pulse 63   Temp 97.7 F (36.5 C) (Temporal)   Resp  17   Wt 162 lb 9.6 oz (73.8 kg)   SpO2 99%   BMI 25.47 kg/m  GENERAL:alert, in no acute distress and comfortable SKIN: no acute rashes, no significant lesions EYES: conjunctiva are pink and non-injected, sclera anicteric OROPHARYNX: MMM, no exudates, no oropharyngeal erythema or ulceration NECK: supple, no JVD LYMPH:  no palpable lymphadenopathy in the cervical, axillary or inguinal regions LUNGS: clear to auscultation b/l with normal respiratory effort HEART: regular rate & rhythm ABDOMEN:  normoactive bowel sounds , non tender, not distended. Extremity: no pedal edema PSYCH: alert & oriented x 3 with fluent speech NEURO: no focal motor/sensory deficits   LABORATORY DATA:  I have reviewed the data as listed .    Latest Ref Rng & Units 02/02/2024    7:37 AM 01/26/2024    7:37 AM 01/19/2024    8:02 AM  CBC  WBC 4.0 - 10.5 K/uL 5.6  5.6  6.7   Hemoglobin 13.0 - 17.0 g/dL 7.1  7.4  7.8   Hematocrit 39.0 - 52.0 % 21.0  21.8  23.7   Platelets 150 - 400 K/uL 144  187  151      Iron /TIBC/Ferritin/ %Sat    Component Value Date/Time   IRON  176 11/24/2023 0730   TIBC 185 (L) 11/24/2023 0730   FERRITIN 3,824 (H) 11/24/2023 0730   IRONPCTSAT 95 (H) 11/24/2023 0730      Latest Ref Rng & Units 09/19/2023    3:49 AM 09/18/2023    3:30 AM 09/17/2023    3:47 AM  CMP  Glucose 70 - 99 mg/dL 161  096  045   BUN 8 - 23 mg/dL 16  19  22    Creatinine 0.61 - 1.24 mg/dL 4.09  8.11  9.14   Sodium 135 - 145 mmol/L 135  135  136   Potassium 3.5 - 5.1 mmol/L 4.1  3.8   4.1   Chloride 98 - 111 mmol/L 103  104  103   CO2 22 - 32 mmol/L 26  26  26    Calcium  8.9 - 10.3 mg/dL 8.5  8.3  8.6   Total Protein 6.5 - 8.1 g/dL   5.4   Total Bilirubin <1.2 mg/dL   1.0   Alkaline Phos 38 - 126 U/L   30   AST 15 - 41 U/L   24   ALT 0 - 44 U/L   46     Lab Results  Component Value Date   LDH 110 01/04/2022    02/23/2021 BCR ABL    02/23/2021 JAK2   Surgical Pathology  CASE: [id number]  PATIENT: Wyley Titterington  Bone Marrow Report  Clinical History: MPN with progressive anemia  (BH)  DIAGNOSIS:   BONE MARROW, ASPIRATE, CLOT, CORE:  -Hypercellular bone marrow with features of myeloid neoplasm  -See comment   PERIPHERAL BLOOD:  -Macrocytic anemia  -Leukocytosis  -Thrombocytosis   COMMENT:   The bone marrow/peripheral blood show persistent involvement by  previously known myeloid neoplasm.  There is a myeloproliferative  component as supported by previous JAK2 positivity.  However, there are  also dyspoietic changes primarily involving the megakaryocytic cell line  with numerous hypolobated/unilobated forms in addition to  dysgranulopoiesis to a lesser extent.  This is associated with  eosinophilia.  It is not entirely clear whether the overall findings  represent a myeloproliferative neoplasm with treatment related changes  or represent a primary myeloproliferative/myelodysplastic neoplasm  including but not limited to myeloid neoplasms with eosinophilia.  Correlation  with cytogenetic and FISH studies strongly recommended.      RADIOGRAPHIC STUDIES: I have personally reviewed the radiological images as listed and agreed with the findings in the report. No results found.    IR IMAGING GUIDED PORT INSERTION  Result Date: 09/14/2022 INDICATION: Poor IV access.  Myelodysplastic syndrome EXAM: IMPLANTED PORT A CATH PLACEMENT WITH ULTRASOUND AND FLUOROSCOPIC GUIDANCE MEDICATIONS: None ANESTHESIA/SEDATION: Moderate (conscious) sedation was  employed during this procedure. A total of Versed  3 mg and Fentanyl  100 mcg was administered intravenously. Moderate Sedation Time: 20 minutes. The patient's level of consciousness and vital signs were monitored continuously by radiology nursing throughout the procedure under my direct supervision. FLUOROSCOPY TIME:  Fluoroscopic dose; 1 mGy COMPLICATIONS: None immediate. PROCEDURE: The procedure, risks, benefits, and alternatives were explained to the patient. Questions regarding the procedure were encouraged and answered. The patient understands and consents to the procedure. The RIGHT neck and chest were prepped with chlorhexidine  in a sterile fashion, and a sterile drape was applied covering the operative field. Maximum barrier sterile technique with sterile gowns and gloves were used for the procedure. A timeout was performed prior to the initiation of the procedure. Local anesthesia was provided with 1% lidocaine  with epinephrine . After creating a small venotomy incision, a micropuncture kit was utilized to access the internal jugular vein under direct, real-time ultrasound guidance. Ultrasound image documentation was performed. The microwire was kinked to measure appropriate catheter length. A subcutaneous port pocket was then created along the upper chest wall utilizing a combination of sharp and blunt dissection. The pocket was irrigated with sterile saline. A single lumen ISP power injectable port was chosen for placement. The 8 Fr catheter was tunneled from the port pocket site to the venotomy incision. The port was placed in the pocket. The external catheter was trimmed to appropriate length. At the venotomy, an 8 Fr peel-away sheath was placed over a guidewire under fluoroscopic guidance. The catheter was then placed through the sheath and the sheath was removed. Final catheter positioning was confirmed and documented with a fluoroscopic spot radiograph. The port was accessed with a Huber needle,  aspirated and flushed with heparinized saline. The port pocket incision was closed with interrupted 3-0 Vicryl suture then Dermabond was applied, including at the venotomy incision. Dressings were placed. The patient tolerated the procedure well without immediate post procedural complication. IMPRESSION: Successful placement of a RIGHT internal jugular approach power injectable Port-A-Cath. The tip of the catheter is positioned within the proximal RIGHT atrium. The catheter is ready for immediate use. Art Largo, MD [facility] Radiology Electronically Signed   By: Art Largo M.D.   On: 09/14/2022 17:18   CT ANGIO GI BLEED  Result Date: 08/31/2022 CLINICAL DATA:  Acute mesenteric ischemia. Abdominal pain. History of myelodysplastic syndrome. EXAM: CTA ABDOMEN AND PELVIS WITHOUT AND WITH CONTRAST TECHNIQUE: Multidetector CT imaging of the abdomen and pelvis was performed using the standard protocol during bolus administration of intravenous contrast. Multiplanar reconstructed images and MIPs were obtained and reviewed to evaluate the vascular anatomy. RADIATION DOSE REDUCTION: This exam was performed according to the departmental dose-optimization program which includes automated exposure control, adjustment of the mA and/or kV according to patient size and/or use of iterative reconstruction technique. CONTRAST:  OMNIPAQUE  IOHEXOL  350 MG/ML SOLN COMPARISON:  Abdominal ultrasound examination 04/03/2021 FINDINGS: VASCULAR Aorta: Normal caliber abdominal aorta. Scattered atherosclerotic calcifications. No dissection. Celiac: Normal SMA: Normal Renals: Normal IMA: Patent Inflow: Normal Proximal Outflow: Normal Veins: Normal  Review of the MIP images confirms the above findings. NON-VASCULAR Lower chest: Small left pleural effusion and bibasilar atelectasis. The heart is normal in size. No pericardial effusion. There is a moderate to large hiatal hernia.  Hepatobiliary: No hepatic lesions or intrahepatic biliary dilatation. The gallbladder is unremarkable. No common bile duct dilatation. Pancreas: No mass, inflammation or ductal dilatation. Spleen: Massive splenomegaly. The spleen measures 20 x 16 x 11 cm. No splenic lesions or splenic infarct. Adrenals/Urinary Tract: The left kidney is displaced medially and inferiorly by the enlarged spleen. No worrisome renal lesions,, hydronephrosis or pyelonephritis. There is a lower pole right renal calculus and there are 2 distal left ureteral calculi more proximal calculus measures 4 mm and the more distal calculus measures 5 mm. I do not see any hydroureter or hydronephrosis. Stomach/Bowel: The stomach, duodenum, small bowel and colon are grossly normal. No inflammatory changes, mass lesions or obstructive findings. Lymphatic: No abdominal or pelvic lymphadenopathy. Reproductive: The prostate gland is enlarged. The seminal vesicles are unremarkable. Other: No pelvic mass or adenopathy. No free pelvic fluid collections. No inguinal mass or adenopathy. No abdominal wall hernia or subcutaneous lesions. Musculoskeletal: No significant bony findings. IMPRESSION: 1. Normal caliber abdominal aorta and no dissection. The branch vessels are normal. 2. Splenomegaly. 3. Two distal left ureteral calculi but no hydroureter or hydronephrosis. 4. Lower pole right renal calculus. 5. Moderate to large hiatal hernia. 6. Small left pleural effusion and bibasilar atelectasis. Electronically Signed   By: Marrian Siva M.D.   On: 08/31/2022 18:13     ASSESSMENT & PLAN:   78 y.o. male with   #1 JAK2-positive myeloproliferative neoplasm-primarily presenting with thrombocytosis and associated MDS causing refractory anemia -No polycythemia.  -Mild leukocytosis.  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 Has grade 1 out of 3 reticulin fibrosis.  Uncertain if this is primary or secondary. -LDH has  remained within normal limits. 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 -CT Angio GI bleed scan from 09/01/2022 showed kidney stones.and significant splenomegaly  PLAN:  -Discussed lab results on 02/10/24 in detail with patient. CBC showed WBC of 6.1K, hemoglobin of 6.9, and platelets of 135K. -hgb 6.9 -proceed with 1 unit PRBCs today -Platelets and WBC normal -discussed that there is concern for iron  overload from recurrent blood transfusions. With recurrent transfusions, there can be issues including liver problems, fatigue, heart issues, or joint issues from iron  overload -recommend jadenu iron  chelation therapy to remove iron . This can cause GI side effects such as diarrhea or kidney/liver side effects.This medication would be started at a low dose. Discussed that Jadenu generally has less suppression effects on blod counts compared to other types iron  chelating therapies.  -to reduce additional iron , I would recommend cutting alcohol use and avoiding high amounts of red meat in the diet, though his high iron  is mainly from blood transfusions -advised patient to avoid iron  supplements -educated patient that ferritin is the storage form of iron  in the body -updated iron  labs with Dr. Corinne Dicker last month showed high ferritin in 3000s which is a function of transfusion and can cause loss of libido or fatigue -discussed main consideration of ensuring that there are no drug interactions between Viagra and his any of his other medications and ensuring that his heart can take it. Also discussed that viagra can cause vision changes.  -recommend proceeding with upcoming eye evaluation with ophthalmologist prior to considerations of viagra due to possible vision concerns -recommend connecting with PCP, Dr. Donalynn Fry, and discussing  loss of libido to discuss potential risk and benefits of viagra  -reviewed labs from February, which showed testosterone  746  -Patient has tolerated Vonjo  200 mg po BID with no major  toxicity issues, and he shall continue this dose as this time  -his spleen appeared to be stable if not smaller in size since his last visit based on physical examination -will plan to repeat his scan in the next few months to evaluate how much his spleen has shrunk in size -continue to follow-up with Dr Corinne Dicker @ [facility]  -discussed option to try OTC artificial tears to address vision issues -proceed with upcoming visit with eye doctor for evaluation of vision issues -recommend hydrating well to improve blood pressure and electrolyte management  -answered all of patient's questions in detail.   Follow-up: Weekly labs and PRBC transfusion x 12 appointments  MD visit in 6 weeks  The total time spent in the appointment was 32 minutes* .  All of the patient's questions were answered with apparent satisfaction. The patient knows to call the clinic with any problems, questions or concerns.   Jacquelyn Matt MD MS AAHIVMS [facility] Hematology/Oncology Physician [facility]  .*Total Encounter Time as defined by the Centers for Medicare and Medicaid Services includes, in addition to the face-to-face time of a patient visit (documented in the note above) non-face-to-face time: obtaining and reviewing outside history, ordering and reviewing medications, tests or procedures, care coordination (communications with other health care professionals or caregivers) and documentation in the medical record.    I,Mitra Faeizi,acting as a Neurosurgeon for Jacquelyn Matt, MD.,have documented all relevant documentation on the behalf of Jacquelyn Matt, MD,as directed by  Jacquelyn Matt, MD while in the presence of Jacquelyn Matt, MD.  I have reviewed the above documentation for accuracy and completeness, and I agree with the above. .Shaquanda Graves Kishore Artrice Kraker MD

## 2024-02-10 NOTE — Telephone Encounter (Signed)
 Received telephone call from [facility] in the lab reporting critical lab value on patient. Hbg 6.9. blood bank sent Notified Dr. Salomon Cree

## 2024-02-13 LAB — TYPE AND SCREEN
ABO/RH(D): A POS
Antibody Screen: NEGATIVE
Unit division: 0

## 2024-02-13 LAB — BPAM RBC
Blood Product Expiration Date: 202505252359
ISSUE DATE / TIME: 202504251324
Unit Type and Rh: 6200

## 2024-02-15 ENCOUNTER — Other Ambulatory Visit: Payer: Self-pay

## 2024-02-15 DIAGNOSIS — D469 Myelodysplastic syndrome, unspecified: Secondary | ICD-10-CM

## 2024-02-16 ENCOUNTER — Inpatient Hospital Stay: Payer: Self-pay | Attending: Hematology

## 2024-02-16 ENCOUNTER — Inpatient Hospital Stay: Payer: Self-pay

## 2024-02-16 DIAGNOSIS — D464 Refractory anemia, unspecified: Secondary | ICD-10-CM | POA: Diagnosis not present

## 2024-02-16 DIAGNOSIS — Z79899 Other long term (current) drug therapy: Secondary | ICD-10-CM | POA: Diagnosis not present

## 2024-02-16 DIAGNOSIS — D469 Myelodysplastic syndrome, unspecified: Secondary | ICD-10-CM

## 2024-02-16 LAB — CBC WITH DIFFERENTIAL (CANCER CENTER ONLY)
Abs Immature Granulocytes: 0.21 10*3/uL — ABNORMAL HIGH (ref 0.00–0.07)
Basophils Absolute: 0.2 10*3/uL — ABNORMAL HIGH (ref 0.0–0.1)
Basophils Relative: 4 %
Eosinophils Absolute: 0.3 10*3/uL (ref 0.0–0.5)
Eosinophils Relative: 5 %
HCT: 24.6 % — ABNORMAL LOW (ref 39.0–52.0)
Hemoglobin: 8.2 g/dL — ABNORMAL LOW (ref 13.0–17.0)
Immature Granulocytes: 4 %
Lymphocytes Relative: 9 %
Lymphs Abs: 0.5 10*3/uL — ABNORMAL LOW (ref 0.7–4.0)
MCH: 31.5 pg (ref 26.0–34.0)
MCHC: 33.3 g/dL (ref 30.0–36.0)
MCV: 94.6 fL (ref 80.0–100.0)
Monocytes Absolute: 0.5 10*3/uL (ref 0.1–1.0)
Monocytes Relative: 8 %
Neutro Abs: 4 10*3/uL (ref 1.7–7.7)
Neutrophils Relative %: 70 %
Platelet Count: 145 10*3/uL — ABNORMAL LOW (ref 150–400)
RBC: 2.6 MIL/uL — ABNORMAL LOW (ref 4.22–5.81)
RDW: 21.1 % — ABNORMAL HIGH (ref 11.5–15.5)
WBC Count: 5.7 10*3/uL (ref 4.0–10.5)
nRBC: 0 % (ref 0.0–0.2)

## 2024-02-16 LAB — SAMPLE TO BLOOD BANK

## 2024-02-16 LAB — TYPE AND SCREEN
ABO/RH(D): A POS
Antibody Screen: NEGATIVE

## 2024-02-16 MED ORDER — SODIUM CHLORIDE 0.9% FLUSH
10.0000 mL | Freq: Once | INTRAVENOUS | Status: AC
  Administered 2024-02-16: 10 mL

## 2024-02-16 MED ORDER — HEPARIN SOD (PORK) LOCK FLUSH 100 UNIT/ML IV SOLN
500.0000 [IU] | Freq: Once | INTRAVENOUS | Status: AC
  Administered 2024-02-16: 500 [IU]

## 2024-02-16 NOTE — Progress Notes (Signed)
 Per parameters, patient not to receive blood today for Hgb 8.2.  Patient reports feeling "OK," with no changes.

## 2024-02-17 ENCOUNTER — Encounter: Payer: Self-pay | Admitting: Hematology

## 2024-02-17 DIAGNOSIS — E782 Mixed hyperlipidemia: Secondary | ICD-10-CM | POA: Diagnosis not present

## 2024-02-17 DIAGNOSIS — E119 Type 2 diabetes mellitus without complications: Secondary | ICD-10-CM | POA: Diagnosis not present

## 2024-02-23 ENCOUNTER — Inpatient Hospital Stay: Payer: Self-pay

## 2024-02-23 ENCOUNTER — Other Ambulatory Visit: Payer: Self-pay | Admitting: Physician Assistant

## 2024-02-23 ENCOUNTER — Other Ambulatory Visit: Payer: Self-pay

## 2024-02-23 VITALS — BP 110/59 | HR 63 | Temp 97.8°F | Resp 17

## 2024-02-23 DIAGNOSIS — D471 Chronic myeloproliferative disease: Secondary | ICD-10-CM

## 2024-02-23 DIAGNOSIS — D469 Myelodysplastic syndrome, unspecified: Secondary | ICD-10-CM

## 2024-02-23 DIAGNOSIS — D464 Refractory anemia, unspecified: Secondary | ICD-10-CM | POA: Diagnosis not present

## 2024-02-23 DIAGNOSIS — Z79899 Other long term (current) drug therapy: Secondary | ICD-10-CM | POA: Diagnosis not present

## 2024-02-23 LAB — CBC WITH DIFFERENTIAL (CANCER CENTER ONLY)
Abs Immature Granulocytes: 0.12 10*3/uL — ABNORMAL HIGH (ref 0.00–0.07)
Basophils Absolute: 0.2 10*3/uL — ABNORMAL HIGH (ref 0.0–0.1)
Basophils Relative: 3 %
Eosinophils Absolute: 0.3 10*3/uL (ref 0.0–0.5)
Eosinophils Relative: 5 %
HCT: 21.4 % — ABNORMAL LOW (ref 39.0–52.0)
Hemoglobin: 7.1 g/dL — ABNORMAL LOW (ref 13.0–17.0)
Immature Granulocytes: 2 %
Lymphocytes Relative: 9 %
Lymphs Abs: 0.5 10*3/uL — ABNORMAL LOW (ref 0.7–4.0)
MCH: 31.1 pg (ref 26.0–34.0)
MCHC: 33.2 g/dL (ref 30.0–36.0)
MCV: 93.9 fL (ref 80.0–100.0)
Monocytes Absolute: 0.5 10*3/uL (ref 0.1–1.0)
Monocytes Relative: 9 %
Neutro Abs: 4 10*3/uL (ref 1.7–7.7)
Neutrophils Relative %: 72 %
Platelet Count: 141 10*3/uL — ABNORMAL LOW (ref 150–400)
RBC: 2.28 MIL/uL — ABNORMAL LOW (ref 4.22–5.81)
RDW: 21.3 % — ABNORMAL HIGH (ref 11.5–15.5)
WBC Count: 5.6 10*3/uL (ref 4.0–10.5)
nRBC: 0 % (ref 0.0–0.2)

## 2024-02-23 LAB — PREPARE RBC (CROSSMATCH)

## 2024-02-23 LAB — SAMPLE TO BLOOD BANK

## 2024-02-23 MED ORDER — SODIUM CHLORIDE 0.9% IV SOLUTION
250.0000 mL | INTRAVENOUS | Status: DC
  Administered 2024-02-23: 100 mL via INTRAVENOUS

## 2024-02-23 MED ORDER — HEPARIN SOD (PORK) LOCK FLUSH 100 UNIT/ML IV SOLN
500.0000 [IU] | Freq: Once | INTRAVENOUS | Status: AC
  Administered 2024-02-23: 500 [IU]

## 2024-02-23 MED ORDER — ACETAMINOPHEN 325 MG PO TABS
650.0000 mg | ORAL_TABLET | Freq: Once | ORAL | Status: AC
  Administered 2024-02-23: 650 mg via ORAL
  Filled 2024-02-23: qty 2

## 2024-02-23 MED ORDER — SODIUM CHLORIDE 0.9% FLUSH
10.0000 mL | Freq: Once | INTRAVENOUS | Status: AC
Start: 2024-02-23 — End: 2024-02-23
  Administered 2024-02-23: 10 mL

## 2024-02-23 MED ORDER — METHYLPREDNISOLONE SODIUM SUCC 40 MG IJ SOLR
40.0000 mg | Freq: Once | INTRAMUSCULAR | Status: AC
Start: 2024-02-23 — End: 2024-02-23
  Administered 2024-02-23: 40 mg via INTRAVENOUS
  Filled 2024-02-23: qty 1

## 2024-02-23 NOTE — Patient Instructions (Signed)
 Blood Transfusion, Adult A blood transfusion is a procedure in which you receive blood or a type of blood cell (blood component) through an IV. You may need a blood transfusion when you have a low blood count, which is a low number of any blood cell. This may result from a bleeding disorder, illness, injury, or surgery. The blood may come from a donor, or you may be able to have your own blood collected and stored (autologous blood donation) before a planned surgery. The blood given in a transfusion may be made up of different blood components. You may receive: Red blood cells. These carry oxygen to the cells in the body. Platelets. These help your blood to clot. Plasma. This is the liquid part of your blood. It carries proteins and other substances throughout the body. White blood cells. These help you fight infections. If you have hemophilia or another clotting disorder, you may also receive other types of blood products. Depending on the type of blood product, this procedure may take 1-4 hours to complete. Tell a health care provider about: Any bleeding problems you have. Any previous reactions you have had during a blood transfusion. Any allergies you have. All medicines you are taking, including vitamins, herbs, eye drops, creams, and over-the-counter medicines. Any surgeries you have had. Any medical conditions you have. Whether you are pregnant or may be pregnant. What are the risks? Talk with your health care provider about risks. The most common problems include: A mild allergic reaction, such as red, swollen areas of skin (hives) and itching. Fever or chills. This may be the body's response to new blood cells received. This may occur during or up to 4 hours after the transfusion. More serious problems may include: A serious allergic reaction that causes difficulty breathing or swelling around the face and lips. Transfusion-associated circulatory overload (TACO), or too much fluid in  the lungs. This may cause breathing problems. Transfusion-related acute lung injury (TRALI), which causes breathing difficulty and low oxygen in the blood. This can occur within hours of the transfusion or several days later. Iron overload. This can happen after receiving many blood transfusions over a period of time. Infection or virus being transmitted. This is rare because donated blood is carefully tested before it is given. Hemolytic transfusion reaction. This is rare. It happens when the body's defense system (immune system)tries to attack the new blood cells. Symptoms may include fever, chills, nausea, low blood pressure, and low back or chest pain. Transfusion-associated graft-versus-host disease (TAGVHD). This is rare. It happens when donated cells attack the body's healthy tissues. What happens before the procedure? You will have a blood test to check your blood type. This test is done to know what kind of blood your body will accept and to match it to the donor blood. If you are going to have a planned surgery, you may be able to do an autologous blood donation. This may be done in case you need to have a transfusion. You will have your temperature, blood pressure, and pulse checked before the transfusion. If you have had an allergic reaction to a transfusion in the past, you may be given medicine to help prevent a reaction. This medicine may be given to you by mouth (orally) or through an IV. What happens during the procedure?  An IV will be inserted into one of your veins. The bag of blood will be attached to your IV. The blood will then enter through your vein. Your temperature, blood pressure,  and pulse will be monitored during the transfusion. This monitoring is done to detect early signs of a transfusion reaction. Tell your nurse right away if you have any of these symptoms during the transfusion: Shortness of breath or trouble breathing. Chest or back pain. Fever or  chills. Itching or hives. If you have any signs or symptoms of a reaction, your transfusion will be stopped and you may be given medicine. When the transfusion is complete, your IV will be removed. Pressure may be applied to the IV site for a few minutes. A bandage (dressing)will be applied. The procedure may vary among health care providers and hospitals. What happens after the procedure? Your temperature, blood pressure, pulse, breathing rate, and blood oxygen level will be monitored until you leave the hospital or clinic. Your blood may be tested to see how you have responded to the transfusion. You may be warmed with fluids or blankets to maintain a normal body temperature. If you receive your blood transfusion in an outpatient setting, you will be told whom to contact to report any reactions. Where to find more information Visit the [employer]: redcross.org Summary A blood transfusion is a procedure in which you receive blood or a type of blood cell (blood component) through an IV. The blood given in a transfusion may be made up of different blood components. You may receive red blood cells, platelets, plasma, or white blood cells depending on the condition treated. Your temperature, blood pressure, and pulse will be monitored before, during, and after the transfusion. After the transfusion, your blood may be tested to see how your body has responded. This information is not intended to replace advice given to you by your health care provider. Make sure you discuss any questions you have with your health care provider. Document Revised: 01/01/2022 Document Reviewed: 01/01/2022 Elsevier Patient Education  2024 [employer].

## 2024-02-24 LAB — TYPE AND SCREEN
ABO/RH(D): A POS
Antibody Screen: NEGATIVE
Unit division: 0

## 2024-02-24 LAB — BPAM RBC
Blood Product Expiration Date: 202505102359
ISSUE DATE / TIME: 202505080938
Unit Type and Rh: 600

## 2024-02-25 ENCOUNTER — Other Ambulatory Visit: Payer: Self-pay | Admitting: Hematology

## 2024-02-27 ENCOUNTER — Encounter: Payer: Self-pay | Admitting: Hematology

## 2024-02-27 ENCOUNTER — Other Ambulatory Visit: Payer: Self-pay

## 2024-03-01 ENCOUNTER — Inpatient Hospital Stay: Payer: Self-pay

## 2024-03-01 ENCOUNTER — Other Ambulatory Visit: Payer: Self-pay

## 2024-03-01 DIAGNOSIS — D469 Myelodysplastic syndrome, unspecified: Secondary | ICD-10-CM

## 2024-03-01 DIAGNOSIS — D464 Refractory anemia, unspecified: Secondary | ICD-10-CM | POA: Diagnosis not present

## 2024-03-01 DIAGNOSIS — D649 Anemia, unspecified: Secondary | ICD-10-CM

## 2024-03-01 DIAGNOSIS — Z79899 Other long term (current) drug therapy: Secondary | ICD-10-CM | POA: Diagnosis not present

## 2024-03-01 LAB — CBC WITH DIFFERENTIAL (CANCER CENTER ONLY)
Abs Immature Granulocytes: 0.16 10*3/uL — ABNORMAL HIGH (ref 0.00–0.07)
Basophils Absolute: 0.2 10*3/uL — ABNORMAL HIGH (ref 0.0–0.1)
Basophils Relative: 2 %
Eosinophils Absolute: 0.4 10*3/uL (ref 0.0–0.5)
Eosinophils Relative: 5 %
HCT: 22.3 % — ABNORMAL LOW (ref 39.0–52.0)
Hemoglobin: 7.4 g/dL — ABNORMAL LOW (ref 13.0–17.0)
Immature Granulocytes: 2 %
Lymphocytes Relative: 8 %
Lymphs Abs: 0.5 10*3/uL — ABNORMAL LOW (ref 0.7–4.0)
MCH: 32 pg (ref 26.0–34.0)
MCHC: 33.2 g/dL (ref 30.0–36.0)
MCV: 96.5 fL (ref 80.0–100.0)
Monocytes Absolute: 0.6 10*3/uL (ref 0.1–1.0)
Monocytes Relative: 9 %
Neutro Abs: 5.1 10*3/uL (ref 1.7–7.7)
Neutrophils Relative %: 74 %
Platelet Count: 146 10*3/uL — ABNORMAL LOW (ref 150–400)
RBC: 2.31 MIL/uL — ABNORMAL LOW (ref 4.22–5.81)
RDW: 21.5 % — ABNORMAL HIGH (ref 11.5–15.5)
WBC Count: 6.9 10*3/uL (ref 4.0–10.5)
nRBC: 0 % (ref 0.0–0.2)

## 2024-03-01 LAB — PREPARE RBC (CROSSMATCH)

## 2024-03-01 LAB — SAMPLE TO BLOOD BANK

## 2024-03-01 MED ORDER — HEPARIN SOD (PORK) LOCK FLUSH 100 UNIT/ML IV SOLN
500.0000 [IU] | Freq: Every day | INTRAVENOUS | Status: AC | PRN
  Administered 2024-03-01: 500 [IU]

## 2024-03-01 MED ORDER — SODIUM CHLORIDE 0.9% FLUSH
10.0000 mL | INTRAVENOUS | Status: AC | PRN
Start: 2024-03-01 — End: 2024-03-01
  Administered 2024-03-01: 10 mL

## 2024-03-01 MED ORDER — METHYLPREDNISOLONE SODIUM SUCC 40 MG IJ SOLR
40.0000 mg | Freq: Once | INTRAMUSCULAR | Status: AC
  Administered 2024-03-01: 40 mg via INTRAVENOUS
  Filled 2024-03-01: qty 1

## 2024-03-01 MED ORDER — SODIUM CHLORIDE 0.9% FLUSH
10.0000 mL | Freq: Once | INTRAVENOUS | Status: AC
  Administered 2024-03-01: 10 mL

## 2024-03-01 MED ORDER — ACETAMINOPHEN 325 MG PO TABS
650.0000 mg | ORAL_TABLET | Freq: Once | ORAL | Status: AC
  Administered 2024-03-01: 650 mg via ORAL
  Filled 2024-03-01: qty 2

## 2024-03-01 MED ORDER — SODIUM CHLORIDE 0.9% IV SOLUTION
250.0000 mL | INTRAVENOUS | Status: DC
  Administered 2024-03-01: 100 mL via INTRAVENOUS

## 2024-03-01 NOTE — Patient Instructions (Signed)

## 2024-03-02 LAB — TYPE AND SCREEN
ABO/RH(D): A POS
Antibody Screen: NEGATIVE
Unit division: 0

## 2024-03-02 LAB — BPAM RBC
Blood Product Expiration Date: 202506112359
ISSUE DATE / TIME: 202505150936
Unit Type and Rh: 6200

## 2024-03-06 ENCOUNTER — Other Ambulatory Visit: Payer: Self-pay

## 2024-03-07 ENCOUNTER — Other Ambulatory Visit: Payer: Self-pay

## 2024-03-07 DIAGNOSIS — D469 Myelodysplastic syndrome, unspecified: Secondary | ICD-10-CM

## 2024-03-08 ENCOUNTER — Inpatient Hospital Stay: Payer: Self-pay

## 2024-03-08 ENCOUNTER — Other Ambulatory Visit: Payer: Self-pay

## 2024-03-08 ENCOUNTER — Other Ambulatory Visit: Payer: Self-pay | Admitting: Pharmacy Technician

## 2024-03-08 ENCOUNTER — Other Ambulatory Visit (HOSPITAL_COMMUNITY): Payer: Self-pay

## 2024-03-08 VITALS — BP 108/53 | HR 59 | Temp 97.6°F | Resp 18 | Wt 160.5 lb

## 2024-03-08 DIAGNOSIS — D469 Myelodysplastic syndrome, unspecified: Secondary | ICD-10-CM

## 2024-03-08 DIAGNOSIS — D649 Anemia, unspecified: Secondary | ICD-10-CM

## 2024-03-08 DIAGNOSIS — D464 Refractory anemia, unspecified: Secondary | ICD-10-CM | POA: Diagnosis not present

## 2024-03-08 DIAGNOSIS — Z79899 Other long term (current) drug therapy: Secondary | ICD-10-CM | POA: Diagnosis not present

## 2024-03-08 LAB — CBC WITH DIFFERENTIAL (CANCER CENTER ONLY)
Abs Immature Granulocytes: 0.19 10*3/uL — ABNORMAL HIGH (ref 0.00–0.07)
Basophils Absolute: 0.2 10*3/uL — ABNORMAL HIGH (ref 0.0–0.1)
Basophils Relative: 3 %
Eosinophils Absolute: 0.3 10*3/uL (ref 0.0–0.5)
Eosinophils Relative: 6 %
HCT: 23.3 % — ABNORMAL LOW (ref 39.0–52.0)
Hemoglobin: 7.9 g/dL — ABNORMAL LOW (ref 13.0–17.0)
Immature Granulocytes: 4 %
Lymphocytes Relative: 9 %
Lymphs Abs: 0.4 10*3/uL — ABNORMAL LOW (ref 0.7–4.0)
MCH: 33.6 pg (ref 26.0–34.0)
MCHC: 33.9 g/dL (ref 30.0–36.0)
MCV: 99.1 fL (ref 80.0–100.0)
Monocytes Absolute: 0.4 10*3/uL (ref 0.1–1.0)
Monocytes Relative: 9 %
Neutro Abs: 3.6 10*3/uL (ref 1.7–7.7)
Neutrophils Relative %: 69 %
Platelet Count: 143 10*3/uL — ABNORMAL LOW (ref 150–400)
RBC: 2.35 MIL/uL — ABNORMAL LOW (ref 4.22–5.81)
RDW: 22.2 % — ABNORMAL HIGH (ref 11.5–15.5)
WBC Count: 5.1 10*3/uL (ref 4.0–10.5)
nRBC: 0 % (ref 0.0–0.2)

## 2024-03-08 LAB — PREPARE RBC (CROSSMATCH)

## 2024-03-08 LAB — SAMPLE TO BLOOD BANK

## 2024-03-08 MED ORDER — SODIUM CHLORIDE 0.9% IV SOLUTION
250.0000 mL | INTRAVENOUS | Status: DC
Start: 2024-03-08 — End: 2024-03-08
  Administered 2024-03-08: 100 mL via INTRAVENOUS

## 2024-03-08 MED ORDER — SODIUM CHLORIDE 0.9% FLUSH
10.0000 mL | Freq: Once | INTRAVENOUS | Status: AC
  Administered 2024-03-08: 10 mL

## 2024-03-08 MED ORDER — SODIUM CHLORIDE 0.9% FLUSH
3.0000 mL | INTRAVENOUS | Status: AC | PRN
Start: 2024-03-08 — End: 2024-03-08

## 2024-03-08 MED ORDER — METHYLPREDNISOLONE SODIUM SUCC 40 MG IJ SOLR
40.0000 mg | Freq: Once | INTRAMUSCULAR | Status: AC
Start: 2024-03-08 — End: 2024-03-08
  Administered 2024-03-08: 40 mg via INTRAVENOUS
  Filled 2024-03-08: qty 1

## 2024-03-08 MED ORDER — HEPARIN SOD (PORK) LOCK FLUSH 100 UNIT/ML IV SOLN
250.0000 [IU] | Freq: Once | INTRAVENOUS | Status: AC
  Administered 2024-03-08: 500 [IU]

## 2024-03-08 MED ORDER — ACETAMINOPHEN 325 MG PO TABS
650.0000 mg | ORAL_TABLET | Freq: Once | ORAL | Status: AC
  Administered 2024-03-08: 650 mg via ORAL
  Filled 2024-03-08: qty 2

## 2024-03-08 NOTE — Progress Notes (Signed)
 Specialty Pharmacy Refill Coordination Note  Fernando Lee is a 78 y.o. male contacted today regarding refills of specialty medication(s) Pacritinib  Citrate ([place] )   Patient requested [name] at [facility] at [place] date: 03/14/24   Medication will be filled on 03/13/24.

## 2024-03-08 NOTE — Progress Notes (Signed)
 Hgb  today 7.9 and patient C/O feeling weaker than usual. Provider notified. Ordered to given 1 unit of PRBC today. Patient updated.

## 2024-03-09 LAB — BPAM RBC
Blood Product Expiration Date: 202506192359
ISSUE DATE / TIME: 202505221015
Unit Type and Rh: 6200

## 2024-03-09 LAB — TYPE AND SCREEN
ABO/RH(D): A POS
Antibody Screen: NEGATIVE
Unit division: 0

## 2024-03-14 ENCOUNTER — Other Ambulatory Visit: Payer: Self-pay

## 2024-03-14 DIAGNOSIS — D469 Myelodysplastic syndrome, unspecified: Secondary | ICD-10-CM

## 2024-03-15 ENCOUNTER — Inpatient Hospital Stay: Payer: Self-pay

## 2024-03-15 VITALS — BP 120/60 | HR 63 | Temp 97.6°F | Resp 18

## 2024-03-15 DIAGNOSIS — D469 Myelodysplastic syndrome, unspecified: Secondary | ICD-10-CM

## 2024-03-15 DIAGNOSIS — Z79899 Other long term (current) drug therapy: Secondary | ICD-10-CM | POA: Diagnosis not present

## 2024-03-15 DIAGNOSIS — D464 Refractory anemia, unspecified: Secondary | ICD-10-CM | POA: Diagnosis not present

## 2024-03-15 LAB — CBC WITH DIFFERENTIAL (CANCER CENTER ONLY)
Abs Immature Granulocytes: 0.18 10*3/uL — ABNORMAL HIGH (ref 0.00–0.07)
Basophils Absolute: 0.2 10*3/uL — ABNORMAL HIGH (ref 0.0–0.1)
Basophils Relative: 3 %
Eosinophils Absolute: 0.3 10*3/uL (ref 0.0–0.5)
Eosinophils Relative: 6 %
HCT: 24.6 % — ABNORMAL LOW (ref 39.0–52.0)
Hemoglobin: 8.2 g/dL — ABNORMAL LOW (ref 13.0–17.0)
Immature Granulocytes: 3 %
Lymphocytes Relative: 8 %
Lymphs Abs: 0.5 10*3/uL — ABNORMAL LOW (ref 0.7–4.0)
MCH: 32.9 pg (ref 26.0–34.0)
MCHC: 33.3 g/dL (ref 30.0–36.0)
MCV: 98.8 fL (ref 80.0–100.0)
Monocytes Absolute: 0.4 10*3/uL (ref 0.1–1.0)
Monocytes Relative: 7 %
Neutro Abs: 3.9 10*3/uL (ref 1.7–7.7)
Neutrophils Relative %: 73 %
Platelet Count: 174 10*3/uL (ref 150–400)
RBC: 2.49 MIL/uL — ABNORMAL LOW (ref 4.22–5.81)
RDW: 21.9 % — ABNORMAL HIGH (ref 11.5–15.5)
WBC Count: 5.4 10*3/uL (ref 4.0–10.5)
nRBC: 0 % (ref 0.0–0.2)

## 2024-03-15 LAB — SAMPLE TO BLOOD BANK

## 2024-03-15 LAB — TYPE AND SCREEN
ABO/RH(D): A POS
Antibody Screen: NEGATIVE

## 2024-03-15 MED ORDER — HEPARIN SOD (PORK) LOCK FLUSH 100 UNIT/ML IV SOLN
250.0000 [IU] | Freq: Once | INTRAVENOUS | Status: AC
  Administered 2024-03-15: 250 [IU]

## 2024-03-15 MED ORDER — SODIUM CHLORIDE 0.9% FLUSH
10.0000 mL | Freq: Once | INTRAVENOUS | Status: AC
  Administered 2024-03-15: 10 mL

## 2024-03-19 ENCOUNTER — Other Ambulatory Visit (HOSPITAL_COMMUNITY): Payer: Self-pay

## 2024-03-20 ENCOUNTER — Telehealth: Payer: Self-pay | Admitting: Pharmacy Technician

## 2024-03-20 NOTE — Telephone Encounter (Signed)
 Oral Oncology Patient Advocate Encounter  Pt called after receiving a letter that his grant was about to be closed. Advised him that he didn't need to do anything at this time. His out of pocket has been met with his insurance and they are covering his medication at this time and should for the rest of the year. We will reapply when/if the grant reopens at the end of the year, for next year, if the pt is still on the same medication.   Pt and his wife understood and were grateful!  Roda Cirri, CPhT Specialty Pharmacy Patient Advocate Phone: [phone] Fax: [phone]

## 2024-03-21 ENCOUNTER — Other Ambulatory Visit: Payer: Self-pay

## 2024-03-21 DIAGNOSIS — D469 Myelodysplastic syndrome, unspecified: Secondary | ICD-10-CM

## 2024-03-22 ENCOUNTER — Other Ambulatory Visit: Payer: Self-pay

## 2024-03-22 ENCOUNTER — Inpatient Hospital Stay

## 2024-03-22 ENCOUNTER — Inpatient Hospital Stay: Attending: Hematology

## 2024-03-22 DIAGNOSIS — D464 Refractory anemia, unspecified: Secondary | ICD-10-CM | POA: Insufficient documentation

## 2024-03-22 DIAGNOSIS — D469 Myelodysplastic syndrome, unspecified: Secondary | ICD-10-CM

## 2024-03-22 DIAGNOSIS — Z79624 Long term (current) use of inhibitors of nucleotide synthesis: Secondary | ICD-10-CM | POA: Insufficient documentation

## 2024-03-22 DIAGNOSIS — Z87891 Personal history of nicotine dependence: Secondary | ICD-10-CM | POA: Diagnosis not present

## 2024-03-22 DIAGNOSIS — E782 Mixed hyperlipidemia: Secondary | ICD-10-CM | POA: Diagnosis not present

## 2024-03-22 DIAGNOSIS — Z79899 Other long term (current) drug therapy: Secondary | ICD-10-CM | POA: Insufficient documentation

## 2024-03-22 DIAGNOSIS — D72829 Elevated white blood cell count, unspecified: Secondary | ICD-10-CM | POA: Insufficient documentation

## 2024-03-22 DIAGNOSIS — R161 Splenomegaly, not elsewhere classified: Secondary | ICD-10-CM | POA: Diagnosis not present

## 2024-03-22 DIAGNOSIS — D75839 Thrombocytosis, unspecified: Secondary | ICD-10-CM | POA: Insufficient documentation

## 2024-03-22 DIAGNOSIS — D649 Anemia, unspecified: Secondary | ICD-10-CM

## 2024-03-22 DIAGNOSIS — J90 Pleural effusion, not elsewhere classified: Secondary | ICD-10-CM | POA: Insufficient documentation

## 2024-03-22 DIAGNOSIS — N202 Calculus of kidney with calculus of ureter: Secondary | ICD-10-CM | POA: Insufficient documentation

## 2024-03-22 DIAGNOSIS — K449 Diaphragmatic hernia without obstruction or gangrene: Secondary | ICD-10-CM | POA: Diagnosis not present

## 2024-03-22 DIAGNOSIS — Z860100 Personal history of colon polyps, unspecified: Secondary | ICD-10-CM | POA: Diagnosis not present

## 2024-03-22 DIAGNOSIS — D539 Nutritional anemia, unspecified: Secondary | ICD-10-CM | POA: Diagnosis not present

## 2024-03-22 DIAGNOSIS — Z8719 Personal history of other diseases of the digestive system: Secondary | ICD-10-CM | POA: Diagnosis not present

## 2024-03-22 DIAGNOSIS — I7090 Unspecified atherosclerosis: Secondary | ICD-10-CM | POA: Insufficient documentation

## 2024-03-22 LAB — CBC WITH DIFFERENTIAL (CANCER CENTER ONLY)
Abs Immature Granulocytes: 0.11 10*3/uL — ABNORMAL HIGH (ref 0.00–0.07)
Basophils Absolute: 0.2 10*3/uL — ABNORMAL HIGH (ref 0.0–0.1)
Basophils Relative: 3 %
Eosinophils Absolute: 0.3 10*3/uL (ref 0.0–0.5)
Eosinophils Relative: 5 %
HCT: 22.9 % — ABNORMAL LOW (ref 39.0–52.0)
Hemoglobin: 7.7 g/dL — ABNORMAL LOW (ref 13.0–17.0)
Immature Granulocytes: 2 %
Lymphocytes Relative: 10 %
Lymphs Abs: 0.6 10*3/uL — ABNORMAL LOW (ref 0.7–4.0)
MCH: 32.8 pg (ref 26.0–34.0)
MCHC: 33.6 g/dL (ref 30.0–36.0)
MCV: 97.4 fL (ref 80.0–100.0)
Monocytes Absolute: 0.5 10*3/uL (ref 0.1–1.0)
Monocytes Relative: 9 %
Neutro Abs: 4 10*3/uL (ref 1.7–7.7)
Neutrophils Relative %: 71 %
Platelet Count: 147 10*3/uL — ABNORMAL LOW (ref 150–400)
RBC: 2.35 MIL/uL — ABNORMAL LOW (ref 4.22–5.81)
RDW: 22.9 % — ABNORMAL HIGH (ref 11.5–15.5)
WBC Count: 5.8 10*3/uL (ref 4.0–10.5)
nRBC: 0 % (ref 0.0–0.2)

## 2024-03-22 LAB — SAMPLE TO BLOOD BANK

## 2024-03-22 LAB — PREPARE RBC (CROSSMATCH)

## 2024-03-22 MED ORDER — ACETAMINOPHEN 325 MG PO TABS
650.0000 mg | ORAL_TABLET | Freq: Once | ORAL | Status: AC
  Administered 2024-03-22: 650 mg via ORAL
  Filled 2024-03-22: qty 2

## 2024-03-22 MED ORDER — METHYLPREDNISOLONE SODIUM SUCC 40 MG IJ SOLR
40.0000 mg | Freq: Once | INTRAMUSCULAR | Status: AC
  Administered 2024-03-22: 40 mg via INTRAVENOUS
  Filled 2024-03-22: qty 1

## 2024-03-22 MED ORDER — SODIUM CHLORIDE 0.9% IV SOLUTION
250.0000 mL | INTRAVENOUS | Status: DC
  Administered 2024-03-22: 100 mL via INTRAVENOUS

## 2024-03-22 MED ORDER — SODIUM CHLORIDE 0.9% FLUSH
10.0000 mL | Freq: Once | INTRAVENOUS | Status: AC
  Administered 2024-03-22: 10 mL

## 2024-03-22 NOTE — Patient Instructions (Signed)

## 2024-03-22 NOTE — Progress Notes (Signed)
 Hgb 7.7, patient symptomatic and requesting blood transfusion. 1 unit blood ordered per Salomon Cree, MD.

## 2024-03-23 LAB — TYPE AND SCREEN
ABO/RH(D): A POS
Antibody Screen: NEGATIVE
Unit division: 0

## 2024-03-23 LAB — BPAM RBC
Blood Product Expiration Date: 202507052359
ISSUE DATE / TIME: 202506051011
Unit Type and Rh: 6200

## 2024-03-25 ENCOUNTER — Other Ambulatory Visit: Payer: Self-pay | Admitting: Hematology

## 2024-03-26 ENCOUNTER — Encounter: Payer: Self-pay | Admitting: Hematology

## 2024-03-27 ENCOUNTER — Other Ambulatory Visit: Payer: Self-pay

## 2024-03-27 DIAGNOSIS — D469 Myelodysplastic syndrome, unspecified: Secondary | ICD-10-CM

## 2024-03-27 DIAGNOSIS — D649 Anemia, unspecified: Secondary | ICD-10-CM

## 2024-03-27 NOTE — Progress Notes (Signed)
 HEMATOLOGY/ONCOLOGY PROGRESS NOTE:   Date of Service: 03/28/2024  Patient Care Team: Fernando April, DO as PCP - General (Family Medicine)  CHIEF COMPLAINTS:  Follow-up for continued evaluation and management of JAK2 positive myeloproliferative neoplasm/MDS  INTERVAL HISTORY:  [name] is a 78 y.o. male here for continued evaluation and management of his MPN/MDS with refractory anemia and thrombocytosis.   Patient was last seen by me on 02/10/2024 and reported mild blurry vision in his left eye, abdominal pain sometimes in the ribs, loss of libido, and mild leg swelling.  Patient is accompanied by his wife during today's visit. He reports that he has felt fairly stable overall since his last clinical visit and denies any new concerns.  He reports that he has an upcoming appointment with Dr. Corinne Lee on Monday, 04/02/2024, and will have his iron  levels checked.  Patient reports that he has his port placed.   He denies any acute pain or abdominal pain.  Patient is tolerating Vonjo  200 mg po BID with no new or major toxicity issues.   He notes that he takes Colase which keeps his bowel movements regular.   Patient complains of some urinary issues and notes that he is needing to see a urologist. He notes that he has been diagnosed with an enlarged prostate in the past and is taking Alfuzosin .   Patient reports previous bladder pain which he attributes to drinking a fair amount of lemonade recently and not drinking much water. Patient denies any bladder pain at this time.   He notes that he was seen by his PCP recently but did not have his testosterone  levels tested.   He reports some increased personal stress in the last week including due to his dog's health issues.   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   Internal hemorrhoids    Kidney stones    Male erectile dysfunction, unspecified    Mixed dyslipidemia    Mixed hyperlipidemia    Ocular migraine    PAC (premature atrial contraction)    Personal history of colonic polyps    Prediabetes    Residual hemorrhoidal skin tags    Spleen enlarged    nov 2023 hospitalized   Unspecified hemorrhoids     SURGICAL HISTORY: Past Surgical History:  Procedure Laterality Date   BONE MARROW BIOPSY     IR IMAGING GUIDED PORT INSERTION  09/14/2022   KIDNEY STONE SURGERY     retrieval    SOCIAL HISTORY: Social History   Socioeconomic History   Marital status: Married    Spouse name: Not on file   Number of children: Not on file   Years of education: Not on file   Highest education level: Not on file  Occupational History   Not on file  Tobacco Use   Smoking status: Former    Types: Cigarettes   Smokeless tobacco: Never  Substance and Sexual Activity   Alcohol use: Yes    Alcohol/week: 1.0 standard drink of alcohol    Types: 1 Cans of beer per week  Drug use: Not on file   Sexual activity: Not on file  Other Topics Concern   Not on file  Social History Narrative   Not on file   Social Drivers of Health   Financial Resource Strain: Not on file  Food Insecurity: Patient Declined (09/16/2023)   Hunger Vital Sign    Worried About Running Out of Food in the Last Year: Patient declined    Ran Out of Food in the Last Year: Patient declined  Transportation Needs: Patient Declined (09/16/2023)   PRAPARE - Administrator, Civil Service (Medical): Patient declined    Lack of Transportation (Non-Medical): Patient declined  Physical Activity: Not on file  Stress: Not on file  Social Connections: Not on file  Intimate Partner Violence: Patient Declined (09/16/2023)    Humiliation, Afraid, Rape, and Kick questionnaire    Fear of Current or Ex-Partner: Patient declined    Emotionally Abused: Patient declined    Physically Abused: Patient declined    Sexually Abused: Patient declined    FAMILY HISTORY: Family History  Problem Relation Age of Onset   Stroke Brother    Congestive Heart Failure Brother     ALLERGIES:  has no known allergies.  MEDICATIONS:  Current Outpatient Medications  Medication Sig Dispense Refill   potassium chloride  SA (KLOR-CON  M) 20 MEQ tablet Take 1 tablet (20 mEq total) by mouth 2 (two) times daily. 30 tablet 2   acetaminophen  (TYLENOL ) 325 MG tablet Take 2 tablets (650 mg total) by mouth every 6 (six) hours as needed for mild pain (pain score 1-3) (or Fever >/= 101). 30 tablet 0   alfuzosin  (UROXATRAL ) 10 MG 24 hr tablet Take 10 mg by mouth daily with breakfast.     B Complex Vitamins (B COMPLEX PO) Take 1 tablet by mouth daily.     Cholecalciferol  (VITAMIN D3) 50 MCG (2000 UT) TABS Take 2,000 Units by mouth daily.     docusate sodium  (COLACE) 100 MG capsule Take 100 mg by mouth 2 (two) times daily.     fish oil-omega-3 fatty acids  1000 MG capsule Take 1 g by mouth daily.     furosemide  (LASIX ) 20 MG tablet Take 1 tablet by mouth once daily 30 tablet 0   GLUCOSAMINE CHONDROITIN MSM PO Take 1 tablet by mouth daily.     Multiple Vitamin (MULTIVITAMIN) tablet Take 1 tablet by mouth daily with breakfast.     pacritinib  citrate (VONJO ) 100 MG capsule Take 2 capsules (200 mg total) by mouth 2 (two) times daily. 120 capsule 2   Turmeric (QC TUMERIC COMPLEX PO) Take 750 mg by mouth daily.     valACYclovir  (VALTREX ) 500 MG tablet Take 1 tablet (500 mg total) by mouth 2 (two) times daily. 60 tablet 5   vitamin C  (ASCORBIC ACID ) 250 MG tablet Take 500 mg by mouth daily.     No current facility-administered medications for this visit.    REVIEW OF SYSTEMS:    10 Point review of Systems was done is negative except as noted above.    PHYSICAL EXAMINATION: .BP (!) 119/55   Pulse 65   Temp 97.9 F (36.6 C)   Resp 20   Wt 161 lb (73 kg)   SpO2 100%   BMI 25.22 kg/m    GENERAL:alert, in no acute distress and comfortable SKIN: no acute rashes, no significant lesions EYES: conjunctiva are pink and non-injected, sclera anicteric OROPHARYNX: MMM, no exudates, no oropharyngeal erythema or ulceration NECK: supple, no  JVD LYMPH:  no palpable lymphadenopathy in the cervical, axillary or inguinal regions LUNGS: clear to auscultation b/l with normal respiratory effort HEART: regular rate & rhythm ABDOMEN:  normoactive bowel sounds , non tender, not distended. Extremity: no pedal edema PSYCH: alert & oriented x 3 with fluent speech NEURO: no focal motor/sensory deficits   LABORATORY DATA:  I have reviewed the data as listed .    Latest Ref Rng & Units 03/28/2024    1:24 PM 03/22/2024    7:41 AM 03/15/2024    7:56 AM  CBC  WBC 4.0 - 10.5 K/uL 5.5  5.8  5.4   Hemoglobin 13.0 - 17.0 g/dL 7.9  7.7  8.2   Hematocrit 39.0 - 52.0 % 23.7  22.9  24.6   Platelets 150 - 400 K/uL 160  147  174      Iron /TIBC/Ferritin/ %Sat    Component Value Date/Time   IRON  178 03/28/2024 1446   TIBC 183 (L) 03/28/2024 1446   FERRITIN 5,406 (H) 03/28/2024 1446   IRONPCTSAT 97 (H) 03/28/2024 1446      Latest Ref Rng & Units 03/28/2024    2:46 PM 09/19/2023    3:49 AM 09/18/2023    3:30 AM  CMP  Glucose 70 - 99 mg/dL 086  578  469   BUN 8 - 23 mg/dL 21  16  19    Creatinine 0.61 - 1.24 mg/dL 6.29  5.28  4.13   Sodium 135 - 145 mmol/L 139  135  135   Potassium 3.5 - 5.1 mmol/L 4.4  4.1  3.8   Chloride 98 - 111 mmol/L 103  103  104   CO2 22 - 32 mmol/L 30  26  26    Calcium  8.9 - 10.3 mg/dL 8.9  8.5  8.3   Total Protein 6.5 - 8.1 g/dL 6.3     Total Bilirubin 0.0 - 1.2 mg/dL 0.9     Alkaline Phos 38 - 126 U/L 50     AST 15 - 41 U/L 19     ALT 0 - 44 U/L 42       Lab Results  Component Value Date   LDH 110 01/04/2022     02/23/2021 BCR ABL    02/23/2021 JAK2   Surgical Pathology  CASE: [id number]  PATIENT: Antwoine Biddinger  Bone Marrow Report  Clinical History: MPN with progressive anemia  (BH)  DIAGNOSIS:   BONE MARROW, ASPIRATE, CLOT, CORE:  -Hypercellular bone marrow with features of myeloid neoplasm  -See comment   PERIPHERAL BLOOD:  -Macrocytic anemia  -Leukocytosis  -Thrombocytosis   COMMENT:   The bone marrow/peripheral blood show persistent involvement by  previously known myeloid neoplasm.  There is a myeloproliferative  component as supported by previous JAK2 positivity.  However, there are  also dyspoietic changes primarily involving the megakaryocytic cell line  with numerous hypolobated/unilobated forms in addition to  dysgranulopoiesis to a lesser extent.  This is associated with  eosinophilia.  It is not entirely clear whether the overall findings  represent a myeloproliferative neoplasm with treatment related changes  or represent a primary myeloproliferative/myelodysplastic neoplasm  including but not limited to myeloid neoplasms with eosinophilia.  Correlation with cytogenetic and FISH studies strongly recommended.      RADIOGRAPHIC STUDIES: I have personally reviewed the radiological images as listed and agreed with the findings in the report. No results found.    IR IMAGING GUIDED PORT INSERTION  Result Date: 09/14/2022 INDICATION: Poor IV access.  Myelodysplastic  syndrome EXAM: IMPLANTED PORT A CATH PLACEMENT WITH ULTRASOUND AND FLUOROSCOPIC GUIDANCE MEDICATIONS: None ANESTHESIA/SEDATION: Moderate (conscious) sedation was employed during this procedure. A total of Versed  3 mg and Fentanyl  100 mcg was administered intravenously. Moderate Sedation Time: 20 minutes. The patient's level of consciousness and vital signs were monitored continuously by radiology nursing throughout the procedure under my direct supervision. FLUOROSCOPY TIME:  Fluoroscopic dose; 1 mGy  COMPLICATIONS: None immediate. PROCEDURE: The procedure, risks, benefits, and alternatives were explained to the patient. Questions regarding the procedure were encouraged and answered. The patient understands and consents to the procedure. The RIGHT neck and chest were prepped with chlorhexidine  in a sterile fashion, and a sterile drape was applied covering the operative field. Maximum barrier sterile technique with sterile gowns and gloves were used for the procedure. A timeout was performed prior to the initiation of the procedure. Local anesthesia was provided with 1% lidocaine  with epinephrine . After creating a small venotomy incision, a micropuncture kit was utilized to access the internal jugular vein under direct, real-time ultrasound guidance. Ultrasound image documentation was performed. The microwire was kinked to measure appropriate catheter length. A subcutaneous port pocket was then created along the upper chest wall utilizing a combination of sharp and blunt dissection. The pocket was irrigated with sterile saline. A single lumen ISP power injectable port was chosen for placement. The 8 Fr catheter was tunneled from the port pocket site to the venotomy incision. The port was placed in the pocket. The external catheter was trimmed to appropriate length. At the venotomy, an 8 Fr peel-away sheath was placed over a guidewire under fluoroscopic guidance. The catheter was then placed through the sheath and the sheath was removed. Final catheter positioning was confirmed and documented with a fluoroscopic spot radiograph. The port was accessed with a Huber needle, aspirated and flushed with heparinized saline. The port pocket incision was closed with interrupted 3-0 Vicryl suture then Dermabond was applied, including at the venotomy incision. Dressings were placed. The patient tolerated the procedure well without immediate post procedural complication. IMPRESSION: Successful placement of a RIGHT internal  jugular approach power injectable Port-A-Cath. The tip of the catheter is positioned within the proximal RIGHT atrium. The catheter is ready for immediate use. Art Largo, MD [facility] Electronically Signed   By: Art Largo M.D.   On: 09/14/2022 17:18   CT ANGIO GI BLEED  Result Date: 08/31/2022 CLINICAL DATA:  Acute mesenteric ischemia. Abdominal pain. History of myelodysplastic syndrome. EXAM: CTA ABDOMEN AND PELVIS WITHOUT AND WITH CONTRAST TECHNIQUE: Multidetector CT imaging of the abdomen and pelvis was performed using the standard protocol during bolus administration of intravenous contrast. Multiplanar reconstructed images and MIPs were obtained and reviewed to evaluate the vascular anatomy. RADIATION DOSE REDUCTION: This exam was performed according to the departmental dose-optimization program which includes automated exposure control, adjustment of the mA and/or kV according to patient size and/or use of iterative reconstruction technique. CONTRAST:  OMNIPAQUE  IOHEXOL  350 MG/ML SOLN COMPARISON:  Abdominal ultrasound examination 04/03/2021 FINDINGS: VASCULAR Aorta: Normal caliber abdominal aorta. Scattered atherosclerotic calcifications. No dissection. Celiac: Normal SMA: Normal Renals: Normal IMA: Patent Inflow: Normal Proximal Outflow: Normal Veins: Normal Review of the MIP images confirms the above findings. NON-VASCULAR Lower chest: Small left pleural effusion and bibasilar atelectasis. The heart is normal in size. No pericardial effusion. There is a moderate to large hiatal hernia. Hepatobiliary: No hepatic lesions or intrahepatic biliary dilatation. The gallbladder is unremarkable. No common bile duct  dilatation. Pancreas: No mass, inflammation or ductal dilatation. Spleen: Massive splenomegaly. The spleen measures 20 x 16 x 11 cm. No splenic lesions or splenic infarct. Adrenals/Urinary Tract: The left kidney is displaced  medially and inferiorly by the enlarged spleen. No worrisome renal lesions,, hydronephrosis or pyelonephritis. There is a lower pole right renal calculus and there are 2 distal left ureteral calculi more proximal calculus measures 4 mm and the more distal calculus measures 5 mm. I do not see any hydroureter or hydronephrosis. Stomach/Bowel: The stomach, duodenum, small bowel and colon are grossly normal. No inflammatory changes, mass lesions or obstructive findings. Lymphatic: No abdominal or pelvic lymphadenopathy. Reproductive: The prostate gland is enlarged. The seminal vesicles are unremarkable. Other: No pelvic mass or adenopathy. No free pelvic fluid collections. No inguinal mass or adenopathy. No abdominal wall hernia or subcutaneous lesions. Musculoskeletal: No significant bony findings. IMPRESSION: 1. Normal caliber abdominal aorta and no dissection. The branch vessels are normal. 2. Splenomegaly. 3. Two distal left ureteral calculi but no hydroureter or hydronephrosis. 4. Lower pole right renal calculus. 5. Moderate to large hiatal hernia. 6. Small left pleural effusion and bibasilar atelectasis. Electronically Signed   By: Marrian Siva M.D.   On: 08/31/2022 18:13     ASSESSMENT & PLAN:   78 y.o. male with   #1 JAK2-positive myeloproliferative neoplasm-primarily presenting with thrombocytosis and associated MDS causing refractory anemia -No polycythemia.  -Mild leukocytosis.  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 Has grade 1 out of 3 reticulin fibrosis.  Uncertain if this is primary or secondary. -LDH has remained within normal limits. 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 -CT Angio GI bleed scan from 09/01/2022 showed kidney stones.and significant splenomegaly  PLAN:  -Discussed lab results on 03/28/2024 in detail with patient. CBC showed WBC of 5.5K, hemoglobin of 7.9, and platelets of 160K. -discussed that his fatigue is from multiple  parameters, including iron -overload and anemia -anemia profile on 01/02/2024 showed ferritin 3,854 ng/mL -Discussed that we cannot limit ferritin unless transfusion support is limited -discussed that there would only be indication for blood transfusion in this context when hgb is less than 7. When hgb is between 7-8, there is some leeway in this regard.  -Discussed that we will balance and limit transfusions to some extent so as to not add too much iron  to the system which would potentially cause organ injury and be life-limiting -patient is agreeable to holding blood transfusion today -will hold off on blood transfusion at this time -will start low-dose Jadenu iron  chelating therapy  -discussed that Iron -chelating therapy can drop blood counts. -Patient has tolerated Vonjo  200 mg po BID with no major toxicity issues, and he shall continue this dose as this time  -discussed that if there is a urinary infection, it can cause inflammation, swelling, and confusion -patient is agreeable to testing urine sample  -will order urine sample to ensure there is no urinary infection -will also order iron  labs, CMP, and testosterone  lab  Follow-up: Additional labs today Weekly labs and PRBC transfusion x 12 appointments  MD visit in 6 weeks  The total time spent in the appointment was 32 minutes* .  All of the patient's questions were answered with apparent satisfaction. The patient knows to call the clinic with any problems, questions or concerns.   Jacquelyn Matt MD MS AAHIVMS [facility] Hematology/Oncology Physician [facility]  .*Total Encounter Time as defined by the Centers for Medicare and Medicaid Services includes, in addition to  the face-to-face time of a patient visit (documented in the note above) non-face-to-face time: obtaining and reviewing outside history, ordering and reviewing medications, tests or procedures, care coordination (communications with other health care  professionals or caregivers) and documentation in the medical record.    I,Mitra Faeizi,acting as a Neurosurgeon for Jacquelyn Matt, MD.,have documented all relevant documentation on the behalf of Jacquelyn Matt, MD,as directed by  Jacquelyn Matt, MD while in the presence of Jacquelyn Matt, MD.  .I have reviewed the above documentation for accuracy and completeness, and I agree with the above. .Sheana Bir Kishore Dessiree Sze MD

## 2024-03-28 ENCOUNTER — Inpatient Hospital Stay

## 2024-03-28 ENCOUNTER — Inpatient Hospital Stay: Admitting: Hematology

## 2024-03-28 ENCOUNTER — Other Ambulatory Visit: Payer: Self-pay | Admitting: *Deleted

## 2024-03-28 ENCOUNTER — Other Ambulatory Visit: Payer: Self-pay

## 2024-03-28 VITALS — BP 119/55 | HR 65 | Temp 97.9°F | Resp 20 | Wt 161.0 lb

## 2024-03-28 DIAGNOSIS — D464 Refractory anemia, unspecified: Secondary | ICD-10-CM | POA: Diagnosis not present

## 2024-03-28 DIAGNOSIS — D649 Anemia, unspecified: Secondary | ICD-10-CM | POA: Diagnosis not present

## 2024-03-28 DIAGNOSIS — D75839 Thrombocytosis, unspecified: Secondary | ICD-10-CM | POA: Diagnosis not present

## 2024-03-28 DIAGNOSIS — D469 Myelodysplastic syndrome, unspecified: Secondary | ICD-10-CM

## 2024-03-28 DIAGNOSIS — Z79899 Other long term (current) drug therapy: Secondary | ICD-10-CM | POA: Diagnosis not present

## 2024-03-28 DIAGNOSIS — D72829 Elevated white blood cell count, unspecified: Secondary | ICD-10-CM | POA: Diagnosis not present

## 2024-03-28 DIAGNOSIS — Z8719 Personal history of other diseases of the digestive system: Secondary | ICD-10-CM | POA: Diagnosis not present

## 2024-03-28 DIAGNOSIS — Z79624 Long term (current) use of inhibitors of nucleotide synthesis: Secondary | ICD-10-CM | POA: Diagnosis not present

## 2024-03-28 DIAGNOSIS — E782 Mixed hyperlipidemia: Secondary | ICD-10-CM | POA: Diagnosis not present

## 2024-03-28 DIAGNOSIS — N202 Calculus of kidney with calculus of ureter: Secondary | ICD-10-CM | POA: Diagnosis not present

## 2024-03-28 DIAGNOSIS — Z87891 Personal history of nicotine dependence: Secondary | ICD-10-CM | POA: Diagnosis not present

## 2024-03-28 DIAGNOSIS — R161 Splenomegaly, not elsewhere classified: Secondary | ICD-10-CM | POA: Diagnosis not present

## 2024-03-28 DIAGNOSIS — J90 Pleural effusion, not elsewhere classified: Secondary | ICD-10-CM | POA: Diagnosis not present

## 2024-03-28 DIAGNOSIS — I7090 Unspecified atherosclerosis: Secondary | ICD-10-CM | POA: Diagnosis not present

## 2024-03-28 DIAGNOSIS — R300 Dysuria: Secondary | ICD-10-CM

## 2024-03-28 DIAGNOSIS — K449 Diaphragmatic hernia without obstruction or gangrene: Secondary | ICD-10-CM | POA: Diagnosis not present

## 2024-03-28 DIAGNOSIS — Z860100 Personal history of colon polyps, unspecified: Secondary | ICD-10-CM | POA: Diagnosis not present

## 2024-03-28 DIAGNOSIS — D539 Nutritional anemia, unspecified: Secondary | ICD-10-CM | POA: Diagnosis not present

## 2024-03-28 LAB — CBC WITH DIFFERENTIAL (CANCER CENTER ONLY)
Abs Immature Granulocytes: 0.12 10*3/uL — ABNORMAL HIGH (ref 0.00–0.07)
Basophils Absolute: 0.2 10*3/uL — ABNORMAL HIGH (ref 0.0–0.1)
Basophils Relative: 4 %
Eosinophils Absolute: 0.3 10*3/uL (ref 0.0–0.5)
Eosinophils Relative: 6 %
HCT: 23.7 % — ABNORMAL LOW (ref 39.0–52.0)
Hemoglobin: 7.9 g/dL — ABNORMAL LOW (ref 13.0–17.0)
Immature Granulocytes: 2 %
Lymphocytes Relative: 11 %
Lymphs Abs: 0.6 10*3/uL — ABNORMAL LOW (ref 0.7–4.0)
MCH: 32.8 pg (ref 26.0–34.0)
MCHC: 33.3 g/dL (ref 30.0–36.0)
MCV: 98.3 fL (ref 80.0–100.0)
Monocytes Absolute: 0.5 10*3/uL (ref 0.1–1.0)
Monocytes Relative: 9 %
Neutro Abs: 3.7 10*3/uL (ref 1.7–7.7)
Neutrophils Relative %: 68 %
Platelet Count: 160 10*3/uL (ref 150–400)
RBC: 2.41 MIL/uL — ABNORMAL LOW (ref 4.22–5.81)
RDW: 22.7 % — ABNORMAL HIGH (ref 11.5–15.5)
WBC Count: 5.5 10*3/uL (ref 4.0–10.5)
nRBC: 0 % (ref 0.0–0.2)

## 2024-03-28 LAB — CMP (CANCER CENTER ONLY)
ALT: 42 U/L (ref 0–44)
AST: 19 U/L (ref 15–41)
Albumin: 4.4 g/dL (ref 3.5–5.0)
Alkaline Phosphatase: 50 U/L (ref 38–126)
Anion gap: 6 (ref 5–15)
BUN: 21 mg/dL (ref 8–23)
CO2: 30 mmol/L (ref 22–32)
Calcium: 8.9 mg/dL (ref 8.9–10.3)
Chloride: 103 mmol/L (ref 98–111)
Creatinine: 1.24 mg/dL (ref 0.61–1.24)
GFR, Estimated: 59 mL/min — ABNORMAL LOW (ref >60)
Glucose, Bld: 127 mg/dL — ABNORMAL HIGH (ref 70–99)
Potassium: 4.4 mmol/L (ref 3.5–5.1)
Sodium: 139 mmol/L (ref 135–145)
Total Bilirubin: 0.9 mg/dL (ref 0.0–1.2)
Total Protein: 6.3 g/dL — ABNORMAL LOW (ref 6.5–8.1)

## 2024-03-28 LAB — URINALYSIS, COMPLETE (UACMP) WITH MICROSCOPIC
Bacteria, UA: NONE SEEN
Bilirubin Urine: NEGATIVE
Glucose, UA: NEGATIVE mg/dL
Hgb urine dipstick: NEGATIVE
Ketones, ur: NEGATIVE mg/dL
Leukocytes,Ua: NEGATIVE
Nitrite: NEGATIVE
Protein, ur: NEGATIVE mg/dL
Specific Gravity, Urine: 1.019 (ref 1.005–1.030)
pH: 5 (ref 5.0–8.0)

## 2024-03-28 LAB — SAMPLE TO BLOOD BANK

## 2024-03-28 LAB — IRON AND IRON BINDING CAPACITY (CC-WL,HP ONLY)
Iron: 178 ug/dL (ref 45–182)
Saturation Ratios: 97 % — ABNORMAL HIGH (ref 17.9–39.5)
TIBC: 183 ug/dL — ABNORMAL LOW (ref 250–450)
UIBC: 5 ug/dL — ABNORMAL LOW (ref 117–376)

## 2024-03-28 LAB — HEMOGLOBIN A1C
Hgb A1c MFr Bld: 5.9 % — ABNORMAL HIGH (ref 4.8–5.6)
Mean Plasma Glucose: 122.63 mg/dL

## 2024-03-28 MED ORDER — SODIUM CHLORIDE 0.9% FLUSH
10.0000 mL | Freq: Once | INTRAVENOUS | Status: AC
  Administered 2024-03-28: 10 mL

## 2024-03-29 ENCOUNTER — Encounter (INDEPENDENT_AMBULATORY_CARE_PROVIDER_SITE_OTHER): Admitting: Ophthalmology

## 2024-03-29 DIAGNOSIS — H43813 Vitreous degeneration, bilateral: Secondary | ICD-10-CM | POA: Diagnosis not present

## 2024-03-29 DIAGNOSIS — H33302 Unspecified retinal break, left eye: Secondary | ICD-10-CM

## 2024-03-29 DIAGNOSIS — D3131 Benign neoplasm of right choroid: Secondary | ICD-10-CM

## 2024-03-29 LAB — URINE CULTURE: Culture: NO GROWTH

## 2024-03-29 LAB — FERRITIN: Ferritin: 5406 ng/mL — ABNORMAL HIGH (ref 24–336)

## 2024-04-01 ENCOUNTER — Encounter: Payer: Self-pay | Admitting: Hematology

## 2024-04-01 MED ORDER — DEFERASIROX 180 MG PO TABS: 180.0000 mg | ORAL_TABLET | Freq: Every day | ORAL | 1 refills | Status: DC

## 2024-04-01 NOTE — Progress Notes (Incomplete)
 HEMATOLOGY/ONCOLOGY PROGRESS NOTE:   Date of Service: 03/28/2024  Patient Care Team: Fernando April, DO as PCP - General (Family Medicine)  CHIEF COMPLAINTS:  Follow-up for continued evaluation and management of JAK2 positive myeloproliferative neoplasm/MDS  INTERVAL HISTORY:  Fernando Lee is a 78 y.o. male here for continued evaluation and management of his MPN/MDS with refractory anemia and thrombocytosis.   Patient was last seen by me on 02/10/2024 and reported mild blurry vision in his left eye, abdominal pain sometimes in the ribs, loss of libido, and mild leg swelling.  Patient is accompanied by his wife during today's visit. He reports that he has felt fairly stable overall since his last clinical visit and denies any new concerns.  He reports that he has an upcoming appointment with Dr. Corinne Dicker on Monday, 04/02/2024, and will have his iron  levels checked.  Patient reports that he has his port placed.   He denies any acute pain or abdominal pain.  Patient is tolerating Vonjo  200 mg po BID with no new or major toxicity issues.   He notes that he takes Colase which keeps his bowel movements regular.   Patient complains of some urinary issues and notes that he is needing to see a urologist. He notes that he has been diagnosed with an enlarged prostate in the past and is taking Alfuzosin .   Patient reports previous bladder pain which he attributes to drinking a fair amount of lemonade recently and not drinking much water. Patient denies any bladder pain at this time.   He notes that he was seen by his PCP recently but did not have his testosterone  levels tested.   He reports some increased personal stress in the last week including due to his dog's health issues.   MEDICAL HISTORY:  Past Medical History:  Diagnosis Date  . Arthritis of right knee   . Cervicalgia   . Chest discomfort    normal stress echo  . Chest pain   . Colon polyps   . Dyslipidemia   .  Fluttering sensation of heart   . GERD without esophagitis   . Hematochezia   . Hyperglycemia   . Hyperlipidemia   . 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 Internal hemorrhoids   . Kidney stones   . Male erectile dysfunction, unspecified   . Mixed dyslipidemia   . Mixed hyperlipidemia   . Ocular migraine   . PAC (premature atrial contraction)   . Personal history of colonic polyps   . Prediabetes   . Residual hemorrhoidal skin tags   . Spleen enlarged    nov 2023 hospitalized  . Unspecified hemorrhoids     SURGICAL HISTORY: Past Surgical History:  Procedure Laterality Date  . BONE MARROW BIOPSY    . IR IMAGING GUIDED PORT INSERTION  09/14/2022  . KIDNEY STONE SURGERY     retrieval    SOCIAL HISTORY: Social History   Socioeconomic History  . Marital status: Married    Spouse name: Not on file  . Number of children: Not on file  . Years of education: Not on file  . Highest education level: Not on file  Occupational History  . Not on file  Tobacco Use  . Smoking status: Former    Types: Cigarettes  . Smokeless tobacco: Never  Substance and Sexual Activity  . Alcohol use: Yes    Alcohol/week: 1.0 standard drink of alcohol    Types: 1 Cans of beer per week  .  Drug use: Not on file  . Sexual activity: Not on file  Other Topics Concern  . Not on file  Social History Narrative  . Not on file   Social Drivers of Health   Financial Resource Strain: Not on file  Food Insecurity: Patient Declined (09/16/2023)   Hunger Vital Sign   . Worried About Programme researcher, broadcasting/film/video in the Last Year: Patient declined   . Ran Out of Food in the Last Year: Patient declined  Transportation Needs: Patient Declined (09/16/2023)   PRAPARE - Transportation   . Lack of Transportation (Medical): Patient declined   . Lack of Transportation (Non-Medical): Patient declined  Physical Activity: Not on file  Stress: Not on file  Social Connections: Not on file  Intimate  Partner Violence: Patient Declined (09/16/2023)   Humiliation, Afraid, Rape, and Kick questionnaire   . Fear of Current or Ex-Partner: Patient declined   . Emotionally Abused: Patient declined   . Physically Abused: Patient declined   . Sexually Abused: Patient declined    FAMILY HISTORY: Family History  Problem Relation Age of Onset  . Stroke Brother   . Congestive Heart Failure Brother     ALLERGIES:  has no known allergies.  MEDICATIONS:  Current Outpatient Medications  Medication Sig Dispense Refill  . potassium chloride  SA (KLOR-CON  M) 20 MEQ tablet Take 1 tablet (20 mEq total) by mouth 2 (two) times daily. 30 tablet 2  . acetaminophen  (TYLENOL ) 325 MG tablet Take 2 tablets (650 mg total) by mouth every 6 (six) hours as needed for mild pain (pain score 1-3) (or Fever >/= 101). 30 tablet 0  . alfuzosin  (UROXATRAL ) 10 MG 24 hr tablet Take 10 mg by mouth daily with breakfast.    . B Complex Vitamins (B COMPLEX PO) Take 1 tablet by mouth daily.    . Cholecalciferol  (VITAMIN D3) 50 MCG (2000 UT) TABS Take 2,000 Units by mouth daily.    . docusate sodium  (COLACE) 100 MG capsule Take 100 mg by mouth 2 (two) times daily.    . fish oil-omega-3 fatty acids  1000 MG capsule Take 1 g by mouth daily.    . furosemide  (LASIX ) 20 MG tablet Take 1 tablet by mouth once daily 30 tablet 0  . GLUCOSAMINE CHONDROITIN MSM PO Take 1 tablet by mouth daily.    . Multiple Vitamin (MULTIVITAMIN) tablet Take 1 tablet by mouth daily with breakfast.    . pacritinib  citrate (VONJO ) 100 MG capsule Take 2 capsules (200 mg total) by mouth 2 (two) times daily. 120 capsule 2  . Turmeric (QC TUMERIC COMPLEX PO) Take 750 mg by mouth daily.    . valACYclovir  (VALTREX ) 500 MG tablet Take 1 tablet (500 mg total) by mouth 2 (two) times daily. 60 tablet 5  . vitamin C  (ASCORBIC ACID ) 250 MG tablet Take 500 mg by mouth daily.     No current facility-administered medications for this visit.    REVIEW OF SYSTEMS:    10  Point review of Systems was done is negative except as noted above.   PHYSICAL EXAMINATION: .BP (!) 119/55   Pulse 65   Temp 97.9 F (36.6 C)   Resp 20   Wt 161 lb (73 kg)   SpO2 100%   BMI 25.22 kg/m    GENERAL:alert, in no acute distress and comfortable SKIN: no acute rashes, no significant lesions EYES: conjunctiva are pink and non-injected, sclera anicteric OROPHARYNX: MMM, no exudates, no oropharyngeal erythema or ulceration NECK: supple, no  JVD LYMPH:  no palpable lymphadenopathy in the cervical, axillary or inguinal regions LUNGS: clear to auscultation b/l with normal respiratory effort HEART: regular rate & rhythm ABDOMEN:  normoactive bowel sounds , non tender, not distended. Extremity: no pedal edema PSYCH: alert & oriented x 3 with fluent speech NEURO: no focal motor/sensory deficits   LABORATORY DATA:  I have reviewed the data as listed .    Latest Ref Rng & Units 03/28/2024    1:24 PM 03/22/2024    7:41 AM 03/15/2024    7:56 AM  CBC  WBC 4.0 - 10.5 K/uL 5.5  5.8  5.4   Hemoglobin 13.0 - 17.0 g/dL 7.9  7.7  8.2   Hematocrit 39.0 - 52.0 % 23.7  22.9  24.6   Platelets 150 - 400 K/uL 160  147  174      Iron /TIBC/Ferritin/ %Sat    Component Value Date/Time   IRON  178 03/28/2024 1446   TIBC 183 (L) 03/28/2024 1446   FERRITIN 5,406 (H) 03/28/2024 1446   IRONPCTSAT 97 (H) 03/28/2024 1446      Latest Ref Rng & Units 03/28/2024    2:46 PM 09/19/2023    3:49 AM 09/18/2023    3:30 AM  CMP  Glucose 70 - 99 mg/dL 161  096  045   BUN 8 - 23 mg/dL 21  16  19    Creatinine 0.61 - 1.24 mg/dL 4.09  8.11  9.14   Sodium 135 - 145 mmol/L 139  135  135   Potassium 3.5 - 5.1 mmol/L 4.4  4.1  3.8   Chloride 98 - 111 mmol/L 103  103  104   CO2 22 - 32 mmol/L 30  26  26    Calcium  8.9 - 10.3 mg/dL 8.9  8.5  8.3   Total Protein 6.5 - 8.1 g/dL 6.3     Total Bilirubin 0.0 - 1.2 mg/dL 0.9     Alkaline Phos 38 - 126 U/L 50     AST 15 - 41 U/L 19     ALT 0 - 44 U/L 42        Lab Results  Component Value Date   LDH 110 01/04/2022    02/23/2021 BCR ABL    02/23/2021 JAK2   Surgical Pathology  CASE: [id number]  PATIENT: Naquan Marini  Bone Marrow Report  Clinical History: MPN with progressive anemia  (BH)  DIAGNOSIS:   BONE MARROW, ASPIRATE, CLOT, CORE:  -Hypercellular bone marrow with features of myeloid neoplasm  -See comment   PERIPHERAL BLOOD:  -Macrocytic anemia  -Leukocytosis  -Thrombocytosis   COMMENT:   The bone marrow/peripheral blood show persistent involvement by  previously known myeloid neoplasm.  There is a myeloproliferative  component as supported by previous JAK2 positivity.  However, there are  also dyspoietic changes primarily involving the megakaryocytic cell line  with numerous hypolobated/unilobated forms in addition to  dysgranulopoiesis to a lesser extent.  This is associated with  eosinophilia.  It is not entirely clear whether the overall findings  represent a myeloproliferative neoplasm with treatment related changes  or represent a primary myeloproliferative/myelodysplastic neoplasm  including but not limited to myeloid neoplasms with eosinophilia.  Correlation with cytogenetic and FISH studies strongly recommended.      RADIOGRAPHIC STUDIES: I have personally reviewed the radiological images as listed and agreed with the findings in the report. No results found.    IR IMAGING GUIDED PORT INSERTION  Result Date: 09/14/2022 INDICATION: Poor IV access.  Myelodysplastic  syndrome EXAM: IMPLANTED PORT A CATH PLACEMENT WITH ULTRASOUND AND FLUOROSCOPIC GUIDANCE MEDICATIONS: None ANESTHESIA/SEDATION: Moderate (conscious) sedation was employed during this procedure. A total of Versed  3 mg and Fentanyl  100 mcg was administered intravenously. Moderate Sedation Time: 20 minutes. The patient's level of consciousness and vital signs were monitored continuously by radiology nursing throughout the procedure under my  direct supervision. FLUOROSCOPY TIME:  Fluoroscopic dose; 1 mGy COMPLICATIONS: None immediate. PROCEDURE: The procedure, risks, benefits, and alternatives were explained to the patient. Questions regarding the procedure were encouraged and answered. The patient understands and consents to the procedure. The RIGHT neck and chest were prepped with chlorhexidine  in a sterile fashion, and a sterile drape was applied covering the operative field. Maximum barrier sterile technique with sterile gowns and gloves were used for the procedure. A timeout was performed prior to the initiation of the procedure. Local anesthesia was provided with 1% lidocaine  with epinephrine . After creating a small venotomy incision, a micropuncture kit was utilized to access the internal jugular vein under direct, real-time ultrasound guidance. Ultrasound image documentation was performed. The microwire was kinked to measure appropriate catheter length. A subcutaneous port pocket was then created along the upper chest wall utilizing a combination of sharp and blunt dissection. The pocket was irrigated with sterile saline. A single lumen ISP power injectable port was chosen for placement. The 8 Fr catheter was tunneled from the port pocket site to the venotomy incision. The port was placed in the pocket. The external catheter was trimmed to appropriate length. At the venotomy, an 8 Fr peel-away sheath was placed over a guidewire under fluoroscopic guidance. The catheter was then placed through the sheath and the sheath was removed. Final catheter positioning was confirmed and documented with a fluoroscopic spot radiograph. The port was accessed with a Huber needle, aspirated and flushed with heparinized saline. The port pocket incision was closed with interrupted 3-0 Vicryl suture then Dermabond was applied, including at the venotomy incision. Dressings were placed. The patient tolerated the procedure well without immediate post procedural  complication. IMPRESSION: Successful placement of a RIGHT internal jugular approach power injectable Port-A-Cath. The tip of the catheter is positioned within the proximal RIGHT atrium. The catheter is ready for immediate use. Art Largo, MD [facility] Radiology Electronically Signed   By: Art Largo M.D.   On: 09/14/2022 17:18   CT ANGIO GI BLEED  Result Date: 08/31/2022 CLINICAL DATA:  Acute mesenteric ischemia. Abdominal pain. History of myelodysplastic syndrome. EXAM: CTA ABDOMEN AND PELVIS WITHOUT AND WITH CONTRAST TECHNIQUE: Multidetector CT imaging of the abdomen and pelvis was performed using the standard protocol during bolus administration of intravenous contrast. Multiplanar reconstructed images and MIPs were obtained and reviewed to evaluate the vascular anatomy. RADIATION DOSE REDUCTION: This exam was performed according to the departmental dose-optimization program which includes automated exposure control, adjustment of the mA and/or kV according to patient size and/or use of iterative reconstruction technique. CONTRAST:  OMNIPAQUE  IOHEXOL  350 MG/ML SOLN COMPARISON:  Abdominal ultrasound examination 04/03/2021 FINDINGS: VASCULAR Aorta: Normal caliber abdominal aorta. Scattered atherosclerotic calcifications. No dissection. Celiac: Normal SMA: Normal Renals: Normal IMA: Patent Inflow: Normal Proximal Outflow: Normal Veins: Normal Review of the MIP images confirms the above findings. NON-VASCULAR Lower chest: Small left pleural effusion and bibasilar atelectasis. The heart is normal in size. No pericardial effusion. There is a moderate to large hiatal hernia. Hepatobiliary: No hepatic lesions or intrahepatic biliary dilatation. The gallbladder is unremarkable. No common bile duct  dilatation. Pancreas: No mass, inflammation or ductal dilatation. Spleen: Massive splenomegaly. The spleen measures 20 x 16 x 11 cm. No splenic lesions or splenic  infarct. Adrenals/Urinary Tract: The left kidney is displaced medially and inferiorly by the enlarged spleen. No worrisome renal lesions,, hydronephrosis or pyelonephritis. There is a lower pole right renal calculus and there are 2 distal left ureteral calculi more proximal calculus measures 4 mm and the more distal calculus measures 5 mm. I do not see any hydroureter or hydronephrosis. Stomach/Bowel: The stomach, duodenum, small bowel and colon are grossly normal. No inflammatory changes, mass lesions or obstructive findings. Lymphatic: No abdominal or pelvic lymphadenopathy. Reproductive: The prostate gland is enlarged. The seminal vesicles are unremarkable. Other: No pelvic mass or adenopathy. No free pelvic fluid collections. No inguinal mass or adenopathy. No abdominal wall hernia or subcutaneous lesions. Musculoskeletal: No significant bony findings. IMPRESSION: 1. Normal caliber abdominal aorta and no dissection. The branch vessels are normal. 2. Splenomegaly. 3. Two distal left ureteral calculi but no hydroureter or hydronephrosis. 4. Lower pole right renal calculus. 5. Moderate to large hiatal hernia. 6. Small left pleural effusion and bibasilar atelectasis. Electronically Signed   By: Marrian Siva M.D.   On: 08/31/2022 18:13     ASSESSMENT & PLAN:   78 y.o. male with   #1 JAK2-positive myeloproliferative neoplasm-primarily presenting with thrombocytosis and associated MDS causing refractory anemia -No polycythemia.  -Mild leukocytosis.  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 Has grade 1 out of 3 reticulin fibrosis.  Uncertain if this is primary or secondary. -LDH has remained within normal limits. 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 -CT Angio GI bleed scan from 09/01/2022 showed kidney stones.and significant splenomegaly  PLAN:  -Discussed lab results on 03/28/2024 in detail with patient. CBC showed WBC of 5.5K, hemoglobin of 7.9, and  platelets of 160K. -discussed that his fatigue is from multiple parameters, including iron -overload and anemia -anemia profile on 01/02/2024 showed ferritin 3,854 ng/mL -Discussed that we cannot limit ferritin unless transfusion support is limited -discussed that there would only be indication for blood transfusion in this context when hgb is less than 7. When hgb is between 7-8, there is some leeway in this regard.  -Discussed that we will balance and limit transfusions to some extent so as to not add too much iron  to the system which would potentially cause organ injury and be life-limiting -patient is agreeable to holding blood transfusion today -will hold off on blood transfusion at this time -will start low-dose Jadenu iron  chelating therapy  -discussed that Iron -chelating therapy can drop blood counts. -Patient has tolerated Vonjo  200 mg po BID with no major toxicity issues, and he shall continue this dose as this time  -discussed that if there is a urinary infection, it can cause inflammation, swelling, and confusion -patient is agreeable to testing urine sample  -will order urine sample to ensure there is no urinary infection -will also order iron  labs, CMP, and testosterone  lab  Follow-up: Additional labs today Weekly labs and PRBC transfusion x 12 appointments  MD visit in 6 weeks  The total time spent in the appointment was 32 minutes* .  All of the patient's questions were answered with apparent satisfaction. The patient knows to call the clinic with any problems, questions or concerns.   Jacquelyn Matt MD MS AAHIVMS [facility] Hematology/Oncology Physician [facility]  .*Total Encounter Time as defined by the Centers for Medicare and Medicaid Services includes, in addition to  the face-to-face time of a patient visit (documented in the note above) non-face-to-face time: obtaining and reviewing outside history, ordering and reviewing medications, tests or procedures,  care coordination (communications with other health care professionals or caregivers) and documentation in the medical record.    I,Mitra Faeizi,acting as a Neurosurgeon for Jacquelyn Matt, MD.,have documented all relevant documentation on the behalf of Jacquelyn Matt, MD,as directed by  Jacquelyn Matt, MD while in the presence of Jacquelyn Matt, MD.  .I have reviewed the above documentation for accuracy and completeness, and I agree with the above. .Zadia Uhde Kishore Nayelli Inglis MD

## 2024-04-02 ENCOUNTER — Other Ambulatory Visit: Payer: Self-pay | Admitting: Pharmacy Technician

## 2024-04-02 ENCOUNTER — Other Ambulatory Visit: Payer: Self-pay

## 2024-04-02 ENCOUNTER — Telehealth: Payer: Self-pay

## 2024-04-02 ENCOUNTER — Telehealth: Payer: Self-pay | Admitting: Pharmacy Technician

## 2024-04-02 ENCOUNTER — Other Ambulatory Visit (HOSPITAL_COMMUNITY): Payer: Self-pay

## 2024-04-02 DIAGNOSIS — C946 Myelodysplastic disease, not classified: Secondary | ICD-10-CM | POA: Diagnosis not present

## 2024-04-02 DIAGNOSIS — D469 Myelodysplastic syndrome, unspecified: Secondary | ICD-10-CM

## 2024-04-02 MED ORDER — DEFERASIROX 180 MG PO TABS
180.0000 mg | ORAL_TABLET | Freq: Every day | ORAL | 1 refills | Status: DC
  Filled 2024-04-02: qty 30, 30d supply, fill #0
  Filled 2024-04-22 – 2024-04-23 (×2): qty 30, 30d supply, fill #1

## 2024-04-02 NOTE — Progress Notes (Signed)
 Specialty Pharmacy Initial Fill Coordination Note  Fernando Lee is a 78 y.o. male contacted today regarding refills of specialty medication(s) Deferasirox .  Patient requested [name] at [facility] at [place]  on 04/03/24 In the afternoon.  Medication will be filled on 04/02/24.   Patient is aware of $0 copayment.

## 2024-04-02 NOTE — Telephone Encounter (Signed)
 Oral Oncology Patient Advocate Encounter   Received notification from [employer] that prior authorization for Deferasirox is required/requested.   Insurance verification completed.   The patient is insured through [employer] .   Per test claim: PA required; PA submitted to above mentioned insurance via CoverMyMeds Key/confirmation #/EOC [id number] Status is pending

## 2024-04-02 NOTE — Telephone Encounter (Addendum)
 Oral Chemotherapy Pharmacist Encounter  I spoke with patient for overview of: Jadenu for the treatment of hereditary hemochromatosis, planned duration until achievement of decreasing ferritin and maintenance of iron  burden in target range.   Counseled patient on administration, dosing, side effects, monitoring, drug-food interactions, safe handling, storage, and disposal.  Patient will take Jadenu 180mg  tablets, 1 tablets (180mg ) once daily on an empty stomach at least 30 minutes before food, preferably at the same time each day.  Patient knows to separate Jadenu administration for aluminum antacids.  Jadenu start date: 04/04/2024  Adverse effects include but are not limited to: nausea, vomiting, diarrhea, rash, renal changes, decreased blood counts, and hypersensitivity reactions.    Reviewed with patient importance of keeping a medication schedule and plan for any missed doses. No barriers to medication adherence identified.  Medication reconciliation performed and medication/allergy list updated. All questions answered. Patient voiced understanding and appreciation. Patient will pick up medication from [facility] long outpatient pharmacy after 2pm on 04/03/2024.  Medication education handout placed in mail for patient. Patient knows to call the office with questions or concerns. Oral Chemotherapy Clinic phone number provided to patient. Patient comes in to clinic next on 04/05/24 for labs and infusion. Next appointment with MD is on 05/18/24. Will send message to RN to see if he needs to be seen sooner.   Kelina Beauchamp, PharmD Hematology/Oncology Clinical Pharmacist [facility] [phone]

## 2024-04-02 NOTE — Telephone Encounter (Signed)
 Oral Oncology Patient Advocate Encounter  Prior Authorization for deferasirox has been approved.    PA# PA Case ID #: [id number]  Effective dates: now through 10/17/24  Patients co-pay is $0.   Roda Cirri, CPhT Specialty Pharmacy Patient Advocate Phone: [phone] Fax: [phone]

## 2024-04-02 NOTE — Progress Notes (Signed)
 Patient counseled on deferasirox in telephone encounter opened on 04/02/24.   Carlota Philley, PharmD Hematology/Oncology Clinical Pharmacist [facility] [phone]

## 2024-04-02 NOTE — Telephone Encounter (Signed)
 Oral Oncology Pharmacist Encounter  Received new prescription for Jadenu (deferasirox) for the treatment of iron  overload due to transfusion need from MDS, planned duration until disease progression or unacceptable toxicity or until iron  overload is corrected. Current ferritin is elevated to 5,406 and has been elevated >1,000 for last few lab draws with last one on 11/24/23 at 3,824.  Labs from 03/28/2024 (CBC, CMP, ferritin) assessed, patients creatinine is elevated at 1.24 which results in a creatinine clearance of 49.05. Any CrCl between 40-60 mL/min should be reduced by 50%. Prescription dose and frequency assessed for appropriateness. Prescription dose already modified and is being started at low dose.   Current medication list in Epic reviewed, no significant/ relevant DDIs with Jadenu identified.   Evaluated chart and no patient barriers to medication adherence noted.   Prescription has been e-scribed to the [facility] for benefits analysis and approval.  Oral Oncology Clinic will continue to follow for insurance authorization, copayment issues, initial counseling and start date.  Druscilla Petsch, PharmD Hematology/Oncology Clinical Pharmacist [place] [facility] [phone] 04/02/2024 9:36 AM

## 2024-04-04 ENCOUNTER — Other Ambulatory Visit: Payer: Self-pay

## 2024-04-04 DIAGNOSIS — D471 Chronic myeloproliferative disease: Secondary | ICD-10-CM

## 2024-04-04 DIAGNOSIS — D469 Myelodysplastic syndrome, unspecified: Secondary | ICD-10-CM

## 2024-04-05 ENCOUNTER — Inpatient Hospital Stay

## 2024-04-05 VITALS — BP 112/69 | HR 71 | Temp 97.9°F | Resp 18 | Ht 67.0 in | Wt 162.6 lb

## 2024-04-05 DIAGNOSIS — Z79899 Other long term (current) drug therapy: Secondary | ICD-10-CM | POA: Diagnosis not present

## 2024-04-05 DIAGNOSIS — Z860100 Personal history of colon polyps, unspecified: Secondary | ICD-10-CM | POA: Diagnosis not present

## 2024-04-05 DIAGNOSIS — Z79624 Long term (current) use of inhibitors of nucleotide synthesis: Secondary | ICD-10-CM | POA: Diagnosis not present

## 2024-04-05 DIAGNOSIS — K449 Diaphragmatic hernia without obstruction or gangrene: Secondary | ICD-10-CM | POA: Diagnosis not present

## 2024-04-05 DIAGNOSIS — D539 Nutritional anemia, unspecified: Secondary | ICD-10-CM | POA: Diagnosis not present

## 2024-04-05 DIAGNOSIS — D469 Myelodysplastic syndrome, unspecified: Secondary | ICD-10-CM

## 2024-04-05 DIAGNOSIS — D464 Refractory anemia, unspecified: Secondary | ICD-10-CM | POA: Diagnosis not present

## 2024-04-05 DIAGNOSIS — N202 Calculus of kidney with calculus of ureter: Secondary | ICD-10-CM | POA: Diagnosis not present

## 2024-04-05 DIAGNOSIS — R161 Splenomegaly, not elsewhere classified: Secondary | ICD-10-CM | POA: Diagnosis not present

## 2024-04-05 DIAGNOSIS — E782 Mixed hyperlipidemia: Secondary | ICD-10-CM | POA: Diagnosis not present

## 2024-04-05 DIAGNOSIS — J90 Pleural effusion, not elsewhere classified: Secondary | ICD-10-CM | POA: Diagnosis not present

## 2024-04-05 DIAGNOSIS — Z87891 Personal history of nicotine dependence: Secondary | ICD-10-CM | POA: Diagnosis not present

## 2024-04-05 DIAGNOSIS — D75839 Thrombocytosis, unspecified: Secondary | ICD-10-CM | POA: Diagnosis not present

## 2024-04-05 DIAGNOSIS — D471 Chronic myeloproliferative disease: Secondary | ICD-10-CM

## 2024-04-05 DIAGNOSIS — D72829 Elevated white blood cell count, unspecified: Secondary | ICD-10-CM | POA: Diagnosis not present

## 2024-04-05 DIAGNOSIS — I7090 Unspecified atherosclerosis: Secondary | ICD-10-CM | POA: Diagnosis not present

## 2024-04-05 DIAGNOSIS — Z8719 Personal history of other diseases of the digestive system: Secondary | ICD-10-CM | POA: Diagnosis not present

## 2024-04-05 LAB — CMP (CANCER CENTER ONLY)
ALT: 34 U/L (ref 0–44)
AST: 16 U/L (ref 15–41)
Albumin: 4.1 g/dL (ref 3.5–5.0)
Alkaline Phosphatase: 51 U/L (ref 38–126)
Anion gap: 6 (ref 5–15)
BUN: 19 mg/dL (ref 8–23)
CO2: 28 mmol/L (ref 22–32)
Calcium: 8.6 mg/dL — ABNORMAL LOW (ref 8.9–10.3)
Chloride: 103 mmol/L (ref 98–111)
Creatinine: 0.97 mg/dL (ref 0.61–1.24)
GFR, Estimated: >60 mL/min (ref >60)
Glucose, Bld: 207 mg/dL — ABNORMAL HIGH (ref 70–99)
Potassium: 4.3 mmol/L (ref 3.5–5.1)
Sodium: 137 mmol/L (ref 135–145)
Total Bilirubin: 1.2 mg/dL (ref 0.0–1.2)
Total Protein: 5.9 g/dL — ABNORMAL LOW (ref 6.5–8.1)

## 2024-04-05 LAB — CBC WITH DIFFERENTIAL (CANCER CENTER ONLY)
Abs Immature Granulocytes: 0.12 10*3/uL — ABNORMAL HIGH (ref 0.00–0.07)
Basophils Absolute: 0.1 10*3/uL (ref 0.0–0.1)
Basophils Relative: 3 %
Eosinophils Absolute: 0.3 10*3/uL (ref 0.0–0.5)
Eosinophils Relative: 5 %
HCT: 21.1 % — ABNORMAL LOW (ref 39.0–52.0)
Hemoglobin: 7 g/dL — ABNORMAL LOW (ref 13.0–17.0)
Immature Granulocytes: 2 %
Lymphocytes Relative: 9 %
Lymphs Abs: 0.5 10*3/uL — ABNORMAL LOW (ref 0.7–4.0)
MCH: 33.3 pg (ref 26.0–34.0)
MCHC: 33.2 g/dL (ref 30.0–36.0)
MCV: 100.5 fL — ABNORMAL HIGH (ref 80.0–100.0)
Monocytes Absolute: 0.5 10*3/uL (ref 0.1–1.0)
Monocytes Relative: 10 %
Neutro Abs: 3.5 10*3/uL (ref 1.7–7.7)
Neutrophils Relative %: 71 %
Platelet Count: 142 10*3/uL — ABNORMAL LOW (ref 150–400)
RBC: 2.1 MIL/uL — ABNORMAL LOW (ref 4.22–5.81)
RDW: 22.7 % — ABNORMAL HIGH (ref 11.5–15.5)
WBC Count: 4.9 10*3/uL (ref 4.0–10.5)
nRBC: 0 % (ref 0.0–0.2)

## 2024-04-05 LAB — PREPARE RBC (CROSSMATCH)

## 2024-04-05 LAB — SAMPLE TO BLOOD BANK

## 2024-04-05 MED ORDER — ACETAMINOPHEN 325 MG PO TABS
650.0000 mg | ORAL_TABLET | Freq: Once | ORAL | Status: AC
  Administered 2024-04-05: 650 mg via ORAL
  Filled 2024-04-05: qty 2

## 2024-04-05 MED ORDER — HEPARIN SOD (PORK) LOCK FLUSH 100 UNIT/ML IV SOLN
500.0000 [IU] | Freq: Once | INTRAVENOUS | Status: AC
  Administered 2024-04-05: 500 [IU]

## 2024-04-05 MED ORDER — SODIUM CHLORIDE 0.9% FLUSH
10.0000 mL | Freq: Once | INTRAVENOUS | Status: AC
  Administered 2024-04-05: 10 mL

## 2024-04-05 MED ORDER — METHYLPREDNISOLONE SODIUM SUCC 40 MG IJ SOLR
40.0000 mg | Freq: Once | INTRAMUSCULAR | Status: AC
  Administered 2024-04-05: 40 mg via INTRAVENOUS
  Filled 2024-04-05: qty 1

## 2024-04-05 MED ORDER — SODIUM CHLORIDE 0.9% IV SOLUTION
250.0000 mL | INTRAVENOUS | Status: DC
  Administered 2024-04-05: 100 mL via INTRAVENOUS

## 2024-04-05 NOTE — Patient Instructions (Signed)

## 2024-04-06 LAB — BPAM RBC
Blood Product Expiration Date: 202507152359
ISSUE DATE / TIME: 202506191036
Unit Type and Rh: 6200

## 2024-04-06 LAB — TYPE AND SCREEN
ABO/RH(D): A POS
Antibody Screen: NEGATIVE
Unit division: 0

## 2024-04-09 ENCOUNTER — Other Ambulatory Visit: Payer: Self-pay

## 2024-04-11 ENCOUNTER — Other Ambulatory Visit: Payer: Self-pay

## 2024-04-11 ENCOUNTER — Other Ambulatory Visit: Payer: Self-pay | Admitting: Pharmacy Technician

## 2024-04-11 DIAGNOSIS — D469 Myelodysplastic syndrome, unspecified: Secondary | ICD-10-CM

## 2024-04-11 NOTE — Progress Notes (Signed)
 Specialty Pharmacy Refill Coordination Note  Fernando Lee is a 78 y.o. male contacted today regarding refills of specialty medication(s) Pacritinib  Citrate (Vonjo )   Patient requested [name] at [facility] at [place] date: 04/13/24   Medication will be filled on 04/12/24.

## 2024-04-12 ENCOUNTER — Inpatient Hospital Stay

## 2024-04-12 ENCOUNTER — Other Ambulatory Visit: Payer: Self-pay | Admitting: *Deleted

## 2024-04-12 DIAGNOSIS — Z79899 Other long term (current) drug therapy: Secondary | ICD-10-CM | POA: Diagnosis not present

## 2024-04-12 DIAGNOSIS — Z79624 Long term (current) use of inhibitors of nucleotide synthesis: Secondary | ICD-10-CM | POA: Diagnosis not present

## 2024-04-12 DIAGNOSIS — E782 Mixed hyperlipidemia: Secondary | ICD-10-CM | POA: Diagnosis not present

## 2024-04-12 DIAGNOSIS — D469 Myelodysplastic syndrome, unspecified: Secondary | ICD-10-CM

## 2024-04-12 DIAGNOSIS — D464 Refractory anemia, unspecified: Secondary | ICD-10-CM | POA: Diagnosis not present

## 2024-04-12 DIAGNOSIS — R161 Splenomegaly, not elsewhere classified: Secondary | ICD-10-CM | POA: Diagnosis not present

## 2024-04-12 DIAGNOSIS — D72829 Elevated white blood cell count, unspecified: Secondary | ICD-10-CM | POA: Diagnosis not present

## 2024-04-12 DIAGNOSIS — Z87891 Personal history of nicotine dependence: Secondary | ICD-10-CM | POA: Diagnosis not present

## 2024-04-12 DIAGNOSIS — I7090 Unspecified atherosclerosis: Secondary | ICD-10-CM | POA: Diagnosis not present

## 2024-04-12 DIAGNOSIS — D539 Nutritional anemia, unspecified: Secondary | ICD-10-CM | POA: Diagnosis not present

## 2024-04-12 DIAGNOSIS — Z860100 Personal history of colon polyps, unspecified: Secondary | ICD-10-CM | POA: Diagnosis not present

## 2024-04-12 DIAGNOSIS — D75839 Thrombocytosis, unspecified: Secondary | ICD-10-CM | POA: Diagnosis not present

## 2024-04-12 DIAGNOSIS — N202 Calculus of kidney with calculus of ureter: Secondary | ICD-10-CM | POA: Diagnosis not present

## 2024-04-12 DIAGNOSIS — J90 Pleural effusion, not elsewhere classified: Secondary | ICD-10-CM | POA: Diagnosis not present

## 2024-04-12 DIAGNOSIS — Z8719 Personal history of other diseases of the digestive system: Secondary | ICD-10-CM | POA: Diagnosis not present

## 2024-04-12 DIAGNOSIS — K449 Diaphragmatic hernia without obstruction or gangrene: Secondary | ICD-10-CM | POA: Diagnosis not present

## 2024-04-12 LAB — CBC WITH DIFFERENTIAL (CANCER CENTER ONLY)
Abs Immature Granulocytes: 0.14 10*3/uL — ABNORMAL HIGH (ref 0.00–0.07)
Basophils Absolute: 0.2 10*3/uL — ABNORMAL HIGH (ref 0.0–0.1)
Basophils Relative: 4 %
Eosinophils Absolute: 0.3 10*3/uL (ref 0.0–0.5)
Eosinophils Relative: 6 %
HCT: 22.2 % — ABNORMAL LOW (ref 39.0–52.0)
Hemoglobin: 7.5 g/dL — ABNORMAL LOW (ref 13.0–17.0)
Immature Granulocytes: 3 %
Lymphocytes Relative: 9 %
Lymphs Abs: 0.5 10*3/uL — ABNORMAL LOW (ref 0.7–4.0)
MCH: 33.6 pg (ref 26.0–34.0)
MCHC: 33.8 g/dL (ref 30.0–36.0)
MCV: 99.6 fL (ref 80.0–100.0)
Monocytes Absolute: 0.5 10*3/uL (ref 0.1–1.0)
Monocytes Relative: 9 %
Neutro Abs: 3.7 10*3/uL (ref 1.7–7.7)
Neutrophils Relative %: 69 %
Platelet Count: 147 10*3/uL — ABNORMAL LOW (ref 150–400)
RBC: 2.23 MIL/uL — ABNORMAL LOW (ref 4.22–5.81)
RDW: 21.4 % — ABNORMAL HIGH (ref 11.5–15.5)
WBC Count: 5.4 10*3/uL (ref 4.0–10.5)
nRBC: 0 % (ref 0.0–0.2)

## 2024-04-12 LAB — TYPE AND SCREEN: Antibody Screen: NEGATIVE

## 2024-04-12 LAB — PREPARE RBC (CROSSMATCH)

## 2024-04-12 LAB — SAMPLE TO BLOOD BANK

## 2024-04-12 MED ORDER — SODIUM CHLORIDE 0.9% IV SOLUTION
250.0000 mL | INTRAVENOUS | Status: DC
  Administered 2024-04-12: 100 mL via INTRAVENOUS

## 2024-04-12 MED ORDER — SODIUM CHLORIDE 0.9% FLUSH
10.0000 mL | Freq: Once | INTRAVENOUS | Status: AC
  Administered 2024-04-12: 10 mL

## 2024-04-12 MED ORDER — HEPARIN SOD (PORK) LOCK FLUSH 100 UNIT/ML IV SOLN
500.0000 [IU] | Freq: Every day | INTRAVENOUS | Status: AC | PRN
  Administered 2024-04-12: 500 [IU]

## 2024-04-12 MED ORDER — ACETAMINOPHEN 325 MG PO TABS
650.0000 mg | ORAL_TABLET | Freq: Once | ORAL | Status: AC
  Administered 2024-04-12: 650 mg via ORAL
  Filled 2024-04-12: qty 2

## 2024-04-12 MED ORDER — METHYLPREDNISOLONE SODIUM SUCC 40 MG IJ SOLR
40.0000 mg | Freq: Once | INTRAMUSCULAR | Status: AC
  Administered 2024-04-12: 40 mg via INTRAVENOUS
  Filled 2024-04-12: qty 1

## 2024-04-12 MED ORDER — SODIUM CHLORIDE 0.9% FLUSH
10.0000 mL | INTRAVENOUS | Status: AC | PRN
  Administered 2024-04-12: 10 mL

## 2024-04-13 LAB — TYPE AND SCREEN
ABO/RH(D): A POS
Unit division: 0

## 2024-04-13 LAB — BPAM RBC
Blood Product Expiration Date: 202507202359
ISSUE DATE / TIME: 202506260958
Unit Type and Rh: 6200

## 2024-04-16 DIAGNOSIS — H18413 Arcus senilis, bilateral: Secondary | ICD-10-CM | POA: Diagnosis not present

## 2024-04-16 DIAGNOSIS — E1165 Type 2 diabetes mellitus with hyperglycemia: Secondary | ICD-10-CM | POA: Diagnosis not present

## 2024-04-16 DIAGNOSIS — H2511 Age-related nuclear cataract, right eye: Secondary | ICD-10-CM | POA: Diagnosis not present

## 2024-04-16 DIAGNOSIS — H43812 Vitreous degeneration, left eye: Secondary | ICD-10-CM | POA: Diagnosis not present

## 2024-04-16 DIAGNOSIS — H2513 Age-related nuclear cataract, bilateral: Secondary | ICD-10-CM | POA: Diagnosis not present

## 2024-04-16 DIAGNOSIS — H25013 Cortical age-related cataract, bilateral: Secondary | ICD-10-CM | POA: Diagnosis not present

## 2024-04-18 ENCOUNTER — Other Ambulatory Visit: Payer: Self-pay | Admitting: Hematology

## 2024-04-18 ENCOUNTER — Other Ambulatory Visit (HOSPITAL_COMMUNITY): Payer: Self-pay

## 2024-04-18 DIAGNOSIS — D469 Myelodysplastic syndrome, unspecified: Secondary | ICD-10-CM

## 2024-04-18 MED ORDER — VALACYCLOVIR HCL 500 MG PO TABS
500.0000 mg | ORAL_TABLET | Freq: Two times a day (BID) | ORAL | 5 refills | Status: DC
Start: 2024-04-18 — End: 2024-09-13
  Filled 2024-04-18 – 2024-04-23 (×3): qty 60, 30d supply, fill #0
  Filled 2024-05-25: qty 60, 30d supply, fill #1
  Filled 2024-06-25: qty 60, 30d supply, fill #2
  Filled 2024-07-25: qty 60, 30d supply, fill #3

## 2024-04-19 ENCOUNTER — Inpatient Hospital Stay

## 2024-04-19 ENCOUNTER — Other Ambulatory Visit: Payer: Self-pay

## 2024-04-19 DIAGNOSIS — D469 Myelodysplastic syndrome, unspecified: Secondary | ICD-10-CM

## 2024-04-21 ENCOUNTER — Other Ambulatory Visit: Payer: Self-pay | Admitting: Hematology

## 2024-04-23 ENCOUNTER — Other Ambulatory Visit: Payer: Self-pay

## 2024-04-23 ENCOUNTER — Inpatient Hospital Stay

## 2024-04-23 ENCOUNTER — Other Ambulatory Visit (HOSPITAL_COMMUNITY): Payer: Self-pay

## 2024-04-23 ENCOUNTER — Inpatient Hospital Stay: Attending: Hematology

## 2024-04-23 DIAGNOSIS — D469 Myelodysplastic syndrome, unspecified: Secondary | ICD-10-CM

## 2024-04-23 DIAGNOSIS — D464 Refractory anemia, unspecified: Secondary | ICD-10-CM | POA: Diagnosis not present

## 2024-04-23 DIAGNOSIS — Z79899 Other long term (current) drug therapy: Secondary | ICD-10-CM | POA: Diagnosis not present

## 2024-04-23 LAB — CBC WITH DIFFERENTIAL (CANCER CENTER ONLY)
Abs Immature Granulocytes: 0.12 K/uL — ABNORMAL HIGH (ref 0.00–0.07)
Basophils Absolute: 0.2 K/uL — ABNORMAL HIGH (ref 0.0–0.1)
Basophils Relative: 3 %
Eosinophils Absolute: 0.3 K/uL (ref 0.0–0.5)
Eosinophils Relative: 6 %
HCT: 20.5 % — ABNORMAL LOW (ref 39.0–52.0)
Hemoglobin: 7 g/dL — ABNORMAL LOW (ref 13.0–17.0)
Immature Granulocytes: 2 %
Lymphocytes Relative: 9 %
Lymphs Abs: 0.5 K/uL — ABNORMAL LOW (ref 0.7–4.0)
MCH: 34 pg (ref 26.0–34.0)
MCHC: 34.1 g/dL (ref 30.0–36.0)
MCV: 99.5 fL (ref 80.0–100.0)
Monocytes Absolute: 0.5 K/uL (ref 0.1–1.0)
Monocytes Relative: 10 %
Neutro Abs: 3.6 K/uL (ref 1.7–7.7)
Neutrophils Relative %: 70 %
Platelet Count: 160 K/uL (ref 150–400)
RBC: 2.06 MIL/uL — ABNORMAL LOW (ref 4.22–5.81)
RDW: 20.3 % — ABNORMAL HIGH (ref 11.5–15.5)
WBC Count: 5.1 K/uL (ref 4.0–10.5)
nRBC: 0 % (ref 0.0–0.2)

## 2024-04-23 LAB — SAMPLE TO BLOOD BANK

## 2024-04-23 LAB — PREPARE RBC (CROSSMATCH)

## 2024-04-23 MED ORDER — ACETAMINOPHEN 325 MG PO TABS
650.0000 mg | ORAL_TABLET | Freq: Once | ORAL | Status: AC
  Administered 2024-04-23: 650 mg via ORAL
  Filled 2024-04-23: qty 2

## 2024-04-23 MED ORDER — METHYLPREDNISOLONE SODIUM SUCC 40 MG IJ SOLR
40.0000 mg | Freq: Once | INTRAMUSCULAR | Status: AC
  Administered 2024-04-23: 40 mg via INTRAVENOUS
  Filled 2024-04-23: qty 1

## 2024-04-23 MED ORDER — SODIUM CHLORIDE 0.9% FLUSH
10.0000 mL | Freq: Once | INTRAVENOUS | Status: AC
  Administered 2024-04-23: 10 mL

## 2024-04-23 MED ORDER — SODIUM CHLORIDE 0.9% IV SOLUTION
250.0000 mL | INTRAVENOUS | Status: DC
  Administered 2024-04-23: 100 mL via INTRAVENOUS

## 2024-04-23 NOTE — Patient Instructions (Signed)
 Blood Transfusion, Adult A blood transfusion is a procedure in which you receive blood or a type of blood cell (blood component) through an IV. You may need a blood transfusion when you have a low blood count, which is a low number of any blood cell. This may result from a bleeding disorder, illness, injury, or surgery. The blood may come from a donor, or you may be able to have your own blood collected and stored (autologous blood donation) before a planned surgery. The blood given in a transfusion may be made up of different blood components. You may receive: Red blood cells. These carry oxygen to the cells in the body. Platelets. These help your blood to clot. Plasma. This is the liquid part of your blood. It carries proteins and other substances throughout the body. White blood cells. These help you fight infections. If you have hemophilia or another clotting disorder, you may also receive other types of blood products. Depending on the type of blood product, this procedure may take 1-4 hours to complete. Tell a health care provider about: Any bleeding problems you have. Any previous reactions you have had during a blood transfusion. Any allergies you have. All medicines you are taking, including vitamins, herbs, eye drops, creams, and over-the-counter medicines. Any surgeries you have had. Any medical conditions you have. Whether you are pregnant or may be pregnant. What are the risks? Talk with your health care provider about risks. The most common problems include: A mild allergic reaction, such as red, swollen areas of skin (hives) and itching. Fever or chills. This may be the body's response to new blood cells received. This may occur during or up to 4 hours after the transfusion. More serious problems may include: A serious allergic reaction that causes difficulty breathing or swelling around the face and lips. Transfusion-associated circulatory overload (TACO), or too much fluid in  the lungs. This may cause breathing problems. Transfusion-related acute lung injury (TRALI), which causes breathing difficulty and low oxygen in the blood. This can occur within hours of the transfusion or several days later. Iron overload. This can happen after receiving many blood transfusions over a period of time. Infection or virus being transmitted. This is rare because donated blood is carefully tested before it is given. Hemolytic transfusion reaction. This is rare. It happens when the body's defense system (immune system)tries to attack the new blood cells. Symptoms may include fever, chills, nausea, low blood pressure, and low back or chest pain. Transfusion-associated graft-versus-host disease (TAGVHD). This is rare. It happens when donated cells attack the body's healthy tissues. What happens before the procedure? You will have a blood test to check your blood type. This test is done to know what kind of blood your body will accept and to match it to the donor blood. If you are going to have a planned surgery, you may be able to do an autologous blood donation. This may be done in case you need to have a transfusion. You will have your temperature, blood pressure, and pulse checked before the transfusion. If you have had an allergic reaction to a transfusion in the past, you may be given medicine to help prevent a reaction. This medicine may be given to you by mouth (orally) or through an IV. What happens during the procedure?  An IV will be inserted into one of your veins. The bag of blood will be attached to your IV. The blood will then enter through your vein. Your temperature, blood pressure,  and pulse will be monitored during the transfusion. This monitoring is done to detect early signs of a transfusion reaction. Tell your nurse right away if you have any of these symptoms during the transfusion: Shortness of breath or trouble breathing. Chest or back pain. Fever or  chills. Itching or hives. If you have any signs or symptoms of a reaction, your transfusion will be stopped and you may be given medicine. When the transfusion is complete, your IV will be removed. Pressure may be applied to the IV site for a few minutes. A bandage (dressing)will be applied. The procedure may vary among health care providers and hospitals. What happens after the procedure? Your temperature, blood pressure, pulse, breathing rate, and blood oxygen level will be monitored until you leave the hospital or clinic. Your blood may be tested to see how you have responded to the transfusion. You may be warmed with fluids or blankets to maintain a normal body temperature. If you receive your blood transfusion in an outpatient setting, you will be told whom to contact to report any reactions. Where to find more information Visit the [employer]: redcross.org Summary A blood transfusion is a procedure in which you receive blood or a type of blood cell (blood component) through an IV. The blood given in a transfusion may be made up of different blood components. You may receive red blood cells, platelets, plasma, or white blood cells depending on the condition treated. Your temperature, blood pressure, and pulse will be monitored before, during, and after the transfusion. After the transfusion, your blood may be tested to see how your body has responded. This information is not intended to replace advice given to you by your health care provider. Make sure you discuss any questions you have with your health care provider. Document Revised: 01/01/2022 Document Reviewed: 01/01/2022 Elsevier Patient Education  2024 [employer].

## 2024-04-23 NOTE — Progress Notes (Signed)
 Specialty Pharmacy Ongoing Clinical Assessment Note  ESTEL SCHOLZE is a 78 y.o. male who is being followed by the specialty pharmacy service for RxSp Oncology   Patient's specialty medication(s) reviewed today: Deferasirox ; Pacritinib  Citrate (Vonjo )   Missed doses in the last 4 weeks: 0   Patient/Caregiver did not have any additional questions or concerns.   Therapeutic benefit summary: Patient is achieving benefit   Adverse events/side effects summary: No adverse events/side effects   Patient's therapy is appropriate to: Continue    Goals Addressed             This Visit's Progress    Laboratory improvement       For Jadenu , Patient is unable to be assessed as therapy was recently initiated. Patient will maintain adherence. Patient to have repeat iron  labs done next week.      Slow Disease Progression   On track    Patient is on track. Patient will maintain adherence. Per visit on 03/28/24, WBC, hemoglobin and platelets are stable. Although patient has not had iron  labs checked since starting Jadenu , he reports that he is feeling better and less fatigued.           Follow up: 3 months  [facility]

## 2024-04-23 NOTE — Progress Notes (Signed)
 Patient here for labs.  Requested his Iron  levels checked.  States he has been taking Iron  medication for three weeks.  Secure chatted Beth Wright/RN.  She states he's only been taking medication for three weeks, and she felt it was too soon.  Advised her to call and inform the patient, and that I would inform lab to disregard the tubes.  She states ok

## 2024-04-23 NOTE — Progress Notes (Signed)
 Specialty Pharmacy Refill Coordination Note  Fernando Lee is a 78 y.o. male contacted today regarding refills of specialty medication(s) Deferasirox    Patient requested [name] at [facility] at [place] date: 04/30/24   Medication will be filled on 04/27/24.

## 2024-04-24 ENCOUNTER — Encounter: Payer: Self-pay | Admitting: Hematology

## 2024-04-24 LAB — BPAM RBC
Blood Product Expiration Date: 202508022359
ISSUE DATE / TIME: 202507071032
Unit Type and Rh: 6200

## 2024-04-24 LAB — TYPE AND SCREEN
ABO/RH(D): A POS
Antibody Screen: NEGATIVE
Unit division: 0

## 2024-04-26 ENCOUNTER — Inpatient Hospital Stay

## 2024-04-27 ENCOUNTER — Other Ambulatory Visit: Payer: Self-pay

## 2024-04-27 DIAGNOSIS — D469 Myelodysplastic syndrome, unspecified: Secondary | ICD-10-CM

## 2024-04-30 ENCOUNTER — Inpatient Hospital Stay

## 2024-04-30 ENCOUNTER — Telehealth: Payer: Self-pay

## 2024-04-30 DIAGNOSIS — D469 Myelodysplastic syndrome, unspecified: Secondary | ICD-10-CM

## 2024-04-30 DIAGNOSIS — Z79899 Other long term (current) drug therapy: Secondary | ICD-10-CM | POA: Diagnosis not present

## 2024-04-30 DIAGNOSIS — D464 Refractory anemia, unspecified: Secondary | ICD-10-CM | POA: Diagnosis not present

## 2024-04-30 LAB — CBC WITH DIFFERENTIAL (CANCER CENTER ONLY)
Abs Immature Granulocytes: 0.1 K/uL — ABNORMAL HIGH (ref 0.00–0.07)
Basophils Absolute: 0.2 K/uL — ABNORMAL HIGH (ref 0.0–0.1)
Basophils Relative: 3 %
Eosinophils Absolute: 0.2 K/uL (ref 0.0–0.5)
Eosinophils Relative: 5 %
HCT: 19.9 % — ABNORMAL LOW (ref 39.0–52.0)
Hemoglobin: 6.7 g/dL — CL (ref 13.0–17.0)
Immature Granulocytes: 2 %
Lymphocytes Relative: 11 %
Lymphs Abs: 0.5 K/uL — ABNORMAL LOW (ref 0.7–4.0)
MCH: 33.3 pg (ref 26.0–34.0)
MCHC: 33.7 g/dL (ref 30.0–36.0)
MCV: 99 fL (ref 80.0–100.0)
Monocytes Absolute: 0.5 K/uL (ref 0.1–1.0)
Monocytes Relative: 10 %
Neutro Abs: 3.1 K/uL (ref 1.7–7.7)
Neutrophils Relative %: 69 %
Platelet Count: 147 K/uL — ABNORMAL LOW (ref 150–400)
RBC: 2.01 MIL/uL — ABNORMAL LOW (ref 4.22–5.81)
RDW: 19.3 % — ABNORMAL HIGH (ref 11.5–15.5)
WBC Count: 4.5 K/uL (ref 4.0–10.5)
nRBC: 0 % (ref 0.0–0.2)

## 2024-04-30 LAB — SAMPLE TO BLOOD BANK

## 2024-04-30 LAB — PREPARE RBC (CROSSMATCH)

## 2024-04-30 MED ORDER — METHYLPREDNISOLONE SODIUM SUCC 40 MG IJ SOLR
40.0000 mg | Freq: Once | INTRAMUSCULAR | Status: AC
  Administered 2024-04-30: 40 mg via INTRAVENOUS
  Filled 2024-04-30: qty 1

## 2024-04-30 MED ORDER — SODIUM CHLORIDE 0.9% IV SOLUTION
250.0000 mL | INTRAVENOUS | Status: DC
  Administered 2024-04-30: 100 mL via INTRAVENOUS

## 2024-04-30 MED ORDER — SODIUM CHLORIDE 0.9% FLUSH
10.0000 mL | Freq: Once | INTRAVENOUS | Status: AC
  Administered 2024-04-30: 10 mL

## 2024-04-30 MED ORDER — ACETAMINOPHEN 325 MG PO TABS
650.0000 mg | ORAL_TABLET | Freq: Once | ORAL | Status: AC
  Administered 2024-04-30: 650 mg via ORAL
  Filled 2024-04-30: qty 2

## 2024-04-30 NOTE — Patient Instructions (Signed)
 Blood Transfusion, Adult A blood transfusion is a procedure in which you receive blood or a type of blood cell (blood component) through an IV. You may need a blood transfusion when you have a low blood count, which is a low number of any blood cell. This may result from a bleeding disorder, illness, injury, or surgery. The blood may come from a donor, or you may be able to have your own blood collected and stored (autologous blood donation) before a planned surgery. The blood given in a transfusion may be made up of different blood components. You may receive: Red blood cells. These carry oxygen to the cells in the body. Platelets. These help your blood to clot. Plasma. This is the liquid part of your blood. It carries proteins and other substances throughout the body. White blood cells. These help you fight infections. If you have hemophilia or another clotting disorder, you may also receive other types of blood products. Depending on the type of blood product, this procedure may take 1-4 hours to complete. Tell a health care provider about: Any bleeding problems you have. Any previous reactions you have had during a blood transfusion. Any allergies you have. All medicines you are taking, including vitamins, herbs, eye drops, creams, and over-the-counter medicines. Any surgeries you have had. Any medical conditions you have. Whether you are pregnant or may be pregnant. What are the risks? Talk with your health care provider about risks. The most common problems include: A mild allergic reaction, such as red, swollen areas of skin (hives) and itching. Fever or chills. This may be the body's response to new blood cells received. This may occur during or up to 4 hours after the transfusion. More serious problems may include: A serious allergic reaction that causes difficulty breathing or swelling around the face and lips. Transfusion-associated circulatory overload (TACO), or too much fluid in  the lungs. This may cause breathing problems. Transfusion-related acute lung injury (TRALI), which causes breathing difficulty and low oxygen in the blood. This can occur within hours of the transfusion or several days later. Iron overload. This can happen after receiving many blood transfusions over a period of time. Infection or virus being transmitted. This is rare because donated blood is carefully tested before it is given. Hemolytic transfusion reaction. This is rare. It happens when the body's defense system (immune system)tries to attack the new blood cells. Symptoms may include fever, chills, nausea, low blood pressure, and low back or chest pain. Transfusion-associated graft-versus-host disease (TAGVHD). This is rare. It happens when donated cells attack the body's healthy tissues. What happens before the procedure? You will have a blood test to check your blood type. This test is done to know what kind of blood your body will accept and to match it to the donor blood. If you are going to have a planned surgery, you may be able to do an autologous blood donation. This may be done in case you need to have a transfusion. You will have your temperature, blood pressure, and pulse checked before the transfusion. If you have had an allergic reaction to a transfusion in the past, you may be given medicine to help prevent a reaction. This medicine may be given to you by mouth (orally) or through an IV. What happens during the procedure?  An IV will be inserted into one of your veins. The bag of blood will be attached to your IV. The blood will then enter through your vein. Your temperature, blood pressure,  and pulse will be monitored during the transfusion. This monitoring is done to detect early signs of a transfusion reaction. Tell your nurse right away if you have any of these symptoms during the transfusion: Shortness of breath or trouble breathing. Chest or back pain. Fever or  chills. Itching or hives. If you have any signs or symptoms of a reaction, your transfusion will be stopped and you may be given medicine. When the transfusion is complete, your IV will be removed. Pressure may be applied to the IV site for a few minutes. A bandage (dressing)will be applied. The procedure may vary among health care providers and hospitals. What happens after the procedure? Your temperature, blood pressure, pulse, breathing rate, and blood oxygen level will be monitored until you leave the hospital or clinic. Your blood may be tested to see how you have responded to the transfusion. You may be warmed with fluids or blankets to maintain a normal body temperature. If you receive your blood transfusion in an outpatient setting, you will be told whom to contact to report any reactions. Where to find more information Visit the [employer]: redcross.org Summary A blood transfusion is a procedure in which you receive blood or a type of blood cell (blood component) through an IV. The blood given in a transfusion may be made up of different blood components. You may receive red blood cells, platelets, plasma, or white blood cells depending on the condition treated. Your temperature, blood pressure, and pulse will be monitored before, during, and after the transfusion. After the transfusion, your blood may be tested to see how your body has responded. This information is not intended to replace advice given to you by your health care provider. Make sure you discuss any questions you have with your health care provider. Document Revised: 01/01/2022 Document Reviewed: 01/01/2022 Elsevier Patient Education  2024 [employer].

## 2024-04-30 NOTE — Telephone Encounter (Signed)
 CRITICAL VALUE STICKER  CRITICAL VALUE:  Hgb 6.7  RECEIVER (on-site recipient of call): Sherrilyn Sobers, LPN  DATE & TIME NOTIFIED: 08:24  04/30/2024  MESSENGER (representative from lab): Pam  MD NOTIFIED: JUDITHANN Saran, MD  TIME OF NOTIFICATION: 08:25  RESPONSE:

## 2024-05-01 ENCOUNTER — Other Ambulatory Visit: Payer: Self-pay

## 2024-05-01 DIAGNOSIS — D469 Myelodysplastic syndrome, unspecified: Secondary | ICD-10-CM

## 2024-05-01 LAB — TYPE AND SCREEN
ABO/RH(D): A POS
Antibody Screen: NEGATIVE
Unit division: 0

## 2024-05-01 LAB — BPAM RBC
Blood Product Expiration Date: 202508092359
ISSUE DATE / TIME: 202507140921
Unit Type and Rh: 6200

## 2024-05-03 ENCOUNTER — Inpatient Hospital Stay

## 2024-05-04 ENCOUNTER — Inpatient Hospital Stay

## 2024-05-04 ENCOUNTER — Other Ambulatory Visit: Payer: Self-pay

## 2024-05-04 DIAGNOSIS — D469 Myelodysplastic syndrome, unspecified: Secondary | ICD-10-CM

## 2024-05-04 DIAGNOSIS — D464 Refractory anemia, unspecified: Secondary | ICD-10-CM | POA: Diagnosis not present

## 2024-05-04 DIAGNOSIS — Z79899 Other long term (current) drug therapy: Secondary | ICD-10-CM | POA: Diagnosis not present

## 2024-05-04 LAB — CBC WITH DIFFERENTIAL (CANCER CENTER ONLY)
Abs Immature Granulocytes: 0.17 K/uL — ABNORMAL HIGH (ref 0.00–0.07)
Basophils Absolute: 0.2 K/uL — ABNORMAL HIGH (ref 0.0–0.1)
Basophils Relative: 3 %
Eosinophils Absolute: 0.2 K/uL (ref 0.0–0.5)
Eosinophils Relative: 5 %
HCT: 22.2 % — ABNORMAL LOW (ref 39.0–52.0)
Hemoglobin: 7.3 g/dL — ABNORMAL LOW (ref 13.0–17.0)
Immature Granulocytes: 4 %
Lymphocytes Relative: 9 %
Lymphs Abs: 0.4 K/uL — ABNORMAL LOW (ref 0.7–4.0)
MCH: 32.6 pg (ref 26.0–34.0)
MCHC: 32.9 g/dL (ref 30.0–36.0)
MCV: 99.1 fL (ref 80.0–100.0)
Monocytes Absolute: 0.5 K/uL (ref 0.1–1.0)
Monocytes Relative: 10 %
Neutro Abs: 3.2 K/uL (ref 1.7–7.7)
Neutrophils Relative %: 69 %
Platelet Count: 138 K/uL — ABNORMAL LOW (ref 150–400)
RBC: 2.24 MIL/uL — ABNORMAL LOW (ref 4.22–5.81)
RDW: 18.1 % — ABNORMAL HIGH (ref 11.5–15.5)
WBC Count: 4.6 K/uL (ref 4.0–10.5)
nRBC: 0 % (ref 0.0–0.2)

## 2024-05-04 LAB — PREPARE RBC (CROSSMATCH)

## 2024-05-04 MED ORDER — SODIUM CHLORIDE 0.9% IV SOLUTION
250.0000 mL | INTRAVENOUS | Status: DC
  Administered 2024-05-04: 250 mL via INTRAVENOUS

## 2024-05-04 MED ORDER — SODIUM CHLORIDE 0.9% FLUSH: 3.0000 mL | INTRAVENOUS | Status: DC | PRN

## 2024-05-04 MED ORDER — SODIUM CHLORIDE 0.9% FLUSH
10.0000 mL | INTRAVENOUS | Status: AC | PRN
  Administered 2024-05-04: 10 mL

## 2024-05-04 MED ORDER — SODIUM CHLORIDE 0.9% FLUSH
10.0000 mL | Freq: Once | INTRAVENOUS | Status: AC
  Administered 2024-05-04: 10 mL

## 2024-05-04 MED ORDER — METHYLPREDNISOLONE SODIUM SUCC 40 MG IJ SOLR
40.0000 mg | Freq: Once | INTRAMUSCULAR | Status: AC
  Administered 2024-05-04: 40 mg via INTRAVENOUS
  Filled 2024-05-04: qty 1

## 2024-05-04 MED ORDER — HEPARIN SOD (PORK) LOCK FLUSH 100 UNIT/ML IV SOLN
500.0000 [IU] | Freq: Every day | INTRAVENOUS | Status: AC | PRN
  Administered 2024-05-04: 500 [IU]

## 2024-05-04 MED ORDER — HEPARIN SOD (PORK) LOCK FLUSH 100 UNIT/ML IV SOLN: 250.0000 [IU] | INTRAVENOUS | Status: DC | PRN

## 2024-05-04 MED ORDER — ACETAMINOPHEN 325 MG PO TABS
650.0000 mg | ORAL_TABLET | Freq: Once | ORAL | Status: AC
  Administered 2024-05-04: 650 mg via ORAL
  Filled 2024-05-04: qty 2

## 2024-05-04 NOTE — Patient Instructions (Signed)
 Blood Transfusion, Adult A blood transfusion is a procedure in which you receive blood or a type of blood cell (blood component) through an IV. You may need a blood transfusion when you have a low blood count, which is a low number of any blood cell. This may result from a bleeding disorder, illness, injury, or surgery. The blood may come from a donor, or you may be able to have your own blood collected and stored (autologous blood donation) before a planned surgery. The blood given in a transfusion may be made up of different blood components. You may receive: Red blood cells. These carry oxygen to the cells in the body. Platelets. These help your blood to clot. Plasma. This is the liquid part of your blood. It carries proteins and other substances throughout the body. White blood cells. These help you fight infections. If you have hemophilia or another clotting disorder, you may also receive other types of blood products. Depending on the type of blood product, this procedure may take 1-4 hours to complete. Tell a health care provider about: Any bleeding problems you have. Any previous reactions you have had during a blood transfusion. Any allergies you have. All medicines you are taking, including vitamins, herbs, eye drops, creams, and over-the-counter medicines. Any surgeries you have had. Any medical conditions you have. Whether you are pregnant or may be pregnant. What are the risks? Talk with your health care provider about risks. The most common problems include: A mild allergic reaction, such as red, swollen areas of skin (hives) and itching. Fever or chills. This may be the body's response to new blood cells received. This may occur during or up to 4 hours after the transfusion. More serious problems may include: A serious allergic reaction that causes difficulty breathing or swelling around the face and lips. Transfusion-associated circulatory overload (TACO), or too much fluid in  the lungs. This may cause breathing problems. Transfusion-related acute lung injury (TRALI), which causes breathing difficulty and low oxygen in the blood. This can occur within hours of the transfusion or several days later. Iron overload. This can happen after receiving many blood transfusions over a period of time. Infection or virus being transmitted. This is rare because donated blood is carefully tested before it is given. Hemolytic transfusion reaction. This is rare. It happens when the body's defense system (immune system)tries to attack the new blood cells. Symptoms may include fever, chills, nausea, low blood pressure, and low back or chest pain. Transfusion-associated graft-versus-host disease (TAGVHD). This is rare. It happens when donated cells attack the body's healthy tissues. What happens before the procedure? You will have a blood test to check your blood type. This test is done to know what kind of blood your body will accept and to match it to the donor blood. If you are going to have a planned surgery, you may be able to do an autologous blood donation. This may be done in case you need to have a transfusion. You will have your temperature, blood pressure, and pulse checked before the transfusion. If you have had an allergic reaction to a transfusion in the past, you may be given medicine to help prevent a reaction. This medicine may be given to you by mouth (orally) or through an IV. What happens during the procedure?  An IV will be inserted into one of your veins. The bag of blood will be attached to your IV. The blood will then enter through your vein. Your temperature, blood pressure,  and pulse will be monitored during the transfusion. This monitoring is done to detect early signs of a transfusion reaction. Tell your nurse right away if you have any of these symptoms during the transfusion: Shortness of breath or trouble breathing. Chest or back pain. Fever or  chills. Itching or hives. If you have any signs or symptoms of a reaction, your transfusion will be stopped and you may be given medicine. When the transfusion is complete, your IV will be removed. Pressure may be applied to the IV site for a few minutes. A bandage (dressing)will be applied. The procedure may vary among health care providers and hospitals. What happens after the procedure? Your temperature, blood pressure, pulse, breathing rate, and blood oxygen level will be monitored until you leave the hospital or clinic. Your blood may be tested to see how you have responded to the transfusion. You may be warmed with fluids or blankets to maintain a normal body temperature. If you receive your blood transfusion in an outpatient setting, you will be told whom to contact to report any reactions. Where to find more information Visit the [employer]: redcross.org Summary A blood transfusion is a procedure in which you receive blood or a type of blood cell (blood component) through an IV. The blood given in a transfusion may be made up of different blood components. You may receive red blood cells, platelets, plasma, or white blood cells depending on the condition treated. Your temperature, blood pressure, and pulse will be monitored before, during, and after the transfusion. After the transfusion, your blood may be tested to see how your body has responded. This information is not intended to replace advice given to you by your health care provider. Make sure you discuss any questions you have with your health care provider. Document Revised: 01/01/2022 Document Reviewed: 01/01/2022 Elsevier Patient Education  2024 [employer].

## 2024-05-07 LAB — BPAM RBC
Blood Product Expiration Date: 202508102359
ISSUE DATE / TIME: 202507181014
Unit Type and Rh: 6200

## 2024-05-07 LAB — TYPE AND SCREEN
ABO/RH(D): A POS
Antibody Screen: NEGATIVE
Unit division: 0

## 2024-05-09 ENCOUNTER — Other Ambulatory Visit: Payer: Self-pay

## 2024-05-09 ENCOUNTER — Other Ambulatory Visit (HOSPITAL_COMMUNITY): Payer: Self-pay

## 2024-05-09 ENCOUNTER — Other Ambulatory Visit: Payer: Self-pay | Admitting: Hematology

## 2024-05-09 DIAGNOSIS — D471 Chronic myeloproliferative disease: Secondary | ICD-10-CM

## 2024-05-09 DIAGNOSIS — D469 Myelodysplastic syndrome, unspecified: Secondary | ICD-10-CM

## 2024-05-09 MED ORDER — VONJO 100 MG PO CAPS
200.0000 mg | ORAL_CAPSULE | Freq: Two times a day (BID) | ORAL | 2 refills | Status: DC
  Filled 2024-05-09: qty 120, 30d supply, fill #0
  Filled 2024-06-06: qty 120, 30d supply, fill #1
  Filled 2024-07-04: qty 120, 30d supply, fill #2

## 2024-05-09 NOTE — Progress Notes (Signed)
 Specialty Pharmacy Refill Coordination Note  Spoke with [name] is a 78 y.o. male contacted today regarding refills of specialty medication(s) Pacritinib  Citrate (Vonjo )  Doses on hand: 8 days   Patient requested: Pickup at [facility] at [place] date: 05/11/24  Medication will be filled on 05/10/24.  This fill date is pending response to refill request from provider. Patient is aware and if they have not received fill by intended date, they must follow up with pharmacy.

## 2024-05-10 ENCOUNTER — Inpatient Hospital Stay

## 2024-05-10 ENCOUNTER — Other Ambulatory Visit: Payer: Self-pay

## 2024-05-10 ENCOUNTER — Other Ambulatory Visit (HOSPITAL_COMMUNITY): Payer: Self-pay

## 2024-05-10 DIAGNOSIS — Z79899 Other long term (current) drug therapy: Secondary | ICD-10-CM | POA: Diagnosis not present

## 2024-05-10 DIAGNOSIS — D649 Anemia, unspecified: Secondary | ICD-10-CM

## 2024-05-10 DIAGNOSIS — D469 Myelodysplastic syndrome, unspecified: Secondary | ICD-10-CM

## 2024-05-10 DIAGNOSIS — D464 Refractory anemia, unspecified: Secondary | ICD-10-CM | POA: Diagnosis not present

## 2024-05-10 LAB — CBC WITH DIFFERENTIAL (CANCER CENTER ONLY)
Abs Immature Granulocytes: 0.12 K/uL — ABNORMAL HIGH (ref 0.00–0.07)
Basophils Absolute: 0.1 K/uL (ref 0.0–0.1)
Basophils Relative: 3 %
Eosinophils Absolute: 0.2 K/uL (ref 0.0–0.5)
Eosinophils Relative: 5 %
HCT: 21.3 % — ABNORMAL LOW (ref 39.0–52.0)
Hemoglobin: 7.1 g/dL — ABNORMAL LOW (ref 13.0–17.0)
Immature Granulocytes: 3 %
Lymphocytes Relative: 9 %
Lymphs Abs: 0.4 K/uL — ABNORMAL LOW (ref 0.7–4.0)
MCH: 33 pg (ref 26.0–34.0)
MCHC: 33.3 g/dL (ref 30.0–36.0)
MCV: 99.1 fL (ref 80.0–100.0)
Monocytes Absolute: 0.4 K/uL (ref 0.1–1.0)
Monocytes Relative: 10 %
Neutro Abs: 3.1 K/uL (ref 1.7–7.7)
Neutrophils Relative %: 70 %
Platelet Count: 138 K/uL — ABNORMAL LOW (ref 150–400)
RBC: 2.15 MIL/uL — ABNORMAL LOW (ref 4.22–5.81)
RDW: 18.1 % — ABNORMAL HIGH (ref 11.5–15.5)
WBC Count: 4.5 K/uL (ref 4.0–10.5)
nRBC: 0 % (ref 0.0–0.2)

## 2024-05-10 LAB — SAMPLE TO BLOOD BANK

## 2024-05-10 LAB — PREPARE RBC (CROSSMATCH)

## 2024-05-10 MED ORDER — SODIUM CHLORIDE 0.9% FLUSH
10.0000 mL | Freq: Once | INTRAVENOUS | Status: AC
  Administered 2024-05-10: 10 mL

## 2024-05-10 MED ORDER — METHYLPREDNISOLONE SODIUM SUCC 40 MG IJ SOLR
40.0000 mg | Freq: Once | INTRAMUSCULAR | Status: AC
  Administered 2024-05-10: 40 mg via INTRAVENOUS
  Filled 2024-05-10: qty 1

## 2024-05-10 MED ORDER — SODIUM CHLORIDE 0.9% FLUSH
3.0000 mL | INTRAVENOUS | Status: AC | PRN
  Administered 2024-05-10: 3 mL

## 2024-05-10 MED ORDER — ACETAMINOPHEN 325 MG PO TABS
650.0000 mg | ORAL_TABLET | Freq: Once | ORAL | Status: AC
  Administered 2024-05-10: 650 mg via ORAL
  Filled 2024-05-10: qty 2

## 2024-05-10 MED ORDER — HEPARIN SOD (PORK) LOCK FLUSH 100 UNIT/ML IV SOLN
250.0000 [IU] | INTRAVENOUS | Status: AC | PRN
  Administered 2024-05-10: 250 [IU]

## 2024-05-10 MED ORDER — SODIUM CHLORIDE 0.9% IV SOLUTION
250.0000 mL | INTRAVENOUS | Status: DC
  Administered 2024-05-10: 100 mL via INTRAVENOUS

## 2024-05-10 NOTE — Patient Instructions (Signed)

## 2024-05-11 LAB — BPAM RBC
Blood Product Expiration Date: 202508202359
ISSUE DATE / TIME: 202507241001
Unit Type and Rh: 6200

## 2024-05-11 LAB — TYPE AND SCREEN
ABO/RH(D): A POS
Antibody Screen: NEGATIVE
Unit division: 0

## 2024-05-15 DIAGNOSIS — H25012 Cortical age-related cataract, left eye: Secondary | ICD-10-CM | POA: Diagnosis not present

## 2024-05-15 DIAGNOSIS — H2512 Age-related nuclear cataract, left eye: Secondary | ICD-10-CM | POA: Diagnosis not present

## 2024-05-15 DIAGNOSIS — H2511 Age-related nuclear cataract, right eye: Secondary | ICD-10-CM | POA: Diagnosis not present

## 2024-05-15 DIAGNOSIS — H268 Other specified cataract: Secondary | ICD-10-CM | POA: Diagnosis not present

## 2024-05-15 DIAGNOSIS — H43812 Vitreous degeneration, left eye: Secondary | ICD-10-CM | POA: Diagnosis not present

## 2024-05-15 DIAGNOSIS — K219 Gastro-esophageal reflux disease without esophagitis: Secondary | ICD-10-CM | POA: Diagnosis not present

## 2024-05-17 ENCOUNTER — Inpatient Hospital Stay

## 2024-05-17 ENCOUNTER — Other Ambulatory Visit: Payer: Self-pay

## 2024-05-17 DIAGNOSIS — D469 Myelodysplastic syndrome, unspecified: Secondary | ICD-10-CM

## 2024-05-17 DIAGNOSIS — E1165 Type 2 diabetes mellitus with hyperglycemia: Secondary | ICD-10-CM | POA: Diagnosis not present

## 2024-05-17 NOTE — Progress Notes (Signed)
 HEMATOLOGY/ONCOLOGY PROGRESS NOTE:   Date of Service: 05/18/2024  Patient Care Team: Dayna Motto, DO as PCP - General (Family Medicine)  CHIEF COMPLAINTS:  Follow-up for continued evaluation and management of JAK2 positive myeloproliferative neoplasm/MDS  INTERVAL HISTORY:  [name] is a 78 y.o. male here for continued evaluation and management of his MPN/MDS with refractory anemia and thrombocytosis.   Patient was last seen by me on 03/28/2024 and complained of some urinary issues with hx of enlarged prostate, managed with Alfuzosin . He noted an incident of prior bladder pain attributed to drinking a fair amount of lemonade, which had resolved. Patient also reported some increased personal stress.  Patient was seen by Dr. Reyna on 04/02/2024.  He is accompanied by his wife during today's visit. Patient reports no new concerns since his last clinical visit.   Patient is tolerating Vonjo  200 mg po BID with no major toxicity issues.   He complains of fatigue. Patient denies any chest pain or SOB.   Patient is currently taking Jadenu  180MG  one tablet daily.   Patient complains of wobbling while walking which he believes is due to balance issues. His wobbliness does improve after transfusion.   Patient is currently in the process of having his cataracts removed surgically.  He notes that he had right eye cataract surgery on Tuesday, 05/15/2024, which went fairly well. He is currently on a schedule of eyedrops.   His wife notes that patient has mild discomfort in the abdomen sometimes. He denies any abdominal pain or leg swelling at this time.   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   Internal hemorrhoids    Kidney stones    Male erectile dysfunction, unspecified    Mixed dyslipidemia    Mixed hyperlipidemia    Ocular migraine    PAC (premature atrial contraction)    Personal history of colonic polyps    Prediabetes    Residual hemorrhoidal skin tags    Spleen enlarged    nov 2023 hospitalized   Unspecified hemorrhoids     SURGICAL HISTORY: Past Surgical History:  Procedure Laterality Date   BONE MARROW BIOPSY     IR IMAGING GUIDED PORT INSERTION  09/14/2022   KIDNEY STONE SURGERY     retrieval    SOCIAL HISTORY: Social History   Socioeconomic History   Marital status: Married    Spouse name: Not on file   Number of children: Not on file   Years of education: Not on file   Highest education level: Not on file  Occupational History   Not on file  Tobacco Use   Smoking status: Former    Types: Cigarettes   Smokeless tobacco: Never  Substance and Sexual Activity   Alcohol use: Yes    Alcohol/week: 1.0 standard drink of alcohol    Types: 1 Cans of beer per week   Drug use: Not on file   Sexual activity: Not on file  Other Topics Concern   Not on file  Social History Narrative   Not on file   Social Drivers  of Health   Financial Resource Strain: Not on file  Food Insecurity: Patient Declined (09/16/2023)   Hunger Vital Sign    Worried About Running Out of Food in the Last Year: Patient declined    Ran Out of Food in the Last Year: Patient declined  Transportation Needs: Patient Declined (09/16/2023)   PRAPARE - Administrator, Civil Service (Medical): Patient declined    Lack of Transportation (Non-Medical): Patient declined  Physical Activity: Not on file  Stress: Not on file  Social Connections: Not on file  Intimate Partner Violence: Patient Declined (09/16/2023)   Humiliation, Afraid, Rape, and Kick questionnaire    Fear of Current or Ex-Partner: Patient declined    Emotionally Abused:  Patient declined    Physically Abused: Patient declined    Sexually Abused: Patient declined    FAMILY HISTORY: Family History  Problem Relation Age of Onset   Stroke Brother    Congestive Heart Failure Brother     ALLERGIES:  is allergic to lactose intolerance (gi).  MEDICATIONS:  Current Outpatient Medications  Medication Sig Dispense Refill   potassium chloride  SA (KLOR-CON  M) 20 MEQ tablet Take 1 tablet (20 mEq total) by mouth 2 (two) times daily. 30 tablet 2   acetaminophen  (TYLENOL ) 325 MG tablet Take 2 tablets (650 mg total) by mouth every 6 (six) hours as needed for mild pain (pain score 1-3) (or Fever >/= 101). 30 tablet 0   alfuzosin  (UROXATRAL ) 10 MG 24 hr tablet Take 10 mg by mouth daily with breakfast.     B Complex Vitamins (B COMPLEX PO) Take 1 tablet by mouth daily.     Cholecalciferol  (VITAMIN D3) 50 MCG (2000 UT) TABS Take 2,000 Units by mouth daily.     Deferasirox  (JADENU ) 180 MG TABS Take 2 tablets (360 mg total) by mouth daily. Take on an empty stomach. 60 tablet 2   docusate sodium  (COLACE) 100 MG capsule Take 100 mg by mouth 2 (two) times daily.     fish oil-omega-3 fatty acids  1000 MG capsule Take 1 g by mouth daily.     furosemide  (LASIX ) 20 MG tablet Take 1 tablet by mouth daily 30 tablet 0   GLUCOSAMINE CHONDROITIN MSM PO Take 1 tablet by mouth daily.     Multiple Vitamin (MULTIVITAMIN) tablet Take 1 tablet by mouth daily with breakfast.     pacritinib  citrate (VONJO ) 100 MG capsule Take 2 capsules (200 mg total) by mouth 2 (two) times daily. 120 capsule 2   Turmeric (QC TUMERIC COMPLEX PO) Take 750 mg by mouth daily.     valACYclovir  (VALTREX ) 500 MG tablet Take 1 tablet (500 mg total) by mouth 2 (two) times daily. 60 tablet 5   vitamin C  (ASCORBIC ACID ) 250 MG tablet Take 500 mg by mouth daily.     No current facility-administered medications for this visit.    REVIEW OF SYSTEMS:    10 Point review of Systems was done is negative except as noted  above.   PHYSICAL EXAMINATION: .BP 105/66   Pulse (!) 59   Temp 97.7 F (36.5 C)   Resp 20   Wt 159 lb 12.8 oz (72.5 kg)   SpO2 98%   BMI 25.03 kg/m   GENERAL:alert, in no acute distress and comfortable SKIN: no acute rashes, no significant lesions EYES: conjunctiva are pink and non-injected, sclera anicteric OROPHARYNX: MMM, no exudates, no oropharyngeal erythema or ulceration NECK: supple, no JVD LYMPH:  no palpable lymphadenopathy in the  cervical, axillary or inguinal regions LUNGS: clear to auscultation b/l with normal respiratory effort HEART: regular rate & rhythm ABDOMEN:  normoactive bowel sounds , non tender, not distended. Extremity: no pedal edema PSYCH: alert & oriented x 3 with fluent speech NEURO: no focal motor/sensory deficits   LABORATORY DATA:  I have reviewed the data as listed .    Latest Ref Rng & Units 05/24/2024    7:47 AM 05/18/2024   11:06 AM 05/10/2024    7:58 AM  CBC  WBC 4.0 - 10.5 K/uL 5.0  5.8  4.5   Hemoglobin 13.0 - 17.0 g/dL 7.4  7.8  7.1   Hematocrit 39.0 - 52.0 % 22.3  23.4  21.3   Platelets 150 - 400 K/uL 160  145  138      Iron /TIBC/Ferritin/ %Sat    Component Value Date/Time   IRON  178 03/28/2024 1446   TIBC 183 (L) 03/28/2024 1446   FERRITIN 5,406 (H) 03/28/2024 1446   IRONPCTSAT 97 (H) 03/28/2024 1446      Latest Ref Rng & Units 05/24/2024    7:47 AM 04/05/2024    8:39 AM 03/28/2024    2:46 PM  CMP  Glucose 70 - 99 mg/dL 754  792  872   BUN 8 - 23 mg/dL 22  19  21    Creatinine 0.61 - 1.24 mg/dL 8.84  9.02  8.75   Sodium 135 - 145 mmol/L 136  137  139   Potassium 3.5 - 5.1 mmol/L 4.1  4.3  4.4   Chloride 98 - 111 mmol/L 105  103  103   CO2 22 - 32 mmol/L 24  28  30    Calcium  8.9 - 10.3 mg/dL 8.7  8.6  8.9   Total Protein 6.5 - 8.1 g/dL 6.2  5.9  6.3   Total Bilirubin 0.0 - 1.2 mg/dL 1.0  1.2  0.9   Alkaline Phos 38 - 126 U/L 63  51  50   AST 15 - 41 U/L 23  16  19    ALT 0 - 44 U/L 59  34  42     Lab Results   Component Value Date   LDH 110 01/04/2022    02/23/2021 BCR ABL    02/23/2021 JAK2   Surgical Pathology  CASE: [id number]  PATIENT: Tiler Kirkwood  Bone Marrow Report  Clinical History: MPN with progressive anemia  (BH)  DIAGNOSIS:   BONE MARROW, ASPIRATE, CLOT, CORE:  -Hypercellular bone marrow with features of myeloid neoplasm  -See comment   PERIPHERAL BLOOD:  -Macrocytic anemia  -Leukocytosis  -Thrombocytosis   COMMENT:   The bone marrow/peripheral blood show persistent involvement by  previously known myeloid neoplasm.  There is a myeloproliferative  component as supported by previous JAK2 positivity.  However, there are  also dyspoietic changes primarily involving the megakaryocytic cell line  with numerous hypolobated/unilobated forms in addition to  dysgranulopoiesis to a lesser extent.  This is associated with  eosinophilia.  It is not entirely clear whether the overall findings  represent a myeloproliferative neoplasm with treatment related changes  or represent a primary myeloproliferative/myelodysplastic neoplasm  including but not limited to myeloid neoplasms with eosinophilia.  Correlation with cytogenetic and FISH studies strongly recommended.      RADIOGRAPHIC STUDIES: I have personally reviewed the radiological images as listed and agreed with the findings in the report. No results found.    IR IMAGING GUIDED PORT INSERTION  Result Date: 09/14/2022 INDICATION: Poor IV access.  Myelodysplastic syndrome EXAM: IMPLANTED PORT A CATH PLACEMENT WITH ULTRASOUND AND FLUOROSCOPIC GUIDANCE MEDICATIONS: None ANESTHESIA/SEDATION: Moderate (conscious) sedation was employed during this procedure. A total of Versed  3 mg and Fentanyl  100 mcg was administered intravenously. Moderate Sedation Time: 20 minutes. The patient's level of consciousness and vital signs were monitored continuously by radiology nursing throughout the procedure under my direct supervision.  FLUOROSCOPY TIME:  Fluoroscopic dose; 1 mGy COMPLICATIONS: None immediate. PROCEDURE: The procedure, risks, benefits, and alternatives were explained to the patient. Questions regarding the procedure were encouraged and answered. The patient understands and consents to the procedure. The RIGHT neck and chest were prepped with chlorhexidine  in a sterile fashion, and a sterile drape was applied covering the operative field. Maximum barrier sterile technique with sterile gowns and gloves were used for the procedure. A timeout was performed prior to the initiation of the procedure. Local anesthesia was provided with 1% lidocaine  with epinephrine . After creating a small venotomy incision, a micropuncture kit was utilized to access the internal jugular vein under direct, real-time ultrasound guidance. Ultrasound image documentation was performed. The microwire was kinked to measure appropriate catheter length. A subcutaneous port pocket was then created along the upper chest wall utilizing a combination of sharp and blunt dissection. The pocket was irrigated with sterile saline. A single lumen ISP power injectable port was chosen for placement. The 8 Fr catheter was tunneled from the port pocket site to the venotomy incision. The port was placed in the pocket. The external catheter was trimmed to appropriate length. At the venotomy, an 8 Fr peel-away sheath was placed over a guidewire under fluoroscopic guidance. The catheter was then placed through the sheath and the sheath was removed. Final catheter positioning was confirmed and documented with a fluoroscopic spot radiograph. The port was accessed with a Huber needle, aspirated and flushed with heparinized saline. The port pocket incision was closed with interrupted 3-0 Vicryl suture then Dermabond was applied, including at the venotomy incision. Dressings were placed. The patient tolerated the procedure well without immediate post procedural complication. IMPRESSION:  Successful placement of a RIGHT internal jugular approach power injectable Port-A-Cath. The tip of the catheter is positioned within the proximal RIGHT atrium. The catheter is ready for immediate use. Thom Hall, MD [facility] Radiology Electronically Signed   By: Thom Hall M.D.   On: 09/14/2022 17:18   CT ANGIO GI BLEED  Result Date: 08/31/2022 CLINICAL DATA:  Acute mesenteric ischemia. Abdominal pain. History of myelodysplastic syndrome. EXAM: CTA ABDOMEN AND PELVIS WITHOUT AND WITH CONTRAST TECHNIQUE: Multidetector CT imaging of the abdomen and pelvis was performed using the standard protocol during bolus administration of intravenous contrast. Multiplanar reconstructed images and MIPs were obtained and reviewed to evaluate the vascular anatomy. RADIATION DOSE REDUCTION: This exam was performed according to the departmental dose-optimization program which includes automated exposure control, adjustment of the mA and/or kV according to patient size and/or use of iterative reconstruction technique. CONTRAST:  OMNIPAQUE  IOHEXOL  350 MG/ML SOLN COMPARISON:  Abdominal ultrasound examination 04/03/2021 FINDINGS: VASCULAR Aorta: Normal caliber abdominal aorta. Scattered atherosclerotic calcifications. No dissection. Celiac: Normal SMA: Normal Renals: Normal IMA: Patent Inflow: Normal Proximal Outflow: Normal Veins: Normal Review of the MIP images confirms the above findings. NON-VASCULAR Lower chest: Small left pleural effusion and bibasilar atelectasis. The heart is normal in size. No pericardial effusion. There is a moderate to large hiatal hernia. Hepatobiliary: No hepatic lesions or intrahepatic biliary dilatation. The gallbladder is unremarkable. No common bile  duct dilatation. Pancreas: No mass, inflammation or ductal dilatation. Spleen: Massive splenomegaly. The spleen measures 20 x 16 x 11 cm. No splenic lesions or splenic infarct. Adrenals/Urinary  Tract: The left kidney is displaced medially and inferiorly by the enlarged spleen. No worrisome renal lesions,, hydronephrosis or pyelonephritis. There is a lower pole right renal calculus and there are 2 distal left ureteral calculi more proximal calculus measures 4 mm and the more distal calculus measures 5 mm. I do not see any hydroureter or hydronephrosis. Stomach/Bowel: The stomach, duodenum, small bowel and colon are grossly normal. No inflammatory changes, mass lesions or obstructive findings. Lymphatic: No abdominal or pelvic lymphadenopathy. Reproductive: The prostate gland is enlarged. The seminal vesicles are unremarkable. Other: No pelvic mass or adenopathy. No free pelvic fluid collections. No inguinal mass or adenopathy. No abdominal wall hernia or subcutaneous lesions. Musculoskeletal: No significant bony findings. IMPRESSION: 1. Normal caliber abdominal aorta and no dissection. The branch vessels are normal. 2. Splenomegaly. 3. Two distal left ureteral calculi but no hydroureter or hydronephrosis. 4. Lower pole right renal calculus. 5. Moderate to large hiatal hernia. 6. Small left pleural effusion and bibasilar atelectasis. Electronically Signed   By: MYRTIS Stammer M.D.   On: 08/31/2022 18:13     ASSESSMENT & PLAN:   78 y.o. male with:  #1 JAK2-positive myeloproliferative neoplasm-primarily presenting with thrombocytosis and associated MDS causing refractory anemia -No polycythemia.  -Mild leukocytosis.  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 Has grade 1 out of 3 reticulin fibrosis.  Uncertain if this is primary or secondary. -LDH has remained within normal limits. 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 -CT Angio GI bleed scan from 09/01/2022 showed kidney stones.and significant splenomegaly  PLAN:  -Discussed lab results on 05/18/2024 in detail with patient. CBC showed WBC of 5.8K, hemoglobin of 7.8, and platelets of 145K. -WBCs  stable -no neutropenia -PLT normal -discussed that his fatigue is not likely to be only from his anemia, but more likely from multiple parameters, including iron -overload -discussed that the proliferation elements driven by JAK2 mutation is effectively controlled with JAK2 inhibitor -Patient has tolerated Vonjo  200 mg po BID with no major toxicity issues  -continue Vonjo  200 mg po BID -will increase Jadenu  from 1 tablet (180 MG) to 2 tablets (360 MG total) p.o. daily  -we are continuing to minimize iron  going into his body -continue to avoid alcohol -discussed that we generally are limiting transfusion support unless absolutely necessary -discussed that over-transfusing can decrease the drive of the bone marrow -we will hold off on blood transfusion at this time - patient is agreeable -discussed that if he has any lightheadedness, dizziness, or significant SOB, this would be a more clear reason to transfuse him -there is mild enlargement of the spleen at this time based on physical exam, which is much smaller compared to previously -will order iron  labs with later labs, once he is stable for 2-3 months on new dose of iron  chelation of 2 tablets, to evaluate his response on that dose level.  -will continue to order CMP from time to time -discussed that if he can hold off on left eye cataract surgery at this time, and it is not significantly needed, this would be good from an iron  standpoint -continue to follow-up with Dr. Reyna. Discussed that if there are any considerations of clinical trials, we can get her input in this regard as well.  -continue to use compression socks to improve leg swelling -answered all of patient's  and his wife's questions in detail  Follow-up: Weekly labs and PRBC transfusion x 12 appointments  MD visit in 6 weeks  The total time spent in the appointment was 30 minutes* .  All of the patient's questions were answered with apparent satisfaction. The patient knows  to call the clinic with any problems, questions or concerns.   Emaline Saran MD MS AAHIVMS [facility] Hematology/Oncology Physician [facility]  .*Total Encounter Time as defined by the Centers for Medicare and Medicaid Services includes, in addition to the face-to-face time of a patient visit (documented in the note above) non-face-to-face time: obtaining and reviewing outside history, ordering and reviewing medications, tests or procedures, care coordination (communications with other health care professionals or caregivers) and documentation in the medical record.    I,Mitra Faeizi,acting as a Neurosurgeon for Emaline Saran, MD.,have documented all relevant documentation on the behalf of Emaline Saran, MD,as directed by  Emaline Saran, MD while in the presence of Emaline Saran, MD.  .I have reviewed the above documentation for accuracy and completeness, and I agree with the above. .Berlyn Malina Kishore Dayten Juba MD

## 2024-05-18 ENCOUNTER — Inpatient Hospital Stay (HOSPITAL_BASED_OUTPATIENT_CLINIC_OR_DEPARTMENT_OTHER): Admitting: Hematology

## 2024-05-18 ENCOUNTER — Inpatient Hospital Stay

## 2024-05-18 ENCOUNTER — Other Ambulatory Visit: Payer: Self-pay

## 2024-05-18 ENCOUNTER — Inpatient Hospital Stay: Attending: Hematology

## 2024-05-18 VITALS — BP 105/66 | HR 59 | Temp 97.7°F | Resp 20 | Wt 159.8 lb

## 2024-05-18 DIAGNOSIS — N202 Calculus of kidney with calculus of ureter: Secondary | ICD-10-CM | POA: Insufficient documentation

## 2024-05-18 DIAGNOSIS — D464 Refractory anemia, unspecified: Secondary | ICD-10-CM | POA: Diagnosis not present

## 2024-05-18 DIAGNOSIS — D539 Nutritional anemia, unspecified: Secondary | ICD-10-CM | POA: Diagnosis not present

## 2024-05-18 DIAGNOSIS — Z87442 Personal history of urinary calculi: Secondary | ICD-10-CM | POA: Diagnosis not present

## 2024-05-18 DIAGNOSIS — D75839 Thrombocytosis, unspecified: Secondary | ICD-10-CM | POA: Insufficient documentation

## 2024-05-18 DIAGNOSIS — R161 Splenomegaly, not elsewhere classified: Secondary | ICD-10-CM | POA: Insufficient documentation

## 2024-05-18 DIAGNOSIS — Z79899 Other long term (current) drug therapy: Secondary | ICD-10-CM | POA: Insufficient documentation

## 2024-05-18 DIAGNOSIS — R5383 Other fatigue: Secondary | ICD-10-CM | POA: Diagnosis not present

## 2024-05-18 DIAGNOSIS — M542 Cervicalgia: Secondary | ICD-10-CM | POA: Diagnosis not present

## 2024-05-18 DIAGNOSIS — D469 Myelodysplastic syndrome, unspecified: Secondary | ICD-10-CM | POA: Diagnosis not present

## 2024-05-18 DIAGNOSIS — J90 Pleural effusion, not elsewhere classified: Secondary | ICD-10-CM | POA: Insufficient documentation

## 2024-05-18 DIAGNOSIS — M1711 Unilateral primary osteoarthritis, right knee: Secondary | ICD-10-CM | POA: Diagnosis not present

## 2024-05-18 DIAGNOSIS — Z87891 Personal history of nicotine dependence: Secondary | ICD-10-CM | POA: Insufficient documentation

## 2024-05-18 DIAGNOSIS — E782 Mixed hyperlipidemia: Secondary | ICD-10-CM | POA: Diagnosis not present

## 2024-05-18 DIAGNOSIS — D649 Anemia, unspecified: Secondary | ICD-10-CM

## 2024-05-18 DIAGNOSIS — D72829 Elevated white blood cell count, unspecified: Secondary | ICD-10-CM | POA: Diagnosis not present

## 2024-05-18 DIAGNOSIS — K449 Diaphragmatic hernia without obstruction or gangrene: Secondary | ICD-10-CM | POA: Insufficient documentation

## 2024-05-18 DIAGNOSIS — Z79624 Long term (current) use of inhibitors of nucleotide synthesis: Secondary | ICD-10-CM | POA: Insufficient documentation

## 2024-05-18 DIAGNOSIS — Z8719 Personal history of other diseases of the digestive system: Secondary | ICD-10-CM | POA: Insufficient documentation

## 2024-05-18 DIAGNOSIS — E785 Hyperlipidemia, unspecified: Secondary | ICD-10-CM | POA: Diagnosis not present

## 2024-05-18 DIAGNOSIS — Z860101 Personal history of adenomatous and serrated colon polyps: Secondary | ICD-10-CM | POA: Diagnosis not present

## 2024-05-18 DIAGNOSIS — K219 Gastro-esophageal reflux disease without esophagitis: Secondary | ICD-10-CM | POA: Diagnosis not present

## 2024-05-18 LAB — CBC WITH DIFFERENTIAL (CANCER CENTER ONLY)
Abs Immature Granulocytes: 0.19 K/uL — ABNORMAL HIGH (ref 0.00–0.07)
Basophils Absolute: 0.2 K/uL — ABNORMAL HIGH (ref 0.0–0.1)
Basophils Relative: 3 %
Eosinophils Absolute: 0.3 K/uL (ref 0.0–0.5)
Eosinophils Relative: 6 %
HCT: 23.4 % — ABNORMAL LOW (ref 39.0–52.0)
Hemoglobin: 7.8 g/dL — ABNORMAL LOW (ref 13.0–17.0)
Immature Granulocytes: 3 %
Lymphocytes Relative: 10 %
Lymphs Abs: 0.6 K/uL — ABNORMAL LOW (ref 0.7–4.0)
MCH: 32.5 pg (ref 26.0–34.0)
MCHC: 33.3 g/dL (ref 30.0–36.0)
MCV: 97.5 fL (ref 80.0–100.0)
Monocytes Absolute: 0.6 K/uL (ref 0.1–1.0)
Monocytes Relative: 11 %
Neutro Abs: 3.9 K/uL (ref 1.7–7.7)
Neutrophils Relative %: 67 %
Platelet Count: 145 K/uL — ABNORMAL LOW (ref 150–400)
RBC: 2.4 MIL/uL — ABNORMAL LOW (ref 4.22–5.81)
RDW: 18.4 % — ABNORMAL HIGH (ref 11.5–15.5)
WBC Count: 5.8 K/uL (ref 4.0–10.5)
nRBC: 0 % (ref 0.0–0.2)

## 2024-05-18 LAB — SAMPLE TO BLOOD BANK

## 2024-05-18 MED ORDER — DEFERASIROX 180 MG PO TABS
360.0000 mg | ORAL_TABLET | Freq: Every day | ORAL | 2 refills | Status: DC
  Filled 2024-05-18: qty 60, 30d supply, fill #0
  Filled 2024-06-20 – 2024-07-04 (×2): qty 60, 30d supply, fill #1
  Filled 2024-07-30: qty 60, 30d supply, fill #2

## 2024-05-18 MED ORDER — SODIUM CHLORIDE 0.9% FLUSH
10.0000 mL | Freq: Once | INTRAVENOUS | Status: AC
Start: 2024-05-18 — End: 2024-05-18
  Administered 2024-05-18: 10 mL

## 2024-05-18 MED ORDER — DEFERASIROX 180 MG PO TABS
360.0000 mg | ORAL_TABLET | Freq: Every day | ORAL | 2 refills | Status: DC
Start: 2024-05-18 — End: 2024-05-18

## 2024-05-21 ENCOUNTER — Encounter (INDEPENDENT_AMBULATORY_CARE_PROVIDER_SITE_OTHER): Payer: Self-pay

## 2024-05-22 ENCOUNTER — Other Ambulatory Visit: Payer: Self-pay | Admitting: Pharmacy Technician

## 2024-05-22 ENCOUNTER — Other Ambulatory Visit: Payer: Self-pay

## 2024-05-22 DIAGNOSIS — K219 Gastro-esophageal reflux disease without esophagitis: Secondary | ICD-10-CM | POA: Diagnosis not present

## 2024-05-22 DIAGNOSIS — H2512 Age-related nuclear cataract, left eye: Secondary | ICD-10-CM | POA: Diagnosis not present

## 2024-05-22 DIAGNOSIS — D649 Anemia, unspecified: Secondary | ICD-10-CM | POA: Diagnosis not present

## 2024-05-22 DIAGNOSIS — H268 Other specified cataract: Secondary | ICD-10-CM | POA: Diagnosis not present

## 2024-05-22 NOTE — Progress Notes (Signed)
 Specialty Pharmacy Refill Coordination Note  ARSHAN JABS is a 78 y.o. male contacted today regarding refills of specialty medication(s)  Deferasirox     Patient requested (Patient-Rptd) Pickup at [facility] date: (Patient-Rptd) 05/24/24   Medication will be filled on 05/23/24.

## 2024-05-23 ENCOUNTER — Other Ambulatory Visit: Payer: Self-pay

## 2024-05-23 DIAGNOSIS — D469 Myelodysplastic syndrome, unspecified: Secondary | ICD-10-CM

## 2024-05-24 ENCOUNTER — Inpatient Hospital Stay

## 2024-05-24 ENCOUNTER — Other Ambulatory Visit: Payer: Self-pay

## 2024-05-24 ENCOUNTER — Encounter: Payer: Self-pay | Admitting: Hematology

## 2024-05-24 ENCOUNTER — Other Ambulatory Visit: Payer: Self-pay | Admitting: Hematology

## 2024-05-24 DIAGNOSIS — K219 Gastro-esophageal reflux disease without esophagitis: Secondary | ICD-10-CM | POA: Diagnosis not present

## 2024-05-24 DIAGNOSIS — D649 Anemia, unspecified: Secondary | ICD-10-CM

## 2024-05-24 DIAGNOSIS — D464 Refractory anemia, unspecified: Secondary | ICD-10-CM | POA: Diagnosis not present

## 2024-05-24 DIAGNOSIS — M1711 Unilateral primary osteoarthritis, right knee: Secondary | ICD-10-CM | POA: Diagnosis not present

## 2024-05-24 DIAGNOSIS — J90 Pleural effusion, not elsewhere classified: Secondary | ICD-10-CM | POA: Diagnosis not present

## 2024-05-24 DIAGNOSIS — R5383 Other fatigue: Secondary | ICD-10-CM | POA: Diagnosis not present

## 2024-05-24 DIAGNOSIS — D469 Myelodysplastic syndrome, unspecified: Secondary | ICD-10-CM

## 2024-05-24 DIAGNOSIS — Z79624 Long term (current) use of inhibitors of nucleotide synthesis: Secondary | ICD-10-CM | POA: Diagnosis not present

## 2024-05-24 DIAGNOSIS — D72829 Elevated white blood cell count, unspecified: Secondary | ICD-10-CM | POA: Diagnosis not present

## 2024-05-24 DIAGNOSIS — K449 Diaphragmatic hernia without obstruction or gangrene: Secondary | ICD-10-CM | POA: Diagnosis not present

## 2024-05-24 DIAGNOSIS — Z79899 Other long term (current) drug therapy: Secondary | ICD-10-CM | POA: Diagnosis not present

## 2024-05-24 DIAGNOSIS — D75839 Thrombocytosis, unspecified: Secondary | ICD-10-CM | POA: Diagnosis not present

## 2024-05-24 DIAGNOSIS — E782 Mixed hyperlipidemia: Secondary | ICD-10-CM | POA: Diagnosis not present

## 2024-05-24 DIAGNOSIS — N202 Calculus of kidney with calculus of ureter: Secondary | ICD-10-CM | POA: Diagnosis not present

## 2024-05-24 DIAGNOSIS — E785 Hyperlipidemia, unspecified: Secondary | ICD-10-CM | POA: Diagnosis not present

## 2024-05-24 DIAGNOSIS — M542 Cervicalgia: Secondary | ICD-10-CM | POA: Diagnosis not present

## 2024-05-24 DIAGNOSIS — D539 Nutritional anemia, unspecified: Secondary | ICD-10-CM | POA: Diagnosis not present

## 2024-05-24 DIAGNOSIS — R161 Splenomegaly, not elsewhere classified: Secondary | ICD-10-CM | POA: Diagnosis not present

## 2024-05-24 DIAGNOSIS — Z87442 Personal history of urinary calculi: Secondary | ICD-10-CM | POA: Diagnosis not present

## 2024-05-24 DIAGNOSIS — Z87891 Personal history of nicotine dependence: Secondary | ICD-10-CM | POA: Diagnosis not present

## 2024-05-24 DIAGNOSIS — Z8719 Personal history of other diseases of the digestive system: Secondary | ICD-10-CM | POA: Diagnosis not present

## 2024-05-24 DIAGNOSIS — Z860101 Personal history of adenomatous and serrated colon polyps: Secondary | ICD-10-CM | POA: Diagnosis not present

## 2024-05-24 LAB — CMP (CANCER CENTER ONLY)
ALT: 59 U/L — ABNORMAL HIGH (ref 0–44)
AST: 23 U/L (ref 15–41)
Albumin: 4.3 g/dL (ref 3.5–5.0)
Alkaline Phosphatase: 63 U/L (ref 38–126)
Anion gap: 7 (ref 5–15)
BUN: 22 mg/dL (ref 8–23)
CO2: 24 mmol/L (ref 22–32)
Calcium: 8.7 mg/dL — ABNORMAL LOW (ref 8.9–10.3)
Chloride: 105 mmol/L (ref 98–111)
Creatinine: 1.15 mg/dL (ref 0.61–1.24)
GFR, Estimated: >60 mL/min (ref >60)
Glucose, Bld: 245 mg/dL — ABNORMAL HIGH (ref 70–99)
Potassium: 4.1 mmol/L (ref 3.5–5.1)
Sodium: 136 mmol/L (ref 135–145)
Total Bilirubin: 1 mg/dL (ref 0.0–1.2)
Total Protein: 6.2 g/dL — ABNORMAL LOW (ref 6.5–8.1)

## 2024-05-24 LAB — CBC WITH DIFFERENTIAL (CANCER CENTER ONLY)
Abs Immature Granulocytes: 0.13 K/uL — ABNORMAL HIGH (ref 0.00–0.07)
Basophils Absolute: 0.2 K/uL — ABNORMAL HIGH (ref 0.0–0.1)
Basophils Relative: 3 %
Eosinophils Absolute: 0.2 K/uL (ref 0.0–0.5)
Eosinophils Relative: 4 %
HCT: 22.3 % — ABNORMAL LOW (ref 39.0–52.0)
Hemoglobin: 7.4 g/dL — ABNORMAL LOW (ref 13.0–17.0)
Immature Granulocytes: 3 %
Lymphocytes Relative: 9 %
Lymphs Abs: 0.5 K/uL — ABNORMAL LOW (ref 0.7–4.0)
MCH: 32.7 pg (ref 26.0–34.0)
MCHC: 33.2 g/dL (ref 30.0–36.0)
MCV: 98.7 fL (ref 80.0–100.0)
Monocytes Absolute: 0.4 K/uL (ref 0.1–1.0)
Monocytes Relative: 8 %
Neutro Abs: 3.6 K/uL (ref 1.7–7.7)
Neutrophils Relative %: 73 %
Platelet Count: 160 K/uL (ref 150–400)
RBC: 2.26 MIL/uL — ABNORMAL LOW (ref 4.22–5.81)
RDW: 19.9 % — ABNORMAL HIGH (ref 11.5–15.5)
WBC Count: 5 K/uL (ref 4.0–10.5)
nRBC: 0 % (ref 0.0–0.2)

## 2024-05-24 LAB — SAMPLE TO BLOOD BANK

## 2024-05-24 LAB — PREPARE RBC (CROSSMATCH)

## 2024-05-24 MED ORDER — METHYLPREDNISOLONE SODIUM SUCC 40 MG IJ SOLR
40.0000 mg | Freq: Once | INTRAMUSCULAR | Status: AC
  Administered 2024-05-24: 40 mg via INTRAVENOUS
  Filled 2024-05-24: qty 1

## 2024-05-24 MED ORDER — SODIUM CHLORIDE 0.9% FLUSH
10.0000 mL | INTRAVENOUS | Status: AC | PRN
  Administered 2024-05-24: 10 mL

## 2024-05-24 MED ORDER — SODIUM CHLORIDE 0.9% FLUSH
10.0000 mL | Freq: Once | INTRAVENOUS | Status: AC
  Administered 2024-05-24: 10 mL

## 2024-05-24 MED ORDER — ACETAMINOPHEN 325 MG PO TABS
650.0000 mg | ORAL_TABLET | Freq: Once | ORAL | Status: AC
  Administered 2024-05-24: 650 mg via ORAL
  Filled 2024-05-24: qty 2

## 2024-05-24 MED ORDER — SODIUM CHLORIDE 0.9% IV SOLUTION
250.0000 mL | INTRAVENOUS | Status: DC
  Administered 2024-05-24: 100 mL via INTRAVENOUS

## 2024-05-24 NOTE — Patient Instructions (Signed)
 Blood Transfusion, Adult A blood transfusion is a procedure in which you receive blood or a type of blood cell (blood component) through an IV. You may need a blood transfusion when you have a low blood count, which is a low number of any blood cell. This may result from a bleeding disorder, illness, injury, or surgery. The blood may come from a donor, or you may be able to have your own blood collected and stored (autologous blood donation) before a planned surgery. The blood given in a transfusion may be made up of different blood components. You may receive: Red blood cells. These carry oxygen to the cells in the body. Platelets. These help your blood to clot. Plasma. This is the liquid part of your blood. It carries proteins and other substances throughout the body. White blood cells. These help you fight infections. If you have hemophilia or another clotting disorder, you may also receive other types of blood products. Depending on the type of blood product, this procedure may take 1-4 hours to complete. Tell a health care provider about: Any bleeding problems you have. Any previous reactions you have had during a blood transfusion. Any allergies you have. All medicines you are taking, including vitamins, herbs, eye drops, creams, and over-the-counter medicines. Any surgeries you have had. Any medical conditions you have. Whether you are pregnant or may be pregnant. What are the risks? Talk with your health care provider about risks. The most common problems include: A mild allergic reaction, such as red, swollen areas of skin (hives) and itching. Fever or chills. This may be the body's response to new blood cells received. This may occur during or up to 4 hours after the transfusion. More serious problems may include: A serious allergic reaction that causes difficulty breathing or swelling around the face and lips. Transfusion-associated circulatory overload (TACO), or too much fluid in  the lungs. This may cause breathing problems. Transfusion-related acute lung injury (TRALI), which causes breathing difficulty and low oxygen in the blood. This can occur within hours of the transfusion or several days later. Iron overload. This can happen after receiving many blood transfusions over a period of time. Infection or virus being transmitted. This is rare because donated blood is carefully tested before it is given. Hemolytic transfusion reaction. This is rare. It happens when the body's defense system (immune system)tries to attack the new blood cells. Symptoms may include fever, chills, nausea, low blood pressure, and low back or chest pain. Transfusion-associated graft-versus-host disease (TAGVHD). This is rare. It happens when donated cells attack the body's healthy tissues. What happens before the procedure? You will have a blood test to check your blood type. This test is done to know what kind of blood your body will accept and to match it to the donor blood. If you are going to have a planned surgery, you may be able to do an autologous blood donation. This may be done in case you need to have a transfusion. You will have your temperature, blood pressure, and pulse checked before the transfusion. If you have had an allergic reaction to a transfusion in the past, you may be given medicine to help prevent a reaction. This medicine may be given to you by mouth (orally) or through an IV. What happens during the procedure?  An IV will be inserted into one of your veins. The bag of blood will be attached to your IV. The blood will then enter through your vein. Your temperature, blood pressure,  and pulse will be monitored during the transfusion. This monitoring is done to detect early signs of a transfusion reaction. Tell your nurse right away if you have any of these symptoms during the transfusion: Shortness of breath or trouble breathing. Chest or back pain. Fever or  chills. Itching or hives. If you have any signs or symptoms of a reaction, your transfusion will be stopped and you may be given medicine. When the transfusion is complete, your IV will be removed. Pressure may be applied to the IV site for a few minutes. A bandage (dressing)will be applied. The procedure may vary among health care providers and hospitals. What happens after the procedure? Your temperature, blood pressure, pulse, breathing rate, and blood oxygen level will be monitored until you leave the hospital or clinic. Your blood may be tested to see how you have responded to the transfusion. You may be warmed with fluids or blankets to maintain a normal body temperature. If you receive your blood transfusion in an outpatient setting, you will be told whom to contact to report any reactions. Where to find more information Visit the [employer]: redcross.org Summary A blood transfusion is a procedure in which you receive blood or a type of blood cell (blood component) through an IV. The blood given in a transfusion may be made up of different blood components. You may receive red blood cells, platelets, plasma, or white blood cells depending on the condition treated. Your temperature, blood pressure, and pulse will be monitored before, during, and after the transfusion. After the transfusion, your blood may be tested to see how your body has responded. This information is not intended to replace advice given to you by your health care provider. Make sure you discuss any questions you have with your health care provider. Document Revised: 01/01/2022 Document Reviewed: 01/01/2022 Elsevier Patient Education  2024 [employer].

## 2024-05-25 LAB — BPAM RBC
Blood Product Expiration Date: 202509062359
ISSUE DATE / TIME: 202508070923
Unit Type and Rh: 6200

## 2024-05-25 LAB — TYPE AND SCREEN
ABO/RH(D): A POS
Antibody Screen: NEGATIVE
Unit division: 0

## 2024-05-28 ENCOUNTER — Telehealth: Payer: Self-pay | Admitting: Hematology

## 2024-05-28 NOTE — Telephone Encounter (Signed)
 [name] confirmed Jame's appointment for 8/22. [name] asked if we had any availability on Thursday 8/21. I informed [name] the reason why he has been scheduled on 8/22 is due to our availability. [name] was acceptable.

## 2024-05-28 NOTE — Telephone Encounter (Signed)
 I informed Fernando Lee of his PRBC's scheduled for this week and next week. Fernando Lee stated that he will get back with us  about his appointments scheduled for 8/2 due to another appointment that may conflict.

## 2024-05-30 ENCOUNTER — Other Ambulatory Visit: Payer: Self-pay

## 2024-05-30 DIAGNOSIS — D469 Myelodysplastic syndrome, unspecified: Secondary | ICD-10-CM

## 2024-05-31 ENCOUNTER — Inpatient Hospital Stay

## 2024-05-31 ENCOUNTER — Other Ambulatory Visit

## 2024-05-31 DIAGNOSIS — M542 Cervicalgia: Secondary | ICD-10-CM | POA: Diagnosis not present

## 2024-05-31 DIAGNOSIS — D464 Refractory anemia, unspecified: Secondary | ICD-10-CM | POA: Diagnosis not present

## 2024-05-31 DIAGNOSIS — Z87891 Personal history of nicotine dependence: Secondary | ICD-10-CM | POA: Diagnosis not present

## 2024-05-31 DIAGNOSIS — Z8719 Personal history of other diseases of the digestive system: Secondary | ICD-10-CM | POA: Diagnosis not present

## 2024-05-31 DIAGNOSIS — E782 Mixed hyperlipidemia: Secondary | ICD-10-CM | POA: Diagnosis not present

## 2024-05-31 DIAGNOSIS — Z860101 Personal history of adenomatous and serrated colon polyps: Secondary | ICD-10-CM | POA: Diagnosis not present

## 2024-05-31 DIAGNOSIS — R5383 Other fatigue: Secondary | ICD-10-CM | POA: Diagnosis not present

## 2024-05-31 DIAGNOSIS — R161 Splenomegaly, not elsewhere classified: Secondary | ICD-10-CM | POA: Diagnosis not present

## 2024-05-31 DIAGNOSIS — D72829 Elevated white blood cell count, unspecified: Secondary | ICD-10-CM | POA: Diagnosis not present

## 2024-05-31 DIAGNOSIS — K219 Gastro-esophageal reflux disease without esophagitis: Secondary | ICD-10-CM | POA: Diagnosis not present

## 2024-05-31 DIAGNOSIS — Z79899 Other long term (current) drug therapy: Secondary | ICD-10-CM | POA: Diagnosis not present

## 2024-05-31 DIAGNOSIS — N202 Calculus of kidney with calculus of ureter: Secondary | ICD-10-CM | POA: Diagnosis not present

## 2024-05-31 DIAGNOSIS — D75839 Thrombocytosis, unspecified: Secondary | ICD-10-CM | POA: Diagnosis not present

## 2024-05-31 DIAGNOSIS — D469 Myelodysplastic syndrome, unspecified: Secondary | ICD-10-CM

## 2024-05-31 DIAGNOSIS — E785 Hyperlipidemia, unspecified: Secondary | ICD-10-CM | POA: Diagnosis not present

## 2024-05-31 DIAGNOSIS — Z79624 Long term (current) use of inhibitors of nucleotide synthesis: Secondary | ICD-10-CM | POA: Diagnosis not present

## 2024-05-31 DIAGNOSIS — J90 Pleural effusion, not elsewhere classified: Secondary | ICD-10-CM | POA: Diagnosis not present

## 2024-05-31 DIAGNOSIS — K449 Diaphragmatic hernia without obstruction or gangrene: Secondary | ICD-10-CM | POA: Diagnosis not present

## 2024-05-31 DIAGNOSIS — Z87442 Personal history of urinary calculi: Secondary | ICD-10-CM | POA: Diagnosis not present

## 2024-05-31 DIAGNOSIS — D539 Nutritional anemia, unspecified: Secondary | ICD-10-CM | POA: Diagnosis not present

## 2024-05-31 DIAGNOSIS — M1711 Unilateral primary osteoarthritis, right knee: Secondary | ICD-10-CM | POA: Diagnosis not present

## 2024-05-31 LAB — CBC WITH DIFFERENTIAL (CANCER CENTER ONLY)
Abs Immature Granulocytes: 0.12 K/uL — ABNORMAL HIGH (ref 0.00–0.07)
Basophils Absolute: 0.2 K/uL — ABNORMAL HIGH (ref 0.0–0.1)
Basophils Relative: 3 %
Eosinophils Absolute: 0.3 K/uL (ref 0.0–0.5)
Eosinophils Relative: 6 %
HCT: 24.2 % — ABNORMAL LOW (ref 39.0–52.0)
Hemoglobin: 8.1 g/dL — ABNORMAL LOW (ref 13.0–17.0)
Immature Granulocytes: 3 %
Lymphocytes Relative: 8 %
Lymphs Abs: 0.4 K/uL — ABNORMAL LOW (ref 0.7–4.0)
MCH: 32.4 pg (ref 26.0–34.0)
MCHC: 33.5 g/dL (ref 30.0–36.0)
MCV: 96.8 fL (ref 80.0–100.0)
Monocytes Absolute: 0.5 K/uL (ref 0.1–1.0)
Monocytes Relative: 10 %
Neutro Abs: 3.5 K/uL (ref 1.7–7.7)
Neutrophils Relative %: 70 %
Platelet Count: 152 K/uL (ref 150–400)
RBC: 2.5 MIL/uL — ABNORMAL LOW (ref 4.22–5.81)
RDW: 19.9 % — ABNORMAL HIGH (ref 11.5–15.5)
WBC Count: 4.9 K/uL (ref 4.0–10.5)
nRBC: 0 % (ref 0.0–0.2)

## 2024-05-31 LAB — SAMPLE TO BLOOD BANK

## 2024-05-31 LAB — TYPE AND SCREEN
ABO/RH(D): A POS
Antibody Screen: NEGATIVE

## 2024-05-31 MED ORDER — SODIUM CHLORIDE 0.9% FLUSH
10.0000 mL | Freq: Once | INTRAVENOUS | Status: AC
Start: 2024-05-31 — End: 2024-05-31
  Administered 2024-05-31: 10 mL

## 2024-05-31 NOTE — Progress Notes (Signed)
 Pt reported to [facility] today for blood transfusion, Pt's HGB 8.1. Per parameters transfuse as needed for hgb less than 7.5, NO blood transfusion needed. This RN made Pt aware. Pt verbalized understanding and was agreeable with plan. Pt ambulatory to lobby at time of discharge.

## 2024-06-06 ENCOUNTER — Other Ambulatory Visit: Payer: Self-pay

## 2024-06-06 ENCOUNTER — Other Ambulatory Visit: Payer: Self-pay | Admitting: Pharmacy Technician

## 2024-06-06 DIAGNOSIS — R350 Frequency of micturition: Secondary | ICD-10-CM | POA: Diagnosis not present

## 2024-06-06 DIAGNOSIS — R109 Unspecified abdominal pain: Secondary | ICD-10-CM | POA: Diagnosis not present

## 2024-06-06 NOTE — Progress Notes (Signed)
 Specialty Pharmacy Refill Coordination Note  Fernando Lee is a 78 y.o. male contacted today regarding refills of specialty medication(s) Pacritinib  Citrate (Vonjo )   Patient requested [name] at [facility] at [place] date: 06/08/24   Medication will be filled on 06/08/24.  Sandi to order med.

## 2024-06-07 ENCOUNTER — Other Ambulatory Visit: Payer: Self-pay

## 2024-06-07 DIAGNOSIS — D469 Myelodysplastic syndrome, unspecified: Secondary | ICD-10-CM

## 2024-06-08 ENCOUNTER — Telehealth: Payer: Self-pay | Admitting: Medical Oncology

## 2024-06-08 ENCOUNTER — Inpatient Hospital Stay

## 2024-06-08 DIAGNOSIS — Z87442 Personal history of urinary calculi: Secondary | ICD-10-CM | POA: Diagnosis not present

## 2024-06-08 DIAGNOSIS — E785 Hyperlipidemia, unspecified: Secondary | ICD-10-CM | POA: Diagnosis not present

## 2024-06-08 DIAGNOSIS — J90 Pleural effusion, not elsewhere classified: Secondary | ICD-10-CM | POA: Diagnosis not present

## 2024-06-08 DIAGNOSIS — E782 Mixed hyperlipidemia: Secondary | ICD-10-CM | POA: Diagnosis not present

## 2024-06-08 DIAGNOSIS — Z87891 Personal history of nicotine dependence: Secondary | ICD-10-CM | POA: Diagnosis not present

## 2024-06-08 DIAGNOSIS — K219 Gastro-esophageal reflux disease without esophagitis: Secondary | ICD-10-CM | POA: Diagnosis not present

## 2024-06-08 DIAGNOSIS — D649 Anemia, unspecified: Secondary | ICD-10-CM

## 2024-06-08 DIAGNOSIS — Z860101 Personal history of adenomatous and serrated colon polyps: Secondary | ICD-10-CM | POA: Diagnosis not present

## 2024-06-08 DIAGNOSIS — D469 Myelodysplastic syndrome, unspecified: Secondary | ICD-10-CM

## 2024-06-08 DIAGNOSIS — D539 Nutritional anemia, unspecified: Secondary | ICD-10-CM | POA: Diagnosis not present

## 2024-06-08 DIAGNOSIS — Z8719 Personal history of other diseases of the digestive system: Secondary | ICD-10-CM | POA: Diagnosis not present

## 2024-06-08 DIAGNOSIS — M1711 Unilateral primary osteoarthritis, right knee: Secondary | ICD-10-CM | POA: Diagnosis not present

## 2024-06-08 DIAGNOSIS — D72829 Elevated white blood cell count, unspecified: Secondary | ICD-10-CM | POA: Diagnosis not present

## 2024-06-08 DIAGNOSIS — K449 Diaphragmatic hernia without obstruction or gangrene: Secondary | ICD-10-CM | POA: Diagnosis not present

## 2024-06-08 DIAGNOSIS — D75839 Thrombocytosis, unspecified: Secondary | ICD-10-CM | POA: Diagnosis not present

## 2024-06-08 DIAGNOSIS — N202 Calculus of kidney with calculus of ureter: Secondary | ICD-10-CM | POA: Diagnosis not present

## 2024-06-08 DIAGNOSIS — D464 Refractory anemia, unspecified: Secondary | ICD-10-CM | POA: Diagnosis not present

## 2024-06-08 DIAGNOSIS — M542 Cervicalgia: Secondary | ICD-10-CM | POA: Diagnosis not present

## 2024-06-08 DIAGNOSIS — R161 Splenomegaly, not elsewhere classified: Secondary | ICD-10-CM | POA: Diagnosis not present

## 2024-06-08 DIAGNOSIS — R5383 Other fatigue: Secondary | ICD-10-CM | POA: Diagnosis not present

## 2024-06-08 DIAGNOSIS — Z79624 Long term (current) use of inhibitors of nucleotide synthesis: Secondary | ICD-10-CM | POA: Diagnosis not present

## 2024-06-08 DIAGNOSIS — Z79899 Other long term (current) drug therapy: Secondary | ICD-10-CM | POA: Diagnosis not present

## 2024-06-08 LAB — CBC WITH DIFFERENTIAL (CANCER CENTER ONLY)
Abs Immature Granulocytes: 0.16 K/uL — ABNORMAL HIGH (ref 0.00–0.07)
Basophils Absolute: 0.2 K/uL — ABNORMAL HIGH (ref 0.0–0.1)
Basophils Relative: 3 %
Eosinophils Absolute: 0.3 K/uL (ref 0.0–0.5)
Eosinophils Relative: 5 %
HCT: 20.6 % — ABNORMAL LOW (ref 39.0–52.0)
Hemoglobin: 6.9 g/dL — CL (ref 13.0–17.0)
Immature Granulocytes: 3 %
Lymphocytes Relative: 9 %
Lymphs Abs: 0.5 K/uL — ABNORMAL LOW (ref 0.7–4.0)
MCH: 32.9 pg (ref 26.0–34.0)
MCHC: 33.5 g/dL (ref 30.0–36.0)
MCV: 98.1 fL (ref 80.0–100.0)
Monocytes Absolute: 0.5 K/uL (ref 0.1–1.0)
Monocytes Relative: 10 %
Neutro Abs: 3.7 K/uL (ref 1.7–7.7)
Neutrophils Relative %: 70 %
Platelet Count: 151 K/uL (ref 150–400)
RBC: 2.1 MIL/uL — ABNORMAL LOW (ref 4.22–5.81)
RDW: 21.4 % — ABNORMAL HIGH (ref 11.5–15.5)
WBC Count: 5.2 K/uL (ref 4.0–10.5)
nRBC: 0 % (ref 0.0–0.2)

## 2024-06-08 LAB — SAMPLE TO BLOOD BANK

## 2024-06-08 LAB — PREPARE RBC (CROSSMATCH)

## 2024-06-08 MED ORDER — SODIUM CHLORIDE 0.9% IV SOLUTION
250.0000 mL | INTRAVENOUS | Status: DC
  Administered 2024-06-08: 100 mL via INTRAVENOUS

## 2024-06-08 MED ORDER — ACETAMINOPHEN 325 MG PO TABS
650.0000 mg | ORAL_TABLET | Freq: Once | ORAL | Status: AC
  Administered 2024-06-08: 650 mg via ORAL
  Filled 2024-06-08: qty 2

## 2024-06-08 MED ORDER — SODIUM CHLORIDE 0.9% FLUSH
10.0000 mL | Freq: Once | INTRAVENOUS | Status: AC
  Administered 2024-06-08: 10 mL

## 2024-06-08 MED ORDER — METHYLPREDNISOLONE SODIUM SUCC 40 MG IJ SOLR
40.0000 mg | Freq: Once | INTRAMUSCULAR | Status: AC
  Administered 2024-06-08: 40 mg via INTRAVENOUS
  Filled 2024-06-08: qty 1

## 2024-06-08 NOTE — Telephone Encounter (Signed)
 err

## 2024-06-08 NOTE — Patient Instructions (Signed)
 Blood Transfusion, Adult A blood transfusion is a procedure in which you receive blood or a type of blood cell (blood component) through an IV. You may need a blood transfusion when you have a low blood count, which is a low number of any blood cell. This may result from a bleeding disorder, illness, injury, or surgery. The blood may come from a donor, or you may be able to have your own blood collected and stored (autologous blood donation) before a planned surgery. The blood given in a transfusion may be made up of different blood components. You may receive: Red blood cells. These carry oxygen to the cells in the body. Platelets. These help your blood to clot. Plasma. This is the liquid part of your blood. It carries proteins and other substances throughout the body. White blood cells. These help you fight infections. If you have hemophilia or another clotting disorder, you may also receive other types of blood products. Depending on the type of blood product, this procedure may take 1-4 hours to complete. Tell a health care provider about: Any bleeding problems you have. Any previous reactions you have had during a blood transfusion. Any allergies you have. All medicines you are taking, including vitamins, herbs, eye drops, creams, and over-the-counter medicines. Any surgeries you have had. Any medical conditions you have. Whether you are pregnant or may be pregnant. What are the risks? Talk with your health care provider about risks. The most common problems include: A mild allergic reaction, such as red, swollen areas of skin (hives) and itching. Fever or chills. This may be the body's response to new blood cells received. This may occur during or up to 4 hours after the transfusion. More serious problems may include: A serious allergic reaction that causes difficulty breathing or swelling around the face and lips. Transfusion-associated circulatory overload (TACO), or too much fluid in  the lungs. This may cause breathing problems. Transfusion-related acute lung injury (TRALI), which causes breathing difficulty and low oxygen in the blood. This can occur within hours of the transfusion or several days later. Iron overload. This can happen after receiving many blood transfusions over a period of time. Infection or virus being transmitted. This is rare because donated blood is carefully tested before it is given. Hemolytic transfusion reaction. This is rare. It happens when the body's defense system (immune system)tries to attack the new blood cells. Symptoms may include fever, chills, nausea, low blood pressure, and low back or chest pain. Transfusion-associated graft-versus-host disease (TAGVHD). This is rare. It happens when donated cells attack the body's healthy tissues. What happens before the procedure? You will have a blood test to check your blood type. This test is done to know what kind of blood your body will accept and to match it to the donor blood. If you are going to have a planned surgery, you may be able to do an autologous blood donation. This may be done in case you need to have a transfusion. You will have your temperature, blood pressure, and pulse checked before the transfusion. If you have had an allergic reaction to a transfusion in the past, you may be given medicine to help prevent a reaction. This medicine may be given to you by mouth (orally) or through an IV. What happens during the procedure?  An IV will be inserted into one of your veins. The bag of blood will be attached to your IV. The blood will then enter through your vein. Your temperature, blood pressure,  and pulse will be monitored during the transfusion. This monitoring is done to detect early signs of a transfusion reaction. Tell your nurse right away if you have any of these symptoms during the transfusion: Shortness of breath or trouble breathing. Chest or back pain. Fever or  chills. Itching or hives. If you have any signs or symptoms of a reaction, your transfusion will be stopped and you may be given medicine. When the transfusion is complete, your IV will be removed. Pressure may be applied to the IV site for a few minutes. A bandage (dressing)will be applied. The procedure may vary among health care providers and hospitals. What happens after the procedure? Your temperature, blood pressure, pulse, breathing rate, and blood oxygen level will be monitored until you leave the hospital or clinic. Your blood may be tested to see how you have responded to the transfusion. You may be warmed with fluids or blankets to maintain a normal body temperature. If you receive your blood transfusion in an outpatient setting, you will be told whom to contact to report any reactions. Where to find more information Visit the [employer]: redcross.org Summary A blood transfusion is a procedure in which you receive blood or a type of blood cell (blood component) through an IV. The blood given in a transfusion may be made up of different blood components. You may receive red blood cells, platelets, plasma, or white blood cells depending on the condition treated. Your temperature, blood pressure, and pulse will be monitored before, during, and after the transfusion. After the transfusion, your blood may be tested to see how your body has responded. This information is not intended to replace advice given to you by your health care provider. Make sure you discuss any questions you have with your health care provider. Document Revised: 01/01/2022 Document Reviewed: 01/01/2022 Elsevier Patient Education  2024 [employer].

## 2024-06-08 NOTE — Telephone Encounter (Signed)
 CRITICAL VALUE STICKER  CRITICAL VALUE:             HGB 6.9  RECEIVER (on-site recipient of call):Volney Reierson,RN  DATE & TIME NOTIFIED: 06/08/2024 @ 0849  MESSENGER (representative from lab):Eleanor Servant  MD NOTIFIED: TIME OF NOTIFICATION:  Dr Onesimo Powell Silvan, RN  have already arranged for pt to receive one unit of blood today  .  RESPONSE:   Pt arrived to Infusion

## 2024-06-11 LAB — TYPE AND SCREEN
ABO/RH(D): A POS
Antibody Screen: NEGATIVE
Unit division: 0

## 2024-06-11 LAB — BPAM RBC
Blood Product Expiration Date: 202509182359
ISSUE DATE / TIME: 202508220949
Unit Type and Rh: 6200

## 2024-06-13 ENCOUNTER — Other Ambulatory Visit: Payer: Self-pay

## 2024-06-13 DIAGNOSIS — D469 Myelodysplastic syndrome, unspecified: Secondary | ICD-10-CM

## 2024-06-14 ENCOUNTER — Inpatient Hospital Stay

## 2024-06-14 DIAGNOSIS — Z87891 Personal history of nicotine dependence: Secondary | ICD-10-CM | POA: Diagnosis not present

## 2024-06-14 DIAGNOSIS — R161 Splenomegaly, not elsewhere classified: Secondary | ICD-10-CM | POA: Diagnosis not present

## 2024-06-14 DIAGNOSIS — D464 Refractory anemia, unspecified: Secondary | ICD-10-CM | POA: Diagnosis not present

## 2024-06-14 DIAGNOSIS — N202 Calculus of kidney with calculus of ureter: Secondary | ICD-10-CM | POA: Diagnosis not present

## 2024-06-14 DIAGNOSIS — E785 Hyperlipidemia, unspecified: Secondary | ICD-10-CM | POA: Diagnosis not present

## 2024-06-14 DIAGNOSIS — Z860101 Personal history of adenomatous and serrated colon polyps: Secondary | ICD-10-CM | POA: Diagnosis not present

## 2024-06-14 DIAGNOSIS — M1711 Unilateral primary osteoarthritis, right knee: Secondary | ICD-10-CM | POA: Diagnosis not present

## 2024-06-14 DIAGNOSIS — D75839 Thrombocytosis, unspecified: Secondary | ICD-10-CM | POA: Diagnosis not present

## 2024-06-14 DIAGNOSIS — Z79899 Other long term (current) drug therapy: Secondary | ICD-10-CM | POA: Diagnosis not present

## 2024-06-14 DIAGNOSIS — E782 Mixed hyperlipidemia: Secondary | ICD-10-CM | POA: Diagnosis not present

## 2024-06-14 DIAGNOSIS — D469 Myelodysplastic syndrome, unspecified: Secondary | ICD-10-CM

## 2024-06-14 DIAGNOSIS — D72829 Elevated white blood cell count, unspecified: Secondary | ICD-10-CM | POA: Diagnosis not present

## 2024-06-14 DIAGNOSIS — R5383 Other fatigue: Secondary | ICD-10-CM | POA: Diagnosis not present

## 2024-06-14 DIAGNOSIS — J90 Pleural effusion, not elsewhere classified: Secondary | ICD-10-CM | POA: Diagnosis not present

## 2024-06-14 DIAGNOSIS — D539 Nutritional anemia, unspecified: Secondary | ICD-10-CM | POA: Diagnosis not present

## 2024-06-14 DIAGNOSIS — Z79624 Long term (current) use of inhibitors of nucleotide synthesis: Secondary | ICD-10-CM | POA: Diagnosis not present

## 2024-06-14 DIAGNOSIS — Z8719 Personal history of other diseases of the digestive system: Secondary | ICD-10-CM | POA: Diagnosis not present

## 2024-06-14 DIAGNOSIS — Z87442 Personal history of urinary calculi: Secondary | ICD-10-CM | POA: Diagnosis not present

## 2024-06-14 DIAGNOSIS — K449 Diaphragmatic hernia without obstruction or gangrene: Secondary | ICD-10-CM | POA: Diagnosis not present

## 2024-06-14 DIAGNOSIS — M542 Cervicalgia: Secondary | ICD-10-CM | POA: Diagnosis not present

## 2024-06-14 DIAGNOSIS — K219 Gastro-esophageal reflux disease without esophagitis: Secondary | ICD-10-CM | POA: Diagnosis not present

## 2024-06-14 LAB — CBC WITH DIFFERENTIAL (CANCER CENTER ONLY)
Abs Immature Granulocytes: 0.2 K/uL — ABNORMAL HIGH (ref 0.00–0.07)
Basophils Absolute: 0.1 K/uL (ref 0.0–0.1)
Basophils Relative: 3 %
Eosinophils Absolute: 0.2 K/uL (ref 0.0–0.5)
Eosinophils Relative: 5 %
HCT: 21.1 % — ABNORMAL LOW (ref 39.0–52.0)
Hemoglobin: 7.1 g/dL — ABNORMAL LOW (ref 13.0–17.0)
Immature Granulocytes: 4 %
Lymphocytes Relative: 11 %
Lymphs Abs: 0.5 K/uL — ABNORMAL LOW (ref 0.7–4.0)
MCH: 32.7 pg (ref 26.0–34.0)
MCHC: 33.6 g/dL (ref 30.0–36.0)
MCV: 97.2 fL (ref 80.0–100.0)
Monocytes Absolute: 0.5 K/uL (ref 0.1–1.0)
Monocytes Relative: 10 %
Neutro Abs: 3.2 K/uL (ref 1.7–7.7)
Neutrophils Relative %: 67 %
Platelet Count: 198 K/uL (ref 150–400)
RBC: 2.17 MIL/uL — ABNORMAL LOW (ref 4.22–5.81)
RDW: 20.9 % — ABNORMAL HIGH (ref 11.5–15.5)
WBC Count: 4.8 K/uL (ref 4.0–10.5)
nRBC: 0 % (ref 0.0–0.2)

## 2024-06-14 LAB — SAMPLE TO BLOOD BANK

## 2024-06-14 LAB — PREPARE RBC (CROSSMATCH)

## 2024-06-14 MED ORDER — ACETAMINOPHEN 325 MG PO TABS
650.0000 mg | ORAL_TABLET | Freq: Once | ORAL | Status: AC
  Administered 2024-06-14: 650 mg via ORAL
  Filled 2024-06-14: qty 2

## 2024-06-14 MED ORDER — SODIUM CHLORIDE 0.9% IV SOLUTION
250.0000 mL | INTRAVENOUS | Status: DC
  Administered 2024-06-14: 100 mL via INTRAVENOUS

## 2024-06-14 MED ORDER — METHYLPREDNISOLONE SODIUM SUCC 40 MG IJ SOLR
40.0000 mg | Freq: Once | INTRAMUSCULAR | Status: AC
  Administered 2024-06-14: 40 mg via INTRAVENOUS
  Filled 2024-06-14: qty 1

## 2024-06-14 NOTE — Patient Instructions (Signed)

## 2024-06-15 ENCOUNTER — Other Ambulatory Visit (HOSPITAL_COMMUNITY): Payer: Self-pay

## 2024-06-15 LAB — BPAM RBC
Blood Product Expiration Date: 202509212359
ISSUE DATE / TIME: 202508281143
Unit Type and Rh: 6200

## 2024-06-15 LAB — TYPE AND SCREEN
ABO/RH(D): A POS
Antibody Screen: NEGATIVE
Unit division: 0

## 2024-06-17 DIAGNOSIS — E1165 Type 2 diabetes mellitus with hyperglycemia: Secondary | ICD-10-CM | POA: Diagnosis not present

## 2024-06-18 ENCOUNTER — Other Ambulatory Visit: Payer: Self-pay | Admitting: Hematology

## 2024-06-19 ENCOUNTER — Encounter: Payer: Self-pay | Admitting: Hematology

## 2024-06-20 ENCOUNTER — Other Ambulatory Visit: Payer: Self-pay

## 2024-06-21 ENCOUNTER — Other Ambulatory Visit: Payer: Self-pay

## 2024-06-21 DIAGNOSIS — D469 Myelodysplastic syndrome, unspecified: Secondary | ICD-10-CM

## 2024-06-22 ENCOUNTER — Inpatient Hospital Stay

## 2024-06-22 ENCOUNTER — Inpatient Hospital Stay: Attending: Hematology

## 2024-06-22 DIAGNOSIS — D539 Nutritional anemia, unspecified: Secondary | ICD-10-CM | POA: Diagnosis not present

## 2024-06-22 DIAGNOSIS — Z79899 Other long term (current) drug therapy: Secondary | ICD-10-CM | POA: Diagnosis not present

## 2024-06-22 DIAGNOSIS — E785 Hyperlipidemia, unspecified: Secondary | ICD-10-CM | POA: Insufficient documentation

## 2024-06-22 DIAGNOSIS — D464 Refractory anemia, unspecified: Secondary | ICD-10-CM | POA: Diagnosis not present

## 2024-06-22 DIAGNOSIS — Z87442 Personal history of urinary calculi: Secondary | ICD-10-CM | POA: Insufficient documentation

## 2024-06-22 DIAGNOSIS — K219 Gastro-esophageal reflux disease without esophagitis: Secondary | ICD-10-CM | POA: Insufficient documentation

## 2024-06-22 DIAGNOSIS — M1711 Unilateral primary osteoarthritis, right knee: Secondary | ICD-10-CM | POA: Diagnosis not present

## 2024-06-22 DIAGNOSIS — R5383 Other fatigue: Secondary | ICD-10-CM | POA: Insufficient documentation

## 2024-06-22 DIAGNOSIS — Z860101 Personal history of adenomatous and serrated colon polyps: Secondary | ICD-10-CM | POA: Insufficient documentation

## 2024-06-22 DIAGNOSIS — D72829 Elevated white blood cell count, unspecified: Secondary | ICD-10-CM | POA: Diagnosis not present

## 2024-06-22 DIAGNOSIS — M542 Cervicalgia: Secondary | ICD-10-CM | POA: Insufficient documentation

## 2024-06-22 DIAGNOSIS — D75839 Thrombocytosis, unspecified: Secondary | ICD-10-CM | POA: Insufficient documentation

## 2024-06-22 DIAGNOSIS — D469 Myelodysplastic syndrome, unspecified: Secondary | ICD-10-CM

## 2024-06-22 DIAGNOSIS — N529 Male erectile dysfunction, unspecified: Secondary | ICD-10-CM | POA: Insufficient documentation

## 2024-06-22 DIAGNOSIS — Z79624 Long term (current) use of inhibitors of nucleotide synthesis: Secondary | ICD-10-CM | POA: Insufficient documentation

## 2024-06-22 DIAGNOSIS — Z87891 Personal history of nicotine dependence: Secondary | ICD-10-CM | POA: Insufficient documentation

## 2024-06-22 DIAGNOSIS — Z8719 Personal history of other diseases of the digestive system: Secondary | ICD-10-CM | POA: Diagnosis not present

## 2024-06-22 DIAGNOSIS — D649 Anemia, unspecified: Secondary | ICD-10-CM

## 2024-06-22 LAB — CBC WITH DIFFERENTIAL (CANCER CENTER ONLY)
Abs Immature Granulocytes: 0.09 K/uL — ABNORMAL HIGH (ref 0.00–0.07)
Basophils Absolute: 0.2 K/uL — ABNORMAL HIGH (ref 0.0–0.1)
Basophils Relative: 4 %
Eosinophils Absolute: 0.2 K/uL (ref 0.0–0.5)
Eosinophils Relative: 4 %
HCT: 22.4 % — ABNORMAL LOW (ref 39.0–52.0)
Hemoglobin: 7.5 g/dL — ABNORMAL LOW (ref 13.0–17.0)
Immature Granulocytes: 2 %
Lymphocytes Relative: 11 %
Lymphs Abs: 0.5 K/uL — ABNORMAL LOW (ref 0.7–4.0)
MCH: 32.8 pg (ref 26.0–34.0)
MCHC: 33.5 g/dL (ref 30.0–36.0)
MCV: 97.8 fL (ref 80.0–100.0)
Monocytes Absolute: 0.4 K/uL (ref 0.1–1.0)
Monocytes Relative: 9 %
Neutro Abs: 3.3 K/uL (ref 1.7–7.7)
Neutrophils Relative %: 70 %
Platelet Count: 173 K/uL (ref 150–400)
RBC: 2.29 MIL/uL — ABNORMAL LOW (ref 4.22–5.81)
RDW: 20.8 % — ABNORMAL HIGH (ref 11.5–15.5)
WBC Count: 4.7 K/uL (ref 4.0–10.5)
nRBC: 0 % (ref 0.0–0.2)

## 2024-06-22 LAB — SAMPLE TO BLOOD BANK

## 2024-06-22 LAB — PREPARE RBC (CROSSMATCH)

## 2024-06-22 MED ORDER — ACETAMINOPHEN 325 MG PO TABS
650.0000 mg | ORAL_TABLET | Freq: Once | ORAL | Status: AC
  Administered 2024-06-22: 650 mg via ORAL
  Filled 2024-06-22: qty 2

## 2024-06-22 MED ORDER — SODIUM CHLORIDE 0.9% IV SOLUTION
250.0000 mL | INTRAVENOUS | Status: DC
  Administered 2024-06-22: 100 mL via INTRAVENOUS

## 2024-06-22 MED ORDER — METHYLPREDNISOLONE SODIUM SUCC 40 MG IJ SOLR
40.0000 mg | Freq: Once | INTRAMUSCULAR | Status: AC
  Administered 2024-06-22: 40 mg via INTRAVENOUS
  Filled 2024-06-22: qty 1

## 2024-06-22 NOTE — Patient Instructions (Signed)

## 2024-06-25 LAB — TYPE AND SCREEN
ABO/RH(D): A POS
Antibody Screen: NEGATIVE
Unit division: 0

## 2024-06-25 LAB — BPAM RBC
Blood Product Expiration Date: 202510052359
ISSUE DATE / TIME: 202509050930
Unit Type and Rh: 6200

## 2024-06-28 ENCOUNTER — Inpatient Hospital Stay

## 2024-06-28 ENCOUNTER — Other Ambulatory Visit: Payer: Self-pay

## 2024-06-28 DIAGNOSIS — D469 Myelodysplastic syndrome, unspecified: Secondary | ICD-10-CM

## 2024-06-28 NOTE — Progress Notes (Unsigned)
 HEMATOLOGY/ONCOLOGY PROGRESS NOTE:   Date of Service: 06/29/24   Patient Care Team: Dayna Motto, DO as PCP - General (Family Medicine)  CHIEF COMPLAINTS:  Follow-up for continued evaluation and management of JAK2 positive myeloproliferative neoplasm/MDS  INTERVAL HISTORY:  Fernando Lee is a 78 y.o. male here for continued evaluation and management of his MPN/MDS with refractory anemia and thrombocytosis. He was last seen by Dr. Onesimo on 05/18/2024. In the interim, he denies any changes to his health.   Fernando Lee reports having ongoing fatigue that is overall stable.  He adds that he is able to complete his ADLs on his own and yesterday was able to do some yard work.  He denies any changes to his appetite or weight.  He denies nausea, vomiting or bowel habit changes.  He denies easy bruising or signs of overt bleeding such as hematochezia or melena.  Patient is tolerating Vonjo  therapy without any significant toxicities.  He denies fevers, chills, night sweats, shortness of breath, chest pain or cough. He has no other complaints.   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   Internal hemorrhoids    Kidney stones    Male erectile dysfunction, unspecified    Mixed dyslipidemia    Mixed hyperlipidemia    Ocular migraine    PAC (premature atrial contraction)    Personal history of colonic polyps    Prediabetes    Residual hemorrhoidal skin tags    Spleen enlarged    nov 2023 hospitalized   Unspecified hemorrhoids     SURGICAL HISTORY: Past Surgical History:  Procedure Laterality Date   BONE MARROW BIOPSY     IR IMAGING GUIDED PORT INSERTION  09/14/2022    KIDNEY STONE SURGERY     retrieval    SOCIAL HISTORY: Social History   Socioeconomic History   Marital status: Married    Spouse name: Not on file   Number of children: Not on file   Years of education: Not on file   Highest education level: Not on file  Occupational History   Not on file  Tobacco Use   Smoking status: Former    Types: Cigarettes   Smokeless tobacco: Never  Substance and Sexual Activity   Alcohol use: Yes    Alcohol/week: 1.0 standard drink of alcohol    Types: 1 Cans of beer per week   Drug use: Not on file   Sexual activity: Not on file  Other Topics Concern   Not on file  Social History Narrative   Not on file   Social Drivers of Health   Financial Resource Strain: Not on file  Food Insecurity: Patient Declined (09/16/2023)   Hunger Vital Sign    Worried About Running Out of Food in the Last Year: Patient declined    Ran Out of Food in the Last Year: Patient declined  Transportation Needs: Patient Declined (09/16/2023)   PRAPARE - Administrator, Civil Service (Medical): Patient declined    Lack of Transportation (Non-Medical): Patient declined  Physical Activity: Not on file  Stress:  Not on file  Social Connections: Not on file  Intimate Partner Violence: Patient Declined (09/16/2023)   Humiliation, Afraid, Rape, and Kick questionnaire    Fear of Current or Ex-Partner: Patient declined    Emotionally Abused: Patient declined    Physically Abused: Patient declined    Sexually Abused: Patient declined    FAMILY HISTORY: Family History  Problem Relation Age of Onset   Stroke Brother    Congestive Heart Failure Brother     ALLERGIES:  is allergic to lactose intolerance (gi).  MEDICATIONS:  Current Outpatient Medications  Medication Sig Dispense Refill   acetaminophen  (TYLENOL ) 325 MG tablet Take 2 tablets (650 mg total) by mouth every 6 (six) hours as needed for mild pain (pain score 1-3) (or Fever >/= 101). 30 tablet 0    alfuzosin  (UROXATRAL ) 10 MG 24 hr tablet Take 10 mg by mouth daily with breakfast.     B Complex Vitamins (B COMPLEX PO) Take 1 tablet by mouth daily.     Cholecalciferol  (VITAMIN D3) 50 MCG (2000 UT) TABS Take 2,000 Units by mouth daily.     Deferasirox  (JADENU ) 180 MG TABS Take 2 tablets (360 mg total) by mouth daily. Take on an empty stomach. 60 tablet 2   docusate sodium  (COLACE) 100 MG capsule Take 100 mg by mouth 2 (two) times daily.     fish oil-omega-3 fatty acids  1000 MG capsule Take 1 g by mouth daily.     furosemide  (LASIX ) 20 MG tablet Take 1 tablet by mouth daily 30 tablet 0   GLUCOSAMINE CHONDROITIN MSM PO Take 1 tablet by mouth daily.     Multiple Vitamin (MULTIVITAMIN) tablet Take 1 tablet by mouth daily with breakfast.     pacritinib  citrate (VONJO ) 100 MG capsule Take 2 capsules (200 mg total) by mouth 2 (two) times daily. 120 capsule 2   potassium chloride  SA (KLOR-CON  M) 20 MEQ tablet Take 1 tablet (20 mEq total) by mouth 2 (two) times daily. 30 tablet 2   Turmeric (QC TUMERIC COMPLEX PO) Take 750 mg by mouth daily.     valACYclovir  (VALTREX ) 500 MG tablet Take 1 tablet (500 mg total) by mouth 2 (two) times daily. 60 tablet 5   vitamin C  (ASCORBIC ACID ) 250 MG tablet Take 500 mg by mouth daily.     No current facility-administered medications for this visit.    REVIEW OF SYSTEMS:    10 Point review of Systems was done is negative except as noted above.   PHYSICAL EXAMINATION: .BP (!) 101/57 (BP Location: Right Arm, Patient Position: Sitting) Comment: nurse is aware  Pulse 65   Temp 98.1 F (36.7 C) (Temporal)   Resp 17   Ht 5' 7 (1.702 m)   Wt 158 lb (71.7 kg)   SpO2 100%   BMI 24.75 kg/m   GENERAL:alert, in no acute distress and comfortable SKIN: no acute rashes, no significant lesions EYES: conjunctiva are pink and non-injected, sclera anicteric OROPHARYNX: MMM, no exudates, no oropharyngeal erythema or ulceration LUNGS: clear to auscultation b/l with  normal respiratory effort HEART: regular rate & rhythm Extremity: left greater than right ankle edema. Patient is wearing compression socks.  PSYCH: alert & oriented x 3 with fluent speech NEURO: no focal motor/sensory deficits   LABORATORY DATA:  I have reviewed the data as listed .    Latest Ref Rng & Units 06/29/2024    8:15 AM 06/22/2024    8:00 AM 06/14/2024   10:00 AM  CBC  WBC 4.0 - 10.5 K/uL 4.0  4.7  4.8   Hemoglobin 13.0 - 17.0 g/dL 7.4  7.5  7.1   Hematocrit 39.0 - 52.0 % 22.1  22.4  21.1   Platelets 150 - 400 K/uL 129  173  198      Iron /TIBC/Ferritin/ %Sat    Component Value Date/Time   IRON  178 03/28/2024 1446   TIBC 183 (L) 03/28/2024 1446   FERRITIN 5,406 (H) 03/28/2024 1446   IRONPCTSAT 97 (H) 03/28/2024 1446      Latest Ref Rng & Units 05/24/2024    7:47 AM 04/05/2024    8:39 AM 03/28/2024    2:46 PM  CMP  Glucose 70 - 99 mg/dL 754  792  872   BUN 8 - 23 mg/dL 22  19  21    Creatinine 0.61 - 1.24 mg/dL 8.84  9.02  8.75   Sodium 135 - 145 mmol/L 136  137  139   Potassium 3.5 - 5.1 mmol/L 4.1  4.3  4.4   Chloride 98 - 111 mmol/L 105  103  103   CO2 22 - 32 mmol/L 24  28  30    Calcium  8.9 - 10.3 mg/dL 8.7  8.6  8.9   Total Protein 6.5 - 8.1 g/dL 6.2  5.9  6.3   Total Bilirubin 0.0 - 1.2 mg/dL 1.0  1.2  0.9   Alkaline Phos 38 - 126 U/L 63  51  50   AST 15 - 41 U/L 23  16  19    ALT 0 - 44 U/L 59  34  42     Lab Results  Component Value Date   LDH 110 01/04/2022    02/23/2021 BCR ABL    02/23/2021 JAK2   Surgical Pathology  CASE: [id number]  PATIENT: Fernando Lee  Bone Marrow Report  Clinical History: MPN with progressive anemia  (BH)  DIAGNOSIS:   BONE MARROW, ASPIRATE, CLOT, CORE:  -Hypercellular bone marrow with features of myeloid neoplasm  -See comment   PERIPHERAL BLOOD:  -Macrocytic anemia  -Leukocytosis  -Thrombocytosis   COMMENT:   The bone marrow/peripheral blood show persistent involvement by  previously known myeloid  neoplasm.  There is a myeloproliferative  component as supported by previous JAK2 positivity.  However, there are  also dyspoietic changes primarily involving the megakaryocytic cell line  with numerous hypolobated/unilobated forms in addition to  dysgranulopoiesis to a lesser extent.  This is associated with  eosinophilia.  It is not entirely clear whether the overall findings  represent a myeloproliferative neoplasm with treatment related changes  or represent a primary myeloproliferative/myelodysplastic neoplasm  including but not limited to myeloid neoplasms with eosinophilia.  Correlation with cytogenetic and FISH studies strongly recommended.        ASSESSMENT & PLAN:  Fernando Lee is a 78 y.o. male who presents to the clinic for JAK2 positive MPN/MDS.   #JAK2-positive myeloproliferative neoplasm-primarily presenting with thrombocytosis and associated MDS causing refractory anemia --Initially presented with leukocytosis and thrombocytosis in 02/2021.  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   --Started treatment with Hydrea  and continued until March 2023 due to worsening anemia with hemoglobin decreased down to 6.6 at which point his hydrea  was discontinued.  Did not receive exogenous EPO because his baseline EPO level was 1227.1.   ---Tried luspatarcept but cytopenias did not improve. Then received 7 cycles of Vidaza  without improvement.  Due to progressive fibrosis noted on marrow, he  was told to stop Vidaza . --On June 20th, 2024, started Lenalidomide  with no improvement of Hgb and instead developed a thrombocytopenia. For this reason Lenalidomide  was stopped on September 2024. He has remained on ASA 81 mg.  --Started Pacritinib  therapy 07/20/2023.   PLAN: -Reviewed labs from today that show WBC 4.0, Hgb 7.4, MCV 96.5, Plt 129K.  -Since Hgb <7.5, proceed with 1 unit of PRBC today -Continue Vonjo  200 mg po BID -Continue  Jadenu  2 tablets (360 MG total) p.o. daily  -Minimize iron  rich foods and iron  supplementation -Iron  panel pending to evaluate response to iron  chelation.   Follow-up: -Weekly labs and PRBC transfusion x 12 appointments -Follow up visit with Dr. Onesimo in 6 weeks   All of the patient's questions were answered with apparent satisfaction. The patient knows to call the clinic with any problems, questions or concerns.   I have spent a total of 30 minutes minutes of face-to-face and non-face-to-face time, preparing to see the patient,  performing a medically appropriate examination, counseling and educating the patient, ordering medications/tests/procedures,documenting clinical information in the electronic health record, independently interpreting results and communicating results to the patient, and care coordination.   Fernando Lee Dept of Hematology and Oncology [facility] Phone: [phone]

## 2024-06-29 ENCOUNTER — Inpatient Hospital Stay

## 2024-06-29 ENCOUNTER — Inpatient Hospital Stay: Admitting: Physician Assistant

## 2024-06-29 ENCOUNTER — Other Ambulatory Visit: Payer: Self-pay

## 2024-06-29 VITALS — BP 101/57 | HR 65 | Temp 98.1°F | Resp 17 | Ht 67.0 in | Wt 158.0 lb

## 2024-06-29 DIAGNOSIS — D464 Refractory anemia, unspecified: Secondary | ICD-10-CM | POA: Diagnosis not present

## 2024-06-29 DIAGNOSIS — K219 Gastro-esophageal reflux disease without esophagitis: Secondary | ICD-10-CM | POA: Diagnosis not present

## 2024-06-29 DIAGNOSIS — Z87891 Personal history of nicotine dependence: Secondary | ICD-10-CM | POA: Diagnosis not present

## 2024-06-29 DIAGNOSIS — Z79899 Other long term (current) drug therapy: Secondary | ICD-10-CM | POA: Diagnosis not present

## 2024-06-29 DIAGNOSIS — D539 Nutritional anemia, unspecified: Secondary | ICD-10-CM | POA: Diagnosis not present

## 2024-06-29 DIAGNOSIS — R5383 Other fatigue: Secondary | ICD-10-CM | POA: Diagnosis not present

## 2024-06-29 DIAGNOSIS — M1711 Unilateral primary osteoarthritis, right knee: Secondary | ICD-10-CM | POA: Diagnosis not present

## 2024-06-29 DIAGNOSIS — D469 Myelodysplastic syndrome, unspecified: Secondary | ICD-10-CM

## 2024-06-29 DIAGNOSIS — D649 Anemia, unspecified: Secondary | ICD-10-CM

## 2024-06-29 DIAGNOSIS — D75839 Thrombocytosis, unspecified: Secondary | ICD-10-CM | POA: Diagnosis not present

## 2024-06-29 DIAGNOSIS — E785 Hyperlipidemia, unspecified: Secondary | ICD-10-CM | POA: Diagnosis not present

## 2024-06-29 DIAGNOSIS — M542 Cervicalgia: Secondary | ICD-10-CM | POA: Diagnosis not present

## 2024-06-29 DIAGNOSIS — Z79624 Long term (current) use of inhibitors of nucleotide synthesis: Secondary | ICD-10-CM | POA: Diagnosis not present

## 2024-06-29 DIAGNOSIS — Z8719 Personal history of other diseases of the digestive system: Secondary | ICD-10-CM | POA: Diagnosis not present

## 2024-06-29 DIAGNOSIS — Z87442 Personal history of urinary calculi: Secondary | ICD-10-CM | POA: Diagnosis not present

## 2024-06-29 DIAGNOSIS — D72829 Elevated white blood cell count, unspecified: Secondary | ICD-10-CM | POA: Diagnosis not present

## 2024-06-29 DIAGNOSIS — Z860101 Personal history of adenomatous and serrated colon polyps: Secondary | ICD-10-CM | POA: Diagnosis not present

## 2024-06-29 LAB — IRON AND IRON BINDING CAPACITY (CC-WL,HP ONLY)
Iron: 157 ug/dL (ref 45–182)
Saturation Ratios: 104 % — ABNORMAL HIGH (ref 17.9–39.5)
TIBC: 151 ug/dL — ABNORMAL LOW (ref 250–450)
UIBC: UNDETERMINED ug/dL (ref 117–376)

## 2024-06-29 LAB — CBC WITH DIFFERENTIAL (CANCER CENTER ONLY)
Abs Immature Granulocytes: 0.13 K/uL — ABNORMAL HIGH (ref 0.00–0.07)
Basophils Absolute: 0.1 K/uL (ref 0.0–0.1)
Basophils Relative: 3 %
Eosinophils Absolute: 0.2 K/uL (ref 0.0–0.5)
Eosinophils Relative: 5 %
HCT: 22.1 % — ABNORMAL LOW (ref 39.0–52.0)
Hemoglobin: 7.4 g/dL — ABNORMAL LOW (ref 13.0–17.0)
Immature Granulocytes: 3 %
Lymphocytes Relative: 11 %
Lymphs Abs: 0.5 K/uL — ABNORMAL LOW (ref 0.7–4.0)
MCH: 32.3 pg (ref 26.0–34.0)
MCHC: 33.5 g/dL (ref 30.0–36.0)
MCV: 96.5 fL (ref 80.0–100.0)
Monocytes Absolute: 0.3 K/uL (ref 0.1–1.0)
Monocytes Relative: 8 %
Neutro Abs: 2.8 K/uL (ref 1.7–7.7)
Neutrophils Relative %: 70 %
Platelet Count: 129 K/uL — ABNORMAL LOW (ref 150–400)
RBC: 2.29 MIL/uL — ABNORMAL LOW (ref 4.22–5.81)
RDW: 20.3 % — ABNORMAL HIGH (ref 11.5–15.5)
WBC Count: 4 K/uL (ref 4.0–10.5)
nRBC: 0 % (ref 0.0–0.2)

## 2024-06-29 LAB — FERRITIN: Ferritin: 5454 ng/mL — ABNORMAL HIGH (ref 24–336)

## 2024-06-29 LAB — SAMPLE TO BLOOD BANK

## 2024-06-29 LAB — PREPARE RBC (CROSSMATCH)

## 2024-06-29 MED ORDER — ACETAMINOPHEN 325 MG PO TABS
650.0000 mg | ORAL_TABLET | Freq: Once | ORAL | Status: AC
  Administered 2024-06-29: 650 mg via ORAL
  Filled 2024-06-29: qty 2

## 2024-06-29 MED ORDER — METHYLPREDNISOLONE SODIUM SUCC 40 MG IJ SOLR
40.0000 mg | Freq: Once | INTRAMUSCULAR | Status: AC
  Administered 2024-06-29: 40 mg via INTRAVENOUS
  Filled 2024-06-29: qty 1

## 2024-06-29 MED ORDER — SODIUM CHLORIDE 0.9% IV SOLUTION
250.0000 mL | INTRAVENOUS | Status: DC
  Administered 2024-06-29: 100 mL via INTRAVENOUS

## 2024-07-02 ENCOUNTER — Other Ambulatory Visit: Payer: Self-pay

## 2024-07-02 LAB — TYPE AND SCREEN
ABO/RH(D): A POS
Antibody Screen: NEGATIVE
Unit division: 0

## 2024-07-02 LAB — BPAM RBC
Blood Product Expiration Date: 202510112359
ISSUE DATE / TIME: 202509121002
Unit Type and Rh: 6200

## 2024-07-04 ENCOUNTER — Other Ambulatory Visit: Payer: Self-pay | Admitting: Pharmacy Technician

## 2024-07-04 ENCOUNTER — Other Ambulatory Visit: Payer: Self-pay

## 2024-07-04 NOTE — Progress Notes (Signed)
 Specialty Pharmacy Refill Coordination Note  Fernando Lee is a 78 y.o. male contacted today regarding refills of specialty medication(s) Deferasirox ; Pacritinib  Citrate (Vonjo )   Patient requested [name] at [facility] at [place] date: 07/06/24   Medication will be filled on 07/05/24.

## 2024-07-05 ENCOUNTER — Other Ambulatory Visit: Payer: Self-pay

## 2024-07-05 ENCOUNTER — Inpatient Hospital Stay

## 2024-07-05 DIAGNOSIS — D75839 Thrombocytosis, unspecified: Secondary | ICD-10-CM | POA: Diagnosis not present

## 2024-07-05 DIAGNOSIS — D469 Myelodysplastic syndrome, unspecified: Secondary | ICD-10-CM

## 2024-07-05 DIAGNOSIS — R5383 Other fatigue: Secondary | ICD-10-CM | POA: Diagnosis not present

## 2024-07-05 DIAGNOSIS — M1711 Unilateral primary osteoarthritis, right knee: Secondary | ICD-10-CM | POA: Diagnosis not present

## 2024-07-05 DIAGNOSIS — M542 Cervicalgia: Secondary | ICD-10-CM | POA: Diagnosis not present

## 2024-07-05 DIAGNOSIS — Z87891 Personal history of nicotine dependence: Secondary | ICD-10-CM | POA: Diagnosis not present

## 2024-07-05 DIAGNOSIS — Z860101 Personal history of adenomatous and serrated colon polyps: Secondary | ICD-10-CM | POA: Diagnosis not present

## 2024-07-05 DIAGNOSIS — D649 Anemia, unspecified: Secondary | ICD-10-CM

## 2024-07-05 DIAGNOSIS — Z79624 Long term (current) use of inhibitors of nucleotide synthesis: Secondary | ICD-10-CM | POA: Diagnosis not present

## 2024-07-05 DIAGNOSIS — Z79899 Other long term (current) drug therapy: Secondary | ICD-10-CM | POA: Diagnosis not present

## 2024-07-05 DIAGNOSIS — D464 Refractory anemia, unspecified: Secondary | ICD-10-CM | POA: Diagnosis not present

## 2024-07-05 DIAGNOSIS — E785 Hyperlipidemia, unspecified: Secondary | ICD-10-CM | POA: Diagnosis not present

## 2024-07-05 DIAGNOSIS — D72829 Elevated white blood cell count, unspecified: Secondary | ICD-10-CM | POA: Diagnosis not present

## 2024-07-05 DIAGNOSIS — D539 Nutritional anemia, unspecified: Secondary | ICD-10-CM | POA: Diagnosis not present

## 2024-07-05 DIAGNOSIS — K219 Gastro-esophageal reflux disease without esophagitis: Secondary | ICD-10-CM | POA: Diagnosis not present

## 2024-07-05 DIAGNOSIS — Z87442 Personal history of urinary calculi: Secondary | ICD-10-CM | POA: Diagnosis not present

## 2024-07-05 DIAGNOSIS — Z8719 Personal history of other diseases of the digestive system: Secondary | ICD-10-CM | POA: Diagnosis not present

## 2024-07-05 LAB — CBC WITH DIFFERENTIAL (CANCER CENTER ONLY)
Abs Immature Granulocytes: 0.17 K/uL — ABNORMAL HIGH (ref 0.00–0.07)
Basophils Absolute: 0.1 K/uL (ref 0.0–0.1)
Basophils Relative: 3 %
Eosinophils Absolute: 0.2 K/uL (ref 0.0–0.5)
Eosinophils Relative: 5 %
HCT: 23.2 % — ABNORMAL LOW (ref 39.0–52.0)
Hemoglobin: 7.6 g/dL — ABNORMAL LOW (ref 13.0–17.0)
Immature Granulocytes: 4 %
Lymphocytes Relative: 10 %
Lymphs Abs: 0.5 K/uL — ABNORMAL LOW (ref 0.7–4.0)
MCH: 32.1 pg (ref 26.0–34.0)
MCHC: 32.8 g/dL (ref 30.0–36.0)
MCV: 97.9 fL (ref 80.0–100.0)
Monocytes Absolute: 0.5 K/uL (ref 0.1–1.0)
Monocytes Relative: 10 %
Neutro Abs: 3 K/uL (ref 1.7–7.7)
Neutrophils Relative %: 68 %
Platelet Count: 125 K/uL — ABNORMAL LOW (ref 150–400)
RBC: 2.37 MIL/uL — ABNORMAL LOW (ref 4.22–5.81)
RDW: 20.3 % — ABNORMAL HIGH (ref 11.5–15.5)
WBC Count: 4.4 K/uL (ref 4.0–10.5)
nRBC: 0 % (ref 0.0–0.2)

## 2024-07-05 LAB — SAMPLE TO BLOOD BANK

## 2024-07-05 NOTE — Progress Notes (Signed)
 Patient presents to infusion today for 1 unit pRBC. Hgb is 7.6, per his parameters 1 unit is needed for hgb < 7.5. Patient states he is asymptomatic and also has an appt with [facility] (Dr Reyna) on Monday 07/09/24 where they will give him blood also.  This RN reached out to [place], [place] and provided above information, per [place] okay to not give pt blood today. Patient is agreeable with plan and will follow up as needed.  De-accessed patient's port and discharged home ambulatory to lobby with no further questions.

## 2024-07-07 ENCOUNTER — Other Ambulatory Visit (HOSPITAL_COMMUNITY): Payer: Self-pay

## 2024-07-09 ENCOUNTER — Other Ambulatory Visit: Payer: Self-pay

## 2024-07-09 DIAGNOSIS — C946 Myelodysplastic disease, not classified: Secondary | ICD-10-CM | POA: Diagnosis not present

## 2024-07-12 ENCOUNTER — Inpatient Hospital Stay

## 2024-07-17 ENCOUNTER — Other Ambulatory Visit: Payer: Self-pay

## 2024-07-17 ENCOUNTER — Other Ambulatory Visit: Payer: Self-pay | Admitting: Hematology

## 2024-07-17 NOTE — Progress Notes (Signed)
 Specialty Pharmacy Ongoing Clinical Assessment Note  Fernando Lee is a 78 y.o. male who is being followed by the specialty pharmacy service for RxSp Oncology   Patient's specialty medication(s) reviewed today: Deferasirox ; Pacritinib  Citrate (Vonjo )   Missed doses in the last 4 weeks: 0   Patient/Caregiver did not have any additional questions or concerns.   Therapeutic benefit summary: Patient is achieving benefit   Adverse events/side effects summary: No adverse events/side effects   Patient's therapy is appropriate to: Continue    Goals Addressed             This Visit's Progress    Laboratory improvement   On track    For Jadenu , Patient is on track. Patient will maintain adherence. Patient's lab results on 05/18/2024: CBC showed WBC of 5.8K, hemoglobin of 7.8, and platelets of 145, showing mild improvement.      Slow Disease Progression   On track    Patient is on track. Patient will maintain adherence. Patient's lab results on 05/18/2024: CBC showed WBC of 5.8K, hemoglobin of 7.8, and platelets of 145, showing mild improvement.          Follow up: 3 months  Silvano LOISE Dolly Specialty Pharmacist

## 2024-07-19 ENCOUNTER — Inpatient Hospital Stay

## 2024-07-19 ENCOUNTER — Inpatient Hospital Stay: Attending: Hematology

## 2024-07-19 DIAGNOSIS — E785 Hyperlipidemia, unspecified: Secondary | ICD-10-CM | POA: Diagnosis not present

## 2024-07-19 DIAGNOSIS — K55059 Acute (reversible) ischemia of intestine, part and extent unspecified: Secondary | ICD-10-CM | POA: Diagnosis not present

## 2024-07-19 DIAGNOSIS — R609 Edema, unspecified: Secondary | ICD-10-CM | POA: Diagnosis not present

## 2024-07-19 DIAGNOSIS — Z79899 Other long term (current) drug therapy: Secondary | ICD-10-CM | POA: Insufficient documentation

## 2024-07-19 DIAGNOSIS — Z8719 Personal history of other diseases of the digestive system: Secondary | ICD-10-CM | POA: Insufficient documentation

## 2024-07-19 DIAGNOSIS — R5383 Other fatigue: Secondary | ICD-10-CM | POA: Diagnosis not present

## 2024-07-19 DIAGNOSIS — E782 Mixed hyperlipidemia: Secondary | ICD-10-CM | POA: Insufficient documentation

## 2024-07-19 DIAGNOSIS — D630 Anemia in neoplastic disease: Secondary | ICD-10-CM | POA: Insufficient documentation

## 2024-07-19 DIAGNOSIS — D696 Thrombocytopenia, unspecified: Secondary | ICD-10-CM | POA: Diagnosis not present

## 2024-07-19 DIAGNOSIS — K219 Gastro-esophageal reflux disease without esophagitis: Secondary | ICD-10-CM | POA: Diagnosis not present

## 2024-07-19 DIAGNOSIS — I499 Cardiac arrhythmia, unspecified: Secondary | ICD-10-CM | POA: Diagnosis not present

## 2024-07-19 DIAGNOSIS — D464 Refractory anemia, unspecified: Secondary | ICD-10-CM | POA: Insufficient documentation

## 2024-07-19 DIAGNOSIS — D75839 Thrombocytosis, unspecified: Secondary | ICD-10-CM | POA: Insufficient documentation

## 2024-07-19 DIAGNOSIS — D539 Nutritional anemia, unspecified: Secondary | ICD-10-CM | POA: Diagnosis not present

## 2024-07-19 DIAGNOSIS — I5030 Unspecified diastolic (congestive) heart failure: Secondary | ICD-10-CM | POA: Insufficient documentation

## 2024-07-19 DIAGNOSIS — Z87891 Personal history of nicotine dependence: Secondary | ICD-10-CM | POA: Insufficient documentation

## 2024-07-19 DIAGNOSIS — Z860100 Personal history of colon polyps, unspecified: Secondary | ICD-10-CM | POA: Diagnosis not present

## 2024-07-19 DIAGNOSIS — R17 Unspecified jaundice: Secondary | ICD-10-CM | POA: Diagnosis not present

## 2024-07-19 DIAGNOSIS — R161 Splenomegaly, not elsewhere classified: Secondary | ICD-10-CM | POA: Insufficient documentation

## 2024-07-19 DIAGNOSIS — D72829 Elevated white blood cell count, unspecified: Secondary | ICD-10-CM | POA: Diagnosis not present

## 2024-07-19 DIAGNOSIS — J9811 Atelectasis: Secondary | ICD-10-CM | POA: Insufficient documentation

## 2024-07-19 DIAGNOSIS — M1711 Unilateral primary osteoarthritis, right knee: Secondary | ICD-10-CM | POA: Insufficient documentation

## 2024-07-19 DIAGNOSIS — Z79624 Long term (current) use of inhibitors of nucleotide synthesis: Secondary | ICD-10-CM | POA: Insufficient documentation

## 2024-07-19 DIAGNOSIS — Z800 Family history of malignant neoplasm of digestive organs: Secondary | ICD-10-CM | POA: Insufficient documentation

## 2024-07-19 DIAGNOSIS — Z87442 Personal history of urinary calculi: Secondary | ICD-10-CM | POA: Insufficient documentation

## 2024-07-19 DIAGNOSIS — D469 Myelodysplastic syndrome, unspecified: Secondary | ICD-10-CM

## 2024-07-19 LAB — CBC WITH DIFFERENTIAL (CANCER CENTER ONLY)
Abs Immature Granulocytes: 0.14 K/uL — ABNORMAL HIGH (ref 0.00–0.07)
Basophils Absolute: 0.2 K/uL — ABNORMAL HIGH (ref 0.0–0.1)
Basophils Relative: 4 %
Eosinophils Absolute: 0.3 K/uL (ref 0.0–0.5)
Eosinophils Relative: 5 %
HCT: 25.8 % — ABNORMAL LOW (ref 39.0–52.0)
Hemoglobin: 8.4 g/dL — ABNORMAL LOW (ref 13.0–17.0)
Immature Granulocytes: 3 %
Lymphocytes Relative: 12 %
Lymphs Abs: 0.7 K/uL (ref 0.7–4.0)
MCH: 31.8 pg (ref 26.0–34.0)
MCHC: 32.6 g/dL (ref 30.0–36.0)
MCV: 97.7 fL (ref 80.0–100.0)
Monocytes Absolute: 0.9 K/uL (ref 0.1–1.0)
Monocytes Relative: 16 %
Neutro Abs: 3.3 K/uL (ref 1.7–7.7)
Neutrophils Relative %: 60 %
Platelet Count: 105 K/uL — ABNORMAL LOW (ref 150–400)
RBC: 2.64 MIL/uL — ABNORMAL LOW (ref 4.22–5.81)
RDW: 21 % — ABNORMAL HIGH (ref 11.5–15.5)
WBC Count: 5.4 K/uL (ref 4.0–10.5)
nRBC: 0 % (ref 0.0–0.2)

## 2024-07-19 LAB — SAMPLE TO BLOOD BANK

## 2024-07-19 NOTE — Progress Notes (Signed)
 Pt arrived today for 1 unit of blood. Parameter to receive blood if Hgb <7.5. Hgb is 8.4 today, no unit needed. Ambulatory to lobby.

## 2024-07-19 NOTE — Patient Instructions (Signed)

## 2024-07-23 ENCOUNTER — Other Ambulatory Visit: Payer: Self-pay | Admitting: Hematology

## 2024-07-23 DIAGNOSIS — D469 Myelodysplastic syndrome, unspecified: Secondary | ICD-10-CM

## 2024-07-24 ENCOUNTER — Encounter: Payer: Self-pay | Admitting: Hematology

## 2024-07-26 ENCOUNTER — Inpatient Hospital Stay

## 2024-07-26 ENCOUNTER — Other Ambulatory Visit: Payer: Self-pay | Admitting: *Deleted

## 2024-07-26 DIAGNOSIS — D469 Myelodysplastic syndrome, unspecified: Secondary | ICD-10-CM

## 2024-07-26 DIAGNOSIS — D464 Refractory anemia, unspecified: Secondary | ICD-10-CM | POA: Diagnosis not present

## 2024-07-26 LAB — CBC WITH DIFFERENTIAL (CANCER CENTER ONLY)
Abs Immature Granulocytes: 0.21 K/uL — ABNORMAL HIGH (ref 0.00–0.07)
Basophils Absolute: 0.2 K/uL — ABNORMAL HIGH (ref 0.0–0.1)
Basophils Relative: 3 %
Eosinophils Absolute: 0.3 K/uL (ref 0.0–0.5)
Eosinophils Relative: 6 %
HCT: 21 % — ABNORMAL LOW (ref 39.0–52.0)
Hemoglobin: 7 g/dL — ABNORMAL LOW (ref 13.0–17.0)
Immature Granulocytes: 4 %
Lymphocytes Relative: 11 %
Lymphs Abs: 0.6 K/uL — ABNORMAL LOW (ref 0.7–4.0)
MCH: 33 pg (ref 26.0–34.0)
MCHC: 33.3 g/dL (ref 30.0–36.0)
MCV: 99.1 fL (ref 80.0–100.0)
Monocytes Absolute: 1 K/uL (ref 0.1–1.0)
Monocytes Relative: 18 %
Neutro Abs: 3.2 K/uL (ref 1.7–7.7)
Neutrophils Relative %: 58 %
Platelet Count: 105 K/uL — ABNORMAL LOW (ref 150–400)
RBC: 2.12 MIL/uL — ABNORMAL LOW (ref 4.22–5.81)
RDW: 22.8 % — ABNORMAL HIGH (ref 11.5–15.5)
WBC Count: 5.4 K/uL (ref 4.0–10.5)
nRBC: 0 % (ref 0.0–0.2)

## 2024-07-26 LAB — SAMPLE TO BLOOD BANK

## 2024-07-26 LAB — PREPARE RBC (CROSSMATCH)

## 2024-07-26 MED ORDER — METHYLPREDNISOLONE SODIUM SUCC 40 MG IJ SOLR
40.0000 mg | Freq: Once | INTRAMUSCULAR | Status: AC
  Administered 2024-07-26: 40 mg via INTRAVENOUS
  Filled 2024-07-26: qty 1

## 2024-07-26 MED ORDER — SODIUM CHLORIDE 0.9% IV SOLUTION
250.0000 mL | INTRAVENOUS | Status: DC
  Administered 2024-07-26: 100 mL via INTRAVENOUS

## 2024-07-26 MED ORDER — ACETAMINOPHEN 325 MG PO TABS
650.0000 mg | ORAL_TABLET | Freq: Once | ORAL | Status: AC
  Administered 2024-07-26: 650 mg via ORAL
  Filled 2024-07-26: qty 2

## 2024-07-26 NOTE — Patient Instructions (Signed)
 Blood Transfusion, Adult A blood transfusion is a procedure in which you receive blood or a type of blood cell (blood component) through an IV. You may need a blood transfusion when you have a low blood count, which is a low number of any blood cell. This may result from a bleeding disorder, illness, injury, or surgery. The blood may come from a donor, or you may be able to have your own blood collected and stored (autologous blood donation) before a planned surgery. The blood given in a transfusion may be made up of different blood components. You may receive: Red blood cells. These carry oxygen to the cells in the body. Platelets. These help your blood to clot. Plasma. This is the liquid part of your blood. It carries proteins and other substances throughout the body. White blood cells. These help you fight infections. If you have hemophilia or another clotting disorder, you may also receive other types of blood products. Depending on the type of blood product, this procedure may take 1-4 hours to complete. Tell a health care provider about: Any bleeding problems you have. Any previous reactions you have had during a blood transfusion. Any allergies you have. All medicines you are taking, including vitamins, herbs, eye drops, creams, and over-the-counter medicines. Any surgeries you have had. Any medical conditions you have. Whether you are pregnant or may be pregnant. What are the risks? Talk with your health care provider about risks. The most common problems include: A mild allergic reaction, such as red, swollen areas of skin (hives) and itching. Fever or chills. This may be the body's response to new blood cells received. This may occur during or up to 4 hours after the transfusion. More serious problems may include: A serious allergic reaction that causes difficulty breathing or swelling around the face and lips. Transfusion-associated circulatory overload (TACO), or too much fluid in  the lungs. This may cause breathing problems. Transfusion-related acute lung injury (TRALI), which causes breathing difficulty and low oxygen in the blood. This can occur within hours of the transfusion or several days later. Iron overload. This can happen after receiving many blood transfusions over a period of time. Infection or virus being transmitted. This is rare because donated blood is carefully tested before it is given. Hemolytic transfusion reaction. This is rare. It happens when the body's defense system (immune system)tries to attack the new blood cells. Symptoms may include fever, chills, nausea, low blood pressure, and low back or chest pain. Transfusion-associated graft-versus-host disease (TAGVHD). This is rare. It happens when donated cells attack the body's healthy tissues. What happens before the procedure? You will have a blood test to check your blood type. This test is done to know what kind of blood your body will accept and to match it to the donor blood. If you are going to have a planned surgery, you may be able to do an autologous blood donation. This may be done in case you need to have a transfusion. You will have your temperature, blood pressure, and pulse checked before the transfusion. If you have had an allergic reaction to a transfusion in the past, you may be given medicine to help prevent a reaction. This medicine may be given to you by mouth (orally) or through an IV. What happens during the procedure?  An IV will be inserted into one of your veins. The bag of blood will be attached to your IV. The blood will then enter through your vein. Your temperature, blood pressure,  and pulse will be monitored during the transfusion. This monitoring is done to detect early signs of a transfusion reaction. Tell your nurse right away if you have any of these symptoms during the transfusion: Shortness of breath or trouble breathing. Chest or back pain. Fever or  chills. Itching or hives. If you have any signs or symptoms of a reaction, your transfusion will be stopped and you may be given medicine. When the transfusion is complete, your IV will be removed. Pressure may be applied to the IV site for a few minutes. A bandage (dressing)will be applied. The procedure may vary among health care providers and hospitals. What happens after the procedure? Your temperature, blood pressure, pulse, breathing rate, and blood oxygen level will be monitored until you leave the hospital or clinic. Your blood may be tested to see how you have responded to the transfusion. You may be warmed with fluids or blankets to maintain a normal body temperature. If you receive your blood transfusion in an outpatient setting, you will be told whom to contact to report any reactions. Where to find more information Visit the [employer]: redcross.org Summary A blood transfusion is a procedure in which you receive blood or a type of blood cell (blood component) through an IV. The blood given in a transfusion may be made up of different blood components. You may receive red blood cells, platelets, plasma, or white blood cells depending on the condition treated. Your temperature, blood pressure, and pulse will be monitored before, during, and after the transfusion. After the transfusion, your blood may be tested to see how your body has responded. This information is not intended to replace advice given to you by your health care provider. Make sure you discuss any questions you have with your health care provider. Document Revised: 01/01/2022 Document Reviewed: 01/01/2022 Elsevier Patient Education  2024 [employer].

## 2024-07-27 LAB — BPAM RBC
Blood Product Expiration Date: 202510292359
ISSUE DATE / TIME: 202510090956
Unit Type and Rh: 6200

## 2024-07-27 LAB — TYPE AND SCREEN
ABO/RH(D): A POS
Antibody Screen: NEGATIVE
Unit division: 0

## 2024-07-30 ENCOUNTER — Other Ambulatory Visit: Payer: Self-pay

## 2024-07-30 ENCOUNTER — Other Ambulatory Visit: Payer: Self-pay | Admitting: Hematology

## 2024-07-30 ENCOUNTER — Other Ambulatory Visit (HOSPITAL_COMMUNITY): Payer: Self-pay

## 2024-07-30 DIAGNOSIS — D469 Myelodysplastic syndrome, unspecified: Secondary | ICD-10-CM

## 2024-07-30 DIAGNOSIS — D471 Chronic myeloproliferative disease: Secondary | ICD-10-CM

## 2024-07-30 MED ORDER — VONJO 100 MG PO CAPS
200.0000 mg | ORAL_CAPSULE | Freq: Two times a day (BID) | ORAL | 2 refills | Status: DC
  Filled 2024-07-30: qty 120, 30d supply, fill #0

## 2024-07-30 NOTE — Progress Notes (Signed)
 Specialty Pharmacy Refill Coordination Note  Fernando Lee is a 78 y.o. male contacted today regarding refills of specialty medication(s) Deferasirox ; Pacritinib  Citrate (Vonjo )   Patient requested [name] at [facility] at [place] date: 08/02/24   Medication will be filled on 08/01/24.

## 2024-07-31 ENCOUNTER — Other Ambulatory Visit: Payer: Self-pay

## 2024-08-02 ENCOUNTER — Other Ambulatory Visit: Payer: Self-pay

## 2024-08-02 ENCOUNTER — Inpatient Hospital Stay

## 2024-08-02 VITALS — BP 103/59 | HR 73 | Temp 97.8°F | Resp 20

## 2024-08-02 DIAGNOSIS — D464 Refractory anemia, unspecified: Secondary | ICD-10-CM | POA: Diagnosis not present

## 2024-08-02 DIAGNOSIS — D469 Myelodysplastic syndrome, unspecified: Secondary | ICD-10-CM

## 2024-08-02 LAB — CBC WITH DIFFERENTIAL (CANCER CENTER ONLY)
Abs Immature Granulocytes: 0.19 K/uL — ABNORMAL HIGH (ref 0.00–0.07)
Basophils Absolute: 0.1 K/uL (ref 0.0–0.1)
Basophils Relative: 2 %
Eosinophils Absolute: 0.5 K/uL (ref 0.0–0.5)
Eosinophils Relative: 7 %
HCT: 18.1 % — ABNORMAL LOW (ref 39.0–52.0)
Hemoglobin: 6 g/dL — CL (ref 13.0–17.0)
Immature Granulocytes: 3 %
Lymphocytes Relative: 12 %
Lymphs Abs: 0.9 K/uL (ref 0.7–4.0)
MCH: 33.5 pg (ref 26.0–34.0)
MCHC: 33.1 g/dL (ref 30.0–36.0)
MCV: 101.1 fL — ABNORMAL HIGH (ref 80.0–100.0)
Monocytes Absolute: 1.5 K/uL — ABNORMAL HIGH (ref 0.1–1.0)
Monocytes Relative: 22 %
Neutro Abs: 3.8 K/uL (ref 1.7–7.7)
Neutrophils Relative %: 54 %
Platelet Count: 69 K/uL — ABNORMAL LOW (ref 150–400)
RBC: 1.79 MIL/uL — ABNORMAL LOW (ref 4.22–5.81)
RDW: 23.2 % — ABNORMAL HIGH (ref 11.5–15.5)
WBC Count: 6.9 K/uL (ref 4.0–10.5)
nRBC: 0 % (ref 0.0–0.2)

## 2024-08-02 LAB — SAMPLE TO BLOOD BANK

## 2024-08-02 LAB — PREPARE RBC (CROSSMATCH)

## 2024-08-02 MED ORDER — METHYLPREDNISOLONE SODIUM SUCC 40 MG IJ SOLR
40.0000 mg | Freq: Once | INTRAMUSCULAR | Status: AC
  Administered 2024-08-02: 40 mg via INTRAVENOUS
  Filled 2024-08-02: qty 1

## 2024-08-02 MED ORDER — ACETAMINOPHEN 325 MG PO TABS: 650.0000 mg | ORAL_TABLET | Freq: Once | ORAL | Status: DC

## 2024-08-02 MED ORDER — ACETAMINOPHEN 325 MG PO TABS
650.0000 mg | ORAL_TABLET | Freq: Once | ORAL | Status: AC
  Administered 2024-08-02: 650 mg via ORAL
  Filled 2024-08-02: qty 2

## 2024-08-02 MED ORDER — SODIUM CHLORIDE 0.9% FLUSH
10.0000 mL | Freq: Once | INTRAVENOUS | Status: AC
  Administered 2024-08-02: 10 mL

## 2024-08-02 MED ORDER — SODIUM CHLORIDE 0.9 % IV SOLN: Freq: Once | INTRAVENOUS | Status: AC

## 2024-08-02 MED ORDER — METHYLPREDNISOLONE SODIUM SUCC 40 MG IJ SOLR: 40.0000 mg | Freq: Once | INTRAMUSCULAR | Status: DC

## 2024-08-02 MED ORDER — SODIUM CHLORIDE 0.9% IV SOLUTION: 250.0000 mL | INTRAVENOUS | Status: DC

## 2024-08-02 NOTE — Patient Instructions (Signed)

## 2024-08-02 NOTE — Progress Notes (Signed)
 Per charge nurse, okay to leave patient accessed for 2nd unit of blood on 08/03/24.  Sorbaview dressing intact.

## 2024-08-02 NOTE — Addendum Note (Signed)
 Addended by: TAMELA RAISIN R on: 08/02/2024 08:47 AM   Modules accepted: Orders

## 2024-08-02 NOTE — Progress Notes (Signed)
 Pt. states bilateral lower extremity edema appears worse to him compression stockings on and states he is on Lasix  once a day and took this am. [place], [place] notified and visit requested.

## 2024-08-03 ENCOUNTER — Inpatient Hospital Stay

## 2024-08-03 ENCOUNTER — Emergency Department (HOSPITAL_COMMUNITY)

## 2024-08-03 ENCOUNTER — Other Ambulatory Visit: Payer: Self-pay

## 2024-08-03 ENCOUNTER — Observation Stay (HOSPITAL_COMMUNITY)
Admission: EM | Admit: 2024-08-03 | Discharge: 2024-08-04 | Disposition: A | Discharge status: Home / Self Care | Source: Ambulatory Visit | Attending: Student | Admitting: Student

## 2024-08-03 ENCOUNTER — Inpatient Hospital Stay: Admitting: Physician Assistant

## 2024-08-03 ENCOUNTER — Encounter (HOSPITAL_COMMUNITY): Payer: Self-pay | Admitting: Emergency Medicine

## 2024-08-03 VITALS — BP 137/71 | HR 88 | Temp 98.0°F | Resp 20 | Wt 165.5 lb

## 2024-08-03 DIAGNOSIS — R161 Splenomegaly, not elsewhere classified: Secondary | ICD-10-CM | POA: Diagnosis not present

## 2024-08-03 DIAGNOSIS — D649 Anemia, unspecified: Secondary | ICD-10-CM | POA: Diagnosis not present

## 2024-08-03 DIAGNOSIS — G4733 Obstructive sleep apnea (adult) (pediatric): Secondary | ICD-10-CM | POA: Diagnosis not present

## 2024-08-03 DIAGNOSIS — R10819 Abdominal tenderness, unspecified site: Secondary | ICD-10-CM | POA: Diagnosis not present

## 2024-08-03 DIAGNOSIS — Z87891 Personal history of nicotine dependence: Secondary | ICD-10-CM | POA: Insufficient documentation

## 2024-08-03 DIAGNOSIS — Z95828 Presence of other vascular implants and grafts: Secondary | ICD-10-CM

## 2024-08-03 DIAGNOSIS — R600 Localized edema: Secondary | ICD-10-CM

## 2024-08-03 DIAGNOSIS — R109 Unspecified abdominal pain: Principal | ICD-10-CM

## 2024-08-03 DIAGNOSIS — D469 Myelodysplastic syndrome, unspecified: Secondary | ICD-10-CM

## 2024-08-03 DIAGNOSIS — E8770 Fluid overload, unspecified: Secondary | ICD-10-CM | POA: Diagnosis not present

## 2024-08-03 DIAGNOSIS — K219 Gastro-esophageal reflux disease without esophagitis: Secondary | ICD-10-CM | POA: Diagnosis present

## 2024-08-03 DIAGNOSIS — J90 Pleural effusion, not elsewhere classified: Secondary | ICD-10-CM | POA: Diagnosis not present

## 2024-08-03 DIAGNOSIS — R799 Abnormal finding of blood chemistry, unspecified: Secondary | ICD-10-CM | POA: Diagnosis present

## 2024-08-03 DIAGNOSIS — D7389 Other diseases of spleen: Secondary | ICD-10-CM

## 2024-08-03 DIAGNOSIS — R7303 Prediabetes: Secondary | ICD-10-CM | POA: Diagnosis present

## 2024-08-03 DIAGNOSIS — I5033 Acute on chronic diastolic (congestive) heart failure: Principal | ICD-10-CM | POA: Diagnosis present

## 2024-08-03 DIAGNOSIS — R17 Unspecified jaundice: Secondary | ICD-10-CM | POA: Diagnosis not present

## 2024-08-03 DIAGNOSIS — Z1589 Genetic susceptibility to other disease: Secondary | ICD-10-CM | POA: Diagnosis not present

## 2024-08-03 DIAGNOSIS — R1011 Right upper quadrant pain: Secondary | ICD-10-CM | POA: Diagnosis not present

## 2024-08-03 LAB — CMP (CANCER CENTER ONLY)
ALT: 72 U/L — ABNORMAL HIGH (ref 0–44)
AST: 22 U/L (ref 15–41)
Albumin: 3.9 g/dL (ref 3.5–5.0)
Alkaline Phosphatase: 159 U/L — ABNORMAL HIGH (ref 38–126)
Anion gap: 5 (ref 5–15)
BUN: 25 mg/dL — ABNORMAL HIGH (ref 8–23)
CO2: 28 mmol/L (ref 22–32)
Calcium: 8.8 mg/dL — ABNORMAL LOW (ref 8.9–10.3)
Chloride: 100 mmol/L (ref 98–111)
Creatinine: 1 mg/dL (ref 0.61–1.24)
GFR, Estimated: >60 mL/min (ref >60)
Glucose, Bld: 161 mg/dL — ABNORMAL HIGH (ref 70–99)
Potassium: 4.3 mmol/L (ref 3.5–5.1)
Sodium: 133 mmol/L — ABNORMAL LOW (ref 135–145)
Total Bilirubin: 2.5 mg/dL — ABNORMAL HIGH (ref 0.0–1.2)
Total Protein: 5.5 g/dL — ABNORMAL LOW (ref 6.5–8.1)

## 2024-08-03 LAB — COMPREHENSIVE METABOLIC PANEL WITH GFR
ALT: 78 U/L — ABNORMAL HIGH (ref 0–44)
AST: 24 U/L (ref 15–41)
Albumin: 3.8 g/dL (ref 3.5–5.0)
Alkaline Phosphatase: 160 U/L — ABNORMAL HIGH (ref 38–126)
Anion gap: 8 (ref 5–15)
BUN: 26 mg/dL — ABNORMAL HIGH (ref 8–23)
CO2: 25 mmol/L (ref 22–32)
Calcium: 8.2 mg/dL — ABNORMAL LOW (ref 8.9–10.3)
Chloride: 99 mmol/L (ref 98–111)
Creatinine, Ser: 0.92 mg/dL (ref 0.61–1.24)
GFR, Estimated: >60 mL/min (ref >60)
Glucose, Bld: 204 mg/dL — ABNORMAL HIGH (ref 70–99)
Potassium: 4.4 mmol/L (ref 3.5–5.1)
Sodium: 133 mmol/L — ABNORMAL LOW (ref 135–145)
Total Bilirubin: 1.9 mg/dL — ABNORMAL HIGH (ref 0.0–1.2)
Total Protein: 5 g/dL — ABNORMAL LOW (ref 6.5–8.1)

## 2024-08-03 LAB — TYPE AND SCREEN
ABO/RH(D): A POS
Antibody Screen: NEGATIVE
Unit division: 0

## 2024-08-03 LAB — CBC WITH DIFFERENTIAL/PLATELET
Abs Immature Granulocytes: 0.14 K/uL — ABNORMAL HIGH (ref 0.00–0.07)
Basophils Absolute: 0.1 K/uL (ref 0.0–0.1)
Basophils Relative: 2 %
Eosinophils Absolute: 0.3 K/uL (ref 0.0–0.5)
Eosinophils Relative: 5 %
HCT: 20.1 % — ABNORMAL LOW (ref 39.0–52.0)
Hemoglobin: 6.4 g/dL — CL (ref 13.0–17.0)
Immature Granulocytes: 2 %
Lymphocytes Relative: 13 %
Lymphs Abs: 0.8 K/uL (ref 0.7–4.0)
MCH: 32.2 pg (ref 26.0–34.0)
MCHC: 31.8 g/dL (ref 30.0–36.0)
MCV: 101 fL — ABNORMAL HIGH (ref 80.0–100.0)
Monocytes Absolute: 1.4 K/uL — ABNORMAL HIGH (ref 0.1–1.0)
Monocytes Relative: 23 %
Neutro Abs: 3.3 K/uL (ref 1.7–7.7)
Neutrophils Relative %: 55 %
Platelets: 70 K/uL — ABNORMAL LOW (ref 150–400)
RBC: 1.99 MIL/uL — ABNORMAL LOW (ref 4.22–5.81)
RDW: 24.3 % — ABNORMAL HIGH (ref 11.5–15.5)
WBC: 6 K/uL (ref 4.0–10.5)
nRBC: 0 % (ref 0.0–0.2)

## 2024-08-03 LAB — CBC WITH DIFFERENTIAL (CANCER CENTER ONLY)
Abs Immature Granulocytes: 0.22 K/uL — ABNORMAL HIGH (ref 0.00–0.07)
Basophils Absolute: 0.2 K/uL — ABNORMAL HIGH (ref 0.0–0.1)
Basophils Relative: 3 %
Eosinophils Absolute: 0.5 K/uL (ref 0.0–0.5)
Eosinophils Relative: 7 %
HCT: 20.9 % — ABNORMAL LOW (ref 39.0–52.0)
Hemoglobin: 7 g/dL — ABNORMAL LOW (ref 13.0–17.0)
Immature Granulocytes: 3 %
Lymphocytes Relative: 11 %
Lymphs Abs: 0.8 K/uL (ref 0.7–4.0)
MCH: 32.6 pg (ref 26.0–34.0)
MCHC: 33.5 g/dL (ref 30.0–36.0)
MCV: 97.2 fL (ref 80.0–100.0)
Monocytes Absolute: 1.8 K/uL — ABNORMAL HIGH (ref 0.1–1.0)
Monocytes Relative: 23 %
Neutro Abs: 4.1 K/uL (ref 1.7–7.7)
Neutrophils Relative %: 53 %
Platelet Count: 69 K/uL — ABNORMAL LOW (ref 150–400)
RBC: 2.15 MIL/uL — ABNORMAL LOW (ref 4.22–5.81)
RDW: 24.1 % — ABNORMAL HIGH (ref 11.5–15.5)
WBC Count: 7.7 K/uL (ref 4.0–10.5)
nRBC: 0 % (ref 0.0–0.2)

## 2024-08-03 LAB — BPAM RBC
Blood Product Expiration Date: 202510292359
ISSUE DATE / TIME: 202510161000
Unit Type and Rh: 6200

## 2024-08-03 LAB — PREPARE RBC (CROSSMATCH)

## 2024-08-03 LAB — PRO BRAIN NATRIURETIC PEPTIDE: Pro Brain Natriuretic Peptide: 481 pg/mL — ABNORMAL HIGH (ref <300.0)

## 2024-08-03 LAB — FERRITIN: Ferritin: 3700 ng/mL — ABNORMAL HIGH (ref 24–336)

## 2024-08-03 LAB — CBG MONITORING, ED: Glucose-Capillary: 113 mg/dL — ABNORMAL HIGH (ref 70–99)

## 2024-08-03 MED ORDER — FUROSEMIDE 10 MG/ML IJ SOLN
20.0000 mg | Freq: Two times a day (BID) | INTRAMUSCULAR | Status: DC
  Administered 2024-08-04: 20 mg via INTRAVENOUS
  Filled 2024-08-03: qty 4

## 2024-08-03 MED ORDER — ACETAMINOPHEN 650 MG RE SUPP: 650.0000 mg | Freq: Four times a day (QID) | RECTAL | Status: DC | PRN

## 2024-08-03 MED ORDER — FUROSEMIDE 10 MG/ML IJ SOLN
20.0000 mg | Freq: Once | INTRAMUSCULAR | Status: AC
  Administered 2024-08-03: 20 mg via INTRAVENOUS
  Filled 2024-08-03: qty 4

## 2024-08-03 MED ORDER — ONDANSETRON HCL 4 MG/2ML IJ SOLN: 4.0000 mg | Freq: Four times a day (QID) | INTRAMUSCULAR | Status: DC | PRN

## 2024-08-03 MED ORDER — IOHEXOL 300 MG/ML  SOLN
100.0000 mL | Freq: Once | INTRAMUSCULAR | Status: AC | PRN
  Administered 2024-08-03: 100 mL via INTRAVENOUS

## 2024-08-03 MED ORDER — ONDANSETRON HCL 4 MG PO TABS: 4.0000 mg | ORAL_TABLET | Freq: Four times a day (QID) | ORAL | Status: DC | PRN

## 2024-08-03 MED ORDER — ACETAMINOPHEN 325 MG PO TABS
650.0000 mg | ORAL_TABLET | Freq: Four times a day (QID) | ORAL | Status: DC | PRN
  Administered 2024-08-03: 650 mg via ORAL
  Filled 2024-08-03: qty 2

## 2024-08-03 MED ORDER — DEFERASIROX 180 MG PO TABS: 360.0000 mg | ORAL_TABLET | Freq: Every day | ORAL | Status: DC

## 2024-08-03 MED ORDER — SODIUM CHLORIDE 0.9% IV SOLUTION: Freq: Once | INTRAVENOUS | Status: AC

## 2024-08-03 MED ORDER — INSULIN ASPART 100 UNIT/ML IJ SOLN
0.0000 [IU] | Freq: Three times a day (TID) | INTRAMUSCULAR | Status: DC
  Filled 2024-08-03: qty 0.09

## 2024-08-03 NOTE — ED Provider Triage Note (Signed)
 Emergency Medicine Provider Triage Evaluation Note  Fernando Lee , a 78 y.o. male  was evaluated in triage.  Pt complains of myelodysplastic syndrome, JAK2 mutation, GERD, and prior hematochezia presents the emergency department concerns of abnormal labs, and abdominal pain.  Was seen by oncology today advised, the emergency department for evaluation given intermittent episodes of right upper quadrant pain in the last month with elevating total bilirubin levels as well as jaundice.  Received 1 unit of blood yesterday and has developed pitting edema in bilateral lower extremities today.  Review of Systems  Positive: As above Negative: As above  Physical Exam  BP 100/61 (BP Location: Right Arm)   Pulse 86   Temp 98.5 F (36.9 C) (Oral)   Resp 18   Ht 5' 7 (1.702 m)   Wt 74.8 kg   SpO2 99%   BMI 25.84 kg/m  Gen:   Awake, no distress   Resp:  Normal effort  MSK:   Moves extremities without difficulty  Other:  3+ pitting edema bilateral lower extremities.  Abdominal tenderness to the right upper quadrant left upper quadrant.  Splenomegaly noticed.  Medical Decision Making  Medically screening exam initiated at 12:59 PM.  Appropriate orders placed.  Fernando Lee was informed that the remainder of the evaluation will be completed by another provider, this initial triage assessment does not replace that evaluation, and the importance of remaining in the ED until their evaluation is complete.     Suyash Amory A, PA-C 08/03/24 1301

## 2024-08-03 NOTE — H&P (Signed)
 History and Physical    Patient: Fernando Lee FMW:[id number] DOB: June 14, 1946 DOA: 08/03/2024 DOS: the patient was seen and examined on 08/03/2024 PCP: Dayna Motto, DO  Patient coming from: Home  Chief Complaint:  Chief Complaint  Patient presents with   Abnormal Labs   HPI: Fernando Lee is a 78 y.o. male with medical history significant of osteoarthritis, cervicalgia, unspecified chest pain, colon polyps, hyperlipidemia, palpitations, GERD without esophagitis, hematochezia, hyperglycemia, hyperlipidemia, drug-induced immunodeficiency, internal hemorrhoids, nephrolithiasis, mixed hyperlipidemia, ocular migraine, prediabetes, splenomegaly, JAK2 mutation, thrombocytosis, thrombocytopenia who was sent to the emergency department from the cancer center due to symptomatic anemia and volume overload.  No chest pain, palpitations, diaphoresis or PND. He has been recently having progressively worse dyspnea, orthopnea and bilateral lower extremity edema. He denied fever, chills, rhinorrhea, sore throat, wheezing or hemoptysis.  He has also been having left-sided abdominal pain, but currently no nausea, emesis, diarrhea, constipation, melena or hematochezia. No flank pain, dysuria, frequency or hematuria. No polyuria, polydipsia or polyphasia.  Lab work: CBC showed a white count of 6.0, hemoglobin 6.4 g/dL and platelets 70.  Ferritin was 3700 ng/mL.  proBNP was 481.0 pg/mL.  CMP showed a glucose of 204, BUN 26, creatinine 0.92 and corrected calcium  of 8.4 mg/dL.  The rest of the electrolytes were normal after sodium correction.  Total protein 5.0 and albumin 3.8 g/dL.  AST 24, ALT 78, alkaline phosphatase 160 and total bilirubin 1.9 mg/dL.  Dr. Ruthe discussed the case with general surgery and gastroenterology on-call.  Imaging: 2 view chest radiograph showing bilateral new small pleural effusions.  RUQ ultrasound showing cholelithiasis with a cable call evidence for cholecystitis.  There is mild  gallbladder wall thickening and pericholecystic fluid that could be due to underlying liver disease and adjacent ascites.  Consider nuclear medicine scan.  Stable hepatomegaly but with increased liver echotexture consistent with hepatic steatosis.  Dilated CBD measuring 10 mm without evidence of choledocholithiasis or intrahepatic biliary duct dilatation.  Small volume ascites.  Patent portal vein.  CT abdomen/pelvis with contrast showing marked splenomegaly.  Cholelithiasis without cholecystitis.  Portal venous hypertension.  Moderate free-flowing bilateral pleural effusions with bilateral lower lobe compressive atelectasis.  Small hiatal hernia.  Nonobstructive calculi within the right kidney, distal left ureter and bladder.   ED course: Initial vital signs were temperature 98.5 F, pulse 86, respiration 18, BP 100/61 mmHg and O2 sat 99% on room air.  The patient received 1 unit of PRBC and 20 mg of IV furosemide .  Review of Systems: As mentioned in the history of present illness. All other systems reviewed and are negative. 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   Internal hemorrhoids    Kidney stones    Male erectile dysfunction, unspecified    Mixed dyslipidemia    Mixed hyperlipidemia    Ocular migraine    PAC (premature atrial contraction)    Personal history of colonic polyps    Prediabetes    Residual hemorrhoidal skin tags    Spleen enlarged    nov 2023 hospitalized   Unspecified hemorrhoids    Past Surgical  History:  Procedure Laterality Date   BONE MARROW BIOPSY     IR IMAGING GUIDED PORT INSERTION  09/14/2022   KIDNEY STONE SURGERY     retrieval   Social History:  reports that  he has quit smoking. His smoking use included cigarettes. He has never used smokeless tobacco. He reports current alcohol use of about 1.0 standard drink of alcohol per week. No history on file for drug use.  Allergies  Allergen Reactions   Lactose Intolerance (Gi) Diarrhea    Family History  Problem Relation Age of Onset   Stroke Brother    Congestive Heart Failure Brother     Prior to Admission medications   Medication Sig Start Date End Date Taking? Authorizing Provider  acetaminophen  (TYLENOL ) 325 MG tablet Take 2 tablets (650 mg total) by mouth every 6 (six) hours as needed for mild pain (pain score 1-3) (or Fever >/= 101). 09/20/23   Regalado, Belkys A, MD  alfuzosin  (UROXATRAL ) 10 MG 24 hr tablet Take 10 mg by mouth daily with breakfast.    [provider]  B Complex Vitamins (B COMPLEX PO) Take 1 tablet by mouth daily.    [provider]  Cholecalciferol  (VITAMIN D3) 50 MCG (2000 UT) TABS Take 2,000 Units by mouth daily.    [provider]  Deferasirox  (JADENU ) 180 MG TABS Take 2 tablets (360 mg total) by mouth daily. Take on an empty stomach. 05/18/24   Onesimo Emaline Brink, MD  docusate sodium  (COLACE) 100 MG capsule Take 100 mg by mouth 2 (two) times daily.    [provider]  fish oil-omega-3 fatty acids  1000 MG capsule Take 1 g by mouth daily.    [provider]  furosemide  (LASIX ) 20 MG tablet Take 1 tablet by mouth once daily 07/17/24   Kale, Gautam Kishore, MD  GLUCOSAMINE CHONDROITIN MSM PO Take 1 tablet by mouth daily.    [provider]  Multiple Vitamin (MULTIVITAMIN) tablet Take 1 tablet by mouth daily with breakfast.    [provider]  pacritinib  citrate (VONJO ) 100 MG capsule Take 2 capsules (200 mg total) by mouth 2 (two) times daily. 07/30/24   Onesimo Emaline Brink, MD  potassium chloride  SA (KLOR-CON  M) 20 MEQ tablet Take 1 tablet by mouth twice daily 07/24/24   Kale, Gautam Kishore, MD  Turmeric (QC  TUMERIC COMPLEX PO) Take 750 mg by mouth daily.    [provider]  valACYclovir  (VALTREX ) 500 MG tablet Take 1 tablet (500 mg total) by mouth 2 (two) times daily. 04/18/24   Onesimo Emaline Brink, MD  vitamin C  (ASCORBIC ACID ) 250 MG tablet Take 500 mg by mouth daily.    [provider]    Physical Exam: Vitals:   08/03/24 1242 08/03/24 1600 08/03/24 1642  BP: 100/61 106/64 100/67  Pulse: 86 71 71  Resp: 18 19 17   Temp: 98.5 F (36.9 C)  98 F (36.7 C)  TempSrc: Oral  Oral  SpO2: 99% 97% 96%  Weight: 74.8 kg    Height: 5' 7 (1.702 m)     Physical Exam Vitals reviewed.  Constitutional:      General: He is awake. He is not in acute distress.    Appearance: He is ill-appearing.  HENT:     Head: Normocephalic.     Nose: No rhinorrhea.     Mouth/Throat:     Mouth: Mucous membranes are moist.  Eyes:     General: No scleral icterus.  Pupils: Pupils are equal, round, and reactive to light.  Neck:     Vascular: No JVD.  Cardiovascular:     Rate and Rhythm: Normal rate and regular rhythm.     Heart sounds: S1 normal and S2 normal.  Pulmonary:     Effort: Pulmonary effort is normal.     Breath sounds: Normal breath sounds. No wheezing, rhonchi or rales.  Abdominal:     General: Bowel sounds are normal. There is no distension.     Palpations: Abdomen is soft.     Tenderness: There is no abdominal tenderness. There is no right CVA tenderness or left CVA tenderness.  Musculoskeletal:     Cervical back: Neck supple.     Right lower leg: 3+ Pitting Edema present.     Left lower leg: 3+ Pitting Edema present.  Skin:    General: Skin is warm and dry.  Neurological:     General: No focal deficit present.     Mental Status: He is alert and oriented to person, place, and time.  Psychiatric:        Mood and Affect: Mood normal.        Behavior: Behavior normal. Behavior is cooperative.     Data Reviewed:  Results are pending, will review when  available. 11/15/2023 transthoracic echocardiogram. IMPRESSIONS:    1. Left ventricular ejection fraction, by estimation, is 60 to 65%. The  left ventricle has normal function. The left ventricle has no regional  wall motion abnormalities. There is mild left ventricular hypertrophy.  Left ventricular diastolic parameters  are consistent with Grade II diastolic dysfunction (pseudonormalization).   2. Right ventricular systolic function is normal. The right ventricular  size is normal.   3. Left atrial size was moderately dilated.   4. The mitral valve is normal in structure. Mild mitral valve  regurgitation. No evidence of mitral stenosis.   5. The aortic valve is normal in structure. Aortic valve regurgitation is  mild. No aortic stenosis is present.   6. The inferior vena cava is normal in size with greater than 50%  respiratory variability, suggesting right atrial pressure of 3 mmHg.   Comparison(s): Echocardiogram done 03/23/21 showed an EF of 60-65%.   EKG: Vent. 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 Supplemental oxygen as needed. Sodium and fluid restriction. Continue furosemide  40 mg IVP twice daily. Monitor daily weights, intake and output. Monitor renal function electrolytes.  Active Problems:   Symptomatic anemia In the setting of:   MDS (myelodysplastic syndrome) (HCC) Transfused 1 unit of PRBC. Follow-up CBC in the morning. Follow-up with oncology as scheduled.    Splenomegaly If oncology indicates it. -General Surgery available for splenectomy.    Iron  overload  Continue deferasirox  360 mg daily.    Gastroesophageal reflux disease without esophagitis Antiacid, H2 blocker or PPI as needed.    Prediabetes Carbohydrate modified diet. CBG  monitoring with RI SS. Check hemoglobin A1c.    Obstructive sleep apnea Currently not on CPAP.    Hyperbilirubinemia Seems to be improving. Case discussed with GI. -No further recommendations at this time. Will add on LDH to initial labs. Will follow-up LFTs in AM.    Advance Care Planning:   Code Status: Full Code   Consults:   Family Communication:   Severity of  Illness: The appropriate patient status for this patient is INPATIENT. Inpatient status is judged to be reasonable and necessary in order to provide the required intensity of service to ensure the patient's safety. The patient's presenting symptoms, physical exam findings, and initial radiographic and laboratory data in the context of their chronic comorbidities is felt to place them at high risk for further clinical deterioration. Furthermore, it is not anticipated that the patient will be medically stable for discharge from the hospital within 2 midnights of admission.   * I certify that at the point of admission it is my clinical judgment that the patient will require inpatient hospital care spanning beyond 2 midnights from the point of admission due to high intensity of service, high risk for further deterioration and high frequency of surveillance required.*  Author: Alm Dorn Castor, MD 08/03/2024 5:17 PM  For on call review [email or web address].   This document was prepared using Dragon voice recognition software and may contain some unintended transcription errors.

## 2024-08-03 NOTE — Progress Notes (Signed)
 Symptom Management Consult Note [facility]    Patient Care Team: Dayna Motto, DO as PCP - General (Family Medicine)    Name / MRN / DOB: Fernando Lee  [id number]  1945-12-07   Date of visit: 08/03/2024   Chief Complaint/Reason for visit: leg swelling    ASSESSMENT AND PLAN Patient is a 78 y.o. male with oncologic history of JAK2 positive myeloproliferative neoplasm/MDS followed by Dr. Onesimo and co managed with Dr. Reyna at [facility].  I have viewed most recent oncology note and lab work.  #JAK2 positive myeloproliferative neoplasm/MDS  - Currently taking Vonjo  200 mg po BID and iron  chelation with jadenu . - Reviewed patient's labs and he has worsening iron  overload. Ferritin last month was 5,454 and has been significantly elevated over the last 8 months. Depending on ED work up patient might benefit from GOC conversations.   #Anemia - Transfusion dependent. Patient transfused 1 unit yesterday as Hgb was 6. He prefers one unit per day because of discomfort from splenomegaly and bloating after receiving 2 units.  - On exam today patient appears volume overloaded, edema extending to thighs which is new. Weight is up 5 pounds. Albumin is normal today. - Will defer transfusion to EDP as he is being transferred there. Per discussion with Dr. Onesimo patient can proceed with transfusion today if fluid overload is addressed alongside.   #Abnormal labs - Worsening thrombocytopenia. 69k from baseline around 100k over the last month. No active bleeding although reports easy bruising - Elevated t bili 2.5. Last checked x 2 months ago and was normal. Patient has acute intermittent RUQ pain x 1 month, worsening abdominal distention, and splenomegaly. Patient has jaundice today as well. - After reviewing with Dr. Onesimo he agrees with plan for urgent ED evaluation of symptoms to include scans and likely admission for diuresis. - Report given to accepting ED RN.  HEME/ONC HISTORY Oncology  History  MDS (myelodysplastic syndrome) (HCC)  05/26/2022 Initial Diagnosis   MDS (myelodysplastic syndrome) (HCC)   05/31/2022 - 08/11/2022 Chemotherapy   Patient is on Treatment Plan : MYELODYSPLASIA Luspatercept  q21d     09/06/2022 - 02/25/2023 Chemotherapy   Patient is on Treatment Plan : MYELODYSPLASIA  Azacitidine  IV D1-5 q28d         INTERVAL HISTORY  Discussed the use of AI scribe software for clinical note transcription with the patient, who gave verbal consent to proceed.    Fernando Lee is a 78 y.o. male with oncologic history as above presenting to [facility] today with chief complaint of leg swelling. Accompanied to clinic today by spouse who provides additional history.  He has experienced significant fluid buildup in his legs, extending into his thighs over the past week. Daily swelling in his lower legs and ankles subsides overnight but returns during the day, making walking difficult and impacting his ability to exercise. He takes Lasix  20 mg every morning, which increases urinary frequency, but the swelling persists. He follows a low-sodium diet but had a dietary lapse over the weekend with high-sodium foods although not enough to contribute to this degree of swelling he believes.   His splenomegaly has been worsening over the past month, causing abdominal discomfort described as 'very, very painful.' He was hospitalized for this condition a couple of years ago. At an OSH, he received two units of blood at a faster rate than usual which he believes worsened his splenomegaly, His symptoms have not improved.SABRA He experiences sharp, transient right upper  quadrant pain, which is not associated with eating.  He reports a dry, raspy cough that he attributes to air fresheners, which started after a trip to the beach in late September to early October. He has removed air fresheners from his home to mitigate this issue however spouse notices he still is coughing daily. He experiences shortness  of breath when climbing stairs, which has not worsened. He also reports easy bruising and a grayish or bronzy discoloration of his skin, particularly noted in his legs.  He has been taking deferasirox  (Jadenu ) since August 1st. He started with one tablet and increased to two tablets daily since September 22nd.        ROS  All other systems are reviewed and are negative for acute change except as noted in the HPI.    Allergies  Allergen Reactions   Lactose Intolerance (Gi) Diarrhea     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   Internal hemorrhoids    Kidney stones    Male erectile dysfunction, unspecified    Mixed dyslipidemia    Mixed hyperlipidemia    Ocular migraine    PAC (premature atrial contraction)    Personal history of colonic polyps    Prediabetes    Residual hemorrhoidal skin tags    Spleen enlarged    nov 2023 hospitalized   Unspecified hemorrhoids      Past Surgical History:  Procedure Laterality Date   BONE MARROW BIOPSY     IR IMAGING GUIDED PORT INSERTION  09/14/2022   KIDNEY STONE SURGERY     retrieval    Social History   Socioeconomic History   Marital status: Married    Spouse name: Not on file   Number of children: Not on file   Years of education: Not on file   Highest education level: Not on file  Occupational History   Not on file  Tobacco Use   Smoking status: Former    Types: Cigarettes   Smokeless tobacco: Never  Substance and Sexual Activity   Alcohol use: Yes    Alcohol/week: 1.0 standard drink of alcohol    Types: 1 Cans of beer per week   Drug use: Not on file   Sexual activity: Not on file  Other Topics Concern   Not on file   Social History Narrative   Not on file   Social Drivers of Health   Financial Resource Strain: Not on file  Food Insecurity: Patient Declined (09/16/2023)   Hunger Vital Sign    Worried About Running Out of Food in the Last Year: Patient declined    Ran Out of Food in the Last Year: Patient declined  Transportation Needs: Patient Declined (09/16/2023)   PRAPARE - Administrator, Civil Service (Medical): Patient declined    Lack of Transportation (Non-Medical): Patient declined  Physical Activity: Not on file  Stress: Not on file  Social Connections: Not on file  Intimate Partner Violence: Patient Declined (09/16/2023)   Humiliation, Afraid, Rape, and Kick questionnaire    Fear of Current or Ex-Partner: Patient declined    Emotionally  Abused: Patient declined    Physically Abused: Patient declined    Sexually Abused: Patient declined    Family History  Problem Relation Age of Onset   Stroke Brother    Congestive Heart Failure Brother      Current Outpatient Medications:    acetaminophen  (TYLENOL ) 325 MG tablet, Take 2 tablets (650 mg total) by mouth every 6 (six) hours as needed for mild pain (pain score 1-3) (or Fever >/= 101)., Disp: 30 tablet, Rfl: 0   alfuzosin  (UROXATRAL ) 10 MG 24 hr tablet, Take 10 mg by mouth daily with breakfast., Disp: , Rfl:    B Complex Vitamins (B COMPLEX PO), Take 1 tablet by mouth daily., Disp: , Rfl:    Cholecalciferol  (VITAMIN D3) 50 MCG (2000 UT) TABS, Take 2,000 Units by mouth daily., Disp: , Rfl:    Deferasirox  (JADENU ) 180 MG TABS, Take 2 tablets (360 mg total) by mouth daily. Take on an empty stomach., Disp: 60 tablet, Rfl: 2   docusate sodium  (COLACE) 100 MG capsule, Take 100 mg by mouth 2 (two) times daily., Disp: , Rfl:    fish oil-omega-3 fatty acids  1000 MG capsule, Take 1 g by mouth daily., Disp: , Rfl:    furosemide  (LASIX ) 20 MG tablet, Take 1 tablet by mouth once daily, Disp: 30 tablet, Rfl: 0   GLUCOSAMINE  CHONDROITIN MSM PO, Take 1 tablet by mouth daily., Disp: , Rfl:    Multiple Vitamin (MULTIVITAMIN) tablet, Take 1 tablet by mouth daily with breakfast., Disp: , Rfl:    pacritinib  citrate (VONJO ) 100 MG capsule, Take 2 capsules (200 mg total) by mouth 2 (two) times daily., Disp: 120 capsule, Rfl: 2   potassium chloride  SA (KLOR-CON  M) 20 MEQ tablet, Take 1 tablet by mouth twice daily, Disp: 30 tablet, Rfl: 0   Turmeric (QC TUMERIC COMPLEX PO), Take 750 mg by mouth daily., Disp: , Rfl:    valACYclovir  (VALTREX ) 500 MG tablet, Take 1 tablet (500 mg total) by mouth 2 (two) times daily., Disp: 60 tablet, Rfl: 5   vitamin C  (ASCORBIC ACID ) 250 MG tablet, Take 500 mg by mouth daily., Disp: , Rfl:   PHYSICAL EXAM ECOG FS:1 - Symptomatic but completely ambulatory    Vitals:   08/03/24 0903 08/03/24 0920  BP: 137/71   Pulse: 88   Resp: 16 20  Temp: 98 F (36.7 C)   TempSrc: Temporal   SpO2: 93% 94%  Weight: 165 lb 8 oz (75.1 kg)    Physical Exam Vitals and nursing note reviewed.  Constitutional:      Appearance: He is not ill-appearing or toxic-appearing.  HENT:     Head: Normocephalic.     Mouth/Throat:     Comments: Gums are pale Eyes:     General: Scleral icterus present.     Conjunctiva/sclera: Conjunctivae normal.  Cardiovascular:     Rate and Rhythm: Normal rate and regular rhythm.     Pulses: Normal pulses.     Heart sounds: Normal heart sounds.     Comments: Edema extends to upper thighs Pulmonary:     Effort: Pulmonary effort is normal.     Breath sounds: Normal breath sounds.  Abdominal:     General: There is no distension.  Musculoskeletal:     Cervical back: Normal range of motion.     Right lower leg: 1+ Pitting Edema present.     Left lower leg: 2+ Pitting Edema present.  Skin:    General: Skin is warm and  dry.     Coloration: Skin is jaundiced.     Findings: Bruising (right forearm) present.  Neurological:     Mental Status: He is alert.         LABORATORY DATA I have reviewed the data as listed    Latest Ref Rng & Units 08/03/2024    8:58 AM 08/02/2024    7:51 AM 07/26/2024    7:47 AM  CBC  WBC 4.0 - 10.5 K/uL 7.7  6.9  5.4   Hemoglobin 13.0 - 17.0 g/dL 7.0  6.0  7.0   Hematocrit 39.0 - 52.0 % 20.9  18.1  21.0   Platelets 150 - 400 K/uL 69  69  105         Latest Ref Rng & Units 08/03/2024    8:58 AM 05/24/2024    7:47 AM 04/05/2024    8:39 AM  CMP  Glucose 70 - 99 mg/dL 838  754  792   BUN 8 - 23 mg/dL 25  22  19    Creatinine 0.61 - 1.24 mg/dL 8.99  8.84  9.02   Sodium 135 - 145 mmol/L 133  136  137   Potassium 3.5 - 5.1 mmol/L 4.3  4.1  4.3   Chloride 98 - 111 mmol/L 100  105  103   CO2 22 - 32 mmol/L 28  24  28    Calcium  8.9 - 10.3 mg/dL 8.8  8.7  8.6   Total Protein 6.5 - 8.1 g/dL 5.5  6.2  5.9   Total Bilirubin 0.0 - 1.2 mg/dL 2.5  1.0  1.2   Alkaline Phos 38 - 126 U/L 159  63  51   AST 15 - 41 U/L 22  23  16    ALT 0 - 44 U/L 72  59  34        RADIOGRAPHIC STUDIES (from last 24 hours if applicable) I have personally reviewed the radiological images as listed and agreed with the findings in the report. No results found.      Visit Diagnosis: 1. MDS (myelodysplastic syndrome) (HCC)   2. JAK2 gene mutation   3. Port-A-Cath in place   4. Anemia, unspecified type   5. Lower extremity edema   6. Total bilirubin, elevated      No orders of the defined types were placed in this encounter.   All questions were answered. The patient knows to call the clinic with any problems, questions or concerns. No barriers to learning was detected.  A total of more than 40 minutes were spent on this encounter with face-to-face time and non-face-to-face time, including preparing to see the patient, ordering tests and/or medications, counseling the patient and coordination of care as outlined above.    Thank you for allowing me to participate in the care of this patient.    Alencia Gordon E  Walisiewicz,  PA-C Department of Hematology/Oncology [facility] at [facility] Phone: [phone]  [phone]    08/03/2024 12:10 PM

## 2024-08-03 NOTE — ED Provider Notes (Addendum)
 [place] EMERGENCY DEPARTMENT AT [facility] Provider Note   CSN: [id number] Arrival date & time: 08/03/24  1236     Patient presents with: Abnormal Labs   Fernando Lee is a 78 y.o. male.   Patient here with abdominal pain and swelling.  Sent here by his oncologist.  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  He is on medicine for this.  He got a blood transfusion yesterday.  He gets blood transfusions somewhat weekly for this.  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  Bilirubin is little bit more elevated than normal and may be some jaundice.  He is now developing some swelling in his lower legs.  Sent here for evaluation by his oncology team.  He is having mostly left upper abdominal discomfort at times and distention.  He denies any trauma or falls.  Denies any fever or chills.  Denies any weakness numbness tingling.  Denies any alcohol use.  Denies any black or bloody stools.  Denies any shortness of breath chest pain.  The history is provided by the patient.       Prior to Admission medications   Medication Sig Start Date End Date Taking? Authorizing Provider  acetaminophen  (TYLENOL ) 325 MG tablet Take 2 tablets (650 mg total) by mouth every 6 (six) hours as needed for mild pain (pain score 1-3) (or Fever >/= 101). 09/20/23   Regalado, Belkys A, MD  alfuzosin  (UROXATRAL ) 10 MG 24 hr tablet Take 10 mg by mouth daily with breakfast.    [provider]  B Complex Vitamins (B COMPLEX PO) Take 1 tablet by mouth daily.    [provider]  Cholecalciferol  (VITAMIN D3) 50 MCG (2000 UT) TABS Take 2,000 Units by mouth daily.    [provider]  Deferasirox  (JADENU ) 180 MG TABS Take 2 tablets (360 mg total) by mouth daily. Take on an empty stomach. 05/18/24   Onesimo Emaline Brink, MD  docusate sodium  (COLACE) 100 MG capsule Take 100 mg by mouth 2 (two) times daily.    [provider]  fish oil-omega-3 fatty acids  1000 MG capsule Take 1  g by mouth daily.    [provider]  furosemide  (LASIX ) 20 MG tablet Take 1 tablet by mouth once daily 07/17/24   Kale, Gautam Kishore, MD  GLUCOSAMINE CHONDROITIN MSM PO Take 1 tablet by mouth daily.    [provider]  Multiple Vitamin (MULTIVITAMIN) tablet Take 1 tablet by mouth daily with breakfast.    [provider]  pacritinib  citrate (VONJO ) 100 MG capsule Take 2 capsules (200 mg total) by mouth 2 (two) times daily. 07/30/24   Kale, Gautam Kishore, MD  potassium chloride  SA (KLOR-CON  M) 20 MEQ tablet Take 1 tablet by mouth twice daily 07/24/24   Kale, Gautam Kishore, MD  Turmeric (QC TUMERIC COMPLEX PO) Take 750 mg by mouth daily.    [provider]  valACYclovir  (VALTREX ) 500 MG tablet Take 1 tablet (500 mg total) by mouth 2 (two) times daily. 04/18/24   Onesimo Emaline Brink, MD  vitamin C  (ASCORBIC ACID ) 250 MG tablet Take 500 mg by mouth daily.    [provider]    Allergies: Lactose intolerance (gi)    Review of Systems  Updated Vital Signs BP 100/67 (BP Location: Right Arm)   Pulse 71   Temp 98 F (36.7 C) (Oral)   Resp 17   Ht 5' 7 (1.702 m)   Wt 74.8 kg   SpO2  96%   BMI 25.84 kg/m   Physical Exam Vitals and nursing note reviewed.  Constitutional:      General: He is not in acute distress.    Appearance: He is well-developed. He is not ill-appearing.  HENT:     Head: Normocephalic and atraumatic.     Nose: Nose normal.     Mouth/Throat:     Mouth: Mucous membranes are moist.  Eyes:     Conjunctiva/sclera: Conjunctivae normal.     Pupils: Pupils are equal, round, and reactive to light.  Cardiovascular:     Rate and Rhythm: Normal rate and regular rhythm.     Heart sounds: No murmur heard. Pulmonary:     Effort: Pulmonary effort is normal. No respiratory distress.     Breath sounds: Normal breath sounds.  Abdominal:     General: There is distension.     Palpations: Abdomen is soft.     Tenderness: There is  abdominal tenderness.  Musculoskeletal:        General: No swelling.     Cervical back: Normal range of motion and neck supple.     Right lower leg: Edema present.     Left lower leg: Edema present.  Skin:    General: Skin is warm and dry.     Capillary Refill: Capillary refill takes less than 2 seconds.  Neurological:     General: No focal deficit present.     Mental Status: He is alert. He is disoriented.     Cranial Nerves: No cranial nerve deficit.     Motor: No weakness.  Psychiatric:        Mood and Affect: Mood normal.     (all labs ordered are listed, but only abnormal results are displayed) Labs Reviewed  CBC WITH DIFFERENTIAL/PLATELET - Abnormal; Notable for the following components:      Result Value   RBC 1.99 (*)    Hemoglobin 6.4 (*)    HCT 20.1 (*)    MCV 101.0 (*)    RDW 24.3 (*)    Platelets 70 (*)    Monocytes Absolute 1.4 (*)    Abs Immature Granulocytes 0.14 (*)    All other components within normal limits  COMPREHENSIVE METABOLIC PANEL WITH GFR - Abnormal; Notable for the following components:   Sodium 133 (*)    Glucose, Bld 204 (*)    BUN 26 (*)    Calcium  8.2 (*)    Total Protein 5.0 (*)    ALT 78 (*)    Alkaline Phosphatase 160 (*)    Total Bilirubin 1.9 (*)    All other components within normal limits  FERRITIN - Abnormal; Notable for the following components:   Ferritin 3,700 (*)    All other components within normal limits  PRO BRAIN NATRIURETIC PEPTIDE - Abnormal; Notable for the following components:   Pro Brain Natriuretic Peptide 481.0 (*)    All other components within normal limits  TYPE AND SCREEN  PREPARE RBC (CROSSMATCH)    EKG: EKG Interpretation Date/Time:  Friday August 03 2024 13:11:43 EDT Ventricular Rate:  87 PR Interval:  124 QRS Duration:  96 QT Interval:  355 QTC Calculation: 427 R Axis:   -36  Text Interpretation: Sinus rhythm Left axis deviation Confirmed by Ruthe Cornet [phone]) on 08/03/2024 3:51:54  PM  Radiology: US  Abdomen Limited RUQ (LIVER/GB) Result Date: 08/03/2024 CLINICAL DATA:  Right upper quadrant pain EXAM: ULTRASOUND ABDOMEN LIMITED RIGHT UPPER QUADRANT COMPARISON:  10/25/2023 FINDINGS: Gallbladder: Multiple  small shadowing calculi layer dependently within the gallbladder. Borderline gallbladder wall thickening measuring 4 mm. Trace pericholecystic fluid, nonspecific given presence of ascites elsewhere in the right upper quadrant. Negative sonographic Murphy sign. Common bile duct: Diameter: 10 mm Liver: Stable increased liver echotexture most compatible with hepatic steatosis. The liver is enlarged measuring 22.1 cm in the midclavicular line. No intrahepatic biliary duct dilation or focal hepatic lesion. Portal vein is patent, with bidirectional flow noted within the portal vein on Doppler interrogation. Other: Small volume ascites within the right upper quadrant. IMPRESSION: 1. Cholelithiasis, with equivocal evidence of cholecystitis. Mild gallbladder wall thickening and pericholecystic fluid could be due to known history of underlying liver disease and adjacent ascites. If further interrogation is desired, nuclear medicine hepatobiliary scan could be considered. 2. Stable hepatomegaly and increased liver echotexture, consistent with hepatic steatosis. 3. Dilated common bile duct measuring 10 mm. No evidence of choledocholithiasis or intrahepatic biliary duct dilation. 4. Small volume ascites. 5. Patent portal vein, with interval development of bidirectional flow on Doppler interrogation. Electronically Signed   By: Ozell Daring M.D.   On: 08/03/2024 15:03   DG Chest 2 View Result Date: 08/03/2024 CLINICAL DATA:  Fluid overload. EXAM: CHEST - 2 VIEW COMPARISON:  12/17/2019. FINDINGS: There are bilateral new small pleural effusions. Bilateral lung fields are otherwise clear. No frank pulmonary edema. No pneumothorax on either side. Stable cardio-mediastinal silhouette. No acute osseous  abnormalities. Right-sided CT Port-A-Cath is seen with its tip overlying the cavoatrial junction region. The soft tissues are within normal limits. IMPRESSION: Bilateral new small pleural effusions. Electronically Signed   By: Ree Molt M.D.   On: 08/03/2024 13:44     .Critical Care  Performed by: Ruthe Cornet, DO Authorized by: Ruthe Cornet, DO   Critical care provider statement:    Critical care time (minutes):  35   Critical care was necessary to treat or prevent imminent or life-threatening deterioration of the following conditions: hemoglobin less than 7.   Critical care was time spent personally by me on the following activities:  Blood draw for specimens, development of treatment plan with patient or surrogate, discussions with consultants, discussions with primary provider, evaluation of patient's response to treatment, examination of patient, obtaining history from patient or surrogate, ordering and performing treatments and interventions, ordering and review of laboratory studies, ordering and review of radiographic studies, pulse oximetry, re-evaluation of patient's condition and review of old charts   Care discussed with: admitting provider      Medications Ordered in the ED  0.9 %  sodium chloride  infusion (Manually program via Guardrails IV Fluids) (has no administration in time range)  furosemide  (LASIX ) injection 20 mg (has no administration in time range)  iohexol  (OMNIPAQUE ) 300 MG/ML solution 100 mL (100 mLs Intravenous Contrast Given 08/03/24 1620)                                    Medical Decision Making Amount and/or Complexity of Data Reviewed Labs: ordered.  Risk Prescription drug management.   Fernando Lee Mussel is here with abdominal pain and swelling.  Elevated bilirubin.  He came from oncology center today.  Had a hemoglobin of 6 yesterday got transfusion came back today to recheck labs.  Hemoglobin was 7.  But bilirubin mildly elevated at 2.5.  Maybe  some jaundice on exam.  Some swelling to the lower legs.  He denies any chest pain or  shortness of breath.  He has a history of myelodysplastic syndrome.  He is on immunologic's for this.  He has a history of enlarged spleen as well.  Looks like his spleen may be larger than normal.  He is a little bit more swollen in the abdomen.  He is already had an ultrasound done of the gallbladder that shows gallstones but no obvious acute cholecystitis.  Does have small amount of ascites which might be affecting the gallbladder.  Overall he does not have fever or white count.  His hemoglobin came back at 6.4.  Will give him another unit of packed red blood cells.  I do suspect that congestion likely causing the swelling in his legs is from his belly and spleen.  Chest x-ray does not show any signs of volume overload.  BNP is unremarkable.  Does not have any shortness of breath or chest pain.  I have consulted general surgery given abnormality of his gallbladder.  They will follow-up after his CT scan is done to see if we can see any other intra-abdominal process.  Have made his oncology team aware.  Overall I do anticipate admission for blood transfusion and diuresis and further discussion about his intra-abdominal process.  He does not have any major ascites on ultrasound.  Not sure if he benefit from paracentesis if CT scan abdomen pelvis shows larger amount of ascites.  However I do suspect this is coming from his spleen.  I have clinically lower suspicion for spleen rupture given his hemodynamics are stable.  Blood pressure 100 systolic.  Heart rate in the 70s.  Overall I reviewed interpreted labs and imaging thus far.  Patient pending CT scan of his abdomen and pelvis and then anticipate admission.  Will type and transfuse 1 unit of blood.  I have no concern for GI bleed.  Denies any black or bloody stools.  Overall I had Dr. Polly review CT scan of the abdomen pelvis.  Does not seem like there is any acute process.   Ongoing splenomegaly.  Will admit to hospitalist for further care including transfusion and diuresis.  Surgery is happy to see if oncology thinks that splenectomy might be an option/desired.  Otherwise we will continue supportive treatment and admission to hospitalist for further care.  I have also reached out to Dr. Saintclair with gastroenterology.  No further workup at this time as well regarding the gallbladder.  Can continue to monitor his blood work.  This chart was dictated using voice recognition software.  Despite best efforts to proofread,  errors can occur which can change the documentation meaning.      Final diagnoses:  Abdominal pain, unspecified abdominal location  Symptomatic anemia  Splenomegaly  Peripheral edema    ED Discharge Orders     Fernando Lee          Ruthe Cornet, DO 08/03/24 1559    Ruthe Cornet, DO 08/03/24 1644    Qusay Villada, DO 08/03/24 1702

## 2024-08-03 NOTE — ED Triage Notes (Signed)
 From cancer center.  Hx of MDS.  Elevated bilirubin with jaundice and some distention. Reportedly anemic and volume overloaded with new edema to the thighs.  Given 1 unit PRBC yesterday.  Hgb today is 7.  No transfusion today.  Per Dr. Onesimo pt needs CT scan and possible transfusion once volume overload is addressed.  Pt has power port access. Had labs today at cancer center.

## 2024-08-04 ENCOUNTER — Other Ambulatory Visit (HOSPITAL_COMMUNITY): Payer: Self-pay

## 2024-08-04 ENCOUNTER — Other Ambulatory Visit: Payer: Self-pay | Admitting: Hematology

## 2024-08-04 DIAGNOSIS — I5033 Acute on chronic diastolic (congestive) heart failure: Secondary | ICD-10-CM | POA: Diagnosis not present

## 2024-08-04 DIAGNOSIS — R161 Splenomegaly, not elsewhere classified: Secondary | ICD-10-CM

## 2024-08-04 DIAGNOSIS — D469 Myelodysplastic syndrome, unspecified: Secondary | ICD-10-CM | POA: Diagnosis not present

## 2024-08-04 DIAGNOSIS — D7389 Other diseases of spleen: Secondary | ICD-10-CM

## 2024-08-04 LAB — CBC
HCT: 21.1 % — ABNORMAL LOW (ref 39.0–52.0)
Hemoglobin: 6.7 g/dL — CL (ref 13.0–17.0)
MCH: 31.2 pg (ref 26.0–34.0)
MCHC: 31.8 g/dL (ref 30.0–36.0)
MCV: 98.1 fL (ref 80.0–100.0)
Platelets: 67 K/uL — ABNORMAL LOW (ref 150–400)
RBC: 2.15 MIL/uL — ABNORMAL LOW (ref 4.22–5.81)
RDW: 23.9 % — ABNORMAL HIGH (ref 11.5–15.5)
WBC: 7.1 K/uL (ref 4.0–10.5)
nRBC: 0.3 % — ABNORMAL HIGH (ref 0.0–0.2)

## 2024-08-04 LAB — COMPREHENSIVE METABOLIC PANEL WITH GFR
ALT: 70 U/L — ABNORMAL HIGH (ref 0–44)
AST: 22 U/L (ref 15–41)
Albumin: 3.6 g/dL (ref 3.5–5.0)
Alkaline Phosphatase: 164 U/L — ABNORMAL HIGH (ref 38–126)
Anion gap: 7 (ref 5–15)
BUN: 23 mg/dL (ref 8–23)
CO2: 26 mmol/L (ref 22–32)
Calcium: 8.1 mg/dL — ABNORMAL LOW (ref 8.9–10.3)
Chloride: 103 mmol/L (ref 98–111)
Creatinine, Ser: 0.87 mg/dL (ref 0.61–1.24)
GFR, Estimated: >60 mL/min (ref >60)
Glucose, Bld: 103 mg/dL — ABNORMAL HIGH (ref 70–99)
Potassium: 4.3 mmol/L (ref 3.5–5.1)
Sodium: 136 mmol/L (ref 135–145)
Total Bilirubin: 2.5 mg/dL — ABNORMAL HIGH (ref 0.0–1.2)
Total Protein: 4.7 g/dL — ABNORMAL LOW (ref 6.5–8.1)

## 2024-08-04 LAB — MAGNESIUM: Magnesium: 2.1 mg/dL (ref 1.7–2.4)

## 2024-08-04 MED ORDER — HEPARIN SOD (PORK) LOCK FLUSH 100 UNIT/ML IV SOLN
INTRAVENOUS | Status: AC
  Filled 2024-08-04: qty 5

## 2024-08-04 MED ORDER — HEPARIN NA (PORK) LOCK FLSH PF 10 UNIT/ML IV SOLN: 10.0000 [IU] | Freq: Once | INTRAVENOUS | Status: DC

## 2024-08-04 MED ORDER — ALFUZOSIN HCL ER 10 MG PO TB24: 10.0000 mg | ORAL_TABLET | Freq: Every day | ORAL | Status: DC

## 2024-08-04 MED ORDER — MOMELOTINIB DIHYDROCHLORIDE 100 MG PO TABS
100.0000 mg | ORAL_TABLET | Freq: Every day | ORAL | 1 refills | Status: DC
  Filled 2024-08-06: qty 30, 30d supply, fill #0
  Filled 2024-08-27: qty 30, 30d supply, fill #1

## 2024-08-04 MED ORDER — HEPARIN NA (PORK) LOCK FLSH PF 10 UNIT/ML IV SOLN
50.0000 [IU] | Freq: Once | INTRAVENOUS | Status: DC
  Filled 2024-08-04: qty 5

## 2024-08-04 MED ORDER — SODIUM CHLORIDE 0.9% IV SOLUTION: Freq: Once | INTRAVENOUS | Status: DC

## 2024-08-04 MED ORDER — FUROSEMIDE 10 MG/ML IJ SOLN: 40.0000 mg | Freq: Once | INTRAMUSCULAR | Status: DC

## 2024-08-04 MED ORDER — HEPARIN NA (PORK) LOCK FLSH PF 10 UNIT/ML IV SOLN: 30.0000 [IU] | Freq: Once | INTRAVENOUS | Status: DC

## 2024-08-04 MED ORDER — VALACYCLOVIR HCL 500 MG PO TABS
500.0000 mg | ORAL_TABLET | Freq: Two times a day (BID) | ORAL | Status: DC
  Administered 2024-08-04: 500 mg via ORAL
  Filled 2024-08-04: qty 1

## 2024-08-04 NOTE — Discharge Instructions (Addendum)
 Please take your lasix  and spironolactone as prescribed by your hematologist Dr. Onesimo.   If you develop dizziness or lightheadedness with standing. Please check your blood pressure and heart rate and call the oncall hematology team. If you feel like you are going to pass out or your blood pressure is <100 (the top number) please come to the emergency department.

## 2024-08-04 NOTE — Progress Notes (Unsigned)
 ECHO and Momelotinib ordered

## 2024-08-04 NOTE — ED Notes (Signed)
 Pt refused BGC and insulin, stating that he is not diabetic and that wont be necessary. Attending made aware via secure chat.

## 2024-08-04 NOTE — Progress Notes (Signed)
 [facility]  HEMATOLOGY/ONCOLOGY INPATIENT PROGRESS NOTE  Date of Service: 08/04/2024  Inpatient Attending: .Franchot Novel, MD   SUBJECTIVE Patient was seen in hematologic/oncologic follow-up.  His wife is at bedside during this conversation. Fernando Lee was admitted with increased abdominal distention lower extremity swelling fatigue and jaundice.  He notes that he has been feeling better after receiving another unit of blood and with diuresis and is very keen to go home.  Does not note any overt bleeding. We discussed all his lab results and imaging studies in detail and had a detailed goals of care discussion at bedside.    OBJECTIVE:  NAD  PHYSICAL EXAMINATION: . Vitals:   08/04/24 0600 08/04/24 0630 08/04/24 0811 08/04/24 1030  BP: 113/60 118/62 128/60 118/63  Pulse:   86 91  Resp: 13 (!) 21 18 19   Temp:   99.1 F (37.3 C)   TempSrc:   Oral   SpO2:   95% 98%  Weight:      Height:       Filed Weights   08/03/24 1242  Weight: 165 lb (74.8 kg)   .Body mass index is 25.84 kg/m.  GENERAL:alert, in no acute distress. SKIN: No acute rashes EYES: Mild scleral icterus OROPHARYNX: Moist mucous membranes  NECK: supple, no JVD, thyroid  normal size, non-tender, without nodularity LYMPH:  no palpable lymphadenopathy in the cervical, axillary or inguinal LUNGS: Few basal rales HEART: regular rate & rhythm,  no murmurs and no lower extremity edema ABDOMEN: abdomen soft, non-tender, normoactive bowel sounds  Musculoskeletal: 2+ pitting pedal edema PSYCH: alert & oriented x 3 with fluent speech NEURO: no focal motor/sensory deficits  MEDICAL HISTORY:  Past Medical History:  Diagnosis Date   Arthritis of right knee    Cervicalgia    Chest discomfort    normal stress echo   Chest pain    Colon polyps    Dyslipidemia    Fluttering sensation of heart    GERD without esophagitis    Hematochezia    Hyperglycemia    Hyperlipidemia    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   Internal hemorrhoids    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   Kidney stones    Male erectile dysfunction, unspecified    Mixed dyslipidemia    Mixed hyperlipidemia    Ocular migraine    PAC (premature atrial contraction)    Personal history of colonic polyps    Prediabetes    Residual hemorrhoidal skin tags    Spleen enlarged    nov 2023 hospitalized   Unspecified hemorrhoids     SURGICAL HISTORY: Past Surgical History:  Procedure Laterality Date   BONE MARROW BIOPSY     IR IMAGING GUIDED PORT INSERTION  09/14/2022   KIDNEY STONE SURGERY     retrieval    SOCIAL HISTORY: Social History   Socioeconomic History   Marital status: Married    Spouse name: Not on file   Number of children: Not on file   Years of education: Not on file   Highest education level: Not on file  Occupational History   Not on file  Tobacco Use   Smoking status: Former    Types: Cigarettes   Smokeless tobacco: Never  Substance and Sexual Activity   Alcohol use: Yes    Alcohol/week: 1.0 standard drink of alcohol    Types: 1 Cans of beer per week   Drug use: Not on file   Sexual activity: Not on file  Other Topics Concern  Not on file  Social History Narrative   Not on file   Social Drivers of Health   Financial Resource Strain: Not on file  Food Insecurity: Patient Declined (09/16/2023)   Hunger Vital Sign    Worried About Running Out of Food in the Last Year: Patient declined    Ran Out of Food in the Last Year: Patient declined  Transportation Needs: Patient Declined (09/16/2023)   PRAPARE - Administrator, Civil Service (Medical): Patient declined    Lack of Transportation (Non-Medical): Patient declined  Physical Activity: Not on file  Stress: Not on file  Social Connections: Not on file  Intimate Partner Violence: Patient Declined (09/16/2023)   Humiliation, Afraid, Rape, and Kick questionnaire    Fear of Current or Ex-Partner: Patient declined     Emotionally Abused: Patient declined    Physically Abused: Patient declined    Sexually Abused: Patient declined    FAMILY HISTORY: Family History  Problem Relation Age of Onset   Stroke Brother    Congestive Heart Failure Brother     ALLERGIES:  is allergic to lactose intolerance (gi).  MEDICATIONS:  Scheduled Meds:  alfuzosin   10 mg Oral QHS   Deferasirox   360 mg Oral Daily   furosemide   20 mg Intravenous BID   valACYclovir   500 mg Oral BID   Continuous Infusions: PRN Meds:.acetaminophen  **OR** acetaminophen , ondansetron  **OR** ondansetron  (ZOFRAN ) IV  REVIEW OF SYSTEMS:    10 Point review of Systems was done is negative except as noted above.   LABORATORY DATA:  I have reviewed the data as listed  .    Latest Ref Rng & Units 08/04/2024    4:55 AM 08/03/2024    2:44 PM 08/03/2024    8:58 AM  CBC  WBC 4.0 - 10.5 K/uL 7.1  6.0  7.7   Hemoglobin 13.0 - 17.0 g/dL 6.7  6.4  7.0   Hematocrit 39.0 - 52.0 % 21.1  20.1  20.9   Platelets 150 - 400 K/uL 67  70  69     .    Latest Ref Rng & Units 08/04/2024    4:55 AM 08/03/2024    2:44 PM 08/03/2024    8:58 AM  CMP  Glucose 70 - 99 mg/dL 896  795  838   BUN 8 - 23 mg/dL 23  26  25    Creatinine 0.61 - 1.24 mg/dL 9.12  9.07  8.99   Sodium 135 - 145 mmol/L 136  133  133   Potassium 3.5 - 5.1 mmol/L 4.3  4.4  4.3   Chloride 98 - 111 mmol/L 103  99  100   CO2 22 - 32 mmol/L 26  25  28    Calcium  8.9 - 10.3 mg/dL 8.1  8.2  8.8   Total Protein 6.5 - 8.1 g/dL 4.7  5.0  5.5   Total Bilirubin 0.0 - 1.2 mg/dL 2.5  1.9  2.5   Alkaline Phos 38 - 126 U/L 164  160  159   AST 15 - 41 U/L 22  24  22    ALT 0 - 44 U/L 70  78  72      RADIOGRAPHIC STUDIES: I have personally reviewed the radiological images as listed and agreed with the findings in the report. CT ABDOMEN PELVIS W CONTRAST Result Date: 08/03/2024 CLINICAL DATA:  Splenomegaly, right upper quadrant pain EXAM: CT ABDOMEN AND PELVIS WITH CONTRAST TECHNIQUE:  Multidetector CT imaging of the abdomen and pelvis  was performed using the standard protocol following bolus administration of intravenous contrast. RADIATION DOSE REDUCTION: This exam was performed according to the departmental dose-optimization program which includes automated exposure control, adjustment of the mA and/or kV according to patient size and/or use of iterative reconstruction technique. CONTRAST:  OMNIPAQUE  IOHEXOL  300 MG/ML  SOLN COMPARISON:  08/03/2024, 08/31/2022, 07/18/2007 FINDINGS: Lower chest: Moderate bilateral free-flowing pleural effusions with bilateral lower lobe compressive atelectasis. Central venous catheter tip at the atriocaval junction. Hepatobiliary: The liver is enlarged, without focal parenchymal abnormality. Small gallstones layer dependently within the gallbladder fundus, with no evidence of gallbladder wall thickening on this exam. No biliary duct dilation or choledocholithiasis. Pancreas: Unremarkable. No pancreatic ductal dilatation or surrounding inflammatory changes. Spleen: Marked splenomegaly, measuring 23.9 x 19.3 x 12.1 cm. No focal parenchymal splenic abnormality. Adrenals/Urinary Tract: Nonobstructing 5 mm calculus lower pole right kidney. Nonobstructing 10 mm distal left ureteral calculus just proximal to the UVJ. 9 mm nonobstructing bladder calculus. No obstructive uropathy within either kidney. Kidneys enhance normally. Left kidney is displaced inferiorly due to the enlarged spleen. The adrenals are unremarkable. Stomach/Bowel: Small hiatal hernia. No bowel obstruction or ileus. Normal appendix right lower quadrant. No bowel wall thickening or inflammatory change. Vascular/Lymphatic: The splenic vein, SMV, and portal vein are patent. Portal venous hypertension manifested by enlargement of the portal vein and splenic vein, tortuosity of the splenic vein, and recanalization of the umbilical vein. Atherosclerosis of the abdominal aorta. No pathologic adenopathy.  Reproductive: Stable enlargement of the prostate. Other: Trace abdominal and pelvic ascites, greatest in the right upper quadrant. No free intraperitoneal gas. Musculoskeletal: No acute or destructive bony lesions. Reconstructed images demonstrate no additional findings. IMPRESSION: 1. Marked splenomegaly. 2. Cholelithiasis without cholecystitis. 3. Portal venous hypertension manifested by enlarged tortuous portal and splenic veins, recanalization of the umbilical vein, and trace abdominal and pelvic ascites. 4. Moderate free-flowing bilateral pleural effusions with bilateral lower lobe compressive atelectasis. 5. Small hiatal hernia. 6. Nonobstructing calculi within the right kidney, distal left ureter, and bladder as above. Electronically Signed   By: Ozell Daring M.D.   On: 08/03/2024 17:24   US  Abdomen Limited RUQ (LIVER/GB) Result Date: 08/03/2024 CLINICAL DATA:  Right upper quadrant pain EXAM: ULTRASOUND ABDOMEN LIMITED RIGHT UPPER QUADRANT COMPARISON:  10/25/2023 FINDINGS: Gallbladder: Multiple small shadowing calculi layer dependently within the gallbladder. Borderline gallbladder wall thickening measuring 4 mm. Trace pericholecystic fluid, nonspecific given presence of ascites elsewhere in the right upper quadrant. Negative sonographic Murphy sign. Common bile duct: Diameter: 10 mm Liver: Stable increased liver echotexture most compatible with hepatic steatosis. The liver is enlarged measuring 22.1 cm in the midclavicular line. No intrahepatic biliary duct dilation or focal hepatic lesion. Portal vein is patent, with bidirectional flow noted within the portal vein on Doppler interrogation. Other: Small volume ascites within the right upper quadrant. IMPRESSION: 1. Cholelithiasis, with equivocal evidence of cholecystitis. Mild gallbladder wall thickening and pericholecystic fluid could be due to known history of underlying liver disease and adjacent ascites. If further interrogation is desired, nuclear  medicine hepatobiliary scan could be considered. 2. Stable hepatomegaly and increased liver echotexture, consistent with hepatic steatosis. 3. Dilated common bile duct measuring 10 mm. No evidence of choledocholithiasis or intrahepatic biliary duct dilation. 4. Small volume ascites. 5. Patent portal vein, with interval development of bidirectional flow on Doppler interrogation. Electronically Signed   By: Ozell Daring M.D.   On: 08/03/2024 15:03   DG Chest 2 View Result Date: 08/03/2024 CLINICAL DATA:  Fluid  overload. EXAM: CHEST - 2 VIEW COMPARISON:  12/17/2019. FINDINGS: There are bilateral new small pleural effusions. Bilateral lung fields are otherwise clear. No frank pulmonary edema. No pneumothorax on either side. Stable cardio-mediastinal silhouette. No acute osseous abnormalities. Right-sided CT Port-A-Cath is seen with its tip overlying the cavoatrial junction region. The soft tissues are within normal limits. IMPRESSION: Bilateral new small pleural effusions. Electronically Signed   By: Ree Molt M.D.   On: 08/03/2024 13:44    ASSESSMENT & PLAN:   78 y.o. 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  -Mild leukocytosis.  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 Has grade 1 out of 3 reticulin fibrosis.  Uncertain if this is primary or secondary. -LDH has remained within normal limits. 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  #2 Massive splenomegaly.  Multifactorial but additional component of splenic sequestration. Patient does have some baseline splenomegaly related to his MDS/MPN with myelofibrosis and has been on JAK2 elevators to help with this. There is now some increasing splenomegaly potentially due to disease progression but some of the acute increase alongside minimal bumps from his transfusion  fairly consistent with additional splenic sequestration. CT abdomen done is concerning for portal hypertension and possibly early liver cirrhosis which is also related to splenomegaly.  #3 abnormal liver function tests.  Likely multifactorial. Cardiac congestion from diastolic CHF.  Does have elevated proBNP levels. Could also be from transfusional iron  overload.  Though ferritin has improved from 5800 down to 3700 with iron  chelation therapy. Jadenu  because liver function abnormalities.  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  No overt evidence of bleeding.  #5 diastolic heart failure grade 2  #6 factorial fatigue  PLAN Lab results were discussed in detail with the patient. His hemoglobin from today has only bumped from 6.4-6.7 after PRBC transfusion.  No evidence of bleeding.  The relative refractoriness along with increased splenomegaly is highly concerning for splenic sequestration. Would have to readjust our transfusion goals to try to keep his hemoglobin around 6.5-7 to avoid hemodynamic instability and significant increase in splenic sequestration. Also would recommend PRBC transfusions slowly over 4 hours and if possible limit to 1 unit/day. Diuresis with Lasix  and would also recommend addition of spironolactone to optimize fluid status in terms of his diastolic heart failure as well as possible liver cirrhosis. Holding Jadenu  at this time to monitor liver functions.  Eventually will need iron  chelation therapy to continue for his elevated ferritin levels. Will switch his Vonjo  to momelotinib as outpatient to try to shrink the spleen. We discussed why a splenectomy is a very fraught decision-high risk surgery plus loss of hematopoiesis plus shifting estimated hematopoiesis to the liver causing liver failure. Will get outpatient echocardiogram He has outpatient follow-up with us  on Friday for additional transfusion needs and lab monitoring Discussed goals of care in detail.   We discussed that ongoing transfusion support might be more challenging in the context of splenic sequestration.  We discussed there is no easy solutions for management of his significant splenomegaly and that there are several factors that could contribute to his liver dysfunction. - Continue Vonjo  at the current dose till the momelotinib is available and then will switch to that when available. - Okay to discharge home from hematology standpoint.  Patient is very keen to go home today and immediately.  The total time spent in the appointment was 50 minutes*.  All of the patient's questions were answered with apparent satisfaction. The patient knows to call the clinic with any problems, questions or concerns.   Emaline Saran MD MS AAHIVMS [facility] Hematology/Oncology Physician [facility]  .*Total Encounter Time as defined by the Centers for Medicare and Medicaid Services includes, in addition to the face-to-face time of a patient visit (documented in the note above) non-face-to-face time: obtaining and reviewing outside history, ordering and reviewing medications, tests or procedures, care coordination (communications with other health care professionals or caregivers) and documentation in the medical record.   08/04/2024 11:24 AM

## 2024-08-05 ENCOUNTER — Telehealth: Payer: Self-pay

## 2024-08-05 LAB — TYPE AND SCREEN
ABO/RH(D): A POS
Antibody Screen: NEGATIVE
Unit division: 0

## 2024-08-05 LAB — BPAM RBC
Blood Product Expiration Date: 202511032359
ISSUE DATE / TIME: 202510172216
Unit Type and Rh: 6200

## 2024-08-05 NOTE — Telephone Encounter (Signed)
 Oral Oncology Pharmacist Encounter  Received new prescription for Ojjaara (momelotinib) for the treatment of JAK2 positive myeloproliferative neoplasma and associated MDS, planned duration until disease progression or unacceptable toxicity. Switching from Vonjo  to Ojjaara to shrink the spleen per MD note.   Labs from 08/04/2024 assessed, patient is child pugh score class B due to slight ascites noted in last ultrasound. No dose reduction needed at this time.  Prescription dose and frequency assessed for appropriateness. MD dose reduced prescription.   Current medication list in Epic reviewed, no significant/ relevant DDIs with momelotinib identified.   Evaluated chart and no patient barriers to medication adherence noted.   Prescription has been e-scribed to the [facility] for benefits analysis and approval.  Oral Oncology Clinic will continue to follow for insurance authorization, copayment issues, initial counseling and start date.  Khambrel Amsden, PharmD Hematology/Oncology Clinical Pharmacist [facility] Oral Chemotherapy Navigation Clinic [phone] 08/05/2024 12:29 PM

## 2024-08-06 ENCOUNTER — Other Ambulatory Visit: Payer: Self-pay

## 2024-08-06 ENCOUNTER — Telehealth: Payer: Self-pay

## 2024-08-06 ENCOUNTER — Other Ambulatory Visit: Payer: Self-pay | Admitting: Hematology

## 2024-08-06 ENCOUNTER — Inpatient Hospital Stay

## 2024-08-06 ENCOUNTER — Other Ambulatory Visit (HOSPITAL_COMMUNITY): Payer: Self-pay

## 2024-08-06 DIAGNOSIS — D469 Myelodysplastic syndrome, unspecified: Secondary | ICD-10-CM

## 2024-08-06 DIAGNOSIS — D464 Refractory anemia, unspecified: Secondary | ICD-10-CM | POA: Diagnosis not present

## 2024-08-06 LAB — CBC WITH DIFFERENTIAL (CANCER CENTER ONLY)
Abs Immature Granulocytes: 0.18 K/uL — ABNORMAL HIGH (ref 0.00–0.07)
Basophils Absolute: 0.2 K/uL — ABNORMAL HIGH (ref 0.0–0.1)
Basophils Relative: 3 %
Eosinophils Absolute: 0.6 K/uL — ABNORMAL HIGH (ref 0.0–0.5)
Eosinophils Relative: 7 %
HCT: 19.5 % — ABNORMAL LOW (ref 39.0–52.0)
Hemoglobin: 6.5 g/dL — CL (ref 13.0–17.0)
Immature Granulocytes: 2 %
Lymphocytes Relative: 12 %
Lymphs Abs: 1 K/uL (ref 0.7–4.0)
MCH: 32.5 pg (ref 26.0–34.0)
MCHC: 33.3 g/dL (ref 30.0–36.0)
MCV: 97.5 fL (ref 80.0–100.0)
Monocytes Absolute: 2.2 K/uL — ABNORMAL HIGH (ref 0.1–1.0)
Monocytes Relative: 27 %
Neutro Abs: 3.8 K/uL (ref 1.7–7.7)
Neutrophils Relative %: 49 %
Platelet Count: 54 K/uL — ABNORMAL LOW (ref 150–400)
RBC: 2 MIL/uL — ABNORMAL LOW (ref 4.22–5.81)
RDW: 22.6 % — ABNORMAL HIGH (ref 11.5–15.5)
WBC Count: 8 K/uL (ref 4.0–10.5)
nRBC: 0.3 % — ABNORMAL HIGH (ref 0.0–0.2)

## 2024-08-06 LAB — SAMPLE TO BLOOD BANK

## 2024-08-06 LAB — PREPARE RBC (CROSSMATCH)

## 2024-08-06 MED ORDER — SODIUM CHLORIDE 0.9% FLUSH
10.0000 mL | Freq: Once | INTRAVENOUS | Status: AC
  Administered 2024-08-06: 10 mL

## 2024-08-06 NOTE — Telephone Encounter (Signed)
 Oral Oncology Patient Advocate Encounter  Prior Authorization for momelotinib dihydrochloride (OJJAARA) 100 MG tablet  has been approved.    PA# [id number] Effective dates: 08/06/24 through 10/17/25  Patients co-pay is $0.00.    [name], CPhT [place]  [facility] Specialty Pharmacy Services Oncology Pharmacy Patient Advocate Specialist [name] [name] Phone: [phone]  Fax: [phone] [email or web address]

## 2024-08-06 NOTE — Telephone Encounter (Addendum)
 Oral Chemotherapy Pharmacist Encounter  I spoke with patient for overview of: Ojjaara (momelotinib) for the treatment of JAK2 positive myeloproliferative neoplasma and associated MDS, planned duration until disease progression or unacceptable toxicity.   Counseled patient on administration, dosing, side effects, monitoring, drug-food interactions, safe handling, storage, and disposal.  Patient will take Ojjaara 1 tablet (100mg ) by mouth daily without regard to food.  Ojjaara start date: 08/08/2024 Patient aware that his last dose of Vonjo  will be on 08/07/2024.   Adverse effects include but are not limited to: diarrhea, nausea/vomiting, decreased blood counts, fatigue. Patient will obtain an anti-diarrheal agent such as loperamide prior to starting therapy.   Reviewed with patient importance of keeping a medication schedule and plan for any missed doses. No barriers to medication adherence identified. Medication reconciliation performed and medication/allergy list updated.  Distress thermometer not completed during telephone call as patient has been on previous lines of therapy.   Communication and Learning Assessment Primary learner: Patient Barriers to learning: No barriers Preferred language: English Learning preferences: Listening Reading  All questions answered. Patient voiced understanding and appreciation. Medication education handout placed in mail for patient. Patient knows to call the office with questions or concerns. Oral Chemotherapy Clinic phone number provided to patient.   Kena Limon, PharmD Hematology/Oncology Clinical Pharmacist [facility] Oral Chemotherapy Navigation Clinic [phone] 08/06/2024   1:46 PM

## 2024-08-06 NOTE — Progress Notes (Signed)
 Patient counseled on Ojjaara in telephone encounter opened on 08/05/2024.   Fernando Lee, PharmD Hematology/Oncology Clinical Pharmacist [facility] Oral Chemotherapy Navigation Clinic [phone]

## 2024-08-06 NOTE — Progress Notes (Signed)
 Specialty Pharmacy Initial Fill Coordination Note  Fernando Lee is a 78 y.o. male contacted today regarding refills of specialty medication(s) Momelotinib Dihydrochloride (OJJAARA) .  Patient requested Fernando Lee at [facility] at [place]  on 08/07/24   Medication will be filled on 08/06/24.   Patient is aware of 0.00 copayment.    [name], CPhT [place]  [facility] Specialty Pharmacy Services Oncology Pharmacy Patient Advocate Specialist [name] [name] Phone: [phone]  Fax: [phone] [name]

## 2024-08-06 NOTE — Telephone Encounter (Signed)
 Oral Oncology Patient Advocate Encounter   Received notification that prior authorization for momelotinib dihydrochloride (OJJAARA) 100 MG tablet  is required.   PA submitted on 08/06/24 Key [id number] Status is pending     [name], CPhT [place]  [facility] Specialty Pharmacy Services Oncology Pharmacy Patient Advocate Specialist [name] [name] Phone: [phone]  Fax: [phone] [email or web address]

## 2024-08-07 ENCOUNTER — Inpatient Hospital Stay

## 2024-08-07 ENCOUNTER — Other Ambulatory Visit (HOSPITAL_COMMUNITY): Payer: Self-pay

## 2024-08-07 ENCOUNTER — Ambulatory Visit (HOSPITAL_COMMUNITY)
Admission: RE | Admit: 2024-08-07 | Discharge: 2024-08-07 | Disposition: A | Discharge status: Home / Self Care | Source: Ambulatory Visit | Attending: Hematology | Admitting: Hematology

## 2024-08-07 DIAGNOSIS — R000 Tachycardia, unspecified: Secondary | ICD-10-CM | POA: Insufficient documentation

## 2024-08-07 DIAGNOSIS — I5033 Acute on chronic diastolic (congestive) heart failure: Secondary | ICD-10-CM | POA: Diagnosis not present

## 2024-08-07 DIAGNOSIS — I499 Cardiac arrhythmia, unspecified: Secondary | ICD-10-CM | POA: Insufficient documentation

## 2024-08-07 DIAGNOSIS — D469 Myelodysplastic syndrome, unspecified: Secondary | ICD-10-CM

## 2024-08-07 DIAGNOSIS — D464 Refractory anemia, unspecified: Secondary | ICD-10-CM | POA: Diagnosis not present

## 2024-08-07 DIAGNOSIS — G4730 Sleep apnea, unspecified: Secondary | ICD-10-CM | POA: Insufficient documentation

## 2024-08-07 DIAGNOSIS — I080 Rheumatic disorders of both mitral and aortic valves: Secondary | ICD-10-CM | POA: Insufficient documentation

## 2024-08-07 DIAGNOSIS — E785 Hyperlipidemia, unspecified: Secondary | ICD-10-CM | POA: Diagnosis not present

## 2024-08-07 LAB — ECHOCARDIOGRAM COMPLETE
Area-P 1/2: 3.19 cm2
S' Lateral: 2.58 cm

## 2024-08-07 MED ORDER — ACETAMINOPHEN 325 MG PO TABS
650.0000 mg | ORAL_TABLET | Freq: Once | ORAL | Status: AC
  Administered 2024-08-07: 650 mg via ORAL
  Filled 2024-08-07: qty 2

## 2024-08-07 MED ORDER — METHYLPREDNISOLONE SODIUM SUCC 40 MG IJ SOLR
40.0000 mg | Freq: Once | INTRAMUSCULAR | Status: AC
  Administered 2024-08-07: 40 mg via INTRAVENOUS
  Filled 2024-08-07: qty 1

## 2024-08-07 MED ORDER — SODIUM CHLORIDE 0.9% IV SOLUTION
250.0000 mL | INTRAVENOUS | Status: DC
  Administered 2024-08-07: 250 mL via INTRAVENOUS

## 2024-08-07 NOTE — Patient Instructions (Signed)
 Blood Transfusion, Adult A blood transfusion is a procedure in which you receive blood or a type of blood cell (blood component) through an IV. You may need a blood transfusion when you have a low blood count, which is a low number of any blood cell. This may result from a bleeding disorder, illness, injury, or surgery. The blood may come from a donor, or you may be able to have your own blood collected and stored (autologous blood donation) before a planned surgery. The blood given in a transfusion may be made up of different blood components. You may receive: Red blood cells. These carry oxygen to the cells in the body. Platelets. These help your blood to clot. Plasma. This is the liquid part of your blood. It carries proteins and other substances throughout the body. White blood cells. These help you fight infections. If you have hemophilia or another clotting disorder, you may also receive other types of blood products. Depending on the type of blood product, this procedure may take 1-4 hours to complete. Tell a health care provider about: Any bleeding problems you have. Any previous reactions you have had during a blood transfusion. Any allergies you have. All medicines you are taking, including vitamins, herbs, eye drops, creams, and over-the-counter medicines. Any surgeries you have had. Any medical conditions you have. Whether you are pregnant or may be pregnant. What are the risks? Talk with your health care provider about risks. The most common problems include: A mild allergic reaction, such as red, swollen areas of skin (hives) and itching. Fever or chills. This may be the body's response to new blood cells received. This may occur during or up to 4 hours after the transfusion. More serious problems may include: A serious allergic reaction that causes difficulty breathing or swelling around the face and lips. Transfusion-associated circulatory overload (TACO), or too much fluid in  the lungs. This may cause breathing problems. Transfusion-related acute lung injury (TRALI), which causes breathing difficulty and low oxygen in the blood. This can occur within hours of the transfusion or several days later. Iron overload. This can happen after receiving many blood transfusions over a period of time. Infection or virus being transmitted. This is rare because donated blood is carefully tested before it is given. Hemolytic transfusion reaction. This is rare. It happens when the body's defense system (immune system)tries to attack the new blood cells. Symptoms may include fever, chills, nausea, low blood pressure, and low back or chest pain. Transfusion-associated graft-versus-host disease (TAGVHD). This is rare. It happens when donated cells attack the body's healthy tissues. What happens before the procedure? You will have a blood test to check your blood type. This test is done to know what kind of blood your body will accept and to match it to the donor blood. If you are going to have a planned surgery, you may be able to do an autologous blood donation. This may be done in case you need to have a transfusion. You will have your temperature, blood pressure, and pulse checked before the transfusion. If you have had an allergic reaction to a transfusion in the past, you may be given medicine to help prevent a reaction. This medicine may be given to you by mouth (orally) or through an IV. What happens during the procedure?  An IV will be inserted into one of your veins. The bag of blood will be attached to your IV. The blood will then enter through your vein. Your temperature, blood pressure,  and pulse will be monitored during the transfusion. This monitoring is done to detect early signs of a transfusion reaction. Tell your nurse right away if you have any of these symptoms during the transfusion: Shortness of breath or trouble breathing. Chest or back pain. Fever or  chills. Itching or hives. If you have any signs or symptoms of a reaction, your transfusion will be stopped and you may be given medicine. When the transfusion is complete, your IV will be removed. Pressure may be applied to the IV site for a few minutes. A bandage (dressing)will be applied. The procedure may vary among health care providers and hospitals. What happens after the procedure? Your temperature, blood pressure, pulse, breathing rate, and blood oxygen level will be monitored until you leave the hospital or clinic. Your blood may be tested to see how you have responded to the transfusion. You may be warmed with fluids or blankets to maintain a normal body temperature. If you receive your blood transfusion in an outpatient setting, you will be told whom to contact to report any reactions. Where to find more information Visit the [employer]: redcross.org Summary A blood transfusion is a procedure in which you receive blood or a type of blood cell (blood component) through an IV. The blood given in a transfusion may be made up of different blood components. You may receive red blood cells, platelets, plasma, or white blood cells depending on the condition treated. Your temperature, blood pressure, and pulse will be monitored before, during, and after the transfusion. After the transfusion, your blood may be tested to see how your body has responded. This information is not intended to replace advice given to you by your health care provider. Make sure you discuss any questions you have with your health care provider. Document Revised: 01/01/2022 Document Reviewed: 01/01/2022 Elsevier Patient Education  2024 [employer].

## 2024-08-08 ENCOUNTER — Other Ambulatory Visit: Payer: Self-pay

## 2024-08-08 LAB — BPAM RBC
Blood Product Expiration Date: 202511062359
ISSUE DATE / TIME: 202510211225
Unit Type and Rh: 202511062359
Unit Type and Rh: 6200

## 2024-08-08 LAB — TYPE AND SCREEN
ABO/RH(D): A POS
Antibody Screen: NEGATIVE
Unit division: 0

## 2024-08-10 ENCOUNTER — Other Ambulatory Visit: Payer: Self-pay

## 2024-08-10 ENCOUNTER — Inpatient Hospital Stay

## 2024-08-10 ENCOUNTER — Inpatient Hospital Stay (HOSPITAL_BASED_OUTPATIENT_CLINIC_OR_DEPARTMENT_OTHER): Admitting: Hematology

## 2024-08-10 ENCOUNTER — Encounter (HOSPITAL_COMMUNITY): Payer: Self-pay | Admitting: Internal Medicine

## 2024-08-10 ENCOUNTER — Inpatient Hospital Stay (HOSPITAL_COMMUNITY)
Admission: EM | Admit: 2024-08-10 | Discharge: 2024-08-13 | DRG: 812 | Disposition: A | Discharge status: Hospice | Attending: Internal Medicine | Admitting: Internal Medicine

## 2024-08-10 ENCOUNTER — Emergency Department (HOSPITAL_COMMUNITY)

## 2024-08-10 VITALS — BP 97/55 | HR 94 | Temp 96.8°F | Resp 17 | Ht 67.0 in | Wt 162.0 lb

## 2024-08-10 DIAGNOSIS — R162 Hepatomegaly with splenomegaly, not elsewhere classified: Secondary | ICD-10-CM | POA: Diagnosis present

## 2024-08-10 DIAGNOSIS — D464 Refractory anemia, unspecified: Principal | ICD-10-CM | POA: Diagnosis present

## 2024-08-10 DIAGNOSIS — D630 Anemia in neoplastic disease: Secondary | ICD-10-CM | POA: Diagnosis present

## 2024-08-10 DIAGNOSIS — Z7189 Other specified counseling: Secondary | ICD-10-CM

## 2024-08-10 DIAGNOSIS — Z8249 Family history of ischemic heart disease and other diseases of the circulatory system: Secondary | ICD-10-CM

## 2024-08-10 DIAGNOSIS — D735 Infarction of spleen: Secondary | ICD-10-CM | POA: Diagnosis present

## 2024-08-10 DIAGNOSIS — D696 Thrombocytopenia, unspecified: Secondary | ICD-10-CM | POA: Diagnosis present

## 2024-08-10 DIAGNOSIS — Z66 Do not resuscitate: Secondary | ICD-10-CM | POA: Diagnosis present

## 2024-08-10 DIAGNOSIS — Z79899 Other long term (current) drug therapy: Secondary | ICD-10-CM

## 2024-08-10 DIAGNOSIS — D649 Anemia, unspecified: Secondary | ICD-10-CM | POA: Diagnosis present

## 2024-08-10 DIAGNOSIS — I5032 Chronic diastolic (congestive) heart failure: Secondary | ICD-10-CM | POA: Diagnosis present

## 2024-08-10 DIAGNOSIS — D469 Myelodysplastic syndrome, unspecified: Secondary | ICD-10-CM

## 2024-08-10 DIAGNOSIS — I5033 Acute on chronic diastolic (congestive) heart failure: Secondary | ICD-10-CM

## 2024-08-10 DIAGNOSIS — E782 Mixed hyperlipidemia: Secondary | ICD-10-CM | POA: Diagnosis present

## 2024-08-10 DIAGNOSIS — N202 Calculus of kidney with calculus of ureter: Secondary | ICD-10-CM | POA: Diagnosis not present

## 2024-08-10 DIAGNOSIS — R7303 Prediabetes: Secondary | ICD-10-CM | POA: Diagnosis present

## 2024-08-10 DIAGNOSIS — D6189 Other specified aplastic anemias and other bone marrow failure syndromes: Principal | ICD-10-CM

## 2024-08-10 DIAGNOSIS — Z823 Family history of stroke: Secondary | ICD-10-CM

## 2024-08-10 DIAGNOSIS — D75839 Thrombocytosis, unspecified: Secondary | ICD-10-CM | POA: Diagnosis present

## 2024-08-10 DIAGNOSIS — Z515 Encounter for palliative care: Secondary | ICD-10-CM

## 2024-08-10 DIAGNOSIS — K219 Gastro-esophageal reflux disease without esophagitis: Secondary | ICD-10-CM | POA: Diagnosis present

## 2024-08-10 DIAGNOSIS — Z87891 Personal history of nicotine dependence: Secondary | ICD-10-CM

## 2024-08-10 DIAGNOSIS — Z860100 Personal history of colon polyps, unspecified: Secondary | ICD-10-CM

## 2024-08-10 DIAGNOSIS — D84821 Immunodeficiency due to drugs: Secondary | ICD-10-CM | POA: Diagnosis present

## 2024-08-10 DIAGNOSIS — K766 Portal hypertension: Secondary | ICD-10-CM | POA: Diagnosis not present

## 2024-08-10 DIAGNOSIS — Z1589 Genetic susceptibility to other disease: Secondary | ICD-10-CM

## 2024-08-10 DIAGNOSIS — D471 Chronic myeloproliferative disease: Secondary | ICD-10-CM | POA: Diagnosis present

## 2024-08-10 DIAGNOSIS — R161 Splenomegaly, not elsewhere classified: Secondary | ICD-10-CM | POA: Diagnosis present

## 2024-08-10 DIAGNOSIS — E739 Lactose intolerance, unspecified: Secondary | ICD-10-CM | POA: Diagnosis present

## 2024-08-10 LAB — COMPREHENSIVE METABOLIC PANEL WITH GFR
ALT: 76 U/L — ABNORMAL HIGH (ref 0–44)
AST: 21 U/L (ref 15–41)
Albumin: 3.6 g/dL (ref 3.5–5.0)
Alkaline Phosphatase: 179 U/L — ABNORMAL HIGH (ref 38–126)
Anion gap: 9 (ref 5–15)
BUN: 37 mg/dL — ABNORMAL HIGH (ref 8–23)
CO2: 26 mmol/L (ref 22–32)
Calcium: 8.5 mg/dL — ABNORMAL LOW (ref 8.9–10.3)
Chloride: 101 mmol/L (ref 98–111)
Creatinine, Ser: 1.21 mg/dL (ref 0.61–1.24)
GFR, Estimated: >60 mL/min
Glucose, Bld: 172 mg/dL — ABNORMAL HIGH (ref 70–99)
Potassium: 4.6 mmol/L (ref 3.5–5.1)
Sodium: 135 mmol/L (ref 135–145)
Total Bilirubin: 2.1 mg/dL — ABNORMAL HIGH (ref 0.0–1.2)
Total Protein: 5.1 g/dL — ABNORMAL LOW (ref 6.5–8.1)

## 2024-08-10 LAB — CBC WITH DIFFERENTIAL (CANCER CENTER ONLY)
Abs Immature Granulocytes: 0.28 K/uL — ABNORMAL HIGH (ref 0.00–0.07)
Basophils Absolute: 0.3 K/uL — ABNORMAL HIGH (ref 0.0–0.1)
Basophils Relative: 2 %
Eosinophils Absolute: 1.1 K/uL — ABNORMAL HIGH (ref 0.0–0.5)
Eosinophils Relative: 10 %
HCT: 17.5 % — ABNORMAL LOW (ref 39.0–52.0)
Hemoglobin: 5.9 g/dL — CL (ref 13.0–17.0)
Immature Granulocytes: 3 %
Lymphocytes Relative: 15 %
Lymphs Abs: 1.7 K/uL (ref 0.7–4.0)
MCH: 33.9 pg (ref 26.0–34.0)
MCHC: 33.7 g/dL (ref 30.0–36.0)
MCV: 100.6 fL — ABNORMAL HIGH (ref 80.0–100.0)
Monocytes Absolute: 3.2 K/uL — ABNORMAL HIGH (ref 0.1–1.0)
Monocytes Relative: 29 %
Neutro Abs: 4.7 K/uL (ref 1.7–7.7)
Neutrophils Relative %: 41 %
Platelet Count: 47 K/uL — ABNORMAL LOW (ref 150–400)
RBC: 1.74 MIL/uL — ABNORMAL LOW (ref 4.22–5.81)
RDW: 22.9 % — ABNORMAL HIGH (ref 11.5–15.5)
WBC Count: 11.2 K/uL — ABNORMAL HIGH (ref 4.0–10.5)
nRBC: 0.2 % (ref 0.0–0.2)

## 2024-08-10 LAB — CBC WITH DIFFERENTIAL/PLATELET
Abs Immature Granulocytes: 0.3 K/uL — ABNORMAL HIGH (ref 0.00–0.07)
Basophils Absolute: 0 K/uL (ref 0.0–0.1)
Basophils Relative: 0 %
Eosinophils Absolute: 1 K/uL — ABNORMAL HIGH (ref 0.0–0.5)
Eosinophils Relative: 9 %
HCT: 17.4 % — ABNORMAL LOW (ref 39.0–52.0)
Hemoglobin: 5.3 g/dL — CL (ref 13.0–17.0)
Lymphocytes Relative: 20 %
Lymphs Abs: 2.1 K/uL (ref 0.7–4.0)
MCH: 31.9 pg (ref 26.0–34.0)
MCHC: 30.5 g/dL (ref 30.0–36.0)
MCV: 104.8 fL — ABNORMAL HIGH (ref 80.0–100.0)
Metamyelocytes Relative: 2 %
Monocytes Absolute: 1.7 K/uL — ABNORMAL HIGH (ref 0.1–1.0)
Monocytes Relative: 16 %
Myelocytes: 1 %
Neutro Abs: 5.5 K/uL (ref 1.7–7.7)
Neutrophils Relative %: 52 %
Platelets: 55 K/uL — ABNORMAL LOW (ref 150–400)
RBC: 1.66 MIL/uL — ABNORMAL LOW (ref 4.22–5.81)
RDW: 23.2 % — ABNORMAL HIGH (ref 11.5–15.5)
Smear Review: NORMAL
WBC: 10.6 K/uL — ABNORMAL HIGH (ref 4.0–10.5)
nRBC: 0 % (ref 0.0–0.2)

## 2024-08-10 LAB — HEMOGLOBIN AND HEMATOCRIT, BLOOD
HCT: 19.9 % — ABNORMAL LOW (ref 39.0–52.0)
Hemoglobin: 6.2 g/dL — CL (ref 13.0–17.0)

## 2024-08-10 LAB — TROPONIN T, HIGH SENSITIVITY
Troponin T High Sensitivity: 16 ng/L (ref 0–19)
Troponin T High Sensitivity: <15 ng/L (ref 0–19)

## 2024-08-10 LAB — SAMPLE TO BLOOD BANK

## 2024-08-10 LAB — PRO BRAIN NATRIURETIC PEPTIDE: Pro Brain Natriuretic Peptide: 613 pg/mL — ABNORMAL HIGH (ref <300.0)

## 2024-08-10 LAB — PREPARE RBC (CROSSMATCH)

## 2024-08-10 MED ORDER — ONDANSETRON HCL 4 MG/2ML IJ SOLN
4.0000 mg | Freq: Four times a day (QID) | INTRAMUSCULAR | Status: DC | PRN
  Administered 2024-08-13: 4 mg via INTRAVENOUS
  Filled 2024-08-10: qty 2

## 2024-08-10 MED ORDER — SODIUM CHLORIDE 0.9% FLUSH: 10.0000 mL | INTRAVENOUS | Status: DC | PRN

## 2024-08-10 MED ORDER — SODIUM CHLORIDE 0.9 % IV SOLN: Freq: Once | INTRAVENOUS | Status: AC

## 2024-08-10 MED ORDER — IOHEXOL 300 MG/ML  SOLN
100.0000 mL | Freq: Once | INTRAMUSCULAR | Status: AC | PRN
  Administered 2024-08-10: 100 mL via INTRAVENOUS

## 2024-08-10 MED ORDER — ACETAMINOPHEN 325 MG PO TABS
650.0000 mg | ORAL_TABLET | Freq: Once | ORAL | Status: AC
  Administered 2024-08-10: 650 mg via ORAL
  Filled 2024-08-10: qty 2

## 2024-08-10 MED ORDER — ALBUTEROL SULFATE (2.5 MG/3ML) 0.083% IN NEBU: 2.5000 mg | INHALATION_SOLUTION | RESPIRATORY_TRACT | Status: DC | PRN

## 2024-08-10 MED ORDER — SODIUM CHLORIDE 0.9% FLUSH
10.0000 mL | Freq: Two times a day (BID) | INTRAVENOUS | Status: DC
  Administered 2024-08-10 – 2024-08-13 (×5): 10 mL

## 2024-08-10 MED ORDER — FUROSEMIDE 10 MG/ML IJ SOLN
20.0000 mg | Freq: Once | INTRAMUSCULAR | Status: AC
  Administered 2024-08-11: 20 mg via INTRAVENOUS
  Filled 2024-08-10: qty 2

## 2024-08-10 MED ORDER — METHYLPREDNISOLONE SODIUM SUCC 40 MG IJ SOLR
40.0000 mg | Freq: Once | INTRAMUSCULAR | Status: AC
  Administered 2024-08-10: 40 mg via INTRAVENOUS
  Filled 2024-08-10: qty 1

## 2024-08-10 MED ORDER — TRAZODONE HCL 50 MG PO TABS: 25.0000 mg | ORAL_TABLET | Freq: Every evening | ORAL | Status: DC | PRN

## 2024-08-10 MED ORDER — ACETAMINOPHEN 325 MG PO TABS
650.0000 mg | ORAL_TABLET | Freq: Four times a day (QID) | ORAL | Status: DC | PRN
  Administered 2024-08-12: 650 mg via ORAL
  Filled 2024-08-10: qty 2

## 2024-08-10 MED ORDER — FUROSEMIDE 20 MG PO TABS
20.0000 mg | ORAL_TABLET | Freq: Every day | ORAL | Status: DC
  Filled 2024-08-10: qty 1

## 2024-08-10 MED ORDER — ONDANSETRON HCL 4 MG PO TABS
4.0000 mg | ORAL_TABLET | Freq: Four times a day (QID) | ORAL | Status: DC | PRN
  Administered 2024-08-12: 4 mg via ORAL
  Filled 2024-08-10: qty 1

## 2024-08-10 MED ORDER — ACETAMINOPHEN 650 MG RE SUPP: 650.0000 mg | Freq: Four times a day (QID) | RECTAL | Status: DC | PRN

## 2024-08-10 MED ORDER — SODIUM CHLORIDE 0.9% IV SOLUTION: Freq: Once | INTRAVENOUS | Status: DC

## 2024-08-10 MED ORDER — SODIUM CHLORIDE 0.9 % IV BOLUS: 1000.0000 mL | Freq: Once | INTRAVENOUS | Status: DC

## 2024-08-10 MED ORDER — CHLORHEXIDINE GLUCONATE CLOTH 2 % EX PADS
6.0000 | MEDICATED_PAD | Freq: Every day | CUTANEOUS | Status: DC
  Administered 2024-08-11 – 2024-08-12 (×2): 6 via TOPICAL

## 2024-08-10 NOTE — H&P (Signed)
 History and Physical  Fernando Lee DOB: 11-Aug-1946 DOA: 08/10/2024  PCP: Dayna Motto, DO   Chief Complaint: Anemia  HPI: Fernando Lee is a 78 y.o. 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  He has been followed closely by his oncologist Dr. Onesimo and most recently had a unit of blood transfused on 10/21.  He had a routine follow-up today in their office, this morning he felt incredibly fatigued and suspected that he was anemic.  Hemoglobin in the office was 5.9, on recheck in the emergency department it is 5.3.  Denies any bleeding or any other new concerns.  He does intermittently get some splenic discomfort, says there has been no change in this.  Review of Systems: Please see HPI for pertinent positives and negatives. A complete 10 system review of systems are otherwise negative.  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 His smoking use included cigarettes. He has never used smokeless tobacco. He reports current alcohol use of about 1.0 standard drink of alcohol per week. No history on file for drug use.  Allergies  Allergen Reactions   Lactose Intolerance (Gi) Diarrhea    Family History  Problem Relation Age of Onset   Stroke Brother    Congestive Heart Failure Brother      Prior to Admission medications   Medication Sig Start Date End Date Taking? Authorizing Provider  acetaminophen  (TYLENOL ) 325 MG tablet Take 2 tablets (650 mg total) by mouth every 6 (six) hours as needed for mild pain (pain score 1-3) (or Fever >/= 101). 09/20/23   Regalado, Belkys A, MD  alfuzosin  (UROXATRAL ) 10 MG 24 hr tablet Take 10 mg by mouth at bedtime.    [provider]  B Complex Vitamins (B COMPLEX PO) Take 1 tablet by mouth daily.    [provider]  Cholecalciferol  (VITAMIN D3) 50 MCG (2000 UT) TABS Take 2,000 Units by mouth daily.    [provider]  Deferasirox  (JADENU ) 180 MG TABS Take 2 tablets (360 mg total) by mouth daily. Take on an empty stomach. 05/18/24   Onesimo Emaline Brink, MD  docusate sodium  (COLACE) 100 MG capsule Take 100 mg by mouth  daily.    [provider]  fish oil-omega-3 fatty acids  1000 MG capsule Take 1 g by mouth daily.    [provider]  furosemide  (LASIX ) 20 MG tablet Take 1 tablet by mouth once daily 07/17/24   Kale, Gautam Kishore, MD  GLUCOSAMINE CHONDROITIN MSM PO Take 1 tablet by mouth daily. Patient not taking: Reported on 08/03/2024    [provider]  momelotinib dihydrochloride (OJJAARA) 100 MG tablet Take 1 tablet (100 mg total) by mouth daily. 08/04/24   Onesimo Emaline Brink, MD  Multiple Vitamin (MULTIVITAMIN) tablet Take 1 tablet by mouth daily with breakfast.    [provider]  potassium chloride  SA (KLOR-CON  M) 20  MEQ tablet Take 1 tablet by mouth twice daily Patient taking differently: Take 20 mEq by mouth daily. 07/24/24   Onesimo Emaline Brink, MD  Turmeric (QC TUMERIC COMPLEX PO) Take 750 mg by mouth daily.    [provider]  valACYclovir  (VALTREX ) 500 MG tablet Take 1 tablet (500 mg total) by mouth 2 (two) times daily. 04/18/24   Onesimo Emaline Brink, MD  vitamin C  (ASCORBIC ACID ) 250 MG tablet Take 500 mg by mouth daily.    [provider]    Physical Exam: BP (!) 89/57   Pulse 77   Temp 97.8 F (36.6 C) (Oral)   Resp 18   SpO2 100%  General:  Alert, oriented, calm, in no acute distress, jaundiced Neck: supple, no masses, trachea mildline  Cardiovascular: RRR, no murmurs or rubs, no peripheral edema  Respiratory: clear to auscultation bilaterally, no wheezes, no crackles  Abdomen: soft, nontender, distended, normal bowel tones heard  Skin: dry, no rashes  Musculoskeletal: no joint effusions, normal range of motion  Psychiatric: appropriate affect, normal speech  Neurologic: extraocular muscles intact, clear speech, moving all extremities with intact sensorium         Labs on Admission:  Basic Metabolic Panel: Recent Labs  Lab 08/03/24 1444 08/04/24 0455 08/10/24 1019  NA 133* 136 135  K 4.4 4.3 4.6  CL 99 103 101  CO2 25 26 26   GLUCOSE 204* 103* 172*  BUN 26* 23 37*  CREATININE 0.92 0.87 1.21  CALCIUM  8.2* 8.1* 8.5*  MG  --  2.1  --    Liver Function Tests: Recent Labs  Lab 08/03/24 1444 08/04/24 0455 08/10/24 1019  AST 24 22 21   ALT 78* 70* 76*  ALKPHOS 160* 164* 179*  BILITOT 1.9* 2.5* 2.1*  PROT 5.0* 4.7* 5.1*  ALBUMIN 3.8 3.6 3.6   No results for input(s): LIPASE, AMYLASE in the last 168 hours. No results for input(s): AMMONIA in the last 168 hours. CBC: Recent Labs  Lab 08/03/24 1444 08/04/24 0455 08/06/24 1250 08/10/24 0733 08/10/24 1019  WBC 6.0 7.1 8.0 11.2* 10.6*  NEUTROABS 3.3  --  3.8 4.7 5.5  HGB 6.4* 6.7* 6.5* 5.9* 5.3*   HCT 20.1* 21.1* 19.5* 17.5* 17.4*  MCV 101.0* 98.1 97.5 100.6* 104.8*  PLT 70* 67* 54* 47* 55*   Cardiac Enzymes: No results for input(s): CKTOTAL, CKMB, CKMBINDEX, TROPONINI in the last 168 hours. BNP (last 3 results) No results for input(s): BNP in the last 8760 hours.  ProBNP (last 3 results) Recent Labs    08/03/24 1444 08/10/24 1019  PROBNP 481.0* 613.0*    CBG: Recent Labs  Lab 08/03/24 2208  GLUCAP 113*    Radiological Exams on Admission: CT ABDOMEN PELVIS W CONTRAST Result Date: 08/10/2024 EXAM: CT ABDOMEN AND PELVIS WITH  CONTRAST 08/10/2024 11:51:11 AM TECHNIQUE: CT of the abdomen and pelvis was performed with the administration of 100 mL of iohexol  (OMNIPAQUE ) 300 MG/ML solution. Multiplanar reformatted images are provided for review. Automated exposure control, iterative reconstruction, and/or weight-based adjustment of the mA/kV was utilized to reduce the radiation dose to as low as reasonably achievable. COMPARISON: 08/03/2024 CLINICAL HISTORY: Abdominal pain, acute, nonlocalized; Striae of MDS with prior splenic sequestration, eval for worsening splenomegaly, hepatomegaly. FINDINGS: LOWER CHEST: Bibasilar compressive atelectasis. Moderate bilateral pleural effusions. LIVER: Unchanged hepatomegaly. GALLBLADDER AND BILE DUCTS: Cholelithiasis. No biliary ductal dilatation. SPLEEN: Stable marked splenomegaly measuring 21.2 cm in the coronal plane. New wedge-shaped hypodensity in lateral spleen measuring 5.9 x 2.6 cm, suggesting early splenic infarct. PANCREAS: No acute abnormality. ADRENAL GLANDS: No acute abnormality. KIDNEYS, URETERS AND BLADDER: Nonobstructive right renal calculi up to 4 mm. Unchanged 8 mm stone in distal left ureter, 1 cm proximal to ureterovesicular junction seen on axial image 80 series 2. Unchanged 8 mm bladder stone. No hydronephrosis. No perinephric or periureteral stranding. GI AND BOWEL: Small hiatal hernia. Stomach demonstrates no acute  abnormality. There is no bowel obstruction. PERITONEUM AND RETROPERITONEUM: Stable small volume ascites. No free air. VASCULATURE: Aorta is normal in caliber. Aortic atherosclerosis. Portal venous hypertension with large splenic, gastric, and paraesophageal varices. LYMPH NODES: No lymphadenopathy. REPRODUCTIVE ORGANS: Enlarged prostate. BONES AND SOFT TISSUES: Multilevel spine degenerative changes. No acute osseous abnormality. No focal soft tissue abnormality. IMPRESSION: 1. New wedge-shaped hypodensity in the lateral spleen compatible with early splenic infarct, on a background of marked splenomegaly. 2. Unchanged 8 mm distal left ureteral stone approximately 1 cm proximal to the ureterovesical junction, with additional nonobstructive right renal calculi up to 4 mm. 3. Unchanged 8 mm bladder stone. 4. Unchanged hepatomegaly with evidence of portal venous hypertension. 5. Moderate bilateral pleural effusions with bibasilar compressive atelectasis. 6. Aortic atherosclerosis. Electronically signed by: Ryan Chess MD 08/10/2024 12:26 PM EDT RP Workstation: [id number]   Assessment/Plan  Fernando Lee is a 78 y.o. 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  Recurrent symptomatic anemia-multifactorial in the setting of his myeloproliferative disorder, associated MDS, massive splenomegaly, etc.  No bleeding or other complications. -Observation admission -Transfuse 1 unit PRBC, will be transfused slowly over 4 hours discussed with RN. -Check 1 hour posttransfusion hematocrit, approximately 5 PM.  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  Patient without any new symptomatology.  Grade 2 diastolic dysfunction-patient currently appears euvolemic, takes Lasix  20 mg p.o. daily. -Will give a dose of 20 mg IV Lasix  this evening to counteract volume from his blood transfusion -Plan to resume Lasix  20 mg p.o. daily in the morning  DVT prophylaxis: SCDs only    Code Status: Limited: Do not attempt resuscitation (DNR) -DNR-LIMITED -Do Not Intubate/DNI -we had a detailed conversation about his goals of care, and the nature of DNR/DNI.  He is quite clear he would like to be DNR/DNI.  Says that he has discussed the issue with his wife in the past, but never specifically made himself DNR.  He would like to be DNR now.  States that he will speak about this with his wife when she returns from home, I let him know I am happy to speak with  him and his wife once again about this issue when she returns.  Consults called: Oncology Dr. Onesimo  Admission status: Observation  Time spent: 65 minutes  Danaija Eskridge CHRISTELLA Gail MD [facility] Pager [phone]  If 7PM-7AM, please contact night-coverage [email or web address] Password [email or web address]  08/10/2024, 1:41 PM

## 2024-08-10 NOTE — ED Provider Notes (Signed)
 [place] EMERGENCY DEPARTMENT AT [facility] Provider Note   CSN: [id number] Arrival date & time: 08/10/24  9049     History Chief Complaint  Patient presents with   Abnormal Lab    HPI: Fernando Lee is a 78 y.o. male with history pertinent myelodysplastic syndrome with associated neoplasia, JAK2 mutation, GERD, prior ventricular tachycardia, splenomegaly, symptomatic anemia, diastolic heart failure, hyperlipidemia who presents complaining of abnormal labs. Patient arrived via walk-in accompanied by wife, Fernando Lee.  History provided by patient and spouse/partner.  No interpreter required during this encounter.  Patient and wife report that he has had continued difficulties with his blood counts since being diagnosed with myelodysplastic syndrome.  Reports that he has received proximately 130 prior transfusions with his most recent prior being 3 days ago.  Reports that he received a transfusion for low hemoglobin, however was found to have worsened hemoglobin today.  Reports that he has also had shortness of breath that is worsening from prior, and worsened bilateral lower extremity swelling for approximately 2 weeks.  Reports that he does take Lasix  at home, and recently he was increased to 20 twice daily by his oncologist.  Denies new or different chest pain, fever, chills, nausea, vomiting, diarrhea.  Patient's recorded medical, surgical, social, medication list and allergies were reviewed in the Snapshot window as part of the initial history.   Prior to Admission medications   Medication Sig Start Date End Date Taking? Authorizing Provider  acetaminophen  (TYLENOL ) 325 MG tablet Take 2 tablets (650 mg total) by mouth every 6 (six) hours as needed for mild pain (pain score 1-3) (or Fever >/= 101). 09/20/23   Regalado, Belkys A, MD  alfuzosin  (UROXATRAL ) 10 MG 24 hr tablet Take 10 mg by mouth at bedtime.    [provider]  B Complex Vitamins (B COMPLEX PO) Take 1 tablet  by mouth daily.    [provider]  Cholecalciferol  (VITAMIN D3) 50 MCG (2000 UT) TABS Take 2,000 Units by mouth daily.    [provider]  Deferasirox  (JADENU ) 180 MG TABS Take 2 tablets (360 mg total) by mouth daily. Take on an empty stomach. 05/18/24   Onesimo Emaline Brink, MD  docusate sodium  (COLACE) 100 MG capsule Take 100 mg by mouth daily.    [provider]  fish oil-omega-3 fatty acids  1000 MG capsule Take 1 g by mouth daily.    [provider]  furosemide  (LASIX ) 20 MG tablet Take 1 tablet by mouth once daily 07/17/24   Kale, Gautam Kishore, MD  GLUCOSAMINE CHONDROITIN MSM PO Take 1 tablet by mouth daily. Patient not taking: Reported on 08/03/2024    [provider]  momelotinib dihydrochloride (OJJAARA) 100 MG tablet Take 1 tablet (100 mg total) by mouth daily. 08/04/24   Onesimo Emaline Brink, MD  Multiple Vitamin (MULTIVITAMIN) tablet Take 1 tablet by mouth daily with breakfast.    [provider]  potassium chloride  SA (KLOR-CON  M) 20 MEQ tablet Take 1 tablet by mouth twice daily Patient taking differently: Take 20 mEq by mouth daily. 07/24/24   Onesimo Emaline Brink, MD  Turmeric (QC TUMERIC COMPLEX PO) Take 750 mg by mouth daily.    [provider]  valACYclovir  (VALTREX ) 500 MG tablet Take 1 tablet (500 mg total) by mouth 2 (two) times daily. 04/18/24   Onesimo Emaline Brink, MD  vitamin C  (ASCORBIC ACID ) 250 MG tablet Take 500 mg by mouth daily.    [provider]  Allergies: Lactose intolerance (gi)   Review of Systems   ROS as per HPI  Physical Exam Updated Vital Signs BP (!) 102/57   Pulse 83   Temp 98.7 F (37.1 C) (Oral)   Resp 18   SpO2 94%  Physical Exam Vitals and nursing note reviewed.  Constitutional:      General: He is not in acute distress.    Appearance: Normal appearance. He is well-developed.  HENT:     Head: Normocephalic and atraumatic.  Eyes:     Extraocular Movements:  Extraocular movements intact.     Conjunctiva/sclera: Conjunctivae normal.  Cardiovascular:     Rate and Rhythm: Normal rate and regular rhythm.     Pulses: Normal pulses.     Heart sounds: No murmur heard. Pulmonary:     Effort: Pulmonary effort is normal. No respiratory distress.     Breath sounds: Normal breath sounds.  Abdominal:     Palpations: Abdomen is soft. There is mass (Hepatosplenomegaly).     Tenderness: There is no abdominal tenderness.  Musculoskeletal:        General: No swelling.     Cervical back: Neck supple.     Right lower leg: Edema present.     Left lower leg: Edema present.  Skin:    General: Skin is warm and dry.     Capillary Refill: Capillary refill takes less than 2 seconds.  Neurological:     General: No focal deficit present.     Mental Status: He is alert and oriented to person, place, and time.  Psychiatric:        Mood and Affect: Mood normal.     ED Course/ Medical Decision Making/ A&P    Procedures .Critical Care  Performed by: Rogelia Jerilynn RAMAN, MD Authorized by: Rogelia Jerilynn RAMAN, MD   Critical care provider statement:    Critical care time (minutes):  30   Critical care was necessary to treat or prevent imminent or life-threatening deterioration of the following conditions: Severe anemia requiring blood transfusion.   Critical care was time spent personally by me on the following activities:  Development of treatment plan with patient or surrogate, discussions with consultants, evaluation of patient's response to treatment, examination of patient, ordering and review of laboratory studies, ordering and review of radiographic studies, ordering and performing treatments and interventions, pulse oximetry, re-evaluation of patient's condition and review of old charts   I assumed direction of critical care for this patient from another provider in my specialty: no     Care discussed with: admitting provider      Medications Ordered in  ED Medications  furosemide  (LASIX ) tablet 20 mg (has no administration in time range)  furosemide  (LASIX ) injection 20 mg (has no administration in time range)  acetaminophen  (TYLENOL ) tablet 650 mg (has no administration in time range)    Or  acetaminophen  (TYLENOL ) suppository 650 mg (has no administration in time range)  traZODone (DESYREL) tablet 25 mg (has no administration in time range)  ondansetron  (ZOFRAN ) tablet 4 mg (has no administration in time range)    Or  ondansetron  (ZOFRAN ) injection 4 mg (has no administration in time range)  albuterol  (PROVENTIL ) (2.5 MG/3ML) 0.083% nebulizer solution 2.5 mg (has no administration in time range)  methylPREDNISolone  sodium succinate (SOLU-MEDROL ) 40 mg/mL injection 40 mg (has no administration in time range)  acetaminophen  (TYLENOL ) tablet 650 mg (has no administration in time range)  iohexol  (OMNIPAQUE ) 300 MG/ML solution 100 mL (100 mLs Intravenous Contrast  Given 08/10/24 1150)  methylPREDNISolone  sodium succinate (SOLU-MEDROL ) 40 mg/mL injection 40 mg (40 mg Intravenous Given 08/10/24 1204)  acetaminophen  (TYLENOL ) tablet 650 mg (650 mg Oral Given 08/10/24 1204)  0.9 %  sodium chloride  infusion ( Intravenous New Bag/Given 08/10/24 1322)    Medical Decision Making:   RONDLE LOHSE is a 78 y.o. male who presents for anemia despite recent transfusion as per above.  Physical exam is pertinent for palpable hepatosplenomegaly.   The differential includes but is not limited to progression of myelodysplastic syndrome, heart failure, blood loss anemia.  Independent historian: Spouse/partner  External data reviewed: Notes reviewed outpatient oncology notes  Labs: Ordered, Independent interpretation, and Details: CMP without AKI, imaging electrolyte derangement, mild elevation of LFTs similar to prior. CBC with redemonstrated anemia to 5.3,  mild nonspecific leukocytosis to 10.6, similar to outpatient labs this morning.  Similar  thrombocytopenia on comparison to prior.  Troponin WNL.  BNP mildly elevated at 631  Radiology: Ordered, Independent interpretation, Details: Personally viewed CT of the abdomen pelvis, I do appreciate hepatosplenomegaly.  I do not appreciate active extravasation, focal fluid collection, obstructive bowel gas pattern, and All images reviewed independently.  Agree with radiology report at this time.   CT ABDOMEN PELVIS W CONTRAST Result Date: 08/10/2024 EXAM: CT ABDOMEN AND PELVIS WITH CONTRAST 08/10/2024 11:51:11 AM TECHNIQUE: CT of the abdomen and pelvis was performed with the administration of 100 mL of iohexol  (OMNIPAQUE ) 300 MG/ML solution. Multiplanar reformatted images are provided for review. Automated exposure control, iterative reconstruction, and/or weight-based adjustment of the mA/kV was utilized to reduce the radiation dose to as low as reasonably achievable. COMPARISON: 08/03/2024 CLINICAL HISTORY: Abdominal pain, acute, nonlocalized; Striae of MDS with prior splenic sequestration, eval for worsening splenomegaly, hepatomegaly. FINDINGS: LOWER CHEST: Bibasilar compressive atelectasis. Moderate bilateral pleural effusions. LIVER: Unchanged hepatomegaly. GALLBLADDER AND BILE DUCTS: Cholelithiasis. No biliary ductal dilatation. SPLEEN: Stable marked splenomegaly measuring 21.2 cm in the coronal plane. New wedge-shaped hypodensity in lateral spleen measuring 5.9 x 2.6 cm, suggesting early splenic infarct. PANCREAS: No acute abnormality. ADRENAL GLANDS: No acute abnormality. KIDNEYS, URETERS AND BLADDER: Nonobstructive right renal calculi up to 4 mm. Unchanged 8 mm stone in distal left ureter, 1 cm proximal to ureterovesicular junction seen on axial image 80 series 2. Unchanged 8 mm bladder stone. No hydronephrosis. No perinephric or periureteral stranding. GI AND BOWEL: Small hiatal hernia. Stomach demonstrates no acute abnormality. There is no bowel obstruction. PERITONEUM AND RETROPERITONEUM: Stable  small volume ascites. No free air. VASCULATURE: Aorta is normal in caliber. Aortic atherosclerosis. Portal venous hypertension with large splenic, gastric, and paraesophageal varices. LYMPH NODES: No lymphadenopathy. REPRODUCTIVE ORGANS: Enlarged prostate. BONES AND SOFT TISSUES: Multilevel spine degenerative changes. No acute osseous abnormality. No focal soft tissue abnormality. IMPRESSION: 1. New wedge-shaped hypodensity in the lateral spleen compatible with early splenic infarct, on a background of marked splenomegaly. 2. Unchanged 8 mm distal left ureteral stone approximately 1 cm proximal to the ureterovesical junction, with additional nonobstructive right renal calculi up to 4 mm. 3. Unchanged 8 mm bladder stone. 4. Unchanged hepatomegaly with evidence of portal venous hypertension. 5. Moderate bilateral pleural effusions with bibasilar compressive atelectasis. 6. Aortic atherosclerosis. Electronically signed by: Ryan Chess MD 08/10/2024 12:26 PM EDT RP Workstation: [id number]   EKG/Medicine tests: Not indicated EKG Interpretation:                  Interventions: Solu-Medrol , Tylenol , saline, PRBCs  See the EMR for full details regarding lab and imaging  results.  Patient presents to the emergency department with anemia on outpatient labs in the setting of known myelodysplastic syndrome, patient has had recent transfusion, however has persistent anemia.  Patient also with worsening lower extremity edema, though saturating well on room air, no increased work of breathing.  Initial labs obtained, and do redemonstrate known severe anemia.  Will transfuse with PRBCs, patient typically requires pretreatment with Tylenol  and Solu-Medrol  given prior febrile transfusion reaction, and was pretreated with the same prior to this.  Patient with low normotension, therefore also started on sodium chloride  infusion at this time, patient does have evidence of peripheral volume overload, however given soft blood  pressures, will hold diuresis at this time.  Given patient reports worsening splenomegaly, CT of the abdomen pelvis was obtained and does demonstrate splenic infarct.  Given anemia as well as thrombocytopenia, patient was not started on blood thinners initially, feel that patient requires input from oncology prior to initiation of anticoagulation.  Consulted patient's oncologist, Dr. Onesimo, however unfortunately did not hear back from Dr. Onesimo prior to the end of my shift at 330.  Hospitalist consulted for admission, discussed patient with Dr. Zella who accepted the patient to his service, no additional acute events while patient was under my care.  Presentation is most consistent with acute life/limb-threatening illness  Discussion of management or test interpretations with external provider(s): Dr. Zella, hospitalists  Risk Drugs:Prescription drug management Treatment: Decision regarding hospitalization Critical Care: 31 minutes  Disposition: ADMIT: I believe the patient requires admission for further care and management. The patient was admitted to hospitalist with oncology consult. Please see inpatient provider note for additional treatment plan details.   MDM generated using voice dictation software and may contain dictation errors.  Please contact me for any clarification or with any questions.  Clinical Impression:  1. 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

## 2024-08-10 NOTE — Progress Notes (Signed)
   08/10/24 2000  Assess: MEWS Score  Temp 98.6 F (37 C)  BP (!) 110/55  MAP (mmHg) 72  Pulse Rate 78  Resp 16  SpO2 95 %  O2 Device Room Air  Assess: MEWS Score  MEWS Temp 0  MEWS Systolic 0  MEWS Pulse 0  MEWS RR 0  MEWS LOC 0  MEWS Score 0  MEWS Score Color Green  Provider Notification  Provider Name/Title Lavanda Horns, NP  Date Provider Notified 08/10/24  Time Provider Notified 2001  Method of Notification [facility])  Notification Reason Critical Result (Hgb 6.2)  Provider response No new orders (1 unit PRBC to be transfused)  Date of Provider Response 08/10/24  Time of Provider Response 2002  Assess: SIRS CRITERIA  SIRS Temperature  0  SIRS Respirations  0  SIRS Pulse 0  SIRS WBC 0  SIRS Score Sum  0

## 2024-08-10 NOTE — ED Notes (Signed)
 Blood bank has blood ready at 11:14am, notified Alyssa and Alex,RN's.at 11:14am

## 2024-08-10 NOTE — ED Notes (Addendum)
 Critical value Hgb 5.3 MD Rogelia made aware

## 2024-08-10 NOTE — ED Triage Notes (Signed)
 Pt arrives from cancer center due to low Hgb 5.9. A unit of blood last received Tuesday with Hgb 6. Port accessed, per nurse port is not a power port.

## 2024-08-10 NOTE — ED Notes (Signed)
 Provider Rogelia, MD notified of down trending BP.

## 2024-08-10 NOTE — Plan of Care (Signed)
  Problem: Education: Goal: Knowledge of General Education information will improve Description: Including pain rating scale, medication(s)/side effects and non-pharmacologic comfort measures Outcome: Progressing   Problem: Clinical Measurements: Goal: Ability to maintain clinical measurements within normal limits will improve Outcome: Progressing   Problem: Coping: Goal: Level of anxiety will decrease Outcome: Progressing   

## 2024-08-10 NOTE — Progress Notes (Signed)
 HEMATOLOGY ONCOLOGY PROGRESS NOTE  Date of service: 08/10/2024  Patient Care Team: Dayna Motto, DO as PCP - General (Family Medicine)  CHIEF COMPLAINT/PURPOSE OF CONSULTATION: Follow-up for continued evaluation and management of JAK2 positive myeloproliferative neoplasm/MDS   HISTORY OF PRESENTING ILLNESS: (02/23/2021) Fernando Lee is a wonderful 78 y.o. male who has been referred to us  by Dr. Cyrena for evaluation and management of leukocytosis and thrombocytosis. The pt reports that he is doing well overall. We are joined today by the pt's wife, Fernando Lee.   The pt reports that he has been referred here by his PCP, Dr. Cyrena, on routine checkups that were done. The pt notes he was suddenly experiencing some heart fluttering that he had bene putting off for some time until he went in April to see his PCP. The pt notes they did an EKG that showed he had irregular heartbeat. The pt notes he drinks around two cups of coffee daily and his doctor instructed him to stop drinking coffee. The pt notes that Dr. Cyrena just called it irregular heartbeat, not an extra heartbeat or calling it Atrial flutter. The pt notes he had been having chocolates at night as well that had caffeine. Since stopping the caffeine intake, the pt's heartbeat had been regular.  The labs were performed as just a full checkup and the findings of thrombocytosis and leukocytosis were incidental.   The pt notes that his labs from October 2021 showed WBC of 8.7K and Plt of 373K. All prior labs to this he had the same normal levels. These labs in April were the first with irregular levels. The pt notes that his heart symptoms felt like palpitations and he would need to breathe deeper to help with this. The pt denied any supplements or medication changes in the last six months. The pt notes that after he stopped the coffee, he tried decaf and that even made the heartbeat affected again.   Outside of the palpitations, the pt has started  having headaches due to caffeine withdrawals. The pt notes he had more intermittent and milder headaches prior that is a band like feeling. The pt notes npo neurological symptoms. The pt denies being on any steroids or any recent infections. The pt notes he has lost five pounds over the last six months. He feels well currently.  The pt notes he is due for his next colonoscopy. The pt denies any smoking since 1971.    The pt denies any tick bites and notes that he has a dog at home. He denies any skin rashes or any acute infection issues. The pt denies any familial history of blood disorders. He notes that his brother had esophageal cancer and notes some issues with heart attacks. The pt notes his father passed away with cancer when he was 80 years old. The pt notes his sister has RA, but denies any joint issues. 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 This is not bothersome nor hindering to the pt.    Lab results 02/03/2021 of CBC w/diff and CMP is as follows: all values are WNL except for WBC of 16.2K, RDW of 16.0, Plt of 687K,  Neutro Abs of 11.8K, Mono Abs of 1.5K, Baso Abs of 0.5K, Eos Abs of 1.0K, Glucose of 136. 01/31/2021 CBC showed WBC of 16.2K and Plt of 699K.   On review of systems, pt reports gradual weight loss and denies vision changes, SOB, chest  pain, infection issues, fevers, chills, night sweats, sudden weight changes, sore throat, seasonal allergies, acute cough, acute back pain, chest pain, acute painful and swollen joints, and any other symptoms.    SUMMARY OF ONCOLOGIC HISTORY: Oncology History  MDS (myelodysplastic syndrome) (HCC)  05/26/2022 Initial Diagnosis   MDS (myelodysplastic syndrome) (HCC)   05/31/2022 - 08/11/2022 Chemotherapy   Patient is on Treatment Plan : MYELODYSPLASIA Luspatercept  q21d     09/06/2022 - 02/25/2023 Chemotherapy   Patient is on Treatment Plan : MYELODYSPLASIA  Azacitidine  IV D1-5 q28d       INTERVAL HISTORY: Fernando Lee is a 78 y.o. male who is here today for continued evaluation and management of JAK2 positive myeloproliferative neoplasm/MDS  . accompanied by his wife.  he was last seen by me on 05/18/2024; at the time he mentioned experiencing persisting fatigue, some wobbling while walking that improved following infusions, and occasional abdominal discomfort.   On 06/29/2024, he was seen by Neomi Johnston DASEN, PA-C and reported persisting fatigue, but no other complaints.    On 08/04/2024, he was seen in the ED with abdominal pain, anemia, and splenomegaly. Was admitted with increased abdominal distention lower extremity swelling fatigue and jaundice, and when I saw him he reported feeling better after receiving another unit of blood and with diuresis. Denies any overt bleeding.   Today, he says that he is not feeling well today following his hospitalization.   Denies any dizziness, but does endorse weakness. He says that his abdominal pain has improved, though he is experiencing some right-sided abdominal pain. He reports that he did start Ojjaara yesterday.  Wife notes that legs were extremely swollen one day.  REVIEW OF SYSTEMS:    10 Point review of systems of done and is negative except as noted above.  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   Kidney stones    Male erectile dysfunction, unspecified    Mixed dyslipidemia    Mixed hyperlipidemia    Ocular migraine    PAC (premature atrial contraction)    Personal history of colonic polyps    Prediabetes    Residual hemorrhoidal skin tags     Spleen enlarged    nov 2023 hospitalized   Unspecified hemorrhoids     SURGICAL HISTORY Past Surgical History:  Procedure Laterality Date   BONE MARROW BIOPSY     IR IMAGING GUIDED PORT INSERTION  09/14/2022   KIDNEY STONE SURGERY     retrieval    SOCIAL HISTORY Social History   Tobacco Use   Smoking status: Former    Types: Cigarettes   Smokeless tobacco: Never  Substance Use Topics   Alcohol use: Yes    Alcohol/week: 1.0 standard drink of alcohol    Types: 1 Cans of beer per week    Social History   Social History Narrative   Not on file    SOCIAL DRIVERS OF HEALTH SDOH Screenings   Food Insecurity: No Food Insecurity (08/10/2024)  Housing: Low Risk  (08/10/2024)  Transportation Needs: No Transportation  Needs (08/10/2024)  Utilities: Not At Risk (08/10/2024)  Depression (PHQ2-9): Medium Risk (08/10/2024)  Tobacco Use: Medium Risk (08/03/2024)     FAMILY HISTORY Family History  Problem Relation Age of Onset   Stroke Brother    Congestive Heart Failure Brother      ALLERGIES: is allergic to lactose intolerance (gi).  MEDICATIONS  Current Outpatient Medications  Medication Sig Dispense Refill   acetaminophen  (TYLENOL ) 325 MG tablet Take 2 tablets (650 mg total) by mouth every 6 (six) hours as needed for mild pain (pain score 1-3) (or Fever >/= 101). 30 tablet 0   alfuzosin  (UROXATRAL ) 10 MG 24 hr tablet Take 10 mg by mouth at bedtime.     B Complex Vitamins (B COMPLEX PO) Take 1 tablet by mouth daily.     Cholecalciferol  (VITAMIN D3) 50 MCG (2000 UT) TABS Take 2,000 Units by mouth daily.     [Paused] Deferasirox  (JADENU ) 180 MG TABS Take 2 tablets (360 mg total) by mouth daily. Take on an empty stomach. 60 tablet 2   docusate sodium  (COLACE) 100 MG capsule Take 100 mg by mouth daily.     fish oil-omega-3 fatty acids  1000 MG capsule Take 1 g by mouth daily.     furosemide  (LASIX ) 20 MG tablet Take 1 tablet by mouth once daily 30 tablet 0   GLUCOSAMINE  CHONDROITIN MSM PO Take 1 tablet by mouth daily. (Patient not taking: Reported on 08/03/2024)     momelotinib dihydrochloride (OJJAARA) 100 MG tablet Take 1 tablet (100 mg total) by mouth daily. 30 tablet 1   Multiple Vitamin (MULTIVITAMIN) tablet Take 1 tablet by mouth daily with breakfast.     potassium chloride  SA (KLOR-CON  M) 20 MEQ tablet Take 1 tablet by mouth twice daily (Patient taking differently: Take 20 mEq by mouth daily.) 30 tablet 0   Turmeric (QC TUMERIC COMPLEX PO) Take 750 mg by mouth daily.     valACYclovir  (VALTREX ) 500 MG tablet Take 1 tablet (500 mg total) by mouth 2 (two) times daily. 60 tablet 5   vitamin C  (ASCORBIC ACID ) 250 MG tablet Take 500 mg by mouth daily.     No current facility-administered medications for this visit.    PHYSICAL EXAMINATION: ECOG PERFORMANCE STATUS: 2 - Symptomatic, <50% confined to bed VITALS: Vitals:   08/10/24 0837 08/10/24 0838  BP: (!) 97/55   Pulse: 94   Temp:  (!) 96.8 F (36 C)  SpO2: 95%    Filed Weights   08/10/24 0838  Weight: 162 lb (73.5 kg)   Body mass index is 25.37 kg/m.  GENERAL: alert, in no acute distress and comfortable SKIN: no acute rashes, no significant lesions EYES: conjunctiva are pink and non-injected, sclera anicteric OROPHARYNX: MMM, no exudates, no oropharyngeal erythema or ulceration NECK: supple, no JVD LYMPH:  no palpable lymphadenopathy in the cervical, axillary or inguinal regions LUNGS: clear to auscultation b/l with normal respiratory effort HEART: regular rate & rhythm ABDOMEN:  normoactive bowel sounds , non tender, (+) distended, no hepatosplenomegaly Extremity: no pedal edema PSYCH: alert & oriented x 3 with fluent speech NEURO: no focal motor/sensory deficits  LABORATORY DATA:   I have reviewed the data as listed     Latest Ref Rng & Units 08/10/2024    7:33 AM 08/06/2024   12:50 PM 08/04/2024    4:55 AM  CBC EXTENDED  WBC 4.0 - 10.5 K/uL 11.2  8.0  7.1   RBC 4.22 - 5.81  MIL/uL 1.74  2.00  2.15   Hemoglobin 13.0 - 17.0 g/dL 5.9  6.5  6.7   HCT 60.9 - 52.0 % 17.5  19.5  21.1   Platelets 150 - 400 K/uL 47  54  67   NEUT# 1.7 - 7.7 K/uL 4.7  3.8    Lymph# 0.7 - 4.0 K/uL 1.7  1.0     CBC w/ Differential:  Lab Results  Component Value Date   WBC 11.2 (H) 08/10/2024   RBC 1.74 (L) 08/10/2024   HGB 5.9 (LL) 08/10/2024   HCT 17.5 (L) 08/10/2024   PLT 47 (L) 08/10/2024   MCV 100.6 (H) 08/10/2024   MCH 33.9 08/10/2024   MCHC 33.7 08/10/2024   RDW 22.9 (H) 08/10/2024   LYMPHSABS 1.7 08/10/2024   MONOABS 3.2 (H) 08/10/2024   BASOSABS 0.3 (H) 08/10/2024    Lab Results  Component Value Date   MONOABS 3.2 (H) 08/10/2024   MONOABS 2.2 (H) 08/06/2024   MONOABS 1.4 (H) 08/03/2024   MONOABS 1.8 (H) 08/03/2024   MONOABS 1.5 (H) 08/02/2024      Latest Ref Rng & Units 08/04/2024    4:55 AM 08/03/2024    2:44 PM 08/03/2024    8:58 AM  CMP  Glucose 70 - 99 mg/dL 896  795  838   BUN 8 - 23 mg/dL 23  26  25    Creatinine 0.61 - 1.24 mg/dL 9.12  9.07  8.99   Sodium 135 - 145 mmol/L 136  133  133   Potassium 3.5 - 5.1 mmol/L 4.3  4.4  4.3   Chloride 98 - 111 mmol/L 103  99  100   CO2 22 - 32 mmol/L 26  25  28    Calcium  8.9 - 10.3 mg/dL 8.1  8.2  8.8   Total Protein 6.5 - 8.1 g/dL 4.7  5.0  5.5   Total Bilirubin 0.0 - 1.2 mg/dL 2.5  1.9  2.5   Alkaline Phos 38 - 126 U/L 164  160  159   AST 15 - 41 U/L 22  24  22    ALT 0 - 44 U/L 70  78  72    02/23/2021 BCR ABL     02/23/2021 JAK2    Surgical Pathology  CASE: [id number]  PATIENT: Fernando Lee  Bone Marrow Report  Clinical History: MPN with progressive anemia  (BH)  DIAGNOSIS:   BONE MARROW, ASPIRATE, CLOT, CORE:  -Hypercellular bone marrow with features of myeloid neoplasm  -See comment   PERIPHERAL BLOOD:  -Macrocytic anemia  -Leukocytosis  -Thrombocytosis   COMMENT:   The bone marrow/peripheral blood show persistent involvement by  previously known myeloid neoplasm.  There is a  myeloproliferative  component as supported by previous JAK2 positivity.  However, there are  also dyspoietic changes primarily involving the megakaryocytic cell line  with numerous hypolobated/unilobated forms in addition to  dysgranulopoiesis to a lesser extent.  This is associated with  eosinophilia.  It is not entirely clear whether the overall findings  represent a myeloproliferative neoplasm with treatment related changes  or represent a primary myeloproliferative/myelodysplastic neoplasm  including but not limited to myeloid neoplasms with eosinophilia.  Correlation with cytogenetic and FISH studies strongly recommended.        RADIOGRAPHIC STUDIES: I have personally reviewed the radiological images as listed and agreed with the findings in the report. IR IMAGING GUIDED PORT INSERTION   Result Date: 09/14/2022 INDICATION: Poor IV access.  Myelodysplastic syndrome EXAM: IMPLANTED PORT A CATH PLACEMENT WITH ULTRASOUND  AND FLUOROSCOPIC GUIDANCE MEDICATIONS: None ANESTHESIA/SEDATION: Moderate (conscious) sedation was employed during this procedure. A total of Versed  3 mg and Fentanyl  100 mcg was administered intravenously. Moderate Sedation Time: 20 minutes. The patient's level of consciousness and vital signs were monitored continuously by radiology nursing throughout the procedure under my direct supervision. FLUOROSCOPY TIME:  Fluoroscopic dose; 1 mGy COMPLICATIONS: None immediate. PROCEDURE: The procedure, risks, benefits, and alternatives were explained to the patient. Questions regarding the procedure were encouraged and answered. The patient understands and consents to the procedure. The RIGHT neck and chest were prepped with chlorhexidine  in a sterile fashion, and a sterile drape was applied covering the operative field. Maximum barrier sterile technique with sterile gowns and gloves were used for the procedure. A timeout was performed prior to the initiation of the procedure. Local  anesthesia was provided with 1% lidocaine  with epinephrine . After creating a small venotomy incision, a micropuncture kit was utilized to access the internal jugular vein under direct, real-time ultrasound guidance. Ultrasound image documentation was performed. The microwire was kinked to measure appropriate catheter length. A subcutaneous port pocket was then created along the upper chest wall utilizing a combination of sharp and blunt dissection. The pocket was irrigated with sterile saline. A single lumen ISP power injectable port was chosen for placement. The 8 Fr catheter was tunneled from the port pocket site to the venotomy incision. The port was placed in the pocket. The external catheter was trimmed to appropriate length. At the venotomy, an 8 Fr peel-away sheath was placed over a guidewire under fluoroscopic guidance. The catheter was then placed through the sheath and the sheath was removed. Final catheter positioning was confirmed and documented with a fluoroscopic spot radiograph. The port was accessed with a Huber needle, aspirated and flushed with heparinized saline. The port pocket incision was closed with interrupted 3-0 Vicryl suture then Dermabond was applied, including at the venotomy incision. Dressings were placed. The patient tolerated the procedure well without immediate post procedural complication. IMPRESSION: Successful placement of a RIGHT internal jugular approach power injectable Port-A-Cath. The tip of the catheter is positioned within the proximal RIGHT atrium. The catheter is ready for immediate use. Thom Hall, MD [facility] Radiology Electronically Signed   By: Thom Hall M.D.   On: 09/14/2022 17:18    CT ANGIO GI BLEED   Result Date: 08/31/2022 CLINICAL DATA:  Acute mesenteric ischemia. Abdominal pain. History of myelodysplastic syndrome. EXAM: CTA ABDOMEN AND PELVIS WITHOUT AND WITH CONTRAST TECHNIQUE: Multidetector CT  imaging of the abdomen and pelvis was performed using the standard protocol during bolus administration of intravenous contrast. Multiplanar reconstructed images and MIPs were obtained and reviewed to evaluate the vascular anatomy. RADIATION DOSE REDUCTION: This exam was performed according to the departmental dose-optimization program which includes automated exposure control, adjustment of the mA and/or kV according to patient size and/or use of iterative reconstruction technique. CONTRAST:  OMNIPAQUE  IOHEXOL  350 MG/ML SOLN COMPARISON:  Abdominal ultrasound examination 04/03/2021 FINDINGS: VASCULAR Aorta: Normal caliber abdominal aorta. Scattered atherosclerotic calcifications. No dissection. Celiac: Normal SMA: Normal Renals: Normal IMA: Patent Inflow: Normal Proximal Outflow: Normal Veins: Normal Review of the MIP images confirms the above findings. NON-VASCULAR Lower chest: Small left pleural effusion and bibasilar atelectasis. The heart is normal in size. No pericardial effusion. There is a moderate to large hiatal hernia. Hepatobiliary: No hepatic lesions or intrahepatic biliary dilatation. The gallbladder is unremarkable. No common bile duct dilatation. Pancreas: No mass, inflammation or ductal  dilatation. Spleen: Massive splenomegaly. The spleen measures 20 x 16 x 11 cm. No splenic lesions or splenic infarct. Adrenals/Urinary Tract: The left kidney is displaced medially and inferiorly by the enlarged spleen. No worrisome renal lesions,, hydronephrosis or pyelonephritis. There is a lower pole right renal calculus and there are 2 distal left ureteral calculi more proximal calculus measures 4 mm and the more distal calculus measures 5 mm. I do not see any hydroureter or hydronephrosis. Stomach/Bowel: The stomach, duodenum, small bowel and colon are grossly normal. No inflammatory changes, mass lesions or obstructive findings. Lymphatic: No abdominal or pelvic lymphadenopathy. Reproductive: The prostate  gland is enlarged. The seminal vesicles are unremarkable. Other: No pelvic mass or adenopathy. No free pelvic fluid collections. No inguinal mass or adenopathy. No abdominal wall hernia or subcutaneous lesions. Musculoskeletal: No significant bony findings. IMPRESSION: 1. Normal caliber abdominal aorta and no dissection. The branch vessels are normal. 2. Splenomegaly. 3. Two distal left ureteral calculi but no hydroureter or hydronephrosis. 4. Lower pole right renal calculus. 5. Moderate to large hiatal hernia. 6. Small left pleural effusion and bibasilar atelectasis. Electronically Signed   By: MYRTIS Stammer M.D.   On: 08/31/2022 18:13    ASSESSMENT & PLAN:  78 y.o. 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  -Mild leukocytosis.  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 Has grade 1 out of 3 reticulin fibrosis.  Uncertain if this is primary or secondary. -LDH has remained within normal limits. 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   #2 Massive splenomegaly.  Multifactorial but additional component of splenic sequestration. Patient does have some baseline splenomegaly related to his MDS/MPN with myelofibrosis and has been on JAK2 elevators to help with this. There is now some increasing splenomegaly potentially due to disease progression but some of the acute increase alongside minimal bumps from his transfusion fairly consistent with additional splenic sequestration. CT abdomen done is concerning for portal hypertension and possibly early liver cirrhosis which is also related to splenomegaly.   #3 abnormal liver function tests.  Likely multifactorial. Cardiac congestion from diastolic CHF.  Does have elevated proBNP levels. Could also be from transfusional iron  overload.  Though ferritin has improved from 5800 down to  3700 with iron  chelation therapy. Jadenu  because liver function abnormalities.   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  No overt evidence of bleeding.   #5 diastolic heart failure grade 2   #6 factorial fatigue   PLAN: - Discussed lab results on 08/10/2024 in detail with patient: CBC showed WBC of 11.2K increased from 8.0K, Hemoglobin 5.9 decreased from 6.5, PLTs of 47K decreased from 54K, and monocytes of 3.2 increased from 2.2.   Persisting splenic sequestration.   - Discussed goals of care in detail.  We discussed that ongoing transfusion support might be more challenging in the context of splenic sequestration.  We discussed there is no easy solutions for management of his significant splenomegaly and that there are several factors that could contribute to his liver dysfunction.  - Informed of the potential options for progression of treatment.  He did start Ojjaara yesterday, but this can take weeks to months for improvement to be noted.   I'm unsure if splenectomy /splenic radiation would improve his condition at this point and might worsen his cytopenias and liver dysfunction. At this point we would be providing palliative treatment, not curative in the context of worsening  blood counts and splenomegaly . -Discussed and patient and wife are agreeable with hospitalization at this time for continued goals of care discussion and most importantly to address his symptomatic anemia and fluid overload. - Hematology/oncology will continue to follow at the hospital - Jadenu  currently on hold but will continue if liver functions remain stable. - Would recommend continuing Ojjaara while hospitalized. - Diuretics management per hospital medicine.  FOLLOW-UP transferred to ED.  The total time spent in the appointment was 40 minutes* .  All of the patient's questions were answered and the patient knows to call the clinic with any problems, questions, or concerns.  Emaline Saran MD  MS AAHIVMS [facility] Hematology/Oncology Physician [facility]  *Total Encounter Time as defined by the Centers for Medicare and Medicaid Services includes, in addition to the face-to-face time of a patient visit (documented in the note above) non-face-to-face time: obtaining and reviewing outside history, ordering and reviewing medications, tests or procedures, care coordination (communications with other health care professionals or caregivers) and documentation in the medical record.  I,Emily Lagle,acting as a neurosurgeon for Emaline Saran, MD.,have documented all relevant documentation on the behalf of Emaline Saran, MD,as directed by  Emaline Saran, MD while in the presence of Emaline Saran, MD.  I have reviewed the above documentation for accuracy and completeness, and I agree with the above.  Tarshia Kot, MD

## 2024-08-11 ENCOUNTER — Encounter: Payer: Self-pay | Admitting: Hematology

## 2024-08-11 DIAGNOSIS — Z79899 Other long term (current) drug therapy: Secondary | ICD-10-CM | POA: Diagnosis not present

## 2024-08-11 DIAGNOSIS — Z66 Do not resuscitate: Secondary | ICD-10-CM | POA: Diagnosis present

## 2024-08-11 DIAGNOSIS — D696 Thrombocytopenia, unspecified: Secondary | ICD-10-CM | POA: Diagnosis present

## 2024-08-11 DIAGNOSIS — D464 Refractory anemia, unspecified: Secondary | ICD-10-CM | POA: Diagnosis present

## 2024-08-11 DIAGNOSIS — Z1589 Genetic susceptibility to other disease: Secondary | ICD-10-CM | POA: Diagnosis not present

## 2024-08-11 DIAGNOSIS — D471 Chronic myeloproliferative disease: Secondary | ICD-10-CM | POA: Diagnosis present

## 2024-08-11 DIAGNOSIS — D649 Anemia, unspecified: Secondary | ICD-10-CM | POA: Diagnosis not present

## 2024-08-11 DIAGNOSIS — D735 Infarction of spleen: Secondary | ICD-10-CM | POA: Insufficient documentation

## 2024-08-11 DIAGNOSIS — Z8249 Family history of ischemic heart disease and other diseases of the circulatory system: Secondary | ICD-10-CM | POA: Diagnosis not present

## 2024-08-11 DIAGNOSIS — Z823 Family history of stroke: Secondary | ICD-10-CM | POA: Diagnosis not present

## 2024-08-11 DIAGNOSIS — Z515 Encounter for palliative care: Secondary | ICD-10-CM | POA: Diagnosis not present

## 2024-08-11 DIAGNOSIS — Z87891 Personal history of nicotine dependence: Secondary | ICD-10-CM | POA: Diagnosis not present

## 2024-08-11 DIAGNOSIS — R7303 Prediabetes: Secondary | ICD-10-CM | POA: Diagnosis present

## 2024-08-11 DIAGNOSIS — E739 Lactose intolerance, unspecified: Secondary | ICD-10-CM | POA: Diagnosis present

## 2024-08-11 DIAGNOSIS — Z7189 Other specified counseling: Secondary | ICD-10-CM

## 2024-08-11 DIAGNOSIS — D84821 Immunodeficiency due to drugs: Secondary | ICD-10-CM | POA: Diagnosis present

## 2024-08-11 DIAGNOSIS — K219 Gastro-esophageal reflux disease without esophagitis: Secondary | ICD-10-CM | POA: Diagnosis present

## 2024-08-11 DIAGNOSIS — D75839 Thrombocytosis, unspecified: Secondary | ICD-10-CM | POA: Diagnosis present

## 2024-08-11 DIAGNOSIS — E782 Mixed hyperlipidemia: Secondary | ICD-10-CM | POA: Diagnosis present

## 2024-08-11 DIAGNOSIS — R161 Splenomegaly, not elsewhere classified: Secondary | ICD-10-CM | POA: Diagnosis not present

## 2024-08-11 DIAGNOSIS — D630 Anemia in neoplastic disease: Secondary | ICD-10-CM | POA: Diagnosis present

## 2024-08-11 DIAGNOSIS — D469 Myelodysplastic syndrome, unspecified: Secondary | ICD-10-CM

## 2024-08-11 DIAGNOSIS — I5032 Chronic diastolic (congestive) heart failure: Secondary | ICD-10-CM | POA: Diagnosis present

## 2024-08-11 DIAGNOSIS — Z860100 Personal history of colon polyps, unspecified: Secondary | ICD-10-CM | POA: Diagnosis not present

## 2024-08-11 DIAGNOSIS — R162 Hepatomegaly with splenomegaly, not elsewhere classified: Secondary | ICD-10-CM | POA: Diagnosis present

## 2024-08-11 DIAGNOSIS — D6189 Other specified aplastic anemias and other bone marrow failure syndromes: Secondary | ICD-10-CM | POA: Diagnosis not present

## 2024-08-11 LAB — CBC
HCT: 23.8 % — ABNORMAL LOW (ref 39.0–52.0)
Hemoglobin: 7.8 g/dL — ABNORMAL LOW (ref 13.0–17.0)
MCH: 33.2 pg (ref 26.0–34.0)
MCHC: 32.8 g/dL (ref 30.0–36.0)
MCV: 101.3 fL — ABNORMAL HIGH (ref 80.0–100.0)
Platelets: 56 K/uL — ABNORMAL LOW (ref 150–400)
RBC: 2.35 MIL/uL — ABNORMAL LOW (ref 4.22–5.81)
RDW: 20.3 % — ABNORMAL HIGH (ref 11.5–15.5)
WBC: 10.2 K/uL (ref 4.0–10.5)
nRBC: 0.2 % (ref 0.0–0.2)

## 2024-08-11 LAB — BASIC METABOLIC PANEL WITH GFR
Anion gap: 9 (ref 5–15)
BUN: 33 mg/dL — ABNORMAL HIGH (ref 8–23)
CO2: 26 mmol/L (ref 22–32)
Calcium: 8.5 mg/dL — ABNORMAL LOW (ref 8.9–10.3)
Chloride: 102 mmol/L (ref 98–111)
Creatinine, Ser: 1.09 mg/dL (ref 0.61–1.24)
GFR, Estimated: >60 mL/min (ref >60)
Glucose, Bld: 155 mg/dL — ABNORMAL HIGH (ref 70–99)
Potassium: 4.5 mmol/L (ref 3.5–5.1)
Sodium: 137 mmol/L (ref 135–145)

## 2024-08-11 MED ORDER — FUROSEMIDE 10 MG/ML IJ SOLN
40.0000 mg | Freq: Once | INTRAMUSCULAR | Status: AC
  Administered 2024-08-11: 40 mg via INTRAVENOUS
  Filled 2024-08-11: qty 4

## 2024-08-11 MED ORDER — FUROSEMIDE 20 MG PO TABS
20.0000 mg | ORAL_TABLET | Freq: Every day | ORAL | Status: DC
  Administered 2024-08-12 – 2024-08-13 (×2): 20 mg via ORAL
  Filled 2024-08-11 (×2): qty 1

## 2024-08-11 NOTE — Progress Notes (Signed)
 1 unit of PRBC transfused by this RN.  1 unit PRBC transfused in ED.  ED did not release order for PRBC  2 units total per MD.

## 2024-08-11 NOTE — Progress Notes (Signed)
 Patient brought in 3 home meds that need to be added to [facility]-Ojjaara, valtrex , and alfuzosin . Pharmacy made aware and stated MD must place orders. MD Odell Castor notified. Family and patient updated.

## 2024-08-11 NOTE — Progress Notes (Addendum)
 [facility] PROGRESS NOTE    Progress Note  Fernando Lee  FMW:[id number] DOB: 05-20-1946 DOA: 08/10/2024 PCP: Dayna Motto, DO     Brief Narrative:   Fernando Lee is an 78 y.o. male past medical history significant for JAK2 poss MDS, splenomegaly, HFpEF comes in with acute on chronic symptomatic anemia, last unit of packed red blood cells was in 08/07/2024, was complaining of fatigue, hemoglobin was 5.9 recheck in the ED 5.3.   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 Hemoglobin this morning 7.8. Will give IV Lasix  Place TED hose. Discontinue telemetry transfer to MedSurg.  Incidental splenic infarct/splenomegaly: Asymptomatic.  Hepatomegaly: Multifactorial.  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  Chronic diastolic dysfunction: Was given over 2 dose of Lasix  after blood transfusions. Resume home dose of Lasix .  Goals of care/ethics: Consulted palliative care.  Patient tells me that Dr. Onesimo related that his medical condition is refractory to treatment. And he recommended moving towards hospice care Transferred to MedSurg.   DVT prophylaxis: scd Family Communication:none Status is: Observation The patient remains OBS appropriate and will d/c before 2 midnights.    Code Status:     Code Status Orders  (From admission, onward)           Start     Ordered   08/10/24 1341  Do not attempt resuscitation (DNR)- Limited -Do Not Intubate (DNI)  Continuous       Question Answer Comment  If pulseless and not breathing No CPR or chest compressions.   In Pre-Arrest Conditions (Patient Is Breathing and Has A Pulse) Do not intubate. Provide all appropriate non-invasive medical interventions. Avoid ICU transfer unless indicated or required.   Consent: Discussion documented in EHR or advanced directives reviewed      08/10/24 1341            Code Status History     Date Active Date Inactive Code Status Order ID Comments User Context   08/03/2024 1747 08/04/2024 1929 Full Code [id number]  Celinda Alm Lot, MD ED   09/16/2023 1352 09/21/2023 0040 Full Code [id number]  Celinda Alm Lot, MD ED   08/31/2022 2013 09/01/2022 2046 Full Code [id number]  Lonzell Emeline HERO, DO ED         IV Access:   Peripheral IV   Procedures and diagnostic studies:   CT ABDOMEN PELVIS W CONTRAST Result Date: 08/10/2024 EXAM: CT ABDOMEN AND PELVIS WITH CONTRAST 08/10/2024 11:51:11 AM TECHNIQUE: CT of the abdomen and pelvis was performed with the administration of 100 mL of iohexol  (OMNIPAQUE ) 300 MG/ML solution. Multiplanar reformatted images are provided for review. Automated exposure control, iterative reconstruction, and/or weight-based adjustment of the mA/kV was utilized to reduce the radiation dose to as low as reasonably achievable. COMPARISON: 08/03/2024 CLINICAL HISTORY: Abdominal pain, acute, nonlocalized; Striae of MDS with prior splenic sequestration, eval for worsening splenomegaly, hepatomegaly. FINDINGS: LOWER CHEST: Bibasilar compressive atelectasis. Moderate bilateral pleural effusions. LIVER: Unchanged hepatomegaly. GALLBLADDER AND BILE DUCTS: Cholelithiasis. No biliary ductal dilatation. SPLEEN: Stable marked splenomegaly measuring 21.2 cm in the coronal plane. New wedge-shaped hypodensity in lateral spleen measuring 5.9 x 2.6 cm, suggesting early splenic infarct. PANCREAS: No acute abnormality. ADRENAL GLANDS: No acute abnormality. KIDNEYS, URETERS AND BLADDER: Nonobstructive right renal calculi up to 4 mm. Unchanged 8 mm stone in distal left ureter, 1 cm proximal to ureterovesicular junction seen on axial image  80 series 2. Unchanged 8 mm bladder stone. No hydronephrosis. No perinephric or periureteral stranding. GI AND BOWEL: Small hiatal hernia. Stomach demonstrates no acute abnormality. There is no bowel obstruction.  PERITONEUM AND RETROPERITONEUM: Stable small volume ascites. No free air. VASCULATURE: Aorta is normal in caliber. Aortic atherosclerosis. Portal venous hypertension with large splenic, gastric, and paraesophageal varices. LYMPH NODES: No lymphadenopathy. REPRODUCTIVE ORGANS: Enlarged prostate. BONES AND SOFT TISSUES: Multilevel spine degenerative changes. No acute osseous abnormality. No focal soft tissue abnormality. IMPRESSION: 1. New wedge-shaped hypodensity in the lateral spleen compatible with early splenic infarct, on a background of marked splenomegaly. 2. Unchanged 8 mm distal left ureteral stone approximately 1 cm proximal to the ureterovesical junction, with additional nonobstructive right renal calculi up to 4 mm. 3. Unchanged 8 mm bladder stone. 4. Unchanged hepatomegaly with evidence of portal venous hypertension. 5. Moderate bilateral pleural effusions with bibasilar compressive atelectasis. 6. Aortic atherosclerosis. Electronically signed by: Ryan Chess MD 08/10/2024 12:26 PM EDT RP Workstation: [id number]     Medical Consultants:   None.   Subjective:    Fernando Lee feels better than yesterday.  Objective:    Vitals:   08/10/24 2336 08/10/24 2339 08/11/24 0213 08/11/24 0606  BP:  (!) 103/59 108/64 105/60  Pulse:  76 77 73  Resp:  18  18  Temp:  98.1 F (36.7 C) 98.3 F (36.8 C) 97.8 F (36.6 C)  TempSrc: Oral Oral Oral Oral  SpO2:  92% 95% 95%  Weight: 73.2 kg     Height: 5' 7 (1.702 m)      SpO2: 95 % O2 Flow Rate (L/min): 2 L/min   Intake/Output Summary (Last 24 hours) at 08/11/2024 0756 Last data filed at 08/11/2024 0530 Gross per 24 hour  Intake 608 ml  Output 350 ml  Net 258 ml   Filed Weights   08/10/24 2336  Weight: 73.2 kg    Exam: General exam: In no acute distress. Respiratory system: Good air movement and clear to auscultation. Cardiovascular system: S1 & S2 heard, RRR. No JVD. Gastrointestinal system: Abdomen is nondistended, soft  and nontender.  Extremities: No pedal edema. Skin: No rashes, lesions or ulcers Psychiatry: Judgement and insight appear normal. Mood & affect appropriate.    Data Reviewed:    Labs: Basic Metabolic Panel: Recent Labs  Lab 08/10/24 1019 08/11/24 0547  NA 135 137  K 4.6 4.5  CL 101 102  CO2 26 26  GLUCOSE 172* 155*  BUN 37* 33*  CREATININE 1.21 1.09  CALCIUM  8.5* 8.5*   GFR Estimated Creatinine Clearance: 50.5 mL/min (by C-G formula based on SCr of 1.09 mg/dL). Liver Function Tests: Recent Labs  Lab 08/10/24 1019  AST 21  ALT 76*  ALKPHOS 179*  BILITOT 2.1*  PROT 5.1*  ALBUMIN 3.6   No results for input(s): LIPASE, AMYLASE in the last 168 hours. No results for input(s): AMMONIA in the last 168 hours. Coagulation profile No results for input(s): INR, PROTIME in the last 168 hours. COVID-19 Labs  No results for input(s): DDIMER, FERRITIN, LDH, CRP in the last 72 hours.  No results found for: SARSCOV2NAA  CBC: Recent Labs  Lab 08/06/24 1250 08/10/24 0733 08/10/24 1019 08/10/24 1907 08/11/24 0547  WBC 8.0 11.2* 10.6*  --  10.2  NEUTROABS 3.8 4.7 5.5  --   --   HGB 6.5* 5.9* 5.3* 6.2* 7.8*  HCT 19.5* 17.5* 17.4* 19.9* 23.8*  MCV 97.5 100.6* 104.8*  --  101.3*  PLT 54*  47* 55*  --  56*   Cardiac Enzymes: No results for input(s): CKTOTAL, CKMB, CKMBINDEX, TROPONINI in the last 168 hours. BNP (last 3 results) Recent Labs    08/03/24 1444 08/10/24 1019  PROBNP 481.0* 613.0*   CBG: No results for input(s): GLUCAP in the last 168 hours. D-Dimer: No results for input(s): DDIMER in the last 72 hours. Hgb A1c: No results for input(s): HGBA1C in the last 72 hours. Lipid Profile: No results for input(s): CHOL, HDL, LDLCALC, TRIG, CHOLHDL, LDLDIRECT in the last 72 hours. Thyroid  function studies: No results for input(s): TSH, T4TOTAL, T3FREE, THYROIDAB in the last 72 hours.  Invalid input(s):  FREET3 Anemia work up: No results for input(s): VITAMINB12, FOLATE, FERRITIN, TIBC, IRON , RETICCTPCT in the last 72 hours. Sepsis Labs: Recent Labs  Lab 08/06/24 1250 08/10/24 0733 08/10/24 1019 08/11/24 0547  WBC 8.0 11.2* 10.6* 10.2   Microbiology No results found for this or any previous visit (from the past 240 hours).   Medications:    Chlorhexidine  Gluconate Cloth  6 each Topical Daily   furosemide   20 mg Oral Daily   sodium chloride  flush  10-40 mL Intracatheter Q12H   Continuous Infusions:    LOS: 0 days   Erle Odell Castor  [facility]  08/11/2024, 7:56 AM

## 2024-08-11 NOTE — Progress Notes (Signed)
 HEMATOLOGY ONCOLOGY INPATIENT PROGRESS NOTE  Date of service: .08/11/2024   Patient Care Team: Dayna Motto, DO as PCP - General (Family Medicine)   Subjective   Fernando Lee was seen in hematologic/oncologic follow-up and for continued goals of care discussion. He was admitted from clinic yesterday for symptomatic anemia and volume overload with increasing shortness of breath. He has been transfused 2 units of PRBCs with improvement in his hemoglobin to 7.8. He has not noted any increased abdominal pain or distention with this and appears to have improved handing off transfusion without as much splenic sequestration.  He did have a CT of the abdomen again in the emergency room yesterday which showed a new wedge-shaped hypodensity in the lateral spleen compatible with a splenic infarction. He has also been diuresed and notes decreased leg swelling and improved shortness of breath count of correcting the anemia and his fluid status. He notes that he has been able to get up from the bed and go to the bathroom better. He notes that he still has leg swelling but as that goes down he is feeling better.  He is having to pee a lot as expected with his diuresis.  We had a detailed goals of care discussion with the patient and his wife at bedside. No chest pain or overt shortness of breath at rest currently.  REVIEW OF SYSTEMS:   .10 Point review of Systems was done is negative except as noted above.   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   Kidney stones    Male erectile dysfunction, unspecified    Mixed dyslipidemia     Mixed hyperlipidemia    Ocular migraine    PAC (premature atrial contraction)    Personal history of colonic polyps    Prediabetes    Residual hemorrhoidal skin tags    Spleen enlarged    nov 2023 hospitalized   Unspecified hemorrhoids     SURGICAL HISTORY Past Surgical History:  Procedure Laterality Date   BONE MARROW BIOPSY     IR IMAGING GUIDED PORT INSERTION  09/14/2022   KIDNEY STONE SURGERY     retrieval    SOCIAL HISTORY Social History   Tobacco Use   Smoking status: Former    Types: Cigarettes   Smokeless tobacco: Never  Substance Use Topics   Alcohol use: Yes    Alcohol/week: 1.0 standard drink of alcohol    Types: 1 Cans of beer per week    Social History   Social History Narrative   Not on file    SOCIAL DRIVERS OF HEALTH SDOH Screenings   Food Insecurity: No Food Insecurity (08/10/2024)  Housing: Low Risk  (08/10/2024)  Transportation Needs: No Transportation Needs (08/10/2024)  Utilities: Not At Risk (08/10/2024)  Depression (PHQ2-9): Medium Risk (08/10/2024)  Social Connections: Patient Declined (08/10/2024)  Tobacco Use: Medium Risk (08/10/2024)     FAMILY HISTORY Family History  Problem Relation Age of Onset   Stroke Brother    Congestive Heart Failure Brother  ALLERGIES: is allergic to lactose intolerance (gi).  MEDICATIONS  Current Facility-Administered Medications  Medication Dose Route Frequency Provider Last Rate Last Admin   acetaminophen  (TYLENOL ) tablet 650 mg  650 mg Oral Q6H PRN Zella, Mir M, MD       Or   acetaminophen  (TYLENOL ) suppository 650 mg  650 mg Rectal Q6H PRN Zella, Mir M, MD       albuterol  (PROVENTIL ) (2.5 MG/3ML) 0.083% nebulizer solution 2.5 mg  2.5 mg Nebulization Q2H PRN Zella, Mir M, MD       Chlorhexidine  Gluconate Cloth 2 % PADS 6 each  6 each Topical Daily Zella, Mir M, MD   6 each at 08/11/24 0905   [START ON 08/12/2024] furosemide  (LASIX ) tablet 20 mg  20 mg Oral Daily  Odell Celinda Balo, MD       ondansetron  (ZOFRAN ) tablet 4 mg  4 mg Oral Q6H PRN Zella, Mir M, MD       Or   ondansetron  (ZOFRAN ) injection 4 mg  4 mg Intravenous Q6H PRN Zella, Mir M, MD       sodium chloride  flush (NS) 0.9 % injection 10-40 mL  10-40 mL Intracatheter PRN Zella, Mir M, MD       sodium chloride  flush (NS) 0.9 % injection 10-40 mL  10-40 mL Intracatheter Q12H Zella, Mir M, MD   10 mL at 08/10/24 2301   sodium chloride  flush (NS) 0.9 % injection 10-40 mL  10-40 mL Intracatheter PRN Zella, Mir M, MD       traZODone (DESYREL) tablet 25 mg  25 mg Oral QHS PRN Zella, Mir M, MD        PHYSICAL EXAMINATION: ECOG PERFORMANCE STATUS: 2 - Symptomatic, <50% confined to bed VITALS: Vitals:   08/11/24 0910 08/11/24 1313  BP: 109/61 112/71  Pulse: 80 77  Resp:    Temp:  98.4 F (36.9 C)  SpO2:  97%   Filed Weights   08/10/24 2336  Weight: 161 lb 6 oz (73.2 kg)   Body mass index is 25.28 kg/m. [name]:alert, in no acute distress and comfortable SKIN: no acute rashes, no significant lesions EYES: conjunctiva are pink and non-injected, sclera anicteric OROPHARYNX: MMM, no exudates, no oropharyngeal erythema or ulceration NECK: supple, no JVD LYMPH:  no palpable lymphadenopathy in the cervical, axillary or inguinal regions LUNGS: Few basal rales bilaterally HEART: regular rate & rhythm ABDOMEN:  normoactive bowel sounds , non tender, nondistended with palpable splenomegaly about 6 inches below left costal margin while supine Extremity: 2+ bilateral pitting pedal edema PSYCH: alert & oriented x 3 with fluent speech NEURO: no focal motor/sensory deficits  LABORATORY DATA:   I have reviewed the data as listed     Latest Ref Rng & Units 08/11/2024    5:47 AM 08/10/2024    7:07 PM 08/10/2024   10:19 AM  CBC EXTENDED  WBC 4.0 - 10.5 K/uL 10.2   10.6   RBC 4.22 - 5.81 MIL/uL 2.35   1.66   Hemoglobin 13.0 - 17.0 g/dL 7.8  6.2  5.3   HCT  60.9 - 52.0 % 23.8  19.9  17.4   Platelets 150 - 400 K/uL 56   55   NEUT# 1.7 - 7.7 K/uL   5.5   Lymph# 0.7 - 4.0 K/uL   2.1    CBC w/ Differential:  Lab Results  Component Value Date   WBC 10.2 08/11/2024   RBC 2.35 (L) 08/11/2024   HGB 7.8 (L)  08/11/2024   HCT 23.8 (L) 08/11/2024   PLT 56 (L) 08/11/2024   MCV 101.3 (H) 08/11/2024   MCH 33.2 08/11/2024   MCHC 32.8 08/11/2024   RDW 20.3 (H) 08/11/2024   LYMPHSABS 2.1 08/10/2024   MONOABS 1.7 (H) 08/10/2024   BASOSABS 0.0 08/10/2024    Lab Results  Component Value Date   MONOABS 1.7 (H) 08/10/2024   MONOABS 3.2 (H) 08/10/2024   MONOABS 2.2 (H) 08/06/2024   MONOABS 1.4 (H) 08/03/2024   MONOABS 1.8 (H) 08/03/2024      Latest Ref Rng & Units 08/11/2024    5:47 AM 08/10/2024   10:19 AM 08/04/2024    4:55 AM  CMP  Glucose 70 - 99 mg/dL 844  827  896   BUN 8 - 23 mg/dL 33  37  23   Creatinine 0.61 - 1.24 mg/dL 8.90  8.78  9.12   Sodium 135 - 145 mmol/L 137  135  136   Potassium 3.5 - 5.1 mmol/L 4.5  4.6  4.3   Chloride 98 - 111 mmol/L 102  101  103   CO2 22 - 32 mmol/L 26  26  26    Calcium  8.9 - 10.3 mg/dL 8.5  8.5  8.1   Total Protein 6.5 - 8.1 g/dL  5.1  4.7   Total Bilirubin 0.0 - 1.2 mg/dL  2.1  2.5   Alkaline Phos 38 - 126 U/L  179  164   AST 15 - 41 U/L  21  22   ALT 0 - 44 U/L  76  70    02/23/2021 BCR ABL     02/23/2021 JAK2    Surgical Pathology  CASE: [id number]  PATIENT: Fernando Lee  Bone Marrow Report  Clinical History: MPN with progressive anemia  (BH)  DIAGNOSIS:   BONE MARROW, ASPIRATE, CLOT, CORE:  -Hypercellular bone marrow with features of myeloid neoplasm  -See comment   PERIPHERAL BLOOD:  -Macrocytic anemia  -Leukocytosis  -Thrombocytosis   COMMENT:   The bone marrow/peripheral blood show persistent involvement by  previously known myeloid neoplasm.  There is a myeloproliferative  component as supported by previous JAK2 positivity.  However, there are  also dyspoietic changes  primarily involving the megakaryocytic cell line  with numerous hypolobated/unilobated forms in addition to  dysgranulopoiesis to a lesser extent.  This is associated with  eosinophilia.  It is not entirely clear whether the overall findings  represent a myeloproliferative neoplasm with treatment related changes  or represent a primary myeloproliferative/myelodysplastic neoplasm  including but not limited to myeloid neoplasms with eosinophilia.  Correlation with cytogenetic and FISH studies strongly recommended.        RADIOGRAPHIC STUDIES: I have personally reviewed the radiological images as listed and agreed with the findings in the report. IR IMAGING GUIDED PORT INSERTION   Result Date: 09/14/2022 INDICATION: Poor IV access.  Myelodysplastic syndrome EXAM: IMPLANTED PORT A CATH PLACEMENT WITH ULTRASOUND AND FLUOROSCOPIC GUIDANCE MEDICATIONS: None ANESTHESIA/SEDATION: Moderate (conscious) sedation was employed during this procedure. A total of Versed  3 mg and Fentanyl  100 mcg was administered intravenously. Moderate Sedation Time: 20 minutes. The patient's level of consciousness and vital signs were monitored continuously by radiology nursing throughout the procedure under my direct supervision. FLUOROSCOPY TIME:  Fluoroscopic dose; 1 mGy COMPLICATIONS: None immediate. PROCEDURE: The procedure, risks, benefits, and alternatives were explained to the patient. Questions regarding the procedure were encouraged and answered. The patient understands and consents to the procedure. The RIGHT neck and chest  were prepped with chlorhexidine  in a sterile fashion, and a sterile drape was applied covering the operative field. Maximum barrier sterile technique with sterile gowns and gloves were used for the procedure. A timeout was performed prior to the initiation of the procedure. Local anesthesia was provided with 1% lidocaine  with epinephrine . After creating a small venotomy incision, a micropuncture kit  was utilized to access the internal jugular vein under direct, real-time ultrasound guidance. Ultrasound image documentation was performed. The microwire was kinked to measure appropriate catheter length. A subcutaneous port pocket was then created along the upper chest wall utilizing a combination of sharp and blunt dissection. The pocket was irrigated with sterile saline. A single lumen ISP power injectable port was chosen for placement. The 8 Fr catheter was tunneled from the port pocket site to the venotomy incision. The port was placed in the pocket. The external catheter was trimmed to appropriate length. At the venotomy, an 8 Fr peel-away sheath was placed over a guidewire under fluoroscopic guidance. The catheter was then placed through the sheath and the sheath was removed. Final catheter positioning was confirmed and documented with a fluoroscopic spot radiograph. The port was accessed with a Huber needle, aspirated and flushed with heparinized saline. The port pocket incision was closed with interrupted 3-0 Vicryl suture then Dermabond was applied, including at the venotomy incision. Dressings were placed. The patient tolerated the procedure well without immediate post procedural complication. IMPRESSION: Successful placement of a RIGHT internal jugular approach power injectable Port-A-Cath. The tip of the catheter is positioned within the proximal RIGHT atrium. The catheter is ready for immediate use. Thom Hall, MD [facility] Electronically Signed   By: Thom Hall M.D.   On: 09/14/2022 17:18    CT ANGIO GI BLEED   Result Date: 08/31/2022 CLINICAL DATA:  Acute mesenteric ischemia. Abdominal pain. History of myelodysplastic syndrome. EXAM: CTA ABDOMEN AND PELVIS WITHOUT AND WITH CONTRAST TECHNIQUE: Multidetector CT imaging of the abdomen and pelvis was performed using the standard protocol during bolus administration of intravenous  contrast. Multiplanar reconstructed images and MIPs were obtained and reviewed to evaluate the vascular anatomy. RADIATION DOSE REDUCTION: This exam was performed according to the departmental dose-optimization program which includes automated exposure control, adjustment of the mA and/or kV according to patient size and/or use of iterative reconstruction technique. CONTRAST:  OMNIPAQUE  IOHEXOL  350 MG/ML SOLN COMPARISON:  Abdominal ultrasound examination 04/03/2021 FINDINGS: VASCULAR Aorta: Normal caliber abdominal aorta. Scattered atherosclerotic calcifications. No dissection. Celiac: Normal SMA: Normal Renals: Normal IMA: Patent Inflow: Normal Proximal Outflow: Normal Veins: Normal Review of the MIP images confirms the above findings. NON-VASCULAR Lower chest: Small left pleural effusion and bibasilar atelectasis. The heart is normal in size. No pericardial effusion. There is a moderate to large hiatal hernia. Hepatobiliary: No hepatic lesions or intrahepatic biliary dilatation. The gallbladder is unremarkable. No common bile duct dilatation. Pancreas: No mass, inflammation or ductal dilatation. Spleen: Massive splenomegaly. The spleen measures 20 x 16 x 11 cm. No splenic lesions or splenic infarct. Adrenals/Urinary Tract: The left kidney is displaced medially and inferiorly by the enlarged spleen. No worrisome renal lesions,, hydronephrosis or pyelonephritis. There is a lower pole right renal calculus and there are 2 distal left ureteral calculi more proximal calculus measures 4 mm and the more distal calculus measures 5 mm. I do not see any hydroureter or hydronephrosis. Stomach/Bowel: The stomach, duodenum, small bowel and colon are grossly normal. No inflammatory changes, mass lesions or obstructive  findings. Lymphatic: No abdominal or pelvic lymphadenopathy. Reproductive: The prostate gland is enlarged. The seminal vesicles are unremarkable. Other: No pelvic mass or adenopathy. No free pelvic fluid  collections. No inguinal mass or adenopathy. No abdominal wall hernia or subcutaneous lesions. Musculoskeletal: No significant bony findings. IMPRESSION: 1. Normal caliber abdominal aorta and no dissection. The branch vessels are normal. 2. Splenomegaly. 3. Two distal left ureteral calculi but no hydroureter or hydronephrosis. 4. Lower pole right renal calculus. 5. Moderate to large hiatal hernia. 6. Small left pleural effusion and bibasilar atelectasis. Electronically Signed   By: MYRTIS Stammer M.D.   On: 08/31/2022 18:13    ASSESSMENT & PLAN:  78 y.o. 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  -Mild leukocytosis.  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 Has grade 1 out of 3 reticulin fibrosis.  Uncertain if this is primary or secondary. -LDH has remained within normal limits. 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   #2 Massive splenomegaly.  Multifactorial but additional component of splenic sequestration. Patient does have some baseline splenomegaly related to his MDS/MPN with myelofibrosis and has been on JAK2 elevators to help with this. There is now some increasing splenomegaly potentially due to disease progression but some of the acute increase alongside minimal bumps from his transfusion fairly consistent with additional splenic sequestration. CT abdomen done is concerning for portal hypertension and possibly early liver cirrhosis which is also related to splenomegaly. 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  #3 abnormal liver function tests.  Likely multifactorial. Cardiac congestion from diastolic CHF.  Does have elevated proBNP levels. Could also be from transfusional iron  overload.  Though ferritin has improved from 5800 down to 3700 with iron  chelation therapy. Jadenu  because liver function  abnormalities.   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  No overt evidence of bleeding.   #5 diastolic heart failure grade 2   #6 factorial fatigue   PLAN: - Discussed lab results on 08/11/2024 in detail with patient: -CBC shows improvement in his hemoglobin to 7.8 after PRBC transfusion.  Platelets are also up to 56k.  -CT abdomen done 08/10/2024 shows evolving splenic infarction unchanged 8 mm distal left ureteral stone.  Moderate bilateral pleural effusions. - He had left-sided abdominal pain which is improved probably from evolving splenic infarction. - Paradoxically the splenic infarction might actually help some with his splenic sequestration. - He is being effectively diuresed and notes improving shortness of breath. - Would optimize diuresis to help with his fluid overload and may also help decongest his liver. - Transfuse as needed to maintain hemoglobin more than 7.  Would recommend transfusing more slowly over 3 to 4 hours to reduce risk of splenic sequestration. - Hopefully the patient can be diuresed over the weekend be transfused to maintain hemoglobin around 7.5 prior to discharge later on Sunday or Monday. - We will plan to set him up for transfusion support twice weekly starting from later this coming week. -I had a detailed goals of care discussion with the patient and noted that his overall health status is changed from his baseline of needing PRBC transfusions and having a good quality of life to increasing intolerance of transfusion support, increased transfusion needs, splenic sequestration, volume overload and iron  overload related issues. -We discussed option for best supportive care through hospice versus continued supportive care and he chooses the latter for now. -We discussed that he  should take a sharp time to get his affairs in order so that this does not mentally stress him and to meet family members he would like to spend some time with. -Mr. And Mrs.  Lee were very grateful for the discussion. -They would like the idea of seeing palliative care today to have outpatient palliative care services in place so that the team can get to know them when he does decide to transition to hospice cares at home. -At this time he prefers to continue supportive cares and see how he does in the next few weeks and to see if the [place] helps. - Jadenu  currently on hold but will continue if liver functions remain stable. - Would recommend continuing O[place] while hospitalized. - Diuretics management per hospital medicine. - Appreciate excellent hospital medicine cares.  The total time spent in the appointment was 50 minutes*.  All of the patient's questions were answered with apparent satisfaction. The patient knows to call the clinic with any problems, questions or concerns.   Emaline Saran MD MS AAHIVMS [facility] Hematology/Oncology Physician [facility]  .*Total Encounter Time as defined by the Centers for Medicare and Medicaid Services includes, in addition to the face-to-face time of a patient visit (documented in the note above) non-face-to-face time: obtaining and reviewing outside history, ordering and reviewing medications, tests or procedures, care coordination (communications with other health care professionals or caregivers) and documentation in the medical record.

## 2024-08-12 DIAGNOSIS — Z7189 Other specified counseling: Secondary | ICD-10-CM | POA: Diagnosis not present

## 2024-08-12 DIAGNOSIS — D6189 Other specified aplastic anemias and other bone marrow failure syndromes: Secondary | ICD-10-CM

## 2024-08-12 DIAGNOSIS — D469 Myelodysplastic syndrome, unspecified: Secondary | ICD-10-CM | POA: Diagnosis not present

## 2024-08-12 DIAGNOSIS — D735 Infarction of spleen: Secondary | ICD-10-CM | POA: Diagnosis not present

## 2024-08-12 DIAGNOSIS — Z1589 Genetic susceptibility to other disease: Secondary | ICD-10-CM | POA: Diagnosis not present

## 2024-08-12 DIAGNOSIS — Z515 Encounter for palliative care: Secondary | ICD-10-CM

## 2024-08-12 LAB — CBC
HCT: 26.5 % — ABNORMAL LOW (ref 39.0–52.0)
Hemoglobin: 8.4 g/dL — ABNORMAL LOW (ref 13.0–17.0)
MCH: 32.1 pg (ref 26.0–34.0)
MCHC: 31.7 g/dL (ref 30.0–36.0)
MCV: 101.1 fL — ABNORMAL HIGH (ref 80.0–100.0)
Platelets: 47 K/uL — ABNORMAL LOW (ref 150–400)
RBC: 2.62 MIL/uL — ABNORMAL LOW (ref 4.22–5.81)
RDW: 20.9 % — ABNORMAL HIGH (ref 11.5–15.5)
WBC: 12.8 K/uL — ABNORMAL HIGH (ref 4.0–10.5)
nRBC: 0.3 % — ABNORMAL HIGH (ref 0.0–0.2)

## 2024-08-12 LAB — BASIC METABOLIC PANEL WITH GFR
Anion gap: 11 (ref 5–15)
BUN: 31 mg/dL — ABNORMAL HIGH (ref 8–23)
CO2: 26 mmol/L (ref 22–32)
Calcium: 8.8 mg/dL — ABNORMAL LOW (ref 8.9–10.3)
Chloride: 101 mmol/L (ref 98–111)
Creatinine, Ser: 1.13 mg/dL (ref 0.61–1.24)
GFR, Estimated: >60 mL/min (ref >60)
Glucose, Bld: 172 mg/dL — ABNORMAL HIGH (ref 70–99)
Potassium: 3.7 mmol/L (ref 3.5–5.1)
Sodium: 137 mmol/L (ref 135–145)

## 2024-08-12 MED ORDER — FUROSEMIDE 10 MG/ML IJ SOLN
INTRAMUSCULAR | Status: AC
  Filled 2024-08-12: qty 4

## 2024-08-12 MED ORDER — ALFUZOSIN HCL ER 10 MG PO TB24
10.0000 mg | ORAL_TABLET | Freq: Every day | ORAL | Status: DC
  Administered 2024-08-13: 10 mg via ORAL
  Filled 2024-08-12: qty 1

## 2024-08-12 MED ORDER — MOMELOTINIB DIHYDROCHLORIDE 100 MG PO TABS
100.0000 mg | ORAL_TABLET | Freq: Every day | ORAL | Status: DC
  Administered 2024-08-12 – 2024-08-13 (×2): 100 mg via ORAL
  Filled 2024-08-12 (×2): qty 1

## 2024-08-12 MED ORDER — VALACYCLOVIR HCL 500 MG PO TABS
500.0000 mg | ORAL_TABLET | Freq: Two times a day (BID) | ORAL | Status: DC
  Administered 2024-08-12 – 2024-08-13 (×3): 500 mg via ORAL
  Filled 2024-08-12 (×3): qty 1

## 2024-08-12 MED ORDER — FUROSEMIDE 10 MG/ML IJ SOLN
40.0000 mg | Freq: Two times a day (BID) | INTRAMUSCULAR | Status: AC
  Administered 2024-08-12 (×2): 40 mg via INTRAVENOUS
  Filled 2024-08-12 (×2): qty 4

## 2024-08-12 NOTE — Plan of Care (Signed)

## 2024-08-12 NOTE — Plan of Care (Signed)
 Discussed with eyes closed with eyes closed plan of care for the evening, pain management and bedtime medications with some teach back displayed.  Problem: Education: Goal: Knowledge of General Education information will improve Description: Including pain rating scale, medication(s)/side effects and non-pharmacologic comfort measures Outcome: Progressing   Problem: Health Behavior/Discharge Planning: Goal: Ability to manage health-related needs will improve Outcome: Progressing   Problem: Pain Managment: Goal: General experience of comfort will improve and/or be controlled Outcome: Progressing

## 2024-08-12 NOTE — Consult Note (Signed)
 Consultation Note Date: 08/12/2024   Patient Name: Fernando Lee  DOB: July 06, 1946  MRN: [id number]  Age / Sex: 78 y.o., male   PCP: Fernando Motto, DO Referring Physician: Odell Lee, Erle, Fernando Lee  Reason for Consultation: Establishing goals of care     Chief Complaint/History of Present Illness:   Patient is is an 78 year old male with a past medical history of JAK2 positive myeloproliferative neoplasm primarily presenting with thrombocytosis and associated MDS causing refractory anemia, massive splenomegaly multifactorial in origin though with underlying component of splenic sequestration, 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  Hemoglobin found to be 5.9.  During hospitalization patient has received management for symptomatic anemia likely due to MDS with noted splenomegaly on imaging.  Oncology consulted for recommendations.  Palliative medicine team consulted to assist with complex medical decision making.  Reviewed EMR including recent documentation from hospitalist and oncologist.  Fernando Lee noted to have extensive discussion on 08/11/2024 with patient and his wife regarding goals of care.  At this time, planning to continue supportive medical management such as with transfusions in the outpatient setting.  Palliative medicine and hospice were discussed by Fernando Lee.  Patient and wife agreeing with discussions regarding palliative support moving forward. Reviewed recent CBC noting WBC 12.8, hemoglobin 8.4, and platelets 47.  Presented to bedside to see patient.  Patient sitting on side of bed.  Patient able to introduce his wife, Fernando Lee, who was present at bedside.  Introduced myself as a member of the palliative medicine team and my role in patient's medical journey.  Spent time learning about discussions previously had with Fernando Lee.  Patient stated at beginning of conversation that he knows his time is overall growing short.  At this time,  patient's and wife's goal is for patient to continue transfusions in the outpatient setting.  Acknowledging and agreed with supporting this.  Inquiring about palliative support during this process moving forward and wanting to know how to have care in place should patient rapidly deteriorate.SABRA  Spent time explaining inpatient palliative medicine versus home palliative medicine.  Discussed how home palliative medicine referral usually entails a nurse practitioner coming out to patient's home 1-2 times a month to continue conversations moving forward.  They had felt there would be more support provided by that.  Then spent time explaining the differences between home palliative medicine support and home hospice support.  Provided generalities between what palliative care provides versus hospice support.  Also acknowledged that their goal is for patient to continue with appropriate aggressive medical interventions at this time including continuing transfusions.  Discussed with that being patient's goal, agree with palliative support moving forward.  With questions about preparation and what transition to hospice would look like at some point, noted hospice would be providing medications and symptom management when focusing on care at end-of-life and patient no longer receiving transfusions.  Spent time answering questions as able regarding this.  Did acknowledge that usually with hospice support, hospice would be the ones to review patient's medications and continue medications they believe for focused on symptom management.  Wife had multiple questions regarding this.  Answered as able while also acknowledging would be up to individual hospice provider based on patient's criteria for management.  Will note that during conversation wife appeared frustrated when discussing hospice stating that they want to continue with appropriate medicines and that they are hoping patient will improve with current medications.   Acknowledged this and  supported patient continuing appropriate medical interventions and hope he would get continued quality time moving forward.  Answered questions as able.  Encouraged continued conversations with Fernando Lee.  All questions answered at that time.  Noted would involve TOC for home palliative medicine referral.  Discussed care with hospitalist, TOC, bedside RN, and oncologist to coordinate care after visit.  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   Kidney stones    Male erectile dysfunction, unspecified    Mixed dyslipidemia    Mixed hyperlipidemia    Ocular migraine    PAC (premature atrial contraction)    Personal history of colonic polyps    Prediabetes    Residual hemorrhoidal skin tags    Spleen enlarged    nov 2023 hospitalized   Unspecified hemorrhoids    Social History   Socioeconomic History   Marital status: Married    Spouse name: Not on file   Number of children: Not on file   Years of education: Not on file   Highest education level: Not on file  Occupational History   Not on file  Tobacco Use   Smoking status: Former    Types: Cigarettes   Smokeless tobacco: Never  Substance and Sexual Activity   Alcohol use: Yes    Alcohol/week: 1.0 standard drink of alcohol    Types: 1 Cans of beer per week   Drug use: Not on file   Sexual activity: Not on file  Other Topics Concern   Not on file  Social History Narrative   Not on file   Social Drivers of Health   Financial Resource  Strain: Not on file  Food Insecurity: No Food Insecurity (08/10/2024)   Hunger Vital Sign    Worried About Running Out of Food in the Last Year: Never true    Ran Out of Food in the Last Year: Never true  Transportation Needs: No Transportation Needs (08/10/2024)   PRAPARE - Administrator, Civil Service (Medical): No    Lack of Transportation (Non-Medical): No  Physical Activity: Not on file  Stress: Not on file  Social Connections: Patient Declined (08/10/2024)   Social Connection and Isolation Panel    Frequency of Communication with Friends and Family: Patient declined    Frequency of Social Gatherings with Friends and Family: Patient declined    Attends Religious Services: Patient declined    Database Administrator or Organizations: Patient declined    Attends Engineer, Structural: Patient declined    Marital Status: Patient declined   Family History  Problem Relation Age of Onset   Stroke Brother    Congestive Heart Failure Brother    Scheduled Meds:  [START ON 08/13/2024] alfuzosin   10 mg Oral Q breakfast   Chlorhexidine  Gluconate Cloth  6 each Topical Daily   furosemide   40 mg Intravenous Q12H   furosemide   20 mg Oral Daily   momelotinib dihydrochloride  100 mg Oral Daily   sodium chloride  flush  10-40 mL Intracatheter Q12H   valACYclovir   500 mg Oral BID   Continuous Infusions: PRN Meds:.acetaminophen  **OR** acetaminophen , albuterol , ondansetron  **OR** ondansetron  (ZOFRAN ) IV, sodium chloride  flush, sodium chloride  flush, traZODone Allergies  Allergen Reactions   Lactose Intolerance (Gi) Diarrhea   CBC:    Component Value Date/Time   WBC 12.8 (H) 08/12/2024 0821   HGB 8.4 (L) 08/12/2024 0821   HGB 5.9 (LL) 08/10/2024 0733   HCT 26.5 (L) 08/12/2024 0821   HCT 19.8 (L) 12/24/2021 1129   PLT 47 (L) 08/12/2024 0821   PLT 47 (L) 08/10/2024 0733   MCV 101.1 (H) 08/12/2024 0821   NEUTROABS 5.5 08/10/2024 1019   LYMPHSABS 2.1 08/10/2024 1019    MONOABS 1.7 (H) 08/10/2024 1019   EOSABS 1.0 (H) 08/10/2024 1019   BASOSABS 0.0 08/10/2024 1019   Comprehensive Metabolic Panel:    Component Value Date/Time   NA 137 08/11/2024 0547   K 4.5 08/11/2024 0547   CL 102 08/11/2024 0547   CO2 26 08/11/2024 0547   BUN 33 (H) 08/11/2024 0547   CREATININE 1.09 08/11/2024 0547   CREATININE 1.00 08/03/2024 0858   GLUCOSE 155 (H) 08/11/2024 0547   CALCIUM  8.5 (L) 08/11/2024 0547   AST 21 08/10/2024 1019   AST 22 08/03/2024 0858   ALT 76 (H) 08/10/2024 1019   ALT 72 (H) 08/03/2024 0858   ALKPHOS 179 (H) 08/10/2024 1019   BILITOT 2.1 (H) 08/10/2024 1019   BILITOT 2.5 (H) 08/03/2024 0858   PROT 5.1 (L) 08/10/2024 1019   ALBUMIN 3.6 08/10/2024 1019    Physical Exam: Vital Signs: BP 107/68 (BP Location: Right Arm)   Pulse 86   Temp 97.7 F (36.5 C)   Resp 20   Ht 5' 7 (1.702 m)   Wt 73.2 kg   SpO2 91%   BMI 25.28 kg/m  SpO2: SpO2: 91 % O2 Device: O2 Device: Room Air O2 Flow Rate: O2 Flow Rate (L/min): 2 L/min Intake/output summary:  Intake/Output Summary (Last 24 hours) at 08/12/2024 0838 Last data filed at 08/11/2024 1500 Gross per 24 hour  Intake 480 ml  Output --  Net 480 ml   LBM: Last BM Date : 08/11/24 Baseline Weight: Weight: 73.2 kg Most recent weight: Weight: 73.2 kg  General: NAD, alert, chronically ill-appearing Cardiovascular: RRR Respiratory: no increased work of breathing noted, not in respiratory distress Abdomen: not distended Neuro: A&Ox4, following commands easily Psych: appropriately answers all questions         Palliative Performance Scale: 60%              Additional Data Reviewed: Recent Labs    08/10/24 1019 08/10/24 1907 08/11/24 0547 08/12/24 0821  WBC 10.6*  --  10.2 12.8*  HGB 5.3*   < > 7.8* 8.4*  PLT 55*  --  56* 47*  NA 135  --  137  --   BUN 37*  --  33*  --   CREATININE 1.21  --  1.09  --    < > = values in this interval not displayed.    Imaging: CT ABDOMEN PELVIS W  CONTRAST EXAM: CT ABDOMEN AND PELVIS WITH CONTRAST 08/10/2024 11:51:11 AM  TECHNIQUE: CT of the abdomen and pelvis was performed with the administration of 100 mL of iohexol  (OMNIPAQUE ) 300 MG/ML solution. Multiplanar reformatted images are provided for review. Automated exposure control, iterative reconstruction, and/or weight-based adjustment of the mA/kV was utilized  to reduce the radiation dose to as low as reasonably achievable.  COMPARISON: 08/03/2024  CLINICAL HISTORY: Abdominal pain, acute, nonlocalized; Striae of MDS with prior splenic sequestration, eval for worsening splenomegaly, hepatomegaly.  FINDINGS:  LOWER CHEST: Bibasilar compressive atelectasis. Moderate bilateral pleural effusions.  LIVER: Unchanged hepatomegaly.  GALLBLADDER AND BILE DUCTS: Cholelithiasis. No biliary ductal dilatation.  SPLEEN: Stable marked splenomegaly measuring 21.2 cm in the coronal plane. New wedge-shaped hypodensity in lateral spleen measuring 5.9 x 2.6 cm, suggesting early splenic infarct.  PANCREAS: No acute abnormality.  ADRENAL GLANDS: No acute abnormality.  KIDNEYS, URETERS AND BLADDER: Nonobstructive right renal calculi up to 4 mm. Unchanged 8 mm stone in distal left ureter, 1 cm proximal to ureterovesicular junction seen on axial image 80 series 2. Unchanged 8 mm bladder stone. No hydronephrosis. No perinephric or periureteral stranding.  GI AND BOWEL: Small hiatal hernia. Stomach demonstrates no acute abnormality. There is no bowel obstruction.  PERITONEUM AND RETROPERITONEUM: Stable small volume ascites. No free air.  VASCULATURE: Aorta is normal in caliber. Aortic atherosclerosis. Portal venous hypertension with large splenic, gastric, and paraesophageal varices.  LYMPH NODES: No lymphadenopathy.  REPRODUCTIVE ORGANS: Enlarged prostate.  BONES AND SOFT TISSUES: Multilevel spine degenerative changes. No acute osseous abnormality. No focal soft  tissue abnormality.  IMPRESSION: 1. New wedge-shaped hypodensity in the lateral spleen compatible with early splenic infarct, on a background of marked splenomegaly. 2. Unchanged 8 mm distal left ureteral stone approximately 1 cm proximal to the ureterovesical junction, with additional nonobstructive right renal calculi up to 4 mm. 3. Unchanged 8 mm bladder stone. 4. Unchanged hepatomegaly with evidence of portal venous hypertension. 5. Moderate bilateral pleural effusions with bibasilar compressive atelectasis. 6. Aortic atherosclerosis.  Electronically signed by: Ryan Chess Fernando Lee 08/10/2024 12:26 PM EDT RP Workstation: [id number]    I personally reviewed recent imaging.   Palliative Care Assessment and Plan Summary of Established Goals of Care and Medical Treatment Preferences   Patient is is an 78 year old male with a past medical history of JAK2 positive myeloproliferative neoplasm primarily presenting with thrombocytosis and associated MDS causing refractory anemia, massive splenomegaly multifactorial in origin though with underlying component of splenic sequestration, 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  Hemoglobin found to be 5.9.  During hospitalization patient has received management for symptomatic anemia likely due to MDS with noted splenomegaly on imaging.  Oncology consulted for recommendations.  Palliative medicine team consulted to assist with complex medical decision making.  # Complex medical decision making/goals of care  - Discussed care with patient while wife at bedside as detailed above in HPI.  Patient and wife hoping to continue with appropriate aggressive medical interventions at this time.  Continuing transfusions in the outpatient setting.  Provided information about palliative medicine and what this would and would not entail in the outpatient setting.  Also provided information about hospice and what this would  and would would not entail in the outpatient setting when decision made to focus on patient's comfort at end-of-life.  Patient and wife to continue conversations with Fernando Lee moving forward about appropriateness/when to transition to hospice care.  TOC consulted for home palliative medicine referral.  Palliative medicine team will be available if needed.  -  Code Status: Limited: Do not attempt resuscitation (DNR) -DNR-LIMITED -Do Not Intubate/DNI    # Psycho-social/Spiritual Support:  - Support System: Wife  # Discharge Planning:  Home with Palliative Services - TOC consulted for home palliative medicine  referral  Thank you for allowing the palliative care team to participate in the care Lynwood VEAR Mussel.  Tinnie Radar, DO Palliative Care Provider PMT # [phone]  If patient remains symptomatic despite maximum doses, please call PMT at [phone] between 0700 and 1900. Outside of these hours, please call attending, as PMT does not have night coverage.  Personally spent 75 minutes in patient care including extensive chart review (labs, imaging, progress/consult notes, vital signs), medically appropraite exam, discussed with treatment team, education to patient, family, and staff, documenting clinical information, medication review and management, coordination of care, and available advanced directive documents.

## 2024-08-12 NOTE — Progress Notes (Signed)
 [facility] PROGRESS NOTE    Progress Note  Fernando Lee  FMW:[id number] DOB: [birth year] DOA: 08/10/2024 PCP: Dayna Motto, DO     Brief Narrative:   Fernando Lee is an 78 y.o. male past medical history significant for JAK2 poss MDS, splenomegaly, HFpEF comes in with acute on chronic symptomatic anemia, last unit of packed red blood cells was in 08/07/2024, was complaining of fatigue, hemoglobin was 5.9 recheck in the ED 5.3.   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 CBC is pending this morning. Continue IV Lasix , please place TED hose Oncology was consulted, they agree with IV diuresis. They will transfuse twice a week as an outpatient.  Incidental splenic infarct/splenomegaly: Asymptomatic.  Hepatomegaly: Multifactorial.  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  Chronic diastolic dysfunction: Continue IV Lasix  monitor strict I's and O's and daily weights.  Goals of care/ethics: Consulted palliative care.  Patient tells me that Dr. Onesimo related that his medical condition is refractory to treatment. And he recommended moving towards hospice care Transferred to MedSurg.   DVT prophylaxis: scd Family Communication:none Status is: Observation The patient remains OBS appropriate and will d/c before 2 midnights.    Code Status:     Code Status Orders  (From admission, onward)           Start     Ordered   08/10/24 1341  Do not attempt resuscitation (DNR)- Limited -Do Not Intubate (DNI)  Continuous       Question Answer Comment  If pulseless and not breathing No CPR or chest compressions.   In Pre-Arrest Conditions (Patient Is Breathing and Has A Pulse) Do not intubate. Provide all appropriate non-invasive medical interventions. Avoid ICU transfer unless indicated or required.   Consent: Discussion documented in EHR or advanced  directives reviewed      08/10/24 1341           Code Status History     Date Active Date Inactive Code Status Order ID Comments User Context   08/03/2024 1747 08/04/2024 1929 Full Code [id number]  Celinda Alm Lot, MD ED   09/16/2023 1352 09/21/2023 0040 Full Code [id number]  Celinda Alm Lot, MD ED   08/31/2022 2013 09/01/2022 2046 Full Code [id number]  Lonzell Emeline HERO, DO ED         IV Access:   Peripheral IV   Procedures and diagnostic studies:   CT ABDOMEN PELVIS W CONTRAST Result Date: 08/10/2024 EXAM: CT ABDOMEN AND PELVIS WITH CONTRAST 08/10/2024 11:51:11 AM TECHNIQUE: CT of the abdomen and pelvis was performed with the administration of 100 mL of iohexol  (OMNIPAQUE ) 300 MG/ML solution. Multiplanar reformatted images are provided for review. Automated exposure control, iterative reconstruction, and/or weight-based adjustment of the mA/kV was utilized to reduce the radiation dose to as low as reasonably achievable. COMPARISON: 08/03/2024 CLINICAL HISTORY: Abdominal pain, acute, nonlocalized; Striae of MDS with prior splenic sequestration, eval for worsening splenomegaly, hepatomegaly. FINDINGS: LOWER CHEST: Bibasilar compressive atelectasis. Moderate bilateral pleural effusions. LIVER: Unchanged hepatomegaly. GALLBLADDER AND BILE DUCTS: Cholelithiasis. No biliary ductal dilatation. SPLEEN: Stable marked splenomegaly measuring 21.2 cm in the coronal plane. New wedge-shaped hypodensity in lateral spleen measuring 5.9 x 2.6 cm, suggesting early splenic infarct. PANCREAS: No acute abnormality. ADRENAL GLANDS: No acute abnormality. KIDNEYS, URETERS AND BLADDER: Nonobstructive right renal calculi up to 4 mm. Unchanged 8 mm stone in distal left ureter, 1  cm proximal to ureterovesicular junction seen on axial image 80 series 2. Unchanged 8 mm bladder stone. No hydronephrosis. No perinephric or periureteral stranding. GI AND BOWEL: Small hiatal hernia. Stomach demonstrates no acute  abnormality. There is no bowel obstruction. PERITONEUM AND RETROPERITONEUM: Stable small volume ascites. No free air. VASCULATURE: Aorta is normal in caliber. Aortic atherosclerosis. Portal venous hypertension with large splenic, gastric, and paraesophageal varices. LYMPH NODES: No lymphadenopathy. REPRODUCTIVE ORGANS: Enlarged prostate. BONES AND SOFT TISSUES: Multilevel spine degenerative changes. No acute osseous abnormality. No focal soft tissue abnormality. IMPRESSION: 1. New wedge-shaped hypodensity in the lateral spleen compatible with early splenic infarct, on a background of marked splenomegaly. 2. Unchanged 8 mm distal left ureteral stone approximately 1 cm proximal to the ureterovesical junction, with additional nonobstructive right renal calculi up to 4 mm. 3. Unchanged 8 mm bladder stone. 4. Unchanged hepatomegaly with evidence of portal venous hypertension. 5. Moderate bilateral pleural effusions with bibasilar compressive atelectasis. 6. Aortic atherosclerosis. Electronically signed by: Ryan Chess MD 08/10/2024 12:26 PM EDT RP Workstation: [id number]     Medical Consultants:   None.   Subjective:    Fernando Lee feels better than yesterday.  Objective:    Vitals:   08/11/24 1313 08/11/24 1641 08/11/24 2024 08/12/24 0451  BP: 112/71 112/65 112/67 107/68  Pulse: 77 86 85 86  Resp:  17 17 20   Temp: 98.4 F (36.9 C) 97.9 F (36.6 C) 98.2 F (36.8 C) 97.7 F (36.5 C)  TempSrc: Oral  Oral   SpO2: 97% 97%  91%  Weight:      Height:       SpO2: 91 % O2 Flow Rate (L/min): 2 L/min   Intake/Output Summary (Last 24 hours) at 08/12/2024 0817 Last data filed at 08/11/2024 1500 Gross per 24 hour  Intake 480 ml  Output --  Net 480 ml   Filed Weights   08/10/24 2336  Weight: 73.2 kg    Exam: General exam: In no acute distress. Respiratory system: Good air movement and clear to auscultation. Cardiovascular system: S1 & S2 heard, RRR. No JVD. Gastrointestinal  system: Abdomen is nondistended, soft and nontender.  Extremities: 2+ edema Skin: No rashes, lesions or ulcers Psychiatry: Judgement and insight appear normal. Mood & affect appropriate.  Data Reviewed:    Labs: Basic Metabolic Panel: Recent Labs  Lab 08/10/24 1019 08/11/24 0547  NA 135 137  K 4.6 4.5  CL 101 102  CO2 26 26  GLUCOSE 172* 155*  BUN 37* 33*  CREATININE 1.21 1.09  CALCIUM  8.5* 8.5*   GFR Estimated Creatinine Clearance: 50.5 mL/min (by C-G formula based on SCr of 1.09 mg/dL). Liver Function Tests: Recent Labs  Lab 08/10/24 1019  AST 21  ALT 76*  ALKPHOS 179*  BILITOT 2.1*  PROT 5.1*  ALBUMIN 3.6   No results for input(s): LIPASE, AMYLASE in the last 168 hours. No results for input(s): AMMONIA in the last 168 hours. Coagulation profile No results for input(s): INR, PROTIME in the last 168 hours. COVID-19 Labs  No results for input(s): DDIMER, FERRITIN, LDH, CRP in the last 72 hours.  No results found for: SARSCOV2NAA  CBC: Recent Labs  Lab 08/06/24 1250 08/10/24 0733 08/10/24 1019 08/10/24 1907 08/11/24 0547  WBC 8.0 11.2* 10.6*  --  10.2  NEUTROABS 3.8 4.7 5.5  --   --   HGB 6.5* 5.9* 5.3* 6.2* 7.8*  HCT 19.5* 17.5* 17.4* 19.9* 23.8*  MCV 97.5 100.6* 104.8*  --  101.3*  PLT 54* 47* 55*  --  56*   Cardiac Enzymes: No results for input(s): CKTOTAL, CKMB, CKMBINDEX, TROPONINI in the last 168 hours. BNP (last 3 results) Recent Labs    08/03/24 1444 08/10/24 1019  PROBNP 481.0* 613.0*   CBG: No results for input(s): GLUCAP in the last 168 hours. D-Dimer: No results for input(s): DDIMER in the last 72 hours. Hgb A1c: No results for input(s): HGBA1C in the last 72 hours. Lipid Profile: No results for input(s): CHOL, HDL, LDLCALC, TRIG, CHOLHDL, LDLDIRECT in the last 72 hours. Thyroid  function studies: No results for input(s): TSH, T4TOTAL, T3FREE, THYROIDAB in the last 72  hours.  Invalid input(s): FREET3 Anemia work up: No results for input(s): VITAMINB12, FOLATE, FERRITIN, TIBC, IRON , RETICCTPCT in the last 72 hours. Sepsis Labs: Recent Labs  Lab 08/06/24 1250 08/10/24 0733 08/10/24 1019 08/11/24 0547  WBC 8.0 11.2* 10.6* 10.2   Microbiology No results found for this or any previous visit (from the past 240 hours).   Medications:    Chlorhexidine  Gluconate Cloth  6 each Topical Daily   furosemide   40 mg Intravenous Q12H   furosemide   20 mg Oral Daily   sodium chloride  flush  10-40 mL Intracatheter Q12H   Continuous Infusions:    LOS: 1 day   Fernando Lee  [facility]  08/12/2024, 8:17 AM

## 2024-08-12 NOTE — Progress Notes (Signed)
 PT Cancellation Note  Patient Details Name: Fernando Lee MRN: [id number] DOB: 05-02-1946   Cancelled Treatment:    Checked on pt several times today , and had important visits by MD and then some supportive family visitors and preferred to hold until they left. However they were still there when checked a 3rd time.   We will check on pt tomorrow. He stated he had been up in his room but not out in the hallway, will continue to assess his ability and any further needs at that time.  Thank you     MILLICENT SALES 08/12/2024, 4:34 PM Sales, PT, MPT Acute Rehabilitation Services Office: [phone] If a weekend: secure chat groups: [facility] PT, [facility] OT, [facility] SLP 08/12/2024

## 2024-08-13 DIAGNOSIS — D649 Anemia, unspecified: Secondary | ICD-10-CM | POA: Diagnosis not present

## 2024-08-13 DIAGNOSIS — R161 Splenomegaly, not elsewhere classified: Secondary | ICD-10-CM

## 2024-08-13 DIAGNOSIS — Z1589 Genetic susceptibility to other disease: Secondary | ICD-10-CM | POA: Diagnosis not present

## 2024-08-13 DIAGNOSIS — D469 Myelodysplastic syndrome, unspecified: Secondary | ICD-10-CM | POA: Diagnosis not present

## 2024-08-13 DIAGNOSIS — D735 Infarction of spleen: Secondary | ICD-10-CM | POA: Diagnosis not present

## 2024-08-13 DIAGNOSIS — Z7189 Other specified counseling: Secondary | ICD-10-CM | POA: Diagnosis not present

## 2024-08-13 LAB — BPAM RBC
Blood Product Expiration Date: 202511192359
ISSUE DATE / TIME: 202510241136
Unit Type and Rh: 6200

## 2024-08-13 LAB — HEMOGLOBIN AND HEMATOCRIT, BLOOD
HCT: 26.2 % — ABNORMAL LOW (ref 39.0–52.0)
Hemoglobin: 8.5 g/dL — ABNORMAL LOW (ref 13.0–17.0)

## 2024-08-13 LAB — CBC
HCT: 21.7 % — ABNORMAL LOW (ref 39.0–52.0)
Hemoglobin: 6.9 g/dL — CL (ref 13.0–17.0)
MCH: 32.4 pg (ref 26.0–34.0)
MCHC: 31.8 g/dL (ref 30.0–36.0)
MCV: 101.9 fL — ABNORMAL HIGH (ref 80.0–100.0)
Platelets: 44 K/uL — ABNORMAL LOW (ref 150–400)
RBC: 2.13 MIL/uL — ABNORMAL LOW (ref 4.22–5.81)
RDW: 20.7 % — ABNORMAL HIGH (ref 11.5–15.5)
WBC: 11.8 K/uL — ABNORMAL HIGH (ref 4.0–10.5)
nRBC: 0.3 % — ABNORMAL HIGH (ref 0.0–0.2)

## 2024-08-13 LAB — TYPE AND SCREEN
ABO/RH(D): A POS
Antibody Screen: NEGATIVE
Unit division: 0

## 2024-08-13 LAB — BASIC METABOLIC PANEL WITH GFR
Anion gap: 9 (ref 5–15)
BUN: 38 mg/dL — ABNORMAL HIGH (ref 8–23)
CO2: 27 mmol/L (ref 22–32)
Calcium: 8.2 mg/dL — ABNORMAL LOW (ref 8.9–10.3)
Chloride: 101 mmol/L (ref 98–111)
Creatinine, Ser: 1.27 mg/dL — ABNORMAL HIGH (ref 0.61–1.24)
GFR, Estimated: 57 mL/min — ABNORMAL LOW (ref >60)
Glucose, Bld: 125 mg/dL — ABNORMAL HIGH (ref 70–99)
Potassium: 4.2 mmol/L (ref 3.5–5.1)
Sodium: 137 mmol/L (ref 135–145)

## 2024-08-13 MED ORDER — HEPARIN SOD (PORK) LOCK FLUSH 100 UNIT/ML IV SOLN: 500.0000 [IU] | Freq: Once | INTRAVENOUS | Status: DC

## 2024-08-13 MED ORDER — FUROSEMIDE 20 MG PO TABS: 40.0000 mg | ORAL_TABLET | Freq: Every day | ORAL | 0 refills | Status: DC

## 2024-08-13 MED ORDER — HEPARIN SOD (PORK) LOCK FLUSH 100 UNIT/ML IV SOLN
500.0000 [IU] | INTRAVENOUS | Status: AC | PRN
  Administered 2024-08-13: 500 [IU]

## 2024-08-13 MED ORDER — SODIUM CHLORIDE 0.9% IV SOLUTION: Freq: Once | INTRAVENOUS | Status: DC

## 2024-08-13 NOTE — Progress Notes (Signed)
 Fernando Lee   DOB:[birth year]   FM#:[id number]      ASSESSMENT & PLAN:  Fernando Lee is an 78 year old male patient with oncologic history MDS, JAK2 myeloproliferative neoplasm.  He was admitted on 08/10/2024 from outpatient office due to significant anemia.  Follows with Dr. Claiborne oncology.  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 -Patient was seen in outpatient oncology office 08/10/2024 and was sent to ED due to hemoglobin 5.3.  He is status post multiple units of PRBC transfusion. - Hemoglobin improved to 8.5 today. - Patient started on Ojjaara on 08/09/2024 and okay to continue during hospitalization.  Jadenu  has been on hold.  Treatment is palliative at this point. - Okay for discharge from oncology point of view with follow-up 2 times per week for transfusion as needed. - Medical oncology/Dr. Onesimo following closely  Splenomegaly Abdominal pain - Related to malignancy - Continue supportive care     Code Status DNR-Limited  Subjective:  Patient seen awake and alert ambulating to the bathroom.  Wife at bedside.  Reports that abdominal pain is much better and is only slight.  Patient and wife are agreeable to follow-up in outpatient oncology 2 times per week to check labs and receive transfusion if needed.  Denies acute GI symptoms.  Reports feeling much better.  Objective:   Intake/Output Summary (Last 24 hours) at 08/13/2024 1013 Last data filed at 08/13/2024 0750 Gross per 24 hour  Intake 1848.67 ml  Output --  Net 1848.67 ml     PHYSICAL EXAMINATION: ECOG PERFORMANCE STATUS: 1 - Symptomatic but completely ambulatory  Vitals:   08/13/24 0454 08/13/24 0721  BP: 114/62 109/70  Pulse: 79 68  Resp: 18 18  Temp: 98.3 F (36.8 C) 98.1 F (36.7 C)  SpO2: 92% 96%   Filed Weights   08/10/24 2336  Weight: 161 lb 6 oz (73.2 kg)    GENERAL: alert,  no distress and comfortable SKIN: skin color, texture, turgor are normal, no rashes or significant lesions EYES: normal, conjunctiva are pink and non-injected, sclera clear OROPHARYNX: no exudate, no erythema and lips, buccal mucosa, and tongue normal  NECK: supple, thyroid  normal size, non-tender, without nodularity LYMPH: no palpable lymphadenopathy in the cervical, axillary or inguinal LUNGS: clear to auscultation and percussion with normal breathing effort HEART: regular rate & rhythm and no murmurs and no lower extremity edema ABDOMEN: abdomen soft, non-tender and normal bowel sounds MUSCULOSKELETAL: no cyanosis of digits and no clubbing  PSYCH: alert & oriented x 3 with fluent speech NEURO: no focal motor/sensory deficits   All questions were answered. The patient knows to call the clinic with any problems, questions or concerns.   The total time spent in the appointment was 40 minutes encounter with patient including review of chart and various tests results, discussions about plan of care and coordination of care plan  Olam JINNY Brunner, NP 08/13/2024 10:13 AM    Labs Reviewed:  Lab Results  Component Value Date   WBC 11.8 (H) 08/13/2024   HGB 8.5 (L) 08/13/2024   HCT 26.2 (L) 08/13/2024   MCV 101.9 (H) 08/13/2024   PLT 44 (L) 08/13/2024   Recent Labs    08/03/24 1444 08/04/24 0455 08/10/24 1019 08/11/24 0547 08/12/24 0821 08/13/24 0310  NA 133* 136 135 137 137 137  K 4.4 4.3 4.6 4.5 3.7 4.2  CL 99 103 101 102 101 101  CO2 25 26 26  26 26 27   GLUCOSE 204* 103* 172* 155* 172* 125*  BUN 26* 23 37* 33* 31* 38*  CREATININE 0.92 0.87 1.21 1.09 1.13 1.27*  CALCIUM  8.2* 8.1* 8.5* 8.5* 8.8* 8.2*  GFRNONAA >60 >60 >60 >60 >60 57*  PROT 5.0* 4.7* 5.1*  --   --   --   ALBUMIN 3.8 3.6 3.6  --   --   --   AST 24 22 21   --   --   --   ALT 78* 70* 76*  --   --   --   ALKPHOS 160* 164* 179*  --   --   --   BILITOT 1.9* 2.5* 2.1*  --   --   --     Studies Reviewed:  CT  ABDOMEN PELVIS W CONTRAST Result Date: 08/10/2024 EXAM: CT ABDOMEN AND PELVIS WITH CONTRAST 08/10/2024 11:51:11 AM TECHNIQUE: CT of the abdomen and pelvis was performed with the administration of 100 mL of iohexol  (OMNIPAQUE ) 300 MG/ML solution. Multiplanar reformatted images are provided for review. Automated exposure control, iterative reconstruction, and/or weight-based adjustment of the mA/kV was utilized to reduce the radiation dose to as low as reasonably achievable. COMPARISON: 08/03/2024 CLINICAL HISTORY: Abdominal pain, acute, nonlocalized; Striae of MDS with prior splenic sequestration, eval for worsening splenomegaly, hepatomegaly. FINDINGS: LOWER CHEST: Bibasilar compressive atelectasis. Moderate bilateral pleural effusions. LIVER: Unchanged hepatomegaly. GALLBLADDER AND BILE DUCTS: Cholelithiasis. No biliary ductal dilatation. SPLEEN: Stable marked splenomegaly measuring 21.2 cm in the coronal plane. New wedge-shaped hypodensity in lateral spleen measuring 5.9 x 2.6 cm, suggesting early splenic infarct. PANCREAS: No acute abnormality. ADRENAL GLANDS: No acute abnormality. KIDNEYS, URETERS AND BLADDER: Nonobstructive right renal calculi up to 4 mm. Unchanged 8 mm stone in distal left ureter, 1 cm proximal to ureterovesicular junction seen on axial image 80 series 2. Unchanged 8 mm bladder stone. No hydronephrosis. No perinephric or periureteral stranding. GI AND BOWEL: Small hiatal hernia. Stomach demonstrates no acute abnormality. There is no bowel obstruction. PERITONEUM AND RETROPERITONEUM: Stable small volume ascites. No free air. VASCULATURE: Aorta is normal in caliber. Aortic atherosclerosis. Portal venous hypertension with large splenic, gastric, and paraesophageal varices. LYMPH NODES: No lymphadenopathy. REPRODUCTIVE ORGANS: Enlarged prostate. BONES AND SOFT TISSUES: Multilevel spine degenerative changes. No acute osseous abnormality. No focal soft tissue abnormality. IMPRESSION: 1. New  wedge-shaped hypodensity in the lateral spleen compatible with early splenic infarct, on a background of marked splenomegaly. 2. Unchanged 8 mm distal left ureteral stone approximately 1 cm proximal to the ureterovesical junction, with additional nonobstructive right renal calculi up to 4 mm. 3. Unchanged 8 mm bladder stone. 4. Unchanged hepatomegaly with evidence of portal venous hypertension. 5. Moderate bilateral pleural effusions with bibasilar compressive atelectasis. 6. Aortic atherosclerosis. Electronically signed by: Ryan Chess MD 08/10/2024 12:26 PM EDT RP Workstation: [id number]   ECHOCARDIOGRAM COMPLETE Result Date: 08/07/2024    ECHOCARDIOGRAM REPORT   Patient Name:   TYKE OUTMAN Date of Exam: 08/07/2024 Medical Rec #:  [id number]     Height:       67.0 in Accession #:    [id number]    Weight:       165.0 lb Date of Birth:  August 18, 1946      BSA:          1.863 m Patient Age:    78 years      BP:           102/63 mmHg Patient Gender: M  HR:           54 bpm. Exam Location:  Outpatient Procedure: 2D Echo, Cardiac Doppler, Color Doppler and Strain Analysis (Both            Spectral and Color Flow Doppler were utilized during procedure). Indications:    CHF  History:        Patient has prior history of Echocardiogram examinations, most                 recent 11/14/2023. Arrythmias:Tachycardia; Risk                 Factors:Dyslipidemia and Sleep Apnea.  Sonographer:    Philomena Daring RDCS Referring Phys: [id number] EMALINE CANDIDA SARAN  Sonographer Comments: Global longitudinal strain was attempted. IMPRESSIONS  1. Left ventricular ejection fraction, by estimation, is 60 to 65%. The left ventricle has normal function. The left ventricle has no regional wall motion abnormalities. There is mild concentric left ventricular hypertrophy. Left ventricular diastolic parameters are consistent with Grade I diastolic dysfunction (impaired relaxation). The average left ventricular global longitudinal strain  is -12.8 %. The global longitudinal strain is abnormal.  2. Right ventricular systolic function is normal. The right ventricular size is normal. There is moderately elevated pulmonary artery systolic pressure. The estimated right ventricular systolic pressure is 45.5 mmHg.  3. Left atrial size was mildly dilated.  4. Small mobile echodensity seen on posterior leaflet near annulus, on atrial aspect of valve. Bowing of mitral valve leaflets without frank prolapse. The mitral valve is degenerative. Mild mitral valve regurgitation. No evidence of mitral stenosis.  5. The aortic valve is tricuspid. There is mild calcification of the aortic valve. There is mild thickening of the aortic valve. Aortic valve regurgitation is mild. No aortic stenosis is present.  6. The inferior vena cava is normal in size with <50% respiratory variability, suggesting right atrial pressure of 8 mmHg. Comparison(s): Prior images reviewed side by side. Conclusion(s)/Recommendation(s): Small mobile echodensity seen on posterior mitral valve leaflet near annulus. This may be degenerative valve tissue, but if there is clinical concern for endocarditis, recommend TEE for further evaluation. FINDINGS  Left Ventricle: Left ventricular ejection fraction, by estimation, is 60 to 65%. The left ventricle has normal function. The left ventricle has no regional wall motion abnormalities. The average left ventricular global longitudinal strain is -12.8 %. Strain was performed and the global longitudinal strain is abnormal. The left ventricular internal cavity size was normal in size. There is mild concentric left ventricular hypertrophy. Left ventricular diastolic parameters are consistent with Grade I diastolic dysfunction (impaired relaxation). Right Ventricle: The right ventricular size is normal. No increase in right ventricular wall thickness. Right ventricular systolic function is normal. There is moderately elevated pulmonary artery systolic  pressure. The tricuspid regurgitant velocity is 3.06 m/s, and with an assumed right atrial pressure of 8 mmHg, the estimated right ventricular systolic pressure is 45.5 mmHg. Left Atrium: Left atrial size was mildly dilated. Right Atrium: Right atrial size was normal in size. Pericardium: Trivial pericardial effusion is present. Mitral Valve: Small mobile echodensity seen on posterior leaflet near annulus, on atrial aspect of valve. Bowing of mitral valve leaflets without frank prolapse. The mitral valve is degenerative in appearance. Mild mitral valve regurgitation. No evidence  of mitral valve stenosis. Tricuspid Valve: The tricuspid valve is normal in structure. Tricuspid valve regurgitation is mild . No evidence of tricuspid stenosis. Aortic Valve: The aortic valve is tricuspid. There is mild calcification of the aortic  valve. There is mild thickening of the aortic valve. Aortic valve regurgitation is mild. No aortic stenosis is present. Pulmonic Valve: The pulmonic valve was grossly normal. Pulmonic valve regurgitation is mild. No evidence of pulmonic stenosis. Aorta: The aortic root, ascending aorta, aortic arch and descending aorta are all structurally normal, with no evidence of dilitation or obstruction. Venous: The inferior vena cava is normal in size with less than 50% respiratory variability, suggesting right atrial pressure of 8 mmHg. IAS/Shunts: The atrial septum is grossly normal. Additional Comments: There is a small pleural effusion in the left lateral region. Mild ascites is present.  LEFT VENTRICLE PLAX 2D LVIDd:         4.15 cm   Diastology LVIDs:         2.58 cm   LV e' medial:    6.20 cm/s LV PW:         1.09 cm   LV E/e' medial:  16.6 LV IVS:        1.14 cm   LV e' lateral:   9.14 cm/s LVOT diam:     2.01 cm   LV E/e' lateral: 11.3 LV SV:         80 LV SV Index:   43        2D Longitudinal Strain LVOT Area:     3.17 cm  2D Strain GLS (A4C):   -12.6 %                          2D Strain GLS  (A3C):   -13.6 %                          2D Strain GLS (A2C):   -12.2 %                          2D Strain GLS Avg:     -12.8 % RIGHT VENTRICLE             IVC RV Basal diam:  3.17 cm     IVC diam: 1.32 cm RV Mid diam:    2.77 cm RV S prime:     21.10 cm/s TAPSE (M-mode): 2.1 cm LEFT ATRIUM             Index        RIGHT ATRIUM           Index LA diam:        4.04 cm 2.17 cm/m   RA Area:     15.70 cm LA Vol (A2C):   52.4 ml 28.12 ml/m  RA Volume:   34.10 ml  18.30 ml/m LA Vol (A4C):   59.9 ml 32.14 ml/m LA Biplane Vol: 56.4 ml 30.27 ml/m  AORTIC VALVE LVOT Vmax:   139.00 cm/s LVOT Vmean:  89.800 cm/s LVOT VTI:    0.252 m  AORTA Ao Root diam: 3.37 cm Ao Asc diam:  3.60 cm MITRAL VALVE                TRICUSPID VALVE MV Area (PHT): 3.19 cm     TR Peak grad:   37.5 mmHg MV Decel Time: 238 msec     TR Vmax:        306.00 cm/s MV E velocity: 103.00 cm/s MV A velocity: 139.00 cm/s  SHUNTS MV E/A ratio:  0.74  Systemic VTI:  0.25 m                             Systemic Diam: 2.01 cm Shelda Bruckner MD Electronically signed by Shelda Bruckner MD Signature Date/Time: 08/07/2024/4:53:14 PM    Final    CT ABDOMEN PELVIS W CONTRAST Result Date: 08/03/2024 CLINICAL DATA:  Splenomegaly, right upper quadrant pain EXAM: CT ABDOMEN AND PELVIS WITH CONTRAST TECHNIQUE: Multidetector CT imaging of the abdomen and pelvis was performed using the standard protocol following bolus administration of intravenous contrast. RADIATION DOSE REDUCTION: This exam was performed according to the departmental dose-optimization program which includes automated exposure control, adjustment of the mA and/or kV according to patient size and/or use of iterative reconstruction technique. CONTRAST:  OMNIPAQUE  IOHEXOL  300 MG/ML  SOLN COMPARISON:  08/03/2024, 08/31/2022, 07/18/2007 FINDINGS: Lower chest: Moderate bilateral free-flowing pleural effusions with bilateral lower lobe compressive atelectasis. Central venous  catheter tip at the atriocaval junction. Hepatobiliary: The liver is enlarged, without focal parenchymal abnormality. Small gallstones layer dependently within the gallbladder fundus, with no evidence of gallbladder wall thickening on this exam. No biliary duct dilation or choledocholithiasis. Pancreas: Unremarkable. No pancreatic ductal dilatation or surrounding inflammatory changes. Spleen: Marked splenomegaly, measuring 23.9 x 19.3 x 12.1 cm. No focal parenchymal splenic abnormality. Adrenals/Urinary Tract: Nonobstructing 5 mm calculus lower pole right kidney. Nonobstructing 10 mm distal left ureteral calculus just proximal to the UVJ. 9 mm nonobstructing bladder calculus. No obstructive uropathy within either kidney. Kidneys enhance normally. Left kidney is displaced inferiorly due to the enlarged spleen. The adrenals are unremarkable. Stomach/Bowel: Small hiatal hernia. No bowel obstruction or ileus. Normal appendix right lower quadrant. No bowel wall thickening or inflammatory change. Vascular/Lymphatic: The splenic vein, SMV, and portal vein are patent. Portal venous hypertension manifested by enlargement of the portal vein and splenic vein, tortuosity of the splenic vein, and recanalization of the umbilical vein. Atherosclerosis of the abdominal aorta. No pathologic adenopathy. Reproductive: Stable enlargement of the prostate. Other: Trace abdominal and pelvic ascites, greatest in the right upper quadrant. No free intraperitoneal gas. Musculoskeletal: No acute or destructive bony lesions. Reconstructed images demonstrate no additional findings. IMPRESSION: 1. Marked splenomegaly. 2. Cholelithiasis without cholecystitis. 3. Portal venous hypertension manifested by enlarged tortuous portal and splenic veins, recanalization of the umbilical vein, and trace abdominal and pelvic ascites. 4. Moderate free-flowing bilateral pleural effusions with bilateral lower lobe compressive atelectasis. 5. Small hiatal hernia.  6. Nonobstructing calculi within the right kidney, distal left ureter, and bladder as above. Electronically Signed   By: Ozell Daring M.D.   On: 08/03/2024 17:24   US  Abdomen Limited RUQ (LIVER/GB) Result Date: 08/03/2024 CLINICAL DATA:  Right upper quadrant pain EXAM: ULTRASOUND ABDOMEN LIMITED RIGHT UPPER QUADRANT COMPARISON:  10/25/2023 FINDINGS: Gallbladder: Multiple small shadowing calculi layer dependently within the gallbladder. Borderline gallbladder wall thickening measuring 4 mm. Trace pericholecystic fluid, nonspecific given presence of ascites elsewhere in the right upper quadrant. Negative sonographic Murphy sign. Common bile duct: Diameter: 10 mm Liver: Stable increased liver echotexture most compatible with hepatic steatosis. The liver is enlarged measuring 22.1 cm in the midclavicular line. No intrahepatic biliary duct dilation or focal hepatic lesion. Portal vein is patent, with bidirectional flow noted within the portal vein on Doppler interrogation. Other: Small volume ascites within the right upper quadrant. IMPRESSION: 1. Cholelithiasis, with equivocal evidence of cholecystitis. Mild gallbladder wall thickening and pericholecystic fluid could be due to known history of underlying  liver disease and adjacent ascites. If further interrogation is desired, nuclear medicine hepatobiliary scan could be considered. 2. Stable hepatomegaly and increased liver echotexture, consistent with hepatic steatosis. 3. Dilated common bile duct measuring 10 mm. No evidence of choledocholithiasis or intrahepatic biliary duct dilation. 4. Small volume ascites. 5. Patent portal vein, with interval development of bidirectional flow on Doppler interrogation. Electronically Signed   By: Ozell Daring M.D.   On: 08/03/2024 15:03   DG Chest 2 View Result Date: 08/03/2024 CLINICAL DATA:  Fluid overload. EXAM: CHEST - 2 VIEW COMPARISON:  12/17/2019. FINDINGS: There are bilateral new small pleural effusions.  Bilateral lung fields are otherwise clear. No frank pulmonary edema. No pneumothorax on either side. Stable cardio-mediastinal silhouette. No acute osseous abnormalities. Right-sided CT Port-A-Cath is seen with its tip overlying the cavoatrial junction region. The soft tissues are within normal limits. IMPRESSION: Bilateral new small pleural effusions. Electronically Signed   By: Ree Molt M.D.   On: 08/03/2024 13:44

## 2024-08-13 NOTE — Discharge Summary (Signed)
 Physician Discharge Summary  Fernando Lee FMW:[id number] DOB: 11/22/1945 DOA: 08/10/2024  PCP: Dayna Motto, DO  Admit date: 08/10/2024 Discharge date: 08/13/2024  Admitted From: Home Disposition:  Home  Recommendations for Outpatient Follow-up:  Follow up with Oncology in 1-2 weeks Please obtain BMP/CBC in one week Palliative care to follow-up at home.   Home Health:No Equipment/Devices:None  Discharge Condition:Guarded CODE STATUS:DNR/DNI Diet recommendation: Heart Healthy  Brief/Interim Summary:  78 y.o. male past medical history significant for JAK2 poss MDS, splenomegaly, HFpEF comes in with acute on chronic symptomatic anemia, last unit of packed red blood cells was in 08/07/2024, was complaining of fatigue, hemoglobin was 5.9 recheck in the ED 5.3.   Discharge Diagnoses:  Principal Problem:   Symptomatic anemia Active Problems:   MDS (myelodysplastic syndrome) (HCC)   JAK2 gene mutation   Splenomegaly   Splenic infarct   Counseling regarding advance care planning and goals of care   Splenic infarction   Goals of care, counseling/discussion   Counseling and coordination of care   Palliative care encounter  Symptomatic anemia likely due to MDS: There was no evidence of bleeding. He was transfused 2 units packed red blood cells, on the day of discharge hemoglobin was 8.8. He was also given IV Lasix  with transfusions. He will follow-up with oncology as an outpatient. Palliative care was consulted as the patient had an extremely poor prognosis. No follow-up as an outpatient at home.  Incidental splenic infarct/splenomegaly/hepatomegaly: Currently asymptomatic.  MDS/thrombocytopenia: Oncology evaluated the patient in the hospital and spoke to him about hospice but the patient and family were not ready. They would like to continue with treatment, palliative care will follow-up with them as an outpatient.  Chronic diastolic dysfunction: He was given IV Lasix  at  mild diuresis. His Lasix  dose was increased he was doing continue as an outpatient.  Goals of care/fracture: Palliative care to follow-up at home  Discharge Instructions  Discharge Instructions     Diet - low sodium heart healthy   Complete by: As directed    Increase activity slowly   Complete by: As directed       Allergies as of 08/13/2024       Reactions   Lactose Intolerance (gi) Diarrhea        Medication List     PAUSE taking these medications    Deferasirox  180 MG Tabs Wait to take this until your doctor or other care provider tells you to start again. Commonly known as: Jadenu  Take 2 tablets (360 mg total) by mouth daily. Take on an empty stomach.       TAKE these medications    acetaminophen  325 MG tablet Commonly known as: TYLENOL  Take 2 tablets (650 mg total) by mouth every 6 (six) hours as needed for mild pain (pain score 1-3) (or Fever >/= 101).   alfuzosin  10 MG 24 hr tablet Commonly known as: UROXATRAL  Take 10 mg by mouth at bedtime.   B COMPLEX PO Take 1 tablet by mouth daily.   docusate sodium  100 MG capsule Commonly known as: COLACE Take 100 mg by mouth daily.   FIBER PO Take 1 capsule by mouth daily.   FLAXSEED OIL PO Take 1 capsule by mouth daily.   furosemide  20 MG tablet Commonly known as: LASIX  Take 2 tablets (40 mg total) by mouth daily. What changed: how much to take   Ojjaara 100 MG tablet Generic drug: momelotinib dihydrochloride Take 1 tablet (100 mg total) by mouth daily.   potassium  chloride SA 20 MEQ tablet Commonly known as: KLOR-CON  M Take 1 tablet by mouth twice daily What changed:  how much to take when to take this   TURMERIC (CURCUMIN) PO Take 1 tablet by mouth daily.   valACYclovir  500 MG tablet Commonly known as: VALTREX  Take 1 tablet (500 mg total) by mouth 2 (two) times daily.   VITAMIN C  PO Take 1 tablet by mouth daily.   VITAMIN D -3 PO Take 1 capsule by mouth daily.         Allergies  Allergen Reactions   Lactose Intolerance (Gi) Diarrhea    Consultations: Oncology Palliative care   Procedures/Studies: CT ABDOMEN PELVIS W CONTRAST Result Date: 08/10/2024 EXAM: CT ABDOMEN AND PELVIS WITH CONTRAST 08/10/2024 11:51:11 AM TECHNIQUE: CT of the abdomen and pelvis was performed with the administration of 100 mL of iohexol  (OMNIPAQUE ) 300 MG/ML solution. Multiplanar reformatted images are provided for review. Automated exposure control, iterative reconstruction, and/or weight-based adjustment of the mA/kV was utilized to reduce the radiation dose to as low as reasonably achievable. COMPARISON: 08/03/2024 CLINICAL HISTORY: Abdominal pain, acute, nonlocalized; Striae of MDS with prior splenic sequestration, eval for worsening splenomegaly, hepatomegaly. FINDINGS: LOWER CHEST: Bibasilar compressive atelectasis. Moderate bilateral pleural effusions. LIVER: Unchanged hepatomegaly. GALLBLADDER AND BILE DUCTS: Cholelithiasis. No biliary ductal dilatation. SPLEEN: Stable marked splenomegaly measuring 21.2 cm in the coronal plane. New wedge-shaped hypodensity in lateral spleen measuring 5.9 x 2.6 cm, suggesting early splenic infarct. PANCREAS: No acute abnormality. ADRENAL GLANDS: No acute abnormality. KIDNEYS, URETERS AND BLADDER: Nonobstructive right renal calculi up to 4 mm. Unchanged 8 mm stone in distal left ureter, 1 cm proximal to ureterovesicular junction seen on axial image 80 series 2. Unchanged 8 mm bladder stone. No hydronephrosis. No perinephric or periureteral stranding. GI AND BOWEL: Small hiatal hernia. Stomach demonstrates no acute abnormality. There is no bowel obstruction. PERITONEUM AND RETROPERITONEUM: Stable small volume ascites. No free air. VASCULATURE: Aorta is normal in caliber. Aortic atherosclerosis. Portal venous hypertension with large splenic, gastric, and paraesophageal varices. LYMPH NODES: No lymphadenopathy. REPRODUCTIVE ORGANS: Enlarged prostate.  BONES AND SOFT TISSUES: Multilevel spine degenerative changes. No acute osseous abnormality. No focal soft tissue abnormality. IMPRESSION: 1. New wedge-shaped hypodensity in the lateral spleen compatible with early splenic infarct, on a background of marked splenomegaly. 2. Unchanged 8 mm distal left ureteral stone approximately 1 cm proximal to the ureterovesical junction, with additional nonobstructive right renal calculi up to 4 mm. 3. Unchanged 8 mm bladder stone. 4. Unchanged hepatomegaly with evidence of portal venous hypertension. 5. Moderate bilateral pleural effusions with bibasilar compressive atelectasis. 6. Aortic atherosclerosis. Electronically signed by: Ryan Chess MD 08/10/2024 12:26 PM EDT RP Workstation: [id number]   ECHOCARDIOGRAM COMPLETE Result Date: 08/07/2024    ECHOCARDIOGRAM REPORT   Patient Name:   WALTON DIGILIO Date of Exam: 08/07/2024 Medical Rec #:  [id number]     Height:       67.0 in Accession #:    [id number]    Weight:       165.0 lb Date of Birth:  1946-09-08      BSA:          1.863 m Patient Age:    78 years      BP:           102/63 mmHg Patient Gender: M             HR:           54 bpm. Exam Location:  Outpatient Procedure:  2D Echo, Cardiac Doppler, Color Doppler and Strain Analysis (Both            Spectral and Color Flow Doppler were utilized during procedure). Indications:    CHF  History:        Patient has prior history of Echocardiogram examinations, most                 recent 11/14/2023. Arrythmias:Tachycardia; Risk                 Factors:Dyslipidemia and Sleep Apnea.  Sonographer:    Philomena Daring RDCS Referring Phys: [id number] EMALINE CANDIDA SARAN  Sonographer Comments: Global longitudinal strain was attempted. IMPRESSIONS  1. Left ventricular ejection fraction, by estimation, is 60 to 65%. The left ventricle has normal function. The left ventricle has no regional wall motion abnormalities. There is mild concentric left ventricular hypertrophy. Left ventricular  diastolic parameters are consistent with Grade I diastolic dysfunction (impaired relaxation). The average left ventricular global longitudinal strain is -12.8 %. The global longitudinal strain is abnormal.  2. Right ventricular systolic function is normal. The right ventricular size is normal. There is moderately elevated pulmonary artery systolic pressure. The estimated right ventricular systolic pressure is 45.5 mmHg.  3. Left atrial size was mildly dilated.  4. Small mobile echodensity seen on posterior leaflet near annulus, on atrial aspect of valve. Bowing of mitral valve leaflets without frank prolapse. The mitral valve is degenerative. Mild mitral valve regurgitation. No evidence of mitral stenosis.  5. The aortic valve is tricuspid. There is mild calcification of the aortic valve. There is mild thickening of the aortic valve. Aortic valve regurgitation is mild. No aortic stenosis is present.  6. The inferior vena cava is normal in size with <50% respiratory variability, suggesting right atrial pressure of 8 mmHg. Comparison(s): Prior images reviewed side by side. Conclusion(s)/Recommendation(s): Small mobile echodensity seen on posterior mitral valve leaflet near annulus. This may be degenerative valve tissue, but if there is clinical concern for endocarditis, recommend TEE for further evaluation. FINDINGS  Left Ventricle: Left ventricular ejection fraction, by estimation, is 60 to 65%. The left ventricle has normal function. The left ventricle has no regional wall motion abnormalities. The average left ventricular global longitudinal strain is -12.8 %. Strain was performed and the global longitudinal strain is abnormal. The left ventricular internal cavity size was normal in size. There is mild concentric left ventricular hypertrophy. Left ventricular diastolic parameters are consistent with Grade I diastolic dysfunction (impaired relaxation). Right Ventricle: The right ventricular size is normal. No  increase in right ventricular wall thickness. Right ventricular systolic function is normal. There is moderately elevated pulmonary artery systolic pressure. The tricuspid regurgitant velocity is 3.06 m/s, and with an assumed right atrial pressure of 8 mmHg, the estimated right ventricular systolic pressure is 45.5 mmHg. Left Atrium: Left atrial size was mildly dilated. Right Atrium: Right atrial size was normal in size. Pericardium: Trivial pericardial effusion is present. Mitral Valve: Small mobile echodensity seen on posterior leaflet near annulus, on atrial aspect of valve. Bowing of mitral valve leaflets without frank prolapse. The mitral valve is degenerative in appearance. Mild mitral valve regurgitation. No evidence  of mitral valve stenosis. Tricuspid Valve: The tricuspid valve is normal in structure. Tricuspid valve regurgitation is mild . No evidence of tricuspid stenosis. Aortic Valve: The aortic valve is tricuspid. There is mild calcification of the aortic valve. There is mild thickening of the aortic valve. Aortic valve regurgitation is mild. No aortic stenosis is  present. Pulmonic Valve: The pulmonic valve was grossly normal. Pulmonic valve regurgitation is mild. No evidence of pulmonic stenosis. Aorta: The aortic root, ascending aorta, aortic arch and descending aorta are all structurally normal, with no evidence of dilitation or obstruction. Venous: The inferior vena cava is normal in size with less than 50% respiratory variability, suggesting right atrial pressure of 8 mmHg. IAS/Shunts: The atrial septum is grossly normal. Additional Comments: There is a small pleural effusion in the left lateral region. Mild ascites is present.  LEFT VENTRICLE PLAX 2D LVIDd:         4.15 cm   Diastology LVIDs:         2.58 cm   LV e' medial:    6.20 cm/s LV PW:         1.09 cm   LV E/e' medial:  16.6 LV IVS:        1.14 cm   LV e' lateral:   9.14 cm/s LVOT diam:     2.01 cm   LV E/e' lateral: 11.3 LV SV:          80 LV SV Index:   43        2D Longitudinal Strain LVOT Area:     3.17 cm  2D Strain GLS (A4C):   -12.6 %                          2D Strain GLS (A3C):   -13.6 %                          2D Strain GLS (A2C):   -12.2 %                          2D Strain GLS Avg:     -12.8 % RIGHT VENTRICLE             IVC RV Basal diam:  3.17 cm     IVC diam: 1.32 cm RV Mid diam:    2.77 cm RV S prime:     21.10 cm/s TAPSE (M-mode): 2.1 cm LEFT ATRIUM             Index        RIGHT ATRIUM           Index LA diam:        4.04 cm 2.17 cm/m   RA Area:     15.70 cm LA Vol (A2C):   52.4 ml 28.12 ml/m  RA Volume:   34.10 ml  18.30 ml/m LA Vol (A4C):   59.9 ml 32.14 ml/m LA Biplane Vol: 56.4 ml 30.27 ml/m  AORTIC VALVE LVOT Vmax:   139.00 cm/s LVOT Vmean:  89.800 cm/s LVOT VTI:    0.252 m  AORTA Ao Root diam: 3.37 cm Ao Asc diam:  3.60 cm MITRAL VALVE                TRICUSPID VALVE MV Area (PHT): 3.19 cm     TR Peak grad:   37.5 mmHg MV Decel Time: 238 msec     TR Vmax:        306.00 cm/s MV E velocity: 103.00 cm/s MV A velocity: 139.00 cm/s  SHUNTS MV E/A ratio:  0.74         Systemic VTI:  0.25 m  Systemic Diam: 2.01 cm Shelda Bruckner MD Electronically signed by Shelda Bruckner MD Signature Date/Time: 08/07/2024/4:53:14 PM    Final    CT ABDOMEN PELVIS W CONTRAST Result Date: 08/03/2024 CLINICAL DATA:  Splenomegaly, right upper quadrant pain EXAM: CT ABDOMEN AND PELVIS WITH CONTRAST TECHNIQUE: Multidetector CT imaging of the abdomen and pelvis was performed using the standard protocol following bolus administration of intravenous contrast. RADIATION DOSE REDUCTION: This exam was performed according to the departmental dose-optimization program which includes automated exposure control, adjustment of the mA and/or kV according to patient size and/or use of iterative reconstruction technique. CONTRAST:  OMNIPAQUE  IOHEXOL  300 MG/ML  SOLN COMPARISON:  08/03/2024, 08/31/2022, 07/18/2007  FINDINGS: Lower chest: Moderate bilateral free-flowing pleural effusions with bilateral lower lobe compressive atelectasis. Central venous catheter tip at the atriocaval junction. Hepatobiliary: The liver is enlarged, without focal parenchymal abnormality. Small gallstones layer dependently within the gallbladder fundus, with no evidence of gallbladder wall thickening on this exam. No biliary duct dilation or choledocholithiasis. Pancreas: Unremarkable. No pancreatic ductal dilatation or surrounding inflammatory changes. Spleen: Marked splenomegaly, measuring 23.9 x 19.3 x 12.1 cm. No focal parenchymal splenic abnormality. Adrenals/Urinary Tract: Nonobstructing 5 mm calculus lower pole right kidney. Nonobstructing 10 mm distal left ureteral calculus just proximal to the UVJ. 9 mm nonobstructing bladder calculus. No obstructive uropathy within either kidney. Kidneys enhance normally. Left kidney is displaced inferiorly due to the enlarged spleen. The adrenals are unremarkable. Stomach/Bowel: Small hiatal hernia. No bowel obstruction or ileus. Normal appendix right lower quadrant. No bowel wall thickening or inflammatory change. Vascular/Lymphatic: The splenic vein, SMV, and portal vein are patent. Portal venous hypertension manifested by enlargement of the portal vein and splenic vein, tortuosity of the splenic vein, and recanalization of the umbilical vein. Atherosclerosis of the abdominal aorta. No pathologic adenopathy. Reproductive: Stable enlargement of the prostate. Other: Trace abdominal and pelvic ascites, greatest in the right upper quadrant. No free intraperitoneal gas. Musculoskeletal: No acute or destructive bony lesions. Reconstructed images demonstrate no additional findings. IMPRESSION: 1. Marked splenomegaly. 2. Cholelithiasis without cholecystitis. 3. Portal venous hypertension manifested by enlarged tortuous portal and splenic veins, recanalization of the umbilical vein, and trace abdominal and  pelvic ascites. 4. Moderate free-flowing bilateral pleural effusions with bilateral lower lobe compressive atelectasis. 5. Small hiatal hernia. 6. Nonobstructing calculi within the right kidney, distal left ureter, and bladder as above. Electronically Signed   By: Ozell Daring M.D.   On: 08/03/2024 17:24   US  Abdomen Limited RUQ (LIVER/GB) Result Date: 08/03/2024 CLINICAL DATA:  Right upper quadrant pain EXAM: ULTRASOUND ABDOMEN LIMITED RIGHT UPPER QUADRANT COMPARISON:  10/25/2023 FINDINGS: Gallbladder: Multiple small shadowing calculi layer dependently within the gallbladder. Borderline gallbladder wall thickening measuring 4 mm. Trace pericholecystic fluid, nonspecific given presence of ascites elsewhere in the right upper quadrant. Negative sonographic Murphy sign. Common bile duct: Diameter: 10 mm Liver: Stable increased liver echotexture most compatible with hepatic steatosis. The liver is enlarged measuring 22.1 cm in the midclavicular line. No intrahepatic biliary duct dilation or focal hepatic lesion. Portal vein is patent, with bidirectional flow noted within the portal vein on Doppler interrogation. Other: Small volume ascites within the right upper quadrant. IMPRESSION: 1. Cholelithiasis, with equivocal evidence of cholecystitis. Mild gallbladder wall thickening and pericholecystic fluid could be due to known history of underlying liver disease and adjacent ascites. If further interrogation is desired, nuclear medicine hepatobiliary scan could be considered. 2. Stable hepatomegaly and increased liver echotexture, consistent with hepatic steatosis. 3. Dilated common bile duct  measuring 10 mm. No evidence of choledocholithiasis or intrahepatic biliary duct dilation. 4. Small volume ascites. 5. Patent portal vein, with interval development of bidirectional flow on Doppler interrogation. Electronically Signed   By: Ozell Daring M.D.   On: 08/03/2024 15:03   DG Chest 2 View Result Date:  08/03/2024 CLINICAL DATA:  Fluid overload. EXAM: CHEST - 2 VIEW COMPARISON:  12/17/2019. FINDINGS: There are bilateral new small pleural effusions. Bilateral lung fields are otherwise clear. No frank pulmonary edema. No pneumothorax on either side. Stable cardio-mediastinal silhouette. No acute osseous abnormalities. Right-sided CT Port-A-Cath is seen with its tip overlying the cavoatrial junction region. The soft tissues are within normal limits. IMPRESSION: Bilateral new small pleural effusions. Electronically Signed   By: Ree Molt M.D.   On: 08/03/2024 13:44   (Echo, Carotid, EGD, Colonoscopy, ERCP)    Subjective: No complaints feels better  Discharge Exam: Vitals:   08/13/24 0454 08/13/24 0721  BP: 114/62 109/70  Pulse: 79 68  Resp: 18 18  Temp: 98.3 F (36.8 C) 98.1 F (36.7 C)  SpO2: 92% 96%   Vitals:   08/13/24 0337 08/13/24 0438 08/13/24 0454 08/13/24 0721  BP: 98/60 116/63 114/62 109/70  Pulse: 76 77 79 68  Resp: 16 18 18 18   Temp: 97.6 F (36.4 C) 97.6 F (36.4 C) 98.3 F (36.8 C) 98.1 F (36.7 C)  TempSrc: Oral Oral Oral Oral  SpO2: 91% 93% 92% 96%  Weight:      Height:        General: Pt is alert, awake, not in acute distress Cardiovascular: RRR, S1/S2 +, no rubs, no gallops Respiratory: CTA bilaterally, no wheezing, no rhonchi Abdominal: Soft, NT, ND, bowel sounds + Extremities: no edema, no cyanosis    The results of significant diagnostics from this hospitalization (including imaging, microbiology, ancillary and laboratory) are listed below for reference.     Microbiology: No results found for this or any previous visit (from the past 240 hours).   Labs: BNP (last 3 results) No results for input(s): BNP in the last 8760 hours. Basic Metabolic Panel: Recent Labs  Lab 08/10/24 1019 08/11/24 0547 08/12/24 0821 08/13/24 0310  NA 135 137 137 137  K 4.6 4.5 3.7 4.2  CL 101 102 101 101  CO2 26 26 26 27   GLUCOSE 172* 155* 172* 125*  BUN  37* 33* 31* 38*  CREATININE 1.21 1.09 1.13 1.27*  CALCIUM  8.5* 8.5* 8.8* 8.2*   Liver Function Tests: Recent Labs  Lab 08/10/24 1019  AST 21  ALT 76*  ALKPHOS 179*  BILITOT 2.1*  PROT 5.1*  ALBUMIN 3.6   No results for input(s): LIPASE, AMYLASE in the last 168 hours. No results for input(s): AMMONIA in the last 168 hours. CBC: Recent Labs  Lab 08/06/24 1250 08/10/24 0733 08/10/24 0733 08/10/24 1019 08/10/24 1907 08/11/24 0547 08/12/24 0821 08/13/24 0310 08/13/24 0905  WBC 8.0 11.2*  --  10.6*  --  10.2 12.8* 11.8*  --   NEUTROABS 3.8 4.7  --  5.5  --   --   --   --   --   HGB 6.5* 5.9*   < > 5.3* 6.2* 7.8* 8.4* 6.9* 8.5*  HCT 19.5* 17.5*  --  17.4* 19.9* 23.8* 26.5* 21.7* 26.2*  MCV 97.5 100.6*  --  104.8*  --  101.3* 101.1* 101.9*  --   PLT 54* 47*  --  55*  --  56* 47* 44*  --    < > =  values in this interval not displayed.   Cardiac Enzymes: No results for input(s): CKTOTAL, CKMB, CKMBINDEX, TROPONINI in the last 168 hours. BNP: Invalid input(s): POCBNP CBG: No results for input(s): GLUCAP in the last 168 hours. D-Dimer No results for input(s): DDIMER in the last 72 hours. Hgb A1c No results for input(s): HGBA1C in the last 72 hours. Lipid Profile No results for input(s): CHOL, HDL, LDLCALC, TRIG, CHOLHDL, LDLDIRECT in the last 72 hours. Thyroid  function studies No results for input(s): TSH, T4TOTAL, T3FREE, THYROIDAB in the last 72 hours.  Invalid input(s): FREET3 Anemia work up No results for input(s): VITAMINB12, FOLATE, FERRITIN, TIBC, IRON , RETICCTPCT in the last 72 hours. Urinalysis    Component Value Date/Time   COLORURINE AMBER (A) 03/28/2024 1447   APPEARANCEUR TURBID (A) 03/28/2024 1447   LABSPEC 1.019 03/28/2024 1447   PHURINE 5.0 03/28/2024 1447   GLUCOSEU NEGATIVE 03/28/2024 1447   HGBUR NEGATIVE 03/28/2024 1447   BILIRUBINUR NEGATIVE 03/28/2024 1447   KETONESUR NEGATIVE 03/28/2024  1447   PROTEINUR NEGATIVE 03/28/2024 1447   NITRITE NEGATIVE 03/28/2024 1447   LEUKOCYTESUR NEGATIVE 03/28/2024 1447   Sepsis Labs Recent Labs  Lab 08/10/24 1019 08/11/24 0547 08/12/24 0821 08/13/24 0310  WBC 10.6* 10.2 12.8* 11.8*   Microbiology No results found for this or any previous visit (from the past 240 hours).   Time coordinating discharge: Over 35 minutes  SIGNED:   Erle Odell Castor, MD  [facility] 08/13/2024, 9:54 AM Pager   If 7PM-7AM, please contact night-coverage [email or web address] Password [email or web address]

## 2024-08-13 NOTE — Progress Notes (Signed)
 Fernando Lee [address] [facility] Liaison Note:  Notified by [facility] manager Glenys Crigler of patient/family request for [facility] Palliative services at home after discharge. Please call with any hospice or outpatient palliative care related questions. Thank you for the opportunity to participate in this patient's care.  Eleanor Nail, LPN [facility] Liaison [phone]

## 2024-08-13 NOTE — TOC Initial Note (Signed)
 Transition of Care [facility]) - Initial/Assessment Note    Patient Details  Name: Fernando Lee MRN: [id number] Date of Birth: 1946/09/17  [facility]) CM/SW Contact:    Doneta Glenys DASEN, RN Phone Number: 08/13/2024, 11:19 AM  Clinical Narrative:                 [facility]. CM introduced and explained role to patient and wife.PTA lives in a house with Lacombe,[name] (Spouse)[phone];verified PCP/insurance;denies DME;HH;oxygen and SDOH needs; Wife will transport home at discharge. CM discussed consult for Home Palliative Care. They had a list and chose [facility] for home palliative services. CM messaged Eleanor Nail referral requested and was accepted. CM informed [facility] that the wife is the contact person. Inpatient care management request that a consult be entered if needs present.  Expected Discharge Plan: Home w Hospice Care (Palliative Care) Barriers to Discharge: Barriers Resolved   Patient Goals and CMS Choice Patient states their goals for this hospitalization and ongoing recovery are:: Home CMS Medicare.gov Compare Post Acute Care list provided to:: Patient Choice offered to / list presented to : Patient, Spouse      Expected Discharge Plan and Services In-house Referral: Hospice / Palliative Care Discharge Planning Services: CM Consult Post Acute Care Choice: Hospice [facility] Care Pattiative Care) Living arrangements for the past 2 months: Single Family Home Expected Discharge Date: 08/13/24               DME Arranged: N/A DME Agency: NA       HH Arranged: NA HH Agency: NA        Prior Living Arrangements/Services Living arrangements for the past 2 months: Single Family Home Lives with:: Significant Other Patient language and need for interpreter reviewed:: Yes Do you feel safe going back to the place where you live?: Yes      Need for Family Participation in Patient Care: Yes (Comment) Care giver support  system in place?: Yes (comment) Current home services:  (NA) Criminal Activity/Legal Involvement Pertinent to Current Situation/Hospitalization: No - Comment as needed  Activities of Daily Living   ADL Screening (condition at time of admission) Independently performs ADLs?: Yes (appropriate for developmental age) Is the patient deaf or have difficulty hearing?: No Does the patient have difficulty seeing, even when wearing glasses/contacts?: No Does the patient have difficulty concentrating, remembering, or making decisions?: No  Permission Sought/Granted Permission sought to share information with : Case Manager, Magazine Features Editor Permission granted to share information with : Yes, Verbal Permission Granted  Share Information with NAME: Sleeth,[name] (Spouse)  [phone]  Permission granted to share info w AGENCY: [facility]        Emotional Assessment Appearance:: Appears stated age Attitude/Demeanor/Rapport: Engaged, Gracious Affect (typically observed): Appropriate Orientation: : Oriented to Self, Oriented to Place, Oriented to  Time, Oriented to Situation Alcohol / Substance Use: Not Applicable Psych Involvement: No (comment)  Admission diagnosis:  Symptomatic anemia [D64.9] Patient Active Problem List   Diagnosis Date Noted   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   Goals of care, counseling/discussion 08/12/2024   Counseling and coordination of care 08/12/2024   Palliative care encounter 08/12/2024   Splenic infarct 08/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   Counseling regarding advance care planning and goals of care 08/11/2024   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   Acute on chronic diastolic congestive heart failure (HCC) 08/03/2024   Hyperbilirubinemia 08/03/2024   Iron  overload 08/03/2024   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   Hardening  of the aorta (main artery of the heart) 09/30/2023   Herpes zoster dermatitis 09/16/2023   Urinary retention 09/16/2023   Splenomegaly  09/02/2022   Anemia 09/01/2022   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   Symptomatic anemia 05/26/2022   Benign neoplasm of colon 08/27/2021   Thrombocytosis 08/27/2021   Screening for malignant neoplasm of prostate 08/27/2021   Leukocytosis 08/27/2021   Impaired fasting glucose 08/27/2021   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   Unspecified abnormal finding in specimens from other organs, systems and tissues 08/27/2021   Nonsustained ventricular tachycardia (HCC) 05/07/2021   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 [address], [place] - [address] [place] [address] Phone: [phone] Fax: [phone]  [facility] [facility] [address] [place] [address] Phone: [phone] Fax: [phone]     Social Drivers of Health (SDOH) Social History: SDOH Screenings   Food Insecurity: No Food Insecurity (08/10/2024)  Housing: Low Risk  (08/10/2024)  Transportation Needs: No Transportation Needs (08/10/2024)  Utilities: Not At Risk (08/10/2024)  Depression (PHQ2-9): Medium Risk (08/10/2024)  Social Connections: Patient Declined (08/10/2024)  Tobacco Use: Medium Risk (08/10/2024)   SDOH Interventions:     Readmission Risk Interventions    08/13/2024   11:16 AM  Readmission Risk Prevention Plan   Transportation Screening Complete  PCP or Specialist Appt within 5-7 Days Complete  Home Care Screening Complete  Medication Review (RN CM) Complete

## 2024-08-13 NOTE — Plan of Care (Signed)

## 2024-08-13 NOTE — Progress Notes (Signed)
 Patient had a Hgb 6.9 this morning.  He states no blood in his urine and not blood from any stools today.  Patient had a previously prepared PRBC to be transfused if Hgb under 7.0.  Blood is being transfused at this time without complications.

## 2024-08-13 NOTE — Progress Notes (Signed)
 PT Cancellation Note  Patient Details Name: Fernando Lee MRN: [id number] DOB: [birth year]   Cancelled Treatment:    Reason Eval/Treat Not Completed: PT screened, no needs identified, will sign off (pt reports he's independent with mobility, he walked in the hall this morning without difficulty. No PT indicated, will sign off.)   Sylvan Delon Copp PT 08/13/2024  Acute Rehabilitation Services  Office [phone]

## 2024-08-14 ENCOUNTER — Other Ambulatory Visit: Payer: Self-pay

## 2024-08-14 DIAGNOSIS — D469 Myelodysplastic syndrome, unspecified: Secondary | ICD-10-CM

## 2024-08-14 LAB — BPAM RBC
Blood Product Expiration Date: 202511192359
Blood Product Expiration Date: 202511192359
ISSUE DATE / TIME: 202510242318
ISSUE DATE / TIME: 202510270432
Unit Type and Rh: 202511192359
Unit Type and Rh: 6200
Unit Type and Rh: 6200

## 2024-08-14 LAB — TYPE AND SCREEN
ABO/RH(D): A POS
Antibody Screen: NEGATIVE
Unit division: 0
Unit division: 0

## 2024-08-16 ENCOUNTER — Inpatient Hospital Stay

## 2024-08-16 DIAGNOSIS — R5383 Other fatigue: Secondary | ICD-10-CM | POA: Diagnosis not present

## 2024-08-16 DIAGNOSIS — D72829 Elevated white blood cell count, unspecified: Secondary | ICD-10-CM | POA: Diagnosis not present

## 2024-08-16 DIAGNOSIS — K55059 Acute (reversible) ischemia of intestine, part and extent unspecified: Secondary | ICD-10-CM | POA: Diagnosis not present

## 2024-08-16 DIAGNOSIS — Z87891 Personal history of nicotine dependence: Secondary | ICD-10-CM | POA: Diagnosis not present

## 2024-08-16 DIAGNOSIS — I5030 Unspecified diastolic (congestive) heart failure: Secondary | ICD-10-CM | POA: Diagnosis not present

## 2024-08-16 DIAGNOSIS — J9811 Atelectasis: Secondary | ICD-10-CM | POA: Diagnosis not present

## 2024-08-16 DIAGNOSIS — D464 Refractory anemia, unspecified: Secondary | ICD-10-CM | POA: Diagnosis present

## 2024-08-16 DIAGNOSIS — R609 Edema, unspecified: Secondary | ICD-10-CM | POA: Diagnosis not present

## 2024-08-16 DIAGNOSIS — M1711 Unilateral primary osteoarthritis, right knee: Secondary | ICD-10-CM | POA: Diagnosis not present

## 2024-08-16 DIAGNOSIS — D75839 Thrombocytosis, unspecified: Secondary | ICD-10-CM | POA: Diagnosis not present

## 2024-08-16 DIAGNOSIS — D469 Myelodysplastic syndrome, unspecified: Secondary | ICD-10-CM

## 2024-08-16 DIAGNOSIS — Z79624 Long term (current) use of inhibitors of nucleotide synthesis: Secondary | ICD-10-CM | POA: Diagnosis not present

## 2024-08-16 DIAGNOSIS — E785 Hyperlipidemia, unspecified: Secondary | ICD-10-CM | POA: Diagnosis not present

## 2024-08-16 DIAGNOSIS — D696 Thrombocytopenia, unspecified: Secondary | ICD-10-CM | POA: Diagnosis not present

## 2024-08-16 DIAGNOSIS — Z8719 Personal history of other diseases of the digestive system: Secondary | ICD-10-CM | POA: Diagnosis not present

## 2024-08-16 DIAGNOSIS — K219 Gastro-esophageal reflux disease without esophagitis: Secondary | ICD-10-CM | POA: Diagnosis not present

## 2024-08-16 DIAGNOSIS — R161 Splenomegaly, not elsewhere classified: Secondary | ICD-10-CM | POA: Diagnosis not present

## 2024-08-16 DIAGNOSIS — R17 Unspecified jaundice: Secondary | ICD-10-CM | POA: Diagnosis not present

## 2024-08-16 DIAGNOSIS — Z87442 Personal history of urinary calculi: Secondary | ICD-10-CM | POA: Diagnosis not present

## 2024-08-16 DIAGNOSIS — Z79899 Other long term (current) drug therapy: Secondary | ICD-10-CM | POA: Diagnosis not present

## 2024-08-16 DIAGNOSIS — Z860100 Personal history of colon polyps, unspecified: Secondary | ICD-10-CM | POA: Diagnosis not present

## 2024-08-16 DIAGNOSIS — I499 Cardiac arrhythmia, unspecified: Secondary | ICD-10-CM | POA: Diagnosis not present

## 2024-08-16 DIAGNOSIS — E782 Mixed hyperlipidemia: Secondary | ICD-10-CM | POA: Diagnosis not present

## 2024-08-16 DIAGNOSIS — D630 Anemia in neoplastic disease: Secondary | ICD-10-CM | POA: Diagnosis not present

## 2024-08-16 DIAGNOSIS — D539 Nutritional anemia, unspecified: Secondary | ICD-10-CM | POA: Diagnosis not present

## 2024-08-16 LAB — CBC WITH DIFFERENTIAL (CANCER CENTER ONLY)
Abs Immature Granulocytes: 0.34 K/uL — ABNORMAL HIGH (ref 0.00–0.07)
Basophils Absolute: 0.3 K/uL — ABNORMAL HIGH (ref 0.0–0.1)
Basophils Relative: 2 %
Eosinophils Absolute: 1.6 K/uL — ABNORMAL HIGH (ref 0.0–0.5)
Eosinophils Relative: 11 %
HCT: 24.6 % — ABNORMAL LOW (ref 39.0–52.0)
Hemoglobin: 8.2 g/dL — ABNORMAL LOW (ref 13.0–17.0)
Immature Granulocytes: 2 %
Lymphocytes Relative: 17 %
Lymphs Abs: 2.4 K/uL (ref 0.7–4.0)
MCH: 32.8 pg (ref 26.0–34.0)
MCHC: 33.3 g/dL (ref 30.0–36.0)
MCV: 98.4 fL (ref 80.0–100.0)
Monocytes Absolute: 3.8 K/uL — ABNORMAL HIGH (ref 0.1–1.0)
Monocytes Relative: 26 %
Neutro Abs: 5.9 K/uL (ref 1.7–7.7)
Neutrophils Relative %: 42 %
Platelet Count: 47 K/uL — ABNORMAL LOW (ref 150–400)
RBC: 2.5 MIL/uL — ABNORMAL LOW (ref 4.22–5.81)
RDW: 21.2 % — ABNORMAL HIGH (ref 11.5–15.5)
WBC Count: 14.3 K/uL — ABNORMAL HIGH (ref 4.0–10.5)
nRBC: 0 % (ref 0.0–0.2)

## 2024-08-16 LAB — SAMPLE TO BLOOD BANK

## 2024-08-16 LAB — TYPE AND SCREEN
ABO/RH(D): A POS
Antibody Screen: NEGATIVE

## 2024-08-20 ENCOUNTER — Other Ambulatory Visit: Payer: Self-pay

## 2024-08-20 ENCOUNTER — Inpatient Hospital Stay: Attending: Hematology

## 2024-08-20 ENCOUNTER — Inpatient Hospital Stay

## 2024-08-20 DIAGNOSIS — D469 Myelodysplastic syndrome, unspecified: Secondary | ICD-10-CM

## 2024-08-20 DIAGNOSIS — Z79899 Other long term (current) drug therapy: Secondary | ICD-10-CM | POA: Insufficient documentation

## 2024-08-20 DIAGNOSIS — D464 Refractory anemia, unspecified: Secondary | ICD-10-CM | POA: Insufficient documentation

## 2024-08-20 LAB — CBC WITH DIFFERENTIAL (CANCER CENTER ONLY)
Abs Immature Granulocytes: 0.3 K/uL — ABNORMAL HIGH (ref 0.00–0.07)
Basophils Absolute: 0.9 K/uL — ABNORMAL HIGH (ref 0.0–0.1)
Basophils Relative: 6 %
Eosinophils Absolute: 1.6 K/uL — ABNORMAL HIGH (ref 0.0–0.5)
Eosinophils Relative: 10 %
HCT: 22.6 % — ABNORMAL LOW (ref 39.0–52.0)
Hemoglobin: 7.5 g/dL — ABNORMAL LOW (ref 13.0–17.0)
Lymphocytes Relative: 9 %
Lymphs Abs: 1.4 K/uL (ref 0.7–4.0)
MCH: 33.2 pg (ref 26.0–34.0)
MCHC: 33.2 g/dL (ref 30.0–36.0)
MCV: 100 fL (ref 80.0–100.0)
Metamyelocytes Relative: 2 %
Monocytes Absolute: 4 K/uL — ABNORMAL HIGH (ref 0.1–1.0)
Monocytes Relative: 26 %
Neutro Abs: 7.3 K/uL (ref 1.7–7.7)
Neutrophils Relative %: 47 %
Platelet Count: 37 K/uL — ABNORMAL LOW (ref 150–400)
RBC: 2.26 MIL/uL — ABNORMAL LOW (ref 4.22–5.81)
RDW: 22.3 % — ABNORMAL HIGH (ref 11.5–15.5)
WBC Count: 15.5 K/uL — ABNORMAL HIGH (ref 4.0–10.5)
nRBC: 0.2 % (ref 0.0–0.2)

## 2024-08-20 LAB — SAMPLE TO BLOOD BANK

## 2024-08-20 LAB — PREPARE RBC (CROSSMATCH)

## 2024-08-20 MED ORDER — ACETAMINOPHEN 325 MG PO TABS
650.0000 mg | ORAL_TABLET | Freq: Once | ORAL | Status: AC
  Administered 2024-08-20: 650 mg via ORAL
  Filled 2024-08-20: qty 2

## 2024-08-20 MED ORDER — METHYLPREDNISOLONE SODIUM SUCC 40 MG IJ SOLR
40.0000 mg | Freq: Once | INTRAMUSCULAR | Status: AC
  Administered 2024-08-20: 40 mg via INTRAVENOUS
  Filled 2024-08-20: qty 1

## 2024-08-20 MED ORDER — SODIUM CHLORIDE 0.9% IV SOLUTION
250.0000 mL | INTRAVENOUS | Status: DC
  Administered 2024-08-20: 100 mL via INTRAVENOUS

## 2024-08-20 NOTE — Patient Instructions (Signed)

## 2024-08-21 LAB — TYPE AND SCREEN
ABO/RH(D): A POS
Antibody Screen: NEGATIVE
Unit division: 0

## 2024-08-21 LAB — BPAM RBC
Blood Product Expiration Date: 202511212359
ISSUE DATE / TIME: 202511031037
Unit Type and Rh: 6200

## 2024-08-22 ENCOUNTER — Other Ambulatory Visit: Payer: Self-pay

## 2024-08-22 DIAGNOSIS — D469 Myelodysplastic syndrome, unspecified: Secondary | ICD-10-CM

## 2024-08-23 ENCOUNTER — Other Ambulatory Visit: Payer: Self-pay

## 2024-08-23 ENCOUNTER — Other Ambulatory Visit (HOSPITAL_COMMUNITY): Payer: Self-pay

## 2024-08-24 ENCOUNTER — Inpatient Hospital Stay

## 2024-08-24 ENCOUNTER — Other Ambulatory Visit: Payer: Self-pay | Admitting: Hematology

## 2024-08-24 ENCOUNTER — Other Ambulatory Visit: Payer: Self-pay

## 2024-08-24 DIAGNOSIS — D469 Myelodysplastic syndrome, unspecified: Secondary | ICD-10-CM

## 2024-08-24 LAB — TYPE AND SCREEN
ABO/RH(D): A POS
Antibody Screen: NEGATIVE

## 2024-08-24 LAB — CBC WITH DIFFERENTIAL (CANCER CENTER ONLY)
Abs Immature Granulocytes: 0.3 K/uL — ABNORMAL HIGH (ref 0.00–0.07)
Band Neutrophils: 2 %
Basophils Absolute: 0.8 K/uL — ABNORMAL HIGH (ref 0.0–0.1)
Basophils Relative: 6 %
Blasts: 2 %
Eosinophils Absolute: 1.1 K/uL — ABNORMAL HIGH (ref 0.0–0.5)
Eosinophils Relative: 8 %
HCT: 24.6 % — ABNORMAL LOW (ref 39.0–52.0)
Hemoglobin: 8.3 g/dL — ABNORMAL LOW (ref 13.0–17.0)
Lymphocytes Relative: 17 %
Lymphs Abs: 2.3 K/uL (ref 0.7–4.0)
MCH: 33.1 pg (ref 26.0–34.0)
MCHC: 33.7 g/dL (ref 30.0–36.0)
MCV: 98 fL (ref 80.0–100.0)
Metamyelocytes Relative: 1 %
Monocytes Absolute: 2.2 K/uL — ABNORMAL HIGH (ref 0.1–1.0)
Monocytes Relative: 16 %
Myelocytes: 1 %
Neutro Abs: 6.6 K/uL (ref 1.7–7.7)
Neutrophils Relative %: 47 %
Platelet Count: 36 K/uL — ABNORMAL LOW (ref 150–400)
RBC: 2.51 MIL/uL — ABNORMAL LOW (ref 4.22–5.81)
RDW: 22.7 % — ABNORMAL HIGH (ref 11.5–15.5)
WBC Count: 13.5 K/uL — ABNORMAL HIGH (ref 4.0–10.5)
nRBC: 0 % (ref 0.0–0.2)

## 2024-08-24 LAB — SAMPLE TO BLOOD BANK

## 2024-08-27 ENCOUNTER — Other Ambulatory Visit: Payer: Self-pay

## 2024-08-27 ENCOUNTER — Inpatient Hospital Stay

## 2024-08-27 ENCOUNTER — Encounter: Payer: Self-pay | Admitting: Hematology

## 2024-08-27 DIAGNOSIS — D469 Myelodysplastic syndrome, unspecified: Secondary | ICD-10-CM

## 2024-08-27 LAB — CBC WITH DIFFERENTIAL (CANCER CENTER ONLY)
Abs Immature Granulocytes: 0.57 K/uL — ABNORMAL HIGH (ref 0.00–0.07)
Basophils Absolute: 0.4 K/uL — ABNORMAL HIGH (ref 0.0–0.1)
Basophils Relative: 3 %
Eosinophils Absolute: 1.9 K/uL — ABNORMAL HIGH (ref 0.0–0.5)
Eosinophils Relative: 11 %
HCT: 24.6 % — ABNORMAL LOW (ref 39.0–52.0)
Hemoglobin: 8.2 g/dL — ABNORMAL LOW (ref 13.0–17.0)
Immature Granulocytes: 3 %
Lymphocytes Relative: 15 %
Lymphs Abs: 2.4 K/uL (ref 0.7–4.0)
MCH: 32.9 pg (ref 26.0–34.0)
MCHC: 33.3 g/dL (ref 30.0–36.0)
MCV: 98.8 fL (ref 80.0–100.0)
Monocytes Absolute: 5 K/uL — ABNORMAL HIGH (ref 0.1–1.0)
Monocytes Relative: 30 %
Neutro Abs: 6.3 K/uL (ref 1.7–7.7)
Neutrophils Relative %: 38 %
Platelet Count: 44 K/uL — ABNORMAL LOW (ref 150–400)
RBC: 2.49 MIL/uL — ABNORMAL LOW (ref 4.22–5.81)
RDW: 23.3 % — ABNORMAL HIGH (ref 11.5–15.5)
WBC Count: 16.6 K/uL — ABNORMAL HIGH (ref 4.0–10.5)
nRBC: 0.3 % — ABNORMAL HIGH (ref 0.0–0.2)

## 2024-08-27 LAB — SAMPLE TO BLOOD BANK

## 2024-08-27 NOTE — Progress Notes (Signed)
 Specialty Pharmacy Refill Coordination Note  Fernando Lee is a 78 y.o. male contacted today regarding refills of specialty medication(s) Momelotinib Dihydrochloride (OJJAARA)   Patient requested [name] at [facility] [place] date: 09/05/24   Medication will be filled on: 09/04/24

## 2024-08-27 NOTE — Progress Notes (Signed)
 Specialty Pharmacy Ongoing Clinical Assessment Note  Fernando Lee is a 78 y.o. male who is being followed by the specialty pharmacy service for RxSp Oncology   Patient's specialty medication(s) reviewed today: Momelotinib Dihydrochloride (OJJAARA)   Missed doses in the last 4 weeks: 0   Patient/Caregiver did not have any additional questions or concerns.   Therapeutic benefit summary: Unable to assess (therapy was recently initiated)   Adverse events/side effects summary: Experienced adverse events/side effects (moderate fatigue)   Patient's therapy is appropriate to: Continue    Goals Addressed             This Visit's Progress    Maintain optimal adherence to therapy   On track    Patient is on track. Patient will maintain adherence         Follow up: 3 months  Silvano LOISE Dolly Specialty Pharmacist

## 2024-08-28 ENCOUNTER — Inpatient Hospital Stay

## 2024-08-30 ENCOUNTER — Other Ambulatory Visit (HOSPITAL_COMMUNITY): Payer: Self-pay

## 2024-08-30 ENCOUNTER — Other Ambulatory Visit: Payer: Self-pay

## 2024-08-30 DIAGNOSIS — D469 Myelodysplastic syndrome, unspecified: Secondary | ICD-10-CM

## 2024-08-31 ENCOUNTER — Inpatient Hospital Stay

## 2024-08-31 ENCOUNTER — Other Ambulatory Visit: Payer: Self-pay

## 2024-08-31 DIAGNOSIS — D469 Myelodysplastic syndrome, unspecified: Secondary | ICD-10-CM

## 2024-08-31 LAB — CBC WITH DIFFERENTIAL (CANCER CENTER ONLY)
Abs Immature Granulocytes: 1.2 K/uL — ABNORMAL HIGH (ref 0.00–0.07)
Band Neutrophils: 2 %
Basophils Absolute: 0.2 K/uL — ABNORMAL HIGH (ref 0.0–0.1)
Basophils Relative: 1 %
Blasts: 1 %
Eosinophils Absolute: 1.2 K/uL — ABNORMAL HIGH (ref 0.0–0.5)
Eosinophils Relative: 6 %
HCT: 24 % — ABNORMAL LOW (ref 39.0–52.0)
Hemoglobin: 8.2 g/dL — ABNORMAL LOW (ref 13.0–17.0)
Lymphocytes Relative: 9 %
Lymphs Abs: 1.7 K/uL (ref 0.7–4.0)
MCH: 34.2 pg — ABNORMAL HIGH (ref 26.0–34.0)
MCHC: 34.2 g/dL (ref 30.0–36.0)
MCV: 100 fL (ref 80.0–100.0)
Metamyelocytes Relative: 4 %
Monocytes Absolute: 4.9 K/uL — ABNORMAL HIGH (ref 0.1–1.0)
Monocytes Relative: 25 %
Myelocytes: 2 %
Neutro Abs: 10.1 K/uL — ABNORMAL HIGH (ref 1.7–7.7)
Neutrophils Relative %: 50 %
Platelet Count: 39 K/uL — ABNORMAL LOW (ref 150–400)
RBC: 2.4 MIL/uL — ABNORMAL LOW (ref 4.22–5.81)
RDW: 25.7 % — ABNORMAL HIGH (ref 11.5–15.5)
WBC Count: 19.4 K/uL — ABNORMAL HIGH (ref 4.0–10.5)
nRBC: 0.6 % — ABNORMAL HIGH (ref 0.0–0.2)

## 2024-09-03 ENCOUNTER — Inpatient Hospital Stay

## 2024-09-03 ENCOUNTER — Other Ambulatory Visit: Payer: Self-pay | Admitting: Hematology

## 2024-09-03 DIAGNOSIS — D469 Myelodysplastic syndrome, unspecified: Secondary | ICD-10-CM

## 2024-09-03 LAB — TYPE AND SCREEN
ABO/RH(D): A POS
Antibody Screen: NEGATIVE

## 2024-09-03 LAB — CBC WITH DIFFERENTIAL (CANCER CENTER ONLY)
Abs Immature Granulocytes: 1.41 K/uL — ABNORMAL HIGH (ref 0.00–0.07)
Basophils Absolute: 0.7 K/uL — ABNORMAL HIGH (ref 0.0–0.1)
Basophils Relative: 2 %
Eosinophils Absolute: 4.4 K/uL — ABNORMAL HIGH (ref 0.0–0.5)
Eosinophils Relative: 13 %
HCT: 25.5 % — ABNORMAL LOW (ref 39.0–52.0)
Hemoglobin: 8.4 g/dL — ABNORMAL LOW (ref 13.0–17.0)
Immature Granulocytes: 4 %
Lymphocytes Relative: 34 %
Lymphs Abs: 11.1 K/uL — ABNORMAL HIGH (ref 0.7–4.0)
MCH: 33.5 pg (ref 26.0–34.0)
MCHC: 32.9 g/dL (ref 30.0–36.0)
MCV: 101.6 fL — ABNORMAL HIGH (ref 80.0–100.0)
Monocytes Absolute: 4 K/uL — ABNORMAL HIGH (ref 0.1–1.0)
Monocytes Relative: 12 %
Neutro Abs: 11.5 K/uL — ABNORMAL HIGH (ref 1.7–7.7)
Neutrophils Relative %: 35 %
Platelet Count: 47 K/uL — ABNORMAL LOW (ref 150–400)
RBC: 2.51 MIL/uL — ABNORMAL LOW (ref 4.22–5.81)
RDW: 27.4 % — ABNORMAL HIGH (ref 11.5–15.5)
WBC Count: 33.1 K/uL — ABNORMAL HIGH (ref 4.0–10.5)
nRBC: 0.5 % — ABNORMAL HIGH (ref 0.0–0.2)

## 2024-09-03 LAB — CMP (CANCER CENTER ONLY)
ALT: 120 U/L — ABNORMAL HIGH (ref 0–44)
AST: 34 U/L (ref 15–41)
Albumin: 3.3 g/dL — ABNORMAL LOW (ref 3.5–5.0)
Alkaline Phosphatase: 365 U/L — ABNORMAL HIGH (ref 38–126)
Anion gap: 5 (ref 5–15)
BUN: 42 mg/dL — ABNORMAL HIGH (ref 8–23)
CO2: 27 mmol/L (ref 22–32)
Calcium: 8.1 mg/dL — ABNORMAL LOW (ref 8.9–10.3)
Chloride: 101 mmol/L (ref 98–111)
Creatinine: 1.58 mg/dL — ABNORMAL HIGH (ref 0.61–1.24)
GFR, Estimated: 44 mL/min — ABNORMAL LOW (ref >60)
Glucose, Bld: 149 mg/dL — ABNORMAL HIGH (ref 70–99)
Potassium: 4.6 mmol/L (ref 3.5–5.1)
Sodium: 133 mmol/L — ABNORMAL LOW (ref 135–145)
Total Bilirubin: 3.3 mg/dL — ABNORMAL HIGH (ref 0.0–1.2)
Total Protein: 4.6 g/dL — ABNORMAL LOW (ref 6.5–8.1)

## 2024-09-03 LAB — FERRITIN: Ferritin: 3201 ng/mL — ABNORMAL HIGH (ref 24–336)

## 2024-09-03 LAB — IRON AND IRON BINDING CAPACITY (CC-WL,HP ONLY)
Iron: 93 ug/dL (ref 45–182)
Saturation Ratios: 92 % — ABNORMAL HIGH (ref 17.9–39.5)
TIBC: 102 ug/dL — ABNORMAL LOW (ref 250–450)
UIBC: 8 ug/dL

## 2024-09-03 LAB — SAMPLE TO BLOOD BANK

## 2024-09-03 MED ORDER — ONDANSETRON HCL 8 MG PO TABS: 8.0000 mg | ORAL_TABLET | Freq: Three times a day (TID) | ORAL | 0 refills | Status: DC | PRN

## 2024-09-03 MED ORDER — PROCHLORPERAZINE MALEATE 10 MG PO TABS: 10.0000 mg | ORAL_TABLET | Freq: Four times a day (QID) | ORAL | 0 refills | Status: DC | PRN

## 2024-09-03 NOTE — Progress Notes (Signed)
 Patient present to infusion for 1 unit pRBC. Hgb 8.4 today, pt does not meet parameters per chart/Dr. Onesimo. Patient de-accessed and discharged home via wheelchair. VSS

## 2024-09-04 ENCOUNTER — Other Ambulatory Visit: Payer: Self-pay

## 2024-09-05 ENCOUNTER — Other Ambulatory Visit: Payer: Self-pay

## 2024-09-05 DIAGNOSIS — D469 Myelodysplastic syndrome, unspecified: Secondary | ICD-10-CM

## 2024-09-06 ENCOUNTER — Inpatient Hospital Stay

## 2024-09-06 ENCOUNTER — Inpatient Hospital Stay: Admitting: Hematology

## 2024-09-06 DIAGNOSIS — D469 Myelodysplastic syndrome, unspecified: Secondary | ICD-10-CM

## 2024-09-06 LAB — SAMPLE TO BLOOD BANK

## 2024-09-06 LAB — CBC WITH DIFFERENTIAL (CANCER CENTER ONLY)
Abs Immature Granulocytes: 2.3 K/uL — ABNORMAL HIGH (ref 0.00–0.07)
Band Neutrophils: 4 %
Basophils Absolute: 2 K/uL — ABNORMAL HIGH (ref 0.0–0.1)
Basophils Relative: 6 %
Blasts: 4 %
Eosinophils Absolute: 1.6 K/uL — ABNORMAL HIGH (ref 0.0–0.5)
Eosinophils Relative: 5 %
HCT: 25.9 % — ABNORMAL LOW (ref 39.0–52.0)
Hemoglobin: 8.6 g/dL — ABNORMAL LOW (ref 13.0–17.0)
Lymphocytes Relative: 9 %
Lymphs Abs: 2.9 K/uL (ref 0.7–4.0)
MCH: 33.5 pg (ref 26.0–34.0)
MCHC: 33.2 g/dL (ref 30.0–36.0)
MCV: 100.8 fL — ABNORMAL HIGH (ref 80.0–100.0)
Metamyelocytes Relative: 5 %
Monocytes Absolute: 6.8 K/uL — ABNORMAL HIGH (ref 0.1–1.0)
Monocytes Relative: 21 %
Myelocytes: 2 %
Neutro Abs: 15.6 K/uL — ABNORMAL HIGH (ref 1.7–7.7)
Neutrophils Relative %: 44 %
Platelet Count: 46 K/uL — ABNORMAL LOW (ref 150–400)
RBC: 2.57 MIL/uL — ABNORMAL LOW (ref 4.22–5.81)
RDW: 28.6 % — ABNORMAL HIGH (ref 11.5–15.5)
WBC Count: 32.6 K/uL — ABNORMAL HIGH (ref 4.0–10.5)
nRBC: 0.8 % — ABNORMAL HIGH (ref 0.0–0.2)

## 2024-09-06 NOTE — Progress Notes (Signed)
 This encounter was created in error - please disregard.

## 2024-09-07 ENCOUNTER — Other Ambulatory Visit: Payer: Self-pay

## 2024-09-07 DIAGNOSIS — D469 Myelodysplastic syndrome, unspecified: Secondary | ICD-10-CM

## 2024-09-07 DIAGNOSIS — D649 Anemia, unspecified: Secondary | ICD-10-CM

## 2024-09-10 ENCOUNTER — Inpatient Hospital Stay

## 2024-09-10 ENCOUNTER — Inpatient Hospital Stay (HOSPITAL_COMMUNITY)
Admission: EM | Admit: 2024-09-10 | Discharge: 2024-09-13 | DRG: 432 | Disposition: A | Discharge status: Hospice | Source: Other Acute Inpatient Hospital | Attending: Internal Medicine | Admitting: Internal Medicine

## 2024-09-10 ENCOUNTER — Emergency Department (HOSPITAL_COMMUNITY)

## 2024-09-10 ENCOUNTER — Other Ambulatory Visit: Payer: Self-pay | Admitting: Hematology

## 2024-09-10 ENCOUNTER — Other Ambulatory Visit (HOSPITAL_COMMUNITY): Payer: Self-pay

## 2024-09-10 ENCOUNTER — Encounter (HOSPITAL_COMMUNITY): Payer: Self-pay | Admitting: Internal Medicine

## 2024-09-10 ENCOUNTER — Other Ambulatory Visit: Payer: Self-pay

## 2024-09-10 DIAGNOSIS — L8990 Pressure ulcer of unspecified site, unspecified stage: Secondary | ICD-10-CM | POA: Insufficient documentation

## 2024-09-10 DIAGNOSIS — Z823 Family history of stroke: Secondary | ICD-10-CM

## 2024-09-10 DIAGNOSIS — Z87442 Personal history of urinary calculi: Secondary | ICD-10-CM

## 2024-09-10 DIAGNOSIS — Z860100 Personal history of colon polyps, unspecified: Secondary | ICD-10-CM

## 2024-09-10 DIAGNOSIS — Z1589 Genetic susceptibility to other disease: Secondary | ICD-10-CM

## 2024-09-10 DIAGNOSIS — D464 Refractory anemia, unspecified: Secondary | ICD-10-CM | POA: Diagnosis present

## 2024-09-10 DIAGNOSIS — K766 Portal hypertension: Secondary | ICD-10-CM | POA: Diagnosis present

## 2024-09-10 DIAGNOSIS — M1711 Unilateral primary osteoarthritis, right knee: Secondary | ICD-10-CM | POA: Diagnosis present

## 2024-09-10 DIAGNOSIS — L89321 Pressure ulcer of left buttock, stage 1: Secondary | ICD-10-CM | POA: Diagnosis present

## 2024-09-10 DIAGNOSIS — Z515 Encounter for palliative care: Secondary | ICD-10-CM

## 2024-09-10 DIAGNOSIS — I3139 Other pericardial effusion (noninflammatory): Secondary | ICD-10-CM | POA: Diagnosis present

## 2024-09-10 DIAGNOSIS — D469 Myelodysplastic syndrome, unspecified: Secondary | ICD-10-CM | POA: Diagnosis present

## 2024-09-10 DIAGNOSIS — R601 Generalized edema: Principal | ICD-10-CM | POA: Diagnosis present

## 2024-09-10 DIAGNOSIS — Z8249 Family history of ischemic heart disease and other diseases of the circulatory system: Secondary | ICD-10-CM

## 2024-09-10 DIAGNOSIS — N179 Acute kidney failure, unspecified: Secondary | ICD-10-CM | POA: Diagnosis present

## 2024-09-10 DIAGNOSIS — R5381 Other malaise: Secondary | ICD-10-CM | POA: Diagnosis present

## 2024-09-10 DIAGNOSIS — L89311 Pressure ulcer of right buttock, stage 1: Secondary | ICD-10-CM | POA: Diagnosis present

## 2024-09-10 DIAGNOSIS — G4733 Obstructive sleep apnea (adult) (pediatric): Secondary | ICD-10-CM | POA: Diagnosis present

## 2024-09-10 DIAGNOSIS — I5032 Chronic diastolic (congestive) heart failure: Secondary | ICD-10-CM | POA: Diagnosis present

## 2024-09-10 DIAGNOSIS — D649 Anemia, unspecified: Secondary | ICD-10-CM

## 2024-09-10 DIAGNOSIS — R7303 Prediabetes: Secondary | ICD-10-CM | POA: Diagnosis present

## 2024-09-10 DIAGNOSIS — Z79899 Other long term (current) drug therapy: Secondary | ICD-10-CM

## 2024-09-10 DIAGNOSIS — C946 Myelodysplastic disease, not classified: Secondary | ICD-10-CM

## 2024-09-10 DIAGNOSIS — I959 Hypotension, unspecified: Secondary | ICD-10-CM | POA: Diagnosis present

## 2024-09-10 DIAGNOSIS — R188 Other ascites: Secondary | ICD-10-CM | POA: Diagnosis present

## 2024-09-10 DIAGNOSIS — N4889 Other specified disorders of penis: Secondary | ICD-10-CM | POA: Diagnosis present

## 2024-09-10 DIAGNOSIS — E871 Hypo-osmolality and hyponatremia: Secondary | ICD-10-CM | POA: Diagnosis present

## 2024-09-10 DIAGNOSIS — E782 Mixed hyperlipidemia: Secondary | ICD-10-CM | POA: Diagnosis present

## 2024-09-10 DIAGNOSIS — Z66 Do not resuscitate: Secondary | ICD-10-CM | POA: Diagnosis present

## 2024-09-10 DIAGNOSIS — E8809 Other disorders of plasma-protein metabolism, not elsewhere classified: Secondary | ICD-10-CM | POA: Diagnosis present

## 2024-09-10 DIAGNOSIS — K767 Hepatorenal syndrome: Secondary | ICD-10-CM | POA: Diagnosis present

## 2024-09-10 DIAGNOSIS — K5900 Constipation, unspecified: Secondary | ICD-10-CM

## 2024-09-10 DIAGNOSIS — D696 Thrombocytopenia, unspecified: Secondary | ICD-10-CM | POA: Diagnosis present

## 2024-09-10 DIAGNOSIS — E739 Lactose intolerance, unspecified: Secondary | ICD-10-CM | POA: Diagnosis present

## 2024-09-10 DIAGNOSIS — K7460 Unspecified cirrhosis of liver: Principal | ICD-10-CM | POA: Diagnosis present

## 2024-09-10 DIAGNOSIS — J918 Pleural effusion in other conditions classified elsewhere: Secondary | ICD-10-CM | POA: Diagnosis present

## 2024-09-10 DIAGNOSIS — M79603 Pain in arm, unspecified: Secondary | ICD-10-CM

## 2024-09-10 DIAGNOSIS — D630 Anemia in neoplastic disease: Secondary | ICD-10-CM | POA: Diagnosis present

## 2024-09-10 DIAGNOSIS — Z87891 Personal history of nicotine dependence: Secondary | ICD-10-CM

## 2024-09-10 LAB — CBC WITH DIFFERENTIAL (CANCER CENTER ONLY)
Abs Immature Granulocytes: 3.2 K/uL — ABNORMAL HIGH (ref 0.00–0.07)
Band Neutrophils: 5 %
Basophils Absolute: 2.3 K/uL — ABNORMAL HIGH (ref 0.0–0.1)
Basophils Relative: 5 %
Blasts: 2 %
Eosinophils Absolute: 3.2 K/uL — ABNORMAL HIGH (ref 0.0–0.5)
Eosinophils Relative: 7 %
HCT: 24.4 % — ABNORMAL LOW (ref 39.0–52.0)
Hemoglobin: 8.2 g/dL — ABNORMAL LOW (ref 13.0–17.0)
Lymphocytes Relative: 5 %
Lymphs Abs: 2.3 K/uL (ref 0.7–4.0)
MCH: 35.2 pg — ABNORMAL HIGH (ref 26.0–34.0)
MCHC: 33.6 g/dL (ref 30.0–36.0)
MCV: 104.7 fL — ABNORMAL HIGH (ref 80.0–100.0)
Metamyelocytes Relative: 5 %
Monocytes Absolute: 14.8 K/uL — ABNORMAL HIGH (ref 0.1–1.0)
Monocytes Relative: 32 %
Myelocytes: 2 %
Neutro Abs: 19.5 K/uL — ABNORMAL HIGH (ref 1.7–7.7)
Neutrophils Relative %: 37 %
Platelet Count: 38 K/uL — ABNORMAL LOW (ref 150–400)
RBC: 2.33 MIL/uL — ABNORMAL LOW (ref 4.22–5.81)
WBC Count: 46.4 K/uL — ABNORMAL HIGH (ref 4.0–10.5)
nRBC: 1.4 % — ABNORMAL HIGH (ref 0.0–0.2)

## 2024-09-10 LAB — CBC WITH DIFFERENTIAL/PLATELET
Abs Immature Granulocytes: 2.9 K/uL — ABNORMAL HIGH (ref 0.00–0.07)
Basophils Absolute: 2.1 K/uL — ABNORMAL HIGH (ref 0.0–0.1)
Basophils Relative: 5 %
Eosinophils Absolute: 3.4 K/uL — ABNORMAL HIGH (ref 0.0–0.5)
Eosinophils Relative: 8 %
HCT: 24.1 % — ABNORMAL LOW (ref 39.0–52.0)
Hemoglobin: 7.9 g/dL — ABNORMAL LOW (ref 13.0–17.0)
Lymphocytes Relative: 8 %
Lymphs Abs: 3.4 K/uL (ref 0.7–4.0)
MCH: 35.3 pg — ABNORMAL HIGH (ref 26.0–34.0)
MCHC: 32.8 g/dL (ref 30.0–36.0)
MCV: 107.6 fL — ABNORMAL HIGH (ref 80.0–100.0)
Metamyelocytes Relative: 1 %
Monocytes Absolute: 10.5 K/uL — ABNORMAL HIGH (ref 0.1–1.0)
Monocytes Relative: 25 %
Myelocytes: 6 %
Neutro Abs: 15.1 K/uL — ABNORMAL HIGH (ref 1.7–7.7)
Neutrophils Relative %: 36 %
Other: 11 %
Platelets: 36 K/uL — ABNORMAL LOW (ref 150–400)
RBC: 2.24 MIL/uL — ABNORMAL LOW (ref 4.22–5.81)
WBC: 41.9 K/uL — ABNORMAL HIGH (ref 4.0–10.5)
nRBC: 1.6 % — ABNORMAL HIGH (ref 0.0–0.2)

## 2024-09-10 LAB — RESP PANEL BY RT-PCR (RSV, FLU A&B, COVID)  RVPGX2
Influenza A by PCR: NEGATIVE
Influenza B by PCR: NEGATIVE
Resp Syncytial Virus by PCR: NEGATIVE
SARS Coronavirus 2 by RT PCR: NEGATIVE

## 2024-09-10 LAB — COMPREHENSIVE METABOLIC PANEL WITH GFR
ALT: 145 U/L — ABNORMAL HIGH (ref 0–44)
AST: 48 U/L — ABNORMAL HIGH (ref 15–41)
Albumin: 2.7 g/dL — ABNORMAL LOW (ref 3.5–5.0)
Alkaline Phosphatase: 482 U/L — ABNORMAL HIGH (ref 38–126)
Anion gap: 9 (ref 5–15)
BUN: 50 mg/dL — ABNORMAL HIGH (ref 8–23)
CO2: 23 mmol/L (ref 22–32)
Calcium: 7.8 mg/dL — ABNORMAL LOW (ref 8.9–10.3)
Chloride: 97 mmol/L — ABNORMAL LOW (ref 98–111)
Creatinine, Ser: 1.73 mg/dL — ABNORMAL HIGH (ref 0.61–1.24)
GFR, Estimated: 39 mL/min — ABNORMAL LOW (ref >60)
Glucose, Bld: 128 mg/dL — ABNORMAL HIGH (ref 70–99)
Potassium: 5 mmol/L (ref 3.5–5.1)
Sodium: 129 mmol/L — ABNORMAL LOW (ref 135–145)
Total Bilirubin: 3.2 mg/dL — ABNORMAL HIGH (ref 0.0–1.2)
Total Protein: 4.4 g/dL — ABNORMAL LOW (ref 6.5–8.1)

## 2024-09-10 LAB — SAMPLE TO BLOOD BANK

## 2024-09-10 LAB — PREPARE RBC (CROSSMATCH)

## 2024-09-10 LAB — PRO BRAIN NATRIURETIC PEPTIDE: Pro Brain Natriuretic Peptide: 668 pg/mL — ABNORMAL HIGH (ref <300.0)

## 2024-09-10 LAB — HEMOGLOBIN AND HEMATOCRIT, BLOOD
HCT: 24.1 % — ABNORMAL LOW (ref 39.0–52.0)
Hemoglobin: 7.9 g/dL — ABNORMAL LOW (ref 13.0–17.0)

## 2024-09-10 LAB — PATHOLOGIST SMEAR REVIEW

## 2024-09-10 MED ORDER — VALACYCLOVIR HCL 500 MG PO TABS
500.0000 mg | ORAL_TABLET | Freq: Every morning | ORAL | Status: DC
  Administered 2024-09-11: 500 mg via ORAL
  Filled 2024-09-10: qty 1

## 2024-09-10 MED ORDER — ONDANSETRON HCL 4 MG PO TABS
4.0000 mg | ORAL_TABLET | Freq: Four times a day (QID) | ORAL | Status: DC | PRN
  Filled 2024-09-10: qty 1

## 2024-09-10 MED ORDER — METHYLPREDNISOLONE SODIUM SUCC 40 MG IJ SOLR
40.0000 mg | Freq: Once | INTRAMUSCULAR | Status: AC
  Administered 2024-09-11: 40 mg via INTRAVENOUS
  Filled 2024-09-10: qty 1

## 2024-09-10 MED ORDER — TRAZODONE HCL 50 MG PO TABS
25.0000 mg | ORAL_TABLET | Freq: Every evening | ORAL | Status: DC | PRN
  Administered 2024-09-11 – 2024-09-12 (×2): 25 mg via ORAL
  Filled 2024-09-10 (×2): qty 1

## 2024-09-10 MED ORDER — SODIUM CHLORIDE 0.9% IV SOLUTION: 250.0000 mL | INTRAVENOUS | Status: DC

## 2024-09-10 MED ORDER — ALFUZOSIN HCL ER 10 MG PO TB24
10.0000 mg | ORAL_TABLET | Freq: Every day | ORAL | Status: DC
  Administered 2024-09-11 – 2024-09-12 (×3): 10 mg via ORAL
  Filled 2024-09-10 (×3): qty 1

## 2024-09-10 MED ORDER — DOCUSATE SODIUM 100 MG PO CAPS
100.0000 mg | ORAL_CAPSULE | Freq: Every day | ORAL | Status: DC
  Administered 2024-09-11 – 2024-09-13 (×3): 100 mg via ORAL
  Filled 2024-09-10 (×3): qty 1

## 2024-09-10 MED ORDER — ACETAMINOPHEN 325 MG PO TABS
650.0000 mg | ORAL_TABLET | Freq: Four times a day (QID) | ORAL | Status: DC | PRN
Start: 2024-09-10 — End: 2024-09-13
  Administered 2024-09-11 – 2024-09-13 (×4): 650 mg via ORAL
  Filled 2024-09-10 (×4): qty 2

## 2024-09-10 MED ORDER — CHLORHEXIDINE GLUCONATE CLOTH 2 % EX PADS
6.0000 | MEDICATED_PAD | Freq: Every day | CUTANEOUS | Status: DC
  Administered 2024-09-11 – 2024-09-13 (×4): 6 via TOPICAL

## 2024-09-10 MED ORDER — ACETAMINOPHEN 325 MG PO TABS
650.0000 mg | ORAL_TABLET | Freq: Once | ORAL | Status: AC
  Administered 2024-09-11: 650 mg via ORAL
  Filled 2024-09-10: qty 2

## 2024-09-10 MED ORDER — ONDANSETRON HCL 4 MG/2ML IJ SOLN
4.0000 mg | Freq: Four times a day (QID) | INTRAMUSCULAR | Status: DC | PRN
  Filled 2024-09-10: qty 2

## 2024-09-10 MED ORDER — ACETAMINOPHEN 650 MG RE SUPP: 650.0000 mg | Freq: Four times a day (QID) | RECTAL | Status: DC | PRN

## 2024-09-10 MED ORDER — ALBUTEROL SULFATE (2.5 MG/3ML) 0.083% IN NEBU: 2.5000 mg | INHALATION_SOLUTION | RESPIRATORY_TRACT | Status: DC | PRN

## 2024-09-10 MED ORDER — SODIUM CHLORIDE 0.9% FLUSH
10.0000 mL | INTRAVENOUS | Status: DC | PRN
  Administered 2024-09-13: 10 mL

## 2024-09-10 MED ORDER — ALBUMIN HUMAN 25 % IV SOLN
25.0000 g | Freq: Once | INTRAVENOUS | Status: AC
  Administered 2024-09-10: 25 g via INTRAVENOUS
  Filled 2024-09-10: qty 100

## 2024-09-10 MED ORDER — MOMELOTINIB DIHYDROCHLORIDE 100 MG PO TABS: 100.0000 mg | ORAL_TABLET | Freq: Every day | ORAL | Status: DC

## 2024-09-10 MED ORDER — FUROSEMIDE 10 MG/ML IJ SOLN
20.0000 mg | Freq: Once | INTRAMUSCULAR | Status: AC
  Administered 2024-09-10: 20 mg via INTRAVENOUS
  Filled 2024-09-10: qty 4

## 2024-09-10 NOTE — H&P (Signed)
 History and Physical  Fernando Lee FMW:[id number] DOB: [birth year] DOA: 09/10/2024  PCP: Dayna Motto, DO   Chief Complaint: Worsening lower extremity edema, penile edema, splenic pain  HPI: Fernando Lee is a 78 y.o. 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  He was hospitalized recently at the end of October with symptomatic anemia, requiring blood transfusion.  During that hospitalization, he was evaluated by oncology as well as the palliative care team but the patient was not ready to consider hospice.  During that hot physician, he did request DNR/DNI status and confirms today that this should remain unchanged.  In the interim, he has been followed by hematology and receives intermittent blood transfusions.  He tells me that for the last couple weeks, he has developed a nonproductive cough, gradually worsening left upper abdominal discomfort especially when he is trying to sleep on his side, as well as worsening lower extremity pitting edema which has progressed all the way up to his hips including his scrotum and penis.  He denies any fevers, nausea, diarrhea, chest pain, shortness of breath at rest, or significant orthopnea.  No bleeding.  Review of Systems: Please see HPI for pertinent positives and negatives. A complete 10 system review of systems are otherwise negative.  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 His smoking use included cigarettes. He has never used smokeless tobacco. He reports current alcohol use of about 1.0 standard drink of alcohol per week. No history on file for drug use.  Allergies  Allergen Reactions   Lactose Intolerance (Gi) Diarrhea    Family History  Problem Relation Age of Onset   Stroke Brother    Congestive Heart Failure Brother      Prior to Admission medications   Medication Sig Start Date End Date Taking? Authorizing Provider  alfuzosin  (UROXATRAL ) 10 MG 24 hr tablet Take 10 mg by mouth at bedtime.   Yes [provider]  Ascorbic Acid  (VITAMIN C  PO) Take 1 tablet by mouth daily.   Yes [provider]  B Complex Vitamins (B COMPLEX PO) Take 1 tablet by mouth daily.   Yes [provider]  Cholecalciferol  (VITAMIN D -3 PO)  Take 1 capsule by mouth daily.   Yes [provider]  docusate sodium  (COLACE) 100 MG capsule Take 100 mg by mouth daily.   Yes [provider]  Flaxseed, Linseed, (FLAXSEED OIL PO) Take 1 capsule by mouth daily.   Yes [provider]  furosemide  (LASIX ) 20 MG tablet Take 1 tablet by mouth once daily Patient taking differently: Take 20 mg by mouth See admin instructions. Take 20 mg by mouth in the morning and at lunchtime 08/27/24  Yes Kale, Emaline Brink, MD  Misc Natural Products (TURMERIC, CURCUMIN, PO) Take 1 capsule  by mouth daily.   Yes [provider]  momelotinib dihydrochloride  (OJJAARA ) 100 MG tablet Take 1 tablet (100 mg total) by mouth daily. 08/04/24  Yes Onesimo Emaline Brink, MD  ondansetron  (ZOFRAN ) 8 MG tablet Take 1 tablet (8 mg total) by mouth every 8 (eight) hours as needed for nausea or vomiting. 09/03/24  Yes Onesimo Emaline Brink, MD  potassium chloride  SA (KLOR-CON  M) 20 MEQ tablet Take 1 tablet by mouth twice daily Patient taking differently: Take 10 mEq by mouth See admin instructions. Take 10 mEq by mouth with only the morning dose of Furosemide  07/24/24  Yes Onesimo Emaline Brink, MD  prochlorperazine  (COMPAZINE ) 10 MG tablet Take 1 tablet (10 mg total) by mouth every 6 (six) hours as needed for nausea or vomiting. 09/03/24  Yes Onesimo Emaline Brink, MD  TYLENOL  500 MG tablet Take 250-500 mg by mouth See admin instructions. Take 500 mg by mouth in the morning & at bedtime and an additional 250 mg twice a day   Yes [provider]  valACYclovir  (VALTREX ) 500 MG tablet Take 1 tablet (500 mg total) by mouth 2 (two) times daily. Patient taking differently: Take 500 mg by mouth in the morning. 04/18/24  Yes Onesimo Emaline Brink, MD  acetaminophen  (TYLENOL ) 325 MG tablet Take 2 tablets (650 mg total) by mouth every 6 (six) hours as needed for mild pain (pain score 1-3) (or Fever >/= 101). Patient not taking: Reported on 09/10/2024 09/20/23   Lee, Fernando A, MD  Deferasirox  (JADENU ) 180 MG TABS Take 2 tablets (360 mg total) by mouth daily. Take on an empty stomach. Patient not taking: Reported on 08/11/2024 05/18/24   Onesimo Emaline Brink, MD    Physical Exam: BP (!) 83/53   Pulse 88   Temp 97.6 F (36.4 C) (Oral)   Resp 19   Ht 5' 7 (1.702 m)   Wt 78 kg   SpO2 93%   BMI 26.94 kg/m  General:  Alert, oriented, calm, in no acute distress, resting comfortably on room air but appears chronically ill.  He has massive anasarca. Cardiovascular: RRR, no murmurs or rubs, 4+  pitting edema in the bilateral lower extremities up to the hips Respiratory: clear to auscultation bilaterally, no wheezes, no crackles, but breath sounds are diminished at the bilateral bases Abdomen: soft, tender over his palpable and enlarged spleen, nondistended, normal bowel tones heard  Skin: dry, no rashes  Musculoskeletal: no joint effusions, normal range of motion  Psychiatric: appropriate affect, normal speech  Neurologic: extraocular muscles intact, clear speech, moving all extremities with intact sensorium         Labs on Admission:  Basic Metabolic Panel: Recent Labs  Lab 09/10/24 1206  NA 129*  K 5.0  CL 97*  CO2 23  GLUCOSE 128*  BUN 50*  CREATININE 1.73*  CALCIUM  7.8*   Liver Function Tests: Recent Labs  Lab 09/10/24  1206  AST 48*  ALT 145*  ALKPHOS 482*  BILITOT 3.2*  PROT 4.4*  ALBUMIN  2.7*   No results for input(s): LIPASE, AMYLASE in the last 168 hours. No results for input(s): AMMONIA in the last 168 hours. CBC: Recent Labs  Lab 09/06/24 0801 09/10/24 0953 09/10/24 1206  WBC 32.6* 46.4* 41.9*  NEUTROABS 15.6* 19.5* 15.1*  HGB 8.6* 8.2* 7.9*  HCT 25.9* 24.4* 24.1*  MCV 100.8* 104.7* 107.6*  PLT 46* 38* 36*   Cardiac Enzymes: No results for input(s): CKTOTAL, CKMB, CKMBINDEX, TROPONINI in the last 168 hours. BNP (last 3 results) No results for input(s): BNP in the last 8760 hours.  ProBNP (last 3 results) Recent Labs    08/03/24 1444 08/10/24 1019 09/10/24 1206  PROBNP 481.0* 613.0* 668.0*    CBG: No results for input(s): GLUCAP in the last 168 hours.  Radiological Exams on Admission: CT ABDOMEN PELVIS WO CONTRAST Result Date: 09/10/2024 CLINICAL DATA:  Abdominal distension, pain, ascites. Recent splenic infarct. EXAM: CT ABDOMEN AND PELVIS WITHOUT CONTRAST TECHNIQUE: Multidetector CT imaging of the abdomen and pelvis was performed following the standard protocol without IV contrast. RADIATION DOSE REDUCTION:  This exam was performed according to the departmental dose-optimization program which includes automated exposure control, adjustment of the mA and/or kV according to patient size and/or use of iterative reconstruction technique. COMPARISON:  08/10/2024. FINDINGS: Lower chest: Moderate right and large left pleural effusions with compressive atelectasis in both lower lobes. New peribronchovascular ground-glass bilaterally. Central line is seen in the SVC, terminating in the high right atrium. Partially imaged low-attenuation lesion in the prevascular space measures 2.3 x 2.6 cm. Small pericardial effusion. Heart is enlarged. Atherosclerotic calcification of the aorta, aortic valve and coronary arteries. Distal esophagus is grossly unremarkable. Hepatobiliary: Liver is diffusely increased in density and is enlarged, measuring 26.6 cm. Margin is slightly irregular. Gallstones. No biliary ductal dilatation. Pancreas: Negative. Spleen: Enlarged, 20.8 cm. Adrenals/Urinary Tract: Adrenal glands are unremarkable. Small bilateral renal stones. Kidneys are otherwise grossly unremarkable. Ureters are decompressed. 8 mm stone at the left ureterovesical junction. 8 mm dependent stone in the bladder. Stomach/Bowel: Moderate hiatal hernia. Stomach, small bowel and colon are otherwise unremarkable. Appendix is not readily visualized. Vascular/Lymphatic: Atherosclerotic calcification of the aorta. Gastroesophageal varices. Retroperitoneal lymph nodes measure up to 9 mm in the aortocaval station (2/51). Reproductive: Prostate is enlarged. Other: Large ascites.  Diffuse body wall edema. Musculoskeletal: Degenerative changes in the spine. IMPRESSION: 1. 8 mm stone at the left ureterovesical junction without hydronephrosis. 2. Moderate right and large left pleural effusions with compressive atelectasis in both lower lobes. New peribronchovascular ground-glass suggests pulmonary edema. 3. Small pericardial effusion. 4. Large ascites. 5.  Cirrhosis with portal hypertension. Increased density of the liver can be seen with iron  deposition disease. 6. Cholelithiasis. 7. Small bilateral renal stones. 8. Moderate hiatal hernia. 9. 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 If further evaluation is desired, nonemergent CT chest without and with contrast should be considered. 10. Enlarged prostate. 11. Aortic atherosclerosis (ICD10-I70.0). Coronary artery calcification. Electronically Signed   By: Newell Eke M.D.   On: 09/10/2024 16:08   DG Chest Port 1 View Result Date: 09/10/2024 CLINICAL DATA:  Shortness of breath. EXAM: PORTABLE CHEST 1 VIEW COMPARISON:  Chest CT dated 08/03/2024 FINDINGS: Right-sided Port-A-Cath with tip over the right atrium. There is cardiomegaly with vascular congestion and edema. Small bilateral pleural effusions and bibasilar atelectasis. No pneumothorax. No acute osseous pathology. IMPRESSION:  Cardiomegaly with findings of CHF. Electronically Signed   By: Vanetta Chou M.D.   On: 09/10/2024 13:00   Assessment/Plan Fernando Lee is a 78 y.o. 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   Anasarca-likely due to low albumin , poor nutritional status, and portal hypertension from his significant splenomegaly related to sequestration from his MDS.  Patient is stable but has very low albumin , and is hypotensive.  He has a large left and moderate right-sided pleural effusion, as well as a large amount of ascites in his abdomen. -Inpatient admission -Monitor closely on progressive -Will give a dose of 25 g albumin  now, and attempt diuresis with IV Lasix  if blood pressure stabilizes with albumin  -IR  consult for therapeutic thoracentesis and/or paracentesis, will also obtain culture on his ascites fluid.  However the patient does not have pain, fever or other signs or symptoms of SBP.  Heart failure with preserved EF-patient has known grade 2 diastolic dysfunction, was taking Lasix  20 mg p.o. daily at home, this was recently increased.  Acute kidney injury-possibly due to developing hepatorenal disease, or due to recent increase in his diuretic.  He is also hypotensive and anemic which could be contributing to renal injury.  MDS/JAK2 mutation-causing his chronic anemia and thrombocytopenia which are stable, he also has worsening chronic leukocytosis related to his disease -Avoid blood thinners, monitor blood counts daily -Transfuse as indicated -Secure chat message sent to Dr. Onesimo to notify of hospital admission  Goals of care-patient was seen by palliative care as an inpatient during his last admission, oncology also broached the topic of hospice with the patient was not prepared to pursue comfort care at that time.  Palliative care has been consulted once again for further goals of care discussions.  DVT prophylaxis: SCDs only due to thrombocytopenia    Code Status: Limited: Do not attempt resuscitation (DNR) -DNR-LIMITED -Do Not Intubate/DNI   Consults called: Dr. Onesimo added to inpatient treatment team  Admission status: The appropriate patient status for this patient is INPATIENT. Inpatient status is judged to be reasonable and necessary in order to provide the required intensity of service to ensure the patient's safety. The patient's presenting symptoms, physical exam findings, and initial radiographic and laboratory data in the context of their chronic comorbidities is felt to place them at high risk for further clinical deterioration. Furthermore, it is not anticipated that the patient will be medically stable for discharge from the hospital within 2 midnights of admission.    I  certify that at the point of admission it is my clinical judgment that the patient will require inpatient hospital care spanning beyond 2 midnights from the point of admission due to high intensity of service, high risk for further deterioration and high frequency of surveillance required  Time spent: 59 minutes  Teagon Kron CHRISTELLA Gail MD [facility] Pager [phone]  If 7PM-7AM, please contact night-coverage [email or web address] Password [email or web address]  09/10/2024, 4:02 PM

## 2024-09-10 NOTE — ED Provider Notes (Signed)
 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  Patient has progressively worsening swelling of his abdomen genitals lower extremities over the past several weeks.  He has also been having increased shortness of breath, low blood pressure generalized weakness.  Patient was supposed to receive a scheduled blood transfusion this morning at the cancer center but was sent in due to his apparent volume overload.  He has been on 40 mg of Lasix  daily without any improvement.  His doctor told him to take 60 mg a day for a few days but his wife reports that his kidneys hurt so he stopped taking that high dose and went back to 40.  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 Authorizing Provider  acetaminophen  (TYLENOL ) 325 MG tablet Take 2 tablets (650 mg total) by mouth every 6 (six) hours as needed for mild pain (pain score 1-3) (or Fever >/= 101). 09/20/23   Regalado, Belkys A, MD  alfuzosin  (UROXATRAL ) 10 MG 24 hr tablet Take 10 mg by mouth at bedtime.    [provider]  Ascorbic Acid  (VITAMIN C  PO) Take 1 tablet by mouth daily.    [provider]  B Complex Vitamins (B COMPLEX PO) Take 1 tablet by mouth daily.    [provider]  Cholecalciferol  (VITAMIN D -3 PO) Take 1 capsule by mouth daily.    [provider]  Deferasirox  (JADENU ) 180 MG TABS Take 2 tablets (360 mg  total) by mouth daily. Take on an empty stomach. Patient not taking: Reported on 08/11/2024 05/18/24   Onesimo Emaline Brink, MD  docusate sodium  (COLACE) 100 MG capsule Take 100 mg by mouth daily.    [provider]  FIBER PO Take 1 capsule by mouth daily.    [provider]  Flaxseed, Linseed, (FLAXSEED OIL PO) Take 1 capsule by mouth daily.    [provider]  furosemide  (LASIX ) 20 MG tablet Take 1 tablet by mouth once daily 08/27/24   Kale, Gautam Kishore, MD  Misc Natural Products (TURMERIC, CURCUMIN, PO) Take 1 tablet by mouth daily.    [provider]  momelotinib dihydrochloride  (OJJAARA ) 100 MG tablet Take 1 tablet (100 mg total) by mouth daily. 08/04/24   Onesimo Emaline Brink, MD  ondansetron  (ZOFRAN ) 8 MG tablet Take 1 tablet (8 mg total) by mouth every 8 (eight) hours as needed for nausea or vomiting. 09/03/24   Onesimo Emaline Brink, MD  potassium chloride  SA (KLOR-CON  M) 20 MEQ tablet Take 1 tablet by mouth twice daily Patient taking differently: Take 10 mEq by mouth daily. 07/24/24   Onesimo Emaline Brink, MD  prochlorperazine  (COMPAZINE ) 10 MG tablet Take 1 tablet (10 mg total) by mouth every 6 (six) hours as needed for nausea or vomiting. 09/03/24   Onesimo Emaline Brink, MD  valACYclovir  (VALTREX ) 500 MG tablet Take 1 tablet (500 mg total) by mouth 2 (two)  times daily. 04/18/24   Onesimo Emaline Brink, MD    Allergies: Lactose intolerance (gi)    Review of Systems  Respiratory:  Positive for shortness of breath.   Gastrointestinal:  Positive for abdominal pain.    Updated Vital Signs BP (!) 88/56 (BP Location: Right Arm)   Pulse 83   Temp 97.6 F (36.4 C) (Oral)   Resp 16   Ht 5' 7 (1.702 m)   Wt 78 kg   SpO2 94%   BMI 26.94 kg/m   Physical Exam Vitals and nursing note reviewed.  Constitutional:      General: He is not in acute distress.    Appearance: He is well-developed. He is ill-appearing. He is not diaphoretic.     Comments:  Sallow skin  HENT:     Head: Normocephalic and atraumatic.  Eyes:     General: No scleral icterus.    Conjunctiva/sclera: Conjunctivae normal.  Cardiovascular:     Rate and Rhythm: Normal rate and regular rhythm.     Heart sounds: Normal heart sounds.  Pulmonary:     Effort: Pulmonary effort is normal. No respiratory distress.     Breath sounds: Examination of the right-lower field reveals rales. Examination of the left-lower field reveals rales. Rales present.  Abdominal:     General: There is distension.     Palpations: Abdomen is soft. There is fluid wave.     Tenderness: There is no abdominal tenderness.  Musculoskeletal:     Cervical back: Normal range of motion and neck supple.     Right lower leg: Edema present.     Left lower leg: Edema present.     Comments: Anasarca with edema up to the diaphragm  Skin:    General: Skin is warm and dry.  Neurological:     Mental Status: He is alert.  Psychiatric:        Behavior: Behavior normal.     (all labs ordered are listed, but only abnormal results are displayed) Labs Reviewed  RESP PANEL BY RT-PCR (RSV, FLU A&B, COVID)  RVPGX2  PRO BRAIN NATRIURETIC PEPTIDE  CBC WITH DIFFERENTIAL/PLATELET  COMPREHENSIVE METABOLIC PANEL WITH GFR    EKG: None  Radiology: No results found.   Procedures   Medications Ordered in the ED - No data to display  Clinical Course as of 09/10/24 1344  Mon Sep 10, 2024  1253 Sodium(!): 129 [AH]  1253 Creatinine(!): 1.73 [AH]  1253 GFR, Estimated(!): 39 Sodium 129 significantly decreased likely dilutional. Creatinine and GFR also significantly worsened may be due to his Lasix  use. [AH]  1254 AST(!): 48 [AH]  1254 ALT(!): 145 [AH]  1254 Alkaline Phosphatase(!): 482 AST ALT and alk phos remain stable [AH]  1254 Albumin (!): 2.7 Patient's albumin  has dropped significantly over the past several days. [AH]  1254 CBC with Differential(!) CBC is existing and effectively abnormal however this  appears to be baseline for the patient with his myelodysplastic disorder white blood cell count 41,000, hemoglobin 8 and platelets 36,000. [AH]    Clinical Course User Index [AH] Arloa Chroman, PA-C                                 Medical Decision Making This patient presents to the ED for concern of peripheral edema, abdominal swelling , SOB, this involves an extensive number of treatment options, and is a complaint that carries with it a high risk of complications  and morbidity.  DDX includes, renal failure, hepatic failure, Portal Hypertension, Right sided H heart failure.  Co morbidities:      MDS, known splenomegaly hepatomegaly and portal hypertension  Social Determinants of Health:        SDOH Screenings Food Insecurity: No Food Insecurity (08/10/2024) Housing: Low Risk  (08/10/2024) Transportation Needs: No Transportation Needs (08/10/2024) Utilities: Not At Risk (08/10/2024) Depression (PHQ2-9): Low Risk  (08/20/2024) Recent Concern: Depression (PHQ2-9) - Medium Risk (08/10/2024) Social Connections: Patient Declined (08/10/2024) Tobacco Use: Medium Risk (08/10/2024)   Additional history:  {Additional history obtained from wife at bedside  {External records from outside source obtained and reviewed including previous oncology notes.  Also EMR notes from previous ED visits and previous CT imaging  Lab Tests:  I Ordered, and personally interpreted labs.  The pertinent results include: As per ED course  Imaging Studies: Noncontrast CT abdomen and pelvis pending Cardiac Monitoring/ECG:       The patient was maintained on a cardiac monitor.  I personally viewed and interpreted the cardiac monitored which showed an underlying rhythm of:   Medicines ordered and prescription drug management:  I considered IV Lasix  however due to patient's hypotension low albumin  and third spacing of fluids did not want to cause further hypotension. Test Considered:          Critical Interventions:         Consultations Obtained: Case discussed with Dr. 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  Previous splenic infarct seen 1 month ago.  He currently has a noncontrast CT pending as he has worsening in his renal function which I suspect is due to his increased Lasix  use.  He has persistent hypotension likely due to third spacing of his fluid and hypoalbuminemia which is progressively worsening.  He has no known history of cirrhosis previous imaging does not show any evidence of this and it seems to be all related to his portal hypertension.  Patient is currently anasarcic.  He would benefit from fluid stabilization however his case is complicated by his hypotension.  All of this was discussed with Dr. Zella who will admit the patient for further management.   Dispostion:  After consideration of the diagnostic results and the patients response to treatment, I feel that the patent would benefit from admission.    Amount and/or Complexity of Data Reviewed Labs: ordered. Decision-making details documented in ED Course. Radiology: ordered.  Risk Decision regarding hospitalization.    BP (!) 88/56 (BP Location: Right Arm)   Pulse 83   Temp 97.6 F (36.4 C) (Oral)   Resp 16   Ht 5' 7 (1.702 m)   Wt 78 kg   SpO2 94%   BMI 26.94 kg/m   Patient flowsheet vitals as below- vitals appear to be baseline   08/16/2024 08/20/2024 08/31/2024 09/03/2024 09/06/2024 09/10/2024  Vitals with BMI        Systolic 97 !  101  87 (L)  90 !  85 (L)  88 (L)   Systolic 88 (L)  93 !    89 (L)  92 !   Systolic  88 (L)       Systolic  79 (L)       Diastolic 58 !  61  64 !  57 !  49 !  56 !   Diastolic 55 !  49 !    56 !  59 !   Diastolic  51  !       Diastolic  52 !         Legend: ! Abnormal (L) Low     Final diagnoses:  None    ED Discharge Orders     None          Arloa Chroman, PA-C 09/10/24 1527    Ruthe Cornet, DO 09/11/24 [phone]

## 2024-09-10 NOTE — Progress Notes (Signed)
 Pt Hgb 8.2 today and did not need blood transfusion. Pt came in c/o 6/10 pain in spleen and stating that at his last visit with Dr Onesimo he was given the option to be hospitalized to have fluid taken off. At the time patient declined this but today he and his wife stated that he would like to be admitted. Dr Onesimo agreed with this and facilitated direct admit to ED. Patient escorted to ED via wheelchair.

## 2024-09-10 NOTE — ED Notes (Addendum)
 Patient desatted to 89% on RA, placed on 2L Sims, MD notified.

## 2024-09-10 NOTE — ED Triage Notes (Addendum)
 Patient coming from cancer center, did not receive transfusion today. Patient has BLE swelling for 2 months, c/o SOB and left sided abdominal pain. 3+ pitting edema to BLE. 2+ dorsalis pedis pulses bilaterally. Currently on 40 mg lasix , takes medications regularly.

## 2024-09-11 ENCOUNTER — Inpatient Hospital Stay (HOSPITAL_COMMUNITY)

## 2024-09-11 DIAGNOSIS — L8990 Pressure ulcer of unspecified site, unspecified stage: Secondary | ICD-10-CM | POA: Insufficient documentation

## 2024-09-11 DIAGNOSIS — R601 Generalized edema: Secondary | ICD-10-CM | POA: Diagnosis not present

## 2024-09-11 DIAGNOSIS — D649 Anemia, unspecified: Secondary | ICD-10-CM

## 2024-09-11 DIAGNOSIS — Z7189 Other specified counseling: Secondary | ICD-10-CM | POA: Diagnosis not present

## 2024-09-11 DIAGNOSIS — C946 Myelodysplastic disease, not classified: Secondary | ICD-10-CM

## 2024-09-11 HISTORY — PX: IR PARACENTESIS: IMG2679

## 2024-09-11 LAB — CBC
HCT: 24.1 % — ABNORMAL LOW (ref 39.0–52.0)
Hemoglobin: 8 g/dL — ABNORMAL LOW (ref 13.0–17.0)
MCH: 33.6 pg (ref 26.0–34.0)
MCHC: 33.2 g/dL (ref 30.0–36.0)
MCV: 101.3 fL — ABNORMAL HIGH (ref 80.0–100.0)
Platelets: 32 K/uL — ABNORMAL LOW (ref 150–400)
RBC: 2.38 MIL/uL — ABNORMAL LOW (ref 4.22–5.81)
WBC: 40.5 K/uL — ABNORMAL HIGH (ref 4.0–10.5)
nRBC: 1.4 % — ABNORMAL HIGH (ref 0.0–0.2)

## 2024-09-11 LAB — COMPREHENSIVE METABOLIC PANEL WITH GFR
ALT: 158 U/L — ABNORMAL HIGH (ref 0–44)
AST: 56 U/L — ABNORMAL HIGH (ref 15–41)
Albumin: 2.9 g/dL — ABNORMAL LOW (ref 3.5–5.0)
Alkaline Phosphatase: 509 U/L — ABNORMAL HIGH (ref 38–126)
Anion gap: 7 (ref 5–15)
BUN: 52 mg/dL — ABNORMAL HIGH (ref 8–23)
CO2: 26 mmol/L (ref 22–32)
Calcium: 7.9 mg/dL — ABNORMAL LOW (ref 8.9–10.3)
Chloride: 98 mmol/L (ref 98–111)
Creatinine, Ser: 1.8 mg/dL — ABNORMAL HIGH (ref 0.61–1.24)
GFR, Estimated: 38 mL/min — ABNORMAL LOW (ref >60)
Glucose, Bld: 137 mg/dL — ABNORMAL HIGH (ref 70–99)
Potassium: 5 mmol/L (ref 3.5–5.1)
Sodium: 131 mmol/L — ABNORMAL LOW (ref 135–145)
Total Bilirubin: 4.5 mg/dL — ABNORMAL HIGH (ref 0.0–1.2)
Total Protein: 4.2 g/dL — ABNORMAL LOW (ref 6.5–8.1)

## 2024-09-11 MED ORDER — LIDOCAINE-EPINEPHRINE 1 %-1:100000 IJ SOLN
20.0000 mL | Freq: Once | INTRAMUSCULAR | Status: DC
Start: 2024-09-11 — End: 2024-09-13

## 2024-09-11 MED ORDER — LOPERAMIDE HCL 2 MG PO CAPS: 4.0000 mg | ORAL_CAPSULE | ORAL | Status: DC | PRN

## 2024-09-11 MED ORDER — LIDOCAINE-EPINEPHRINE 1 %-1:100000 IJ SOLN
20.0000 mL | Freq: Once | INTRAMUSCULAR | Status: AC
  Administered 2024-09-11: 15 mL via INTRADERMAL

## 2024-09-11 MED ORDER — MIDODRINE HCL 5 MG PO TABS
10.0000 mg | ORAL_TABLET | Freq: Three times a day (TID) | ORAL | Status: DC
  Administered 2024-09-11 – 2024-09-12 (×4): 10 mg via ORAL
  Filled 2024-09-11 (×4): qty 2

## 2024-09-11 MED ORDER — ALBUMIN HUMAN 25 % IV SOLN
50.0000 g | Freq: Four times a day (QID) | INTRAVENOUS | Status: AC
Start: 2024-09-11 — End: 2024-09-12
  Administered 2024-09-11 – 2024-09-12 (×3): 50 g via INTRAVENOUS
  Filled 2024-09-11 (×4): qty 200

## 2024-09-11 MED ORDER — TRAMADOL HCL 50 MG PO TABS
50.0000 mg | ORAL_TABLET | Freq: Four times a day (QID) | ORAL | Status: DC | PRN
  Administered 2024-09-11 – 2024-09-13 (×3): 50 mg via ORAL
  Filled 2024-09-11 (×3): qty 1

## 2024-09-11 MED ORDER — POLYETHYLENE GLYCOL 3350 17 G PO PACK
17.0000 g | PACK | Freq: Every day | ORAL | Status: DC
  Administered 2024-09-11 – 2024-09-13 (×3): 17 g via ORAL
  Filled 2024-09-11 (×3): qty 1

## 2024-09-11 MED ORDER — LIDOCAINE-EPINEPHRINE 1 %-1:100000 IJ SOLN
INTRAMUSCULAR | Status: AC
  Filled 2024-09-11: qty 1

## 2024-09-11 MED ORDER — ALUM & MAG HYDROXIDE-SIMETH 200-200-20 MG/5ML PO SUSP
30.0000 mL | ORAL | Status: DC | PRN
  Administered 2024-09-11 – 2024-09-13 (×8): 30 mL via ORAL
  Filled 2024-09-11 (×8): qty 30

## 2024-09-11 NOTE — Procedures (Signed)
 Ultrasound-guided diagnostic and therapeutic paracentesis performed yielding 2.1 liters of turbid, yellow  fluid. No immediate complications. A portion of the fluid was sent to the lab for preordered studies. EBL none.

## 2024-09-11 NOTE — Plan of Care (Signed)

## 2024-09-11 NOTE — Progress Notes (Cosign Needed Addendum)
 Fernando Lee   DOB:14-Feb-1946   FM#:[id number]      ASSESSMENT & PLAN:  Fernando Lee is an 78 year old male patient with oncologic history MDS, JAK2 myeloproliferative neoplasm.  He was admitted on 09/10/2024 with complaints of worsening LE edema, penile edema, and splenic pain. Follows with Dr. Claiborne oncology.   Splenomegaly/Abdominal pain Anasarca with worsening LE and penile edema - Patient admitted 11/24 with complaints of worsening edema of LE and penis.   -- Also complains of worsening abdominal/splenic pain.  -- On pain meds, patient requested Tramadol  as he states it makes him feel better than current pain meds therefore ordered it. 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  - Hemoglobin 8.0 today - Patient started on Ojjaara  on 08/09/2024, now on hold. Acyclovir  also on hold. Jadenu  has previously been on hold.   - Medical oncology/Dr. Onesimo following and will make further recommendations.   Thrombocytopenia -- Low platelets 32K -- No transfusion needed at this time. -- Continue to monitor CBC with differential closely  Leukocytosis -- WBC elevated 40.5 -- Afebrile, monitor fever curve -- Continue steroids and antibiotics/antiviral as ordered      Code Status DNR-Limited  Subjective:  Patient seen awake and alert laying in bed.  Abdominal distention is noted with lower extremity edema.  States that he is in a lot of pain in his stomach, sides, and back.  Appears slightly jaundiced.  Patient's wife is at bedside.  Objective:   Intake/Output Summary (Last 24 hours) at 09/11/2024 0944 Last data filed at 09/11/2024 9660 Gross per 24 hour  Intake 324 ml  Output --  Net 324 ml     PHYSICAL EXAMINATION: ECOG PERFORMANCE STATUS: 3 - Symptomatic, >50% confined to bed  Vitals:   09/11/24 0444 09/11/24 0836  BP: (!) 97/52  (!) 89/57  Pulse: 89 85  Resp: 17 20  Temp: 98.4 F (36.9 C) 97.7 F (36.5 C)  SpO2: 94% 99%   Filed Weights   09/10/24 1138  Weight: 172 lb (78 kg)    GENERAL: alert, + chronically ill-appearing + LE edema SKIN: + Slightly jaundiced skin color EYES: + Slight scleral icterus  OROPHARYNX: no exudate, no erythema and lips, buccal mucosa, and tongue normal  NECK: supple, thyroid  normal size, non-tender, without nodularity LYMPH: no palpable lymphadenopathy in the cervical, axillary or inguinal LUNGS: clear to auscultation and percussion with normal breathing effort HEART: + Bilateral lower extremity edema  ABDOMEN: + Abdominal distention MUSCULOSKELETAL: no cyanosis of digits and no clubbing  PSYCH: alert & oriented x 3 with fluent speech NEURO: no focal motor/sensory deficits   All questions were answered. The patient knows to call the clinic with any problems, questions or concerns.   The total time spent in the appointment was 40 minutes encounter with patient including review of chart and various tests results, discussions about plan of care and coordination of care plan  Olam JINNY Brunner, NP 09/11/2024 9:44 AM    Labs Reviewed:  Lab Results  Component Value Date   WBC 40.5 (H) 09/11/2024   HGB 8.0 (L) 09/11/2024   HCT 24.1 (L) 09/11/2024   MCV 101.3 (H) 09/11/2024   PLT 32 (L) 09/11/2024   Recent Labs    09/03/24 0939 09/10/24 1206 09/11/24 0453  NA 133* 129* 131*  K 4.6 5.0 5.0  CL 101 97* 98  CO2 27  23 26  GLUCOSE 149* 128* 137*  BUN 42* 50* 52*  CREATININE 1.58* 1.73* 1.80*  CALCIUM  8.1* 7.8* 7.9*  GFRNONAA 44* 39* 38*  PROT 4.6* 4.4* 4.2*  ALBUMIN  3.3* 2.7* 2.9*  AST 34 48* 56*  ALT 120* 145* 158*  ALKPHOS 365* 482* 509*  BILITOT 3.3* 3.2* 4.5*    Studies Reviewed:  CT ABDOMEN PELVIS WO CONTRAST Result Date: 09/10/2024 CLINICAL DATA:  Abdominal distension, pain, ascites. Recent splenic infarct. EXAM: CT ABDOMEN AND PELVIS WITHOUT CONTRAST  TECHNIQUE: Multidetector CT imaging of the abdomen and pelvis was performed following the standard protocol without IV contrast. RADIATION DOSE REDUCTION: This exam was performed according to the departmental dose-optimization program which includes automated exposure control, adjustment of the mA and/or kV according to patient size and/or use of iterative reconstruction technique. COMPARISON:  08/10/2024. FINDINGS: Lower chest: Moderate right and large left pleural effusions with compressive atelectasis in both lower lobes. New peribronchovascular ground-glass bilaterally. Central line is seen in the SVC, terminating in the high right atrium. Partially imaged low-attenuation lesion in the prevascular space measures 2.3 x 2.6 cm. Small pericardial effusion. Heart is enlarged. Atherosclerotic calcification of the aorta, aortic valve and coronary arteries. Distal esophagus is grossly unremarkable. Hepatobiliary: Liver is diffusely increased in density and is enlarged, measuring 26.6 cm. Margin is slightly irregular. Gallstones. No biliary ductal dilatation. Pancreas: Negative. Spleen: Enlarged, 20.8 cm. Adrenals/Urinary Tract: Adrenal glands are unremarkable. Small bilateral renal stones. Kidneys are otherwise grossly unremarkable. Ureters are decompressed. 8 mm stone at the left ureterovesical junction. 8 mm dependent stone in the bladder. Stomach/Bowel: Moderate hiatal hernia. Stomach, small bowel and colon are otherwise unremarkable. Appendix is not readily visualized. Vascular/Lymphatic: Atherosclerotic calcification of the aorta. Gastroesophageal varices. Retroperitoneal lymph nodes measure up to 9 mm in the aortocaval station (2/51). Reproductive: Prostate is enlarged. Other: Large ascites.  Diffuse body wall edema. Musculoskeletal: Degenerative changes in the spine. IMPRESSION: 1. 8 mm stone at the left ureterovesical junction without hydronephrosis. 2. Moderate right and large left pleural effusions with  compressive atelectasis in both lower lobes. New peribronchovascular ground-glass suggests pulmonary edema. 3. Small pericardial effusion. 4. Large ascites. 5. Cirrhosis with portal hypertension. Increased density of the liver can be seen with iron  deposition disease. 6. Cholelithiasis. 7. Small bilateral renal stones. 8. Moderate hiatal hernia. 9. 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 If further evaluation is desired, nonemergent CT chest without and with contrast should be considered. 10. Enlarged prostate. 11. Aortic atherosclerosis (ICD10-I70.0). Coronary artery calcification. Electronically Signed   By: Newell Eke M.D.   On: 09/10/2024 16:08   DG Chest Port 1 View Result Date: 09/10/2024 CLINICAL DATA:  Shortness of breath. EXAM: PORTABLE CHEST 1 VIEW COMPARISON:  Chest CT dated 08/03/2024 FINDINGS: Right-sided Port-A-Cath with tip over the right atrium. There is cardiomegaly with vascular congestion and edema. Small bilateral pleural effusions and bibasilar atelectasis. No pneumothorax. No acute osseous pathology. IMPRESSION: Cardiomegaly with findings of CHF. Electronically Signed   By: Vanetta Chou M.D.   On: 09/10/2024 13:00   ADDENDUM  .Patient was Personally and independently interviewed, examined and relevant elements of the history of present illness were reviewed in details and an assessment and plan was created. All elements of the patient's history of present illness , assessment and plan were discussed in details with Olam Brunner NP. The above documentation reflects our combined findings assessment and plan.   Patient admitted with anasarca, worsening symptoms of portal  hypertension and worsening liver and kidney function. 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  He is also having increasing WBC count increases with myeloid left shift  and increased basophils suggesting possible progression of his MPN/MDS syndrome.  Cannot rule out early transformation.  Patient is progressively losing weight muscle mass and functional capacity.  He feels too weak to continue any significant treatments which is very understandable. On in detail conversation with him with his wife at bedside. We discussed there is no clear treatment that we will significantly alter the course of his underlying disease and with worsening performance status and increasing symptom burden he would be very appropriate to consider best supportive cares through hospice. His wife [name] is also a patient of mine under treatment for multiple myeloma and both of them agree that it might be difficult for him to have adequate help with home hospice and are keen to consider inpatient hospice at [facility] pace. Patient notes he is very comfortable with this decision is looking forward to help with logistics to try to get him to inpatient hospice. He felt some comfort with his therapeutic paracentesis.  He is getting IV albumin  and diuresis to try to help with his fluid overload if responsive.  There is also consideration for possible therapeutic thoracentesis.  I discussed with the patient that at this time it would be completely his decision and is being done primarily for comfort. Appreciate excellent care by hospital medicine team.  Dr. Jeryl is already involved from a palliative care standpoint.  Emaline Saran MD MS Hematology/Oncology Physician [facility] Emaline Saran MD MS

## 2024-09-11 NOTE — Progress Notes (Signed)
 [facility] PROGRESS NOTE    Progress Note  Fernando Lee  FMW:[id number] DOB: 10/13/1946 DOA: 09/10/2024 PCP: Dayna Motto, DO     Brief Narrative:   Fernando Lee is an 78 y.o. male past medical history of JAK2/MDS, splenomegaly, HFpEF anemia and thrombocytopenia followed by hematology with multiple transfusions over the last several weeks, with chronic leukocytosis who has refused hospice care during his last admission admitted to the hospital for acute kidney injury and worsening anasarca.  Relates that for the last couple of weeks he is developing nonproductive gradual cough and abdominal discomfort with worsening lower extremity edema.  Assessment/Plan:   Anasarca with bilateral pleural effusions and large volume ascites: Multifactorial in the setting of hypoalbuminemia multiple transfusions CT abdomen and pelvis showed moderate right and large sided pleural effusion and pulmonary edema.  Small pericardial effusion large amount of ascites.  With cirrhosis and portal hypertension. IR was consulted for therapeutic thoracocentesis and paracentesis was obtained cultures. The patient denies any abdominal pain fever confusion. Had a long discussion with the patient he relates he is tired of this he would like to move towards hospice care, he relates his wife is not ready.   He would like to be tuned up as best as possible as per patient remove the fluid from the lungs and the abdomen and then get him home with his family. Will go ahead and consult palliative care to assist with care and talk to the wife to transition to hospice.  Chronic HFpEF grade 2 diastolic dysfunction: He was on Lasix  20 at home now with no acute kidney injury. Hold diuretic therapy in the setting of acute kidney injury.  Acute kidney injury: Question hepatorenal syndrome. Was started on IV albumin , will go ahead and started on midodrine .  MDS/JAK2 mutation: With anemia and thrombocytopenia. Dr. Onesimo has  been notified awaiting further recommendations.  Goals of care: Palliative care has been consulted once again further discussion about hospice due to his poor prognosis  Pressure injury of skin RN Pressure Injury Documentation: Wound 09/10/24 1830 Pressure Injury Medial Stage 1 -  Intact skin with non-blanchable redness of a localized area usually over a bony prominence. (Active)     DVT prophylaxis: scd Family Communication:wife Status is: Inpatient Remains inpatient appropriate because: Anasarca    Code Status:     Code Status Orders  (From admission, onward)           Start     Ordered   09/10/24 1528  Do not attempt resuscitation (DNR)- Limited -Do Not Intubate (DNI)  Continuous       Question Answer Comment  If pulseless and not breathing No CPR or chest compressions.   In Pre-Arrest Conditions (Patient Is Breathing and Has A Pulse) Do not intubate. Provide all appropriate non-invasive medical interventions. Avoid ICU transfer unless indicated or required.   Consent: Discussion documented in EHR or advanced directives reviewed      09/10/24 1527           Code Status History     Date Active Date Inactive Code Status Order ID Comments User Context   08/10/2024 1341 08/13/2024 1703 Limited: Do not attempt resuscitation (DNR) -DNR-LIMITED -Do Not Intubate/DNI  [id number]  Zella Katha HERO, MD ED   08/03/2024 1747 08/04/2024 1929 Full Code [id number]  Celinda Alm Lot, MD ED   09/16/2023 1352 09/21/2023 0040 Full Code [id number]  Celinda Alm Lot, MD ED   08/31/2022 2013 09/01/2022 2046 Full Code [id number]  Lonzell Emeline HERO, DO ED         IV Access:   Peripheral IV   Procedures and diagnostic studies:   CT ABDOMEN PELVIS WO CONTRAST Result Date: 09/10/2024 CLINICAL DATA:  Abdominal distension, pain, ascites. Recent splenic infarct. EXAM: CT ABDOMEN AND PELVIS WITHOUT CONTRAST TECHNIQUE: Multidetector CT imaging of the abdomen and pelvis was  performed following the standard protocol without IV contrast. RADIATION DOSE REDUCTION: This exam was performed according to the departmental dose-optimization program which includes automated exposure control, adjustment of the mA and/or kV according to patient size and/or use of iterative reconstruction technique. COMPARISON:  08/10/2024. FINDINGS: Lower chest: Moderate right and large left pleural effusions with compressive atelectasis in both lower lobes. New peribronchovascular ground-glass bilaterally. Central line is seen in the SVC, terminating in the high right atrium. Partially imaged low-attenuation lesion in the prevascular space measures 2.3 x 2.6 cm. Small pericardial effusion. Heart is enlarged. Atherosclerotic calcification of the aorta, aortic valve and coronary arteries. Distal esophagus is grossly unremarkable. Hepatobiliary: Liver is diffusely increased in density and is enlarged, measuring 26.6 cm. Margin is slightly irregular. Gallstones. No biliary ductal dilatation. Pancreas: Negative. Spleen: Enlarged, 20.8 cm. Adrenals/Urinary Tract: Adrenal glands are unremarkable. Small bilateral renal stones. Kidneys are otherwise grossly unremarkable. Ureters are decompressed. 8 mm stone at the left ureterovesical junction. 8 mm dependent stone in the bladder. Stomach/Bowel: Moderate hiatal hernia. Stomach, small bowel and colon are otherwise unremarkable. Appendix is not readily visualized. Vascular/Lymphatic: Atherosclerotic calcification of the aorta. Gastroesophageal varices. Retroperitoneal lymph nodes measure up to 9 mm in the aortocaval station (2/51). Reproductive: Prostate is enlarged. Other: Large ascites.  Diffuse body wall edema. Musculoskeletal: Degenerative changes in the spine. IMPRESSION: 1. 8 mm stone at the left ureterovesical junction without hydronephrosis. 2. Moderate right and large left pleural effusions with compressive atelectasis in both lower lobes. New peribronchovascular  ground-glass suggests pulmonary edema. 3. Small pericardial effusion. 4. Large ascites. 5. Cirrhosis with portal hypertension. Increased density of the liver can be seen with iron  deposition disease. 6. Cholelithiasis. 7. Small bilateral renal stones. 8. Moderate hiatal hernia. 9. 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 If further evaluation is desired, nonemergent CT chest without and with contrast should be considered. 10. Enlarged prostate. 11. Aortic atherosclerosis (ICD10-I70.0). Coronary artery calcification. Electronically Signed   By: Newell Eke M.D.   On: 09/10/2024 16:08   DG Chest Port 1 View Result Date: 09/10/2024 CLINICAL DATA:  Shortness of breath. EXAM: PORTABLE CHEST 1 VIEW COMPARISON:  Chest CT dated 08/03/2024 FINDINGS: Right-sided Port-A-Cath with tip over the right atrium. There is cardiomegaly with vascular congestion and edema. Small bilateral pleural effusions and bibasilar atelectasis. No pneumothorax. No acute osseous pathology. IMPRESSION: Cardiomegaly with findings of CHF. Electronically Signed   By: Vanetta Chou M.D.   On: 09/10/2024 13:00     Medical Consultants:   None.   Subjective:    KAVEON BLATZ relates he is tired of doing this close wants us  to make him feel better and try to get him home  Objective:    Vitals:   09/11/24 0030 09/11/24 0112 09/11/24 0339 09/11/24 0444  BP: (!) 90/59 (!) 92/49 95/61 (!) 97/52  Pulse: 90 87 88 89  Resp: 16   17  Temp: 97.7 F (36.5 C) (!) 97.5 F (36.4 C) 97.9 F (36.6 C) 98.4 F (36.9 C)  TempSrc: Oral Oral Oral   SpO2: 96% 96% 92% 94%  Weight:      Height:       SpO2: 94 % O2 Flow Rate (L/min): 2 L/min   Intake/Output Summary (Last 24 hours) at 09/11/2024 0829 Last data filed at 09/11/2024 9660 Gross per 24 hour  Intake 324 ml  Output --  Net 324 ml   Filed Weights   09/10/24 1138  Weight: 78 kg    Exam: General exam: In no  acute distress. Respiratory system: Good air movement and clear to auscultation. Cardiovascular system: S1 & S2 heard, RRR. No JVD. Gastrointestinal system: Abdomen is nondistended, soft and nontender.  Extremities: No pedal edema. Skin: No rashes, lesions or ulcers Psychiatry: Judgement and insight appear normal. Mood & affect appropriate.    Data Reviewed:    Labs: Basic Metabolic Panel: Recent Labs  Lab 09/10/24 1206 09/11/24 0453  NA 129* 131*  K 5.0 5.0  CL 97* 98  CO2 23 26  GLUCOSE 128* 137*  BUN 50* 52*  CREATININE 1.73* 1.80*  CALCIUM  7.8* 7.9*   GFR Estimated Creatinine Clearance: 30.6 mL/min (A) (by C-G formula based on SCr of 1.8 mg/dL (H)). Liver Function Tests: Recent Labs  Lab 09/10/24 1206 09/11/24 0453  AST 48* 56*  ALT 145* 158*  ALKPHOS 482* 509*  BILITOT 3.2* 4.5*  PROT 4.4* 4.2*  ALBUMIN  2.7* 2.9*   No results for input(s): LIPASE, AMYLASE in the last 168 hours. No results for input(s): AMMONIA in the last 168 hours. Coagulation profile No results for input(s): INR, PROTIME in the last 168 hours. COVID-19 Labs  No results for input(s): DDIMER, FERRITIN, LDH, CRP in the last 72 hours.  Lab Results  Component Value Date   SARSCOV2NAA NEGATIVE 09/10/2024    CBC: Recent Labs  Lab 09/06/24 0801 09/10/24 0953 09/10/24 1206 09/10/24 2012 09/11/24 0453  WBC 32.6* 46.4* 41.9*  --  40.5*  NEUTROABS 15.6* 19.5* 15.1*  --   --   HGB 8.6* 8.2* 7.9* 7.9* 8.0*  HCT 25.9* 24.4* 24.1* 24.1* 24.1*  MCV 100.8* 104.7* 107.6*  --  101.3*  PLT 46* 38* 36*  --  32*   Cardiac Enzymes: No results for input(s): CKTOTAL, CKMB, CKMBINDEX, TROPONINI in the last 168 hours. BNP (last 3 results) Recent Labs    08/03/24 1444 08/10/24 1019 09/10/24 1206  PROBNP 481.0* 613.0* 668.0*   CBG: No results for input(s): GLUCAP in the last 168 hours. D-Dimer: No results for input(s): DDIMER in the last 72 hours. Hgb A1c: No  results for input(s): HGBA1C in the last 72 hours. Lipid Profile: No results for input(s): CHOL, HDL, LDLCALC, TRIG, CHOLHDL, LDLDIRECT in the last 72 hours. Thyroid  function studies: No results for input(s): TSH, T4TOTAL, T3FREE, THYROIDAB in the last 72 hours.  Invalid input(s): FREET3 Anemia work up: No results for input(s): VITAMINB12, FOLATE, FERRITIN, TIBC, IRON , RETICCTPCT in the last 72 hours. Sepsis Labs: Recent Labs  Lab 09/06/24 0801 09/10/24 0953 09/10/24 1206 09/11/24 0453  WBC 32.6* 46.4* 41.9* 40.5*   Microbiology Recent Results (from the past 240 hours)  Resp panel by RT-PCR (RSV, Flu A&B, Covid) Anterior Nasal Swab     Status: None   Collection Time: 09/10/24 12:06 PM   Specimen: Anterior Nasal Swab  Result Value Ref Range Status   SARS Coronavirus 2 by RT PCR NEGATIVE NEGATIVE Final    Comment: (NOTE) SARS-CoV-2 target nucleic acids are NOT DETECTED.  The SARS-CoV-2 RNA is generally detectable in upper respiratory specimens during the acute phase of infection. The  lowest concentration of SARS-CoV-2 viral copies this assay can detect is 138 copies/mL. A negative result does not preclude SARS-Cov-2 infection and should not be used as the sole basis for treatment or other patient management decisions. A negative result may occur with  improper specimen collection/handling, submission of specimen other than nasopharyngeal swab, presence of viral mutation(s) within the areas targeted by this assay, and inadequate number of viral copies(<138 copies/mL). A negative result must be combined with clinical observations, patient history, and epidemiological information. The expected result is Negative.  Fact Sheet for Patients:  bloggercourse.com  Fact Sheet for Healthcare Providers:  seriousbroker.it  This test is no t yet approved or cleared by the [place]  FDA and  has  been authorized for detection and/or diagnosis of SARS-CoV-2 by FDA under an Emergency Use Authorization (EUA). This EUA will remain  in effect (meaning this test can be used) for the duration of the COVID-19 declaration under Section 564(b)(1) of the Act, 21 U.S.C.section 360bbb-3(b)(1), unless the authorization is terminated  or revoked sooner.       Influenza A by PCR NEGATIVE NEGATIVE Final   Influenza B by PCR NEGATIVE NEGATIVE Final    Comment: (NOTE) The Xpert Xpress SARS-CoV-2/FLU/RSV plus assay is intended as an aid in the diagnosis of influenza from Nasopharyngeal swab specimens and should not be used as a sole basis for treatment. Nasal washings and aspirates are unacceptable for Xpert Xpress SARS-CoV-2/FLU/RSV testing.  Fact Sheet for Patients: bloggercourse.com  Fact Sheet for Healthcare Providers: seriousbroker.it  This test is not yet approved or cleared by the [place]  FDA and has been authorized for detection and/or diagnosis of SARS-CoV-2 by FDA under an Emergency Use Authorization (EUA). This EUA will remain in effect (meaning this test can be used) for the duration of the COVID-19 declaration under Section 564(b)(1) of the Act, 21 U.S.C. section 360bbb-3(b)(1), unless the authorization is terminated or revoked.     Resp Syncytial Virus by PCR NEGATIVE NEGATIVE Final    Comment: (NOTE) Fact Sheet for Patients: bloggercourse.com  Fact Sheet for Healthcare Providers: seriousbroker.it  This test is not yet approved or cleared by the [place]  FDA and has been authorized for detection and/or diagnosis of SARS-CoV-2 by FDA under an Emergency Use Authorization (EUA). This EUA will remain in effect (meaning this test can be used) for the duration of the COVID-19 declaration under Section 564(b)(1) of the Act, 21 U.S.C. section 360bbb-3(b)(1), unless the  authorization is terminated or revoked.  Performed at [facility], [address] [place], [place] [address]      Medications:    alfuzosin   10 mg Oral QHS   Chlorhexidine  Gluconate Cloth  6 each Topical Daily   docusate sodium   100 mg Oral Daily   valACYclovir   500 mg Oral q AM   Continuous Infusions:  sodium chloride         LOS: 1 day   Erle Odell Castor  [facility]  09/11/2024, 8:29 AM

## 2024-09-11 NOTE — Consult Note (Signed)
 Consultation Note Date: 09/11/2024   Patient Name: Fernando Lee  DOB: [birth year]  MRN: [id number]  Age / Sex: 78 y.o., male  PCP: Dayna Motto, DO Referring Physician: Odell Castor, Erle, MD  Reason for Consultation: Establishing goals of care  HPI/Patient Profile: 78 y.o. male admitted on 09/10/2024    Clinical Assessment and Goals of Care: 78 year old gentleman, known to palliative medicine service, seen for goals of care discussions and a previous hospitalization towards the end of October 2025, follows with Dr. Onesimo, life-limiting illness of JAK2/MDS, splenomegaly, heart failure with preserved ejection fraction anemia thrombocytopenia Has required multiple transfusions over the past several weeks Chronic leukocytosis Refused hospice care towards the last admission and was to be set up with outpatient community-based palliative support Admitted currently to hospital medicine service for worsening anasarca and acute kidney injury Patient admitted with worsening abdominal discomfort and worsening lower extremity edema, anasarca with bilateral pleural effusions and large volume ascites which is deemed to be multifactorial in etiology in the setting of hypoalbuminemia, multiple recent transfusions CT abdomen and pelvis showing moderate right and large left-sided pleural effusion pulmonary edema cirrhosis portal hypertension. At this time interventional radiology has been consulted for therapeutic thoracentesis as well as paracentesis Palliative care consulted for ongoing goals of care discussions. Palliative medicine is specialized medical care for people living with serious illness. It focuses on providing relief from the symptoms and stress of a serious illness. The goal is to improve quality of life for both the patient and the family. Goals of care: Broad aims of medical therapy in relation to the  patient's values and preferences. Our aim is to provide medical care aimed at enabling patients to achieve the goals that matter most to them, given the circumstances of their particular medical situation and their constraints.   NEXT OF KIN  Wife   SUMMARY OF RECOMMENDATIONS   Goals of care discussions under taken with the patient as well as call placed and discussed with wife as well.  Currently, patient awaiting evaluation by interventional radiology.  Discussed with patient, subsequently discussed with wife with regards to patient's current functional status and current medical condition.  Patient to be evaluated for removal of fluid from lungs and abdomen overall, his goal is to be home with his family.  Patient and wife have had 1 conversation with [facility] palliative services in the past few weeks.  Today, at the time of this initial consultation, we reviewed the differences between home-based palliative versus home-based hospice services. We discussed that at present, recommendation is likely for home hospice services instead of home-based palliative support.  Discussed frankly but compassionately that home-based hospice services means prognosis of 6 months or less for patients and families who have chosen to forego disease directed or curative therapies or when none are available, hospice emphasizes comfort focused care 24/7 support in preparation for end-of-life the goal of care shifts from treatment of the underlying disease to maximizing comfort dignity and support for both the patient and family.  Patient's wife  asks for some time and space for reflection so that she can continue these discussions with her husband. Palliative care will continue to follow Thank you for the consult.  Code Status/Advance Care Planning: DNR   Symptom Management:     Palliative Prophylaxis:  Frequent Pain Assessment    Psycho-social/Spiritual:  Desire for further Chaplaincy support:yes Additional  Recommendations: Caregiving  Support/Resources  Prognosis:  Unable to determine  Discharge Planning: To Be Determined      Primary Diagnoses: Present on Admission:  Anasarca  Symptomatic anemia  MDS (myelodysplastic syndrome) (HCC)   I have reviewed the medical record, interviewed the patient and family, and examined the patient. The following aspects are pertinent.  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   Kidney stones    Male erectile dysfunction, unspecified    Mixed dyslipidemia    Mixed hyperlipidemia    Ocular migraine    PAC (premature atrial contraction)    Personal history of colonic polyps    Prediabetes    Residual hemorrhoidal skin tags    Spleen enlarged    nov 2023 hospitalized   Unspecified hemorrhoids    Social History   Socioeconomic History   Marital status: Married    Spouse name: Not on file   Number of children: Not on file   Years of education: Not on file   Highest education level: Not on file  Occupational History   Not on file  Tobacco Use   Smoking status: Former    Types: Cigarettes   Smokeless tobacco: Never  Substance and Sexual Activity   Alcohol use: Yes    Alcohol/week: 1.0 standard drink of alcohol    Types: 1 Cans of beer per week   Drug use: Not on file   Sexual activity: Not on file  Other Topics Concern   Not on file  Social History Narrative   Not on file   Social Drivers of Health   Financial Resource Strain: Not on file  Food Insecurity: No Food Insecurity (09/10/2024)   Hunger Vital Sign    Worried About Running Out of Food in the Last Year: Never true     Ran Out of Food in the Last Year: Never true  Transportation Needs: No Transportation Needs (09/10/2024)   PRAPARE - Administrator, Civil Service (Medical): No    Lack of Transportation (Non-Medical): No  Physical Activity: Not on file  Stress: Not on file  Social Connections: Moderately Integrated (09/10/2024)   Social Connection and Isolation Panel    Frequency of Communication with Friends and Family: More than three times a week    Frequency of Social Gatherings with Friends and Family: Twice a week    Attends Religious Services: 1 to 4 times per year    Active Member of [employer] or Organizations: No    Attends Banker Meetings: Never    Marital Status: Married   Family History  Problem Relation Age of Onset   Stroke Brother    Congestive Heart Failure Brother  Scheduled Meds:  alfuzosin   10 mg Oral QHS   Chlorhexidine  Gluconate Cloth  6 each Topical Daily   docusate sodium   100 mg Oral Daily   midodrine   10 mg Oral TID WC   valACYclovir   500 mg Oral q AM   Continuous Infusions:  sodium chloride      albumin  human     PRN Meds:.acetaminophen  **OR** acetaminophen , albuterol , alum & mag hydroxide-simeth, loperamide , ondansetron  **OR** ondansetron  (ZOFRAN ) IV, sodium chloride  flush, traZODone  Medications Prior to Admission:  Prior to Admission medications   Medication Sig Start Date End Date Taking? Authorizing Provider  alfuzosin  (UROXATRAL ) 10 MG 24 hr tablet Take 10 mg by mouth at bedtime.   Yes [provider]  Ascorbic Acid  (VITAMIN C  PO) Take 1 tablet by mouth daily.   Yes [provider]  B Complex Vitamins (B COMPLEX PO) Take 1 tablet by mouth daily.   Yes [provider]  Cholecalciferol  (VITAMIN D -3 PO) Take 1 capsule by mouth daily.   Yes [provider]  docusate sodium  (COLACE) 100 MG capsule Take 100 mg by mouth daily.   Yes [provider]  Flaxseed, Linseed, (FLAXSEED OIL PO) Take 1 capsule  by mouth daily.   Yes [provider]  furosemide  (LASIX ) 20 MG tablet Take 1 tablet by mouth once daily Patient taking differently: Take 20 mg by mouth See admin instructions. Take 20 mg by mouth in the morning and at lunchtime 08/27/24  Yes Kale, Emaline Brink, MD  Misc Natural Products (TURMERIC, CURCUMIN, PO) Take 1 capsule by mouth daily.   Yes [provider]  momelotinib dihydrochloride  (OJJAARA ) 100 MG tablet Take 1 tablet (100 mg total) by mouth daily. 08/04/24  Yes Onesimo Emaline Brink, MD  ondansetron  (ZOFRAN ) 8 MG tablet Take 1 tablet (8 mg total) by mouth every 8 (eight) hours as needed for nausea or vomiting. 09/03/24  Yes Onesimo Emaline Brink, MD  potassium chloride  SA (KLOR-CON  M) 20 MEQ tablet Take 1 tablet by mouth twice daily Patient taking differently: Take 10 mEq by mouth See admin instructions. Take 10 mEq by mouth with only the morning dose of Furosemide  07/24/24  Yes Onesimo Emaline Brink, MD  prochlorperazine  (COMPAZINE ) 10 MG tablet Take 1 tablet (10 mg total) by mouth every 6 (six) hours as needed for nausea or vomiting. 09/03/24  Yes Onesimo Emaline Brink, MD  TYLENOL  500 MG tablet Take 250-500 mg by mouth See admin instructions. Take 500 mg by mouth in the morning & at bedtime and an additional 250 mg twice a day   Yes [provider]  valACYclovir  (VALTREX ) 500 MG tablet Take 1 tablet (500 mg total) by mouth 2 (two) times daily. Patient taking differently: Take 500 mg by mouth in the morning. 04/18/24  Yes Onesimo Emaline Brink, MD  acetaminophen  (TYLENOL ) 325 MG tablet Take 2 tablets (650 mg total) by mouth every 6 (six) hours as needed for mild pain (pain score 1-3) (or Fever >/= 101). Patient not taking: Reported on 09/10/2024 09/20/23   Regalado, Owen A, MD  Deferasirox  (JADENU ) 180 MG TABS Take 2 tablets (360 mg total) by mouth daily. Take on an empty stomach. Patient not taking: Reported on 08/11/2024 05/18/24   Onesimo Emaline Brink, MD    Allergies  Allergen Reactions   Lactose Intolerance (Gi) Diarrhea   Review of Systems Admits to generalized pain Physical Exam Awake appearing gentleman resting in chair Moderate to severe abdominal distention from ascites Awake alert oriented Has lower extremity  edema Has regular work of breathing  Vital Signs: BP (!) 89/57 (BP Location: Right Arm)   Pulse 85   Temp 97.7 F (36.5 C) (Oral)   Resp 20   Ht 5' 7 (1.702 m)   Wt 78 kg   SpO2 99%   BMI 26.94 kg/m  Pain Scale: 0-10   Pain Score: 7    SpO2: SpO2: 99 % O2 Device:SpO2: 99 % O2 Flow Rate: .O2 Flow Rate (L/min): 2 L/min  IO: Intake/output summary:  Intake/Output Summary (Last 24 hours) at 09/11/2024 1037 Last data filed at 09/11/2024 1001 Gross per 24 hour  Intake 564 ml  Output --  Net 564 ml    LBM: Last BM Date : 09/10/24 Baseline Weight: Weight: 78 kg Most recent weight: Weight: 78 kg     Palliative Assessment/Data:   PPS 50%  Time In:  9 Time Out: 10.15  Time Total:  75 Greater than 50%  of this time was spent counseling and coordinating care related to the above assessment and plan.  Signed by: Lonia Serve, MD   Please contact Palliative Medicine Team phone at [phone] for questions and concerns.  For individual provider: See Tracey

## 2024-09-12 ENCOUNTER — Other Ambulatory Visit: Payer: Self-pay

## 2024-09-12 ENCOUNTER — Inpatient Hospital Stay: Admitting: Hematology

## 2024-09-12 DIAGNOSIS — K7460 Unspecified cirrhosis of liver: Secondary | ICD-10-CM

## 2024-09-12 DIAGNOSIS — R601 Generalized edema: Secondary | ICD-10-CM | POA: Diagnosis not present

## 2024-09-12 DIAGNOSIS — K7290 Hepatic failure, unspecified without coma: Secondary | ICD-10-CM

## 2024-09-12 DIAGNOSIS — I959 Hypotension, unspecified: Secondary | ICD-10-CM

## 2024-09-12 DIAGNOSIS — D469 Myelodysplastic syndrome, unspecified: Secondary | ICD-10-CM | POA: Diagnosis not present

## 2024-09-12 DIAGNOSIS — K766 Portal hypertension: Secondary | ICD-10-CM | POA: Diagnosis not present

## 2024-09-12 DIAGNOSIS — C946 Myelodysplastic disease, not classified: Secondary | ICD-10-CM

## 2024-09-12 LAB — TYPE AND SCREEN
ABO/RH(D): A POS
Antibody Screen: NEGATIVE
Unit division: 0

## 2024-09-12 LAB — BODY FLUID CELL COUNT WITH DIFFERENTIAL
Eos, Fluid: 3 %
Lymphs, Fluid: 8 %
Monocyte-Macrophage-Serous Fluid: 38 % — ABNORMAL LOW (ref 50–90)
Neutrophil Count, Fluid: 51 % — ABNORMAL HIGH (ref 0–25)
Total Nucleated Cell Count, Fluid: 1013 uL — ABNORMAL HIGH (ref 0–1000)

## 2024-09-12 LAB — PATHOLOGIST SMEAR REVIEW

## 2024-09-12 LAB — BPAM RBC
Blood Product Expiration Date: 202512092359
ISSUE DATE / TIME: 202511250051
Unit Type and Rh: 202512092359
Unit Type and Rh: 6200

## 2024-09-12 MED ORDER — MIDODRINE HCL 5 MG PO TABS
15.0000 mg | ORAL_TABLET | Freq: Three times a day (TID) | ORAL | Status: DC
  Administered 2024-09-12 – 2024-09-13 (×4): 15 mg via ORAL
  Filled 2024-09-12 (×4): qty 3

## 2024-09-12 MED ORDER — SODIUM CHLORIDE 0.9 % IV BOLUS
250.0000 mL | Freq: Once | INTRAVENOUS | Status: AC
  Administered 2024-09-12: 250 mL via INTRAVENOUS

## 2024-09-12 MED ORDER — HYDROMORPHONE HCL 1 MG/ML IJ SOLN
0.5000 mg | INTRAMUSCULAR | Status: DC | PRN
  Administered 2024-09-13: 0.5 mg via INTRAVENOUS
  Filled 2024-09-12: qty 0.5

## 2024-09-12 NOTE — TOC Progression Note (Signed)
 Transition of Care [facility]) - Progression Note    Patient Details  Name: Fernando Lee MRN: [id number] Date of Birth: May 30, 1946  Transition of Care [facility]) CM/SW Contact  Heather DELENA Saltness, LCSW Phone Number: 09/12/2024, 2:36 PM  Clinical Narrative:    Per Amy, [facility] hospice liaison, pt has been accepted to [facility] for residential hospice care. Pt will admit to facility tomorrow 11/27 by [facility]AR. TOC will continue to follow.   Expected Discharge Plan:  (TBD) Barriers to Discharge: Barriers Resolved   Expected Discharge Plan and Services In-house Referral: Clinical Social Work, Hospice / Palliative Care     Living arrangements for the past 2 months: Single Family Home                 DME Arranged: N/A DME Agency: NA       HH Arranged: NA HH Agency: NA         Social Drivers of Health (SDOH) Interventions SDOH Screenings   Food Insecurity: No Food Insecurity (09/10/2024)  Housing: Low Risk  (09/10/2024)  Transportation Needs: No Transportation Needs (09/10/2024)  Utilities: Not At Risk (09/10/2024)  Depression (PHQ2-9): Low Risk  (08/20/2024)  Recent Concern: Depression (PHQ2-9) - Medium Risk (08/10/2024)  Social Connections: Moderately Integrated (09/10/2024)  Tobacco Use: Medium Risk (09/10/2024)    Readmission Risk Interventions    09/12/2024    9:05 AM 08/13/2024   11:16 AM  Readmission Risk Prevention Plan  Transportation Screening Complete Complete  PCP or Specialist Appt within 5-7 Days  Complete  Home Care Screening  Complete  Medication Review (RN CM)  Complete  HRI or Home Care Consult Complete   Social Work Consult for Recovery Care Planning/Counseling Complete   Palliative Care Screening Complete   Medication Review Oceanographer) Complete     Signed: Heather Saltness, MSW, LCSW Clinical Social Worker Inpatient Care Management 09/12/2024 2:37 PM

## 2024-09-12 NOTE — Progress Notes (Signed)
 [facility]                                                                               Fernando Lee, is a 78 y.o. male, DOB - 10-03-1946, FMW:[id number] Admit date - 09/10/2024    Outpatient Primary MD for the patient is Fernando Motto, DO  LOS - 2  days    Brief summary   Fernando Lee is an 78 y.o. male past medical history of JAK2/MDS, splenomegaly, HFpEF anemia and thrombocytopenia followed by hematology with multiple transfusions over the last several weeks, with chronic leukocytosis who has refused hospice care during his last admission admitted to the hospital for acute kidney injury and worsening anasarca.  Relates that for the last couple of weeks he is developing nonproductive gradual cough and abdominal discomfort with worsening lower extremity edema.    Assessment & Plan    Assessment and Plan:  Anasarca with bilateral pleural effusions and large volume ascitics  Multifactorial in the setting of decompensated cirrhosis and portal hypertension IR consulted for therapeutic thoracentesis and paracentesis but unfortunately thoracentesis could not be done due to hypotension. Palliative care consulted in view of his declining health, deconditioning and worsening of cirrhosis Patient and his wife have opted for comfort measures and beacon placement.    History of chronic heart failure with preserved left ventricular ejection fraction with grade 2 diastolic dysfunction Unfortunately unable to use any diuretics due to profound hypotension and AKI    Acute kidney injury in the setting of hypotension probably heading towards hepatorenal syndrome. Patient received IV albumin  without any improvement in renal parameters.  He was started on midodrine  15 mg 3 times daily.   History of MDS with Jak 2 mutation Dr. Onesimo on board   Wound 09/10/24 1830 Pressure Injury Buttocks Medial Stage 1 -  Intact skin with non-blanchable redness of a localized area usually over  a bony prominence. (Active)  Local wound care    Estimated body mass index is 26.94 kg/m as calculated from the following:   Height as of this encounter: 5' 7 (1.702 m).   Weight as of this encounter: 78 kg.  Code Status: DNR DVT Prophylaxis:  SCDs Start: 09/10/24 1526   Level of Care: Level of care: Progressive Family Communication: None at bedside  Disposition Plan:     Remains inpatient appropriate: [facility] tomorrow  Procedures:  Paracentesis  Consultants:   Oncology Palliative care  Antimicrobials:   Anti-infectives (From admission, onward)    Start     Dose/Rate Route Frequency Ordered Stop   09/11/24 0700  valACYclovir  (VALTREX ) tablet 500 mg  Status:  Discontinued        500 mg Oral Every morning 09/10/24 1555 09/11/24 1121        Medications  Scheduled Meds:  alfuzosin   10 mg Oral QHS   Chlorhexidine  Gluconate Cloth  6 each Topical Daily   docusate sodium   100 mg Oral Daily   lidocaine -EPINEPHrine   20 mL Intradermal Once   midodrine   15 mg Oral TID WC   polyethylene glycol  17 g Oral Daily   Continuous Infusions:  sodium chloride      PRN Meds:.acetaminophen  **  OR** acetaminophen , albuterol , alum & mag hydroxide-simeth, HYDROmorphone  (DILAUDID ) injection, loperamide , ondansetron  **OR** ondansetron  (ZOFRAN ) IV, sodium chloride  flush, traMADol , traZODone     Subjective:   Grady Mohabir was seen and examined today.  Reports not feeling great at this point  Objective:   Vitals:   09/12/24 0953 09/12/24 0954 09/12/24 1041 09/12/24 1348  BP: (!) 74/47 (!) 83/49 (!) 96/48 (!) 94/47  Pulse: 86 85 83 82  Resp:    19  Temp:    97.7 F (36.5 C)  TempSrc:    Axillary  SpO2: 94% 94%  93%  Weight:      Height:        Intake/Output Summary (Last 24 hours) at 09/12/2024 1608 Last data filed at 09/12/2024 1045 Gross per 24 hour  Intake 1032.8 ml  Output 100 ml  Net 932.8 ml   Filed Weights   09/10/24 1138  Weight: 78 kg     Exam General  exam: Well-appearing elderly gentleman Respiratory system: diminished air entry.  Cardiovascular system: S1 & S2 heard, RRR. Gastrointestinal system: Abdomen is soft, distended bs+ Central nervous system: Alert , oriented.  Extremities: 3+ leg edema.  Skin: No rashes,    Data Reviewed:  I have personally reviewed following labs and imaging studies   CBC Lab Results  Component Value Date   WBC 40.5 (H) 09/11/2024   RBC 2.38 (L) 09/11/2024   HGB 8.0 (L) 09/11/2024   HCT 24.1 (L) 09/11/2024   MCV 101.3 (H) 09/11/2024   MCH 33.6 09/11/2024   PLT 32 (L) 09/11/2024   MCHC 33.2 09/11/2024   RDW Not Measured 09/11/2024   LYMPHSABS 3.4 09/10/2024   MONOABS 10.5 (H) 09/10/2024   EOSABS 3.4 (H) 09/10/2024   BASOSABS 2.1 (H) 09/10/2024     Last metabolic panel Lab Results  Component Value Date   NA 131 (L) 09/11/2024   K 5.0 09/11/2024   CL 98 09/11/2024   CO2 26 09/11/2024   BUN 52 (H) 09/11/2024   CREATININE 1.80 (H) 09/11/2024   GLUCOSE 137 (H) 09/11/2024   GFRNONAA 38 (L) 09/11/2024   CALCIUM  7.9 (L) 09/11/2024   PROT 4.2 (L) 09/11/2024   ALBUMIN  2.9 (L) 09/11/2024   LABGLOB 2.6 02/23/2021   BILITOT 4.5 (H) 09/11/2024   ALKPHOS 509 (H) 09/11/2024   AST 56 (H) 09/11/2024   ALT 158 (H) 09/11/2024   ANIONGAP 7 09/11/2024    CBG (last 3)  No results for input(s): GLUCAP in the last 72 hours.    Coagulation Profile: No results for input(s): INR, PROTIME in the last 168 hours.   Radiology Studies: IR Paracentesis Result Date: 09/11/2024 INDICATION: Patient with history of myelodysplasia/JAK2 positive myeloproliferative neoplasm, splenomegaly, cirrhosis with portal hypertension, chronic diastolic dysfunction, acute kidney injury, ascites, pleural effusions, undergoing evaluation for home hospice care; request received for diagnostic and therapeutic paracentesis. EXAM: ULTRASOUND GUIDED DIAGNOSTIC AND THERAPEUTIC PARACENTESIS MEDICATIONS: 6 mL 1% lidocaine  with  epinephrine  to skin and subcutaneous tissue COMPLICATIONS: None immediate. PROCEDURE: Informed written consent was obtained from the patient after a discussion of the risks, benefits and alternatives to treatment. A timeout was performed prior to the initiation of the procedure. Initial ultrasound scanning demonstrates a small to moderate amount of ascites within the right lower abdominal quadrant. The right lower abdomen was prepped and draped in the usual sterile fashion. 1% lidocaine  with epinephrine  was used for local anesthesia. Following this, a 19 gauge, 10-cm, Yueh catheter was introduced. An ultrasound image was saved for documentation  purposes. The paracentesis was performed. The catheter was removed and a dressing was applied. The patient tolerated the procedure well without immediate post procedural complication. Patient received post-procedure intravenous albumin ; see nursing notes for details. FINDINGS: A total of approximately 2.1 liters of turbid, yellow fluid was removed. Samples were sent to the laboratory as requested by the clinical team. IMPRESSION: Successful ultrasound-guided diagnostic and therapeutic paracentesis yielding 2.1 L of peritoneal fluid. Performed by Franky Rakers, PA-C PLAN: If the patient eventually requires >/=2 paracenteses in a 30 day period, candidacy for formal evaluation by the [facility] Interventional Radiology Portal Hypertension [facility] will be assessed. Thom Hall, MD [facility] Radiology Electronically Signed   By: Thom Hall M.D.   On: 09/11/2024 17:51       Elgie Butter M.D. [facility] 09/12/2024, 4:08 PM  Available via Epic secure chat 7am-7pm After 7 pm, please refer to night coverage provider listed on amion.

## 2024-09-12 NOTE — Progress Notes (Signed)
 Fernando Lee [address]  Hospice hospital liaison note   Referral received from [facility] for family interest in [facility].   Met with patient and wife in room to explain services and hospice philosophy and all questions answered.  [facility] is able to accept patient this afternoon once consents are complete. Patient and spouse have asked for transfer to take place tomorrow 11/27. [facility] Liaison will follow up to coordinate.   RN staff, you may call report at any time to  [phone], room is assigned when report is called.  Please leave IV intact and send completed DNR with patient.   Updated attending and [facility] manager via [employer].  Thank you for the opportunity to participate in this patient's care  Amy Darien BSN, RN [facility] Liaison [phone]

## 2024-09-12 NOTE — Progress Notes (Signed)
 Daily Progress Note   Patient Name: Fernando Lee       Date: 09/12/2024 DOB: June 11, 1946  Age: 78 y.o. MRN#: [id number] Attending Physician: Cherlyn Labella, MD Primary Care Physician: Dayna Motto, DO Admit Date: 09/10/2024  Reason for Consultation/Follow-up: Establishing goals of care  Subjective:  Awake, appears in mild to moderate distress due to ongoing abdominal pain Sitting in chair  Length of Stay: 2  Current Medications: Scheduled Meds:   alfuzosin   10 mg Oral QHS   Chlorhexidine  Gluconate Cloth  6 each Topical Daily   docusate sodium   100 mg Oral Daily   lidocaine -EPINEPHrine   20 mL Intradermal Once   midodrine   15 mg Oral TID WC   polyethylene glycol  17 g Oral Daily    Continuous Infusions:  sodium chloride       PRN Meds: acetaminophen  **OR** acetaminophen , albuterol , alum & mag hydroxide-simeth, HYDROmorphone  (DILAUDID ) injection, loperamide , ondansetron  **OR** ondansetron  (ZOFRAN ) IV, sodium chloride  flush, traMADol , traZODone   Physical Exam         Weak appearing Appears icteric Abdomen less distended Has LE edema Awake alert  Vital Signs: BP (!) 96/48 (BP Location: Left Arm)   Pulse 83   Temp 97.7 F (36.5 C) (Oral)   Resp 16   Ht 5' 7 (1.702 m)   Wt 78 kg   SpO2 94%   BMI 26.94 kg/m  SpO2: SpO2: 94 % O2 Device: O2 Device: Nasal Cannula O2 Flow Rate: O2 Flow Rate (L/min): 4 L/min  Intake/output summary:  Intake/Output Summary (Last 24 hours) at 09/12/2024 1052 Last data filed at 09/12/2024 1045 Gross per 24 hour  Intake 1272.8 ml  Output 100 ml  Net 1172.8 ml   LBM: Last BM Date : 09/10/24 (per patient) Baseline Weight: Weight: 78 kg Most recent weight: Weight: 78 kg       Palliative Assessment/Data:      Patient Active Problem  List   Diagnosis Date Noted   MDS/MPN (myelodysplastic/myeloproliferative neoplasms) (HCC) 09/12/2024   Pressure injury of skin 09/11/2024   Anasarca 09/10/2024   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   Goals of care, counseling/discussion 08/12/2024   Counseling and coordination of care 08/12/2024   Palliative care encounter 08/12/2024   Splenic infarct 08/11/2024   Counseling regarding advance care planning and goals of care 08/11/2024   Splenic sequestration  08/04/2024   Acute on chronic diastolic congestive heart failure (HCC) 08/03/2024   Hyperbilirubinemia 08/03/2024   Iron  overload 08/03/2024   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   Hardening of the aorta (main artery of the heart) 09/30/2023   Herpes zoster dermatitis 09/16/2023   Urinary retention 09/16/2023   Splenomegaly 09/02/2022   Anemia 09/01/2022   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   Symptomatic anemia 05/26/2022   Benign neoplasm of colon 08/27/2021   Thrombocytosis 08/27/2021   Screening for malignant neoplasm of prostate 08/27/2021   Leukocytosis 08/27/2021   Impaired fasting glucose 08/27/2021   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   Unspecified abnormal finding in specimens from other organs, systems and tissues 08/27/2021   Nonsustained ventricular tachycardia (HCC) 05/07/2021   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   Myeloproliferative disorder (HCC) 04/09/2021   Obstructive sleep apnea 02/24/2021   Arthritis of right knee    Pain in limb    Chest pain    Colon polyps    Palpitations    Gastroesophageal reflux disease without esophagitis    Hematochezia    Hyperglycemia    Hemorrhoids without complication    ED (erectile dysfunction) of organic origin    Ocular migraine    Atrial premature contractions    History of colonic polyps    Prediabetes    Residual hemorrhoidal skin tags    Unspecified hemorrhoids    Kidney stone    Chest discomfort     Palliative Care Assessment &  Plan   Patient Profile:  Fernando Lee is an 78 year old male patient with oncologic history MDS, JAK2 myeloproliferative neoplasm.  He was admitted on 09/10/2024 with complaints of worsening LE edema, penile edema, and splenic pain.   Assessment:  Functional decline, escalating symptom burden of abdominal pain Worsening anasarca Possible progression of his MPN/MDS syndrome.   Recommendations/Plan: Discussed with patient, and he has previously discussed with oncology regarding considering residential hospice, he is in agreement to proceed. Briefly discussed with patient regarding the type of care that can be provided in a residential hospice setting. Will request TOC input.  Add low dose IV Dilaudid  for severe pain.     Code Status:    Code Status Orders  (From admission, onward)           Start     Ordered   09/10/24 1528  Do not attempt resuscitation (DNR)- Limited -Do Not Intubate (DNI)  Continuous       Question Answer Comment  If pulseless and not breathing No CPR or chest compressions.   In Pre-Arrest Conditions (Patient Is Breathing and Has A Pulse) Do not intubate. Provide all appropriate non-invasive medical interventions. Avoid ICU transfer unless indicated or required.   Consent: Discussion documented in EHR or advanced directives reviewed      09/10/24 1527           Code Status History     Date Active Date Inactive Code Status Order ID Comments User Context   08/10/2024 1341 08/13/2024 1703 Limited: Do not attempt resuscitation (DNR) -DNR-LIMITED -Do Not Intubate/DNI  [id number]  Zella Katha HERO, MD ED   08/03/2024 1747 08/04/2024 1929 Full Code [id number]  Celinda Alm Lot, MD ED   09/16/2023 1352 09/21/2023 0040 Full Code [id number]  Celinda Alm Lot, MD ED   08/31/2022 2013 09/01/2022 2046 Full Code [id number]  Lonzell Emeline HERO, DO ED       Prognosis:  < 2 weeks  Discharge Planning: Hospice facility  Care plan  was discussed with patient and  oncology team.   Thank you for allowing the Palliative Medicine Team to assist in the care of this patient. High MDM     Greater than 50%  of this time was spent counseling and coordinating care related to the above assessment and plan.  Lonia Serve, MD  Please contact Palliative Medicine Team phone at [phone] for questions and concerns.

## 2024-09-12 NOTE — Progress Notes (Signed)
   09/12/24 0704  Vitals  BP (!) 79/46  MAP (mmHg) (!) 56  BP Location Left Arm  BP Method Automatic  Patient Position (if appropriate) Sitting  Pulse Rate 96  Pulse Rate Source Monitor   Notfiied Rapid and NP JD aware. See new orders. Will continue with plan of care.

## 2024-09-12 NOTE — Plan of Care (Signed)

## 2024-09-12 NOTE — TOC Initial Note (Addendum)
 Transition of Care [facility]) - Initial/Assessment Note   Patient Details  Name: Fernando Lee MRN: [id number] Date of Birth: 06-29-1946  [facility]) CM/SW Contact:    Fernando GORMAN Aran, LCSW Phone Number: 09/12/2024, 9:07 AM  Clinical Narrative: Patient is from home with wife. Palliative has been consulted. Care management following for discharge needs.  Addendum: Care management received consult for [name]. CSW confirmed with wife that she would like to proceed with [facility] evaluating the patient for [facility]. CSW made referral to Amy with [facility] to review.  Expected Discharge Plan:  (TBD) Barriers to Discharge: Continued Medical Work up  Expected Discharge Plan and Services In-house Referral: Clinical Social Work, Hospice / Palliative Care Living arrangements for the past 2 months: Single Family Home  Prior Living Arrangements/Services Living arrangements for the past 2 months: Single Family Home Lives with:: Spouse Patient language and need for interpreter reviewed:: Yes Do you feel safe going back to the place where you live?: Yes      Need for Family Participation in Patient Care: No (Comment) Care giver support system in place?: Yes (comment) Criminal Activity/Legal Involvement Pertinent to Current Situation/Hospitalization: No - Comment as needed  Activities of Daily Living ADL Screening (condition at time of admission) Independently performs ADLs?: Yes (appropriate for developmental age) Is the patient deaf or have difficulty hearing?: No Does the patient have difficulty seeing, even when wearing glasses/contacts?: No Does the patient have difficulty concentrating, remembering, or making decisions?: No  Emotional Assessment Orientation: : Oriented to Self, Oriented to Place, Oriented to  Time, Oriented to Situation Alcohol / Substance Use: Not Applicable Psych Involvement: No (comment)  Admission diagnosis:  Anasarca [R60.1] MDS  (myelodysplastic syndrome) (HCC) [D46.9] Patient Active Problem List   Diagnosis Date Noted   Pressure injury of skin 09/11/2024   Anasarca 09/10/2024   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   Goals of care, counseling/discussion 08/12/2024   Counseling and coordination of care 08/12/2024   Palliative care encounter 08/12/2024   Splenic infarct 08/11/2024   Counseling regarding advance care planning and goals of care 08/11/2024   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   Acute on chronic diastolic congestive heart failure (HCC) 08/03/2024   Hyperbilirubinemia 08/03/2024   Iron  overload 08/03/2024   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   Hardening of the aorta (main artery of the heart) 09/30/2023   Herpes zoster dermatitis 09/16/2023   Urinary retention 09/16/2023   Splenomegaly 09/02/2022   Anemia 09/01/2022   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   Symptomatic anemia 05/26/2022   Benign neoplasm of colon 08/27/2021   Thrombocytosis 08/27/2021   Screening for malignant neoplasm of prostate 08/27/2021   Leukocytosis 08/27/2021   Impaired fasting glucose 08/27/2021   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   Unspecified abnormal finding in specimens from other organs, systems and tissues 08/27/2021   Nonsustained ventricular tachycardia (HCC) 05/07/2021   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   Myeloproliferative disorder (HCC) 04/09/2021   Obstructive sleep apnea 02/24/2021   Arthritis of right knee    Pain in limb    Chest pain    Colon polyps    Palpitations    Gastroesophageal reflux disease without esophagitis    Hematochezia    Hyperglycemia    Hemorrhoids without complication    ED (erectile dysfunction) of organic origin    Ocular migraine    Atrial premature contractions    History of colonic polyps    Prediabetes    Residual hemorrhoidal skin tags  Unspecified hemorrhoids    Kidney stone    Chest discomfort    PCP:  Dayna Motto, DO Pharmacy:    [facility] [address], [place] - [address] [place] [address] Phone: [phone] Fax: [phone]  Social Drivers of Health (SDOH) Social History: SDOH Screenings   Food Insecurity: No Food Insecurity (09/10/2024)  Housing: Low Risk  (09/10/2024)  Transportation Needs: No Transportation Needs (09/10/2024)  Utilities: Not At Risk (09/10/2024)  Depression (PHQ2-9): Low Risk  (08/20/2024)  Recent Concern: Depression (PHQ2-9) - Medium Risk (08/10/2024)  Social Connections: Moderately Integrated (09/10/2024)  Tobacco Use: Medium Risk (09/10/2024)   SDOH Interventions:    Readmission Risk Interventions    09/12/2024    9:05 AM 08/13/2024   11:16 AM  Readmission Risk Prevention Plan  Transportation Screening Complete Complete  PCP or Specialist Appt within 5-7 Days  Complete  Home Care Screening  Complete  Medication Review (RN CM)  Complete  HRI or Home Care Consult Complete   Social Work Consult for Recovery Care Planning/Counseling Complete   Palliative Care Screening Complete   Medication Review Oceanographer) Complete

## 2024-09-13 ENCOUNTER — Encounter: Payer: Self-pay | Admitting: Hematology

## 2024-09-13 DIAGNOSIS — K766 Portal hypertension: Secondary | ICD-10-CM | POA: Diagnosis not present

## 2024-09-13 DIAGNOSIS — R601 Generalized edema: Secondary | ICD-10-CM | POA: Diagnosis not present

## 2024-09-13 DIAGNOSIS — Z1589 Genetic susceptibility to other disease: Secondary | ICD-10-CM

## 2024-09-13 DIAGNOSIS — K7290 Hepatic failure, unspecified without coma: Secondary | ICD-10-CM | POA: Diagnosis not present

## 2024-09-13 DIAGNOSIS — D469 Myelodysplastic syndrome, unspecified: Secondary | ICD-10-CM | POA: Diagnosis not present

## 2024-09-13 MED ORDER — MIDODRINE HCL 5 MG PO TABS: 15.0000 mg | ORAL_TABLET | Freq: Three times a day (TID) | ORAL | Status: DC

## 2024-09-13 MED ORDER — HEPARIN SOD (PORK) LOCK FLUSH 100 UNIT/ML IV SOLN
500.0000 [IU] | INTRAVENOUS | Status: AC | PRN
  Administered 2024-09-13: 500 [IU]
  Filled 2024-09-13: qty 5

## 2024-09-14 LAB — BODY FLUID CULTURE W GRAM STAIN
Culture: NO GROWTH
Gram Stain: NONE SEEN

## 2024-09-17 ENCOUNTER — Other Ambulatory Visit: Payer: Self-pay

## 2024-09-17 ENCOUNTER — Other Ambulatory Visit: Payer: Self-pay | Admitting: Hematology

## 2024-09-17 DIAGNOSIS — D469 Myelodysplastic syndrome, unspecified: Secondary | ICD-10-CM

## 2024-09-17 NOTE — Progress Notes (Signed)
 Daily Progress Note   Patient Name: Fernando Lee       Date: 08/27/2024 DOB: 05-11-1946  Age: 78 y.o. MRN#: [id number] Attending Physician: Cherlyn Labella, MD Primary Care Physician: Dayna Motto, DO Admit Date: 09/10/2024  Reason for Consultation/Follow-up: Establishing goals of care  Subjective:  Awake, appears in mild to moderate distress due to ongoing abdominal pain Sitting in chair wife present at bedside this a.m.  Length of Stay: 3  Current Medications: Scheduled Meds:   alfuzosin   10 mg Oral QHS   Chlorhexidine  Gluconate Cloth  6 each Topical Daily   docusate sodium   100 mg Oral Daily   lidocaine -EPINEPHrine   20 mL Intradermal Once   midodrine   15 mg Oral TID WC   polyethylene glycol  17 g Oral Daily    Continuous Infusions:  sodium chloride       PRN Meds: acetaminophen  **OR** acetaminophen , albuterol , alum & mag hydroxide-simeth, HYDROmorphone  (DILAUDID ) injection, loperamide , ondansetron  **OR** ondansetron  (ZOFRAN ) IV, sodium chloride  flush, traMADol , traZODone   Physical Exam         Weak appearing Appears icteric Abdomen less distended Has LE edema Awake alert  Vital Signs: BP (!) 90/45 (BP Location: Left Arm)   Pulse 90   Temp 97.8 F (36.6 C) (Axillary)   Resp 16   Ht 5' 7 (1.702 m)   Wt 78 kg   SpO2 93%   BMI 26.94 kg/m  SpO2: SpO2: 93 % O2 Device: O2 Device: Nasal Cannula O2 Flow Rate: O2 Flow Rate (L/min): 6 L/min  Intake/output summary:  Intake/Output Summary (Last 24 hours) at 08/20/2024 9090 Last data filed at 09/12/2024 1757 Gross per 24 hour  Intake 120 ml  Output 50 ml  Net 70 ml   LBM: Last BM Date : 09/10/24 Baseline Weight: Weight: 78 kg Most recent weight: Weight: 78 kg       Palliative Assessment/Data:      Patient  Active Problem List   Diagnosis Date Noted   MDS/MPN (myelodysplastic/myeloproliferative neoplasms) (HCC) 09/12/2024   Pressure injury of skin 09/11/2024   Anasarca 09/10/2024   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   Goals of care, counseling/discussion 08/12/2024   Counseling and coordination of care 08/12/2024   Palliative care encounter 08/12/2024   Splenic infarct 08/11/2024   Counseling regarding advance care planning and goals of care 08/11/2024  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   Acute on chronic diastolic congestive heart failure (HCC) 08/03/2024   Hyperbilirubinemia 08/03/2024   Iron  overload 08/03/2024   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   Hardening of the aorta (main artery of the heart) 09/30/2023   Herpes zoster dermatitis 09/16/2023   Urinary retention 09/16/2023   Splenomegaly 09/02/2022   Anemia 09/01/2022   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   Symptomatic anemia 05/26/2022   Benign neoplasm of colon 08/27/2021   Thrombocytosis 08/27/2021   Screening for malignant neoplasm of prostate 08/27/2021   Leukocytosis 08/27/2021   Impaired fasting glucose 08/27/2021   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   Unspecified abnormal finding in specimens from other organs, systems and tissues 08/27/2021   Nonsustained ventricular tachycardia (HCC) 05/07/2021   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   Myeloproliferative disorder (HCC) 04/09/2021   Obstructive sleep apnea 02/24/2021   Arthritis of right knee    Pain in limb    Chest pain    Colon polyps    Palpitations    Gastroesophageal reflux disease without esophagitis    Hematochezia    Hyperglycemia    Hemorrhoids without complication    ED (erectile dysfunction) of organic origin    Ocular migraine    Atrial premature contractions    History of colonic polyps    Prediabetes    Residual hemorrhoidal skin tags    Unspecified hemorrhoids    Kidney stone    Chest discomfort     Palliative Care  Assessment & Plan   Patient Profile:  Fernando Lee is an 78 year old male patient with oncologic history MDS, JAK2 myeloproliferative neoplasm.  He was admitted on 09/10/2024 with complaints of worsening LE edema, penile edema, and splenic pain.   Assessment:  Functional decline, escalating symptom burden of abdominal pain Worsening anasarca Possible progression of his MPN/MDS syndrome.   Recommendations/Plan: Discussed with patient and wife present at bedside, she has had a chance to review the consents for residential hospice and states that she is in full agreement to proceed with [facility].   low dose IV Dilaudid  for severe pain.     Code Status:    Code Status Orders  (From admission, onward)           Start     Ordered   09/10/24 1528  Do not attempt resuscitation (DNR)- Limited -Do Not Intubate (DNI)  Continuous       Question Answer Comment  If pulseless and not breathing No CPR or chest compressions.   In Pre-Arrest Conditions (Patient Is Breathing and Has A Pulse) Do not intubate. Provide all appropriate non-invasive medical interventions. Avoid ICU transfer unless indicated or required.   Consent: Discussion documented in EHR or advanced directives reviewed      09/10/24 1527           Code Status History     Date Active Date Inactive Code Status Order ID Comments User Context   08/10/2024 1341 08/13/2024 1703 Limited: Do not attempt resuscitation (DNR) -DNR-LIMITED -Do Not Intubate/DNI  [id number]  Zella Katha HERO, MD ED   08/03/2024 1747 08/04/2024 1929 Full Code [id number]  Celinda Alm Lot, MD ED   09/16/2023 1352 09/21/2023 0040 Full Code [id number]  Celinda Alm Lot, MD ED   08/31/2022 2013 09/01/2022 2046 Full Code [id number]  Lonzell Emeline HERO, DO ED       Prognosis:  < 2 weeks  Discharge Planning: Hospice facility  Care plan was discussed with patient and wife  Discussed with [facility] MD and oncology team on 11-26.   Thank you for  allowing the Palliative Medicine Team to assist in the care of this patient. High MDM     Greater than 50%  of this time was spent counseling and coordinating care related to the above assessment and plan.  Lonia Serve, MD  Please contact Palliative Medicine Team phone at [phone] for questions and concerns.

## 2024-09-17 NOTE — Progress Notes (Signed)
 There was a consult for flushing PAC with needle in. Dr. Cherlyn wanted to keep in needle on [facility]. Instilled 500 units of heparin  with PAC. HS [employer]

## 2024-09-17 NOTE — Discharge Summary (Signed)
Physician Discharge Summary   Patient: Fernando Lee MRN: [id number] DOB: 08-12-1946  Admit date:     09/10/2024  Discharge date: 08/19/2024  Discharge Physician: Elgie Butter   PCP: Dayna Motto, DO   Recommendations at discharge:  Please follow up with hospice MD as recommended.   Discharge Diagnoses: Principal Problem:   Anasarca Active Problems:   Symptomatic anemia   MDS (myelodysplastic syndrome) (HCC)   JAK2 gene mutation   Pressure injury of skin   MDS/MPN (myelodysplastic/myeloproliferative neoplasms) (HCC)  Resolved Problems:   * No resolved hospital problems. *  Hospital Course: Fernando Lee is an 78 y.o. male past medical history of JAK2/MDS, splenomegaly, HFpEF anemia and thrombocytopenia followed by hematology with multiple transfusions over the last several weeks, with chronic leukocytosis who has refused hospice care during his last admission admitted to the hospital for acute kidney injury and worsening anasarca. Relates that for the last couple of weeks he is developing nonproductive gradual cough and abdominal discomfort with worsening lower extremity edema.  He was admitted for acute decompensated cirrhosis.   Assessment and Plan:  Anasarca with bilateral pleural effusions and large volume ascitics   Multifactorial in the setting of decompensated cirrhosis and portal hypertension IR consulted for therapeutic thoracentesis and paracentesis but unfortunately thoracentesis could not be done due to hypotension. Palliative care consulted in view of his declining health, deconditioning and worsening of cirrhosis Patient and his wife have opted for comfort measures and beacon placement.       History of chronic heart failure with preserved left ventricular ejection fraction with grade 2 diastolic dysfunction Unfortunately unable to use any diuretics due to profound hypotension and AKI       Acute kidney injury in the setting of hypotension probably heading  towards hepatorenal syndrome. Patient received IV albumin  without any improvement in renal parameters.  He was started on midodrine  15 mg 3 times daily.     History of MDS with Jak 2 mutation Dr. Onesimo on board     Wound 09/10/24 1830 Pressure Injury Buttocks Medial Stage 1 -  Intact skin with non-blanchable redness of a localized area usually over a bony prominence. (Active)  Local wound care           Consultants: oncology Palliative  Procedures performed: paracentesis.   Disposition: Hospice care Diet recommendation:  Discharge Diet Orders (From admission, onward)     Start     Ordered   08/19/2024 0000  Diet - low sodium heart healthy        08/27/2024 0943           Regular diet DISCHARGE MEDICATION: Allergies as of 08/30/2024       Reactions   Lactose Intolerance (gi) Diarrhea        Medication List     STOP taking these medications    acetaminophen  325 MG tablet Commonly known as: TYLENOL    B COMPLEX PO   Deferasirox  180 MG Tabs Commonly known as: Jadenu    FLAXSEED OIL PO   furosemide  20 MG tablet Commonly known as: LASIX    Ojjaara  100 MG tablet Generic drug: momelotinib dihydrochloride    ondansetron  8 MG tablet Commonly known as: ZOFRAN    potassium chloride  SA 20 MEQ tablet Commonly known as: KLOR-CON  M   prochlorperazine  10 MG tablet Commonly known as: COMPAZINE    TURMERIC (CURCUMIN) PO   TYLENOL  500 MG tablet Generic drug: acetaminophen    valACYclovir  500 MG tablet Commonly known as: VALTREX    VITAMIN C  PO  VITAMIN D -3 PO       TAKE these medications    alfuzosin  10 MG 24 hr tablet Commonly known as: UROXATRAL  Take 10 mg by mouth at bedtime.   docusate sodium  100 MG capsule Commonly known as: COLACE Take 100 mg by mouth daily.   midodrine  5 MG tablet Commonly known as: PROAMATINE  Take 3 tablets (15 mg total) by mouth 3 (three) times daily with meals.        Discharge Exam: Filed Weights   09/10/24 1138   Weight: 78 kg   General exam: ill appearing ,  Respiratory system: diminished at bases.  Cardiovascular system: S1 & S2 heard, RRR.  Gastrointestinal system: Abdomen is distended Central nervous system: Alert and  oriented.  Extremities: leg edema.  Skin: No rashes,     Condition at discharge: poor  The results of significant diagnostics from this hospitalization (including imaging, microbiology, ancillary and laboratory) are listed below for reference.   Imaging Studies: IR Paracentesis Result Date: 09/11/2024 INDICATION: Patient with history of myelodysplasia/JAK2 positive myeloproliferative neoplasm, splenomegaly, cirrhosis with portal hypertension, chronic diastolic dysfunction, acute kidney injury, ascites, pleural effusions, undergoing evaluation for home hospice care; request received for diagnostic and therapeutic paracentesis. EXAM: ULTRASOUND GUIDED DIAGNOSTIC AND THERAPEUTIC PARACENTESIS MEDICATIONS: 6 mL 1% lidocaine  with epinephrine  to skin and subcutaneous tissue COMPLICATIONS: None immediate. PROCEDURE: Informed written consent was obtained from the patient after a discussion of the risks, benefits and alternatives to treatment. A timeout was performed prior to the initiation of the procedure. Initial ultrasound scanning demonstrates a small to moderate amount of ascites within the right lower abdominal quadrant. The right lower abdomen was prepped and draped in the usual sterile fashion. 1% lidocaine  with epinephrine  was used for local anesthesia. Following this, a 19 gauge, 10-cm, Yueh catheter was introduced. An ultrasound image was saved for documentation purposes. The paracentesis was performed. The catheter was removed and a dressing was applied. The patient tolerated the procedure well without immediate post procedural complication. Patient received post-procedure intravenous albumin ; see nursing notes for details. FINDINGS: A total of approximately 2.1 liters of turbid,  yellow fluid was removed. Samples were sent to the laboratory as requested by the clinical team. IMPRESSION: Successful ultrasound-guided diagnostic and therapeutic paracentesis yielding 2.1 L of peritoneal fluid. Performed by Franky Rakers, PA-C PLAN: If the patient eventually requires >/=2 paracenteses in a 30 day period, candidacy for formal evaluation by the [facility] will be assessed. Thom Hall, MD [facility] Electronically Signed   By: Thom Hall M.D.   On: 09/11/2024 17:51   CT ABDOMEN PELVIS WO CONTRAST Result Date: 09/10/2024 CLINICAL DATA:  Abdominal distension, pain, ascites. Recent splenic infarct. EXAM: CT ABDOMEN AND PELVIS WITHOUT CONTRAST TECHNIQUE: Multidetector CT imaging of the abdomen and pelvis was performed following the standard protocol without IV contrast. RADIATION DOSE REDUCTION: This exam was performed according to the departmental dose-optimization program which includes automated exposure control, adjustment of the mA and/or kV according to patient size and/or use of iterative reconstruction technique. COMPARISON:  08/10/2024. FINDINGS: Lower chest: Moderate right and large left pleural effusions with compressive atelectasis in both lower lobes. New peribronchovascular ground-glass bilaterally. Central line is seen in the SVC, terminating in the high right atrium. Partially imaged low-attenuation lesion in the prevascular space measures 2.3 x 2.6 cm. Small pericardial effusion. Heart is enlarged. Atherosclerotic calcification of the aorta, aortic valve and coronary arteries. Distal esophagus is grossly unremarkable. Hepatobiliary: Liver  is diffusely increased in density and is enlarged, measuring 26.6 cm. Margin is slightly irregular. Gallstones. No biliary ductal dilatation. Pancreas: Negative. Spleen: Enlarged, 20.8 cm. Adrenals/Urinary Tract: Adrenal glands are  unremarkable. Small bilateral renal stones. Kidneys are otherwise grossly unremarkable. Ureters are decompressed. 8 mm stone at the left ureterovesical junction. 8 mm dependent stone in the bladder. Stomach/Bowel: Moderate hiatal hernia. Stomach, small bowel and colon are otherwise unremarkable. Appendix is not readily visualized. Vascular/Lymphatic: Atherosclerotic calcification of the aorta. Gastroesophageal varices. Retroperitoneal lymph nodes measure up to 9 mm in the aortocaval station (2/51). Reproductive: Prostate is enlarged. Other: Large ascites.  Diffuse body wall edema. Musculoskeletal: Degenerative changes in the spine. IMPRESSION: 1. 8 mm stone at the left ureterovesical junction without hydronephrosis. 2. Moderate right and large left pleural effusions with compressive atelectasis in both lower lobes. New peribronchovascular ground-glass suggests pulmonary edema. 3. Small pericardial effusion. 4. Large ascites. 5. Cirrhosis with portal hypertension. Increased density of the liver can be seen with iron  deposition disease. 6. Cholelithiasis. 7. Small bilateral renal stones. 8. Moderate hiatal hernia. 9. 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 If further evaluation is desired, nonemergent CT chest without and with contrast should be considered. 10. Enlarged prostate. 11. Aortic atherosclerosis (ICD10-I70.0). Coronary artery calcification. Electronically Signed   By: Newell Eke M.D.   On: 09/10/2024 16:08   DG Chest Port 1 View Result Date: 09/10/2024 CLINICAL DATA:  Shortness of breath. EXAM: PORTABLE CHEST 1 VIEW COMPARISON:  Chest CT dated 08/03/2024 FINDINGS: Right-sided Port-A-Cath with tip over the right atrium. There is cardiomegaly with vascular congestion and edema. Small bilateral pleural effusions and bibasilar atelectasis. No pneumothorax. No acute osseous pathology. IMPRESSION: Cardiomegaly with findings of CHF.  Electronically Signed   By: Vanetta Chou M.D.   On: 09/10/2024 13:00    Microbiology: Results for orders placed or performed during the hospital encounter of 09/10/24  Resp panel by RT-PCR (RSV, Flu A&B, Covid) Anterior Nasal Swab     Status: None   Collection Time: 09/10/24 12:06 PM   Specimen: Anterior Nasal Swab  Result Value Ref Range Status   SARS Coronavirus 2 by RT PCR NEGATIVE NEGATIVE Final    Comment: (NOTE) SARS-CoV-2 target nucleic acids are NOT DETECTED.  The SARS-CoV-2 RNA is generally detectable in upper respiratory specimens during the acute phase of infection. The lowest concentration of SARS-CoV-2 viral copies this assay can detect is 138 copies/mL. A negative result does not preclude SARS-Cov-2 infection and should not be used as the sole basis for treatment or other patient management decisions. A negative result may occur with  improper specimen collection/handling, submission of specimen other than nasopharyngeal swab, presence of viral mutation(s) within the areas targeted by this assay, and inadequate number of viral copies(<138 copies/mL). A negative result must be combined with clinical observations, patient history, and epidemiological information. The expected result is Negative.  Fact Sheet for Patients:  bloggercourse.com  Fact Sheet for Healthcare Providers:  seriousbroker.it  This test is no t yet approved or cleared by the [place]  FDA and  has been authorized for detection and/or diagnosis of SARS-CoV-2 by FDA under an Emergency Use Authorization (EUA). This EUA will remain  in effect (meaning this test can be used) for the duration of the COVID-19 declaration under Section 564(b)(1) of the Act, 21 U.S.C.section 360bbb-3(b)(1), unless the authorization is terminated  or revoked sooner.       Influenza A by PCR NEGATIVE NEGATIVE Final  Influenza B by PCR NEGATIVE NEGATIVE Final     Comment: (NOTE) The Xpert Xpress SARS-CoV-2/FLU/RSV plus assay is intended as an aid in the diagnosis of influenza from Nasopharyngeal swab specimens and should not be used as a sole basis for treatment. Nasal washings and aspirates are unacceptable for Xpert Xpress SARS-CoV-2/FLU/RSV testing.  Fact Sheet for Patients: bloggercourse.com  Fact Sheet for Healthcare Providers: seriousbroker.it  This test is not yet approved or cleared by the [place]  FDA and has been authorized for detection and/or diagnosis of SARS-CoV-2 by FDA under an Emergency Use Authorization (EUA). This EUA will remain in effect (meaning this test can be used) for the duration of the COVID-19 declaration under Section 564(b)(1) of the Act, 21 U.S.C. section 360bbb-3(b)(1), unless the authorization is terminated or revoked.     Resp Syncytial Virus by PCR NEGATIVE NEGATIVE Final    Comment: (NOTE) Fact Sheet for Patients: bloggercourse.com  Fact Sheet for Healthcare Providers: seriousbroker.it  This test is not yet approved or cleared by the [place]  FDA and has been authorized for detection and/or diagnosis of SARS-CoV-2 by FDA under an Emergency Use Authorization (EUA). This EUA will remain in effect (meaning this test can be used) for the duration of the COVID-19 declaration under Section 564(b)(1) of the Act, 21 U.S.C. section 360bbb-3(b)(1), unless the authorization is terminated or revoked.  Performed at [facility], [address], [place], [place] [address]   Body fluid culture w Gram Stain     Status: None (Preliminary result)   Collection Time: 09/11/24 12:09 PM   Specimen: Peritoneal Washings; Body Fluid  Result Value Ref Range Status   Specimen Description   Final    PERITONEAL Performed at [facility], [address] [place], [place]  [address]    Special Requests   Final    NONE Performed at [facility], [address] [place], [place] [address]    Gram Stain NO WBC SEEN NO ORGANISMS SEEN   Final   Culture   Final    NO GROWTH < 24 HOURS Performed at [facility], [address] [place], [place] [address]    Report Status PENDING  Incomplete   *Note: Due to a large number of results and/or encounters for the requested time period, some results have not been displayed. A complete set of results can be found in Results Review.    Labs: CBC: Recent Labs  Lab 09/10/24 0953 09/10/24 1206 09/10/24 2012 09/11/24 0453  WBC 46.4* 41.9*  --  40.5*  NEUTROABS 19.5* 15.1*  --   --   HGB 8.2* 7.9* 7.9* 8.0*  HCT 24.4* 24.1* 24.1* 24.1*  MCV 104.7* 107.6*  --  101.3*  PLT 38* 36*  --  32*   Basic Metabolic Panel: Recent Labs  Lab 09/10/24 1206 09/11/24 0453  NA 129* 131*  K 5.0 5.0  CL 97* 98  CO2 23 26  GLUCOSE 128* 137*  BUN 50* 52*  CREATININE 1.73* 1.80*  CALCIUM  7.8* 7.9*   Liver Function Tests: Recent Labs  Lab 09/10/24 1206 09/11/24 0453  AST 48* 56*  ALT 145* 158*  ALKPHOS 482* 509*  BILITOT 3.2* 4.5*  PROT 4.4* 4.2*  ALBUMIN  2.7* 2.9*   CBG: No results for input(s): GLUCAP in the last 168 hours.  Discharge time spent: 39 minutes.   Signed: Elgie Butter, MD [facility] 08/27/2024

## 2024-09-17 NOTE — TOC Transition Note (Signed)
 Transition of Care [facility]) - Discharge Note   Patient Details  Name: Fernando Lee MRN: [id number] Date of Birth: 1946/10/03  Transition of Care [facility]) CM/SW Contact:  Alfonse JONELLE Rex, RN Phone Number: 09/10/2024, 1:59 PM   Clinical Narrative:   DC order to [facility]. [facility] for transport. No further INPT CM needs at this time     Final next level of care: Hospice Medical Facility Barriers to Discharge: Barriers Resolved   Patient Goals and CMS Choice Patient states their goals for this hospitalization and ongoing recovery are:: To go to [facility] for residential hospice          Discharge Placement                Patient to be transferred to facility by: [facility] Name of family member notified: [name] Patient and family notified of of transfer: 09/12/24  Discharge Plan and Services Additional resources added to the After Visit Summary for   In-house Referral: Clinical Social Work, Hospice / Palliative Care              DME Arranged: N/A DME Agency: NA       HH Arranged: NA HH Agency: NA        Social Drivers of Health (SDOH) Interventions SDOH Screenings   Food Insecurity: No Food Insecurity (09/10/2024)  Housing: Low Risk  (09/10/2024)  Transportation Needs: No Transportation Needs (09/10/2024)  Utilities: Not At Risk (09/10/2024)  Depression (PHQ2-9): Low Risk  (08/20/2024)  Recent Concern: Depression (PHQ2-9) - Medium Risk (08/10/2024)  Social Connections: Moderately Integrated (09/10/2024)  Tobacco Use: Medium Risk (09/10/2024)     Readmission Risk Interventions    09/12/2024    9:05 AM 08/13/2024   11:16 AM  Readmission Risk Prevention Plan  Transportation Screening Complete Complete  PCP or Specialist Appt within 5-7 Days  Complete  Home Care Screening  Complete  Medication Review (RN CM)  Complete  HRI or Home Care Consult Complete   Social Work Consult for Recovery Care Planning/Counseling Complete   Palliative Care Screening  Complete   Medication Review Oceanographer) Complete

## 2024-09-17 NOTE — Progress Notes (Addendum)
 Discharge instructions placed in packet for receiving facility, report called to [facility] Farris RN). No home meds sent to pharmacy per patients wife and per pharmacy. Port staying accessed per [facility] request and MD order, IV team order placed to flush and cap port before discharge.

## 2024-09-17 NOTE — Plan of Care (Signed)

## 2024-09-17 DEATH — deceased

## 2024-09-18 ENCOUNTER — Other Ambulatory Visit (HOSPITAL_COMMUNITY): Payer: Self-pay

## 2025-03-29 ENCOUNTER — Encounter (INDEPENDENT_AMBULATORY_CARE_PROVIDER_SITE_OTHER): Admitting: Ophthalmology
